# Patient Record
Sex: Male | Born: 1955 | Race: Black or African American | Hispanic: No | Marital: Single | Smoking: Former smoker
Health system: Southern US, Community
[De-identification: ages and names within clinical notes are randomized; demographics above are authoritative.]

## PROBLEM LIST (undated history)

## (undated) DIAGNOSIS — Z9989 Dependence on other enabling machines and devices: Secondary | ICD-10-CM

## (undated) DIAGNOSIS — G4730 Sleep apnea, unspecified: Secondary | ICD-10-CM

## (undated) DIAGNOSIS — E785 Hyperlipidemia, unspecified: Secondary | ICD-10-CM

## (undated) DIAGNOSIS — E1142 Type 2 diabetes mellitus with diabetic polyneuropathy: Secondary | ICD-10-CM

## (undated) DIAGNOSIS — M109 Gout, unspecified: Secondary | ICD-10-CM

## (undated) DIAGNOSIS — K219 Gastro-esophageal reflux disease without esophagitis: Secondary | ICD-10-CM

## (undated) DIAGNOSIS — I2510 Atherosclerotic heart disease of native coronary artery without angina pectoris: Secondary | ICD-10-CM

## (undated) DIAGNOSIS — I4891 Unspecified atrial fibrillation: Secondary | ICD-10-CM

## (undated) DIAGNOSIS — G4733 Obstructive sleep apnea (adult) (pediatric): Secondary | ICD-10-CM

## (undated) DIAGNOSIS — E119 Type 2 diabetes mellitus without complications: Secondary | ICD-10-CM

## (undated) DIAGNOSIS — I10 Essential (primary) hypertension: Secondary | ICD-10-CM

## (undated) HISTORY — DX: Atherosclerotic heart disease of native coronary artery without angina pectoris: I25.10

## (undated) HISTORY — DX: Hyperlipidemia, unspecified: E78.5

## (undated) HISTORY — DX: Type 2 diabetes mellitus with diabetic polyneuropathy: E11.42

## (undated) HISTORY — PX: CERVICAL DISC SURGERY: SHX588

## (undated) HISTORY — PX: NECK SURGERY: SHX720

## (undated) HISTORY — DX: Sleep apnea, unspecified: G47.30

## (undated) HISTORY — DX: Dependence on other enabling machines and devices: Z99.89

## (undated) HISTORY — PX: TONSILLECTOMY: SUR1361

## (undated) HISTORY — PX: ABLATION: SHX5711

## (undated) HISTORY — DX: Gout, unspecified: M10.9

## (undated) HISTORY — PX: COLONOSCOPY: SHX174

## (undated) HISTORY — PX: ESOPHAGOGASTRODUODENOSCOPY: SHX1529

## (undated) HISTORY — DX: Obstructive sleep apnea (adult) (pediatric): G47.33

## (undated) HISTORY — DX: Gastro-esophageal reflux disease without esophagitis: K21.9

---

## 2009-10-24 HISTORY — PX: AORTIC VALVE REPLACEMENT: SHX41

## 2016-05-02 DIAGNOSIS — Z952 Presence of prosthetic heart valve: Secondary | ICD-10-CM | POA: Insufficient documentation

## 2016-05-02 DIAGNOSIS — R0609 Other forms of dyspnea: Secondary | ICD-10-CM | POA: Insufficient documentation

## 2016-05-02 DIAGNOSIS — R600 Localized edema: Secondary | ICD-10-CM | POA: Insufficient documentation

## 2016-05-02 DIAGNOSIS — R0600 Dyspnea, unspecified: Secondary | ICD-10-CM | POA: Insufficient documentation

## 2016-06-02 DIAGNOSIS — Z8679 Personal history of other diseases of the circulatory system: Secondary | ICD-10-CM | POA: Insufficient documentation

## 2016-06-02 DIAGNOSIS — Z9989 Dependence on other enabling machines and devices: Secondary | ICD-10-CM | POA: Insufficient documentation

## 2016-06-02 DIAGNOSIS — E119 Type 2 diabetes mellitus without complications: Secondary | ICD-10-CM | POA: Insufficient documentation

## 2016-06-02 DIAGNOSIS — G4733 Obstructive sleep apnea (adult) (pediatric): Secondary | ICD-10-CM | POA: Insufficient documentation

## 2016-07-19 DIAGNOSIS — I483 Typical atrial flutter: Secondary | ICD-10-CM | POA: Insufficient documentation

## 2016-09-05 DIAGNOSIS — I10 Essential (primary) hypertension: Secondary | ICD-10-CM | POA: Insufficient documentation

## 2016-09-05 DIAGNOSIS — Z23 Encounter for immunization: Secondary | ICD-10-CM | POA: Insufficient documentation

## 2016-09-05 DIAGNOSIS — E7800 Pure hypercholesterolemia, unspecified: Secondary | ICD-10-CM | POA: Insufficient documentation

## 2016-09-05 DIAGNOSIS — Z0000 Encounter for general adult medical examination without abnormal findings: Secondary | ICD-10-CM | POA: Insufficient documentation

## 2016-10-09 DIAGNOSIS — R778 Other specified abnormalities of plasma proteins: Secondary | ICD-10-CM | POA: Insufficient documentation

## 2016-10-09 DIAGNOSIS — R7989 Other specified abnormal findings of blood chemistry: Secondary | ICD-10-CM

## 2016-10-10 DIAGNOSIS — I5021 Acute systolic (congestive) heart failure: Secondary | ICD-10-CM | POA: Insufficient documentation

## 2016-11-03 ENCOUNTER — Emergency Department (HOSPITAL_COMMUNITY): Payer: BLUE CROSS/BLUE SHIELD

## 2016-11-03 ENCOUNTER — Encounter (HOSPITAL_COMMUNITY): Payer: Self-pay

## 2016-11-03 DIAGNOSIS — Z87891 Personal history of nicotine dependence: Secondary | ICD-10-CM | POA: Diagnosis not present

## 2016-11-03 DIAGNOSIS — E119 Type 2 diabetes mellitus without complications: Secondary | ICD-10-CM | POA: Diagnosis not present

## 2016-11-03 DIAGNOSIS — Z7984 Long term (current) use of oral hypoglycemic drugs: Secondary | ICD-10-CM | POA: Diagnosis not present

## 2016-11-03 DIAGNOSIS — I10 Essential (primary) hypertension: Secondary | ICD-10-CM | POA: Insufficient documentation

## 2016-11-03 DIAGNOSIS — Z7901 Long term (current) use of anticoagulants: Secondary | ICD-10-CM | POA: Diagnosis not present

## 2016-11-03 DIAGNOSIS — Z79899 Other long term (current) drug therapy: Secondary | ICD-10-CM | POA: Insufficient documentation

## 2016-11-03 DIAGNOSIS — R0602 Shortness of breath: Secondary | ICD-10-CM | POA: Diagnosis not present

## 2016-11-03 LAB — CBC
HCT: 43.3 % (ref 39.0–52.0)
Hemoglobin: 14.9 g/dL (ref 13.0–17.0)
MCH: 30.2 pg (ref 26.0–34.0)
MCHC: 34.4 g/dL (ref 30.0–36.0)
MCV: 87.7 fL (ref 78.0–100.0)
Platelets: 270 10*3/uL (ref 150–400)
RBC: 4.94 MIL/uL (ref 4.22–5.81)
RDW: 13.2 % (ref 11.5–15.5)
WBC: 7 10*3/uL (ref 4.0–10.5)

## 2016-11-03 LAB — BASIC METABOLIC PANEL
Anion gap: 9 (ref 5–15)
BUN: 13 mg/dL (ref 6–20)
CO2: 24 mmol/L (ref 22–32)
Calcium: 9.3 mg/dL (ref 8.9–10.3)
Chloride: 106 mmol/L (ref 101–111)
Creatinine, Ser: 1.3 mg/dL — ABNORMAL HIGH (ref 0.61–1.24)
GFR calc Af Amer: >60 mL/min (ref >60)
GFR calc non Af Amer: 57 mL/min — ABNORMAL LOW (ref >60)
Glucose, Bld: 125 mg/dL — ABNORMAL HIGH (ref 65–99)
Potassium: 4.3 mmol/L (ref 3.5–5.1)
Sodium: 139 mmol/L (ref 135–145)

## 2016-11-03 LAB — I-STAT TROPONIN, ED: Troponin i, poc: 0.06 ng/mL (ref 0.00–0.08)

## 2016-11-03 NOTE — ED Triage Notes (Signed)
Pt reports SOB and lightheadedness that started yesterday, no LOC but reports he felt like he was going to pass out today around 2100 while walking. He states he has had "a lingering cough." Denies any pain.He reports recent ablation on Dec 15.

## 2016-11-04 ENCOUNTER — Emergency Department (HOSPITAL_COMMUNITY)
Admission: EM | Admit: 2016-11-04 | Discharge: 2016-11-04 | Disposition: A | Discharge status: Home / Self Care | Payer: BLUE CROSS/BLUE SHIELD | Attending: Emergency Medicine | Admitting: Emergency Medicine

## 2016-11-04 DIAGNOSIS — R0602 Shortness of breath: Secondary | ICD-10-CM

## 2016-11-04 HISTORY — DX: Unspecified atrial fibrillation: I48.91

## 2016-11-04 HISTORY — DX: Type 2 diabetes mellitus without complications: E11.9

## 2016-11-04 HISTORY — DX: Essential (primary) hypertension: I10

## 2016-11-04 LAB — URINALYSIS, ROUTINE W REFLEX MICROSCOPIC
Bilirubin Urine: NEGATIVE
Glucose, UA: NEGATIVE mg/dL
Hgb urine dipstick: NEGATIVE
Ketones, ur: NEGATIVE mg/dL
Leukocytes, UA: NEGATIVE
Nitrite: NEGATIVE
Protein, ur: NEGATIVE mg/dL
Specific Gravity, Urine: 1.019 (ref 1.005–1.030)
pH: 6 (ref 5.0–8.0)

## 2016-11-04 LAB — D-DIMER, QUANTITATIVE: D-Dimer, Quant: 0.31 ug/mL-FEU (ref 0.00–0.50)

## 2016-11-04 LAB — I-STAT TROPONIN, ED: Troponin i, poc: 0.06 ng/mL (ref 0.00–0.08)

## 2016-11-04 NOTE — ED Provider Notes (Signed)
[facility] DEPT Provider Note   CSN: [id number] Arrival date & time: 11/03/16  2159  By signing my name below, I, Delton Prairie, attest that this documentation has been prepared under the direction and in the presence of Jola Schmidt, MD  Electronically Signed: Delton Prairie, ED Scribe. 11/04/16. 4:14 AM.   History   Chief Complaint Chief Complaint  Patient presents with  . Shortness of Breath  . Near Syncope   The history is provided by the patient. No language interpreter was used.   HPI Comments:  Frank Berg is a 61 y.o. male, with a hx of a-fib, DM and HTN, who presents to the Emergency Department complaining of sudden onset, persistent SOB with exertion x 2 days. Pt also reports a persistent cough x 3 weeks, lightheadedness x 2 days and chest discomfort secondary to coughing. He states he has a cardiac ablation 3 weeks ago. Pt denies a hx of blood clot in his heart or lungs and leg swelling.   Past Medical History:  Diagnosis Date  . Atrial fibrillation ([place])   . Diabetes mellitus without complication ([place])   . Hypertension     There are no active problems to display for this patient.   Past Surgical History:  Procedure Laterality Date  . ABLATION    . AORTIC VALVE REPLACEMENT  2011  . NECK SURGERY     spinal stenosis       Home Medications    Prior to Admission medications   Medication Sig Start Date End Date Taking? Authorizing Provider  amiodarone (PACERONE) 200 MG tablet Take 200 mg by mouth 2 (two) times daily. 10/04/16  Yes Historical Provider, MD  doxycycline (VIBRA-TABS) 100 MG tablet Take 100 mg by mouth 2 (two) times daily. 10/24/16  Yes Historical Provider, MD  furosemide (LASIX) 20 MG tablet Take 20 mg by mouth daily. 10/10/16  Yes Historical Provider, MD  lansoprazole (PREVACID) 30 MG capsule Take 30 mg by mouth daily at 12 noon.   Yes Historical Provider, MD  magnesium oxide (MAG-OX) 400 (241.3 Mg) MG tablet Take 400 mg by mouth daily. 10/27/16   Yes Historical Provider, MD  metFORMIN (GLUCOPHAGE) 500 MG tablet Take 500 mg by mouth 2 (two) times daily with a meal.   Yes Historical Provider, MD  metoprolol succinate (TOPROL-XL) 50 MG 24 hr tablet Take 50 mg by mouth daily. 08/31/16  Yes Historical Provider, MD  potassium chloride (MICRO-K) 10 MEQ CR capsule Take 10 mEq by mouth 2 (two) times daily. 10/13/16  Yes Historical Provider, MD  PROAIR HFA 108 (90 Base) MCG/ACT inhaler Inhale 1-2 puffs into the lungs every 6 (six) hours as needed for wheezing.  10/24/16  Yes Historical Provider, MD  rosuvastatin (CRESTOR) 5 MG tablet Take 5 mg by mouth every evening. 08/26/16  Yes Historical Provider, MD  valsartan (DIOVAN) 80 MG tablet Take 80 mg by mouth daily. 08/24/16  Yes Historical Provider, MD  XARELTO 20 MG TABS tablet Take 20 mg by mouth every evening. 08/27/16  Yes Historical Provider, MD    Family History No family history on file.  Social History Social History  Substance Use Topics  . Smoking status: Former Smoker    Quit date: 11/03/1981  . Smokeless tobacco: Never Used  . Alcohol use Yes     Allergies   Penicillins   Review of Systems Review of Systems 10 systems reviewed and all are negative for acute change except as noted in the HPI.  Physical Exam Updated Vital  Signs BP (!) 164/107   Pulse (!) 51   Temp 98.1 F (36.7 C)   Resp 17   Ht 6\' 2"  (1.88 m)   Wt 250 lb (113.4 kg)   SpO2 96%   BMI 32.10 kg/m   Physical Exam  Constitutional: He is oriented to person, place, and time. He appears well-developed and well-nourished.  HENT:  Head: Normocephalic and atraumatic.  Eyes: EOM are normal.  Neck: Normal range of motion.  Cardiovascular: Normal rate, regular rhythm, normal heart sounds and intact distal pulses.   Pulmonary/Chest: Effort normal and breath sounds normal. No respiratory distress.  Abdominal: Soft. He exhibits no distension. There is no tenderness.  Musculoskeletal: Normal range of motion.    Neurological: He is alert and oriented to person, place, and time.  Skin: Skin is warm and dry.  Psychiatric: He has a normal mood and affect. Judgment normal.  Nursing note and vitals reviewed.    ED Treatments / Results  DIAGNOSTIC STUDIES:  Oxygen Saturation is 100% on RA, normal by my interpretation.    COORDINATION OF CARE:  4:13 AM Discussed treatment plan with pt at bedside and pt agreed to plan.  Labs (all labs ordered are listed, but only abnormal results are displayed) Labs Reviewed  BASIC METABOLIC PANEL - Abnormal; Notable for the following:       Result Value   Glucose, Bld 125 (*)    Creatinine, Ser 1.30 (*)    GFR calc non Af Amer 57 (*)    All other components within normal limits  CBC  URINALYSIS, ROUTINE W REFLEX MICROSCOPIC  D-DIMER, QUANTITATIVE (NOT AT [facility])  I-STAT TROPOININ, ED  I-STAT TROPOININ, ED  CBG MONITORING, ED    EKG  EKG Interpretation  Date/Time:  Friday November 04 2016 04:09:10 EST Ventricular Rate:  48 PR Interval:    QRS Duration: 156 QT Interval:  514 QTC Calculation: 460 R Axis:   55 Text Interpretation:  Sinus bradycardia Right bundle branch block No significant change was found Confirmed by Nixon Sparr  MD, Lennette Bihari ([id number]) on 11/04/2016 8:35:06 AM       Radiology Dg Chest 2 View  Result Date: 11/03/2016 CLINICAL DATA:  Acute onset of shortness of breath. Subacute onset of cough and congestion. Initial encounter. EXAM: CHEST  2 VIEW COMPARISON:  None. FINDINGS: The lungs are well-aerated and clear. There is no evidence of focal opacification, pleural effusion or pneumothorax. The heart is borderline enlarged. The patient is status post median sternotomy. An aortic valve replacement is noted. No acute osseous abnormalities are seen. IMPRESSION: Borderline cardiomegaly.  Lungs remain grossly clear. Electronically Signed   By: Garald Balding M.D.   On: 11/03/2016 22:56    Procedures Procedures (including critical care  time)  Medications Ordered in ED Medications - No data to display   Initial Impression / Assessment and Plan / ED Course  I have reviewed the triage vital signs and the nursing notes.  Pertinent labs & imaging results that were available during my care of the patient were reviewed by me and considered in my medical decision making (see chart for details).  Clinical Course     At time of discharge the patient feels much better.  EKG is without ischemic changes.  Troponin negative 2.  Ambulatory in the hall without any hypoxia.  D-dimer negative.  Overall well-appearing.  Outpatient primary care follow-up.  He understands to return to the ER for new or worsening symptoms.  This likely bronchitis.  Final  Clinical Impressions(s) / ED Diagnoses   Final diagnoses:  None    New Prescriptions Discharge Medication List as of 11/04/2016  5:28 AM     I personally performed the services described in this documentation, which was scribed in my presence. The recorded information has been reviewed and is accurate.        Jola Schmidt, MD 11/04/16 [phone]

## 2017-05-22 DIAGNOSIS — M109 Gout, unspecified: Secondary | ICD-10-CM | POA: Insufficient documentation

## 2017-05-22 DIAGNOSIS — E1142 Type 2 diabetes mellitus with diabetic polyneuropathy: Secondary | ICD-10-CM | POA: Insufficient documentation

## 2017-06-20 ENCOUNTER — Encounter: Payer: Self-pay | Admitting: Interventional Cardiology

## 2017-06-20 ENCOUNTER — Telehealth: Payer: Self-pay | Admitting: Interventional Cardiology

## 2017-06-20 ENCOUNTER — Ambulatory Visit (INDEPENDENT_AMBULATORY_CARE_PROVIDER_SITE_OTHER): Payer: BLUE CROSS/BLUE SHIELD | Admitting: Interventional Cardiology

## 2017-06-20 VITALS — BP 104/72 | HR 68 | Ht 74.0 in | Wt 246.2 lb

## 2017-06-20 DIAGNOSIS — E782 Mixed hyperlipidemia: Secondary | ICD-10-CM

## 2017-06-20 DIAGNOSIS — I2510 Atherosclerotic heart disease of native coronary artery without angina pectoris: Secondary | ICD-10-CM | POA: Insufficient documentation

## 2017-06-20 DIAGNOSIS — Z7901 Long term (current) use of anticoagulants: Secondary | ICD-10-CM

## 2017-06-20 DIAGNOSIS — I25119 Atherosclerotic heart disease of native coronary artery with unspecified angina pectoris: Secondary | ICD-10-CM | POA: Diagnosis not present

## 2017-06-20 DIAGNOSIS — E785 Hyperlipidemia, unspecified: Secondary | ICD-10-CM | POA: Insufficient documentation

## 2017-06-20 DIAGNOSIS — I480 Paroxysmal atrial fibrillation: Secondary | ICD-10-CM

## 2017-06-20 DIAGNOSIS — Z952 Presence of prosthetic heart valve: Secondary | ICD-10-CM

## 2017-06-20 NOTE — Telephone Encounter (Signed)
Release Of Information faxed to. 1.Dr.Michael Malinic's Office  2.[facility]

## 2017-06-20 NOTE — Patient Instructions (Signed)
Medication Instructions:  Your physician has recommended you make the following change in your medication:   1. STOP Aspirin  2. Continue Xarelto   Labwork: None ordered  Testing/Procedures: Your physician has requested that you have an echocardiogram. Echocardiography is a painless test that uses sound waves to create images of your heart. It provides your doctor with information about the size and shape of your heart and how well your heart's chambers and valves are working. This procedure takes approximately one hour. There are no restrictions for this procedure.   Follow-Up: Your physician wants you to follow-up in: 6 months with Dr. Irish Berg. You will receive a reminder letter in the mail two months in advance. If you don't receive a letter, please call our office to schedule the follow-up appointment.   Any Other Special Instructions Will Be Listed Below (If Applicable).  Echocardiogram An echocardiogram, or echocardiography, uses sound waves (ultrasound) to produce an image of your heart. The echocardiogram is simple, painless, obtained within a short period of time, and offers valuable information to your health care provider. The images from an echocardiogram can provide information such as:  Evidence of coronary artery disease (CAD).  Heart size.  Heart muscle function.  Heart valve function.  Aneurysm detection.  Evidence of a past heart attack.  Fluid buildup around the heart.  Heart muscle thickening.  Assess heart valve function.  Tell a health care provider about:  Any allergies you have.  All medicines you are taking, including vitamins, herbs, eye drops, creams, and over-the-counter medicines.  Any problems you or family members have had with anesthetic medicines.  Any blood disorders you have.  Any surgeries you have had.  Any medical conditions you have.  Whether you are pregnant or may be pregnant. What happens before the procedure? No  special preparation is needed. Eat and drink normally. What happens during the procedure?  In order to produce an image of your heart, gel will be applied to your chest and a wand-like tool (transducer) will be moved over your chest. The gel will help transmit the sound waves from the transducer. The sound waves will harmlessly bounce off your heart to allow the heart images to be captured in real-time motion. These images will then be recorded.  You may need an IV to receive a medicine that improves the quality of the pictures. What happens after the procedure? You may return to your normal schedule including diet, activities, and medicines, unless your health care provider tells you otherwise. This information is not intended to replace advice given to you by your health care provider. Make sure you discuss any questions you have with your health care provider. Document Released: 10/07/2000 Document Revised: 05/28/2016 Document Reviewed: 06/17/2013 Elsevier Interactive Patient Education  2017 [employer].    If you need a refill on your cardiac medications before your next appointment, please call your pharmacy.

## 2017-06-20 NOTE — Progress Notes (Signed)
Cardiology Office Note   Date:  06/20/2017   ID:  Frank Berg, DOB [birth year], MRN [id number]  PCP:  [name], MD    No chief complaint on file. AVR, CAD   Wt Readings from Last 3 Encounters:  06/20/17 246 lb 3.2 oz (111.7 kg)  11/03/16 250 lb (113.4 kg)       History of Present Illness: Frank Berg is a 61 y.o. male who is being seen today for the evaluation of AVR at the request of [place], Annie Main, MD.  He had a heart murmur diagnosed at age 8.  Presumably, this was from a bicuspid valve.    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  He takes SBE prophylaxis with doxycycline.  He had a stent placed in 2008.  He had a one vessel CABG at the time of valve replacement.  Chest burning was anginal sx.  He had AFib and subsequently had an AFib ablation in 12/17.  No recurrences of the AFib.   He recently moved to [facility] from [place] to be in a warmer place with lower taxes.  He has been on Xarelto since that time.  He is wondering whether or not he needs to take the Xarelto.  His sx with AFib were DOE.  He could not sense palpitatons.  He took amiodarone and metoprolol for a short time before the ablation.  Denies : Chest pain. Dizziness. Leg edema. Nitroglycerin use. Orthopnea. Palpitations. Paroxysmal nocturnal dyspnea. Shortness of breath. Syncope.   No bleeding problems.  No cath since heart surgery.      Past Medical History:  Diagnosis Date  . Atrial fibrillation ([place])   . Coronary artery disease   . Diabetes mellitus without complication ([place])   . Diabetic polyneuropathy ([place])   . Gout, unspecified   . Hypertension     Past Surgical History:  Procedure Laterality Date  . ABLATION    . AORTIC VALVE REPLACEMENT  2011  . NECK SURGERY     spinal stenosis     Current Outpatient Prescriptions  Medication Sig Dispense Refill  . aspirin EC 81 MG tablet Take 81 mg by mouth daily.    Marland Kitchen CIALIS 20 MG tablet Take 20 mg by mouth as  needed.    . doxycycline (VIBRA-TABS) 100 MG tablet Take 100 mg by mouth 2 (two) times daily.    . furosemide (LASIX) 20 MG tablet Take 20 mg by mouth daily.    . lansoprazole (PREVACID) 30 MG capsule Take 30 mg by mouth daily at 12 noon.    . magnesium oxide (MAG-OX) 400 (241.3 Mg) MG tablet Take 400 mg by mouth daily.  3  . metFORMIN (GLUCOPHAGE) 500 MG tablet Take 500 mg by mouth 2 (two) times daily with a meal.    . olmesartan (BENICAR) 20 MG tablet Take 20 mg by mouth daily.    . Omega-3 Fatty Acids (FISH OIL CONCENTRATE) 300 MG CAPS Take 1 tablet by mouth as needed.    . potassium chloride (MICRO-K) 10 MEQ CR capsule Take 10 mEq by mouth 2 (two) times daily.    . rosuvastatin (CRESTOR) 5 MG tablet Take 5 mg by mouth every evening.    . valsartan (DIOVAN) 80 MG tablet Take 80 mg by mouth daily.    Alveda Reasons 20 MG TABS tablet Take 20 mg by mouth every evening.     No current facility-administered medications for this visit.  Allergies:   Atorvastatin and Penicillins    Social History:  The patient  reports that he quit smoking about 35 years ago. He has never used smokeless tobacco. He reports that he drinks alcohol. He reports that he does not use drugs.   Family History:  The patient's family history includes Congenital heart disease in his mother; Heart attack in his father; Hypertension in his brother.    ROS:  Please see the history of present illness.   Otherwise, review of systems are positive for no bleeding issues, prior palpitations have resolved.   All other systems are reviewed and negative.    PHYSICAL EXAM: VS:  BP 104/72   Pulse 68   Ht 6\' 2"  (1.88 m)   Wt 246 lb 3.2 oz (111.7 kg)   SpO2 96%   BMI 31.61 kg/m  , BMI Body mass index is 31.61 kg/m. GEN: Well nourished, well developed, in no acute distress  HEENT: wearing sunglasses Neck: no JVD, carotid bruits, or masses Cardiac: RRR; 2/6 systolic murmur, no rubs, or gallops,no edema  Respiratory:  clear to  auscultation bilaterally, normal work of breathing GI: soft, nontender, nondistended, + BS MS: no deformity or atrophy  Skin: warm and dry, no rash Neuro:  Strength and sensation are intact Psych: euthymic mood, full affect   EKG:   The ekg ordered Jan 2018 demonstrates : SB, RBBB   Recent Labs: 11/03/2016: BUN 13; Creatinine, Ser 1.30; Hemoglobin 14.9; Platelets 270; Potassium 4.3; Sodium 139   Lipid Panel No results found for: CHOL, TRIG, HDL, CHOLHDL, VLDL, LDLCALC, LDLDIRECT   Other studies Reviewed: Additional studies/ records that were reviewed today with results demonstrating: July 2018 Cr 1.4, TG 395, LDL 77, HDL30, LFTs normal.   ASSESSMENT AND PLAN:  1. AVR: Valve appears to be functioning well. We'll check echocardiogram for baseline. 2. CAD: No angina at this time on medical therapy. Continue aggressive secondary prevention. No recent coronary events. Given that he is taking Xarelto, will stop aspirin. 3. Hyperlipidemia: TG elevated.  We spoke about improving diet.  COntiue Crestor. 4. Anticoagulated: No bleeding problems on Xarelto. 5. AFib: s/p ablation.  SVT listed on a monitor from 2016.  WIll try to pobtain ablation procedure report to know what was ablated.  Stop aspirin since he is on Xarelto.  He is comfortable staying on Xarelto at this time. We'll see what rhythm was treated with ablation. If it was not atrial fibrillation, could reconsider stopping anticoagulation. If it was atrial fibrillation, would consider a monitor for 30 days to ensure there is no occult atrial fibrillation, and then consider stopping anticoagulation.   Current medicines are reviewed at length with the patient today.  The patient concerns regarding his medicines were addressed.  The following changes have been made:  No change  Labs/ tests ordered today include:  No orders of the defined types were placed in this encounter.   Recommend 150 minutes/week of aerobic exercise Low fat,  low carb, high fiber diet recommended  Disposition:   FU in 6 months   Signed, Larae Grooms, MD  06/20/2017 [facility] [address], [place], [place]  [address] Phone: [phone]; Fax: [phone]

## 2017-06-21 ENCOUNTER — Ambulatory Visit (HOSPITAL_COMMUNITY): Payer: BLUE CROSS/BLUE SHIELD | Attending: Cardiology

## 2017-06-21 ENCOUNTER — Other Ambulatory Visit: Payer: Self-pay

## 2017-06-21 DIAGNOSIS — I371 Nonrheumatic pulmonary valve insufficiency: Secondary | ICD-10-CM | POA: Insufficient documentation

## 2017-06-21 DIAGNOSIS — I4891 Unspecified atrial fibrillation: Secondary | ICD-10-CM | POA: Diagnosis not present

## 2017-06-21 DIAGNOSIS — E119 Type 2 diabetes mellitus without complications: Secondary | ICD-10-CM | POA: Diagnosis not present

## 2017-06-21 DIAGNOSIS — I081 Rheumatic disorders of both mitral and tricuspid valves: Secondary | ICD-10-CM | POA: Diagnosis not present

## 2017-06-21 DIAGNOSIS — I2510 Atherosclerotic heart disease of native coronary artery without angina pectoris: Secondary | ICD-10-CM | POA: Diagnosis not present

## 2017-06-21 DIAGNOSIS — Z952 Presence of prosthetic heart valve: Secondary | ICD-10-CM | POA: Diagnosis not present

## 2017-06-21 DIAGNOSIS — I10 Essential (primary) hypertension: Secondary | ICD-10-CM | POA: Insufficient documentation

## 2017-06-28 ENCOUNTER — Telehealth: Payer: Self-pay | Admitting: Interventional Cardiology

## 2017-06-28 NOTE — Telephone Encounter (Signed)
Frank Berg is calling about his procedure in which he had on last week . He is wanting to discuss resuming his workout activities as well as his daily living activities . Please call

## 2017-06-28 NOTE — Telephone Encounter (Signed)
Left message for patient to call back  

## 2017-06-28 NOTE — Telephone Encounter (Signed)
OK to resume exercise.  I would stick to more cardio type exercises like walking swimming.  Weight lifting is ok as long as he is not having to bear down heavily while lifting.  Lighter weights where he does more reps, and breaths through the exercises are preferable.

## 2017-06-28 NOTE — Telephone Encounter (Signed)
Pt had echo last week and wanted to know when he can return to exercising or if he has any restrictions on activities/exercising based on the results of the echo?  Advised I would send message to Dr. Irish Lack for review and advisement.  Pt appreciative for call.

## 2017-06-29 NOTE — Telephone Encounter (Signed)
Patient made aware of Dr. Hassell Done recommendations. Patient verbalized understanding. Patient requesting a copy of his echocardiogram. Medical records printed report. Made patient aware that it would be left up front for him to pick up along with a release form that will need to be signed. Patient verbalized understanding and thanked me for the call.

## 2017-09-13 DIAGNOSIS — N529 Male erectile dysfunction, unspecified: Secondary | ICD-10-CM | POA: Insufficient documentation

## 2017-11-06 DIAGNOSIS — I7300 Raynaud's syndrome without gangrene: Secondary | ICD-10-CM | POA: Insufficient documentation

## 2017-12-11 ENCOUNTER — Encounter: Payer: Self-pay | Admitting: Podiatry

## 2017-12-11 ENCOUNTER — Other Ambulatory Visit: Payer: Self-pay | Admitting: Podiatry

## 2017-12-11 ENCOUNTER — Ambulatory Visit: Payer: BLUE CROSS/BLUE SHIELD | Admitting: Podiatry

## 2017-12-11 ENCOUNTER — Ambulatory Visit (INDEPENDENT_AMBULATORY_CARE_PROVIDER_SITE_OTHER): Payer: BLUE CROSS/BLUE SHIELD

## 2017-12-11 DIAGNOSIS — M79671 Pain in right foot: Secondary | ICD-10-CM | POA: Diagnosis not present

## 2017-12-11 DIAGNOSIS — M2041 Other hammer toe(s) (acquired), right foot: Secondary | ICD-10-CM

## 2017-12-11 DIAGNOSIS — M7751 Other enthesopathy of right foot: Secondary | ICD-10-CM

## 2017-12-11 DIAGNOSIS — M779 Enthesopathy, unspecified: Secondary | ICD-10-CM

## 2017-12-11 MED ORDER — TRIAMCINOLONE ACETONIDE 10 MG/ML IJ SUSP
10.0000 mg | Freq: Once | INTRAMUSCULAR | Status: AC
  Administered 2017-12-11: 10 mg

## 2017-12-11 NOTE — Progress Notes (Signed)
   Subjective:    Patient ID: Frank Berg, male    DOB: [birth year], 62 y.o.   MRN: [id number]  HPI    Review of Systems  All other systems reviewed and are negative.      Objective:   Physical Exam        Assessment & Plan:

## 2017-12-11 NOTE — Progress Notes (Signed)
Subjective:   Patient ID: Frank Berg, male   DOB: 62 y.o.   MRN: [id number]   HPI Patient presents with pain between the fourth and fifth toe of his right foot and states that it comes and goes and is not sure if it is a fungus.  States it gets very irritated he is tries different treatment options without relief of symptoms and patient currently does not smoke and likes to be active   Review of Systems  All other systems reviewed and are negative.       Objective:  Physical Exam  Constitutional: He appears well-developed and well-nourished.  Cardiovascular: Intact distal pulses.  Pulmonary/Chest: Effort normal.  Musculoskeletal: Normal range of motion.  Neurological: He is alert.  Skin: Skin is warm.  Nursing note and vitals reviewed.   Neurovascular status intact muscle strength adequate range of motion within normal limits with patient found to have keratotic lesion fourth interspace right that is painful when pressed and make shoe gear difficult.  Patient states this is been ongoing and that at times it can get very inflamed is never had infection.  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

## 2017-12-26 ENCOUNTER — Ambulatory Visit: Payer: BLUE CROSS/BLUE SHIELD | Admitting: Interventional Cardiology

## 2017-12-26 ENCOUNTER — Encounter: Payer: Self-pay | Admitting: Interventional Cardiology

## 2017-12-26 VITALS — BP 108/76 | HR 66 | Ht 74.0 in | Wt 224.2 lb

## 2017-12-26 DIAGNOSIS — E782 Mixed hyperlipidemia: Secondary | ICD-10-CM | POA: Diagnosis not present

## 2017-12-26 DIAGNOSIS — Z7901 Long term (current) use of anticoagulants: Secondary | ICD-10-CM

## 2017-12-26 DIAGNOSIS — I480 Paroxysmal atrial fibrillation: Secondary | ICD-10-CM | POA: Diagnosis not present

## 2017-12-26 DIAGNOSIS — I25119 Atherosclerotic heart disease of native coronary artery with unspecified angina pectoris: Secondary | ICD-10-CM | POA: Diagnosis not present

## 2017-12-26 DIAGNOSIS — Z952 Presence of prosthetic heart valve: Secondary | ICD-10-CM | POA: Diagnosis not present

## 2017-12-26 NOTE — Patient Instructions (Signed)
Medication Instructions:  Your physician recommends that you continue on your current medications as directed. Please refer to the Current Medication list given to you today.   Labwork: TODAY: CBC, BMET, MG  Testing/Procedures: None ordered  Follow-Up: Your physician wants you to follow-up in: 1 year with Dr. Irish Lack. You will receive a reminder letter in the mail two months in advance. If you don't receive a letter, please call our office to schedule the follow-up appointment.   Any Other Special Instructions Will Be Listed Below (If Applicable).     If you need a refill on your cardiac medications before your next appointment, please call your pharmacy.

## 2017-12-26 NOTE — Progress Notes (Signed)
Cardiology Office Note   Date:  12/26/2017   ID:  Frank Berg, DOB [birth year], MRN [id number]  PCP:  [name], MD    No chief complaint on file.  CAD/AVR  Wt Readings from Last 3 Encounters:  12/26/17 224 lb 3.2 oz (101.7 kg)  06/20/17 246 lb 3.2 oz (111.7 kg)  11/03/16 250 lb (113.4 kg)       History of Present Illness: Frank Berg is a 62 y.o. male with a history of aortic valve replacement in 2011.  He had a heart murmur diagnosed at age 2.  Presumably, this was from a bicuspid valve.    In 2011, he had an AVR at age 63.  It was a bovine valve.  He takes SBE prophylaxis with doxycycline.  He had a stent placed in 2008.  He had a one vessel CABG at the time of valve replacement.  Chest burning was anginal sx.  He had AFib and subsequently had an AFib ablation in 12/17.  No recurrences of the AFib.   He recently moved to [facility] from [place] to be in a warmer place with lower taxes.  He has been on Xarelto since that time.  He is wondering whether or not he needs to take the Xarelto.  His sx with AFib were DOE.  He could not sense palpitatons.  He took amiodarone and metoprolol for a short time before the ablation.  Note from [facility] 2018, Dr. Lubertha Basque states: "S/P cryoablation of arrhythmia  10/07/16: Cryoablation/PVI and RFA of baseline AFLutter Ablated to sinus Then transseptal to LA Isolated PVs   [name] is doing very well after ablation for atrial flutter and fibrillation, maintaining sinus rhythm. He had his amiodarone stopped, and continues to maintain sinus.   We also discussed anticoagulation it is unclear as to whether is ever safe to come off anticoagulation when the risk score suggests a patient with atrial fibrillation should be on, and since he has only been off amiodarone for a month or 2 we will keep him on anticoagulation for the short term. Maybe at the year mark if he has no more atrial fibrillation we can contemplate coming off  if he is willing to accept the risk of recurrent, asymptomatic atrial fibrillation."  He was wondering about stopping anticoagulation at the last visit.    He continues to exercise 5-6x/week.  He takes dance classes with his wife.   Denies : Chest pain. Dizziness. Leg edema. Nitroglycerin use. Orthopnea. Palpitations. Paroxysmal nocturnal dyspnea. Shortness of breath. Syncope.   He has lost weight intentionally.  No bleeding problems.       Past Medical History:  Diagnosis Date  . Atrial fibrillation ([place])   . Coronary artery disease   . Diabetes mellitus without complication ([place])   . Diabetic polyneuropathy ([place])   . Gout, unspecified   . Hypertension     Past Surgical History:  Procedure Laterality Date  . ABLATION    . AORTIC VALVE REPLACEMENT  2011  . NECK SURGERY     spinal stenosis     Current Outpatient Medications  Medication Sig Dispense Refill  . CIALIS 20 MG tablet Take 20 mg by mouth as needed.    . doxycycline (VIBRA-TABS) 100 MG tablet Take 100 mg by mouth as needed.     . furosemide (LASIX) 20 MG tablet Take 20 mg by mouth daily.    . lansoprazole (PREVACID) 30 MG capsule Take 30 mg by mouth as needed.     Marland Kitchen  magnesium oxide (MAG-OX) 400 (241.3 Mg) MG tablet Take 400 mg by mouth daily.  3  . metFORMIN (GLUCOPHAGE) 500 MG tablet Take 500 mg by mouth 2 (two) times daily with a meal.    . potassium chloride (MICRO-K) 10 MEQ CR capsule Take 10 mEq by mouth 2 (two) times daily.    . rosuvastatin (CRESTOR) 5 MG tablet Take 5 mg by mouth every evening.    Marland Kitchen telmisartan (MICARDIS) 40 MG tablet Take 40 mg by mouth daily.    Alveda Reasons 20 MG TABS tablet Take 20 mg by mouth every evening.     No current facility-administered medications for this visit.     Allergies:   Atorvastatin and Penicillins    Social History:  The patient  reports that he quit smoking about 36 years ago. he has never used smokeless tobacco. He reports that he drinks alcohol. He reports  that he does not use drugs.   Family History:  The patient's family history includes Congenital heart disease in his mother; Heart attack in his father; Hypertension in his brother.    ROS:  Please see the history of present illness.   Otherwise, review of systems are positive for intentional weight loss.   All other systems are reviewed and negative.    PHYSICAL EXAM: VS:  BP 108/76   Pulse 66   Ht 6\' 2"  (1.88 m)   Wt 224 lb 3.2 oz (101.7 kg)   SpO2 98%   BMI 28.79 kg/m  , BMI Body mass index is 28.79 kg/m. GEN: Well nourished, well developed, in no acute distress  HEENT: swollen right eye Neck: no JVD, carotid bruits, or masses Cardiac: RRR; no murmurs; crisp S2 , no rubs, or gallops,no edema  Respiratory:  clear to auscultation bilaterally, normal work of breathing GI: soft, nontender, nondistended, + BS MS: no deformity or atrophy  Skin: warm and dry, no rash Neuro:  Strength and sensation are intact Psych: euthymic mood, full affect   EKG:   The ekg ordered today demonstrates NSR, RBBB; no change from 2018   Recent Labs: No results found for requested labs within last 8760 hours.   Lipid Panel No results found for: CHOL, TRIG, HDL, CHOLHDL, VLDL, LDLCALC, LDLDIRECT   Other studies Reviewed: Additional studies/ records that were reviewed today with results demonstrating: prior records reviewed.   ASSESSMENT AND PLAN:  1. AVR: Continue SBE prophylaxis with dental procedures. . 2. CAD: No angina.  COntinue aggressive secondary prevention.  3. Hyperlipidemia: LDL 77.  Continue Crestor 4. Anticoagulated: Tolerating this well.  Continue anticoagulation.  5. AFib: s/p ablation.  Stopping anticoagulation was discussed with him by his MD in [place].  Will continue Xarelto.  He is in agreement.  6. DM: A1C 7.0 in November 2018.     Current medicines are reviewed at length with the patient today.  The patient concerns regarding his medicines were addressed.  The  following changes have been made:  No change  Labs/ tests ordered today include:  No orders of the defined types were placed in this encounter.   Recommend 150 minutes/week of aerobic exercise Low fat, low carb, high fiber diet recommended  Disposition:   FU in 1 year   Signed, Larae Grooms, MD  12/26/2017 10:46 AM    [facility] [address] [place], [place], [place]  [address] Phone: [phone]; Fax: [phone]

## 2017-12-27 LAB — CBC
Hematocrit: 45.6 % (ref 37.5–51.0)
Hemoglobin: 15.6 g/dL (ref 13.0–17.7)
MCH: 30.2 pg (ref 26.6–33.0)
MCHC: 34.2 g/dL (ref 31.5–35.7)
MCV: 88 fL (ref 79–97)
Platelets: 345 10*3/uL (ref 150–379)
RBC: 5.17 x10E6/uL (ref 4.14–5.80)
RDW: 14.8 % (ref 12.3–15.4)
WBC: 6.1 10*3/uL (ref 3.4–10.8)

## 2017-12-27 LAB — BASIC METABOLIC PANEL
BUN/Creatinine Ratio: 18 (ref 10–24)
BUN: 22 mg/dL (ref 8–27)
CO2: 26 mmol/L (ref 20–29)
Calcium: 10.5 mg/dL — ABNORMAL HIGH (ref 8.6–10.2)
Chloride: 97 mmol/L (ref 96–106)
Creatinine, Ser: 1.2 mg/dL (ref 0.76–1.27)
GFR calc Af Amer: 74 mL/min/{1.73_m2} (ref >59)
GFR calc non Af Amer: 64 mL/min/{1.73_m2} (ref >59)
Glucose: 97 mg/dL (ref 65–99)
Potassium: 4.7 mmol/L (ref 3.5–5.2)
Sodium: 136 mmol/L (ref 134–144)

## 2017-12-27 LAB — MAGNESIUM: Magnesium: 1.9 mg/dL (ref 1.6–2.3)

## 2018-02-14 ENCOUNTER — Encounter: Payer: Self-pay | Admitting: Podiatry

## 2018-02-14 ENCOUNTER — Ambulatory Visit: Payer: BLUE CROSS/BLUE SHIELD | Admitting: Podiatry

## 2018-02-14 DIAGNOSIS — M779 Enthesopathy, unspecified: Secondary | ICD-10-CM

## 2018-02-14 DIAGNOSIS — M7751 Other enthesopathy of right foot: Secondary | ICD-10-CM | POA: Diagnosis not present

## 2018-02-14 MED ORDER — TRIAMCINOLONE ACETONIDE 10 MG/ML IJ SUSP
10.0000 mg | Freq: Once | INTRAMUSCULAR | Status: AC
  Administered 2018-02-14: 10 mg

## 2018-02-15 NOTE — Progress Notes (Signed)
Subjective:   Patient ID: Frank Berg, male   DOB: 62 y.o.   MRN: [id number]   HPI Patient presents stating has developed pain in the back of the right heel and that it is hard for him to wear shoe gear comfortably.  Does not remember specific injury   ROS      Objective:  Physical Exam  Neurovascular status intact with moderate posterior aspect right heel lateral side with inflammation fluid buildup that is painful when palpated     Assessment:  Posterior Achilles tendinitis right lateral side     Plan:  Reviewed condition and recommend careful injection explaining risk of procedure.  Patient wants this in today I did careful injection of the posterior lateral aspect right heel 3 mg dextrose and Kenalog 5 mg Xylocaine after first discussing with him the chances of a rupture associated with injection treatment.  Patient will reduce activity level

## 2018-02-19 ENCOUNTER — Telehealth: Payer: Self-pay | Admitting: *Deleted

## 2018-02-19 NOTE — Telephone Encounter (Signed)
I spoke with pt and told him the injection contained a steroid kenalog and that would help with inflammation, but had to have a multi-layer approach to the inflammation, by rest, ice 3-4 times daily for 15-20 minutes protecting the skin from the ice with cloth, and decrease activity. I told pt I saw he was on xarelto,so he would have to speak with the physician that prescribed to see if he could use an antiinflammatory. I told pt he should make an appt for 10-14 days to be rechecked and transferred to schedulers.

## 2018-02-19 NOTE — Telephone Encounter (Signed)
Pt states he received an injection and he still has the inflammation, what was in the injection.

## 2018-03-01 ENCOUNTER — Ambulatory Visit: Payer: BLUE CROSS/BLUE SHIELD | Admitting: Podiatry

## 2018-04-04 ENCOUNTER — Telehealth: Payer: Self-pay | Admitting: Interventional Cardiology

## 2018-04-04 NOTE — Telephone Encounter (Signed)
New Message   Pt states that he does not want to be seen once a year, that he prefers being seen at least every 3-6 months. Please call

## 2018-04-05 NOTE — Telephone Encounter (Signed)
Returned call to patient who states that he thinks that he should be seen by Dr. Irish Lack sooner than a year. He states that his old MD use to see him every 3-6 months max. Asked patient if he was having any symptoms or concerns that he was needing to be seen sooner for. Patient denied having any issues at this time. Made patient aware that as long as he was feeling well that Dr. Irish Lack felt like he could come back in a year. Patient then states that he had some discomfort on his left side the other day that he had never had before. Patient denies having any SOB, palpitations, lightheadedness, dizziness, n/v, arm or jaw pain, or any other symptoms. He states that the discomfort was while he was at rest and went away when he moved a certain way. Made patient aware that this discomfort that he had did not sound cardiac in nature. Instructed the patient to let us know if he develops any symptoms and if his symptoms were related to his heart we could get him in to see PA or NP on Dr. Hassell Done team. Patient verbalized understanding and thanked me for the call.

## 2018-07-30 ENCOUNTER — Ambulatory Visit: Payer: BLUE CROSS/BLUE SHIELD | Admitting: Podiatry

## 2018-07-30 ENCOUNTER — Encounter: Payer: Self-pay | Admitting: Podiatry

## 2018-07-30 DIAGNOSIS — B351 Tinea unguium: Secondary | ICD-10-CM

## 2018-07-30 DIAGNOSIS — D689 Coagulation defect, unspecified: Secondary | ICD-10-CM

## 2018-07-30 DIAGNOSIS — M79674 Pain in right toe(s): Secondary | ICD-10-CM

## 2018-07-30 DIAGNOSIS — M79675 Pain in left toe(s): Secondary | ICD-10-CM

## 2018-07-30 DIAGNOSIS — E119 Type 2 diabetes mellitus without complications: Secondary | ICD-10-CM

## 2018-07-30 DIAGNOSIS — G510 Bell's palsy: Secondary | ICD-10-CM | POA: Insufficient documentation

## 2018-07-30 NOTE — Progress Notes (Signed)
Subjective:   Patient ID: Frank Berg, male   DOB: 62 y.o.   MRN: [id number]   HPI Patient presents with thick yellow nailbeds 1-5 both feet are incurvated in the corners and he cannot cut   ROS      Objective:  Physical Exam  Thick yellow brittle mycotic nail infection with pain 1-5 both feet     Assessment:  Chronic mycotic nail infection with pain 1-5 both feet     Plan:  Debride painful nailbeds 1-5 both feet with no iatrogenic bleeding noted

## 2018-08-07 ENCOUNTER — Telehealth: Payer: Self-pay

## 2018-08-07 DIAGNOSIS — I480 Paroxysmal atrial fibrillation: Secondary | ICD-10-CM

## 2018-08-07 NOTE — Telephone Encounter (Signed)
Pt had questions concerning his Rx Xarelto. Please address. Thank you.

## 2018-08-07 NOTE — Telephone Encounter (Signed)
Returned call to patient. Patient states that he has been wanting to come off of his Xarelto for some time now. He states that he had an Afib ablation in 2017 that was done in [place]. Patient states that he spoke with Dr. Lubertha Basque who did his ablation and he felt that he could come off of it but would consider ordering a monitor to assess for Afib. Patient denies having any palpitations, irregular heartbeats, any bleeding issues, or any other Sx. Made patient aware that I would forward to Dr. Irish Lack for review and recommendation.

## 2018-08-13 NOTE — Telephone Encounter (Signed)
WIll check with Dr. Rayann Heman regarding monitoring. Jeneen Rinks, This patient had ablation in 2017 in [place].  He wants to come off of Xarelto.  DId not feel severe palpitations when in AFib.  Would you consider monitoring with ILR?

## 2018-08-15 NOTE — Telephone Encounter (Signed)
I think that long term monitoring post ablation would be very reasonable for this patient.  His chads2vasc score is 3 and will be 4 in August when he turns 63.   I would advise that we should know his AF burden and continue to monitor this in consideration of stopping anticoagulation.   I would be very happy to see.  Thanks! JA

## 2018-08-17 NOTE — Telephone Encounter (Signed)
Left message for patient to call back  

## 2018-08-17 NOTE — Telephone Encounter (Signed)
Please schedule with Dr. Rayann Heman to discuss ILR before stopping Xarelto.

## 2018-08-21 NOTE — Telephone Encounter (Signed)
Left message for patient to call back  

## 2018-08-30 NOTE — Telephone Encounter (Signed)
Follow up  ° ° °Patient is returning your call. °

## 2018-08-30 NOTE — Telephone Encounter (Signed)
Returned call to patient. Made him aware that Dr. Irish Lack would like for him to see Dr. Rayann Heman to discuss ILR to assess Afib burden prior to stopping his Xarelto. Patient is in agreement. Referral placed. Patient scheduled to see Dr. Rayann Heman tomorrow at [place] by MT, EP scheduler.

## 2018-08-31 ENCOUNTER — Encounter: Payer: Self-pay | Admitting: Internal Medicine

## 2018-08-31 ENCOUNTER — Ambulatory Visit (INDEPENDENT_AMBULATORY_CARE_PROVIDER_SITE_OTHER): Payer: BLUE CROSS/BLUE SHIELD | Admitting: Internal Medicine

## 2018-08-31 VITALS — BP 106/70 | HR 66 | Ht 74.0 in | Wt 227.0 lb

## 2018-08-31 DIAGNOSIS — I25119 Atherosclerotic heart disease of native coronary artery with unspecified angina pectoris: Secondary | ICD-10-CM | POA: Diagnosis not present

## 2018-08-31 DIAGNOSIS — Z7901 Long term (current) use of anticoagulants: Secondary | ICD-10-CM | POA: Diagnosis not present

## 2018-08-31 DIAGNOSIS — Z952 Presence of prosthetic heart valve: Secondary | ICD-10-CM | POA: Diagnosis not present

## 2018-08-31 DIAGNOSIS — I480 Paroxysmal atrial fibrillation: Secondary | ICD-10-CM

## 2018-08-31 NOTE — Progress Notes (Signed)
Electrophysiology Office Note   Date:  08/31/2018   ID:  Frank Berg, DOB [birth year], MRN [id number]  PCP:  [name], MD  Cardiologist:  Dr Irish Lack Primary Electrophysiologist: [name], MD    CC: afib   History of Present Illness: Frank Berg is a 62 y.o. male who presents today for electrophysiology evaluation.   He is referred by Dr Irish Lack for further evaluation of atrial arrhythmias.  He has a h/o aortic valve  surgery with single vessel CABG in 2011.  He had afib subsequently and underwent afib ablation (Cryoballoon procedure at [facility] by Dr Lubertha Basque 10/07/2016.  He has since moved to [place] from [place].  He has been followed by Dr Okey Regal.   He has chads2vasc score of 3 and is on xarleto. He is unaware of afib.  Today, he denies symptoms of palpitations, chest pain, shortness of breath, orthopnea, PND, lower extremity edema, claudication, dizziness, presyncope, syncope, bleeding, or neurologic sequela. The patient is tolerating medications without difficulties and is otherwise without complaint today.    Past Medical History:  Diagnosis Date  . Atrial fibrillation ([place])   . Coronary artery disease   . Diabetes mellitus without complication ([place])   . Diabetic polyneuropathy ([place])   . Gout, unspecified   . Hypertension    Past Surgical History:  Procedure Laterality Date  . ABLATION    . AORTIC VALVE REPLACEMENT  2011  . NECK SURGERY     spinal stenosis     Current Outpatient Medications  Medication Sig Dispense Refill  . CIALIS 20 MG tablet Take 20 mg by mouth as needed.    . doxycycline (VIBRA-TABS) 100 MG tablet Take 100 mg by mouth as needed.     . furosemide (LASIX) 20 MG tablet Take 20 mg by mouth daily.    . lansoprazole (PREVACID) 30 MG capsule Take 30 mg by mouth as needed.     . magnesium oxide (MAG-OX) 400 (241.3 Mg) MG tablet Take 400 mg by mouth daily.  3  . metFORMIN (GLUCOPHAGE) 500 MG tablet Take 500 mg by mouth 2 (two)  times daily with a meal.    . potassium chloride (MICRO-K) 10 MEQ CR capsule Take 10 mEq by mouth 2 (two) times daily.    . rosuvastatin (CRESTOR) 5 MG tablet Take 5 mg by mouth every evening.    Marland Kitchen telmisartan (MICARDIS) 40 MG tablet Take 40 mg by mouth daily.    Alveda Reasons 20 MG TABS tablet Take 20 mg by mouth every evening.     No current facility-administered medications for this visit.     Allergies:   Atorvastatin and Penicillins   Social History:  The patient  reports that he quit smoking about 36 years ago. He has never used smokeless tobacco. He reports that he drinks alcohol. He reports that he does not use drugs.   Family History:  The patient's  family history includes Congenital heart disease in his mother; Heart attack in his father; Hypertension in his brother.    ROS:  Please see the history of present illness.   All other systems are personally reviewed and negative.    PHYSICAL EXAM: VS:  BP 106/70   Pulse 66   Ht 6\' 2"  (1.88 m)   Wt 227 lb (103 kg)   SpO2 98%   BMI 29.15 kg/m  , BMI Body mass index is 29.15 kg/m. GEN: Well nourished, well developed, in no acute distress  HEENT: normal  Neck: no JVD,  carotid bruits, or masses Cardiac: RRR; no murmurs, rubs, or gallops,no edema  Respiratory:  clear to auscultation bilaterally, normal work of breathing GI: soft, nontender, nondistended, + BS MS: no deformity or atrophy  Skin: warm and dry  Neuro:  Strength and sensation are intact Psych: euthymic mood, full affect  EKG:  EKG is ordered today. The ekg ordered today is personally reviewed and shows sinus rhythm 66 bmp, PR 176 msec, QRS 138 msec, Qtc 469 msec, RBBB   Recent Labs: 12/26/2017: BUN 22; Creatinine, Ser 1.20; Hemoglobin 15.6; Magnesium 1.9; Platelets 345; Potassium 4.7; Sodium 136  personally reviewed   Lipid Panel  No results found for: CHOL, TRIG, HDL, CHOLHDL, VLDL, LDLCALC, LDLDIRECT personally reviewed   Wt Readings from Last 3 Encounters:    08/31/18 227 lb (103 kg)  12/26/17 224 lb 3.2 oz (101.7 kg)  06/20/17 246 lb 3.2 oz (111.7 kg)      Other studies personally reviewed: Additional studies/ records that were reviewed today include: Dr Morton Amy notes  Review of the above records today demonstrates: as above   ASSESSMENT AND PLAN:  1.  Paroxysmal atrial fibrillation S/p cryoablation in [place] 2017.  No symptomatic afib since. His chads2vasc score is 3.  He inquires today about stopping anticoagulation. We discussed that AHA/ACC/HRS guidelines would say that he should continue long term anticoagulation.  We also discussed that there is in the guidelines allowance for monitoring post ablation with an implantable loop recorder.  Risks and benefits of ILR were discussed at length today.  I think that this would be a good option for him.  If he has no afib on ILR then we could stop anticoagualtion and then resume if ever AF was detected.  2. CAD No ischemic symptoms  3. HTN Stable No change required today  He will discuss ILR implantation with his wife and contact my office if he decides to proceed.  I told him that we could potentially implant his ILR in the office.  This would require insurance prior authorization prior to the procedure.  If he decides to not proceed with ILR, he will continue anticoagulation and follow-up with Dr Irish Lack and I will see as needed   Current medicines are reviewed at length with the patient today.   The patient does not have concerns regarding his medicines.  The following changes were made today:  none    Signed, [name], MD  08/31/2018 2:55 PM     [address] [place] [address] [phone] (office) [phone] (fax)

## 2018-08-31 NOTE — Patient Instructions (Addendum)
Medication Instructions:  Your physician recommends that you continue on your current medications as directed. Please refer to the Current Medication list given to you today.  If you need a refill on your cardiac medications before your next appointment, please call your pharmacy.   Lab work: None ordered  Testing/Procedures: None ordered  Follow-Up: No follow up is needed at this time with Dr. Rayann Heman.  Please call the office if you would like to schedule implantable loop recorder   Thank you for choosing [facility]!!    Any Other Special Instructions Will Be Listed Below (If Applicable).  Implantable Loop Recorder Placement An implantable loop recorder is a small electronic device that is placed under the skin of your chest. It is about the size of an AA ("double A") battery. The device records the electrical activity of your heart over a long period of time. Your health care provider can download these recordings to monitor your heart. You may need an implantable loop recorder if you have periods of abnormal heart activity (arrhythmias) or unexplained fainting (syncope) caused by a heart problem. Tell a health care provider about:  Any allergies you have.  All medicines you are taking, including vitamins, herbs, eye drops, creams, and over-the-counter medicines.  Any problems you or family members have had with anesthetic medicines.  Any blood disorders you have.  Any surgeries you have had.  Any medical conditions you have.  Whether you are pregnant or may be pregnant. What are the risks? Generally, this is a safe procedure. However, as with any procedure, problems may occur, including:  Infection.  Bleeding.  Allergic reactions to anesthetic medicines.  Damage to nerves or blood vessels.  Failure of the device to work. This could require another surgery to replace it.  What happens before the procedure?   You may have a physical exam, blood tests, and  imaging tests of your heart, such as a chest X-ray.  Follow instructions from your health care provider about eating or drinking restrictions.  Ask your health care provider about: ? Changing or stopping your regular medicines. This is especially important if you are taking diabetes medicines or blood thinners. ? Taking medicines such as aspirin and ibuprofen. These medicines can thin your blood. Do not take these medicines before your procedure if your surgeon instructs you not to.  Ask your health care provider how your surgical site will be marked or identified.  You may be given antibiotic medicine to help prevent infection.  Plan to have someone take you home after the procedure.  If you will be going home right after the procedure, plan to have someone with you for 24 hours.  Do not use any tobacco products, such as cigarettes, chewing tobacco, and e-cigarettes as told by your surgeon. If you need help quitting, ask your health care provider. What happens during the procedure?  To reduce your risk of infection: ? Your health care team will wash or sanitize their hands. ? Your skin will be washed with soap.  An IV tube will be inserted into one of your veins.  You may be given an antibiotic medicine through the IV tube.  You may be given one or more of the following: ? A medicine to help you relax (sedative). ? A medicine to numb the area (local anesthetic).  A small cut (incision) will be made on the left side of your upper chest.  A pocket will be created under your skin.  The device will be  placed in the pocket.  The incision will be closed with stitches (sutures) or adhesive strips.  A bandage (dressing) will be placed over the incision. The procedure may vary among health care providers and hospitals. What happens after the procedure?  Your blood pressure, heart rate, breathing rate, and blood oxygen level will be monitored often until the medicines you were given  have worn off.  You may be able to go home on the day of your surgery. Before going home: ? Your health care provider will program your recorder. ? You will learn how to trigger your device with a handheld activator. ? You will learn how to send recordings to your health care provider. ? You will get an ID card for your device, and you will be told when to use it.  Do not drive for 24 hours if you received a sedative. This information is not intended to replace advice given to you by your health care provider. Make sure you discuss any questions you have with your health care provider. Document Released: 09/21/2015 Document Revised: 03/17/2016 Document Reviewed: 07/15/2015 Elsevier Interactive Patient Education  [employer].

## 2018-09-10 DIAGNOSIS — E669 Obesity, unspecified: Secondary | ICD-10-CM | POA: Insufficient documentation

## 2018-10-08 ENCOUNTER — Ambulatory Visit: Payer: BLUE CROSS/BLUE SHIELD | Admitting: Podiatry

## 2018-12-12 ENCOUNTER — Other Ambulatory Visit: Payer: Self-pay | Admitting: Endocrinology

## 2018-12-12 DIAGNOSIS — R42 Dizziness and giddiness: Secondary | ICD-10-CM

## 2018-12-13 ENCOUNTER — Encounter: Payer: Self-pay | Admitting: Podiatry

## 2018-12-13 ENCOUNTER — Ambulatory Visit: Payer: BLUE CROSS/BLUE SHIELD | Admitting: Podiatry

## 2018-12-13 ENCOUNTER — Other Ambulatory Visit: Payer: Self-pay

## 2018-12-13 DIAGNOSIS — M79675 Pain in left toe(s): Secondary | ICD-10-CM

## 2018-12-13 DIAGNOSIS — M79674 Pain in right toe(s): Secondary | ICD-10-CM | POA: Diagnosis not present

## 2018-12-13 DIAGNOSIS — L84 Corns and callosities: Secondary | ICD-10-CM | POA: Diagnosis not present

## 2018-12-13 DIAGNOSIS — B351 Tinea unguium: Secondary | ICD-10-CM | POA: Diagnosis not present

## 2018-12-13 DIAGNOSIS — E1142 Type 2 diabetes mellitus with diabetic polyneuropathy: Secondary | ICD-10-CM | POA: Diagnosis not present

## 2018-12-13 NOTE — Progress Notes (Signed)
Subjective: Frank Berg presents with diabetic neuropathy and follow-up for painful, discolored, thick toenails which interfere with activities of daily living. Pain is aggravated when wearing enclosed shoe gear. Pain is relieved with periodic professional debridement.  [name], MD is his PCP.  Ms. Chhim is also on blood thinner, Xarelto.   Current Outpatient Medications:  .  CIALIS 20 MG tablet, Take 20 mg by mouth as needed., Disp: , Rfl:  .  colchicine 0.6 MG tablet, Take by mouth., Disp: , Rfl:  .  doxycycline (VIBRA-TABS) 100 MG tablet, Take 100 mg by mouth as needed. , Disp: , Rfl:  .  furosemide (LASIX) 20 MG tablet, Take 20 mg by mouth daily., Disp: , Rfl:  .  lansoprazole (PREVACID) 30 MG capsule, Take 30 mg by mouth as needed. , Disp: , Rfl:  .  magnesium oxide (MAG-OX) 400 (241.3 Mg) MG tablet, Take 400 mg by mouth daily., Disp: , Rfl: 3 .  magnesium oxide (MAG-OX) 400 MG tablet, Take by mouth., Disp: , Rfl:  .  metFORMIN (GLUCOPHAGE) 500 MG tablet, Take 500 mg by mouth 2 (two) times daily with a meal., Disp: , Rfl:  .  potassium chloride (MICRO-K) 10 MEQ CR capsule, Take 10 mEq by mouth 2 (two) times daily., Disp: , Rfl:  .  rosuvastatin (CRESTOR) 5 MG tablet, Take 5 mg by mouth every evening., Disp: , Rfl:  .  sildenafil (REVATIO) 20 MG tablet, , Disp: , Rfl:  .  tadalafil (CIALIS) 10 MG tablet, , Disp: , Rfl:  .  telmisartan (MICARDIS) 40 MG tablet, Take 40 mg by mouth daily., Disp: , Rfl:  .  XARELTO 20 MG TABS tablet, Take 20 mg by mouth every evening., Disp: , Rfl:   Allergies  Allergen Reactions  . Atorvastatin     Body aches  . Penicillins Swelling    Swollen face    Vascular Examination: Capillary refill time <3 seconds x 10 digits Dorsalis pedis and Posterior tibial pulses present b/l No digital hair x 10 digits Skin temperature within normal limits bilaterally.  Dermatological Examination: Skin with normal turgor, texture and tone b/l.     Toenails 1-5 left, digits 1, 2, 4, 5 right discolored, thick, dystrophic with subungual debris and pain with palpation to nailbeds due to thickness of nails.  Hyperkeratotic lesion submetatarsal head 3 bilaterally, submetatarsal head 4 right foot submetatarsal head 5 right foot.  Musculoskeletal: Muscle strength 5/5 to all LE muscle groups  Neurological: Sensation diminished with 10 gram monofilament. Vibratory sensation diminished  Assessment: 1. Painful onychomycosis toenails toenails 1-5 left, digits 1, 2, 4, 5 right  2. Calluses submetatarsal head 3 bilaterally, submetatarsal head 4 right foot submetatarsal head 5 right foot 3. NIDDM with Diabetic neuropathy  Plan: 1. Continue diabetic foot care principles.  Literature dispensed on today. 2. Toenails 1-5 b/l were debrided in length and girth without iatrogenic bleeding. 3. Hyperkeratotic lesion pared submetatarsal head 3 bilaterally, submetatarsal head 4 right foot submetatarsal head 5 right foot with sterile scalpel blade without incident. 4. Patient to continue soft, supportive shoe gear 5. Patient to report any pedal injuries to medical professional  6. Follow up 3 months.  7. Patient/POA to call should there be a concern in the interim.

## 2018-12-13 NOTE — Patient Instructions (Signed)

## 2018-12-24 ENCOUNTER — Ambulatory Visit
Admission: RE | Admit: 2018-12-24 | Discharge: 2018-12-24 | Disposition: A | Discharge status: Home / Self Care | Payer: BLUE CROSS/BLUE SHIELD | Source: Ambulatory Visit | Attending: Endocrinology | Admitting: Endocrinology

## 2018-12-24 DIAGNOSIS — R42 Dizziness and giddiness: Secondary | ICD-10-CM

## 2018-12-24 MED ORDER — GADOBENATE DIMEGLUMINE 529 MG/ML IV SOLN
20.0000 mL | Freq: Once | INTRAVENOUS | Status: AC | PRN
  Administered 2018-12-24: 20 mL via INTRAVENOUS

## 2018-12-27 ENCOUNTER — Encounter: Payer: Self-pay | Admitting: Neurology

## 2018-12-27 ENCOUNTER — Ambulatory Visit: Payer: BLUE CROSS/BLUE SHIELD | Admitting: Neurology

## 2018-12-27 VITALS — BP 126/84 | HR 76 | Ht 74.0 in | Wt 228.0 lb

## 2018-12-27 DIAGNOSIS — G4733 Obstructive sleep apnea (adult) (pediatric): Secondary | ICD-10-CM

## 2018-12-27 DIAGNOSIS — Z9989 Dependence on other enabling machines and devices: Secondary | ICD-10-CM | POA: Diagnosis not present

## 2018-12-27 DIAGNOSIS — I2510 Atherosclerotic heart disease of native coronary artery without angina pectoris: Secondary | ICD-10-CM | POA: Diagnosis not present

## 2018-12-27 DIAGNOSIS — I4891 Unspecified atrial fibrillation: Secondary | ICD-10-CM

## 2018-12-27 DIAGNOSIS — I2583 Coronary atherosclerosis due to lipid rich plaque: Secondary | ICD-10-CM

## 2018-12-27 NOTE — Progress Notes (Addendum)
SLEEP MEDICINE CLINIC   Provider:  Larey Seat, MD   Primary Care Physician:  [name], MD   Referring Provider: [name], MD    Chief Complaint  Patient presents with  . New Patient (Initial Visit)    pt alone, rm 11. pt presents today. had a sleep study 7-8 yrs ago. patient realized he forgot his machine when he was checking in. He uses a CPAP and is established with [facility], pt needing to establish care with a sleep MD. current machine is around 1-2 yrs old. pt is unsure of the settings. waiting to see if [facility] can tag [place] to the patient's machine. if not pt will bring back to complete a download.     HPI:  Frank Berg is a 63 y.o. male patient of Dr. Baldwin Crown with a history of sleep apnea diagnosed 7 years ago, he uses CPAP, he had been given a new machine one or two years ago.  Transfer of sleep care as patient moved here from [place]..  Chief complaint according to patient : Mr. Boomhower is just in need of transferring his sleep medicine care to a local provider, he has no trouble for CPAP use, he considers himself compliant, and he has benefited from CPAP therapy.  He endorsed here with an Epworth sleepiness score of 7 points.   Sleep habits are as follows: Dinnertime is between 5.30 and 6.30 fairly regularly, he mostly watches TV in the evening for entertainment, winding down.  He will go to bed around 11 PM- The bedroom is cool, quiet and dark, there is no TV in the bedroom.  He usually falls asleep with less than 20 minutes latency.  He sleeps supine or on his right. Has shoulder pain on the left.  Sleeps on multiple pillows for head and body positioning.  He will sleep between 4-6 hours in one stretch, before his first bathroom break. He sleeps again- for 2-3 hours.   He rises in the morning somewhere spontaneously between 8 and 9 Am , he feels refreshed and restored, has breakfast, coffee and goes to gym.    Sleep medical history: OSA diagnosed in  [place]. DM diagnosed 12 years ago, HTN medication controlled, weight controlled. CAD with MI and stent placement in 2011, aortic valve replacement. Bypass, 2016-17 cardiac ablation for atrial fib.  Used to be obese- 245- now 225 pounds.     Family sleep history: sister with OSA, many siblings snore.    Social history: practicing christian faith, is married for 30 plus years,retired Engineer, structural, came to [place] after his son lived her for years and was a Ship broker at [employer].  Seldomly drinking alcohol , quit smoking 35 years ago. Caffeine - coffee in AM.   KETO Diet -    Review of Systems: Out of a complete 14 system review, the patient complains of only the following symptoms, and all other reviewed systems are negative.   Epworth Sleepiness Score : 7/ 24  ,  Fatigue severity score:  18/ 63 ,  depression score: N?A    Social History   Socioeconomic History  . Marital status: Married    Spouse name: Not on file  . Number of children: Not on file  . Years of education: Not on file  . Highest education level: Not on file  Occupational History  . Not on file  Social Needs  . Financial resource strain: Not on file  . Food insecurity:    Worry:  Not on file    Inability: Not on file  . Transportation needs:    Medical: Not on file    Non-medical: Not on file  Tobacco Use  . Smoking status: Former Smoker    Last attempt to quit: 11/03/1981    Years since quitting: 37.1  . Smokeless tobacco: Never Used  Substance and Sexual Activity  . Alcohol use: Yes    Comment: occasional  . Drug use: No  . Sexual activity: Not on file  Lifestyle  . Physical activity:    Days per week: Not on file    Minutes per session: Not on file  . Stress: Not on file  Relationships  . Social connections:    Talks on phone: Not on file    Gets together: Not on file    Attends religious service: Not on file    Active member of club or organization: Not on file    Attends meetings of  clubs or organizations: Not on file    Relationship status: Not on file  . Intimate partner violence:    Fear of current or ex partner: Not on file    Emotionally abused: Not on file    Physically abused: Not on file    Forced sexual activity: Not on file  Other Topics Concern  . Not on file  Social History Narrative   Retired Engineer, structural from [place] History  Problem Relation Age of Onset  . Congenital heart disease Mother   . Heart attack Father   . Cancer Sister   . Hypertension Brother   . Pancreatic cancer Brother     Past Medical History:  Diagnosis Date  . Atrial fibrillation ([place])   . Coronary artery disease   . Diabetes mellitus without complication ([place])   . Diabetic polyneuropathy ([place])   . Gout, unspecified   . Hypertension   . OSA on CPAP     Past Surgical History:  Procedure Laterality Date  . ABLATION    . AORTIC VALVE REPLACEMENT  2011  . NECK SURGERY     spinal stenosis    Current Outpatient Medications  Medication Sig Dispense Refill  . CIALIS 20 MG tablet Take 20 mg by mouth as needed.    . colchicine 0.6 MG tablet Take by mouth.    . furosemide (LASIX) 20 MG tablet Take 20 mg by mouth daily.    . lansoprazole (PREVACID) 30 MG capsule Take 30 mg by mouth as needed.     . magnesium oxide (MAG-OX) 400 (241.3 Mg) MG tablet Take 400 mg by mouth daily.  3  . magnesium oxide (MAG-OX) 400 MG tablet Take by mouth.    . metFORMIN (GLUCOPHAGE) 500 MG tablet Take 500 mg by mouth 2 (two) times daily with a meal.    . potassium chloride (MICRO-K) 10 MEQ CR capsule Take 10 mEq by mouth 2 (two) times daily.    . rosuvastatin (CRESTOR) 5 MG tablet Take 5 mg by mouth every evening.    . sildenafil (REVATIO) 20 MG tablet     . tadalafil (CIALIS) 10 MG tablet     . telmisartan (MICARDIS) 40 MG tablet Take 40 mg by mouth daily.    Alveda Reasons 20 MG TABS tablet Take 20 mg by mouth every evening.     No current facility-administered medications for  this visit.     Allergies as of 12/27/2018 - Review Complete 12/27/2018  Allergen Reaction Noted  .  Atorvastatin    . Penicillins Swelling 11/03/2016    Vitals: BP 126/84   Pulse 76   Ht 6\' 2"  (1.88 m)   Wt 228 lb (103.4 kg)   BMI 29.27 kg/m  Last Weight:  Wt Readings from Last 1 Encounters:  12/27/18 228 lb (103.4 kg)   JIR:CVEL mass index is 29.27 kg/m.     Last Height:   Ht Readings from Last 1 Encounters:  12/27/18 6\' 2"  (1.88 m)    Physical exam:  General: The patient is awake, alert and appears not in acute distress. The patient is well groomed. Head: Normocephalic, atraumatic. Neck is supple. Mallampati: 2 - edentulous.  neck circumference:18" . Nasal airflow patent ,  Retrognathia is seen.  Cardiovascular:  Regular rate and rhythm, without  murmurs or carotid bruit, and without distended neck veins. Respiratory: Lungs are clear to auscultation. Skin:  Without evidence of edema, Trunk: BMI :  The patient's posture is erect.  Neurologic exam : The patient is awake and alert, oriented to place and time.   Memory subjective  described as intact.     Attention span & concentration ability appears normal.  Speech is fluent,  without  dysarthria, dysphonia or aphasia.  Mood and affect are appropriate.  Cranial nerves: Pupils are equal and briskly reactive to light. No  pallor or edema. Extraocular movements  in vertical and horizontal planes intact and without nystagmus. Visual fields by finger perimetry are intact. Hearing to finger rub intact.   Facial sensation intact to fine touch.  Facial motor strength is symmetric and tongue and uvula move midline. Shoulder shrug was symmetrical.   Motor exam:   Normal tone, muscle bulk and symmetric strength in all extremities.  Sensory:  Fine touch, pinprick and vibrationnormal.  Coordination: no changes in penmanship.  Finger-to-nose maneuver  normal without evidence of ataxia, dysmetria or tremor.  Gait and  station: Patient walks without assistive device  Stance is stable and normal.   Deep tendon reflexes: in the  upper and lower extremities are symmetric and intact. Babinski maneuver response is  downgoing.    Assessment:  After physical and neurologic examination, review of laboratory studies,  Personal review of imaging studies, reports of other /same  Imaging studies, results of polysomnography and / or neurophysiology testing and pre-existing records as far as provided in visit., my assessment is   1)  Patent with history of CAD , a fib and Bypass/ stent placement has lost weight, gained muscle mass, regular exercise, Keto diet. And HTN and DM well controlled.  Reportedly a compliant CPAP user, but did not bring machine to the visit.  DME is [facility].     The patient was advised of the nature of the diagnosed disorder , the treatment options and the  risks for general health and wellness arising from not treating the condition.   I spent more than 30 minutes of face to face time with the patient.  Greater than 50% of time was spent in counseling and coordination of care. We have discussed the diagnosis and differential and I answered the patient's questions.    Plan:  Treatment plan and additional workup :   We will take CPAP care over , but need to know the settings and compliance data.  He uses a nasal mask, Respironics model   I will addend my note when I have access to the information. Marland Kitchen   Larey Seat, MD 12/30/1015, 51:02 AM  Certified in Neurology by ABPN  Certified in [place] by [facility] [address] [place], [place] [address]

## 2018-12-27 NOTE — Patient Instructions (Signed)
CPAP and BPAP Information  CPAP and BPAP are methods of helping a person breathe with the use of air pressure. CPAP stands for "continuous positive airway pressure." BPAP stands for "bi-level positive airway pressure." In both methods, air is blown through your nose or mouth and into your air passages to help you breathe well.  CPAP and BPAP use different amounts of pressure to blow air. With CPAP, the amount of pressure stays the same while you breathe in and out. With BPAP, the amount of pressure is increased when you breathe in (inhale) so that you can take larger breaths. Your health care provider will recommend whether CPAP or BPAP would be more helpful for you.  Why are CPAP and BPAP treatments used?  CPAP or BPAP can be helpful if you have:  · Sleep apnea.  · Chronic obstructive pulmonary disease (COPD).  · Heart failure.  · Medical conditions that weaken the muscles of the chest including muscular dystrophy, or neurological diseases such as amyotrophic lateral sclerosis (ALS).  · Other problems that cause breathing to be weak, abnormal, or difficult.  CPAP is most commonly used for obstructive sleep apnea (OSA) to keep the airways from collapsing when the muscles relax during sleep.  How is CPAP or BPAP administered?  Both CPAP and BPAP are provided by a small machine with a flexible plastic tube that attaches to a plastic mask. You wear the mask. Air is blown through the mask into your nose or mouth. The amount of pressure that is used to blow the air can be adjusted on the machine. Your health care provider will determine the pressure setting that should be used based on your individual needs.  When should CPAP or BPAP be used?  In most cases, the mask only needs to be worn during sleep. Generally, the mask needs to be worn throughout the night and during any daytime naps. People with certain medical conditions may also need to wear the mask at other times when they are awake. Follow instructions from your  health care provider about when to use the machine.  What are some tips for using the mask?    · Because the mask needs to be snug, some people feel trapped or closed-in (claustrophobic) when first using the mask. If you feel this way, you may need to get used to the mask. One way to do this is by holding the mask loosely over your nose or mouth and then gradually applying the mask more snugly. You can also gradually increase the amount of time that you use the mask.  · Masks are available in various types and sizes. Some fit over your mouth and nose while others fit over just your nose. If your mask does not fit well, talk with your health care provider about getting a different one.  · If you are using a mask that fits over your nose and you tend to breathe through your mouth, a chin strap may be applied to help keep your mouth closed.  · The CPAP and BPAP machines have alarms that may sound if the mask comes off or develops a leak.  · If you have trouble with the mask, it is very important that you talk with your health care provider about finding a way to make the mask easier to tolerate. Do not stop using the mask. Stopping the use of the mask could have a negative impact on your health.  What are some tips for using the machine?  ·   Place your CPAP or BPAP machine on a secure table or stand near an electrical outlet.  · Know where the on/off switch is located on the machine.  · Follow instructions from your health care provider about how to set the pressure on your machine and when you should use it.  · Do not eat or drink while the CPAP or BPAP machine is on. Food or fluids could get pushed into your lungs by the pressure of the CPAP or BPAP.  · Do not smoke. Tobacco smoke residue can damage the machine.  · For home use, CPAP and BPAP machines can be rented or purchased through home health care companies. Many different brands of machines are available. Renting a machine before purchasing may help you find out  which particular machine works well for you.  · Keep the CPAP or BPAP machine and attachments clean. Ask your health care provider for specific instructions.  Get help right away if:  · You have redness or open areas around your nose or mouth where the mask fits.  · You have trouble using the CPAP or BPAP machine.  · You cannot tolerate wearing the CPAP or BPAP mask.  · You have pain, discomfort, and bloating in your abdomen.  Summary  · CPAP and BPAP are methods of helping a person breathe with the use of air pressure.  · Both CPAP and BPAP are provided by a small machine with a flexible plastic tube that attaches to a plastic mask.  · If you have trouble with the mask, it is very important that you talk with your health care provider about finding a way to make the mask easier to tolerate.  This information is not intended to replace advice given to you by your health care provider. Make sure you discuss any questions you have with your health care provider.  Document Released: 07/08/2004 Document Revised: 06/12/2018 Document Reviewed: 08/29/2016  Elsevier Interactive Patient Education © 2019 Elsevier Inc.

## 2019-03-14 ENCOUNTER — Ambulatory Visit: Payer: BLUE CROSS/BLUE SHIELD | Admitting: Podiatry

## 2019-03-27 ENCOUNTER — Encounter: Payer: Self-pay | Admitting: Podiatry

## 2019-03-27 ENCOUNTER — Other Ambulatory Visit: Payer: Self-pay

## 2019-03-27 ENCOUNTER — Ambulatory Visit: Payer: BLUE CROSS/BLUE SHIELD | Admitting: Podiatry

## 2019-03-27 VITALS — Temp 97.2°F

## 2019-03-27 DIAGNOSIS — L84 Corns and callosities: Secondary | ICD-10-CM

## 2019-03-27 DIAGNOSIS — B351 Tinea unguium: Secondary | ICD-10-CM

## 2019-03-27 DIAGNOSIS — M79675 Pain in left toe(s): Secondary | ICD-10-CM | POA: Diagnosis not present

## 2019-03-27 DIAGNOSIS — E1142 Type 2 diabetes mellitus with diabetic polyneuropathy: Secondary | ICD-10-CM

## 2019-03-27 DIAGNOSIS — M79674 Pain in right toe(s): Secondary | ICD-10-CM | POA: Diagnosis not present

## 2019-03-27 NOTE — Patient Instructions (Signed)
Diabetes Mellitus and Foot Care  Foot care is an important part of your health, especially when you have diabetes. Diabetes may cause you to have problems because of poor blood flow (circulation) to your feet and legs, which can cause your skin to:   Become thinner and drier.   Break more easily.   Heal more slowly.   Peel and crack.  You may also have nerve damage (neuropathy) in your legs and feet, causing decreased feeling in them. This means that you may not notice minor injuries to your feet that could lead to more serious problems. Noticing and addressing any potential problems early is the best way to prevent future foot problems.  How to care for your feet  Foot hygiene   Wash your feet daily with warm water and mild soap. Do not use hot water. Then, pat your feet and the areas between your toes until they are completely dry. Do not soak your feet as this can dry your skin.   Trim your toenails straight across. Do not dig under them or around the cuticle. File the edges of your nails with an emery board or nail file.   Apply a moisturizing lotion or petroleum jelly to the skin on your feet and to dry, brittle toenails. Use lotion that does not contain alcohol and is unscented. Do not apply lotion between your toes.  Shoes and socks   Wear clean socks or stockings every day. Make sure they are not too tight. Do not wear knee-high stockings since they may decrease blood flow to your legs.   Wear shoes that fit properly and have enough cushioning. Always look in your shoes before you put them on to be sure there are no objects inside.   To break in new shoes, wear them for just a few hours a day. This prevents injuries on your feet.  Wounds, scrapes, corns, and calluses   Check your feet daily for blisters, cuts, bruises, sores, and redness. If you cannot see the bottom of your feet, use a mirror or ask someone for help.   Do not cut corns or calluses or try to remove them with medicine.   If you  find a minor scrape, cut, or break in the skin on your feet, keep it and the skin around it clean and dry. You may clean these areas with mild soap and water. Do not clean the area with peroxide, alcohol, or iodine.   If you have a wound, scrape, corn, or callus on your foot, look at it several times a day to make sure it is healing and not infected. Check for:  ? Redness, swelling, or pain.  ? Fluid or blood.  ? Warmth.  ? Pus or a bad smell.  General instructions   Do not cross your legs. This may decrease blood flow to your feet.   Do not use heating pads or hot water bottles on your feet. They may burn your skin. If you have lost feeling in your feet or legs, you may not know this is happening until it is too late.   Protect your feet from hot and cold by wearing shoes, such as at the beach or on hot pavement.   Schedule a complete foot exam at least once a year (annually) or more often if you have foot problems. If you have foot problems, report any cuts, sores, or bruises to your health care provider immediately.  Contact a health care provider if:     You have a medical condition that increases your risk of infection and you have any cuts, sores, or bruises on your feet.   You have an injury that is not healing.   You have redness on your legs or feet.   You feel burning or tingling in your legs or feet.   You have pain or cramps in your legs and feet.   Your legs or feet are numb.   Your feet always feel cold.   You have pain around a toenail.  Get help right away if:   You have a wound, scrape, corn, or callus on your foot and:  ? You have pain, swelling, or redness that gets worse.  ? You have fluid or blood coming from the wound, scrape, corn, or callus.  ? Your wound, scrape, corn, or callus feels warm to the touch.  ? You have pus or a bad smell coming from the wound, scrape, corn, or callus.  ? You have a fever.  ? You have a red line going up your leg.  Summary   Check your feet every day  for cuts, sores, red spots, swelling, and blisters.   Moisturize feet and legs daily.   Wear shoes that fit properly and have enough cushioning.   If you have foot problems, report any cuts, sores, or bruises to your health care provider immediately.   Schedule a complete foot exam at least once a year (annually) or more often if you have foot problems.  This information is not intended to replace advice given to you by your health care provider. Make sure you discuss any questions you have with your health care provider.  Document Released: 10/07/2000 Document Revised: 11/22/2017 Document Reviewed: 11/11/2016  Elsevier Interactive Patient Education  2019 Elsevier Inc.

## 2019-03-31 NOTE — Progress Notes (Signed)
Subjective: Frank Berg is a 63 y.o. y.o. male who is on long term blood thinner Xarelto and presents today with painful, discolored, thick toenails which interfere with daily activities. Pain is aggravated when wearing enclosed shoe gear. Pain is relieved with periodic professional debridement.  Pt also presents with painful callus formation noted b/l.  Frank Bowen, MD is his PCP.    Current Outpatient Medications:  .  CIALIS 20 MG tablet, Take 20 mg by mouth as needed., Disp: , Rfl:  .  colchicine 0.6 MG tablet, Take by mouth., Disp: , Rfl:  .  furosemide (LASIX) 20 MG tablet, Take 20 mg by mouth daily., Disp: , Rfl:  .  lansoprazole (PREVACID) 30 MG capsule, Take 30 mg by mouth as needed. , Disp: , Rfl:  .  magnesium oxide (MAG-OX) 400 (241.3 Mg) MG tablet, Take 400 mg by mouth daily., Disp: , Rfl: 3 .  magnesium oxide (MAG-OX) 400 MG tablet, Take by mouth., Disp: , Rfl:  .  metFORMIN (GLUCOPHAGE) 500 MG tablet, Take 500 mg by mouth 2 (two) times daily with a meal., Disp: , Rfl:  .  potassium chloride (MICRO-K) 10 MEQ CR capsule, Take 10 mEq by mouth 2 (two) times daily., Disp: , Rfl:  .  rosuvastatin (CRESTOR) 5 MG tablet, Take 5 mg by mouth every evening., Disp: , Rfl:  .  sildenafil (REVATIO) 20 MG tablet, , Disp: , Rfl:  .  tadalafil (CIALIS) 10 MG tablet, , Disp: , Rfl:  .  telmisartan (MICARDIS) 40 MG tablet, Take 40 mg by mouth daily., Disp: , Rfl:  .  XARELTO 20 MG TABS tablet, Take 20 mg by mouth every evening., Disp: , Rfl:    Allergies  Allergen Reactions  . Atorvastatin     Body aches  . Penicillins Swelling    Swollen face     Objective: Vitals:   03/27/19 1216  Temp: (!) 97.2 F (36.2 C)    Vascular Examination: Capillary refill time <3 seconds x 10 digits.  Dorsalis pedis pulses present b/l.  Posterior tibial pulses present b/l.  No digital hair x 10 digits.  Skin temperature gradient WNL b/l.  Dermatological Examination: Skin with normal  turgor, texture and tone b/l.  Toenails 1-5 left foot and 1, 2, 4, 5 right foot discolored, thick, dystrophic with subungual debris and pain with palpation to nailbeds due to thickness of nails.  Hyperkeratotic lesions submet head 3 b/l, submet head 4 right foot and submet head 5 right foot. No erythema, no edema, no drainage, no flocculence noted.   Musculoskeletal: Muscle strength 5/5 to all LE muscle groups.  Neurological: Sensation diminished with 10 gram monofilament.  Vibratory sensation diminished.  Assessment: 1.  Painful onychomycosis toenails 1-5 left foot and 1, 2, 4, 5 right foot in patient on blood thinner.  2.  Calluses submet head 3 b/l, submet head 4 right foot and submet head 5 right foot  3.  NIDDM with neuropathy  Plan: 1. Toenails 1-5 left foot and 1, 2, 4, 5 right foot were debrided in length and girth without iatrogenic bleeding. 2. Hyperkeratotic lesion(s) submet head 3 b/l, submet head 4 right foot and submet head 5 right foot debrided utilizing sterile scalpel blade without incident.  3. Patient to continue soft, supportive shoe gear daily. 4. Patient to report any pedal injuries to medical professional immediately. 5. Avoid self trimming due to use of blood thinner. 6. Follow up 3 months. 7. Patient/POA to call should there be  a concern in the interim.

## 2019-04-08 ENCOUNTER — Telehealth: Payer: Self-pay | Admitting: *Deleted

## 2019-04-08 NOTE — Telephone Encounter (Signed)
Due to current COVID 19 pandemic, our office is severely reducing in office visits until further notice, in order to minimize the risk to our patients and healthcare providers. Pt stated he did not bring machine the last time, and was supposed to bring in this time.  I could not download his compliance report at this time.  DME [facility].  I will call him back once I get download.

## 2019-04-08 NOTE — Telephone Encounter (Signed)
Due to current COVID 19 pandemic, our office is severely reducing in office visits until further notice, in order to minimize the risk to our patients and healthcare providers. I attempted to get compliance download and unable due to [facility] (per Estill Bamberg was a transfer from another [place] center).  They need serial # of machine to be able to place in [place].  Pt made aware will come in to office at [place], relayed about screening process, needs to bring cpap machine).  He verbalized understanding. '

## 2019-04-09 ENCOUNTER — Encounter: Payer: Self-pay | Admitting: Adult Health

## 2019-04-10 ENCOUNTER — Other Ambulatory Visit: Payer: Self-pay

## 2019-04-10 ENCOUNTER — Encounter: Payer: Self-pay | Admitting: Adult Health

## 2019-04-10 ENCOUNTER — Ambulatory Visit (INDEPENDENT_AMBULATORY_CARE_PROVIDER_SITE_OTHER): Payer: BLUE CROSS/BLUE SHIELD | Admitting: Adult Health

## 2019-04-10 VITALS — BP 111/74 | HR 72 | Temp 98.0°F | Ht 74.0 in | Wt 230.4 lb

## 2019-04-10 DIAGNOSIS — Z9989 Dependence on other enabling machines and devices: Secondary | ICD-10-CM

## 2019-04-10 DIAGNOSIS — G4733 Obstructive sleep apnea (adult) (pediatric): Secondary | ICD-10-CM

## 2019-04-10 NOTE — Progress Notes (Signed)
PATIENT: Frank Berg DOB: Mar 06, 1956  REASON FOR VISIT: follow up HISTORY FROM: patient  HISTORY OF PRESENT ILLNESS: Today 04/10/19:  Frank Berg is a 63 year old male with a history of obstructive sleep apnea on CPAP.  His CPAP download indicates that he uses his machine every night for compliance of 100%.  He uses machine greater than 4 hours each night.  On average he uses his machine 8 hours and 51 minutes.  His residual AHI is 4.2 on 8 to 16 cm of water with EPR of 3.  His leak in the 95th percentile is 23.  He is currently wearing the nasal mask.  States that occasionally he feels the mask leaking but typically is able to readjust.  Overall he feels that he is doing well.  He returns today for follow-up.  HISTORY (Copied from Dr.Dohmeier's note) 12/27/18  Frank Berg is a 63 y.o. male patient of Dr. Baldwin Crown with a history of sleep apnea diagnosed 7 years ago, he uses CPAP, he had been given a new machine one or two years ago.  Transfer of sleep care as patient moved here from [place]..  Chief complaint according to patient : Frank Berg is just in need of transferring his sleep medicine care to a local provider, he has no trouble for CPAP use, he considers himself compliant, and he has benefited from CPAP therapy.  He endorsed here with an Epworth sleepiness score of 7 points.   Sleep habits are as follows: Dinnertime is between 5.30 and 6.30 fairly regularly, he mostly watches TV in the evening for entertainment, winding down.  He will go to bed around 11 PM- The bedroom is cool, quiet and dark, there is no TV in the bedroom.  He usually falls asleep with less than 20 minutes latency.  He sleeps supine or on his right. Has shoulder pain on the left.  Sleeps on multiple pillows for head and body positioning.  He will sleep between 4-6 hours in one stretch, before his first bathroom break. He sleeps again- for 2-3 hours.   He rises in the morning somewhere spontaneously  between 8 and 9 Am , he feels refreshed and restored, has breakfast, coffee and goes to gym.    Sleep medical history: OSA diagnosed in [place]. DM diagnosed 12 years ago, HTN medication controlled, weight controlled. CAD with MI and stent placement in 2011, aortic valve replacement. Bypass, 2016-17 cardiac ablation for atrial fib.  Used to be obese- 245- now 225 pounds.     Family sleep history: sister with OSA, many siblings snore.    Social history: practicing christian faith, is married for 30 plus years,retired Engineer, structural, came to [place] after his son lived her for years and was a Ship broker at [employer].  Seldomly drinking alcohol , quit smoking 35 years ago. Caffeine - coffee in AM.   REVIEW OF SYSTEMS: Out of a complete 14 system review of symptoms, the patient complains only of the following symptoms, and all other reviewed systems are negative.  See HPI  ALLERGIES: Allergies  Allergen Reactions  . Atorvastatin     Body aches  . Penicillins Swelling    Swollen face    HOME MEDICATIONS: Outpatient Medications Prior to Visit  Medication Sig Dispense Refill  . CIALIS 20 MG tablet Take 20 mg by mouth as needed.    . colchicine 0.6 MG tablet Take by mouth.    . furosemide (LASIX) 20 MG tablet Take 20 mg by  mouth daily.    . lansoprazole (PREVACID) 30 MG capsule Take 30 mg by mouth as needed.     . magnesium oxide (MAG-OX) 400 (241.3 Mg) MG tablet Take 400 mg by mouth daily.  3  . magnesium oxide (MAG-OX) 400 MG tablet Take by mouth.    . metFORMIN (GLUCOPHAGE) 500 MG tablet Take 500 mg by mouth 2 (two) times daily with a meal.    . potassium chloride (MICRO-K) 10 MEQ CR capsule Take 10 mEq by mouth 2 (two) times daily.    . rosuvastatin (CRESTOR) 5 MG tablet Take 5 mg by mouth every evening.    . sildenafil (REVATIO) 20 MG tablet     . tadalafil (CIALIS) 10 MG tablet     . telmisartan (MICARDIS) 40 MG tablet Take 40 mg by mouth daily.    Alveda Reasons 20 MG TABS  tablet Take 20 mg by mouth every evening.     No facility-administered medications prior to visit.     PAST MEDICAL HISTORY: Past Medical History:  Diagnosis Date  . Atrial fibrillation ([place])   . Coronary artery disease   . Diabetes mellitus without complication ([place])   . Diabetic polyneuropathy ([place])   . Gout, unspecified   . Hypertension   . OSA on CPAP     PAST SURGICAL HISTORY: Past Surgical History:  Procedure Laterality Date  . ABLATION    . AORTIC VALVE REPLACEMENT  2011  . NECK SURGERY     spinal stenosis    FAMILY HISTORY: Family History  Problem Relation Age of Onset  . Congenital heart disease Mother   . Heart attack Father   . Cancer Sister   . Hypertension Brother   . Pancreatic cancer Brother     SOCIAL HISTORY: Social History   Socioeconomic History  . Marital status: Married    Spouse name: Not on file  . Number of children: Not on file  . Years of education: Not on file  . Highest education level: Not on file  Occupational History  . Not on file  Social Needs  . Financial resource strain: Not on file  . Food insecurity    Worry: Not on file    Inability: Not on file  . Transportation needs    Medical: Not on file    Non-medical: Not on file  Tobacco Use  . Smoking status: Former Smoker    Quit date: 11/03/1981    Years since quitting: 37.4  . Smokeless tobacco: Never Used  Substance and Sexual Activity  . Alcohol use: Yes    Comment: occasional  . Drug use: No  . Sexual activity: Not on file  Lifestyle  . Physical activity    Days per week: Not on file    Minutes per session: Not on file  . Stress: Not on file  Relationships  . Social Herbalist on phone: Not on file    Gets together: Not on file    Attends religious service: Not on file    Active member of club or organization: Not on file    Attends meetings of clubs or organizations: Not on file    Relationship status: Not on file  . Intimate partner violence     Fear of current or ex partner: Not on file    Emotionally abused: Not on file    Physically abused: Not on file    Forced sexual activity: Not on file  Other Topics Concern  .  Not on file  Social History Narrative   Retired Engineer, structural from [place]:   04/10/19 0859  BP: 111/74  Pulse: 72  Temp: 98 F (36.7 C)  TempSrc: Oral  Weight: 230 lb 6.4 oz (104.5 kg)  Height: 6\' 2"  (1.88 m)   Body mass index is 29.58 kg/m.  Generalized: Well developed, in no acute distress  CHEST: Lungs Clear to auscultation  Neurological examination  Mentation: Alert oriented to time, place, history taking. Follows all commands speech and language fluent Cranial nerve II-XII: Pupils were equal round reactive to light. Extraocular movements were full, visual field were full on confrontational test. Facial sensation and strength were normal. Uvula tongue midline. Head turning and shoulder shrug  were normal and symmetric. Motor: The motor testing reveals 5 over 5 strength of all 4 extremities. Good symmetric motor tone is noted throughout.  Sensory: Sensory testing is intact to soft touch on all 4 extremities. No evidence of extinction is noted.  Coordination: Cerebellar testing reveals good finger-nose-finger and heel-to-shin bilaterally.  Gait and station: Gait is normal.  Reflexes: Deep tendon reflexes are symmetric and normal bilaterally.   DIAGNOSTIC DATA (LABS, IMAGING, TESTING) - I reviewed patient records, labs, notes, testing and imaging myself where available.  Lab Results  Component Value Date   WBC 6.1 12/26/2017   HGB 15.6 12/26/2017   HCT 45.6 12/26/2017   MCV 88 12/26/2017   PLT 345 12/26/2017      Component Value Date/Time   NA 136 12/26/2017 1106   K 4.7 12/26/2017 1106   CL 97 12/26/2017 1106   CO2 26 12/26/2017 1106   GLUCOSE 97 12/26/2017 1106   GLUCOSE 125 (H) 11/03/2016 2225   BUN 22 12/26/2017 1106   CREATININE 1.20 12/26/2017  1106   CALCIUM 10.5 (H) 12/26/2017 1106   GFRNONAA 64 12/26/2017 1106   GFRAA 74 12/26/2017 1106      ASSESSMENT AND PLAN 63 y.o. year old male  has a past medical history of Atrial fibrillation ([place]), Coronary artery disease, Diabetes mellitus without complication ([place]), Diabetic polyneuropathy ([place]), Gout, unspecified, Hypertension, and OSA on CPAP. here with:  1.  Obstructive sleep apnea on CPAP  The patient CPAP download shows excellent compliance and good treatment of his apnea.  He is encouraged to continue using the CPAP nightly and greater than 4 hours each night.  He is advised that if his mask continues to leak he should let us know.  We could send him for mask refitting.  He will follow-up in 1 year or sooner if needed.   I spent 15 minutes with the patient. 50% of this time was spent reviewing his CPAP download.   Ward Givens, MSN, NP-C 04/10/2019, 9:24 AM [facility] [address] [place], [place] [address] [phone]

## 2019-04-10 NOTE — Patient Instructions (Signed)
Continue using CPAP nightly and greater than 4 hours each night °If your symptoms worsen or you develop new symptoms please let us know.  ° °

## 2019-06-25 ENCOUNTER — Ambulatory Visit: Payer: Self-pay | Admitting: Podiatry

## 2019-06-25 ENCOUNTER — Other Ambulatory Visit: Payer: Self-pay

## 2019-06-25 ENCOUNTER — Encounter: Payer: Self-pay | Admitting: Podiatry

## 2019-06-25 DIAGNOSIS — M79674 Pain in right toe(s): Secondary | ICD-10-CM | POA: Diagnosis not present

## 2019-06-25 DIAGNOSIS — M79675 Pain in left toe(s): Secondary | ICD-10-CM

## 2019-06-25 DIAGNOSIS — L84 Corns and callosities: Secondary | ICD-10-CM | POA: Diagnosis not present

## 2019-06-25 DIAGNOSIS — E1142 Type 2 diabetes mellitus with diabetic polyneuropathy: Secondary | ICD-10-CM | POA: Diagnosis not present

## 2019-06-25 DIAGNOSIS — B351 Tinea unguium: Secondary | ICD-10-CM | POA: Diagnosis not present

## 2019-06-25 NOTE — Patient Instructions (Addendum)

## 2019-06-27 NOTE — Progress Notes (Signed)
Cardiology Office Note   Date:  06/28/2019   ID:  Frank Berg, DOB 29-Sep-1956, MRN [id number]  PCP:  [name], MD    No chief complaint on file.  AVR  Wt Readings from Last 3 Encounters:  06/28/19 227 lb (103 kg)  04/10/19 230 lb 6.4 oz (104.5 kg)  12/27/18 228 lb (103.4 kg)       History of Present Illness: Frank Berg is a 63 y.o. male  with a history of aortic valve replacement in 2011.  He had a heart murmur diagnosed at age 75. Presumably, this was from a bicuspid valve.   In 2011, he had an AVR at age 88. It was a bovine valve. He takes SBE prophylaxis with doxycycline. He had a stent placed in 2008. He had a one vessel CABG at the time of valve replacement. Chest burning was anginal sx.  He had AFib and subsequently had an AFib ablation in 12/17. No recurrences of the AFib.   He recently moved to [facility] from [place] to be in a warmer place with lower taxes.  He has been on Xarelto since that time. He is wondering whether or not he needs to take the Xarelto.  His sx with AFib were DOE. He could not sense palpitatons. He took amiodarone and metoprolol for a short time before the ablation.  Note from [facility] 2018, Dr. Lubertha Basque states: "S/P cryoablation of arrhythmia  10/07/16: Cryoablation/PVI and RFA of baseline AFLutter Ablated to sinus Then transseptal to LA Isolated PVs   [name] is doing very well after ablation for atrial flutter and fibrillation, maintaining sinus rhythm. He had his amiodarone stopped, and continues to maintain sinus.  We also discussed anticoagulation it is unclear as to whether is ever safe to come off anticoagulation when the risk score suggests a patient with atrial fibrillation should be on, and since he has only been off amiodarone for a month or 2 we will keep him on anticoagulation for the short term. Maybe at the year mark if he has no more atrial fibrillation we can contemplate coming off if he is  willing to accept the risk of recurrent, asymptomatic atrial fibrillation."  He was wondering about stopping anticoagulation at the last visit.   We decided to continue anticoagulation at the last visit.   Since the last visit, he has felt well.  He is adjusting to COVID 19 life.  He is disappointed that he can't travel.  His son is in ICU with [facility].  He will start Remdesivir  Denies : Chest pain. Dizziness. Leg edema. Nitroglycerin use. Orthopnea. Palpitations. Paroxysmal nocturnal dyspnea. Shortness of breath. Syncope.   2 mile walk regularly without issues.       Past Medical History:  Diagnosis Date  . Atrial fibrillation ([place])   . Coronary artery disease   . Diabetes mellitus without complication ([place])   . Diabetic polyneuropathy ([place])   . Gout, unspecified   . Hypertension   . OSA on CPAP     Past Surgical History:  Procedure Laterality Date  . ABLATION    . AORTIC VALVE REPLACEMENT  2011  . NECK SURGERY     spinal stenosis     Current Outpatient Medications  Medication Sig Dispense Refill  . furosemide (LASIX) 20 MG tablet Take 20 mg by mouth daily.    . lansoprazole (PREVACID) 30 MG capsule Take 30 mg by mouth as needed.     . magnesium oxide (MAG-OX) 400 (241.3 Mg)  MG tablet Take 400 mg by mouth daily.  3  . metFORMIN (GLUCOPHAGE) 500 MG tablet Take 500 mg by mouth 2 (two) times daily with a meal.    . Omega-3 Fatty Acids (FISH OIL) 1200 MG CPDR Take by mouth daily.    . potassium chloride (MICRO-K) 10 MEQ CR capsule Take 10 mEq by mouth 2 (two) times daily.    . rosuvastatin (CRESTOR) 5 MG tablet Take 5 mg by mouth every evening.    Marland Kitchen telmisartan (MICARDIS) 40 MG tablet Take 40 mg by mouth daily.    Alveda Reasons 20 MG TABS tablet Take 20 mg by mouth every evening.     No current facility-administered medications for this visit.     Allergies:   Atorvastatin and Penicillins    Social History:  The patient  reports that he quit smoking about 37 years  ago. He has never used smokeless tobacco. He reports current alcohol use. He reports that he does not use drugs.   Family History:  The patient's family history includes Cancer in his sister; Congenital heart disease in his mother; Heart attack in his father; Hypertension in his brother; Pancreatic cancer in his brother.    ROS:  Please see the history of present illness.   Otherwise, review of systems are positive for stress from son's illness.   All other systems are reviewed and negative.    PHYSICAL EXAM: VS:  BP 102/64   Pulse 62   Ht 6\' 2"  (1.88 m)   Wt 227 lb (103 kg)   SpO2 99%   BMI 29.15 kg/m  , BMI Body mass index is 29.15 kg/m. GEN: Well nourished, well developed, in no acute distress  HEENT: normal  Neck: no JVD, carotid bruits, or masses Cardiac: RRR; 2/6 mid systolic murmur,; rubs, or gallops,no edema  Respiratory:  clear to auscultation bilaterally, normal work of breathing GI: soft, nontender, nondistended, + BS MS: no deformity or atrophy  Skin: warm and dry, no rash Neuro:  Strength and sensation are intact Psych: euthymic mood, full affect   EKG:   The ekg ordered today demonstrates NSR, RBBB , no change since prior   Recent Labs: No results found for requested labs within last 8760 hours.   Lipid Panel No results found for: CHOL, TRIG, HDL, CHOLHDL, VLDL, LDLCALC, LDLDIRECT   Other studies Reviewed: Additional studies/ records that were reviewed today with results demonstrating: labs reviewed . 2018 echo: Normal LV function. Aortic valve prosthetic valve appears to be functioning normally. Moderate TR with mild pulm HTN, all functioning adequately. No intervention required..  ASSESSMENT AND PLAN:  1. AVR: No syncope.  Valve appears to be functioning well.  2. CAD: No angina.  COntinue aggressive secondary prevention.   3. Hyperlipidemia:LDL 82.  Continue atorvastatin. 4. Anticoagulated: TOlerating Xarelto. No bleeding issues. 5. AFib: in NSR.  6. DM: 6.7 A1C.  COntinue healthy diet and exercise.   Current medicines are reviewed at length with the patient today.  The patient concerns regarding his medicines were addressed.  The following changes have been made:  No change  Labs/ tests ordered today include:   Orders Placed This Encounter  Procedures  . EKG 12-Lead    Recommend 150 minutes/week of aerobic exercise Low fat, low carb, high fiber diet recommended  Disposition:   FU in 1 year   Signed, Larae Grooms, MD  06/28/2019 4:27 PM    [facility] [place], [place]  [address] Phone: [phone]; Fax: [phone]

## 2019-06-28 ENCOUNTER — Encounter: Payer: Self-pay | Admitting: Interventional Cardiology

## 2019-06-28 ENCOUNTER — Other Ambulatory Visit: Payer: Self-pay

## 2019-06-28 ENCOUNTER — Ambulatory Visit (INDEPENDENT_AMBULATORY_CARE_PROVIDER_SITE_OTHER): Payer: Medicare Other | Admitting: Interventional Cardiology

## 2019-06-28 VITALS — BP 102/64 | HR 62 | Ht 74.0 in | Wt 227.0 lb

## 2019-06-28 DIAGNOSIS — Z952 Presence of prosthetic heart valve: Secondary | ICD-10-CM | POA: Diagnosis not present

## 2019-06-28 DIAGNOSIS — I480 Paroxysmal atrial fibrillation: Secondary | ICD-10-CM | POA: Diagnosis not present

## 2019-06-28 DIAGNOSIS — E1159 Type 2 diabetes mellitus with other circulatory complications: Secondary | ICD-10-CM

## 2019-06-28 DIAGNOSIS — E782 Mixed hyperlipidemia: Secondary | ICD-10-CM

## 2019-06-28 DIAGNOSIS — I25119 Atherosclerotic heart disease of native coronary artery with unspecified angina pectoris: Secondary | ICD-10-CM

## 2019-06-28 DIAGNOSIS — Z7901 Long term (current) use of anticoagulants: Secondary | ICD-10-CM | POA: Diagnosis not present

## 2019-06-28 NOTE — Patient Instructions (Signed)
Medication Instructions:  Your physician recommends that you continue on your current medications as directed. Please refer to the Current Medication list given to you today.  If you need a refill on your cardiac medications before your next appointment, please call your pharmacy.   Lab work: None Ordered If you have labs (blood work) drawn today and your tests are completely normal, you will receive your results only by: Marland Kitchen MyChart Message (if you have MyChart) OR . A paper copy in the mail If you have any lab test that is abnormal or we need to change your treatment, we will call you to review the results.  Testing/Procedures: None Ordered  Follow-Up: At [facility], you and your health needs are our priority.  As part of our continuing mission to provide you with exceptional heart care, we have created designated Provider Care Teams.  These Care Teams include your primary Cardiologist (physician) and Advanced Practice Providers (APPs -  Physician Assistants and Nurse Practitioners) who all work together to provide you with the care you need, when you need it. You will need a follow up appointment in 1 years.  Please call our office 2 months in advance to schedule this appointment.  You may see Larae Grooms, MD or one of the following Advanced Practice Providers on your designated Care Team:   [place], PA-C Melina Copa, PA-C . Ermalinda Barrios, PA-C

## 2019-07-02 NOTE — Progress Notes (Signed)
Subjective: Frank Berg presents to clinic with cc of painful mycotic toenails and calluses b/l feet  which are aggravated when weightbearing with and without shoe gear.  This pain limits his daily activities. Pain symptoms resolve with periodic professional debridement.  [name], MD is his PCP.    Current Outpatient Medications:  .  furosemide (LASIX) 20 MG tablet, Take 20 mg by mouth daily., Disp: , Rfl:  .  lansoprazole (PREVACID) 30 MG capsule, Take 30 mg by mouth as needed. , Disp: , Rfl:  .  magnesium oxide (MAG-OX) 400 (241.3 Mg) MG tablet, Take 400 mg by mouth daily., Disp: , Rfl: 3 .  metFORMIN (GLUCOPHAGE) 500 MG tablet, Take 500 mg by mouth 2 (two) times daily with a meal., Disp: , Rfl:  .  Omega-3 Fatty Acids (FISH OIL) 1200 MG CPDR, Take by mouth daily., Disp: , Rfl:  .  potassium chloride (MICRO-K) 10 MEQ CR capsule, Take 10 mEq by mouth 2 (two) times daily., Disp: , Rfl:  .  rosuvastatin (CRESTOR) 5 MG tablet, Take 5 mg by mouth every evening., Disp: , Rfl:  .  telmisartan (MICARDIS) 40 MG tablet, Take 40 mg by mouth daily., Disp: , Rfl:  .  XARELTO 20 MG TABS tablet, Take 20 mg by mouth every evening., Disp: , Rfl:    Allergies  Allergen Reactions  . Atorvastatin     Body aches  . Penicillins Swelling    Swollen face     Objective: Physical Examination:  Vascular  Examination: Capillary refill time <3 seconds  x 10 digits.  Palpable DP/PT pulses b/l.  Digital hair absent  b/l.  No edema noted b/l.  Skin temperature gradient WNL b/l.  Dermatological Examination: Skin with normal turgor, texture and tone b/l.  No open wounds b/l.  No interdigital macerations noted b/l.  Elongated, thick, discolored brittle toenails with subungual debris and pain on dorsal palpation of nailbeds toenails 1-5 left foot, 1, 2, 4, 5 right foot.   Hyperkeratotic lesion submet heads 3 b/l, submet head 4 right foot and submet head 5 right foot with tenderness to  palpation. No edema, no erythema, no drainage, no flocculence.  Musculoskeletal Examination: Muscle strength 5/5 to all muscle groups b/l.  No pain, crepitus or joint discomfort with active/passive ROM.  Neurological Examination: Sensation diminished b/l with 10 gram monofilament.  Vibratory sensation diminished b/l.  Assessment: 1. Mycotic nail infection with 1-5 left foot, 1, 2, 4, 5 right foot.   2. Calluses submet heads 3 b/l, submet head 4 right foot and submet head 5 right foot  3. NIDDM with neuropathy  Plan: 1. Toenails 1-5 left foot, 1, 2, 4, 5 right foot.were debrided in length and girth without iatrogenic laceration. 2. Calluses pared submetatarsal head(s) 3 b/l, submet head 4 right foot and submet head 5 right foot utilizing sterile scalpel blade without incident. 3. Continue soft, supportive shoe gear daily. 4. Report any pedal injuries to medical professional. 5. Follow up 3 months. 6. Patient/POA to call should there be a question/concern in there interim.

## 2019-10-01 ENCOUNTER — Ambulatory Visit: Payer: Medicare Other | Admitting: Podiatry

## 2019-10-01 ENCOUNTER — Encounter: Payer: Self-pay | Admitting: Podiatry

## 2019-10-01 ENCOUNTER — Other Ambulatory Visit: Payer: Self-pay

## 2019-10-01 DIAGNOSIS — L84 Corns and callosities: Secondary | ICD-10-CM

## 2019-10-01 DIAGNOSIS — M79674 Pain in right toe(s): Secondary | ICD-10-CM | POA: Diagnosis not present

## 2019-10-01 DIAGNOSIS — M79675 Pain in left toe(s): Secondary | ICD-10-CM | POA: Diagnosis not present

## 2019-10-01 DIAGNOSIS — B351 Tinea unguium: Secondary | ICD-10-CM

## 2019-10-01 DIAGNOSIS — E1142 Type 2 diabetes mellitus with diabetic polyneuropathy: Secondary | ICD-10-CM

## 2019-10-06 NOTE — Progress Notes (Signed)
Subjective: Frank Berg is a 63 y.o. y.o. male who presents today for preventative diabetic foot care. He is seen for chronic callosities and painful, mycotic toenails b/l. Pain is aggravated when wearing enclosed shoe gear and relieved with periodic professional debridement.  He wears [employer] shoe gear daily.  [name], MD is his PCP.   Medications reviewed in chart.  Allergies  Allergen Reactions  . Atorvastatin     Body aches  . Penicillins Swelling    Swollen face    Objective: There were no vitals filed for this visit.  Vascular Examination: Capillary refill time to digits <3 seconds.   Dorsalis pedis pulses palpable b/l.  Posterior tibial pulses palpable b/l.  Digital hair absent b/l.  Skin temperature gradient WNL b/l.  Dermatological Examination: Skin with normal turgor, texture and tone b/l.  Toenails 1-5 b/l discolored, thick, dystrophic with subungual debris and pain with palpation to nailbeds due to thickness of nails.  Hyperkeratotic lesion submet head 3 b/l with tenderness to palpation. No edema, no erythema, no drainage, no flocculence.  Musculoskeletal: Muscle strength 5/5 to all LE muscle groups b/l.  Neurological: Sensation diminished with 10 gram monofilament b/l.  Vibratory sensation diminished b/l.  Assessment: 1. Painful onychomycosis toenails 1-5 b/l 2.  Callus submet head 3 b/l 3.  NIDDM with neuropathy  Plan: 1. Continue diabetic foot care principles. Literature dispensed on today. 2. Toenails 1-5 b/l were debrided in length and girth without iatrogenic bleeding. 3. Calluses pared submetatarsal head 3 b/l utilizing sterile scalpel blade without incident.  4. Patient to continue soft, supportive shoe gear daily. 5. Patient to report any pedal injuries to medical professional immediately. 6. Follow up 3 months.  7. Patient/POA to call should there be a concern in the interim.

## 2019-12-02 ENCOUNTER — Ambulatory Visit: Payer: Self-pay

## 2019-12-02 ENCOUNTER — Encounter: Payer: Self-pay | Admitting: Orthopaedic Surgery

## 2019-12-02 ENCOUNTER — Ambulatory Visit: Payer: Medicare Other | Admitting: Orthopaedic Surgery

## 2019-12-02 ENCOUNTER — Other Ambulatory Visit: Payer: Self-pay

## 2019-12-02 VITALS — Ht 74.0 in | Wt 224.0 lb

## 2019-12-02 DIAGNOSIS — M25551 Pain in right hip: Secondary | ICD-10-CM | POA: Diagnosis not present

## 2019-12-02 DIAGNOSIS — M25561 Pain in right knee: Secondary | ICD-10-CM | POA: Diagnosis not present

## 2019-12-02 NOTE — Progress Notes (Addendum)
Office Visit Note   Patient: Frank Berg           Date of Birth: Oct 16, 1956           MRN: [id number] Visit Date: 12/02/2019              Requested by: [name], MD [place],  [place] [address] PCP: [name], MD   Assessment & Plan: Visit Diagnoses:  1. Acute pain of right knee   2. Pain in right hip     Plan: Current symptoms are severe enough to consider possible injection in his right knee.  We discussed some therapy referral if he has persistent symptoms. Right knee injection performed. He tolerated it well.  Follow-Up Instructions: No follow-ups on file.   Orders:  Orders Placed This Encounter  Procedures  . XR KNEE 3 VIEW RIGHT  . XR Pelvis 1-2 Views   No orders of the defined types were placed in this encounter.     Procedures: No procedures performed   Clinical Data: No additional findings.   Subjective: Chief Complaint  Patient presents with  . Right Knee - Pain    HPI 64 year old male with right knee pain off and on for a few months.  He denies a specific injury does have history of gout.  He is on allopurinol 450 mg daily and also uses colchicine as needed for acute attacks.  He has had gout in his feet before, ankles, elbow, olecranon bursa, knees.  Patient states that pain in his knee is aching pain with swelling and does not feel like his normal gout attack.  Review of Systems patient history of coronary disease no current chest pain angina.  Negative MI.  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  No current dyspnea.  Prosthetic valve, sleep apnea type 2 diabetes.  Atrial fib on Xarelto.   Objective: Vital Signs: Ht 6\' 2"  (1.88 m)   Wt 224 lb (101.6 kg)   BMI 28.76 kg/m   Physical Exam Constitutional:      Appearance: He is well-developed.  HENT:     Head: Normocephalic and atraumatic.  Eyes:     Pupils: Pupils are equal, round, and reactive to light.  Neck:     Thyroid: No thyromegaly.     Trachea: No tracheal deviation.  Cardiovascular:     Rate and Rhythm: Normal rate.  Pulmonary:     Effort: Pulmonary effort is normal.     Breath sounds: No wheezing.  Abdominal:     General: Bowel sounds are normal.     Palpations: Abdomen is soft.  Skin:    General: Skin is warm and dry.     Capillary Refill: Capillary refill takes less than 2 seconds.  Neurological:     Mental Status: He is alert and oriented to person, place, and time.  Psychiatric:        Behavior: Behavior normal.        Thought Content: Thought content normal.        Judgment: Judgment normal.     Ortho Exam patient is amatory without a limp negative logroll the hips knees reach full extension mild lateral joint line tenderness right and left knee.  No hip flexion contracture distal pulses are intact.  No synovitis of the knees.  Slight thickening right olecranon bursa without increased warmth and no tenderness.  Knee and ankle jerk are intact.  Patient is amatory without a limp.  He is able to  heel and toe walk.  Specialty Comments:  No specialty comments available.  Imaging: No results found.   PMFS History: Patient Active Problem List   Diagnosis Date Noted  . Coronary artery disease due to lipid rich plaque 12/27/2018  . Atrial fibrillation ([place]) 12/27/2018  . Right-sided Bell's palsy 07/30/2018  . S/P AVR 06/20/2017  . CAD (coronary artery disease) 06/20/2017  . Hyperlipidemia 06/20/2017  . PAF (paroxysmal atrial fibrillation) ([place]) 06/20/2017  . Acute systolic CHF (congestive heart failure) ([place]) 10/10/2016  . Elevated troponin 10/09/2016  . Benign essential hypertension 09/05/2016  . Healthcare maintenance 09/05/2016  . Need for immunization against influenza 09/05/2016  . Pure hypercholesterolemia 09/05/2016  . Typical atrial flutter ([place]) 07/19/2016  . OSA on CPAP 06/02/2016  . Type 2 diabetes mellitus ([place]) 06/02/2016  . Bilateral edema of lower extremity 05/02/2016  . Presence of  prosthetic heart valve 05/02/2016  . DOE (dyspnea on exertion) 05/02/2016   Past Medical History:  Diagnosis Date  . Atrial fibrillation ([place])   . Coronary artery disease   . Diabetes mellitus without complication ([place])   . Diabetic polyneuropathy ([place])   . Gout, unspecified   . Hypertension   . OSA on CPAP     Family History  Problem Relation Age of Onset  . Congenital heart disease Mother   . Heart attack Father   . Cancer Sister   . Hypertension Brother   . Pancreatic cancer Brother     Past Surgical History:  Procedure Laterality Date  . ABLATION    . AORTIC VALVE REPLACEMENT  2011  . NECK SURGERY     spinal stenosis   Social History   Occupational History  . Not on file  Tobacco Use  . Smoking status: Former Smoker    Quit date: 11/03/1981    Years since quitting: 38.1  . Smokeless tobacco: Never Used  Substance and Sexual Activity  . Alcohol use: Yes    Comment: occasional  . Drug use: No  . Sexual activity: Not on file

## 2019-12-19 ENCOUNTER — Telehealth: Payer: Self-pay

## 2019-12-19 NOTE — Telephone Encounter (Signed)
Send in prednisone 10 mg  one po bid times 3 days then one daily  times 3 days. . # 20 he can save rest of Rx for later . thanks

## 2019-12-19 NOTE — Telephone Encounter (Signed)
Patient called triage line. He has been experiencing pain in his left foot and right wrist for 3 days now. Both are red, tender, and swollen. He said that he has a history of gout and feels certain that this is what's causing his pain. He takes Allopurinol and Colchicine for gout. He is a patient of Dr.Yates and would like to know what he recommends for this issue? Pt call back # [phone]

## 2019-12-19 NOTE — Telephone Encounter (Signed)
Please advise 

## 2019-12-20 MED ORDER — PREDNISONE 10 MG PO TABS: ORAL_TABLET | ORAL | 0 refills | Status: DC

## 2019-12-20 NOTE — Addendum Note (Signed)
Addended by: Meyer Cory on: 12/20/2019 08:33 AM   Modules accepted: Orders

## 2019-12-20 NOTE — Telephone Encounter (Signed)
Sent to pharmacy. I left voicemail for patient advising. 

## 2019-12-23 ENCOUNTER — Telehealth: Payer: Self-pay | Admitting: Orthopaedic Surgery

## 2019-12-23 NOTE — Telephone Encounter (Signed)
I called and discussed.

## 2019-12-23 NOTE — Telephone Encounter (Signed)
Patient called advised he is now having pain in his left knee and swelling. Patient said he has not started the prednisone because he is currently taking Febuxostat prescribed by Dr. Forde Dandy. Patient did not want to take the two Rx together. The number to contact patient is [phone]

## 2019-12-23 NOTE — Telephone Encounter (Signed)
Please advise. Is there anything else that you recommend for patient to do?

## 2019-12-31 ENCOUNTER — Other Ambulatory Visit: Payer: Self-pay

## 2019-12-31 ENCOUNTER — Ambulatory Visit: Payer: Medicare Other | Admitting: Podiatry

## 2019-12-31 DIAGNOSIS — M79674 Pain in right toe(s): Secondary | ICD-10-CM

## 2019-12-31 DIAGNOSIS — E1142 Type 2 diabetes mellitus with diabetic polyneuropathy: Secondary | ICD-10-CM | POA: Diagnosis not present

## 2019-12-31 DIAGNOSIS — M2042 Other hammer toe(s) (acquired), left foot: Secondary | ICD-10-CM

## 2019-12-31 DIAGNOSIS — L84 Corns and callosities: Secondary | ICD-10-CM | POA: Diagnosis not present

## 2019-12-31 DIAGNOSIS — M79675 Pain in left toe(s): Secondary | ICD-10-CM

## 2019-12-31 DIAGNOSIS — M2041 Other hammer toe(s) (acquired), right foot: Secondary | ICD-10-CM

## 2019-12-31 DIAGNOSIS — B351 Tinea unguium: Secondary | ICD-10-CM

## 2019-12-31 DIAGNOSIS — E119 Type 2 diabetes mellitus without complications: Secondary | ICD-10-CM | POA: Diagnosis not present

## 2019-12-31 NOTE — Patient Instructions (Signed)
Diabetes Mellitus and Foot Care Foot care is an important part of your health, especially when you have diabetes. Diabetes may cause you to have problems because of poor blood flow (circulation) to your feet and legs, which can cause your skin to:  Become thinner and drier.  Break more easily.  Heal more slowly.  Peel and crack. You may also have nerve damage (neuropathy) in your legs and feet, causing decreased feeling in them. This means that you may not notice minor injuries to your feet that could lead to more serious problems. Noticing and addressing any potential problems early is the best way to prevent future foot problems. How to care for your feet Foot hygiene  Wash your feet daily with warm water and mild soap. Do not use hot water. Then, pat your feet and the areas between your toes until they are completely dry. Do not soak your feet as this can dry your skin.  Trim your toenails straight across. Do not dig under them or around the cuticle. File the edges of your nails with an emery board or nail file.  Apply a moisturizing lotion or petroleum jelly to the skin on your feet and to dry, brittle toenails. Use lotion that does not contain alcohol and is unscented. Do not apply lotion between your toes. Shoes and socks  Wear clean socks or stockings every day. Make sure they are not too tight. Do not wear knee-high stockings since they may decrease blood flow to your legs.  Wear shoes that fit properly and have enough cushioning. Always look in your shoes before you put them on to be sure there are no objects inside.  To break in new shoes, wear them for just a few hours a day. This prevents injuries on your feet. Wounds, scrapes, corns, and calluses  Check your feet daily for blisters, cuts, bruises, sores, and redness. If you cannot see the bottom of your feet, use a mirror or ask someone for help.  Do not cut corns or calluses or try to remove them with medicine.  If you  find a minor scrape, cut, or break in the skin on your feet, keep it and the skin around it clean and dry. You may clean these areas with mild soap and water. Do not clean the area with peroxide, alcohol, or iodine.  If you have a wound, scrape, corn, or callus on your foot, look at it several times a day to make sure it is healing and not infected. Check for: ? Redness, swelling, or pain. ? Fluid or blood. ? Warmth. ? Pus or a bad smell. General instructions  Do not cross your legs. This may decrease blood flow to your feet.  Do not use heating pads or hot water bottles on your feet. They may burn your skin. If you have lost feeling in your feet or legs, you may not know this is happening until it is too late.  Protect your feet from hot and cold by wearing shoes, such as at the beach or on hot pavement.  Schedule a complete foot exam at least once a year (annually) or more often if you have foot problems. If you have foot problems, report any cuts, sores, or bruises to your health care provider immediately. Contact a health care provider if:  You have a medical condition that increases your risk of infection and you have any cuts, sores, or bruises on your feet.  You have an injury that is not   healing.  You have redness on your legs or feet.  You feel burning or tingling in your legs or feet.  You have pain or cramps in your legs and feet.  Your legs or feet are numb.  Your feet always feel cold.  You have pain around a toenail. Get help right away if:  You have a wound, scrape, corn, or callus on your foot and: ? You have pain, swelling, or redness that gets worse. ? You have fluid or blood coming from the wound, scrape, corn, or callus. ? Your wound, scrape, corn, or callus feels warm to the touch. ? You have pus or a bad smell coming from the wound, scrape, corn, or callus. ? You have a fever. ? You have a red line going up your leg. Summary  Check your feet every day  for cuts, sores, red spots, swelling, and blisters.  Moisturize feet and legs daily.  Wear shoes that fit properly and have enough cushioning.  If you have foot problems, report any cuts, sores, or bruises to your health care provider immediately.  Schedule a complete foot exam at least once a year (annually) or more often if you have foot problems. This information is not intended to replace advice given to you by your health care provider. Make sure you discuss any questions you have with your health care provider. Document Revised: 07/03/2019 Document Reviewed: 11/11/2016 Elsevier Patient Education  2020 Elsevier Inc.  

## 2020-01-06 ENCOUNTER — Encounter: Payer: Self-pay | Admitting: Podiatry

## 2020-01-06 NOTE — Progress Notes (Signed)
Subjective: Frank Berg presents today for follow up of preventative diabetic foot care, for diabetic foot evaluation and callus(es) b/l feet and painful mycotic toenails b/l that are difficult to trim. Pain interferes with ambulation. Aggravating factors include wearing enclosed shoe gear. Pain is relieved with periodic professional debridement.  He voices no new pedal problems on today's visit. He is also on blood thinner, Xarelto.  Allergies  Allergen Reactions  . Atorvastatin     Body aches  . Penicillins Swelling    Swollen face     Objective: There were no vitals filed for this visit.  Pt 64 y.o. year old AA  male WD, WN in NAD. AAO x 3.   Vascular Examination:  Capillary fill time to digits <3 seconds b/l. Palpable DP pulses b/l. Palpable PT pulses b/l. Pedal hair absent b/l Skin temperature gradient within normal limits b/l.  Dermatological Examination: Pedal skin with normal turgor, texture and tone bilaterally. No open wounds bilaterally. No interdigital macerations bilaterally. Toenails 1-5 b/l elongated, dystrophic, thickened, crumbly with subungual debris and tenderness to dorsal palpation. Hyperkeratotic lesion(s) submet head 3 b/l and submet head 4 right foot.  No erythema, no edema, no drainage, no flocculence.  Musculoskeletal: Normal muscle strength 5/5 to all lower extremity muscle groups bilaterally and hammertoes noted to the  L 3rd toe, R 3rd toe and R 4th toe  Neurological: Protective sensation decreased with 10 gram monofilament b/l Vibratory sensation intact b/l  Assessment: 1. Pain due to onychomycosis of toenails of both feet   2. Callus   3. Acquired hammertoes of both feet   4. Diabetic peripheral neuropathy associated with type 2 diabetes mellitus ([place])   5. Encounter for diabetic foot exam ([place])    Plan: -Diabetic foot examination performed on today's visit. -Continue diabetic foot care principles. Literature dispensed on today.  -Toenails 1-5  b/l were debrided in length and girth with sterile nail nippers and dremel without iatrogenic bleeding.  -Calluses submet head 3 b/l and submet head 4 right foot were debrided without complication or incident. Total number debrided =3. -Patient to continue soft, supportive shoe gear daily. -Patient to report any pedal injuries to medical professional immediately. -Patient/POA to call should there be question/concern in the interim.  Return in about 3 months (around 04/01/2020) for diabetic nail and callus trim.

## 2020-01-08 ENCOUNTER — Telehealth: Payer: Self-pay | Admitting: Orthopaedic Surgery

## 2020-01-08 NOTE — Telephone Encounter (Signed)
Patient called and stated that he wants someone to call back in regards to injections given to him 12/02/19. Wants to discuss with Dr. Lorin Mercy. Advised Gwinda Passe would probably call him back.  Please call patient to advise.[phone]

## 2020-01-08 NOTE — Telephone Encounter (Signed)
Please advise.  Returned call to patient, stated he went to one of his doctors today and mentioned having a cortisone injection back on 12/02/19. No injection in documented, notes state at that time symptoms were not severe enough for injection and suggested therapy. Patient very concerned about this states he remembers having the injection.   Call back # listed below.

## 2020-01-09 NOTE — Telephone Encounter (Signed)
noted 

## 2020-01-09 NOTE — Telephone Encounter (Signed)
I called discussed. Corrected in note. No charge for injection. He will make ROV for recheck

## 2020-02-07 ENCOUNTER — Other Ambulatory Visit: Payer: Self-pay

## 2020-02-07 ENCOUNTER — Ambulatory Visit (INDEPENDENT_AMBULATORY_CARE_PROVIDER_SITE_OTHER): Payer: Medicare Other

## 2020-02-07 ENCOUNTER — Encounter: Payer: Self-pay | Admitting: Podiatry

## 2020-02-07 ENCOUNTER — Ambulatory Visit: Payer: Medicare Other | Admitting: Podiatry

## 2020-02-07 DIAGNOSIS — M779 Enthesopathy, unspecified: Secondary | ICD-10-CM

## 2020-02-07 DIAGNOSIS — M1000 Idiopathic gout, unspecified site: Secondary | ICD-10-CM

## 2020-02-07 DIAGNOSIS — M778 Other enthesopathies, not elsewhere classified: Secondary | ICD-10-CM

## 2020-02-07 DIAGNOSIS — M7661 Achilles tendinitis, right leg: Secondary | ICD-10-CM | POA: Diagnosis not present

## 2020-02-07 NOTE — Patient Instructions (Signed)

## 2020-02-12 NOTE — Progress Notes (Signed)
Cardiology Office Note   Date:  02/13/2020   ID:  Frank Berg, DOB [birth year], MRN [id number]  PCP:  [name], MD    No chief complaint on file.  AVR  Wt Readings from Last 3 Encounters:  02/13/20 224 lb 12.8 oz (102 kg)  12/02/19 224 lb (101.6 kg)  06/28/19 227 lb (103 kg)       History of Present Illness: Frank Berg is a 64 y.o. male   with a history of aortic valve replacement in 2011.  He had a heart murmur diagnosed at age 1. Presumably, this was from a bicuspid valve.   In 2011, he had an AVR at age 89. It was a bovine valve. He takes SBE prophylaxis with doxycycline. He had a stent placed in 2008. He had a one vessel CABG at the time of valve replacement. Chest burning was anginal sx.  He had AFib and subsequently had an AFib ablation in 12/17. No recurrences of the AFib.   He moved to [facility] from [place] to be in a warmer place with lower taxes.  He has been on Xarelto since that time. He wanted to come off Xarelto after ablation.  His sx with AFib were DOE. He could not sense palpitatons. He took amiodarone and metoprolol for a short time before the ablation.  Note from [facility]2018, Dr. Lubertha Basque states: "S/P cryoablation of arrhythmia  10/07/16: Cryoablation/PVI and RFA of baseline AFLutter Ablated to sinus Then transseptal to LA Isolated PVs  Frank Berg is doing very well after ablation for atrial flutter and fibrillation, maintaining sinus rhythm. He had his amiodarone stopped, and continues to maintain sinus.  We also discussed anticoagulation it is unclear as to whether is ever safe to come off anticoagulation when the risk score suggests a patient with atrial fibrillation should be on, and since he has only been off amiodarone for a month or 2 we will keep him on anticoagulation for the short term. Maybe at the year mark if he has no more atrial fibrillation we can contemplate coming off if he is willing to accept the  risk of recurrent, asymptomatic atrial fibrillation."  He saw Dr. Rayann Heman in 2019 regarding stopping anticoagulation: "We discussed that AHA/ACC/HRS guidelines would say that he should continue long term anticoagulation.  We also discussed that there is in the guidelines allowance for monitoring post ablation with an implantable loop recorder.  Risks and benefits of ILR were discussed at length today.  I think that this would be a good option for him.  If he has no afib on ILR then we could stop anticoagualtion and then resume if ever AF was detected."  He decided not to have ILR placed.   His son got sick with COVID in 2020 and was in the ICU.  Son recovered after being intubated.  Since the last visit, he has had ortho issues that have limited walking.  He has some more dizziness with change in positions.    Past Medical History:  Diagnosis Date  . Atrial fibrillation ([place])   . Coronary artery disease   . Diabetes mellitus without complication ([place])   . Diabetic polyneuropathy ([place])   . Gout, unspecified   . Hypertension   . OSA on CPAP     Past Surgical History:  Procedure Laterality Date  . ABLATION    . AORTIC VALVE REPLACEMENT  2011  . NECK SURGERY     spinal stenosis     Current Outpatient Medications  Medication Sig Dispense Refill  . allopurinol (ZYLOPRIM) 300 MG tablet Take 450 mg by mouth daily.    . clindamycin (CLEOCIN T) 1 % external solution     . colchicine 0.6 MG tablet Take 0.6 mg by mouth 2 (two) times daily.    . febuxostat (ULORIC) 40 MG tablet Take 40 mg by mouth daily.    . furosemide (LASIX) 20 MG tablet Take 20 mg by mouth daily.    . lansoprazole (PREVACID) 30 MG capsule Take 30 mg by mouth as needed.     . magnesium oxide (MAG-OX) 400 (241.3 Mg) MG tablet Take 400 mg by mouth daily.  3  . magnesium oxide (MAG-OX) 400 MG tablet Take 1 tablet by mouth daily.    . metFORMIN (GLUCOPHAGE) 500 MG tablet Take 500 mg by mouth 2 (two) times daily with a meal.     . Omega-3 Fatty Acids (FISH OIL) 1200 MG CPDR Take by mouth daily.    . potassium chloride (MICRO-K) 10 MEQ CR capsule Take 10 mEq by mouth 2 (two) times daily.    . predniSONE (DELTASONE) 10 MG tablet Take one tablet bid x 3 days and then one tablet daily x 3 days. 20 tablet 0  . rosuvastatin (CRESTOR) 5 MG tablet Take 5 mg by mouth every evening.    . tadalafil (CIALIS) 20 MG tablet Take 20 mg by mouth daily as needed.    Marland Kitchen telmisartan (MICARDIS) 40 MG tablet Take 40 mg by mouth daily.    Alveda Reasons 20 MG TABS tablet Take 20 mg by mouth every evening.     No current facility-administered medications for this visit.    Allergies:   Atorvastatin and Penicillins    Social History:  The patient  reports that he quit smoking about 38 years ago. He has never used smokeless tobacco. He reports current alcohol use. He reports that he does not use drugs.   Family History:  The patient's  family history includes Cancer in his sister; Congenital heart disease in his mother; Heart attack in his father; Hypertension in his brother; Pancreatic cancer in his brother.    ROS:  Please see the history of present illness.   Otherwise, review of systems are positive for dizziness.   All other systems are reviewed and negative.    PHYSICAL EXAM: VS:  BP 124/76   Pulse 87   Ht 6\' 2"  (1.88 m)   Wt 224 lb 12.8 oz (102 kg)   SpO2 98%   BMI 28.86 kg/m  , BMI Body mass index is 28.86 kg/m. GEN: Well nourished, well developed, in no acute distress  HEENT: normal  Neck: no JVD, carotid bruits, or masses Cardiac: RRR; no murmurs, rubs, or gallops,no edema  Respiratory:  clear to auscultation bilaterally, normal work of breathing GI: soft, nontender, nondistended, + BS MS: no deformity or atrophy  Skin: warm and dry, no rash Neuro:  Strength and sensation are intact Psych: euthymic mood, full affect   EKG:   The ekg ordered in 07/2019 demonstrates NSR, RBBB   Recent Labs: No results found for  requested labs within last 8760 hours.   Lipid Panel No results found for: CHOL, TRIG, HDL, CHOLHDL, VLDL, LDLCALC, LDLDIRECT   Other studies Reviewed: Additional studies/ records that were reviewed today with results demonstrating: PMD labs reviewed.   ASSESSMENT AND PLAN:  1. AVR: SBE prophylaxis. Functioning well by exam.   2. CAD: s/p PCI in 2008, followed by 1 vessel  CABG in 2011. 3. Hyperlipidemia: Continue rosuvastatin. LDL 92 in 08/2019 4. Anticoagulaed: COntinue Xarelto. No bleeding problems.  5. AFib: s/p ablation.  Maintaining NSR.  6. DM: A1C 7.0 in 3/21.  Whole food, plant based diet. High fiber diet recommended.  Avoid red meat given gout as well.  7. SHOB: Worse with bending down.  Increase fluid intake.  COntinue regular exercise as feet tolerated.     Current medicines are reviewed at length with the patient today.  The patient concerns regarding his medicines were addressed.  The following changes have been made:  No change  Labs/ tests ordered today include:  No orders of the defined types were placed in this encounter.   Recommend 150 minutes/week of aerobic exercise Low fat, low carb, high fiber diet recommended  Disposition:   FU in 7-8 month   Signed, Larae Grooms, MD  02/13/2020 3:07 PM    [facility] [address], [place], [place]  [address] Phone: [phone]; Fax: [phone]

## 2020-02-13 ENCOUNTER — Other Ambulatory Visit: Payer: Self-pay

## 2020-02-13 ENCOUNTER — Encounter: Payer: Self-pay | Admitting: Interventional Cardiology

## 2020-02-13 ENCOUNTER — Ambulatory Visit: Payer: Medicare Other | Admitting: Interventional Cardiology

## 2020-02-13 VITALS — BP 124/76 | HR 87 | Ht 74.0 in | Wt 224.8 lb

## 2020-02-13 DIAGNOSIS — I480 Paroxysmal atrial fibrillation: Secondary | ICD-10-CM

## 2020-02-13 DIAGNOSIS — E782 Mixed hyperlipidemia: Secondary | ICD-10-CM

## 2020-02-13 DIAGNOSIS — Z7901 Long term (current) use of anticoagulants: Secondary | ICD-10-CM

## 2020-02-13 DIAGNOSIS — I25119 Atherosclerotic heart disease of native coronary artery with unspecified angina pectoris: Secondary | ICD-10-CM | POA: Diagnosis not present

## 2020-02-13 DIAGNOSIS — Z952 Presence of prosthetic heart valve: Secondary | ICD-10-CM | POA: Diagnosis not present

## 2020-02-13 DIAGNOSIS — E1159 Type 2 diabetes mellitus with other circulatory complications: Secondary | ICD-10-CM

## 2020-02-13 NOTE — Progress Notes (Signed)
Subjective:   Patient ID: Frank Berg, male   DOB: 64 y.o.   MRN: [id number]   HPI Patient presents stating he has had a history of gout and is concerned about this and also has developed some pain in the back of his right heel that is making it hard for him to walk comfortably.  Does not remember injury but states it is been more recent   ROS      Objective:  Physical Exam  Neurovascular status found to be unchanged with patient having mild to moderate posterior discomfort localized to the lateral side of the Achilles tendon with no central medial involvement with mild swelling to the dorsum of the foot and into the sinus tarsi with history of first MPJ inflammation     Assessment:  Probability for low-grade gout attack which has occurred and has created not significant attacks but moderate attacks over period of time with Achilles tendinitis right     Plan:  H&P reviewed condition.  I discussed careful injection with possibility for rupture and patient is willing to accept this risk I did a sterile prep the right lateral side carefully injected 3 mg dexamethasone 5 mg Xylocaine advised on reduced activity and I then gave him information on gout diet educated him on foods to avoid and the possibility for medication and reviewed different medicines that could be considered if symptoms were to progress  X-rays indicate moderate spur formation no indication stress fracture or advanced arthritis

## 2020-02-13 NOTE — Patient Instructions (Signed)
Medication Instructions:  Your physician recommends that you continue on your current medications as directed. Please refer to the Current Medication list given to you today.  *If you need a refill on your cardiac medications before your next appointment, please call your pharmacy*   Lab Work: None ordered  If you have labs (blood work) drawn today and your tests are completely normal, you will receive your results only by: Marland Kitchen MyChart Message (if you have MyChart) OR . A paper copy in the mail If you have any lab test that is abnormal or we need to change your treatment, we will call you to review the results.   Testing/Procedures: None ordered   Follow-Up: At [facility], you and your health needs are our priority.  As part of our continuing mission to provide you with exceptional heart care, we have created designated Provider Care Teams.  These Care Teams include your primary Cardiologist (physician) and Advanced Practice Providers (APPs -  Physician Assistants and Nurse Practitioners) who all work together to provide you with the care you need, when you need it.  We recommend signing up for the patient portal called "MyChart".  Sign up information is provided on this After Visit Summary.  MyChart is used to connect with patients for Virtual Visits (Telemedicine).  Patients are able to view lab/test results, encounter notes, upcoming appointments, etc.  Non-urgent messages can be sent to your provider as well.   To learn more about what you can do with MyChart, go to [email or web address].    Your next appointment:   November 2021  The format for your next appointment:   In Person  Provider:   You may see Larae Grooms, MD or one of the following Advanced Practice Providers on your designated Care Team:    Melina Copa, PA-C  Ermalinda Barrios, PA-C    Other Instructions Stay well hydrated   High-Fiber Diet Fiber, also called dietary fiber, is a type of carbohydrate  that is found in fruits, vegetables, whole grains, and beans. A high-fiber diet can have many health benefits. Your health care provider may recommend a high-fiber diet to help:  Prevent constipation. Fiber can make your bowel movements more regular.  Lower your cholesterol.  Relieve the following conditions: ? Swelling of veins in the anus (hemorrhoids). ? Swelling and irritation (inflammation) of specific areas of the digestive tract (uncomplicated diverticulosis). ? A problem of the large intestine (colon) that sometimes causes pain and diarrhea (irritable bowel syndrome, IBS).  Prevent overeating as part of a weight-loss plan.  Prevent heart disease, type 2 diabetes, and certain cancers. What is my plan? The recommended daily fiber intake in grams (g) includes:  38 g for men age 46 or younger.  30 g for men over age 62.  23 g for women age 61 or younger.  21 g for women over age 79. You can get the recommended daily intake of dietary fiber by:  Eating a variety of fruits, vegetables, grains, and beans.  Taking a fiber supplement, if it is not possible to get enough fiber through your diet. What do I need to know about a high-fiber diet?  It is better to get fiber through food sources rather than from fiber supplements. There is not a lot of research about how effective supplements are.  Always check the fiber content on the nutrition facts label of any prepackaged food. Look for foods that contain 5 g of fiber or more per serving.  Talk with a  diet and nutrition specialist (dietitian) if you have questions about specific foods that are recommended or not recommended for your medical condition, especially if those foods are not listed below.  Gradually increase how much fiber you consume. If you increase your intake of dietary fiber too quickly, you may have bloating, cramping, or gas.  Drink plenty of water. Water helps you to digest fiber. What are tips for following this  plan?  Eat a wide variety of high-fiber foods.  Make sure that half of the grains that you eat each day are whole grains.  Eat breads and cereals that are made with whole-grain flour instead of refined flour or white flour.  Eat brown rice, bulgur wheat, or millet instead of white rice.  Start the day with a breakfast that is high in fiber, such as a cereal that contains 5 g of fiber or more per serving.  Use beans in place of meat in soups, salads, and pasta dishes.  Eat high-fiber snacks, such as berries, raw vegetables, nuts, and popcorn.  Choose whole fruits and vegetables instead of processed forms like juice or sauce. What foods can I eat?  Fruits Berries. Pears. Apples. Oranges. Avocado. Prunes and raisins. Dried figs. Vegetables Sweet potatoes. Spinach. Kale. Artichokes. Cabbage. Broccoli. Cauliflower. Green peas. Carrots. Squash. Grains Whole-grain breads. Multigrain cereal. Oats and oatmeal. Brown rice. Barley. Bulgur wheat. [place]. Quinoa. Bran muffins. Popcorn. Rye wafer crackers. Meats and other proteins Navy, kidney, and pinto beans. Soybeans. Split peas. Lentils. Nuts and seeds. Dairy Fiber-fortified yogurt. Beverages Fiber-fortified soy milk. Fiber-fortified orange juice. Other foods Fiber bars. The items listed above may not be a complete list of recommended foods and beverages. Contact a dietitian for more options. What foods are not recommended? Fruits Fruit juice. Cooked, strained fruit. Vegetables Fried potatoes. Canned vegetables. Well-cooked vegetables. Grains White bread. Pasta made with refined flour. White rice. Meats and other proteins Fatty cuts of meat. Fried chicken or fried fish. Dairy Milk. Yogurt. Cream cheese. Sour cream. Fats and oils Butters. Beverages Soft drinks. Other foods Cakes and pastries. The items listed above may not be a complete list of foods and beverages to avoid. Contact a dietitian for more  information. Summary  Fiber is a type of carbohydrate. It is found in fruits, vegetables, whole grains, and beans.  There are many health benefits of eating a high-fiber diet, such as preventing constipation, lowering blood cholesterol, helping with weight loss, and reducing your risk of heart disease, diabetes, and certain cancers.  Gradually increase your intake of fiber. Increasing too fast can result in cramping, bloating, and gas. Drink plenty of water while you increase your fiber.  The best sources of fiber include whole fruits and vegetables, whole grains, nuts, seeds, and beans. This information is not intended to replace advice given to you by your health care provider. Make sure you discuss any questions you have with your health care provider. Document Revised: 08/14/2017 Document Reviewed: 08/14/2017 Elsevier Patient Education  2020 [employer].

## 2020-02-17 ENCOUNTER — Other Ambulatory Visit: Payer: Self-pay | Admitting: Orthopaedic Surgery

## 2020-02-17 NOTE — Telephone Encounter (Signed)
Please advise 

## 2020-03-31 ENCOUNTER — Ambulatory Visit: Payer: Medicare Other | Admitting: Podiatry

## 2020-03-31 ENCOUNTER — Other Ambulatory Visit: Payer: Self-pay

## 2020-03-31 ENCOUNTER — Encounter: Payer: Self-pay | Admitting: Podiatry

## 2020-03-31 DIAGNOSIS — E1142 Type 2 diabetes mellitus with diabetic polyneuropathy: Secondary | ICD-10-CM | POA: Diagnosis not present

## 2020-03-31 DIAGNOSIS — M79675 Pain in left toe(s): Secondary | ICD-10-CM

## 2020-03-31 DIAGNOSIS — B351 Tinea unguium: Secondary | ICD-10-CM

## 2020-03-31 DIAGNOSIS — M79674 Pain in right toe(s): Secondary | ICD-10-CM | POA: Diagnosis not present

## 2020-03-31 NOTE — Patient Instructions (Signed)
Diabetes Mellitus and Foot Care Foot care is an important part of your health, especially when you have diabetes. Diabetes may cause you to have problems because of poor blood flow (circulation) to your feet and legs, which can cause your skin to:  Become thinner and drier.  Break more easily.  Heal more slowly.  Peel and crack. You may also have nerve damage (neuropathy) in your legs and feet, causing decreased feeling in them. This means that you may not notice minor injuries to your feet that could lead to more serious problems. Noticing and addressing any potential problems early is the best way to prevent future foot problems. How to care for your feet Foot hygiene  Wash your feet daily with warm water and mild soap. Do not use hot water. Then, pat your feet and the areas between your toes until they are completely dry. Do not soak your feet as this can dry your skin.  Trim your toenails straight across. Do not dig under them or around the cuticle. File the edges of your nails with an emery board or nail file.  Apply a moisturizing lotion or petroleum jelly to the skin on your feet and to dry, brittle toenails. Use lotion that does not contain alcohol and is unscented. Do not apply lotion between your toes. Shoes and socks  Wear clean socks or stockings every day. Make sure they are not too tight. Do not wear knee-high stockings since they may decrease blood flow to your legs.  Wear shoes that fit properly and have enough cushioning. Always look in your shoes before you put them on to be sure there are no objects inside.  To break in new shoes, wear them for just a few hours a day. This prevents injuries on your feet. Wounds, scrapes, corns, and calluses  Check your feet daily for blisters, cuts, bruises, sores, and redness. If you cannot see the bottom of your feet, use a mirror or ask someone for help.  Do not cut corns or calluses or try to remove them with medicine.  If you  find a minor scrape, cut, or break in the skin on your feet, keep it and the skin around it clean and dry. You may clean these areas with mild soap and water. Do not clean the area with peroxide, alcohol, or iodine.  If you have a wound, scrape, corn, or callus on your foot, look at it several times a day to make sure it is healing and not infected. Check for: ? Redness, swelling, or pain. ? Fluid or blood. ? Warmth. ? Pus or a bad smell. General instructions  Do not cross your legs. This may decrease blood flow to your feet.  Do not use heating pads or hot water bottles on your feet. They may burn your skin. If you have lost feeling in your feet or legs, you may not know this is happening until it is too late.  Protect your feet from hot and cold by wearing shoes, such as at the beach or on hot pavement.  Schedule a complete foot exam at least once a year (annually) or more often if you have foot problems. If you have foot problems, report any cuts, sores, or bruises to your health care provider immediately. Contact a health care provider if:  You have a medical condition that increases your risk of infection and you have any cuts, sores, or bruises on your feet.  You have an injury that is not   healing.  You have redness on your legs or feet.  You feel burning or tingling in your legs or feet.  You have pain or cramps in your legs and feet.  Your legs or feet are numb.  Your feet always feel cold.  You have pain around a toenail. Get help right away if:  You have a wound, scrape, corn, or callus on your foot and: ? You have pain, swelling, or redness that gets worse. ? You have fluid or blood coming from the wound, scrape, corn, or callus. ? Your wound, scrape, corn, or callus feels warm to the touch. ? You have pus or a bad smell coming from the wound, scrape, corn, or callus. ? You have a fever. ? You have a red line going up your leg. Summary  Check your feet every day  for cuts, sores, red spots, swelling, and blisters.  Moisturize feet and legs daily.  Wear shoes that fit properly and have enough cushioning.  If you have foot problems, report any cuts, sores, or bruises to your health care provider immediately.  Schedule a complete foot exam at least once a year (annually) or more often if you have foot problems. This information is not intended to replace advice given to you by your health care provider. Make sure you discuss any questions you have with your health care provider. Document Revised: 07/03/2019 Document Reviewed: 11/11/2016 Elsevier Patient Education  2020 Elsevier Inc.  

## 2020-03-31 NOTE — Progress Notes (Signed)
Subjective: Frank Berg presents today preventative diabetic foot care and painful mycotic nails b/l that are difficult to trim. Pain interferes with ambulation. Aggravating factors include wearing enclosed shoe gear. Pain is relieved with periodic professional debridement.   He did see Dr. Paulla Dolly for gout and Achilles tendinitis of the right LE. He states it feels better, but now he is dealing with his right knee pain.  [name], MD is patient's PCP.   Past Medical History:  Diagnosis Date  . Atrial fibrillation ([place])   . Coronary artery disease   . Diabetes mellitus without complication ([place])   . Diabetic polyneuropathy ([place])   . Gout, unspecified   . Hypertension   . OSA on CPAP      Current Outpatient Medications on File Prior to Visit  Medication Sig Dispense Refill  . allopurinol (ZYLOPRIM) 300 MG tablet Take 450 mg by mouth daily.    . clindamycin (CLEOCIN T) 1 % external solution     . colchicine 0.6 MG tablet Take 0.6 mg by mouth 2 (two) times daily.    . febuxostat (ULORIC) 40 MG tablet Take 40 mg by mouth daily.    . furosemide (LASIX) 20 MG tablet Take 20 mg by mouth daily.    . lansoprazole (PREVACID) 30 MG capsule Take 30 mg by mouth as needed.     . magnesium oxide (MAG-OX) 400 (241.3 Mg) MG tablet Take 400 mg by mouth daily.  3  . magnesium oxide (MAG-OX) 400 MG tablet Take 1 tablet by mouth daily.    . metFORMIN (GLUCOPHAGE) 500 MG tablet Take 500 mg by mouth 2 (two) times daily with a meal.    . Omega-3 Fatty Acids (FISH OIL) 1200 MG CPDR Take by mouth daily.    . potassium chloride (MICRO-K) 10 MEQ CR capsule Take 10 mEq by mouth 2 (two) times daily.    . predniSONE (DELTASONE) 10 MG tablet TAKE 1 TABLET BY MOUTH TWICE A DAY FOR 3 DAYS THEN 1 EVERY DAY FOR 3 DAYS 20 tablet 0  . rosuvastatin (CRESTOR) 5 MG tablet Take 5 mg by mouth every evening.    . tadalafil (CIALIS) 20 MG tablet Take 20 mg by mouth daily as needed.    Marland Kitchen telmisartan (MICARDIS) 40 MG  tablet Take 40 mg by mouth daily.    Alveda Reasons 20 MG TABS tablet Take 20 mg by mouth every evening.     No current facility-administered medications on file prior to visit.     Allergies  Allergen Reactions  . Atorvastatin     Body aches  . Penicillins Swelling    Swollen face    Objective: Frank Berg is a pleasant 64 y.o. y.o. Patient Race: Black or African American [2]  male in NAD. AAO x 3.  There were no vitals filed for this visit.  Vascular Examination: Neurovascular status unchanged b/l lower extremities. Capillary refill time to digits immediate b/l. Palpable DP pulses b/l. Palpable PT pulses b/l. Pedal hair absent b/l. Skin temperature gradient within normal limits b/l. No pain with calf compression b/l. No edema noted b/l.  Dermatological Examination: Pedal skin with normal turgor, texture and tone bilaterally. No open wounds bilaterally. No interdigital macerations bilaterally. Toenails 1-5 left, R hallux, R 2nd toe, R 4th toe and R 5th toe elongated, discolored, dystrophic, thickened, and crumbly with subungual debris and tenderness to dorsal palpation. Anonychia noted R 3rd toe. Nailbed(s) epithelialized.   Musculoskeletal: Normal muscle strength 5/5 to all lower extremity  muscle groups bilaterally. No pain crepitus or joint limitation noted with ROM b/l. Hammertoes noted to the 2-5 bilaterally. Patient ambulates independent of any assistive aids.  Neurological Examination: Protective sensation intact 5/5 intact bilaterally with 10g monofilament b/l. Vibratory sensation intact b/l. Proprioception intact bilaterally.  Assessment: 1. Pain due to onychomycosis of toenails of both feet   2. Diabetic peripheral neuropathy associated with type 2 diabetes mellitus ([place])   Plan: -Examined patient. -No new findings. No new orders. -Toenails 1-5 left, R hallux, R 2nd toe, R 4th toe and R 5th toe debrided in length and girth without iatrogenic bleeding with sterile nail  nipper and dremel.  -Patient to continue soft, supportive shoe gear daily. -Patient to report any pedal injuries to medical professional immediately. -Patient/POA to call should there be question/concern in the interim.  Return in about 3 months (around 07/01/2020) for diabetic nail and callus trim.  Marzetta Board, DPM

## 2020-04-13 ENCOUNTER — Ambulatory Visit: Payer: BLUE CROSS/BLUE SHIELD | Admitting: Adult Health

## 2020-04-24 ENCOUNTER — Telehealth: Payer: Self-pay | Admitting: Interventional Cardiology

## 2020-04-24 DIAGNOSIS — R0602 Shortness of breath: Secondary | ICD-10-CM

## 2020-04-24 NOTE — Telephone Encounter (Signed)
Called and spoke to patient. He states that he has had SOB on exertion x 2 weeks. Denies swelling, weight gain, chest pain, palpitations, irregular beats or any other Sx. NO vitals available. Patient did have DOE in the past with his Afib, s/p ablation 2017, declined ILR in the past. Taking Xarelto. S/P AVR with last echo in 2018 showing normal functioning valve. Patient also wears a CPAP and states that he finds himself pulling this off more at night due to not being able to breathe. I have asked the patient to reach out to the MD that manages his OSA. Denies increased salt in his diet. Taking lasix 20 mg QD and k-dur 10 mEq BID. Instructed patient to let us know if his Sx worsen. Will forward to Dr. Irish Lack for review.

## 2020-04-24 NOTE — Telephone Encounter (Signed)
Pt c/o Shortness Of Breath: STAT if SOB developed within the last 24 hours or pt is noticeably SOB on the phone  1. Are you currently SOB (can you hear that pt is SOB on the phone)? no  2. How long have you been experiencing SOB? 2 weeks  3. Are you SOB when sitting or when up moving around? Moving around, going up stairs  4. Are you currently experiencing any other symptoms? No

## 2020-04-25 NOTE — Telephone Encounter (Signed)
OK to check BMet and BNP and chest xray for shortness of breath.  JV.

## 2020-04-28 ENCOUNTER — Ambulatory Visit
Admission: RE | Admit: 2020-04-28 | Discharge: 2020-04-28 | Disposition: A | Discharge status: Home / Self Care | Payer: Medicare Other | Source: Ambulatory Visit | Attending: Interventional Cardiology | Admitting: Interventional Cardiology

## 2020-04-28 ENCOUNTER — Other Ambulatory Visit: Payer: Self-pay

## 2020-04-28 ENCOUNTER — Other Ambulatory Visit: Payer: Medicare Other

## 2020-04-28 DIAGNOSIS — R0602 Shortness of breath: Secondary | ICD-10-CM

## 2020-04-28 NOTE — Telephone Encounter (Signed)
Follow up  ° ° °Pt returning call  °

## 2020-04-28 NOTE — Telephone Encounter (Signed)
Left message to call back  

## 2020-04-28 NOTE — Telephone Encounter (Signed)
Called and made patient aware that Dr. Irish Lack would like for him to have a BMET , BNP, and CXR. Patient will come today to complete.

## 2020-04-29 ENCOUNTER — Encounter: Payer: Self-pay | Admitting: Adult Health

## 2020-04-29 LAB — BASIC METABOLIC PANEL
BUN/Creatinine Ratio: 11 (ref 10–24)
BUN: 14 mg/dL (ref 8–27)
CO2: 22 mmol/L (ref 20–29)
Calcium: 10.3 mg/dL — ABNORMAL HIGH (ref 8.6–10.2)
Chloride: 99 mmol/L (ref 96–106)
Creatinine, Ser: 1.33 mg/dL — ABNORMAL HIGH (ref 0.76–1.27)
GFR calc Af Amer: 64 mL/min/{1.73_m2} (ref >59)
GFR calc non Af Amer: 56 mL/min/{1.73_m2} — ABNORMAL LOW (ref >59)
Glucose: 126 mg/dL — ABNORMAL HIGH (ref 65–99)
Potassium: 4.8 mmol/L (ref 3.5–5.2)
Sodium: 138 mmol/L (ref 134–144)

## 2020-04-29 LAB — PRO B NATRIURETIC PEPTIDE: NT-Pro BNP: 522 pg/mL — ABNORMAL HIGH (ref 0–376)

## 2020-05-04 ENCOUNTER — Ambulatory Visit: Payer: Medicare Other | Admitting: Adult Health

## 2020-05-04 ENCOUNTER — Encounter: Payer: Self-pay | Admitting: Adult Health

## 2020-05-04 ENCOUNTER — Other Ambulatory Visit: Payer: Self-pay

## 2020-05-04 VITALS — BP 105/72 | HR 76 | Ht 74.0 in | Wt 220.0 lb

## 2020-05-04 DIAGNOSIS — G4733 Obstructive sleep apnea (adult) (pediatric): Secondary | ICD-10-CM | POA: Diagnosis not present

## 2020-05-04 DIAGNOSIS — Z9989 Dependence on other enabling machines and devices: Secondary | ICD-10-CM

## 2020-05-04 NOTE — Progress Notes (Signed)
PATIENT: Frank Berg DOB: 09-08-1956  REASON FOR VISIT: follow up HISTORY FROM: patient  HISTORY OF PRESENT ILLNESS: Today 05/04/20:  [name] is a 64 year old male with a history of obstructive sleep apnea on CPAP.  His download indicates that he uses machine nightly for compliance of 100%.  He uses machine greater than 4 hours each night.  On average he uses his machine 9 hours and 18 minutes.  His residual AHI is 8 on 8 to 16 cm of water with EPR of 2.  Leak in the 95th percentile is 29.6 L/min.  Pressure in the 95th percentile is 12.1 L/min. she reports that he does change out his supplies regularly.  Also reports he has been having some shortness of breath.  Reports his cardiologist increase Lasix and potassium.  Reports that despite having shortness of breath he has been able to go to the gym to exercise.  He returns today for an evaluation.  HISTORY 04/10/19:  [name] is a 64 year old male with a history of obstructive sleep apnea on CPAP.  His CPAP download indicates that he uses his machine every night for compliance of 100%.  He uses machine greater than 4 hours each night.  On average he uses his machine 8 hours and 51 minutes.  His residual AHI is 4.2 on 8 to 16 cm of water with EPR of 3.  His leak in the 95th percentile is 23.  He is currently wearing the nasal mask.  States that occasionally he feels the mask leaking but typically is able to readjust.  Overall he feels that he is doing well.  He returns today for follow-up.   REVIEW OF SYSTEMS: Out of a complete 14 system review of symptoms, the patient complains only of the following symptoms, and all other reviewed systems are negative.  FSS 22 ESS 4  ALLERGIES: Allergies  Allergen Reactions  . Atorvastatin     Body aches  . Penicillins Swelling    Swollen face    HOME MEDICATIONS: Outpatient Medications Prior to Visit  Medication Sig Dispense Refill  . allopurinol (ZYLOPRIM) 300 MG tablet Take 450 mg  by mouth daily.    . clindamycin (CLEOCIN T) 1 % external solution     . colchicine 0.6 MG tablet Take 0.6 mg by mouth 2 (two) times daily.    . furosemide (LASIX) 20 MG tablet Take 20 mg by mouth daily.    . lansoprazole (PREVACID) 30 MG capsule Take 30 mg by mouth as needed.     . magnesium oxide (MAG-OX) 400 (241.3 Mg) MG tablet Take 400 mg by mouth daily.  3  . magnesium oxide (MAG-OX) 400 MG tablet Take 1 tablet by mouth daily.    . metFORMIN (GLUCOPHAGE) 500 MG tablet Take 500 mg by mouth 2 (two) times daily with a meal.    . Omega-3 Fatty Acids (FISH OIL) 1200 MG CPDR Take by mouth daily.    . potassium chloride (MICRO-K) 10 MEQ CR capsule Take 10 mEq by mouth 2 (two) times daily.    . predniSONE (DELTASONE) 10 MG tablet TAKE 1 TABLET BY MOUTH TWICE A DAY FOR 3 DAYS THEN 1 EVERY DAY FOR 3 DAYS 20 tablet 0  . rosuvastatin (CRESTOR) 5 MG tablet Take 5 mg by mouth every evening.    . tadalafil (CIALIS) 20 MG tablet Take 20 mg by mouth daily as needed.    Marland Kitchen telmisartan (MICARDIS) 40 MG tablet Take 40 mg by mouth daily.    Marland Kitchen  XARELTO 20 MG TABS tablet Take 20 mg by mouth every evening.    . febuxostat (ULORIC) 40 MG tablet Take 40 mg by mouth daily.     No facility-administered medications prior to visit.    PAST MEDICAL HISTORY: Past Medical History:  Diagnosis Date  . Atrial fibrillation ([place])   . Coronary artery disease   . Diabetes mellitus without complication ([place])   . Diabetic polyneuropathy ([place])   . Gout, unspecified   . Hypertension   . OSA on CPAP     PAST SURGICAL HISTORY: Past Surgical History:  Procedure Laterality Date  . ABLATION    . AORTIC VALVE REPLACEMENT  2011  . NECK SURGERY     spinal stenosis    FAMILY HISTORY: Family History  Problem Relation Age of Onset  . Congenital heart disease Mother   . Heart attack Father   . Cancer Sister   . Hypertension Brother   . Pancreatic cancer Brother     SOCIAL HISTORY: Social History   Socioeconomic  History  . Marital status: Married    Spouse name: Not on file  . Number of children: Not on file  . Years of education: Not on file  . Highest education level: Not on file  Occupational History  . Not on file  Tobacco Use  . Smoking status: Former Smoker    Quit date: 11/03/1981    Years since quitting: 38.5  . Smokeless tobacco: Never Used  Substance and Sexual Activity  . Alcohol use: Yes    Comment: occasional  . Drug use: No  . Sexual activity: Not on file  Other Topics Concern  . Not on file  Social History Narrative   Retired Engineer, structural from [place] Strain:   . Difficulty of Paying Living Expenses:   Food Insecurity:   . Worried About Charity fundraiser in the Last Year:   . Arboriculturist in the Last Year:   Transportation Needs:   . Film/video editor (Medical):   Marland Kitchen Lack of Transportation (Non-Medical):   Physical Activity:   . Days of Exercise per Week:   . Minutes of Exercise per Session:   Stress:   . Feeling of Stress :   Social Connections:   . Frequency of Communication with Friends and Family:   . Frequency of Social Gatherings with Friends and Family:   . Attends Religious Services:   . Active Member of Clubs or Organizations:   . Attends Archivist Meetings:   Marland Kitchen Marital Status:   Intimate Partner Violence:   . Fear of Current or Ex-Partner:   . Emotionally Abused:   Marland Kitchen Physically Abused:   . Sexually Abused:       PHYSICAL EXAM  Vitals:   05/04/20 1248  BP: 105/72  Pulse: 76  Weight: 220 lb (99.8 kg)  Height: 6\' 2"  (1.88 m)   Body mass index is 28.25 kg/m.  Generalized: Well developed, in no acute distress  Chest: Lungs clear to auscultation bilaterally  Neurological examination  Mentation: Alert oriented to time, place, history taking. Follows all commands speech and language fluent Cranial nerve II-XII: Extraocular movements were full, visual field were  full on confrontational test Head turning and shoulder shrug  were normal and symmetric. Motor: The motor testing reveals 5 over 5 strength of all 4 extremities. Good symmetric motor tone is noted throughout.  Sensory: Sensory testing is intact  to soft touch on all 4 extremities. No evidence of extinction is noted.  Gait and station: Gait is normal.    DIAGNOSTIC DATA (LABS, IMAGING, TESTING) - I reviewed patient records, labs, notes, testing and imaging myself where available.  Lab Results  Component Value Date   WBC 6.1 12/26/2017   HGB 15.6 12/26/2017   HCT 45.6 12/26/2017   MCV 88 12/26/2017   PLT 345 12/26/2017      Component Value Date/Time   NA 138 04/28/2020 1617   K 4.8 04/28/2020 1617   CL 99 04/28/2020 1617   CO2 22 04/28/2020 1617   GLUCOSE 126 (H) 04/28/2020 1617   GLUCOSE 125 (H) 11/03/2016 2225   BUN 14 04/28/2020 1617   CREATININE 1.33 (H) 04/28/2020 1617   CALCIUM 10.3 (H) 04/28/2020 1617   GFRNONAA 56 (L) 04/28/2020 1617   GFRAA 64 04/28/2020 1617      ASSESSMENT AND PLAN 64 y.o. year old male  has a past medical history of Atrial fibrillation ([place]), Coronary artery disease, Diabetes mellitus without complication ([place]), Diabetic polyneuropathy ([place]), Gout, unspecified, Hypertension, and OSA on CPAP. here with:  1. OSA on CPAP  - CPAP compliance excellent -Residual AHI is slightly elevated possibly due to leak? -Mask refitting ordered - Encourage patient to use CPAP nightly and > 4 hours each night - F/U in 1 year or sooner if needed   I spent 20 minutes of face-to-face and non-face-to-face time with patient.  This included previsit chart review, lab review, study review, order entry, electronic health record documentation, patient education.  Ward Givens, MSN, NP-C 05/04/2020, 12:50 PM [facility] [address] [place], [place] [address] [phone]

## 2020-05-04 NOTE — Progress Notes (Signed)
Order for cpap supplies sent to [facility] via community msg. Confirmation received that the order transmitted was successful.  

## 2020-05-04 NOTE — Patient Instructions (Signed)
Continue using CPAP nightly and greater than 4 hours each night.  °Mask refitting °If your symptoms worsen or you develop new symptoms please let us know.  ° °

## 2020-07-07 ENCOUNTER — Encounter: Payer: Self-pay | Admitting: Podiatry

## 2020-07-07 ENCOUNTER — Other Ambulatory Visit: Payer: Self-pay

## 2020-07-07 ENCOUNTER — Ambulatory Visit: Payer: Medicare Other | Admitting: Podiatry

## 2020-07-07 DIAGNOSIS — B351 Tinea unguium: Secondary | ICD-10-CM | POA: Diagnosis not present

## 2020-07-07 DIAGNOSIS — M79675 Pain in left toe(s): Secondary | ICD-10-CM

## 2020-07-07 DIAGNOSIS — M79674 Pain in right toe(s): Secondary | ICD-10-CM | POA: Diagnosis not present

## 2020-07-07 DIAGNOSIS — E1142 Type 2 diabetes mellitus with diabetic polyneuropathy: Secondary | ICD-10-CM | POA: Diagnosis not present

## 2020-07-09 NOTE — Progress Notes (Addendum)
Subjective: Frank Berg presents today preventative diabetic foot care and painful mycotic nails b/l that are difficult to trim. Pain interferes with ambulation. Aggravating factors include wearing enclosed shoe gear. Pain is relieved with periodic professional debridement.   He states he needs an appointment sooner than 3 months, because his toes become painful due to the length of the toenails.   [name], MD is patient's PCP.   Past Medical History:  Diagnosis Date  . Atrial fibrillation ([place])   . Coronary artery disease   . Diabetes mellitus without complication ([place])   . Diabetic polyneuropathy ([place])   . Gout, unspecified   . Hypertension   . OSA on CPAP      Current Outpatient Medications on File Prior to Visit  Medication Sig Dispense Refill  . allopurinol (ZYLOPRIM) 300 MG tablet Take 450 mg by mouth daily.    . clindamycin (CLEOCIN T) 1 % external solution     . colchicine 0.6 MG tablet Take 0.6 mg by mouth 2 (two) times daily.    . furosemide (LASIX) 20 MG tablet Take 20 mg by mouth daily.    . lansoprazole (PREVACID) 30 MG capsule Take 30 mg by mouth as needed.     . magnesium oxide (MAG-OX) 400 (241.3 Mg) MG tablet Take 400 mg by mouth daily.  3  . magnesium oxide (MAG-OX) 400 MG tablet Take 1 tablet by mouth daily.    . metFORMIN (GLUCOPHAGE) 500 MG tablet Take 500 mg by mouth 2 (two) times daily with a meal.    . Omega-3 Fatty Acids (FISH OIL) 1200 MG CPDR Take by mouth daily.    . potassium chloride (MICRO-K) 10 MEQ CR capsule Take 10 mEq by mouth 2 (two) times daily.    . predniSONE (DELTASONE) 10 MG tablet TAKE 1 TABLET BY MOUTH TWICE A DAY FOR 3 DAYS THEN 1 EVERY DAY FOR 3 DAYS 20 tablet 0  . rosuvastatin (CRESTOR) 5 MG tablet Take 5 mg by mouth every evening.    . tadalafil (CIALIS) 20 MG tablet Take 20 mg by mouth daily as needed.    Marland Kitchen telmisartan (MICARDIS) 40 MG tablet Take 40 mg by mouth daily.    Alveda Reasons 20 MG TABS tablet Take 20 mg by mouth every  evening.     No current facility-administered medications on file prior to visit.     Allergies  Allergen Reactions  . Atorvastatin     Body aches  . Penicillins Swelling    Swollen face    Objective: Frank Berg is a pleasant 64 y.o. y.o. Patient Race: Black or African American [2]  male in NAD. AAO x 3.  There were no vitals filed for this visit.  Vascular Examination: Neurovascular status unchanged b/l lower extremities. Capillary refill time to digits immediate b/l. Palpable DP pulses b/l. Palpable PT pulses b/l. Pedal hair absent b/l. Skin temperature gradient within normal limits b/l. No pain with calf compression b/l. No edema noted b/l.  Dermatological Examination: Pedal skin with normal turgor, texture and tone bilaterally. No open wounds bilaterally. No interdigital macerations bilaterally. Toenails 1-5 left, R hallux, R 2nd toe, R 4th toe and R 5th toe elongated, discolored, dystrophic, thickened, and crumbly with subungual debris and tenderness to dorsal palpation. Anonychia noted R 3rd toe. Nailbed(s) epithelialized.   Musculoskeletal: Normal muscle strength 5/5 to all lower extremity muscle groups bilaterally. No pain crepitus or joint limitation noted with ROM b/l. Hammertoes noted to the 2-5 bilaterally. Patient ambulates  independent of any assistive aids.  Neurological Examination: Pt has subjective symptoms of neuropathy. Protective sensation intact 5/5 intact bilaterally with 10g monofilament b/l. Vibratory sensation intact b/l. Proprioception intact bilaterally.  Assessment: 1. Pain due to onychomycosis of toenails of both feet   2. Diabetic peripheral neuropathy associated with type 2 diabetes mellitus ([place])     Plan: -Examined patient. -No new findings. No new orders. -Continue diabetic foot care principles. -Toenails 1-5 left, R hallux, R 2nd toe, R 4th toe and R 5th toe debrided in length and girth without iatrogenic bleeding with sterile nail  nipper and dremel.  -Patient to report any pedal injuries to medical professional immediately. -Patient to continue soft, supportive shoe gear daily. -Patient/POA to call should there be question/concern in the interim.  Return in about 10 weeks (around 09/15/2020) for diabetic foot care, painful mycotic toenails.  Marzetta Board, DPM

## 2020-07-13 ENCOUNTER — Ambulatory Visit: Payer: Medicare Other | Admitting: Podiatry

## 2020-08-10 ENCOUNTER — Encounter: Payer: Self-pay | Admitting: Internal Medicine

## 2020-09-05 NOTE — Progress Notes (Signed)
Cardiology Office Note   Date:  09/07/2020   ID:  Frank Berg, DOB [birth year], MRN [id number]  PCP:  Frank Bowen, MD    No chief complaint on file.  CAD  Wt Readings from Last 3 Encounters:  09/07/20 225 lb 6.4 oz (102.2 kg)  05/04/20 220 lb (99.8 kg)  02/13/20 224 lb 12.8 oz (102 kg)       History of Present Illness: Frank Berg is a 64 y.o. male  with a history of aortic valve replacement in 2011.  He had a heart murmur diagnosed at age 1. Presumably, this was from a bicuspid valve.   In 2011, he had an AVR at age 1. It was a bovine valve. He takes SBE prophylaxis with doxycycline. He had a stent placed in 2008. He had a one vessel CABG at the time of valve replacement. Chest burning was anginal sx.  He had AFib and subsequently had an AFib ablation in 12/17. No recurrences of the AFib.   He moved to [facility] from [place] to be in a warmer place with lower taxes.  He has been on Xarelto since that time. He wanted to come off Xarelto after ablation.  His sx with AFib were DOE. He could not sense palpitatons. He took amiodarone and metoprolol for a short time before the ablation.  Note from [facility]2018, Dr. Lubertha Berg states: "S/P cryoablation of arrhythmia  10/07/16: Cryoablation/PVI and RFA of baseline AFLutter Ablated to sinus Then transseptal to LA Isolated PVs  Frank Berg is doing very well after ablation for atrial flutter and fibrillation, maintaining sinus rhythm. He had his amiodarone stopped, and continues to maintain sinus.  We also discussed anticoagulation it is unclear as to whether is ever safe to come off anticoagulation when the risk score suggests a patient with atrial fibrillation should be on, and since he has only been off amiodarone for a month or 2 we will keep him on anticoagulation for the short term. Maybe at the year mark if he has no more atrial fibrillation we can contemplate coming off if he is willing to  accept the risk of recurrent, asymptomatic atrial fibrillation."  He saw Dr. Rayann Berg in 2019 regarding stopping anticoagulation: "We discussed that AHA/ACC/HRS guidelines would say that he should continue long term anticoagulation. We also discussed that there is in the guidelines allowance for monitoring post ablation with an implantable loop recorder. Risks and benefits of ILR were discussed at length today. I think that this would be a good option for him. If he has no afib on ILR then we could stop anticoagualtion and then resume if ever AF was detected."  He decided not to have ILR placed.   His son got sick with COVID in 2020 and was in the ICU.  Son recovered after being intubated.  In 01/2020, he had ortho issues that have limited walking.  He had dizziness with change in positions.    Since the last visit, he has had some increased shortness of breath.  He does play pickle ball regularly.    Denies : Chest pain. Dizziness. Leg edema. Nitroglycerin use. Orthopnea. Palpitations. Paroxysmal nocturnal dyspnea. Shortness of breath. Syncope.   Past Medical History:  Diagnosis Date  . Atrial fibrillation ([place])   . Coronary artery disease   . Diabetes mellitus without complication ([place])   . Diabetic polyneuropathy ([place])   . Gout, unspecified   . Hypertension   . OSA on CPAP     Past Surgical History:  Procedure Laterality Date  . ABLATION    . AORTIC VALVE REPLACEMENT  2011  . NECK SURGERY     spinal stenosis     Current Outpatient Medications  Medication Sig Dispense Refill  . allopurinol (ZYLOPRIM) 300 MG tablet Take 450 mg by mouth daily.    . clindamycin (CLEOCIN T) 1 % external solution     . colchicine 0.6 MG tablet Take 0.6 mg by mouth 2 (two) times daily.    . furosemide (LASIX) 20 MG tablet Take 20 mg by mouth daily.    . lansoprazole (PREVACID) 30 MG capsule Take 30 mg by mouth as needed.     . magnesium oxide (MAG-OX) 400 (241.3 Mg) MG tablet Take 400 mg by  mouth daily.  3  . magnesium oxide (MAG-OX) 400 MG tablet Take 1 tablet by mouth daily.    . metFORMIN (GLUCOPHAGE) 500 MG tablet Take 500 mg by mouth 2 (two) times daily with a meal.    . Omega-3 Fatty Acids (FISH OIL) 1200 MG CPDR Take by mouth daily.    . potassium chloride (MICRO-K) 10 MEQ CR capsule Take 10 mEq by mouth 2 (two) times daily.    . predniSONE (DELTASONE) 10 MG tablet TAKE 1 TABLET BY MOUTH TWICE A DAY FOR 3 DAYS THEN 1 EVERY DAY FOR 3 DAYS 20 tablet 0  . rosuvastatin (CRESTOR) 5 MG tablet Take 5 mg by mouth every evening.    . tadalafil (CIALIS) 20 MG tablet Take 20 mg by mouth daily as needed.    Marland Kitchen telmisartan (MICARDIS) 40 MG tablet Take 40 mg by mouth daily.    Alveda Reasons 20 MG TABS tablet Take 20 mg by mouth every evening.     No current facility-administered medications for this visit.    Allergies:   Atorvastatin and Penicillins    Social History:  The patient  reports that he quit smoking about 38 years ago. He has never used smokeless tobacco. He reports current alcohol use. He reports that he does not use drugs.   Family History:  The patient's family history includes Cancer in his sister; Congenital heart disease in his mother; Heart attack in his father; Hypertension in his brother; Pancreatic cancer in his brother.    ROS:  Please see the history of present illness.   Otherwise, review of systems are positive for DOE.   All other systems are reviewed and negative.    PHYSICAL EXAM: VS:  BP 106/78   Pulse 74   Ht 6\' 2"  (1.88 m)   Wt 225 lb 6.4 oz (102.2 kg)   SpO2 97%   BMI 28.94 kg/m  , BMI Body mass index is 28.94 kg/m. GEN: Well nourished, well developed, in no acute distress  HEENT: normal  Neck: no JVD, carotid bruits, or masses Cardiac: RRR; 2/6 systolic murmur, no rubs, or gallops,no edema  Respiratory:  clear to auscultation bilaterally, normal work of breathing GI: soft, nontender, nondistended, + BS MS: no deformity or atrophy  Skin:  warm and dry, no rash Neuro:  Strength and sensation are intact Psych: euthymic mood, full affect   EKG:   The ekg ordered today demonstrates NSR, RBBB, no change since 2020.    Recent Labs: 04/28/2020: BUN 14; Creatinine, Ser 1.33; NT-Pro BNP 522; Potassium 4.8; Sodium 138   Lipid Panel No results found for: CHOL, TRIG, HDL, CHOLHDL, VLDL, LDLCALC, LDLDIRECT   Other studies Reviewed: Additional studies/ records that were reviewed today with results demonstrating:  labs reviewed.   ASSESSMENT AND PLAN:  1. AVR: SBE prophylaxis.  2018 echo showed normal functioning valve.  Needs to go back to the dentist, has not been since [place].   2. CAD: PCI in 2008.  1 vessel CABG in 2011.  Given DOE, will plan for stress test.  Discussed the fact that cath will be needed if stress test is abnormal. Risk of cath discussed.  All questions answered.   3. Hyperlipidemia:LDL 92 in Jan 2020.  Needs lipids reviewed.  4. Anticoagulated: No bleeding with Xarelto.  Did have some blood in stool.  He is gong to ave a colonoscopy. Check CBC 5. AFib: in NSR today. S/p ablation for flutter.  6. DM: A1C 6.2 in 7/21. 7. SHOB: Increased since the last visit. Check BNP.   Current medicines are reviewed at length with the patient today.  The patient concerns regarding his medicines were addressed.  The following changes have been made:  No change  Labs/ tests ordered today include:  No orders of the defined types were placed in this encounter.   Recommend 150 minutes/week of aerobic exercise Low fat, low carb, high fiber diet recommended  Disposition:   FU for stress test   Signed, Larae Grooms, MD  09/07/2020 10:21 AM    [facility] [place], [place], [place]  [address] Phone: [phone]; Fax: [phone]

## 2020-09-07 ENCOUNTER — Encounter: Payer: Self-pay | Admitting: Interventional Cardiology

## 2020-09-07 ENCOUNTER — Other Ambulatory Visit: Payer: Self-pay

## 2020-09-07 ENCOUNTER — Ambulatory Visit: Payer: Medicare Other | Admitting: Interventional Cardiology

## 2020-09-07 VITALS — BP 106/78 | HR 74 | Ht 74.0 in | Wt 225.4 lb

## 2020-09-07 DIAGNOSIS — Z952 Presence of prosthetic heart valve: Secondary | ICD-10-CM

## 2020-09-07 DIAGNOSIS — E782 Mixed hyperlipidemia: Secondary | ICD-10-CM | POA: Diagnosis not present

## 2020-09-07 DIAGNOSIS — I25119 Atherosclerotic heart disease of native coronary artery with unspecified angina pectoris: Secondary | ICD-10-CM

## 2020-09-07 DIAGNOSIS — Z7901 Long term (current) use of anticoagulants: Secondary | ICD-10-CM | POA: Diagnosis not present

## 2020-09-07 DIAGNOSIS — E1159 Type 2 diabetes mellitus with other circulatory complications: Secondary | ICD-10-CM

## 2020-09-07 DIAGNOSIS — R0602 Shortness of breath: Secondary | ICD-10-CM

## 2020-09-07 DIAGNOSIS — I480 Paroxysmal atrial fibrillation: Secondary | ICD-10-CM | POA: Diagnosis not present

## 2020-09-07 MED ORDER — XARELTO 20 MG PO TABS: 20.0000 mg | ORAL_TABLET | Freq: Every evening | ORAL | 3 refills | Status: DC

## 2020-09-07 NOTE — Patient Instructions (Addendum)
Medication Instructions:   Your physician recommends that you continue on your current medications as directed. Please refer to the Current Medication list given to you today.  *If you need a refill on your cardiac medications before your next appointment, please call your pharmacy*   Lab Work: TODAY: BMET, BNP If you have labs (blood work) drawn today and your tests are completely normal, you will receive your results only by: Marland Kitchen MyChart Message (if you have MyChart) OR . A paper copy in the mail If you have any lab test that is abnormal or we need to change your treatment, we will call you to review the results.   Testing/Procedures: Your physician has requested that you have en exercise stress myoview. Please follow instruction sheet, as given.  Follow-Up: At [facility], you and your health needs are our priority.  As part of our continuing mission to provide you with exceptional heart care, we have created designated Provider Care Teams.  These Care Teams include your primary Cardiologist (physician) and Advanced Practice Providers (APPs -  Physician Assistants and Nurse Practitioners) who all work together to provide you with the care you need, when you need it.  We recommend signing up for the patient portal called "MyChart".  Sign up information is provided on this After Visit Summary.  MyChart is used to connect with patients for Virtual Visits (Telemedicine).  Patients are able to view lab/test results, encounter notes, upcoming appointments, etc.  Non-urgent messages can be sent to your provider as well.   To learn more about what you can do with MyChart, go to [email or web address].    Your next appointment:   1 year(s)  The format for your next appointment:   In Person  Provider:   You may see Larae Grooms, MD or one of the following Advanced Practice Providers on your designated Care Team:    Melina Copa, PA-C  Ermalinda Barrios, PA-C

## 2020-09-08 LAB — BASIC METABOLIC PANEL
BUN/Creatinine Ratio: 11 (ref 10–24)
BUN: 14 mg/dL (ref 8–27)
CO2: 23 mmol/L (ref 20–29)
Calcium: 10.1 mg/dL (ref 8.6–10.2)
Chloride: 100 mmol/L (ref 96–106)
Creatinine, Ser: 1.32 mg/dL — ABNORMAL HIGH (ref 0.76–1.27)
GFR calc Af Amer: 65 mL/min/{1.73_m2} (ref >59)
GFR calc non Af Amer: 56 mL/min/{1.73_m2} — ABNORMAL LOW (ref >59)
Glucose: 111 mg/dL — ABNORMAL HIGH (ref 65–99)
Potassium: 4.9 mmol/L (ref 3.5–5.2)
Sodium: 137 mmol/L (ref 134–144)

## 2020-09-08 LAB — PRO B NATRIURETIC PEPTIDE: NT-Pro BNP: 403 pg/mL — ABNORMAL HIGH (ref 0–376)

## 2020-09-10 ENCOUNTER — Other Ambulatory Visit: Payer: Self-pay

## 2020-09-10 DIAGNOSIS — I480 Paroxysmal atrial fibrillation: Secondary | ICD-10-CM

## 2020-09-23 ENCOUNTER — Telehealth (HOSPITAL_COMMUNITY): Payer: Self-pay | Admitting: *Deleted

## 2020-09-23 NOTE — Telephone Encounter (Signed)
Patient given detailed instructions per Myocardial Perfusion Study Information Sheet for the test on 09/28/20 at 10:30. Patient notified to arrive 15 minutes early and that it is imperative to arrive on time for appointment to keep from having the test rescheduled.  If you need to cancel or reschedule your appointment, please call the office within 24 hours of your appointment. . Patient verbalized understanding.Frank Berg

## 2020-09-24 ENCOUNTER — Other Ambulatory Visit (HOSPITAL_COMMUNITY)
Admission: RE | Admit: 2020-09-24 | Discharge: 2020-09-24 | Disposition: A | Discharge status: Home / Self Care | Payer: Medicare Other | Source: Ambulatory Visit | Attending: Interventional Cardiology | Admitting: Interventional Cardiology

## 2020-09-24 DIAGNOSIS — Z20822 Contact with and (suspected) exposure to covid-19: Secondary | ICD-10-CM | POA: Insufficient documentation

## 2020-09-24 DIAGNOSIS — Z01812 Encounter for preprocedural laboratory examination: Secondary | ICD-10-CM | POA: Insufficient documentation

## 2020-09-24 LAB — SARS CORONAVIRUS 2 (TAT 6-24 HRS): SARS Coronavirus 2: NEGATIVE

## 2020-09-28 ENCOUNTER — Other Ambulatory Visit: Payer: Self-pay

## 2020-09-28 ENCOUNTER — Telehealth: Payer: Self-pay | Admitting: Interventional Cardiology

## 2020-09-28 ENCOUNTER — Other Ambulatory Visit: Payer: Medicare Other | Admitting: *Deleted

## 2020-09-28 ENCOUNTER — Ambulatory Visit (HOSPITAL_COMMUNITY): Payer: Medicare Other | Attending: Interventional Cardiology

## 2020-09-28 DIAGNOSIS — I25119 Atherosclerotic heart disease of native coronary artery with unspecified angina pectoris: Secondary | ICD-10-CM | POA: Insufficient documentation

## 2020-09-28 DIAGNOSIS — R0602 Shortness of breath: Secondary | ICD-10-CM | POA: Diagnosis not present

## 2020-09-28 DIAGNOSIS — I480 Paroxysmal atrial fibrillation: Secondary | ICD-10-CM

## 2020-09-28 LAB — MYOCARDIAL PERFUSION IMAGING
Estimated workload: 10.1 METS
Exercise duration (min): 8 min
Exercise duration (sec): 0 s
LV dias vol: 145 mL (ref 62–150)
LV sys vol: 82 mL
MPHR: 154 {beats}/min
Peak HR: 134 {beats}/min
Percent HR: 87 %
Rest HR: 78 {beats}/min
SDS: 2
SRS: 2
SSS: 4
TID: 0.89

## 2020-09-28 LAB — CBC
Hematocrit: 43.1 % (ref 37.5–51.0)
Hemoglobin: 14.7 g/dL (ref 13.0–17.7)
MCH: 29.8 pg (ref 26.6–33.0)
MCHC: 34.1 g/dL (ref 31.5–35.7)
MCV: 87 fL (ref 79–97)
Platelets: 275 10*3/uL (ref 150–450)
RBC: 4.94 x10E6/uL (ref 4.14–5.80)
RDW: 14.9 % (ref 11.6–15.4)
WBC: 7.5 10*3/uL (ref 3.4–10.8)

## 2020-09-28 MED ORDER — TECHNETIUM TC 99M TETROFOSMIN IV KIT
31.5000 | PACK | Freq: Once | INTRAVENOUS | Status: AC | PRN
  Administered 2020-09-28: 31.5 via INTRAVENOUS
  Filled 2020-09-28: qty 32

## 2020-09-28 MED ORDER — TECHNETIUM TC 99M TETROFOSMIN IV KIT
10.9000 | PACK | Freq: Once | INTRAVENOUS | Status: AC | PRN
  Administered 2020-09-28: 10.9 via INTRAVENOUS
  Filled 2020-09-28: qty 11

## 2020-09-28 NOTE — Telephone Encounter (Signed)
Patient is requesting to review instructions for stress test scheduled for today, 09/620 at 10:30 AM. Please return call to discuss.

## 2020-09-28 NOTE — Telephone Encounter (Signed)
I will send call to [facility] Dept staff as it looks like Nuc dept left message to call back

## 2020-10-02 ENCOUNTER — Encounter: Payer: Self-pay | Admitting: Podiatry

## 2020-10-02 ENCOUNTER — Other Ambulatory Visit: Payer: Self-pay

## 2020-10-02 ENCOUNTER — Ambulatory Visit: Payer: Medicare Other | Admitting: Podiatry

## 2020-10-02 DIAGNOSIS — E1142 Type 2 diabetes mellitus with diabetic polyneuropathy: Secondary | ICD-10-CM

## 2020-10-02 DIAGNOSIS — M79674 Pain in right toe(s): Secondary | ICD-10-CM

## 2020-10-02 DIAGNOSIS — B351 Tinea unguium: Secondary | ICD-10-CM

## 2020-10-02 DIAGNOSIS — M79675 Pain in left toe(s): Secondary | ICD-10-CM

## 2020-10-09 NOTE — Progress Notes (Signed)
Subjective: Frank Berg presents today preventative diabetic foot care and painful mycotic nails b/l that are difficult to trim. Pain interferes with ambulation. Aggravating factors include wearing enclosed shoe gear. Pain is relieved with periodic professional debridement.   He states his blood glucose was 106 mg/dl this morning.    Frank Bowen, Frank Berg is patient's PCP. Last visit was last week.  Past Medical History:  Diagnosis Date  . Atrial fibrillation ([place])   . Coronary artery disease   . Diabetes mellitus without complication ([place])   . Diabetic polyneuropathy ([place])   . Gout, unspecified   . Hypertension   . OSA on CPAP      Current Outpatient Medications on File Prior to Visit  Medication Sig Dispense Refill  . allopurinol (ZYLOPRIM) 300 MG tablet Take 450 mg by mouth daily.    . clindamycin (CLEOCIN T) 1 % external solution     . colchicine 0.6 MG tablet Take 0.6 mg by mouth 2 (two) times daily.    . furosemide (LASIX) 20 MG tablet Take 20 mg by mouth daily.    . lansoprazole (PREVACID) 30 MG capsule Take 30 mg by mouth as needed.     . magnesium oxide (MAG-OX) 400 (241.3 Mg) MG tablet Take 400 mg by mouth daily.  3  . magnesium oxide (MAG-OX) 400 MG tablet Take 1 tablet by mouth daily.    . metFORMIN (GLUCOPHAGE) 500 MG tablet Take 500 mg by mouth 2 (two) times daily with a meal.    . Omega-3 Fatty Acids (FISH OIL) 1200 MG CPDR Take by mouth daily.    . potassium chloride (MICRO-K) 10 MEQ CR capsule Take 10 mEq by mouth 2 (two) times daily.    . predniSONE (DELTASONE) 10 MG tablet TAKE 1 TABLET BY MOUTH TWICE A DAY FOR 3 DAYS THEN 1 EVERY DAY FOR 3 DAYS 20 tablet 0  . rosuvastatin (CRESTOR) 5 MG tablet Take 5 mg by mouth every evening.    . tadalafil (CIALIS) 20 MG tablet Take 20 mg by mouth daily as needed.    Marland Kitchen telmisartan (MICARDIS) 40 MG tablet Take 40 mg by mouth daily.    Alveda Reasons 20 MG TABS tablet Take 1 tablet (20 mg total) by mouth every evening. 90 tablet 3    No current facility-administered medications on file prior to visit.     Allergies  Allergen Reactions  . Atorvastatin     Body aches  . Penicillins Swelling    Swollen face    Objective: Frank Berg is a pleasant 64 y.o. y.o. Patient Race: Black or African American [2]  male in NAD. AAO x 3.  There were no vitals filed for this visit.  Vascular Examination: Neurovascular status unchanged b/l lower extremities. Capillary refill time to digits immediate b/l. Palpable DP pulses b/l. Palpable PT pulses b/l. Pedal hair absent b/l. Skin temperature gradient within normal limits b/l. No pain with calf compression b/l. No edema noted b/l.  Dermatological Examination: Pedal skin with normal turgor, texture and tone bilaterally. No open wounds bilaterally. No interdigital macerations bilaterally. Toenails 1-5 left, R hallux, R 2nd toe, R 4th toe and R 5th toe elongated, discolored, dystrophic, thickened, and crumbly with subungual debris and tenderness to dorsal palpation. Anonychia noted R 3rd toe. Nailbed(s) epithelialized.   Musculoskeletal: Normal muscle strength 5/5 to all lower extremity muscle groups bilaterally. No pain crepitus or joint limitation noted with ROM b/l. Hammertoes noted to the 2-5 bilaterally. Patient ambulates independent of any  assistive aids.  Neurological Examination: Pt has subjective symptoms of neuropathy. Protective sensation intact 5/5 intact bilaterally with 10g monofilament b/l. Vibratory sensation intact b/l. Proprioception intact bilaterally.  Assessment: 1. Pain due to onychomycosis of toenails of both feet   2. Diabetic peripheral neuropathy associated with type 2 diabetes mellitus ([place])     Plan: -Examined patient. -No new findings. No new orders. -Continue diabetic foot care principles. -Toenails 1-5 left, R hallux, R 2nd toe, R 4th toe and R 5th toe debrided in length and girth without iatrogenic bleeding with sterile nail nipper and  dremel.  -Patient to report any pedal injuries to medical professional immediately. -Patient to continue soft, supportive shoe gear daily. -Patient/POA to call should there be question/concern in the interim.  Return in about 3 months (around 12/31/2020) for diabetic foot care.  Marzetta Board, DPM

## 2020-10-12 ENCOUNTER — Telehealth: Payer: Self-pay

## 2020-10-12 ENCOUNTER — Ambulatory Visit (INDEPENDENT_AMBULATORY_CARE_PROVIDER_SITE_OTHER): Payer: Medicare Other | Admitting: Internal Medicine

## 2020-10-12 ENCOUNTER — Encounter: Payer: Self-pay | Admitting: Internal Medicine

## 2020-10-12 VITALS — BP 98/60 | HR 80 | Ht 72.0 in | Wt 223.0 lb

## 2020-10-12 DIAGNOSIS — I4891 Unspecified atrial fibrillation: Secondary | ICD-10-CM | POA: Diagnosis not present

## 2020-10-12 DIAGNOSIS — R195 Other fecal abnormalities: Secondary | ICD-10-CM

## 2020-10-12 DIAGNOSIS — Z7901 Long term (current) use of anticoagulants: Secondary | ICD-10-CM | POA: Diagnosis not present

## 2020-10-12 MED ORDER — PEG-KCL-NACL-NASULF-NA ASC-C 100 G PO SOLR: 1.0000 | ORAL | 0 refills | Status: DC

## 2020-10-12 NOTE — Telephone Encounter (Signed)
Patient with diagnosis of afib on Xarelto for anticoagulation.    Procedure:  colonoscopy Date of procedure: 11/25/2020    CHA2DS2-VASc Score = 5  This indicates a 7.2% annual risk of stroke. The patient's score is based upon: CHF History: Yes HTN History: Yes Diabetes History: Yes Stroke History: No Vascular Disease History: Yes Age Score: 1 Gender Score: 0     CrCl 67.7 ml/min  Per office protocol, patient can hold Xarelto for 2 days prior to procedure.

## 2020-10-12 NOTE — Patient Instructions (Signed)
Normal BMI (Body Mass Index- based on height and weight) is between 23 and 30. Your BMI today is Body mass index is 30.24 kg/m. Marland Kitchen Please consider follow up  regarding your BMI with your Primary Care Provider.  You have been scheduled for a colonoscopy. Please follow written instructions given to you at your visit today.  Please pick up your prep supplies at the pharmacy within the next 1-3 days. If you use inhalers (even only as needed), please bring them with you on the day of your procedure.  You will be contacted by our office prior to your procedure for directions on holding your Xarelto.  If you do not hear from our office 1 week prior to your scheduled procedure, please call [phone] to discuss.     I appreciate the opportunity to care for you. Silvano Rusk, MD, [facility]

## 2020-10-12 NOTE — Telephone Encounter (Signed)
   Primary Cardiologist: Larae Grooms, MD  Chart reviewed as part of pre-operative protocol coverage. Given past medical history and time since last visit, based on ACC/AHA guidelines, Jamis Kryder would be at acceptable risk for the planned procedure without further cardiovascular testing.   Patient with diagnosis of afib on Xarelto for anticoagulation.    Procedure: colonoscopy Date of procedure: 11/25/2020    CHA2DS2-VASc Score = 5  This indicates a 7.2% annual risk of stroke. The patient's score is based upon: CHF History: Yes HTN History: Yes Diabetes History: Yes Stroke History: No Vascular Disease History: Yes Age Score: 1 Gender Score: 0     CrCl 67.7 ml/min  Per office protocol, patient can hold Xarelto for 2 days prior to procedure.  I will route this recommendation to the requesting party via Epic fax function and remove from pre-op pool.  Please call with questions.  Jossie Ng. Sarabelle Genson NP-C    10/12/2020, 1:08 PM [facility] [address] Office [phone] Fax [phone]

## 2020-10-12 NOTE — Telephone Encounter (Signed)
[facility] Pre-operative Risk Assessment     Request for surgical clearance:     Endoscopy Procedure  What type of surgery is being performed?     colonoscopy  When is this surgery scheduled?     11/25/2020  What type of clearance is required ?   Pharmacy  Are there any medications that need to be held prior to surgery and how long? Xarelto, 2 days  Practice name and name of physician performing surgery?      [facility]  What is your office phone and fax number?      Phone- [phone]  [phone]  Anesthesia type (None, local, MAC, general) ?       MAC

## 2020-10-12 NOTE — Progress Notes (Signed)
Frank Berg 64 y.o. 06-24-1956 [id number]  Assessment & Plan:   Encounter Diagnoses  Name Primary?  . Heme + stool Yes  . Long term current use of anticoagulant - Xarelto   . Atrial fibrillation, unspecified type (Agenda)     Evaluate with colonoscopy  Hold Xarelto 2 days prior to colonoscopy as long as acceptable with cardiology it should be.  The risks and benefits as well as alternatives of endoscopic procedure(s) have been discussed and reviewed. All questions answered. The patient agrees to proceed. Rare but real extra risk of stroke off Xarelto explained he understands and accepts.  I appreciate the opportunity to care for this patient. CC: [name], MD   Subjective:   Chief Complaint: Heme positive stool  HPI This 64 year old man had an I FOBT positive stool at Dr. Baldwin Crown office.  No rectal bleeding no change in bowel habits.  Colonoscopy 10 or 15 years ago in [place].  He moved here a few years ago for better weather lower taxes and nicer people he says.  Recently had some dyspnea on exertion though he is able to do his line dancing and pickleball without difficulty, he had a negative pharmacologic stress test. Allergies  Allergen Reactions  . Lipitor [Atorvastatin]     Body aches  . Penicillins Swelling    Swollen face   Current Meds  Medication Sig  . allopurinol (ZYLOPRIM) 300 MG tablet Take 450 mg by mouth daily.  . clindamycin (CLEOCIN T) 1 % external solution   . colchicine 0.6 MG tablet Take 0.6 mg by mouth 2 (two) times daily.  . furosemide (LASIX) 20 MG tablet Take 20 mg by mouth daily.  . magnesium oxide (MAG-OX) 400 MG tablet Take 1 tablet by mouth daily.  . metFORMIN (GLUCOPHAGE) 500 MG tablet Take 500 mg by mouth 2 (two) times daily with a meal.  . Omega-3 Fatty Acids (FISH OIL) 1200 MG CPDR Take by mouth daily.  . potassium chloride (MICRO-K) 10 MEQ CR capsule Take 10 mEq by mouth 2 (two) times daily.  . rosuvastatin (CRESTOR) 5 MG  tablet Take 5 mg by mouth every evening.  . tadalafil (CIALIS) 20 MG tablet Take 20 mg by mouth daily as needed.  Marland Kitchen telmisartan (MICARDIS) 40 MG tablet Take 40 mg by mouth daily.  Alveda Reasons 20 MG TABS tablet Take 1 tablet (20 mg total) by mouth every evening.   Past Medical History:  Diagnosis Date  . Atrial fibrillation ([place])   . Coronary artery disease   . Diabetes mellitus without complication ([place])   . Diabetic polyneuropathy ([place])   . GERD (gastroesophageal reflux disease)   . Gout, unspecified   . HLD (hyperlipidemia)   . Hypertension   . OSA on CPAP    Past Surgical History:  Procedure Laterality Date  . ABLATION    . AORTIC VALVE REPLACEMENT  2011  . CERVICAL DISC SURGERY     spinal stenosis  . COLONOSCOPY    . ESOPHAGOGASTRODUODENOSCOPY    . 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   Review of Systems As per HPI.  Some skin irritation.  Otherwise negative.  Objective:   Physical Exam BP 98/60 (BP Location: Left Arm, Patient Position: Sitting, Cuff Size: Normal)   Pulse 80   Ht 6' (1.829 m) Comment: height measured without shoes  Wt 223 lb (101.2 kg)   BMI 30.24 kg/m  NAD Elderly  bm NAD Lungs cta Cor NL s1s2 no rmg abd soft and NT Patient is alert and oriented x3 with a pleasant and appropriate mood and affect

## 2020-10-13 ENCOUNTER — Encounter: Payer: Self-pay | Admitting: Internal Medicine

## 2020-10-13 ENCOUNTER — Telehealth: Payer: Self-pay

## 2020-10-13 NOTE — Telephone Encounter (Signed)
We got clearance for patient to hold his xarelto 2 days, see previous phone note. I have left a message on patient's voice mail to call me.

## 2020-10-15 NOTE — Telephone Encounter (Signed)
Patient informed and verbalized understanding

## 2020-11-25 ENCOUNTER — Ambulatory Visit (AMBULATORY_SURGERY_CENTER): Payer: Medicare Other | Admitting: Internal Medicine

## 2020-11-25 ENCOUNTER — Encounter: Payer: Self-pay | Admitting: Internal Medicine

## 2020-11-25 ENCOUNTER — Other Ambulatory Visit: Payer: Self-pay

## 2020-11-25 VITALS — BP 95/47 | HR 69 | Temp 96.9°F | Resp 22 | Ht 72.0 in | Wt 223.0 lb

## 2020-11-25 DIAGNOSIS — D123 Benign neoplasm of transverse colon: Secondary | ICD-10-CM

## 2020-11-25 DIAGNOSIS — R195 Other fecal abnormalities: Secondary | ICD-10-CM | POA: Diagnosis not present

## 2020-11-25 DIAGNOSIS — K648 Other hemorrhoids: Secondary | ICD-10-CM | POA: Diagnosis not present

## 2020-11-25 MED ORDER — SODIUM CHLORIDE 0.9 % IV SOLN: 500.0000 mL | INTRAVENOUS | Status: DC

## 2020-11-25 NOTE — Progress Notes (Signed)
1516 Ephedrine 10 mg given IV due to low BP, MD updated.

## 2020-11-25 NOTE — Op Note (Signed)
[place] Patient Name: Frank Berg Procedure Date: 11/25/2020 2:50 PM MRN: [id number] Endoscopist: Gatha Mayer , MD Age: 65 Referring MD:  Date of Birth: 20-Mar-1956 Gender: Male Account #: [id number] Procedure:                Colonoscopy Indications:              Positive fecal immunochemical test Medicines:                Propofol per Anesthesia, Monitored Anesthesia Care Procedure:                Pre-Anesthesia Assessment:                           - Prior to the procedure, a History and Physical                            was performed, and patient medications and                            allergies were reviewed. The patient's tolerance of                            previous anesthesia was also reviewed. The risks                            and benefits of the procedure and the sedation                            options and risks were discussed with the patient.                            All questions were answered, and informed consent                            was obtained. Prior Anticoagulants: The patient                            last took Xarelto (rivaroxaban) 2 days prior to the                            procedure. ASA Grade Assessment: III - A patient                            with severe systemic disease. After reviewing the                            risks and benefits, the patient was deemed in                            satisfactory condition to undergo the procedure.                           After obtaining informed consent, the colonoscope  was passed under direct vision. Throughout the                            procedure, the patient's blood pressure, pulse, and                            oxygen saturations were monitored continuously. The                            Olympus CF-HQ190 [id number]) Colonoscope was                            introduced through the anus and advanced to the the                             cecum, identified by appendiceal orifice and                            ileocecal valve. The colonoscopy was performed with                            difficulty due to a redundant colon and significant                            looping. Successful completion of the procedure was                            aided by changing the patient to a supine position                            and applying abdominal pressure. The bowel                            preparation used was Miralax via split dose                            instruction. The quality of the bowel preparation                            was adequate. The ileocecal valve and the rectum                            were photographed. Scope In: 3:07:21 PM Scope Out: 3:49:59 PM Scope Withdrawal Time: 0 hours 27 minutes 50 seconds  Total Procedure Duration: 0 hours 42 minutes 38 seconds  Findings:                 The perianal and digital rectal examinations were                            normal. Pertinent negatives include normal prostate                            (size, shape, and consistency).  A diminutive polyp was found in the transverse                            colon. The polyp was sessile. The polyp was removed                            with a cold snare. Resection and retrieval were                            complete. Verification of patient identification                            for the specimen was done. Estimated blood loss was                            minimal.                           External and internal hemorrhoids were found.                           The sigmoid colon was grossly redundant.                           The exam was otherwise without abnormality on                            direct and retroflexion views. Complications:            No immediate complications. Estimated Blood Loss:     Estimated blood loss was minimal. Impression:               - One diminutive polyp in  the transverse colon,                            removed with a cold snare. Resected and retrieved.                           - External and internal hemorrhoids.                           - Redundant colon. Extremely so. I could see cecum                            but not enter deeply despite pressure, abdominal                            binder, forceps tractiuon and supine position.                           - The examination was otherwise normal on direct                            and retroflexion views. Recommendation:           - Patient has  a contact number available for                            emergencies. The signs and symptoms of potential                            delayed complications were discussed with the                            patient. Return to normal activities tomorrow.                            Written discharge instructions were provided to the                            patient.                           - Resume previous diet.                           - Continue present medications.                           - Resume Xarelto (rivaroxaban) at prior dose                            tomorrow.                           - Repeat colonoscopy is possibly recommended. Given                            the difficulty of this one not sure - would                            definitely apply an abdominal binder prior and he                            needs better fiber and seed restriction + possible                            extra prep. Would consider CT colonoscopy. The                            colonoscopy date will be determined after pathology                            results from today's exam become available for                            review.                           Do not repeat hemoccults Gatha Mayer, MD 11/25/2020 4:02:36 PM This report has been signed electronically.

## 2020-11-25 NOTE — Progress Notes (Signed)
Called to room to assist during endoscopic procedure.  Patient ID and intended procedure confirmed with present staff. Received instructions for my participation in the procedure from the performing physician.  

## 2020-11-25 NOTE — Patient Instructions (Addendum)
I think the blood in the stool came from hemorrhoids.  I found and removed one tiny polyp.  You have a redundant or floppy colon - it is just the way you are built. However, that made it difficult to pass the scope and we pressed on your abdomen so you may be sore. Tylenol or a heating pad may be needed.  I will let you know pathology results and when to have another routine colonoscopy by mail and/or My Chart.  Restart Xarelto tomorrow.  I recommend that you do not do stool tests for blood anbymore.  Will consider a repeat colonoscopy in 7-10 years after I look at pathology.  I appreciate the opportunity to care for you. Gatha Mayer, MD, [facility]   Discharge instructions given. Handouts on polyps and hemorrhoids. Resume previous medications. YOU HAD AN ENDOSCOPIC PROCEDURE TODAY AT [facility]:   Refer to the procedure report that was given to you for any specific questions about what was found during the examination.  If the procedure report does not answer your questions, please call your gastroenterologist to clarify.  If you requested that your care partner not be given the details of your procedure findings, then the procedure report has been included in a sealed envelope for you to review at your convenience later.  YOU SHOULD EXPECT: Some feelings of bloating in the abdomen. Passage of more gas than usual.  Walking can help get rid of the air that was put into your GI tract during the procedure and reduce the bloating. If you had a lower endoscopy (such as a colonoscopy or flexible sigmoidoscopy) you may notice spotting of blood in your stool or on the toilet paper. If you underwent a bowel prep for your procedure, you may not have a normal bowel movement for a few days.  Please Note:  You might notice some irritation and congestion in your nose or some drainage.  This is from the oxygen used during your procedure.  There is no need for concern and it should clear up  in a day or so.  SYMPTOMS TO REPORT IMMEDIATELY:   Following lower endoscopy (colonoscopy or flexible sigmoidoscopy):  Excessive amounts of blood in the stool  Significant tenderness or worsening of abdominal pains  Swelling of the abdomen that is new, acute  Fever of 100F or higher   For urgent or emergent issues, a gastroenterologist can be reached at any hour by calling [phone]. Do not use MyChart messaging for urgent concerns.    DIET:  We do recommend a small meal at first, but then you may proceed to your regular diet.  Drink plenty of fluids but you should avoid alcoholic beverages for 24 hours.  ACTIVITY:  You should plan to take it easy for the rest of today and you should NOT DRIVE or use heavy machinery until tomorrow (because of the sedation medicines used during the test).    FOLLOW UP: Our staff will call the number listed on your records 48-72 hours following your procedure to check on you and address any questions or concerns that you may have regarding the information given to you following your procedure. If we do not reach you, we will leave a message.  We will attempt to reach you two times.  During this call, we will ask if you have developed any symptoms of COVID 19. If you develop any symptoms (ie: fever, flu-like symptoms, shortness of breath, cough etc.) before then, please call (  [phone].  If you test positive for Covid 19 in the 2 weeks post procedure, please call and report this information to [place].    [name] biopsies were taken you will be contacted by phone or by letter within the next 1-3 weeks.  Please call us at [phone] if you have not heard about the biopsies in 3 weeks.    SIGNATURES/CONFIDENTIALITY: You and/or your care partner have signed paperwork which will be entered into your electronic medical record.  These signatures attest to the fact that that the information above on your After Visit Summary has been reviewed and is  understood.  Full responsibility of the confidentiality of this discharge information lies with you and/or your care-partner.

## 2020-11-25 NOTE — Progress Notes (Signed)
Report given to PACU, vss 

## 2020-11-27 ENCOUNTER — Telehealth: Payer: Self-pay

## 2020-11-27 NOTE — Telephone Encounter (Signed)
LVM

## 2020-12-03 ENCOUNTER — Encounter: Payer: Self-pay | Admitting: Internal Medicine

## 2020-12-03 DIAGNOSIS — Z86010 Personal history of colonic polyps: Secondary | ICD-10-CM | POA: Insufficient documentation

## 2020-12-03 DIAGNOSIS — Z860101 Personal history of adenomatous and serrated colon polyps: Secondary | ICD-10-CM

## 2020-12-03 HISTORY — DX: Personal history of colonic polyps: Z86.010

## 2020-12-03 HISTORY — DX: Personal history of adenomatous and serrated colon polyps: Z86.0101

## 2020-12-14 ENCOUNTER — Other Ambulatory Visit (HOSPITAL_COMMUNITY): Payer: Self-pay | Admitting: Endocrinology

## 2020-12-14 ENCOUNTER — Other Ambulatory Visit: Payer: Self-pay

## 2020-12-14 ENCOUNTER — Ambulatory Visit (HOSPITAL_BASED_OUTPATIENT_CLINIC_OR_DEPARTMENT_OTHER)
Admission: RE | Admit: 2020-12-14 | Discharge: 2020-12-14 | Disposition: A | Discharge status: Home / Self Care | Payer: Medicare Other | Source: Ambulatory Visit | Attending: Endocrinology | Admitting: Endocrinology

## 2020-12-14 ENCOUNTER — Encounter (HOSPITAL_BASED_OUTPATIENT_CLINIC_OR_DEPARTMENT_OTHER): Payer: Self-pay

## 2020-12-14 DIAGNOSIS — R0602 Shortness of breath: Secondary | ICD-10-CM | POA: Diagnosis not present

## 2020-12-14 DIAGNOSIS — R042 Hemoptysis: Secondary | ICD-10-CM | POA: Diagnosis present

## 2020-12-14 MED ORDER — IOHEXOL 300 MG/ML  SOLN
80.0000 mL | Freq: Once | INTRAMUSCULAR | Status: AC | PRN
  Administered 2020-12-14: 80 mL via INTRAVENOUS

## 2020-12-17 ENCOUNTER — Telehealth: Payer: Self-pay | Admitting: Adult Health

## 2020-12-17 DIAGNOSIS — G4733 Obstructive sleep apnea (adult) (pediatric): Secondary | ICD-10-CM

## 2020-12-17 DIAGNOSIS — Z9989 Dependence on other enabling machines and devices: Secondary | ICD-10-CM

## 2020-12-17 NOTE — Telephone Encounter (Signed)
Pt states he changed his mask and hose due to bad readings on his CPAP, pt states after doing so he still got bad readings.  Pt asking for a call to be advised on what to do

## 2020-12-17 NOTE — Telephone Encounter (Signed)
cpap dl placed inbox.  Compliance good, AHI 14.3

## 2020-12-22 NOTE — Telephone Encounter (Signed)
Please advise patient that I have increased his pressure 8 to 18 cm of water this is already been done and ResMed order placed.

## 2020-12-22 NOTE — Telephone Encounter (Signed)
I called and relayed to pt in VM that MM/NP did change the pressure and hopefully will help.  If questions to let us know.

## 2020-12-22 NOTE — Addendum Note (Signed)
Addended by: Trudie Buckler on: 12/22/2020 03:56 PM   Modules accepted: Orders

## 2020-12-29 NOTE — Telephone Encounter (Signed)
I left a voicemail that I had no record of previous question about blood in the saliva and that if I overlooked that and it was not recorded by her staff I was sorry.  I told him that I still needed to clarify with him as blood in the saliva is not necessarily coming from the gastrointestinal tract.  I will try to speak to him again.

## 2021-01-04 ENCOUNTER — Ambulatory Visit: Payer: Medicare Other | Admitting: Podiatry

## 2021-01-05 DIAGNOSIS — H6123 Impacted cerumen, bilateral: Secondary | ICD-10-CM | POA: Insufficient documentation

## 2021-01-05 DIAGNOSIS — Z7901 Long term (current) use of anticoagulants: Secondary | ICD-10-CM | POA: Insufficient documentation

## 2021-02-08 ENCOUNTER — Telehealth: Payer: Self-pay | Admitting: Interventional Cardiology

## 2021-02-08 DIAGNOSIS — R0602 Shortness of breath: Secondary | ICD-10-CM

## 2021-02-08 NOTE — Telephone Encounter (Signed)
Pt c/o Shortness Of Breath: STAT if SOB developed within the last 24 hours or pt is noticeably SOB on the phone  1. Are you currently SOB (can you hear that pt is SOB on the phone) Not at this Moment.  He is sitting right now.    2. How long have you been experiencing SOB? For several months but has gotten worse within the last couple weeks   3. Are you SOB when sitting or when up moving around? He stated it is only when he is up moving around   4. Are you currently experiencing any other symptoms? He stated that his right leg and foot is a little swollen.  Pt stated he has had some nausea and lightlessness.    Pt has not taken his bp , He is not able to take his bp at home.    Best number [phone]

## 2021-02-08 NOTE — Telephone Encounter (Signed)
Called and spoke to patient. He states that he has had intermittent episodes of DOE and lightheadedness for 3 weeks. Denies chest pain, syncope, or weight gain. He states that he has some swelling in his right foot. Denies redness or pain. He is still able to play pickleball, but cannot go as hard. He states that his pulse does feel irregular. He has a Hx of Afib/flutter s/p ablation in 2017. He has not missed any doses of Xarelto. 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 He has been taking lasix 20 mg QD and occasionally taking an extra 20 mg QD that has not helped. Denies increased salt intake. He was able to check his BP and pulse while we were on the phone- BP: 143/83 HR: 63. Made patient aware that I will forward to Dr. Irish Lack to see if he would like to repeat his echo and see if he has any other recommendations. Instructed the patient to let us know if his Sx changed or worsened. He verbalized understanding and thanked me for the call.

## 2021-02-09 ENCOUNTER — Other Ambulatory Visit: Payer: Self-pay

## 2021-02-09 ENCOUNTER — Other Ambulatory Visit: Payer: Medicare Other | Admitting: *Deleted

## 2021-02-09 DIAGNOSIS — R0602 Shortness of breath: Secondary | ICD-10-CM

## 2021-02-09 NOTE — Telephone Encounter (Signed)
Repeat echo.  BMet BNP for [facility].  Thanks.  JV

## 2021-02-09 NOTE — Telephone Encounter (Signed)
Informed patient of results and verbal understanding expressed. Pt will stop by the office today for blood work. Aware office will call to arrange echo

## 2021-02-10 LAB — BASIC METABOLIC PANEL
BUN/Creatinine Ratio: 12 (ref 10–24)
BUN: 17 mg/dL (ref 8–27)
CO2: 22 mmol/L (ref 20–29)
Calcium: 10 mg/dL (ref 8.6–10.2)
Chloride: 99 mmol/L (ref 96–106)
Creatinine, Ser: 1.45 mg/dL — ABNORMAL HIGH (ref 0.76–1.27)
Glucose: 80 mg/dL (ref 65–99)
Potassium: 5.1 mmol/L (ref 3.5–5.2)
Sodium: 137 mmol/L (ref 134–144)
eGFR: 53 mL/min/{1.73_m2} — ABNORMAL LOW (ref >59)

## 2021-02-10 LAB — PRO B NATRIURETIC PEPTIDE: NT-Pro BNP: 792 pg/mL — ABNORMAL HIGH (ref 0–376)

## 2021-02-11 ENCOUNTER — Other Ambulatory Visit: Payer: Self-pay

## 2021-02-11 ENCOUNTER — Observation Stay (HOSPITAL_COMMUNITY)
Admission: EM | Admit: 2021-02-11 | Discharge: 2021-02-13 | Disposition: A | Discharge status: Home / Self Care | Payer: Medicare Other | Attending: Internal Medicine | Admitting: Internal Medicine

## 2021-02-11 ENCOUNTER — Encounter (HOSPITAL_COMMUNITY): Payer: Self-pay

## 2021-02-11 ENCOUNTER — Emergency Department (HOSPITAL_COMMUNITY): Payer: Medicare Other

## 2021-02-11 DIAGNOSIS — Z7901 Long term (current) use of anticoagulants: Secondary | ICD-10-CM | POA: Diagnosis not present

## 2021-02-11 DIAGNOSIS — R042 Hemoptysis: Secondary | ICD-10-CM

## 2021-02-11 DIAGNOSIS — I130 Hypertensive heart and chronic kidney disease with heart failure and stage 1 through stage 4 chronic kidney disease, or unspecified chronic kidney disease: Secondary | ICD-10-CM | POA: Diagnosis not present

## 2021-02-11 DIAGNOSIS — N289 Disorder of kidney and ureter, unspecified: Secondary | ICD-10-CM

## 2021-02-11 DIAGNOSIS — Z79899 Other long term (current) drug therapy: Secondary | ICD-10-CM | POA: Diagnosis not present

## 2021-02-11 DIAGNOSIS — R55 Syncope and collapse: Secondary | ICD-10-CM | POA: Diagnosis present

## 2021-02-11 DIAGNOSIS — Z87891 Personal history of nicotine dependence: Secondary | ICD-10-CM | POA: Diagnosis not present

## 2021-02-11 DIAGNOSIS — E1122 Type 2 diabetes mellitus with diabetic chronic kidney disease: Secondary | ICD-10-CM | POA: Diagnosis not present

## 2021-02-11 DIAGNOSIS — G4733 Obstructive sleep apnea (adult) (pediatric): Secondary | ICD-10-CM

## 2021-02-11 DIAGNOSIS — Z20822 Contact with and (suspected) exposure to covid-19: Secondary | ICD-10-CM | POA: Diagnosis not present

## 2021-02-11 DIAGNOSIS — I480 Paroxysmal atrial fibrillation: Secondary | ICD-10-CM | POA: Insufficient documentation

## 2021-02-11 DIAGNOSIS — I2510 Atherosclerotic heart disease of native coronary artery without angina pectoris: Secondary | ICD-10-CM | POA: Insufficient documentation

## 2021-02-11 DIAGNOSIS — Z7984 Long term (current) use of oral hypoglycemic drugs: Secondary | ICD-10-CM | POA: Insufficient documentation

## 2021-02-11 DIAGNOSIS — I5032 Chronic diastolic (congestive) heart failure: Secondary | ICD-10-CM | POA: Diagnosis not present

## 2021-02-11 DIAGNOSIS — R778 Other specified abnormalities of plasma proteins: Secondary | ICD-10-CM

## 2021-02-11 DIAGNOSIS — N182 Chronic kidney disease, stage 2 (mild): Secondary | ICD-10-CM | POA: Diagnosis not present

## 2021-02-11 LAB — CBC WITH DIFFERENTIAL/PLATELET
Abs Immature Granulocytes: 0.01 10*3/uL (ref 0.00–0.07)
Basophils Absolute: 0.1 10*3/uL (ref 0.0–0.1)
Basophils Relative: 1 %
Eosinophils Absolute: 0.1 10*3/uL (ref 0.0–0.5)
Eosinophils Relative: 1 %
HCT: 45.4 % (ref 39.0–52.0)
Hemoglobin: 14.9 g/dL (ref 13.0–17.0)
Immature Granulocytes: 0 %
Lymphocytes Relative: 46 %
Lymphs Abs: 2.7 10*3/uL (ref 0.7–4.0)
MCH: 30.3 pg (ref 26.0–34.0)
MCHC: 32.8 g/dL (ref 30.0–36.0)
MCV: 92.3 fL (ref 80.0–100.0)
Monocytes Absolute: 0.4 10*3/uL (ref 0.1–1.0)
Monocytes Relative: 7 %
Neutro Abs: 2.7 10*3/uL (ref 1.7–7.7)
Neutrophils Relative %: 45 %
Platelets: 246 10*3/uL (ref 150–400)
RBC: 4.92 MIL/uL (ref 4.22–5.81)
RDW: 14.7 % (ref 11.5–15.5)
WBC: 5.9 10*3/uL (ref 4.0–10.5)
nRBC: 0 % (ref 0.0–0.2)

## 2021-02-11 LAB — BASIC METABOLIC PANEL
Anion gap: 9 (ref 5–15)
BUN: 26 mg/dL — ABNORMAL HIGH (ref 8–23)
CO2: 23 mmol/L (ref 22–32)
Calcium: 9.3 mg/dL (ref 8.9–10.3)
Chloride: 101 mmol/L (ref 98–111)
Creatinine, Ser: 1.3 mg/dL — ABNORMAL HIGH (ref 0.61–1.24)
GFR, Estimated: >60 mL/min (ref >60)
Glucose, Bld: 103 mg/dL — ABNORMAL HIGH (ref 70–99)
Potassium: 4.5 mmol/L (ref 3.5–5.1)
Sodium: 133 mmol/L — ABNORMAL LOW (ref 135–145)

## 2021-02-11 MED ORDER — IOHEXOL 350 MG/ML SOLN
75.0000 mL | Freq: Once | INTRAVENOUS | Status: AC | PRN
  Administered 2021-02-11: 75 mL via INTRAVENOUS

## 2021-02-11 NOTE — ED Triage Notes (Signed)
Emergency Medicine Provider Triage Evaluation Note  Frank Berg , a 64 y.o. male  was evaluated in triage.  Pt complains of complaint of feeling like he was going to pass out today while taking a walk, also with left posterior neck pain. Patient states he had walked about 3/4 of a mild away from his house, felt light headed and afraid he was going to pass out, turned around and made his way back home, had 4 additional events on the way home. Has had pain in the neck for a few days, no injuries. No arm or leg weakness. No changes in speech. Reports difficulty walking. 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  Medical Decision Making  Medically screening exam initiated at 8:48 PM.  Appropriate orders placed.  Frank Berg was informed that the remainder of the evaluation will be completed by another provider, this initial triage assessment does not replace that evaluation, and the importance of remaining in the ED until their evaluation is complete.  Clinical Impression     Frank Learn, PA-C 02/11/21 2050

## 2021-02-11 NOTE — ED Triage Notes (Signed)
Pt states he has had increasing shortness of breath and went for a walk today and felt like he was going to pass out x 4 times. Pt c/o pain in neck. Pt denies chest pain.

## 2021-02-12 ENCOUNTER — Observation Stay (HOSPITAL_BASED_OUTPATIENT_CLINIC_OR_DEPARTMENT_OTHER): Payer: Medicare Other

## 2021-02-12 ENCOUNTER — Other Ambulatory Visit: Payer: Self-pay | Admitting: Physician Assistant

## 2021-02-12 ENCOUNTER — Other Ambulatory Visit: Payer: Self-pay

## 2021-02-12 ENCOUNTER — Encounter (HOSPITAL_COMMUNITY): Payer: Self-pay | Admitting: Family Medicine

## 2021-02-12 ENCOUNTER — Observation Stay (HOSPITAL_COMMUNITY): Payer: Medicare Other

## 2021-02-12 DIAGNOSIS — R778 Other specified abnormalities of plasma proteins: Secondary | ICD-10-CM | POA: Diagnosis not present

## 2021-02-12 DIAGNOSIS — I25119 Atherosclerotic heart disease of native coronary artery with unspecified angina pectoris: Secondary | ICD-10-CM

## 2021-02-12 DIAGNOSIS — I5032 Chronic diastolic (congestive) heart failure: Secondary | ICD-10-CM

## 2021-02-12 DIAGNOSIS — I480 Paroxysmal atrial fibrillation: Secondary | ICD-10-CM

## 2021-02-12 DIAGNOSIS — R55 Syncope and collapse: Secondary | ICD-10-CM

## 2021-02-12 DIAGNOSIS — R0609 Other forms of dyspnea: Secondary | ICD-10-CM | POA: Diagnosis not present

## 2021-02-12 DIAGNOSIS — R0600 Dyspnea, unspecified: Secondary | ICD-10-CM

## 2021-02-12 DIAGNOSIS — Z9989 Dependence on other enabling machines and devices: Secondary | ICD-10-CM

## 2021-02-12 DIAGNOSIS — R609 Edema, unspecified: Secondary | ICD-10-CM | POA: Diagnosis not present

## 2021-02-12 DIAGNOSIS — N182 Chronic kidney disease, stage 2 (mild): Secondary | ICD-10-CM

## 2021-02-12 DIAGNOSIS — G4733 Obstructive sleep apnea (adult) (pediatric): Secondary | ICD-10-CM

## 2021-02-12 LAB — BASIC METABOLIC PANEL
Anion gap: 8 (ref 5–15)
BUN: 23 mg/dL (ref 8–23)
CO2: 22 mmol/L (ref 22–32)
Calcium: 9 mg/dL (ref 8.9–10.3)
Chloride: 105 mmol/L (ref 98–111)
Creatinine, Ser: 1.17 mg/dL (ref 0.61–1.24)
GFR, Estimated: >60 mL/min (ref >60)
Glucose, Bld: 114 mg/dL — ABNORMAL HIGH (ref 70–99)
Potassium: 3.9 mmol/L (ref 3.5–5.1)
Sodium: 135 mmol/L (ref 135–145)

## 2021-02-12 LAB — BRAIN NATRIURETIC PEPTIDE: B Natriuretic Peptide: 199.6 pg/mL — ABNORMAL HIGH (ref 0.0–100.0)

## 2021-02-12 LAB — CBC
HCT: 44.3 % (ref 39.0–52.0)
Hemoglobin: 14.5 g/dL (ref 13.0–17.0)
MCH: 30.3 pg (ref 26.0–34.0)
MCHC: 32.7 g/dL (ref 30.0–36.0)
MCV: 92.5 fL (ref 80.0–100.0)
Platelets: 224 10*3/uL (ref 150–400)
RBC: 4.79 MIL/uL (ref 4.22–5.81)
RDW: 14.9 % (ref 11.5–15.5)
WBC: 5.6 10*3/uL (ref 4.0–10.5)
nRBC: 0 % (ref 0.0–0.2)

## 2021-02-12 LAB — ECHOCARDIOGRAM COMPLETE
AR max vel: 1.19 cm2
AV Area VTI: 1.15 cm2
AV Area mean vel: 1.11 cm2
AV Mean grad: 10 mmHg
AV Peak grad: 15.7 mmHg
Ao pk vel: 1.98 m/s
Area-P 1/2: 3.08 cm2
Height: 74 in
MV M vel: 3.38 m/s
MV Peak grad: 45.7 mmHg
S' Lateral: 3.7 cm
Weight: 3340.41 oz

## 2021-02-12 LAB — GLUCOSE, CAPILLARY
Glucose-Capillary: 127 mg/dL — ABNORMAL HIGH (ref 70–99)
Glucose-Capillary: 79 mg/dL (ref 70–99)
Glucose-Capillary: 90 mg/dL (ref 70–99)
Glucose-Capillary: 159 mg/dL — ABNORMAL HIGH (ref 70–99)

## 2021-02-12 LAB — MAGNESIUM: Magnesium: 1.7 mg/dL (ref 1.7–2.4)

## 2021-02-12 LAB — HEMOGLOBIN A1C
Hgb A1c MFr Bld: 6.9 % — ABNORMAL HIGH (ref 4.8–5.6)
Mean Plasma Glucose: 151 mg/dL

## 2021-02-12 LAB — TROPONIN I (HIGH SENSITIVITY)
Troponin I (High Sensitivity): 110 ng/L (ref <18)
Troponin I (High Sensitivity): 127 ng/L (ref <18)
Troponin I (High Sensitivity): 121 ng/L (ref <18)

## 2021-02-12 LAB — HIV ANTIBODY (ROUTINE TESTING W REFLEX): HIV Screen 4th Generation wRfx: NONREACTIVE

## 2021-02-12 LAB — MRSA PCR SCREENING: MRSA by PCR: NEGATIVE

## 2021-02-12 LAB — SARS CORONAVIRUS 2 (TAT 6-24 HRS): SARS Coronavirus 2: NEGATIVE

## 2021-02-12 MED ORDER — ONDANSETRON HCL 4 MG/2ML IJ SOLN: 4.0000 mg | Freq: Four times a day (QID) | INTRAMUSCULAR | Status: DC | PRN

## 2021-02-12 MED ORDER — FUROSEMIDE 20 MG PO TABS
20.0000 mg | ORAL_TABLET | Freq: Every day | ORAL | Status: DC
  Administered 2021-02-12 – 2021-02-13 (×2): 20 mg via ORAL
  Filled 2021-02-12 (×2): qty 1

## 2021-02-12 MED ORDER — RIVAROXABAN 20 MG PO TABS
20.0000 mg | ORAL_TABLET | Freq: Every evening | ORAL | Status: DC
  Administered 2021-02-12: 20 mg via ORAL
  Filled 2021-02-12: qty 1

## 2021-02-12 MED ORDER — SENNOSIDES-DOCUSATE SODIUM 8.6-50 MG PO TABS: 1.0000 | ORAL_TABLET | Freq: Every evening | ORAL | Status: DC | PRN

## 2021-02-12 MED ORDER — MECLIZINE HCL 25 MG PO TABS
25.0000 mg | ORAL_TABLET | Freq: Once | ORAL | Status: AC
  Administered 2021-02-12: 25 mg via ORAL
  Filled 2021-02-12: qty 1

## 2021-02-12 MED ORDER — ACETAMINOPHEN 650 MG RE SUPP: 650.0000 mg | Freq: Four times a day (QID) | RECTAL | Status: DC | PRN

## 2021-02-12 MED ORDER — OXYCODONE HCL 5 MG PO TABS: 5.0000 mg | ORAL_TABLET | Freq: Four times a day (QID) | ORAL | Status: DC | PRN

## 2021-02-12 MED ORDER — ROSUVASTATIN CALCIUM 5 MG PO TABS
5.0000 mg | ORAL_TABLET | Freq: Every evening | ORAL | Status: DC
  Administered 2021-02-12: 5 mg via ORAL
  Filled 2021-02-12: qty 1

## 2021-02-12 MED ORDER — MAGNESIUM SULFATE 2 GM/50ML IV SOLN
2.0000 g | Freq: Once | INTRAVENOUS | Status: AC
  Administered 2021-02-12: 2 g via INTRAVENOUS
  Filled 2021-02-12: qty 50

## 2021-02-12 MED ORDER — SODIUM CHLORIDE 0.9% FLUSH
3.0000 mL | Freq: Two times a day (BID) | INTRAVENOUS | Status: DC
  Administered 2021-02-12 – 2021-02-13 (×3): 3 mL via INTRAVENOUS

## 2021-02-12 MED ORDER — ONDANSETRON HCL 4 MG PO TABS
4.0000 mg | ORAL_TABLET | Freq: Four times a day (QID) | ORAL | Status: DC | PRN
Start: 2021-02-12 — End: 2021-02-13

## 2021-02-12 MED ORDER — IRBESARTAN 150 MG PO TABS
150.0000 mg | ORAL_TABLET | Freq: Every day | ORAL | Status: DC
  Administered 2021-02-12: 150 mg via ORAL
  Filled 2021-02-12 (×3): qty 1

## 2021-02-12 MED ORDER — INSULIN ASPART 100 UNIT/ML ~~LOC~~ SOLN
0.0000 [IU] | Freq: Three times a day (TID) | SUBCUTANEOUS | Status: DC
Start: 2021-02-12 — End: 2021-02-13

## 2021-02-12 MED ORDER — ACETAMINOPHEN 325 MG PO TABS: 650.0000 mg | ORAL_TABLET | Freq: Four times a day (QID) | ORAL | Status: DC | PRN

## 2021-02-12 MED ORDER — ASPIRIN 81 MG PO CHEW
324.0000 mg | CHEWABLE_TABLET | Freq: Once | ORAL | Status: AC
  Administered 2021-02-12: 324 mg via ORAL
  Filled 2021-02-12: qty 4

## 2021-02-12 NOTE — Evaluation (Signed)
Physical Therapy Evaluation/ Discharge Patient Details Name: Frank Berg MRN: [id number] DOB: [birth year] Today's Date: 02/12/2021   History of Present Illness  65 yo admitted 4/21 after going for a walk and feeling like he would pass out. PMhx:Bell's palsy, Afib, CAD, DM, CHF, OSA on CPAP  Clinical Impression  Pt very pleasant and reports being active at baseline and living alone. Pt reported he walked .75 miles when symptoms started and that he had several episodes of near syncope PTA but unable to duplicate symptoms during session. HR 78-102 with gait. Pt with ability to sit and look over left and right shoulder with and without shoulder flexion and abduction with reproduction of symptoms. Pt noted mild spinning to left with rolling x 1 during admission with negative Dix Hallpike bil. Pt able to perform all functional mobility and long hall ambulation without assist or presyncope. Pt at baseline without further need for acute therapy with pt aware and agreeable, will sign off.   Post gait 95/69 (79)    Follow Up Recommendations No PT follow up    Equipment Recommendations  None recommended by PT    Recommendations for Other Services       Precautions / Restrictions Precautions Precautions: None      Mobility  Bed Mobility Overal bed mobility: Independent                  Transfers Overall transfer level: Independent                  Ambulation/Gait Ambulation/Gait assistance: Independent Gait Distance (Feet): 1200 Feet Assistive device: None Gait Pattern/deviations: WFL(Within Functional Limits)   Gait velocity interpretation: >4.37 ft/sec, indicative of normal walking speed    Stairs Stairs: Yes Stairs assistance: Modified independent (Device/Increase time) Stair Management: Alternating pattern;Forwards;One rail Right Number of Stairs: 11    Wheelchair Mobility    Modified Rankin (Stroke Patients Only)       Balance Overall balance  assessment: No apparent balance deficits (not formally assessed)                                           Pertinent Vitals/Pain Pain Assessment: No/denies pain    Home Living Family/patient expects to be discharged to:: Private residence Living Arrangements: Alone   Type of Home: House Home Access: Level entry     Home Layout: Two level;Bed/bath upstairs Home Equipment: Cane - single point      Prior Function Level of Independence: Independent         Comments: playing pickle ball, tai chi, line dancing     Hand Dominance        Extremity/Trunk Assessment   Upper Extremity Assessment Upper Extremity Assessment: Overall WFL for tasks assessed    Lower Extremity Assessment Lower Extremity Assessment: RLE deficits/detail RLE Deficits / Details: decreased ROM with pt preference for knee extension to don shoe    Cervical / Trunk Assessment Cervical / Trunk Assessment: Other exceptions Cervical / Trunk Exceptions: limited cervical ROM due to fusion missing grossly 15 degrees of rotation bil  Communication   Communication: No difficulties  Cognition Arousal/Alertness: Awake/alert Behavior During Therapy: WFL for tasks assessed/performed Overall Cognitive Status: Within Functional Limits for tasks assessed  General Comments      Exercises     Assessment/Plan    PT Assessment Patent does not need any further PT services  PT Problem List         PT Treatment Interventions      PT Goals (Current goals can be found in the Care Plan section)  Acute Rehab PT Goals PT Goal Formulation: All assessment and education complete, DC therapy    Frequency     Barriers to discharge        Co-evaluation               AM-PAC PT "6 Clicks" Mobility  Outcome Measure Help needed turning from your back to your side while in a flat bed without using bedrails?: None Help needed moving  from lying on your back to sitting on the side of a flat bed without using bedrails?: None Help needed moving to and from a bed to a chair (including a wheelchair)?: None Help needed standing up from a chair using your arms (e.g., wheelchair or bedside chair)?: None Help needed to walk in hospital room?: None Help needed climbing 3-5 steps with a railing? : None 6 Click Score: 24    End of Session   Activity Tolerance: Patient tolerated treatment well Patient left: in chair;with call bell/phone within reach Nurse Communication: Mobility status PT Visit Diagnosis: Other abnormalities of gait and mobility (R26.89)    Time: 7182-0990 PT Time Calculation (min) (ACUTE ONLY): 28 min   Charges:   PT Evaluation $PT Eval Moderate Complexity: 1 Mod          Chett Taniguchi P, PT Acute Rehabilitation Services Pager: [phone] Office: [phone]   Sandy Salaam Lucien Budney 02/12/2021, 2:38 PM

## 2021-02-12 NOTE — Discharge Instructions (Signed)

## 2021-02-12 NOTE — ED Provider Notes (Signed)
[facility] EMERGENCY DEPARTMENT Provider Note   CSN: [id number] Arrival date & time: 02/11/21  2020     History Chief Complaint  Patient presents with  . Near Syncope    Frank Berg is a 65 y.o. male.  The history is provided by the patient.  Near Syncope  He has history of hypertension, diabetes, hyperlipidemia, atrial fibrillation anticoagulated on rivaroxaban, coronary artery disease, systolic heart failure and comes in after having had several episodes of nearly passing out.  He had walked about three quarters of a mile and when he turned to come home, he noted that he felt like he was going to pass out.  He also noted some spinning sensation and some mild nausea.  He denies chest discomfort or palpitations.  He had several other episodes while he was walking back home.  Episodes only lasted a few seconds before resolving.  He has been having a pain in the left side of his neck for the last several days.  He has also been having difficulty with dyspnea over the last several months.  He has never had near syncopal episodes like this before.  Past Medical History:  Diagnosis Date  . Atrial fibrillation ([place])   . Coronary artery disease   . Diabetes mellitus without complication ([place])   . Diabetic polyneuropathy ([place])   . GERD (gastroesophageal reflux disease)   . Gout, unspecified   . HLD (hyperlipidemia)   . Hx of adenomatous polyp of colon 12/03/2020  . Hypertension   . OSA on CPAP   . Sleep apnea     Patient Active Problem List   Diagnosis Date Noted  . Hx of adenomatous polyp of colon 12/03/2020  . Coronary artery disease due to lipid rich plaque 12/27/2018  . Atrial fibrillation ([place]) 12/27/2018  . Right-sided Bell's palsy 07/30/2018  . CAD (coronary artery disease) 06/20/2017  . Hyperlipidemia 06/20/2017  . PAF (paroxysmal atrial fibrillation) ([place]) 06/20/2017  . Acute systolic CHF (congestive heart failure) ([employer]) 10/10/2016  . Elevated  troponin 10/09/2016  . Benign essential hypertension 09/05/2016  . Healthcare maintenance 09/05/2016  . Need for immunization against influenza 09/05/2016  . Pure hypercholesterolemia 09/05/2016  . Typical atrial flutter ([place]) 07/19/2016  . S/P cryoablation of arrhythmia 06/02/2016  . OSA on CPAP 06/02/2016  . Type 2 diabetes mellitus ([place]) 06/02/2016  . Bilateral edema of lower extremity 05/02/2016  . Presence of prosthetic heart valve 05/02/2016  . DOE (dyspnea on exertion) 05/02/2016    Past Surgical History:  Procedure Laterality Date  . ABLATION    . AORTIC VALVE REPLACEMENT  2011  . CERVICAL DISC SURGERY     spinal stenosis  . COLONOSCOPY    . ESOPHAGOGASTRODUODENOSCOPY    . TONSILLECTOMY         Family History  Problem Relation Age of Onset  . Congenital heart disease Mother   . Diabetes Mother   . Hypertension Mother   . Heart disease Mother   . Heart attack Father   . Cervical cancer Sister   . Diabetes Sister   . Hypertension Brother   . Diabetes Brother   . Pancreatic cancer Brother   . Diabetes Brother   . Breast cancer Sister   . Diabetes Sister   . Diabetes Sister   . Diabetes Sister   . Diabetes Sister   . Diabetes Son   . Colon cancer Neg Hx   . Colon polyps Neg Hx   . Esophageal cancer Neg Hx   .  Stomach cancer Neg Hx     Social History   Tobacco Use  . Smoking status: Former Smoker    Quit date: 11/03/1981    Years since quitting: 39.3  . Smokeless tobacco: Never Used  Vaping Use  . Vaping Use: Never used  Substance Use Topics  . Alcohol use: Yes    Comment: occasional  . Drug use: No    Home Medications Prior to Admission medications   Medication Sig Start Date End Date Taking? Authorizing Provider  allopurinol (ZYLOPRIM) 300 MG tablet Take 450 mg by mouth daily. 11/23/19   [provider]  clindamycin (CLEOCIN T) 1 % external solution  11/29/19   [provider]  colchicine 0.6 MG tablet Take 0.6 mg by mouth 2  (two) times daily. 11/17/19   [provider]  furosemide (LASIX) 20 MG tablet Take 20 mg by mouth daily. 10/10/16   [provider]  magnesium oxide (MAG-OX) 400 MG tablet Take 1 tablet by mouth daily. 12/11/19   [provider]  metFORMIN (GLUCOPHAGE) 500 MG tablet Take 500 mg by mouth 2 (two) times daily with a meal.    [provider]  Omega-3 Fatty Acids (FISH OIL) 1200 MG CPDR Take by mouth daily.    [provider]  potassium chloride (MICRO-K) 10 MEQ CR capsule Take 10 mEq by mouth 2 (two) times daily. 10/13/16   [provider]  rosuvastatin (CRESTOR) 5 MG tablet Take 5 mg by mouth every evening. 08/26/16   [provider]  tadalafil (CIALIS) 20 MG tablet Take 20 mg by mouth daily as needed. 11/19/19   [provider]  telmisartan (MICARDIS) 40 MG tablet Take 40 mg by mouth daily. 12/16/17   [provider]  VITAMIN D PO Take by mouth.    [provider]  XARELTO 20 MG TABS tablet Take 1 tablet (20 mg total) by mouth every evening. 09/07/20   Jettie Booze, MD    Allergies    Lipitor [atorvastatin] and Penicillins  Review of Systems   Review of Systems  Cardiovascular: Positive for near-syncope.  All other systems reviewed and are negative.   Physical Exam Updated Vital Signs BP 110/80   Pulse 72   Temp (!) 97.5 F (36.4 C) (Oral)   Resp 19   SpO2 100%   Physical Exam Vitals and nursing note reviewed.   65 year old male, resting comfortably and in no acute distress. Vital signs are normal. Oxygen saturation is 100%, which is normal. Head is normocephalic and atraumatic. PERRLA, EOMI. Oropharynx is clear. Neck is nontender and supple without adenopathy or JVD.  There are no carotid bruits. Back is nontender and there is no CVA tenderness. Lungs are clear without rales, wheezes, or rhonchi. Chest is nontender. Heart has regular rate and rhythm without murmur. Abdomen is soft,  flat, nontender without masses or hepatosplenomegaly and peristalsis is normoactive. Extremities have no cyanosis or edema, full range of motion is present. Skin is warm and dry without rash. Neurologic: Mental status is normal, cranial nerves are intact, there are no motor or sensory deficits.  Finger-nose testing is normal.  Romberg is normal.  There is no nystagmus.  Dizziness is not reproduced by passive head movement.  ED Results / Procedures / Treatments   Labs (all labs ordered are listed, but only abnormal results are displayed) Labs Reviewed  BASIC METABOLIC PANEL - Abnormal; Notable for the following components:      Result Value  Sodium 133 (*)    Glucose, Bld 103 (*)    BUN 26 (*)    Creatinine, Ser 1.30 (*)    All other components within normal limits  BRAIN NATRIURETIC PEPTIDE - Abnormal; Notable for the following components:   B Natriuretic Peptide 199.6 (*)    All other components within normal limits  TROPONIN I (HIGH SENSITIVITY) - Abnormal; Notable for the following components:   Troponin I (High Sensitivity) 110 (*)    All other components within normal limits  SARS CORONAVIRUS 2 (TAT 6-24 HRS)  CBC WITH DIFFERENTIAL/PLATELET  TROPONIN I (HIGH SENSITIVITY)    EKG EKG Interpretation  Date/Time:  Friday February 12 2021 01:10:02 EDT Ventricular Rate:  60 PR Interval:  189 QRS Duration: 162 QT Interval:  444 QTC Calculation: 444 R Axis:   64 Text Interpretation: Sinus rhythm Multiple ventricular premature complexes Right bundle branch block When compared with ECG of 11/04/2016, Premature ventricular complexes are now present Confirmed by Delora Fuel ([id number]) on 02/12/2021 1:17:26 AM   Radiology CT Angio Head W/Cm &/Or Wo Cm  Result Date: 02/11/2021 CLINICAL DATA:  Acute neurologic deficit EXAM: CT ANGIOGRAPHY HEAD AND NECK TECHNIQUE: Multidetector CT imaging of the head and neck was performed using the standard protocol during bolus administration of  intravenous contrast. Multiplanar CT image reconstructions and MIPs were obtained to evaluate the vascular anatomy. Carotid stenosis measurements (when applicable) are obtained utilizing NASCET criteria, using the distal internal carotid diameter as the denominator. CONTRAST:  43mL OMNIPAQUE IOHEXOL 350 MG/ML SOLN COMPARISON:  None. FINDINGS: CT HEAD FINDINGS Brain: There is no mass, hemorrhage or extra-axial collection. The size and configuration of the ventricles and extra-axial CSF spaces are normal. There is no acute or chronic infarction. The brain parenchyma is normal. Skull: The visualized skull base, calvarium and extracranial soft tissues are normal. Sinuses/Orbits: No fluid levels or advanced mucosal thickening of the visualized paranasal sinuses. No mastoid or middle ear effusion. The orbits are normal. CTA NECK FINDINGS SKELETON: C3-7 posterior decompression OTHER NECK: Normal pharynx, larynx and major salivary glands. No cervical lymphadenopathy. Unremarkable thyroid gland. UPPER CHEST: No pneumothorax or pleural effusion. No nodules or masses. AORTIC ARCH: There is no calcific atherosclerosis of the aortic arch. There is no aneurysm, dissection or hemodynamically significant stenosis of the visualized portion of the aorta. Conventional 3 vessel aortic branching pattern. The visualized proximal subclavian arteries are widely patent. RIGHT CAROTID SYSTEM: Normal without aneurysm, dissection or stenosis. LEFT CAROTID SYSTEM: Normal without aneurysm, dissection or stenosis. 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 CTA HEAD FINDINGS POSTERIOR CIRCULATION: --Vertebral arteries: Normal right V4 segment --Inferior cerebellar arteries: Normal. --Basilar artery: Normal. --Superior cerebellar arteries: Normal. --Posterior cerebral arteries (PCA): Normal. Both are predominantly supplied by the  posterior communicating arteries (p-comm). ANTERIOR CIRCULATION: --Intracranial internal carotid arteries: Normal. --Anterior cerebral arteries (ACA): Normal. Both A1 segments are present. Patent anterior communicating artery (a-comm). --Middle cerebral arteries (MCA): Normal. VENOUS SINUSES: As permitted by contrast timing, patent. ANATOMIC VARIANTS: Fetal origins of both posterior cerebral arteries. Review of the MIP images confirms the above findings. IMPRESSION: 1. No intracranial arterial occlusion or high-grade stenosis. 2. 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 Aortic Atherosclerosis (ICD10-I70.0). Electronically Signed   By: Ulyses Jarred M.D.   On: 02/11/2021 21:58   CT Angio Neck W and/or Wo  Contrast  Result Date: 02/11/2021 CLINICAL DATA:  Acute neurologic deficit EXAM: CT ANGIOGRAPHY HEAD AND NECK TECHNIQUE: Multidetector CT imaging of the head and neck was performed using the standard protocol during bolus administration of intravenous contrast. Multiplanar CT image reconstructions and MIPs were obtained to evaluate the vascular anatomy. Carotid stenosis measurements (when applicable) are obtained utilizing NASCET criteria, using the distal internal carotid diameter as the denominator. CONTRAST:  44mL OMNIPAQUE IOHEXOL 350 MG/ML SOLN COMPARISON:  None. FINDINGS: CT HEAD FINDINGS Brain: There is no mass, hemorrhage or extra-axial collection. The size and configuration of the ventricles and extra-axial CSF spaces are normal. There is no acute or chronic infarction. The brain parenchyma is normal. Skull: The visualized skull base, calvarium and extracranial soft tissues are normal. Sinuses/Orbits: No fluid levels or advanced mucosal thickening of the visualized paranasal sinuses. No mastoid or middle ear effusion. The orbits are normal. CTA NECK FINDINGS SKELETON: C3-7 posterior decompression OTHER NECK:  Normal pharynx, larynx and major salivary glands. No cervical lymphadenopathy. Unremarkable thyroid gland. UPPER CHEST: No pneumothorax or pleural effusion. No nodules or masses. AORTIC ARCH: There is no calcific atherosclerosis of the aortic arch. There is no aneurysm, dissection or hemodynamically significant stenosis of the visualized portion of the aorta. Conventional 3 vessel aortic branching pattern. The visualized proximal subclavian arteries are widely patent. RIGHT CAROTID SYSTEM: Normal without aneurysm, dissection or stenosis. LEFT CAROTID SYSTEM: Normal without aneurysm, dissection or stenosis. 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 CTA HEAD FINDINGS POSTERIOR CIRCULATION: --Vertebral arteries: Normal right V4 segment --Inferior cerebellar arteries: Normal. --Basilar artery: Normal. --Superior cerebellar arteries: Normal. --Posterior cerebral arteries (PCA): Normal. Both are predominantly supplied by the posterior communicating arteries (p-comm). ANTERIOR CIRCULATION: --Intracranial internal carotid arteries: Normal. --Anterior cerebral arteries (ACA): Normal. Both A1 segments are present. Patent anterior communicating artery (a-comm). --Middle cerebral arteries (MCA): Normal. VENOUS SINUSES: As permitted by contrast timing, patent. ANATOMIC VARIANTS: Fetal origins of both posterior cerebral arteries. Review of the MIP images confirms the above findings. IMPRESSION: 1. No intracranial arterial occlusion or high-grade stenosis. 2. 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 Aortic Atherosclerosis (ICD10-I70.0). Electronically Signed   By: Ulyses Jarred M.D.   On: 02/11/2021 21:58    Procedures Procedures   Medications Ordered in ED Medications  iohexol (OMNIPAQUE) 350 MG/ML injection 75 mL  (75 mLs Intravenous Contrast Given 02/11/21 2133)  meclizine (ANTIVERT) tablet 25 mg (25 mg Oral Given 02/12/21 0045)  aspirin chewable tablet 324 mg (324 mg Oral Given 02/12/21 0322)    ED Course  I have reviewed the triage vital signs and the nursing notes.  Pertinent labs & imaging results that were available during my care of the patient were reviewed by me and considered in my medical decision making (see chart for details).  MDM Rules/Calculators/A&P Transient episodes of near syncope of uncertain cause.  He had been sent for CT angiogram of head and neck which showed congenital absence of left vertebral artery, aortic atherosclerosis but no significant arterial occlusions.  Labs show stable renal insufficiency, mild hyponatremia which is not felt to be clinically significant.  proBNP had been somewhat elevated yesterday, will check troponin and BNP and ECG today.  Orthostatic vital signs showed no significant change in pulse or blood pressure.  ECG has PVCs, right bundle branch block which was pre-existing, no  acute ST or T changes.  BNP is mildly elevated at just under 200.  Troponin has come back elevated at 110.  He will need a second troponin, but will clearly need to be admitted with near syncope in the setting of elevated troponin.  He is given a dose of oral aspirin.  Case is discussed with Dr. Myna Hidalgo of [facility], who agrees to admit the patient.  Final Clinical Impression(s) / ED Diagnoses Final diagnoses:  Near syncope  Elevated troponin  Renal insufficiency    Rx / DC Orders ED Discharge Orders    None       Delora Fuel, MD 65/78/46 [phone]

## 2021-02-12 NOTE — Progress Notes (Signed)
Lower extremity venous has been completed.   Preliminary results in CV Proc.   [name] 02/12/2021 8:54 AM

## 2021-02-12 NOTE — Plan of Care (Signed)

## 2021-02-12 NOTE — Consult Note (Addendum)
Cardiology Consultation:   Patient ID: Frank Berg MRN: [id number]; DOB: [birth year]  Admit date: 02/11/2021 Date of Consult: 02/12/2021  PCP:  [name], [place]  Cardiologist:  Larae Grooms, MD    Patient Profile:   Frank Berg is a 65 y.o. male with a hx of AVR and CABG x 1 (2011), Afib s/p ablation on xarelto, DM with polyneuropathy, HTN, HLD, and OSA on CPAP who is being seen today for the evaluation of syncope and positive troponin at the request of Dr. Tawanna Solo.  History of Present Illness:   Frank Berg had PCI in 2008 and had AVR with bovine valve in 2011 at age 23 along with CABG x 1, presumably bicuspid valve as he had a heart murmur starting at age 18. SBE PPX with doxycyline. He underwent Afib ablation in Dec 2017. He is anticoagulated with xarelto. He established care with Dr. Irish Lack after moving to [place] from [place].  Amiodarone was weaned and maintained sinus rhythm. He questions staying on [place]. He saw Dr. Rayann Heman in 2019 who recommended staying on anticoagulation. They discussed ILR placement and if no Afib detected, then [place] could be stopped. He decided against ILR placement. He was last seen by Dr. Irish Lack 09/07/20 and reported SOB. He also plays pickle ball regularly. Last echo in 2018 with normal valve function. Stress test was planned for symptoms and was negative for ischemia on 09/28/20. Echo had not been obtained. Pt also reported blood in stool and was referred to GI for colonoscopy.  Colonoscopy was difficult but negative for bleeding on 11/2020.  He complained of hemoptysis and saw ENT. CT chest negative for PE.   He is maintained on 20 mg lasix, crestor 5 mg, potassium supplement, and 40 mg telmisartan.   He presented to [facility] with near syncope. He has been working out three times weekly and playing pickleball without issues. 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 He went for a walk yesterday and became acutely dizzy and felt like he would pass out. He did not fall or lose consciousness. He also reports shortness of breath at this time. He came to the ER for further evaluation. On arrival, troponins were low and flat, inconsistent with an ACS process. 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 He is not currently on a BB and no longer on amiodarone. He also reports left neck pain over the past several weeks. He has mild lower extremity edema, but has also been eating more restaurant food over the last week.    Past Medical History:  Diagnosis Date  . Atrial fibrillation ([place])   . Coronary artery disease   . Diabetes mellitus without complication ([place])   . Diabetic polyneuropathy ([place])   . GERD (gastroesophageal reflux disease)   . Gout, unspecified   . HLD (hyperlipidemia)   . Hx of adenomatous polyp of colon 12/03/2020  . Hypertension   . OSA on CPAP   . Sleep apnea     Past Surgical History:  Procedure Laterality Date  . ABLATION    . AORTIC VALVE REPLACEMENT  2011  . CERVICAL DISC SURGERY     spinal stenosis  . COLONOSCOPY    . ESOPHAGOGASTRODUODENOSCOPY    . TONSILLECTOMY       Home Medications:  Prior to Admission medications   Medication Sig Start Date End Date Taking?  Authorizing Provider  allopurinol (ZYLOPRIM) 300 MG tablet Take 300 mg by mouth daily. 11/23/19  Yes [provider]  clindamycin (CLEOCIN T) 1 % external solution  11/29/19  Yes [provider]  colchicine 0.6 MG tablet Take 0.6 mg by mouth 2 (two) times daily as needed (flare-ups). 11/17/19  Yes [provider]  furosemide (LASIX) 20 MG tablet Take 20 mg by mouth daily. 10/10/16  Yes [provider]  magnesium oxide (MAG-OX) 400 MG tablet Take 1 tablet by mouth daily. 12/11/19  Yes [provider]  metFORMIN (GLUCOPHAGE) 500 MG tablet Take 500 mg by mouth 2 (two) times daily with a  meal.   Yes [provider]  Omega-3 Fatty Acids (FISH OIL) 1200 MG CPDR Take by mouth daily.   Yes [provider]  potassium chloride (MICRO-K) 10 MEQ CR capsule Take 10 mEq by mouth 2 (two) times daily. 10/13/16  Yes [provider]  rosuvastatin (CRESTOR) 5 MG tablet Take 5 mg by mouth every evening. 08/26/16  Yes [provider]  tadalafil (CIALIS) 20 MG tablet Take 20 mg by mouth daily as needed. 11/19/19  Yes [provider]  telmisartan (MICARDIS) 40 MG tablet Take 40 mg by mouth daily. 12/16/17  Yes [provider]  VITAMIN D PO Take 1 capsule by mouth daily.   Yes [provider]  XARELTO 20 MG TABS tablet Take 1 tablet (20 mg total) by mouth every evening. 09/07/20  Yes Jettie Booze, MD    Inpatient Medications: Scheduled Meds: . furosemide  20 mg Oral Daily  . insulin aspart  0-9 Units Subcutaneous TID WC  . irbesartan  150 mg Oral Daily  . rivaroxaban  20 mg Oral QPM  . rosuvastatin  5 mg Oral QPM  . sodium chloride flush  3 mL Intravenous Q12H   Continuous Infusions:  PRN Meds: acetaminophen **OR** acetaminophen, ondansetron **OR** ondansetron (ZOFRAN) IV, oxyCODONE, senna-docusate  Allergies:    Allergies  Allergen Reactions  . Lipitor [Atorvastatin] Other (See Comments)    Body aches  . Penicillins Swelling    Swollen face    Social History:   Social History   Socioeconomic History  . Marital status: Married    Spouse name: Not on file  . Number of children: 2  . Years of education: Not on file  . Highest education level: Not on file  Occupational History  . Occupation: retired  Tobacco Use  . Smoking status: Former Smoker    Quit date: 11/03/1981    Years since quitting: 39.3  . Smokeless tobacco: Never Used  Vaping Use  . Vaping Use: Never used  Substance and Sexual Activity  . Alcohol use: Yes    Comment: occasional  . Drug use: No  . Sexual activity: Not on file  Other Topics  Concern  . 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 Congenital heart disease Mother   . Diabetes Mother   . Hypertension Mother   . Heart disease Mother   . Heart attack Father   . Cervical cancer Sister   . Diabetes Sister   . Hypertension Brother   . Diabetes Brother   . Pancreatic cancer Brother   . Diabetes Brother   . Breast cancer Sister   . Diabetes Sister   . Diabetes Sister   . Diabetes Sister   . Diabetes Sister   . Diabetes Son   . Colon cancer Neg Hx   . Colon polyps Neg Hx   . Esophageal cancer Neg Hx   . Stomach cancer Neg Hx      ROS:  Please see the history of present illness.   All other ROS reviewed and negative.     Physical Exam/Data:   Vitals:   02/12/21 0400 02/12/21 0401 02/12/21 0456 02/12/21 0805  BP: 108/76  109/77 103/77  Pulse: 69  61 60  Resp: 15  (!) 21 12  Temp:  97.9 F (36.6 C) 97.9 F (36.6 C) 97.8 F (36.6 C)  TempSrc:  Oral Oral Oral  SpO2: 100%  98% 100%  Weight:   94.7 kg   Height:   '6\' 2"'$  (1.88 m)     Intake/Output Summary (Last 24 hours) at 02/12/2021 1031 Last data filed at 02/12/2021 1014 Gross per 24 hour  Intake 240 ml  Output 725 ml  Net -485 ml   Last 3  Weights 02/12/2021 11/25/2020 10/12/2020  Weight (lbs) 208 lb 12.4 oz 223 lb 223 lb  Weight (kg) 94.7 kg 101.152 kg 101.152 kg     Body mass index is 26.81 kg/m.  General:  Older obese male in NAD Neck: no JVD Vascular: No carotid bruits  Cardiac:  normal S1, S2; RRR; no murmur  Lungs:  clear to auscultation bilaterally, no wheezing, rhonchi or rales  Abd: soft, nontender, no hepatomegaly  Ext: mild B LE edema R > L Musculoskeletal:  No deformities, BUE and BLE strength normal and equal Skin: warm and dry  Neuro:  CNs 2-12 intact, no focal abnormalities noted Psych:  Normal affect   EKG:  The EKG was personally reviewed and demonstrates:  Sinus rhythm HR 60, RBBB, PVCs Telemetry:  Telemetry was personally reviewed and demonstrates:  Sinus rhythm with frequent PVCs, paired PVCs and runs of NSVT but also artifact - HR in the 60-70s  Relevant CV Studies:  Echo pending   Laboratory Data:  High Sensitivity Troponin:   Recent Labs  Lab 02/12/21 0104 02/12/21 0328 02/12/21 0757  TROPONINIHS 110* 127* 121*     Chemistry Recent Labs  Lab 02/09/21 1203 02/11/21 2053 02/12/21 0757  NA 137 133* 135  K 5.1 4.5 3.9  CL 99 101 105  CO2 $Re'22 23 22  'Ieb$ GLUCOSE 80 103* 114*  BUN 17 26* 23  CREATININE 1.45* 1.30* 1.17  CALCIUM 10.0 9.3 9.0  GFRNONAA  --  >60 >60  ANIONGAP  --  9 8    No results for input(s): PROT, ALBUMIN, AST, ALT, ALKPHOS, BILITOT in the last 168 hours. Hematology Recent Labs  Lab 02/11/21 2053 02/12/21 0757  WBC 5.9 5.6  RBC 4.92 4.79  HGB 14.9 14.5  HCT 45.4 44.3  MCV 92.3 92.5  MCH 30.3 30.3  MCHC 32.8 32.7  RDW 14.7 14.9  PLT 246 224   BNP Recent Labs  Lab 02/09/21 1203 02/12/21 0104  BNP  --  199.6*  PROBNP 792*  --     DDimer No results for input(s): DDIMER in the last 168 hours.   Radiology/Studies:  CT Angio Head W/Cm &/Or Wo Cm  Result Date: 02/11/2021 CLINICAL DATA:  Acute neurologic deficit EXAM: CT ANGIOGRAPHY HEAD AND NECK  TECHNIQUE: Multidetector CT imaging of the head and neck was performed using the standard protocol during bolus administration of intravenous contrast. Multiplanar CT image reconstructions and MIPs were obtained to evaluate the vascular anatomy. Carotid stenosis measurements (when applicable) are obtained utilizing NASCET criteria, using the distal internal carotid diameter as the denominator. CONTRAST:  25mL OMNIPAQUE IOHEXOL 350 MG/ML SOLN COMPARISON:  None. FINDINGS: CT HEAD FINDINGS Brain: There is no mass, hemorrhage or extra-axial collection. The size and configuration of the ventricles and extra-axial CSF spaces are normal. There is no acute or chronic infarction. The brain parenchyma is normal. Skull: The visualized skull base, calvarium and extracranial soft tissues are normal. Sinuses/Orbits: No fluid levels or advanced mucosal thickening of the visualized paranasal sinuses. No mastoid or middle ear effusion. The orbits are normal. CTA NECK FINDINGS SKELETON: C3-7 posterior decompression OTHER NECK: Normal pharynx, larynx and major salivary glands. No cervical lymphadenopathy. Unremarkable thyroid gland. UPPER CHEST: No pneumothorax or pleural effusion. No nodules or masses. AORTIC ARCH: There is no calcific atherosclerosis of the aortic arch. There is no aneurysm, dissection or hemodynamically significant stenosis of the visualized portion of the aorta. Conventional 3 vessel aortic branching pattern. The visualized proximal subclavian arteries are widely patent. RIGHT CAROTID SYSTEM: Normal without aneurysm, dissection or stenosis. LEFT CAROTID SYSTEM: Normal without aneurysm, dissection or stenosis. 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 CTA HEAD FINDINGS POSTERIOR CIRCULATION: --Vertebral arteries: Normal right V4 segment --Inferior cerebellar arteries: Normal. --Basilar artery: Normal.  --Superior cerebellar arteries: Normal. --Posterior cerebral arteries (PCA): Normal. Both are predominantly supplied by the posterior communicating arteries (p-comm). ANTERIOR CIRCULATION: --Intracranial internal carotid arteries: Normal. --Anterior cerebral arteries (ACA): Normal. Both A1 segments are present. Patent anterior communicating artery (a-comm). --Middle cerebral arteries (MCA): Normal. VENOUS SINUSES: As permitted by contrast timing, patent. ANATOMIC VARIANTS: Fetal origins of both posterior cerebral arteries. Review of the MIP images confirms the above findings. IMPRESSION: 1. No intracranial arterial occlusion or high-grade stenosis. 2. 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 Aortic Atherosclerosis (ICD10-I70.0). Electronically Signed   By: Ulyses Jarred M.D.   On: 02/11/2021 21:58   CT Angio Neck W and/or Wo Contrast  Result Date: 02/11/2021 CLINICAL DATA:  Acute neurologic deficit EXAM: CT ANGIOGRAPHY HEAD AND NECK TECHNIQUE: Multidetector CT imaging of the head and neck was performed using the standard protocol during bolus administration of intravenous contrast. Multiplanar CT image reconstructions and MIPs were obtained to evaluate the vascular anatomy. Carotid stenosis measurements (when applicable) are obtained utilizing NASCET criteria, using the distal internal carotid diameter as the denominator. CONTRAST:  14mL OMNIPAQUE IOHEXOL 350 MG/ML SOLN COMPARISON:  None. FINDINGS: CT HEAD FINDINGS Brain: There is no mass, hemorrhage or extra-axial collection. The size and configuration of the ventricles and extra-axial CSF spaces are normal. There is no  acute or chronic infarction. The brain parenchyma is normal. Skull: The visualized skull base, calvarium and extracranial soft tissues are normal. Sinuses/Orbits: No fluid levels or advanced mucosal thickening of the visualized paranasal sinuses. No  mastoid or middle ear effusion. The orbits are normal. CTA NECK FINDINGS SKELETON: C3-7 posterior decompression OTHER NECK: Normal pharynx, larynx and major salivary glands. No cervical lymphadenopathy. Unremarkable thyroid gland. UPPER CHEST: No pneumothorax or pleural effusion. No nodules or masses. AORTIC ARCH: There is no calcific atherosclerosis of the aortic arch. There is no aneurysm, dissection or hemodynamically significant stenosis of the visualized portion of the aorta. Conventional 3 vessel aortic branching pattern. The visualized proximal subclavian arteries are widely patent. RIGHT CAROTID SYSTEM: Normal without aneurysm, dissection or stenosis. LEFT CAROTID SYSTEM: Normal without aneurysm, dissection or stenosis. 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 CTA HEAD FINDINGS POSTERIOR CIRCULATION: --Vertebral arteries: Normal right V4 segment --Inferior cerebellar arteries: Normal. --Basilar artery: Normal. --Superior cerebellar arteries: Normal. --Posterior cerebral arteries (PCA): Normal. Both are predominantly supplied by the posterior communicating arteries (p-comm). ANTERIOR CIRCULATION: --Intracranial internal carotid arteries: Normal. --Anterior cerebral arteries (ACA): Normal. Both A1 segments are present. Patent anterior communicating artery (a-comm). --Middle cerebral arteries (MCA): Normal. VENOUS SINUSES: As permitted by contrast timing, patent. ANATOMIC VARIANTS: Fetal origins of both posterior cerebral arteries. Review of the MIP images confirms the above findings. IMPRESSION: 1. No intracranial arterial occlusion or high-grade stenosis. 2. 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 Aortic Atherosclerosis (ICD10-I70.0). Electronically Signed   By: Ulyses Jarred M.D.   On: 02/11/2021  21:58   VAS [place] LOWER EXTREMITY VENOUS (DVT)  Result Date: 02/12/2021  Lower Venous DVT Study Patient Name:  KISEAN ROLLO  Date of Exam:   02/12/2021 Medical Rec #: [id number]        Accession #:    [id number] Date of Birth: [birth year]         Patient Gender: M Patient Age:   64Y Exam Location:  [facility] Procedure:      VAS [place] LOWER EXTREMITY VENOUS (DVT) Referring Phys: [id number] TIMOTHY S OPYD --------------------------------------------------------------------------------  Indications: Edema.  Comparison Study: no prior Performing Technologist: [name] RVS  Examination Guidelines: A complete evaluation includes B-mode imaging, spectral Doppler, color Doppler, and power Doppler as needed of all accessible portions of each vessel. Bilateral testing is considered an integral part of a complete examination. Limited examinations for reoccurring indications may be performed as noted. The reflux portion of the exam is performed with the patient in reverse Trendelenburg.  +---------+---------------+---------+-----------+----------+--------------+ RIGHT    CompressibilityPhasicitySpontaneityPropertiesThrombus Aging +---------+---------------+---------+-----------+----------+--------------+ CFV      Full           Yes      Yes                                 +---------+---------------+---------+-----------+----------+--------------+ SFJ      Full                                                        +---------+---------------+---------+-----------+----------+--------------+ FV Prox  Full                                                        +---------+---------------+---------+-----------+----------+--------------+  FV Mid   Full                                                        +---------+---------------+---------+-----------+----------+--------------+ FV DistalFull                                                         +---------+---------------+---------+-----------+----------+--------------+ PFV      Full                                                        +---------+---------------+---------+-----------+----------+--------------+ POP      Full           Yes      Yes                                 +---------+---------------+---------+-----------+----------+--------------+ PTV      Full                                                        +---------+---------------+---------+-----------+----------+--------------+ PERO     Full                                                        +---------+---------------+---------+-----------+----------+--------------+   +----+---------------+---------+-----------+----------+--------------+ LEFTCompressibilityPhasicitySpontaneityPropertiesThrombus Aging +----+---------------+---------+-----------+----------+--------------+ CFV Full           Yes      Yes                                 +----+---------------+---------+-----------+----------+--------------+  53   Summary: RIGHT: - There is no evidence of deep vein thrombosis in the lower extremity.  - No cystic structure found in the popliteal fossa.  LEFT: - No evidence of common femoral vein obstruction.  *See table(s) above for measurements and observations.    Preliminary      Assessment and Plan:   Near syncope DOE - EKG with PVCs and known RBBB - BNP 200 - will obtain echocardiogram - he denied chest pain and palpitations during this event yesterday - low suspicion for PE - he is anticoagulated and not tachycardic - low suspicion for an ACS - CE flat, nonischemic EKG - will place a live zio via EKG team prior to discharge   PVCs - telemetry and EKG with frequent PVCs - Mg 1.7 - aggressively replace to > 2.0 - do not suspect his is the etiology of his near syncope - he denies palpitations since admission  - HR in the 60s, may not tolerate a BB - heart monitor will give  an indication of PVC burden after magnesium replacement   Elevated troponin - hs troponin 110 --> 127 --> 121 - EKG nonischemic - he denies chest pain - recent nuclear stress test is reassuring - low suspicion for ACS   Lower extremity edema - continue home dose 20 mg PO lasix - await echocardiogram   PAF/flutter s/p Ablation 09/2016 Chronic anticoagulation - continue xarelto - no recurrence on telemetry   Hypertension - continue home medications   Hyperlipidemia with LDL < 70 - continue 5 mg crestor   AKI - sCr  1.17 (1.30) - K 4.5 (3.9)   If echo normal, can discharge today from a cardiac perspective with a livecor zio placed by EKG prior to 2pm. Will await formal read. If abnormal, may need right and left heart catheterization.    Risk Assessment/Risk Scores:      For questions or updates, please contact [place] Please consult [email or web address] for contact info under    Signed, Ledora Bottcher, PA  02/12/2021 10:31 AM

## 2021-02-12 NOTE — H&P (Signed)
History and Physical    Frank Berg [id number] DOB: 1956/09/18 DOA: 02/11/2021  PCP: [name], MD   Patient coming from: Home    Chief Complaint: Lightheaded   HPI: Frank Berg is a 65 y.o. male with medical history significant for Bell Palsy, atrial fibrillation on Xarelto, coronary artery disease, type 2 diabetes mellitus, chronic diastolic CHF, OSA on CPAP, now presenting to the emergency department for evaluation of near syncope.  Patient reports that he has had roughly 2 months of increased shortness of breath and scant hemoptysis.  He saw ENT for the hemoptysis and reports that etiology is not identified with fiberoptic exam.  He discussed the symptoms with his cardiology clinic, had blood work performed, and was planning to have echo updated.  He has since gone on to develop increased swelling in his lower right leg.  He then went for a walk today and began to feel as though he was about to pass out.  He did not actually lose consciousness and symptoms resolved in a few seconds.  He then experienced the same sensation 3 more times.  He has not noticed lightheadedness upon standing.  He does not have any chest pain.  He had an episode of palpitations recently.  ED Course: Upon arrival to the ED, patient is found to be afebrile, saturating well on room air, and with stable blood pressure.  EKG features sinus rhythm with PVCs and RBBB.  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  Chemistry panel features a sodium 133 and creatinine 1.30.  CBC unremarkable.  High-sensitivity troponin 110 and then 127.  BNP elevated 299.6.  Patient was treated with 324 mg of aspirin and meclizine in the ED.  Review of Systems:  All other systems reviewed and apart from HPI, are negative.  Past Medical History:  Diagnosis Date  . Atrial fibrillation ([place])   . Coronary artery disease   . Diabetes mellitus without complication ([place])   .  Diabetic polyneuropathy ([place])   . GERD (gastroesophageal reflux disease)   . Gout, unspecified   . HLD (hyperlipidemia)   . Hx of adenomatous polyp of colon 12/03/2020  . Hypertension   . OSA on CPAP   . Sleep apnea     Past Surgical History:  Procedure Laterality Date  . ABLATION    . AORTIC VALVE REPLACEMENT  2011  . CERVICAL DISC SURGERY     spinal stenosis  . COLONOSCOPY    . ESOPHAGOGASTRODUODENOSCOPY    . TONSILLECTOMY      Social History:   reports that he quit smoking about 39 years ago. He has never used smokeless tobacco. He reports current alcohol use. He reports that he does not use drugs.  Allergies  Allergen Reactions  . Lipitor [Atorvastatin] Other (See Comments)    Body aches  . Penicillins Swelling    Swollen face    Family History  Problem Relation Age of Onset  . Congenital heart disease Mother   . Diabetes Mother   . Hypertension Mother   . Heart disease Mother   . Heart attack Father   . Cervical cancer Sister   . Diabetes Sister   . Hypertension Brother   . Diabetes Brother   . Pancreatic cancer Brother   . Diabetes Brother   . Breast cancer Sister   . Diabetes Sister   . Diabetes Sister   . Diabetes Sister   . Diabetes Sister   .  Diabetes Son   . Colon cancer Neg Hx   . Colon polyps Neg Hx   . Esophageal cancer Neg Hx   . Stomach cancer Neg Hx      Prior to Admission medications   Medication Sig Start Date End Date Taking? Authorizing Provider  allopurinol (ZYLOPRIM) 300 MG tablet Take 300 mg by mouth daily. 11/23/19  Yes [provider]  clindamycin (CLEOCIN T) 1 % external solution  11/29/19  Yes [provider]  colchicine 0.6 MG tablet Take 0.6 mg by mouth 2 (two) times daily as needed (flare-ups). 11/17/19  Yes [provider]  furosemide (LASIX) 20 MG tablet Take 20 mg by mouth daily. 10/10/16  Yes [provider]  magnesium oxide (MAG-OX) 400 MG tablet Take 1 tablet by mouth daily. 12/11/19   Yes [provider]  metFORMIN (GLUCOPHAGE) 500 MG tablet Take 500 mg by mouth 2 (two) times daily with a meal.   Yes [provider]  Omega-3 Fatty Acids (FISH OIL) 1200 MG CPDR Take by mouth daily.   Yes [provider]  potassium chloride (MICRO-K) 10 MEQ CR capsule Take 10 mEq by mouth 2 (two) times daily. 10/13/16  Yes [provider]  rosuvastatin (CRESTOR) 5 MG tablet Take 5 mg by mouth every evening. 08/26/16  Yes [provider]  tadalafil (CIALIS) 20 MG tablet Take 20 mg by mouth daily as needed. 11/19/19  Yes [provider]  telmisartan (MICARDIS) 40 MG tablet Take 40 mg by mouth daily. 12/16/17  Yes [provider]  VITAMIN D PO Take 1 capsule by mouth daily.   Yes [provider]  XARELTO 20 MG TABS tablet Take 1 tablet (20 mg total) by mouth every evening. 09/07/20  Yes Frank Booze, MD    Physical Exam: Vitals:   02/12/21 0322 02/12/21 0400 02/12/21 0401 02/12/21 0456  BP: 108/80 108/76  109/77  Pulse: 67 69  61  Resp: 19 15  (!) 21  Temp:   97.9 F (36.6 C) 97.9 F (36.6 C)  TempSrc:   Oral Oral  SpO2: 99% 100%  98%  Weight:    94.7 kg  Height:    6\' 2"  (1.88 m)    Constitutional: NAD, calm  Eyes: PERTLA, lids and conjunctivae normal ENMT: Mucous membranes are moist. Posterior pharynx clear of any exudate or lesions.   Neck: normal, supple, no masses, no thyromegaly Respiratory: no wheezing, no crackles. No accessory muscle use.  Cardiovascular: S1 & S2 heard, regular rate and rhythm. Pitting edema to lower legs, Rt > Lt.   Abdomen: No distension, no tenderness, soft. Bowel sounds active.  Musculoskeletal: no clubbing / cyanosis. No joint deformity upper and lower extremities.   Skin: no significant rashes, lesions, ulcers. Warm, dry, well-perfused. Neurologic: Lower facial weakness. No aphasia. Sensation intact. Moving all extremities.  Psychiatric: Alert and oriented to person, place,  and situation. Very pleasant and cooperative.    Labs and Imaging on Admission: I have personally reviewed following labs and imaging studies  CBC: Recent Labs  Lab 02/11/21 2053  WBC 5.9  NEUTROABS 2.7  HGB 14.9  HCT 45.4  MCV 92.3  PLT 322   Basic Metabolic Panel: Recent Labs  Lab 02/09/21 1203 02/11/21 2053  NA 137 133*  K 5.1 4.5  CL 99 101  CO2 22 23  GLUCOSE 80 103*  BUN 17 26*  CREATININE 1.45* 1.30*  CALCIUM 10.0 9.3   GFR: Estimated Creatinine Clearance: 65 mL/min (  A) (by C-G formula based on SCr of 1.3 mg/dL (H)). Liver Function Tests: No results for input(s): AST, ALT, ALKPHOS, BILITOT, PROT, ALBUMIN in the last 168 hours. No results for input(s): LIPASE, AMYLASE in the last 168 hours. No results for input(s): AMMONIA in the last 168 hours. Coagulation Profile: No results for input(s): INR, PROTIME in the last 168 hours. Cardiac Enzymes: No results for input(s): CKTOTAL, CKMB, CKMBINDEX, TROPONINI in the last 168 hours. BNP (last 3 results) Recent Labs    04/28/20 1617 09/07/20 1105 02/09/21 1203  PROBNP 522* 403* 792*   HbA1C: No results for input(s): HGBA1C in the last 72 hours. CBG: No results for input(s): GLUCAP in the last 168 hours. Lipid Profile: No results for input(s): CHOL, HDL, LDLCALC, TRIG, CHOLHDL, LDLDIRECT in the last 72 hours. Thyroid Function Tests: No results for input(s): TSH, T4TOTAL, FREET4, T3FREE, THYROIDAB in the last 72 hours. Anemia Panel: No results for input(s): VITAMINB12, FOLATE, FERRITIN, TIBC, IRON, RETICCTPCT in the last 72 hours. Urine analysis:    Component Value Date/Time   COLORURINE YELLOW 11/04/2016 0508   APPEARANCEUR CLEAR 11/04/2016 0508   LABSPEC 1.019 11/04/2016 0508   PHURINE 6.0 11/04/2016 0508   GLUCOSEU NEGATIVE 11/04/2016 0508   HGBUR NEGATIVE 11/04/2016 0508   BILIRUBINUR NEGATIVE 11/04/2016 0508   KETONESUR NEGATIVE 11/04/2016 0508   PROTEINUR NEGATIVE 11/04/2016 0508   NITRITE  NEGATIVE 11/04/2016 0508   LEUKOCYTESUR NEGATIVE 11/04/2016 0508   Sepsis Labs: @LABRCNTIP (procalcitonin:4,lacticidven:4) )No results found for this or any previous visit (from the past 240 hour(s)).   Radiological Exams on Admission: CT Angio Head W/Cm &/Or Wo Cm  Result Date: 02/11/2021 CLINICAL DATA:  Acute neurologic deficit EXAM: CT ANGIOGRAPHY HEAD AND NECK TECHNIQUE: Multidetector CT imaging of the head and neck was performed using the standard protocol during bolus administration of intravenous contrast. Multiplanar CT image reconstructions and MIPs were obtained to evaluate the vascular anatomy. Carotid stenosis measurements (when applicable) are obtained utilizing NASCET criteria, using the distal internal carotid diameter as the denominator. CONTRAST:  70mL OMNIPAQUE IOHEXOL 350 MG/ML SOLN COMPARISON:  None. FINDINGS: CT HEAD FINDINGS Brain: There is no mass, hemorrhage or extra-axial collection. The size and configuration of the ventricles and extra-axial CSF spaces are normal. There is no acute or chronic infarction. The brain parenchyma is normal. Skull: The visualized skull base, calvarium and extracranial soft tissues are normal. Sinuses/Orbits: No fluid levels or advanced mucosal thickening of the visualized paranasal sinuses. No mastoid or middle ear effusion. The orbits are normal. CTA NECK FINDINGS SKELETON: C3-7 posterior decompression OTHER NECK: Normal pharynx, larynx and major salivary glands. No cervical lymphadenopathy. Unremarkable thyroid gland. UPPER CHEST: No pneumothorax or pleural effusion. No nodules or masses. AORTIC ARCH: There is no calcific atherosclerosis of the aortic arch. There is no aneurysm, dissection or hemodynamically significant stenosis of the visualized portion of the aorta. Conventional 3 vessel aortic branching pattern. The visualized proximal subclavian arteries are widely patent. RIGHT CAROTID SYSTEM: Normal without aneurysm, dissection or stenosis. LEFT  CAROTID SYSTEM: Normal without aneurysm, dissection or stenosis. 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 CTA HEAD FINDINGS POSTERIOR CIRCULATION: --Vertebral arteries: Normal right V4 segment --Inferior cerebellar arteries: Normal. --Basilar artery: Normal. --Superior cerebellar arteries: Normal. --Posterior cerebral arteries (PCA): Normal. Both are predominantly supplied by the posterior communicating arteries (p-comm). ANTERIOR CIRCULATION: --Intracranial internal carotid arteries: Normal. --Anterior cerebral arteries (ACA): Normal. Both A1  segments are present. Patent anterior communicating artery (a-comm). --Middle cerebral arteries (MCA): Normal. VENOUS SINUSES: As permitted by contrast timing, patent. ANATOMIC VARIANTS: Fetal origins of both posterior cerebral arteries. Review of the MIP images confirms the above findings. IMPRESSION: 1. No intracranial arterial occlusion or high-grade stenosis. 2. 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 Aortic Atherosclerosis (ICD10-I70.0). Electronically Signed   By: Ulyses Jarred M.D.   On: 02/11/2021 21:58   CT Angio Neck W and/or Wo Contrast  Result Date: 02/11/2021 CLINICAL DATA:  Acute neurologic deficit EXAM: CT ANGIOGRAPHY HEAD AND NECK TECHNIQUE: Multidetector CT imaging of the head and neck was performed using the standard protocol during bolus administration of intravenous contrast. Multiplanar CT image reconstructions and MIPs were obtained to evaluate the vascular anatomy. Carotid stenosis measurements (when applicable) are obtained utilizing NASCET criteria, using the distal internal carotid diameter as the denominator. CONTRAST:  48mL OMNIPAQUE IOHEXOL 350 MG/ML SOLN COMPARISON:  None. FINDINGS: CT HEAD FINDINGS Brain: There is no mass, hemorrhage  or extra-axial collection. The size and configuration of the ventricles and extra-axial CSF spaces are normal. There is no acute or chronic infarction. The brain parenchyma is normal. Skull: The visualized skull base, calvarium and extracranial soft tissues are normal. Sinuses/Orbits: No fluid levels or advanced mucosal thickening of the visualized paranasal sinuses. No mastoid or middle ear effusion. The orbits are normal. CTA NECK FINDINGS SKELETON: C3-7 posterior decompression OTHER NECK: Normal pharynx, larynx and major salivary glands. No cervical lymphadenopathy. Unremarkable thyroid gland. UPPER CHEST: No pneumothorax or pleural effusion. No nodules or masses. AORTIC ARCH: There is no calcific atherosclerosis of the aortic arch. There is no aneurysm, dissection or hemodynamically significant stenosis of the visualized portion of the aorta. Conventional 3 vessel aortic branching pattern. The visualized proximal subclavian arteries are widely patent. RIGHT CAROTID SYSTEM: Normal without aneurysm, dissection or stenosis. LEFT CAROTID SYSTEM: Normal without aneurysm, dissection or stenosis. 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 CTA HEAD FINDINGS POSTERIOR CIRCULATION: --Vertebral arteries: Normal right V4 segment --Inferior cerebellar arteries: Normal. --Basilar artery: Normal. --Superior cerebellar arteries: Normal. --Posterior cerebral arteries (PCA): Normal. Both are predominantly supplied by the posterior communicating arteries (p-comm). ANTERIOR CIRCULATION: --Intracranial internal carotid arteries: Normal. --Anterior cerebral arteries (ACA): Normal. Both A1 segments are present. Patent anterior communicating artery (a-comm). --Middle cerebral arteries (MCA): Normal. VENOUS SINUSES: As permitted by contrast timing, patent. ANATOMIC VARIANTS: Fetal origins of both posterior cerebral  arteries. Review of the MIP images confirms the above findings. IMPRESSION: 1. No intracranial arterial occlusion or high-grade stenosis. 2. 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 Aortic Atherosclerosis (ICD10-I70.0). Electronically Signed   By: Ulyses Jarred M.D.   On: 02/11/2021 21:58    EKG: Independently reviewed. Sinus rhythm, chronic RBBB, PVCs.   Assessment/Plan   1. Near-syncope  - Presents after multiple episodes of near-syncope while walking, is in SR with chronic RBBB and frequent PVCs, orthostatic vitals negative  - He has Rt >Lt leg swelling and 2 months of scant hemoptysis and SOB raising suspicion for possible PE but has been on Xarelto and does not have CP or hypoxia; he just got contrast for CT in ED, will check LE venous US doppler for now, continue cardiac monitoring, and check echocardiogram  2. Chronic diastolic CHF  - EF was 82-70% in 2018  - Continue Lasix and ARB, monitor weight and I/Os, follow-up echo results    3. Paroxysmal atrial fibrillation  - In sinus rhythm on admission  - CHADS-VASc at least 4 (age, CHF, DM, CAD)  - Continue Xarelto   4. CKD II  - SCr is 1.30 on admission, similar to priors  - Renally-dose medications, monitor   5. Elevated troponin  - HS troponin 110 then 127 without chest pain  - Continue cardiac monitoring, trend troponin, check echocardiogram    6. OSA  - Continue CPAP qHS    7. Type II DM  - Hold metformin after CT contrast in ED, check CBGs and use low-intensity SSI for now     DVT prophylaxis: Xarelto  Code Status: Full  Level of Care: Level of care: Telemetry Cardiac Family Communication: None present  Disposition Plan:  Patient is from: Home  Anticipated d/c is to: home  Anticipated d/c date is: 4/23 or 02/14/21 Patient currently: Pending echocardiogram, LE venous US, cardiac monitoring   Consults called: None   Admission status: Observation     Vianne Bulls, MD [facility]  02/12/2021, 5:12 AM

## 2021-02-12 NOTE — Progress Notes (Signed)
Brief same day note:  Patient is a 65 year old male with history of Bell's palsy, A. fib on Xarelto, coronary artery disease, type 2 diabetes mellitus, chronic diastolic congestive heart failure, OSA on CPAP who presented to the emergency department with complaint of near syncope, 34-month history of increased shortness of breath on exertion, intermittent hemoptysis.  Also complains of swelling on the right ankle.  He was feeling about to pass out but did not report loss of consciousness.  On presentation he was hemodynamically stable.  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  Troponins were mildly elevated.  BNP elevated at 299.  Patient was admitted for the work-up of near syncopal. Patient seen and examined at the bedside this morning.  During my evaluation he was hemodynamically stable, comfortable.  He was on room air.  Denies any chest pain or shortness of breath at rest.  Lungs are clear to auscultation. currently he was in normal sinus rhythm. We have requested for cardiology evaluation because of his cardiac history, high risk.  We will continue current management.  Will check chest x-ray, he was complaining of intermittent hemoptysis.  He has an appointment with pulmonology next week.  Echocardiogram pending.  Venous Doppler was negative for DVT.  He does not look volume overloaded. Patient seen by Dr. Myna Hidalgo this morning.  I agree with assessment and plan.

## 2021-02-12 NOTE — Progress Notes (Signed)
Pt not discharging today. I have placed an order for a 30 day event monitor for near syncope. I have made him an appt with Dr. Irish Lack on May 10 at 2:40 pm.   Ledora Bottcher, PA-C 02/12/2021, 1:43 PM [phone]

## 2021-02-12 NOTE — Progress Notes (Signed)
  Echocardiogram 2D Echocardiogram has been performed.  Jennette Dubin 02/12/2021, 9:39 AM

## 2021-02-13 DIAGNOSIS — R55 Syncope and collapse: Secondary | ICD-10-CM | POA: Diagnosis not present

## 2021-02-13 LAB — BASIC METABOLIC PANEL
Anion gap: 7 (ref 5–15)
BUN: 22 mg/dL (ref 8–23)
CO2: 23 mmol/L (ref 22–32)
Calcium: 9.1 mg/dL (ref 8.9–10.3)
Chloride: 104 mmol/L (ref 98–111)
Creatinine, Ser: 1.26 mg/dL — ABNORMAL HIGH (ref 0.61–1.24)
GFR, Estimated: >60 mL/min (ref >60)
Glucose, Bld: 83 mg/dL (ref 70–99)
Potassium: 4.1 mmol/L (ref 3.5–5.1)
Sodium: 134 mmol/L — ABNORMAL LOW (ref 135–145)

## 2021-02-13 LAB — CBC
HCT: 42.3 % (ref 39.0–52.0)
Hemoglobin: 14.3 g/dL (ref 13.0–17.0)
MCH: 29.9 pg (ref 26.0–34.0)
MCHC: 33.8 g/dL (ref 30.0–36.0)
MCV: 88.5 fL (ref 80.0–100.0)
Platelets: 234 10*3/uL (ref 150–400)
RBC: 4.78 MIL/uL (ref 4.22–5.81)
RDW: 14.4 % (ref 11.5–15.5)
WBC: 4.4 10*3/uL (ref 4.0–10.5)
nRBC: 0 % (ref 0.0–0.2)

## 2021-02-13 LAB — GLUCOSE, CAPILLARY
Glucose-Capillary: 103 mg/dL — ABNORMAL HIGH (ref 70–99)
Glucose-Capillary: 114 mg/dL — ABNORMAL HIGH (ref 70–99)

## 2021-02-13 NOTE — Discharge Summary (Signed)
Physician Discharge Summary  Frank Berg [id number] DOB: 09/02/1956 DOA: 02/11/2021  PCP: [name], MD  Admit date: 02/11/2021 Discharge date: 02/13/2021  Admitted From: Home Disposition:  Home  Discharge Condition:Stable CODE STATUS:FULL Diet recommendation: Heart Healthy    Brief/Interim Summary:  Patient is a 65 year old male with history of Bell's palsy, A. fib on Xarelto, coronary artery disease, type 2 diabetes mellitus, chronic diastolic congestive heart failure, OSA on CPAP who presented to the emergency department with complaint of near syncope, 40-month history of increased shortness of breath on exertion, intermittent hemoptysis.  Also complains of swelling on the right ankle.  He was feeling about to pass out but did not report loss of consciousness.  On presentation he was hemodynamically stable.  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  Troponins were mildly elevated.  BNP elevated at 299.  Patient was admitted for the work-up of near syncope. Cardiology was also consulted after admission because of elevated troponin.  He did not complain of any chest pain during this hospitalization.  Echocardiogram showed ejection fraction of 45%, global hypokinesis, moderate left ventricular hypertrophy, grade 2 diastolic's dysfunction, right ventricular enlargement.  Cardiology recommended outpatient follow-up and consideration of right / left heart catheterization for further evaluation which will be done as an outpatient . Venous Doppler was negative for DVT.  He does not look volume overloaded. He  was complaining of intermittent hemoptysis.  He has an appointment with pulmonology next week.  Chest x-ray done during this hospitalization did not show any acute problems. Patient seen and examined the bedside this morning.  Currently he is hemodynamically stable.  Denies any complaints.  Patient is medically stable for discharge to home today.  We  have recommended him to follow-up with his PCP in a week and do a blood work for checking his kidney function. We have not changed any home medications and he should continue the medication that he was taking before.  Discharge Diagnoses:  Principal Problem:   Near syncope Active Problems:   CAD (coronary artery disease)   PAF (paroxysmal atrial fibrillation) (HCC)   Elevated troponin   OSA on CPAP   CKD (chronic kidney disease), stage II   Chronic diastolic CHF (congestive heart failure) [facility])    Discharge Instructions  Discharge Instructions    Diet - low sodium heart healthy   Complete by: As directed    Discharge instructions   Complete by: As directed    1)Please continue taking your home medications 2)Follow up with your PCP in a week.  Do a BMP test during the follow-up 3)You have been scheduled an appointment with cardiology on 03/02/2021 at 2: 40 p.m.   Increase activity slowly   Complete by: As directed      Allergies as of 02/13/2021      Reactions   Lipitor [atorvastatin] Other (See Comments)   Body aches   Penicillins Swelling   Swollen face      Medication List    TAKE these medications   allopurinol 300 MG tablet Commonly known as: ZYLOPRIM Take 300 mg by mouth daily.   clindamycin 1 % external solution Commonly known as: CLEOCIN T   colchicine 0.6 MG tablet Take 0.6 mg by mouth 2 (two) times daily as needed (flare-ups).   Fish Oil 1200 MG Cpdr Take by mouth daily.   furosemide 20 MG tablet Commonly known as: LASIX Take 20 mg by mouth daily.   magnesium oxide 400 MG tablet  Commonly known as: MAG-OX Take 1 tablet by mouth daily.   metFORMIN 500 MG tablet Commonly known as: GLUCOPHAGE Take 500 mg by mouth 2 (two) times daily with a meal.   potassium chloride 10 MEQ CR capsule Commonly known as: MICRO-K Take 10 mEq by mouth 2 (two) times daily.   rosuvastatin 5 MG tablet Commonly known as: CRESTOR Take 5 mg by mouth every evening.    tadalafil 20 MG tablet Commonly known as: CIALIS Take 20 mg by mouth daily as needed.   telmisartan 40 MG tablet Commonly known as: MICARDIS Take 40 mg by mouth daily.   VITAMIN D PO Take 1 capsule by mouth daily.   Xarelto 20 MG Tabs tablet Generic drug: rivaroxaban Take 1 tablet (20 mg total) by mouth every evening.       Follow-up Information    [name], MD. Schedule an appointment as soon as possible for a visit in 1 week(s).   Specialty: Endocrinology Contact information: [place] [address] [phone]        Jettie Booze, MD .   Specialties: Cardiology, Radiology, Interventional Cardiology Contact information: [id number] [place] [address] [phone]              Allergies  Allergen Reactions  . Lipitor [Atorvastatin] Other (See Comments)    Body aches  . Penicillins Swelling    Swollen face    Consultations:  Cardiology   Procedures/Studies: CT Angio Head W/Cm &/Or Wo Cm  Result Date: 02/11/2021 CLINICAL DATA:  Acute neurologic deficit EXAM: CT ANGIOGRAPHY HEAD AND NECK TECHNIQUE: Multidetector CT imaging of the head and neck was performed using the standard protocol during bolus administration of intravenous contrast. Multiplanar CT image reconstructions and MIPs were obtained to evaluate the vascular anatomy. Carotid stenosis measurements (when applicable) are obtained utilizing NASCET criteria, using the distal internal carotid diameter as the denominator. CONTRAST:  70mL OMNIPAQUE IOHEXOL 350 MG/ML SOLN COMPARISON:  None. FINDINGS: CT HEAD FINDINGS Brain: There is no mass, hemorrhage or extra-axial collection. The size and configuration of the ventricles and extra-axial CSF spaces are normal. There is no acute or chronic infarction. The brain parenchyma is normal. Skull: The visualized skull base, calvarium and extracranial soft tissues are normal. Sinuses/Orbits: No fluid levels or  advanced mucosal thickening of the visualized paranasal sinuses. No mastoid or middle ear effusion. The orbits are normal. CTA NECK FINDINGS SKELETON: C3-7 posterior decompression OTHER NECK: Normal pharynx, larynx and major salivary glands. No cervical lymphadenopathy. Unremarkable thyroid gland. UPPER CHEST: No pneumothorax or pleural effusion. No nodules or masses. AORTIC ARCH: There is no calcific atherosclerosis of the aortic arch. There is no aneurysm, dissection or hemodynamically significant stenosis of the visualized portion of the aorta. Conventional 3 vessel aortic branching pattern. The visualized proximal subclavian arteries are widely patent. RIGHT CAROTID SYSTEM: Normal without aneurysm, dissection or stenosis. LEFT CAROTID SYSTEM: Normal without aneurysm, dissection or stenosis. 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 CTA HEAD FINDINGS POSTERIOR CIRCULATION: --Vertebral arteries: Normal right V4 segment --Inferior cerebellar arteries: Normal. --Basilar artery: Normal. --Superior cerebellar arteries: Normal. --Posterior cerebral arteries (PCA): Normal. Both are predominantly supplied by the posterior communicating arteries (p-comm). ANTERIOR CIRCULATION: --Intracranial internal carotid arteries: Normal. --Anterior cerebral arteries (ACA): Normal. Both A1 segments are present. Patent anterior communicating artery (a-comm). --Middle cerebral arteries (MCA): Normal. VENOUS SINUSES: As permitted by contrast  timing, patent. ANATOMIC VARIANTS: Fetal origins of both posterior cerebral arteries. Review of the MIP images confirms the above findings. IMPRESSION: 1. No intracranial arterial occlusion or high-grade stenosis. 2. 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 Aortic Atherosclerosis (ICD10-I70.0).  Electronically Signed   By: Ulyses Jarred M.D.   On: 02/11/2021 21:58   CT Angio Neck W and/or Wo Contrast  Result Date: 02/11/2021 CLINICAL DATA:  Acute neurologic deficit EXAM: CT ANGIOGRAPHY HEAD AND NECK TECHNIQUE: Multidetector CT imaging of the head and neck was performed using the standard protocol during bolus administration of intravenous contrast. Multiplanar CT image reconstructions and MIPs were obtained to evaluate the vascular anatomy. Carotid stenosis measurements (when applicable) are obtained utilizing NASCET criteria, using the distal internal carotid diameter as the denominator. CONTRAST:  54mL OMNIPAQUE IOHEXOL 350 MG/ML SOLN COMPARISON:  None. FINDINGS: CT HEAD FINDINGS Brain: There is no mass, hemorrhage or extra-axial collection. The size and configuration of the ventricles and extra-axial CSF spaces are normal. There is no acute or chronic infarction. The brain parenchyma is normal. Skull: The visualized skull base, calvarium and extracranial soft tissues are normal. Sinuses/Orbits: No fluid levels or advanced mucosal thickening of the visualized paranasal sinuses. No mastoid or middle ear effusion. The orbits are normal. CTA NECK FINDINGS SKELETON: C3-7 posterior decompression OTHER NECK: Normal pharynx, larynx and major salivary glands. No cervical lymphadenopathy. Unremarkable thyroid gland. UPPER CHEST: No pneumothorax or pleural effusion. No nodules or masses. AORTIC ARCH: There is no calcific atherosclerosis of the aortic arch. There is no aneurysm, dissection or hemodynamically significant stenosis of the visualized portion of the aorta. Conventional 3 vessel aortic branching pattern. The visualized proximal subclavian arteries are widely patent. RIGHT CAROTID SYSTEM: Normal without aneurysm, dissection or stenosis. LEFT CAROTID SYSTEM: Normal without aneurysm, dissection or stenosis. 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 CTA HEAD FINDINGS POSTERIOR CIRCULATION: --Vertebral arteries: Normal right V4 segment --Inferior cerebellar arteries: Normal. --Basilar artery: Normal. --Superior cerebellar arteries: Normal. --Posterior cerebral arteries (PCA): Normal. Both are predominantly supplied by the posterior communicating arteries (p-comm). ANTERIOR CIRCULATION: --Intracranial internal carotid arteries: Normal. --Anterior cerebral arteries (ACA): Normal. Both A1 segments are present. Patent anterior communicating artery (a-comm). --Middle cerebral arteries (MCA): Normal. VENOUS SINUSES: As permitted by contrast timing, patent. ANATOMIC VARIANTS: Fetal origins of both posterior cerebral arteries. Review of the MIP images confirms the above findings. IMPRESSION: 1. No intracranial arterial occlusion or high-grade stenosis. 2. 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 Aortic Atherosclerosis (ICD10-I70.0). Electronically Signed   By: Ulyses Jarred M.D.   On: 02/11/2021 21:58   DG CHEST PORT 1 VIEW  Result Date: 02/12/2021 CLINICAL DATA:  Hemoptysis EXAM: PORTABLE CHEST 1 VIEW COMPARISON:  CT chest dated 12/14/2020 FINDINGS: Pleural thickening at the lateral right lung base, chronic. Left lung is clear. No pleural effusion or pneumothorax. Heart is normal in size.  Prosthetic aortic valve. Median sternotomy. Cervical spine fixation hardware, incompletely visualized. IMPRESSION: No evidence of acute cardiopulmonary disease. Chronic right pleural thickening. Electronically Signed   By: Julian Hy M.D.   On: 02/12/2021 11:52   ECHOCARDIOGRAM COMPLETE  Result Date: 02/12/2021    ECHOCARDIOGRAM REPORT   Patient Name:   Frank Berg Date of Exam: 02/12/2021 Medical Rec #:  [id number]  Height:       74.0 in Accession #:    [id number]      Weight:       208.8 lb Date of Birth:   12/20/1955        BSA:          2.213 m Patient Age:    65 years        BP:           103/77 mmHg Patient Gender: M               HR:           57 bpm. Exam Location:  Inpatient Procedure: 2D Echo Indications:    Syncope R55; Elevated Troponin; Dyspnea R06  History:        Patient has prior history of Echocardiogram examinations, most                 recent 06/21/2017. CAD, Prior CABG; Risk Factors:Hypertension and                 Diabetes.                 Aortic Valve: unknown valve is present in the aortic position.                 Procedure Date: 2011.  Sonographer:    Mikki Santee RDCS (AE) Referring Phys: [id number] [place]  1. Left ventricular ejection fraction, by estimation, is 45%. The left ventricle has mildly decreased function. The left ventricle demonstrates global hypokinesis. There is moderate left ventricular hypertrophy. Left ventricular diastolic parameters are  consistent with Grade II diastolic dysfunction (pseudonormalization). Would consider possibility of cardiac amyloidosis.  2. Right ventricular systolic function is mildly reduced. The right ventricular size is moderately enlarged. There is normal pulmonary artery systolic pressure. The estimated right ventricular systolic pressure is [id number] mmHg. Mildly D-shaped interventricular septum suggests RV pressure/volume overload.  3. Left atrial size was mild to moderately dilated.  4. Right atrial size was moderately dilated.  5. The mitral valve is normal in structure. Mild mitral valve regurgitation. No evidence of mitral stenosis.  6. Bioprosthetic aortic valve. Mean gradient 10 mmHg, valve appears to open well. No significant regurgitation.  7. The inferior vena cava is dilated in size with <50% respiratory variability, suggesting right atrial pressure of 15 mmHg. FINDINGS  Left Ventricle: Left ventricular ejection fraction, by estimation, is 45%. The left ventricle has mildly decreased function. The left ventricle  demonstrates global hypokinesis. The left ventricular internal cavity size was normal in size. There is moderate left ventricular hypertrophy. Left ventricular diastolic parameters are consistent with Grade II diastolic dysfunction (pseudonormalization). Right Ventricle: The right ventricular size is moderately enlarged. No increase in right ventricular wall thickness. Right ventricular systolic function is mildly reduced. There is normal pulmonary artery systolic pressure. The tricuspid regurgitant velocity is 2.10 m/s, and with an assumed right atrial pressure of 15 mmHg, the estimated right ventricular systolic pressure is [id number] mmHg. Left Atrium: Left atrial size was mild to moderately dilated. Right Atrium: Right atrial size was moderately dilated. Pericardium: There is no evidence of pericardial effusion. Mitral Valve: The mitral valve is normal in structure. Mild mitral annular calcification. Mild mitral valve regurgitation. No evidence of mitral valve stenosis. Tricuspid Valve: The tricuspid valve is normal in structure. Tricuspid valve regurgitation is mild. Aortic Valve: Bioprosthetic aortic valve. Mean gradient 10 mmHg, valve appears to open well. No significant regurgitation. The  aortic valve has been repaired/replaced. Aortic valve regurgitation is not visualized. Aortic valve mean gradient measures 10.0  mmHg. Aortic valve peak gradient measures 15.7 mmHg. Aortic valve area, by VTI measures 1.15 cm. There is a unknown valve present in the aortic position. Procedure Date: 2011. Pulmonic Valve: The pulmonic valve was normal in structure. Pulmonic valve regurgitation is trivial. Aorta: The aortic root is normal in size and structure. Venous: The inferior vena cava is dilated in size with less than 50% respiratory variability, suggesting right atrial pressure of 15 mmHg. IAS/Shunts: No atrial level shunt detected by color flow Doppler.  LEFT VENTRICLE PLAX 2D LVIDd:         4.80 cm  Diastology LVIDs:          3.70 cm  LV e' medial:    3.60 cm/s LV PW:         1.70 cm  LV E/e' medial:  18.0 LV IVS:        1.40 cm  LV e' lateral:   6.08 cm/s LVOT diam:     2.30 cm  LV E/e' lateral: 10.6 LV SV:         44 LV SV Index:   20 LVOT Area:     4.15 cm  RIGHT VENTRICLE RV S prime:     5.78 cm/s TAPSE (M-mode): 1.0 cm LEFT ATRIUM              Index       RIGHT ATRIUM           Index LA diam:        4.70 cm  2.12 cm/m  RA Area:     25.80 cm LA Vol (A2C):   105.0 ml 47.44 ml/m RA Volume:   85.70 ml  38.72 ml/m LA Vol (A4C):   45.0 ml  20.33 ml/m LA Biplane Vol: 72.0 ml  32.53 ml/m  AORTIC VALVE AV Area (Vmax):    1.19 cm AV Area (Vmean):   1.11 cm AV Area (VTI):     1.15 cm AV Vmax:           198.00 cm/s AV Vmean:          144.000 cm/s AV VTI:            0.382 m AV Peak Grad:      15.7 mmHg AV Mean Grad:      10.0 mmHg LVOT Vmax:         56.85 cm/s LVOT Vmean:        38.500 cm/s LVOT VTI:          0.106 m LVOT/AV VTI ratio: 0.28  AORTA Ao Root diam: 3.40 cm MITRAL VALVE               TRICUSPID VALVE MV Area (PHT): 3.08 cm    TR Peak grad:   17.6 mmHg MV Decel Time: 246 msec    TR Vmax:        210.00 cm/s MR Peak grad: 45.7 mmHg MR Vmax:      338.00 cm/s  SHUNTS MV E velocity: 64.70 cm/s  Systemic VTI:  0.11 m MV A velocity: 30.80 cm/s  Systemic Diam: 2.30 cm MV E/A ratio:  2.10 Loralie Champagne MD Electronically signed by Loralie Champagne MD Signature Date/Time: 02/12/2021/1:03:17 PM    Final    VAS [place] LOWER EXTREMITY VENOUS (DVT)  Result Date: 02/12/2021  Lower Venous DVT Study Patient Name:  Frank Berg  Date of  Exam:   02/12/2021 Medical Rec #: [id number]        Accession #:    [id number] Date of Birth: 06/07/1956         Patient Gender: M Patient Age:   64Y Exam Location:  [facility] Procedure:      VAS [place] LOWER EXTREMITY VENOUS (DVT) Referring Phys: [id number] TIMOTHY S OPYD --------------------------------------------------------------------------------  Indications: Edema.  Comparison Study: no prior  Performing Technologist: [name] RVS  Examination Guidelines: A complete evaluation includes B-mode imaging, spectral Doppler, color Doppler, and power Doppler as needed of all accessible portions of each vessel. Bilateral testing is considered an integral part of a complete examination. Limited examinations for reoccurring indications may be performed as noted. The reflux portion of the exam is performed with the patient in reverse Trendelenburg.  +---------+---------------+---------+-----------+----------+--------------+ RIGHT    CompressibilityPhasicitySpontaneityPropertiesThrombus Aging +---------+---------------+---------+-----------+----------+--------------+ CFV      Full           Yes      Yes                                 +---------+---------------+---------+-----------+----------+--------------+ SFJ      Full                                                        +---------+---------------+---------+-----------+----------+--------------+ FV Prox  Full                                                        +---------+---------------+---------+-----------+----------+--------------+ FV Mid   Full                                                        +---------+---------------+---------+-----------+----------+--------------+ FV DistalFull                                                        +---------+---------------+---------+-----------+----------+--------------+ PFV      Full                                                        +---------+---------------+---------+-----------+----------+--------------+ POP      Full           Yes      Yes                                 +---------+---------------+---------+-----------+----------+--------------+ PTV      Full                                                        +---------+---------------+---------+-----------+----------+--------------+  PERO     Full                                                         +---------+---------------+---------+-----------+----------+--------------+   +----+---------------+---------+-----------+----------+--------------+ LEFTCompressibilityPhasicitySpontaneityPropertiesThrombus Aging +----+---------------+---------+-----------+----------+--------------+ CFV Full           Yes      Yes                                 +----+---------------+---------+-----------+----------+--------------+  53   Summary: RIGHT: - There is no evidence of deep vein thrombosis in the lower extremity.  - No cystic structure found in the popliteal fossa.  LEFT: - No evidence of common femoral vein obstruction.  *See table(s) above for measurements and observations. Electronically signed by Jamelle Haring on 02/12/2021 at 5:20:18 PM.    Final        Subjective: Patient seen and examined the bedside this morning.  Hemodynamically stable for discharge.  Discharge Exam: Vitals:   02/13/21 0640 02/13/21 0750  BP: 114/68 101/72  Pulse: 66 63  Resp: 16 18  Temp: 98.3 F (36.8 C) 98.3 F (36.8 C)  SpO2: 98% 100%   Vitals:   02/12/21 1900 02/12/21 2214 02/13/21 0640 02/13/21 0750  BP: 96/74  114/68 101/72  Pulse: 65  66 63  Resp: 18 20 16 18   Temp: 98 F (36.7 C)  98.3 F (36.8 C) 98.3 F (36.8 C)  TempSrc: Oral  Oral Oral  SpO2: 100%  98% 100%  Weight:   93 kg   Height:        General: Pt is alert, awake, not in acute distress Cardiovascular: RRR, S1/S2 +, no rubs, no gallops Respiratory: CTA bilaterally, no wheezing, no rhonchi Abdominal: Soft, NT, ND, bowel sounds + Extremities: no edema, no cyanosis    The results of significant diagnostics from this hospitalization (including imaging, microbiology, ancillary and laboratory) are listed below for reference.     Microbiology: Recent Results (from the past 240 hour(s))  SARS CORONAVIRUS 2 (TAT 6-24 HRS) Nasopharyngeal Nasopharyngeal Swab     Status: None   Collection Time: 02/12/21   3:28 AM   Specimen: Nasopharyngeal Swab  Result Value Ref Range Status   SARS Coronavirus 2 NEGATIVE NEGATIVE Final    Comment: (NOTE) SARS-CoV-2 target nucleic acids are NOT DETECTED.  The SARS-CoV-2 RNA is generally detectable in upper and lower respiratory specimens during the acute phase of infection. Negative results do not preclude SARS-CoV-2 infection, do not rule out co-infections with other pathogens, and should not be used as the sole basis for treatment or other patient management decisions. Negative results must be combined with clinical observations, patient history, and epidemiological information. The expected result is Negative.  Fact Sheet for Patients: SugarRoll.be  Fact Sheet for Healthcare Providers: https://www.woods-mathews.com/  This test is not yet approved or cleared by the [place] FDA and  has been authorized for detection and/or diagnosis of SARS-CoV-2 by FDA under an Emergency Use Authorization (EUA). This EUA will remain  in effect (meaning this test can be used) for the duration of the COVID-19 declaration under Se ction 564(b)(1) of the Act, 21 U.S.C. section 360bbb-3(b)(1), unless the authorization is terminated or revoked sooner.  Performed at [facility], [address] [place], [place] [address]   MRSA PCR Screening     Status: None   Collection Time: 02/12/21  4:55 AM   Specimen: Nasopharyngeal  Result Value Ref Range Status   MRSA by PCR NEGATIVE NEGATIVE Final    Comment:        The GeneXpert MRSA Assay (FDA approved for NASAL specimens only), is one component of a comprehensive MRSA colonization surveillance program. It is not intended to diagnose MRSA infection nor to guide or monitor treatment for MRSA infections. Performed at [facility], [place] [address] [place], [place] [address]      Labs: BNP (last 3 results) Recent Labs    02/12/21 0104  BNP 199.6*    Basic Metabolic Panel: Recent Labs  Lab 02/09/21 1203 02/11/21 2053 02/12/21 0757 02/13/21 0109  NA 137 133* 135 134*  K 5.1 4.5 3.9 4.1  CL 99 101 105 104  CO2 22 23 22 23   GLUCOSE 80 103* 114* 83  BUN 17 26* 23 22  CREATININE 1.45* 1.30* 1.17 1.26*  CALCIUM 10.0 9.3 9.0 9.1  MG  --   --  1.7  --    Liver Function Tests: No results for input(s): AST, ALT, ALKPHOS, BILITOT, PROT, ALBUMIN in the last 168 hours. No results for input(s): LIPASE, AMYLASE in the last 168 hours. No results for input(s): AMMONIA in the last 168 hours. CBC: Recent Labs  Lab 02/11/21 2053 02/12/21 0757 02/13/21 0109  WBC 5.9 5.6 4.4  NEUTROABS 2.7  --   --   HGB 14.9 14.5 14.3  HCT 45.4 44.3 42.3  MCV 92.3 92.5 88.5  PLT 246 224 234   Cardiac Enzymes: No results for input(s): CKTOTAL, CKMB, CKMBINDEX, TROPONINI in the last 168 hours. BNP: Invalid input(s): POCBNP CBG: Recent Labs  Lab 02/12/21 0640 02/12/21 1133 02/12/21 1621 02/12/21 2127 02/13/21 0635  GLUCAP 127* 79 90 159* 103*   D-Dimer No results for input(s): DDIMER in the last 72 hours. Hgb A1c Recent Labs    02/12/21 0757  HGBA1C 6.9*   Lipid Profile No results for input(s): CHOL, HDL, LDLCALC, TRIG, CHOLHDL, LDLDIRECT in the last 72 hours. Thyroid function studies No results for input(s): TSH, T4TOTAL, T3FREE, THYROIDAB in the last 72 hours.  Invalid input(s): FREET3 Anemia work up No results for input(s): VITAMINB12, FOLATE, FERRITIN, TIBC, IRON, RETICCTPCT in the last 72 hours. Urinalysis    Component Value Date/Time   COLORURINE YELLOW 11/04/2016 0508   APPEARANCEUR CLEAR 11/04/2016 0508   LABSPEC 1.019 11/04/2016 0508   PHURINE 6.0 11/04/2016 0508   GLUCOSEU NEGATIVE 11/04/2016 0508   HGBUR NEGATIVE 11/04/2016 0508   BILIRUBINUR NEGATIVE 11/04/2016 0508   KETONESUR NEGATIVE 11/04/2016 0508   PROTEINUR NEGATIVE 11/04/2016 0508   NITRITE NEGATIVE 11/04/2016 0508   LEUKOCYTESUR NEGATIVE 11/04/2016  0508   Sepsis Labs Invalid input(s): PROCALCITONIN,  WBC,  LACTICIDVEN Microbiology Recent Results (from the past 240 hour(s))  SARS CORONAVIRUS 2 (TAT 6-24 HRS) Nasopharyngeal Nasopharyngeal Swab     Status: None   Collection Time: 02/12/21  3:28 AM   Specimen: Nasopharyngeal Swab  Result Value Ref Range Status   SARS Coronavirus 2 NEGATIVE NEGATIVE Final    Comment: (NOTE) SARS-CoV-2 target nucleic acids are NOT DETECTED.  The SARS-CoV-2 RNA is generally detectable in upper and lower respiratory specimens during the acute phase of infection. Negative results do not preclude SARS-CoV-2 infection, do not rule out co-infections with other pathogens, and should not be used  as the sole basis for treatment or other patient management decisions. Negative results must be combined with clinical observations, patient history, and epidemiological information. The expected result is Negative.  Fact Sheet for Patients: SugarRoll.be  Fact Sheet for Healthcare Providers: https://www.woods-mathews.com/  This test is not yet approved or cleared by the [place] FDA and  has been authorized for detection and/or diagnosis of SARS-CoV-2 by FDA under an Emergency Use Authorization (EUA). This EUA will remain  in effect (meaning this test can be used) for the duration of the COVID-19 declaration under Se ction 564(b)(1) of the Act, 21 U.S.C. section 360bbb-3(b)(1), unless the authorization is terminated or revoked sooner.  Performed at [facility], [place] [address] [place], [place] [address]   MRSA PCR Screening     Status: None   Collection Time: 02/12/21  4:55 AM   Specimen: Nasopharyngeal  Result Value Ref Range Status   MRSA by PCR NEGATIVE NEGATIVE Final    Comment:        The GeneXpert MRSA Assay (FDA approved for NASAL specimens only), is one component of a comprehensive MRSA colonization surveillance program. It is not intended  to diagnose MRSA infection nor to guide or monitor treatment for MRSA infections. Performed at [facility], [place] [address], [place], [place] [address]     Please note: You were cared for by a hospitalist during your hospital stay. Once you are discharged, your primary care physician will handle any further medical issues. Please note that NO REFILLS for any discharge medications will be authorized once you are discharged, as it is imperative that you return to your primary care physician (or establish a relationship with a primary care physician if you do not have one) for your post hospital discharge needs so that they can reassess your need for medications and monitor your lab values.    Time coordinating discharge: 40 minutes  SIGNED:   Shelly Coss, MD  [facility] 02/13/2021, 10:36 AM Pager [phone]  If 7PM-7AM, please contact night-coverage [email or web address] Password [email or web address]

## 2021-02-16 ENCOUNTER — Institutional Professional Consult (permissible substitution): Payer: Medicare Other | Admitting: Pulmonary Disease

## 2021-02-18 ENCOUNTER — Ambulatory Visit (INDEPENDENT_AMBULATORY_CARE_PROVIDER_SITE_OTHER): Payer: Medicare Other

## 2021-02-18 ENCOUNTER — Encounter: Payer: Self-pay | Admitting: Interventional Cardiology

## 2021-02-18 DIAGNOSIS — R55 Syncope and collapse: Secondary | ICD-10-CM

## 2021-02-23 ENCOUNTER — Ambulatory Visit: Payer: Medicare Other | Admitting: Internal Medicine

## 2021-02-23 ENCOUNTER — Encounter: Payer: Self-pay | Admitting: Internal Medicine

## 2021-02-23 VITALS — BP 80/50 | HR 72 | Ht 72.0 in | Wt 213.4 lb

## 2021-02-23 DIAGNOSIS — E8881 Metabolic syndrome: Secondary | ICD-10-CM | POA: Diagnosis not present

## 2021-02-23 DIAGNOSIS — R932 Abnormal findings on diagnostic imaging of liver and biliary tract: Secondary | ICD-10-CM

## 2021-02-23 DIAGNOSIS — R042 Hemoptysis: Secondary | ICD-10-CM

## 2021-02-23 DIAGNOSIS — E669 Obesity, unspecified: Secondary | ICD-10-CM

## 2021-02-23 NOTE — Patient Instructions (Signed)
If you are age 67 or older, your body mass index should be between 23-30. Your Body mass index is 28.94 kg/m. If this is out of the aforementioned range listed, please consider follow up with your Primary Care Provider.  You have been scheduled for an abdominal ultrasound at [facility] (1st floor of hospital) on 02/26/2021 at 8:30 am. Please arrive 15 minutes prior to your appointment for registration. Make certain not to have anything to eat or drink starting at midnight. Should you need to reschedule your appointment, please contact radiology at [phone]. This test typically takes about 30 minutes to perform.  We will call you with the results of your Ultrasound and discuss a follow up at that time.

## 2021-02-23 NOTE — Progress Notes (Signed)
Frank Berg 65 y.o. Apr 20, 1956 [id number]  Assessment and plan   Encounter Diagnoses  Name Primary?  . Abnormal CT of liver Yes  . Abdominal obesity and metabolic syndrome   . Hemoptysis-resolved     I explained that he has not had hematemesis or signs referable to the GI tract this is where this bleeding is concerned.  Fortunately it has resolved.  Follow-up with pulmonary as appropriate or planned.  Complete abdominal ultrasound with hepatic elastography will be ordered to evaluate the liver further.  If he does have cirrhosis it is probably well compensated given that there is no thrombocytopenia.  We had a good discussion about controlling belly fat he is made inroads and that it sounds like considering the weight loss and looking at his exam today but I do think increasing omega-3 fatty acids in diet and doing some intermittent fasting (he is working on that some he says) and losing more weight could potentially allow him to come off metformin and improve his liver health as he has at risk of fatty liver and cirrhosis from that.    Subjective:   Chief Complaint: Spitting up blood  HPI Frank Berg is a 65 year old man known to me from prior procedure for heme positive stool, in February of this year he had a diminutive adenoma and hemorrhoids.  He had messaged me about spitting up blood a while back and I recommended he get help though it sounded like he was either coughing or just spitting up bloody saliva so he saw ENT and had a clean exam with an laryngoscopy and he has had some pulmonary appointments but I do not think he has been able to make those yet.  He had a syncopal episode or near syncopal episode and was hospitalized in April.  In February he had a chest CT that did not show a cause of hemoptysis.  Hematemesis has not been an issue as best I can tell.  He had some heartburn issues a few months ago but not now.  He does have sleep apnea and uses CPAP.  On his chest CT  in February it was noted that the contour of the liver was consistent with possible cirrhosis.  His LFTs have been normal as best I can tell looking back through Dr. Duard Berg records available through care everywhere his platelets are normal as well.  His hemoptysis has stopped not in several months.  Note he does take Xarelto.  He reports that he has used low-carb/keto in the past and he is trying to control his belly fat and keep weight off and to control his diabetes.  Over time he has lost weight saying he was 260 pounds at 1 point.  Wt Readings from Last 3 Encounters:  02/23/21 213 lb 6 oz (96.8 kg)  02/13/21 205 lb 0.4 oz (93 kg)  11/25/20 223 lb (101.2 kg)    Allergies  Allergen Reactions  . Lipitor [Atorvastatin] Other (See Comments)    Body aches  . Penicillins Swelling    Swollen face   Current Meds  Medication Sig  . allopurinol (ZYLOPRIM) 300 MG tablet Take 300 mg by mouth daily.  . clindamycin (CLEOCIN T) 1 % external solution   . colchicine 0.6 MG tablet Take 0.6 mg by mouth 2 (two) times daily as needed (flare-ups).  . furosemide (LASIX) 20 MG tablet Take 20 mg by mouth daily.  . magnesium oxide (MAG-OX) 400 MG tablet Take 1 tablet by mouth daily.  Marland Kitchen  metFORMIN (GLUCOPHAGE) 500 MG tablet Take 500 mg by mouth 2 (two) times daily with a meal.  . Omega-3 Fatty Acids (FISH OIL) 1200 MG CPDR Take by mouth daily.  . potassium chloride (MICRO-K) 10 MEQ CR capsule Take 10 mEq by mouth 2 (two) times daily.  . rosuvastatin (CRESTOR) 5 MG tablet Take 5 mg by mouth every evening.  . tadalafil (CIALIS) 20 MG tablet Take 20 mg by mouth daily as needed.  Marland Kitchen VITAMIN D PO Take 1 capsule by mouth daily.  Alveda Reasons 20 MG TABS tablet Take 1 tablet (20 mg total) by mouth every evening.   Past Medical History:  Diagnosis Date  . Atrial fibrillation ([place])   . Coronary artery disease   . Diabetes mellitus without complication ([place])   . Diabetic polyneuropathy ([place])   . GERD (gastroesophageal  reflux disease)   . Gout, unspecified   . HLD (hyperlipidemia)   . Hx of adenomatous polyp of colon 12/03/2020  . Hypertension   . OSA on CPAP   . Sleep apnea    Past Surgical History:  Procedure Laterality Date  . ABLATION    . AORTIC VALVE REPLACEMENT  2011  . CERVICAL DISC SURGERY     spinal stenosis  . COLONOSCOPY    . ESOPHAGOGASTRODUODENOSCOPY    . 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   Review of Systems As per HPI  Objective:   Physical Exam BP (!) 80/50 (BP Location: Left Arm, Patient Position: Sitting, Cuff Size: Normal)   Pulse 72 Comment: irregular  Ht 6' (1.829 m)   Wt 213 lb 6 oz (96.8 kg)   BMI 28.94 kg/m  Well-developed well-nourished black man in no acute distress Mild abdominal obesity soft nontender no organomegaly or mass

## 2021-02-26 ENCOUNTER — Telehealth: Payer: Self-pay | Admitting: *Deleted

## 2021-02-26 ENCOUNTER — Other Ambulatory Visit: Payer: Self-pay

## 2021-02-26 ENCOUNTER — Ambulatory Visit (HOSPITAL_COMMUNITY)
Admission: RE | Admit: 2021-02-26 | Discharge: 2021-02-26 | Disposition: A | Discharge status: Home / Self Care | Payer: Medicare Other | Source: Ambulatory Visit | Attending: Internal Medicine | Admitting: Internal Medicine

## 2021-02-26 DIAGNOSIS — R932 Abnormal findings on diagnostic imaging of liver and biliary tract: Secondary | ICD-10-CM

## 2021-02-26 DIAGNOSIS — E669 Obesity, unspecified: Secondary | ICD-10-CM

## 2021-02-26 DIAGNOSIS — E8881 Metabolic syndrome: Secondary | ICD-10-CM | POA: Diagnosis present

## 2021-02-26 NOTE — Telephone Encounter (Signed)
Received call from [employer]. Auto detected 14 beats of VT -rate 194 followed by SR with bigeminy --38 additional PVC's during recording. Event occurred on 5/6 at 7:29 AM central time.  [employer] was unable to reach patient but did leave message. Strip to be faxed to office.  I spoke with patient. He reports he was walking in to have ultrasound done about that time.  May have felt a little winded.  Not aware of fast heart rate or any palpitations.

## 2021-02-26 NOTE — Telephone Encounter (Signed)
Strip reviewed by Dr Angelena Form (DOD).  Patient should follow up with Dr. Irish Lack as planned on 03/02/21.   Patient aware of this appointment.

## 2021-03-01 ENCOUNTER — Telehealth: Payer: Self-pay

## 2021-03-01 NOTE — H&P (View-Only) (Signed)
Cardiology Office Note   Date:  03/02/2021   ID:  Frank Berg, DOB 12-30-1955, MRN [id number]  PCP:  Frank Prince, MD    No chief complaint on file.  CAD, s/p AVR  Wt Readings from Last 3 Encounters:  03/02/21 209 lb 12.8 oz (95.2 kg)  02/23/21 213 lb 6 oz (96.8 kg)  02/13/21 205 lb 0.4 oz (93 kg)       History of Present Illness: Frank Berg is a 65 y.o. male  with a history of aortic valve replacement in 2011.  He had a heart murmur diagnosed at age 50. Presumably, this was from a bicuspid valve.   In 2011, he had an AVR at age 71. It was a bovine valve. He takes SBE prophylaxis with doxycycline. He had a stent placed in 2008. He had a one vessel CABG at the time of valve replacement. Chest burning was anginal sx.  He had AFib and subsequently had an AFib ablation in 12/17. No recurrences of the AFib.   He moved to [facility] from [place] to be in a warmer place with lower taxes.  He has been on Xarelto since that time. Hewanted to come offXareltoafter ablation.  His sx with AFib were DOE. He could not sense palpitatons. He took amiodarone and metoprolol for a short time before the ablation.  Note from [facility]2018, Frank Berg states: "S/P cryoablation of arrhythmia  10/07/16: Cryoablation/PVI and RFA of baseline AFLutter Ablated to sinus Then transseptal to LA Isolated PVs  Frank Berg is doing very well after ablation for atrial flutter and fibrillation, maintaining sinus rhythm. He had his amiodarone stopped, and continues to maintain sinus.  We also discussed anticoagulation it is unclear as to whether is ever safe to come off anticoagulation when the risk score suggests a patient with atrial fibrillation should be on, and since he has only been off amiodarone for a month or 2 we will keep him on anticoagulation for the short term. Maybe at the year mark if he has no more atrial fibrillation we can contemplate coming off if he is  willing to accept the risk of recurrent, asymptomatic atrial fibrillation."  He saw Frank Berg in 2019 regarding stopping anticoagulation: "We discussed that AHA/ACC/HRS guidelines would say that he should continue long term anticoagulation. We also discussed that there is in the guidelines allowance for monitoring post ablation with an implantable loop recorder. Risks and benefits of ILR were discussed at length today. I think that this would be a good option for him. If he has no afib on ILR then we could stop anticoagualtion and then resume if ever AF was detected."  He decided not to have ILR placed.   His son got sick with COVID in 2020 and was in the ICU.Son recovered after being intubated.  In 01/2020, he had ortho issues that have limited walking. He had dizziness with change in positions.   Hospitalized in 01/2021 with near syncope: "On presentation he was hemodynamically stable. 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 Troponins were mildly elevated. BNP elevated at 299. Patient was admitted for the work-up of near syncope. Cardiology was also consulted after admission because of elevated troponin.  He did not complain of any chest pain during this hospitalization. Echocardiogram showed ejection fraction of 45%, global hypokinesis, moderate left ventricular hypertrophy, grade 2 diastolic's dysfunction, right ventricular enlargement.  Cardiology recommended outpatient follow-up and consideration of right / left heart catheterization for further evaluation  which will be done as an outpatient. Venous Doppler was negative for DVT. He does not look volume overloaded. He  was complaining of intermittent hemoptysis. He has an appointment with pulmonology next week.  Chest x-ray done during this hospitalization did not show any acute problems."  Still has some DOE.  His exercise has dropped off in the past few weeks. 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 Just started and no SHOB.    Has had some elevated LFTs. Seeing Frank Berg.   Has had some left sided chest pain and left upper quadrant pain at random times.  Occasionally with exertion.     Past Medical History:  Diagnosis Date  . Atrial fibrillation ([place])   . Coronary artery disease   . Diabetes mellitus without complication ([place])   . Diabetic polyneuropathy ([place])   . GERD (gastroesophageal reflux disease)   . Gout, unspecified   . HLD (hyperlipidemia)   . Hx of adenomatous polyp of colon 12/03/2020  . Hypertension   . OSA on CPAP   . Sleep apnea     Past Surgical History:  Procedure Laterality Date  . ABLATION    . AORTIC VALVE REPLACEMENT  2011  . CERVICAL DISC SURGERY     spinal stenosis  . COLONOSCOPY    . ESOPHAGOGASTRODUODENOSCOPY    . TONSILLECTOMY       Current Outpatient Medications  Medication Sig Dispense Refill  . allopurinol (ZYLOPRIM) 300 MG tablet Take 300 mg by mouth daily.    . clindamycin (CLEOCIN T) 1 % external solution     . colchicine 0.6 MG tablet Take 0.6 mg by mouth 2 (two) times daily as needed (flare-ups).    . furosemide (LASIX) 20 MG tablet Take 20 mg by mouth daily.    . magnesium oxide (MAG-OX) 400 MG tablet Take 1 tablet by mouth daily.    . metFORMIN (GLUCOPHAGE) 500 MG tablet Take 500 mg by mouth 2 (two) times daily with a meal.    . Omega-3 Fatty Acids (FISH OIL) 1200 MG CPDR Take by mouth daily.    . potassium chloride (MICRO-K) 10 MEQ CR capsule Take 10 mEq by mouth 2 (two) times daily.    . rosuvastatin (CRESTOR) 5 MG tablet Take 5 mg by mouth every evening.    . tadalafil (CIALIS) 20 MG tablet Take 20 mg by mouth daily as needed.    Marland Kitchen telmisartan (MICARDIS) 40 MG tablet Take 40 mg by mouth daily.    Marland Kitchen VITAMIN D PO Take 1 capsule by mouth daily.    Alveda Reasons 20 MG TABS tablet Take 1 tablet (20 mg total) by mouth every evening. 90 tablet 3   No current facility-administered medications for this visit.     Allergies:   Lipitor [atorvastatin] and Penicillins    Social History:  The patient  reports that he quit smoking about 39 years ago. He has never used smokeless tobacco. He reports current alcohol use. He reports that he does not use drugs.   Family History:  The patient's family history includes Breast cancer in his sister; Cervical cancer in his sister; Congenital heart disease in his mother; Diabetes in his brother, brother, mother, sister, sister, sister, sister, sister, and son; Heart attack in his father; Heart disease in his mother; Hypertension in his brother and mother; Pancreatic cancer in his brother.    ROS:  Please see the history of present illness.   Otherwise, review of systems are  positive for DOE.   All other systems are reviewed and negative.    PHYSICAL EXAM: VS:  BP 112/72   Pulse 82   Ht 6' (1.829 m)   Wt 209 lb 12.8 oz (95.2 kg)   SpO2 98%   BMI 28.45 kg/m  , BMI Body mass index is 28.45 kg/m. GEN: Well nourished, well developed, in no acute distress  HEENT: normal  Neck: no JVD, carotid bruits, or masses Cardiac: RRR; no murmurs, rubs, or gallops,no edema  Respiratory:  clear to auscultation bilaterally, normal work of breathing GI: soft, nontender, nondistended, + BS MS: no deformity or atrophy  Skin: warm and dry, no rash Neuro:  Strength and sensation are intact Psych: euthymic mood, full affect   EKG:   The ekg ordered today demonstrates    Recent Labs: 02/09/2021: NT-Pro BNP 792 02/12/2021: B Natriuretic Peptide 199.6; Magnesium 1.7 02/13/2021: BUN 22; Creatinine, Ser 1.26; Hemoglobin 14.3; Platelets 234; Potassium 4.1; Sodium 134   Lipid Panel No results found for: CHOL, TRIG, HDL, CHOLHDL, VLDL, LDLCALC, LDLDIRECT   Other studies Reviewed: Additional studies/ records that were reviewed today with results demonstrating: Monitor revealed 28 seconds of SVT with underlying bundle branch block..   ASSESSMENT AND PLAN:  1. Abnormal echo:  EF 45%.  Plan for R/L heart cath.  Left wrist preferable since he has a vein graft. 2. Anticoagulated: He will need to hold his Xarelto 2 days prior to the cardiac cath. 3. S/p AVR: SBE prophylaxis.  Echocardiogram showed normally functioning aortic valve. 4. PreSyncope- worse with walking.  Has not fully passed out. Concern for ischemia in the RCA territory.  Stay well-hydrated.  Cardiac catheterization was discussed with the patient fully. The patient understands that risks include but are not limited to stroke (1 in 1000), death (1 in 80), kidney failure [usually temporary] (1 in 500), bleeding (1 in 200), allergic reaction [possibly serious] (1 in 200).  The patient understands and is willing to proceed.      Current medicines are reviewed at length with the patient today.  The patient concerns regarding his medicines were addressed.  The following changes have been made:  No change  Labs/ tests ordered today include:  No orders of the defined types were placed in this encounter.   Recommend 150 minutes/week of aerobic exercise Low fat, low carb, high fiber diet recommended  Disposition:   FU for cath   Signed, Larae Grooms, MD  03/02/2021 2:36 PM    [facility] [place], [place], [place]  [address] Phone: [phone]; Fax: [phone]

## 2021-03-01 NOTE — Telephone Encounter (Signed)
Call went to VM.  Advised strip received with further tachycardia.  Requested call back if symptomatic.  Pt scheduled to see Dr. Irish Lack 03/02/2021.  Will place strips in box to review.

## 2021-03-01 NOTE — Progress Notes (Signed)
Cardiology Office Note   Date:  03/02/2021   ID:  Frank Berg, DOB 14-Oct-1956, MRN [id number]  PCP:  [name], MD    No chief complaint on file.  CAD, s/p AVR  Wt Readings from Last 3 Encounters:  03/02/21 209 lb 12.8 oz (95.2 kg)  02/23/21 213 lb 6 oz (96.8 kg)  02/13/21 205 lb 0.4 oz (93 kg)       History of Present Illness: Frank Berg is a 65 y.o. male  with a history of aortic valve replacement in 2011.  He had a heart murmur diagnosed at age 38. Presumably, this was from a bicuspid valve.   In 2011, he had an AVR at age 57. It was a bovine valve. He takes SBE prophylaxis with doxycycline. He had a stent placed in 2008. He had a one vessel CABG at the time of valve replacement. Chest burning was anginal sx.  He had AFib and subsequently had an AFib ablation in 12/17. No recurrences of the AFib.   He moved to [facility] from [place] to be in a warmer place with lower taxes.  He has been on Xarelto since that time. Hewanted to come offXareltoafter ablation.  His sx with AFib were DOE. He could not sense palpitatons. He took amiodarone and metoprolol for a short time before the ablation.  Note from [facility]2018, Dr. Lubertha Basque states: "S/P cryoablation of arrhythmia  10/07/16: Cryoablation/PVI and RFA of baseline AFLutter Ablated to sinus Then transseptal to LA Isolated PVs  Mr. Jafri is doing very well after ablation for atrial flutter and fibrillation, maintaining sinus rhythm. He had his amiodarone stopped, and continues to maintain sinus.  We also discussed anticoagulation it is unclear as to whether is ever safe to come off anticoagulation when the risk score suggests a patient with atrial fibrillation should be on, and since he has only been off amiodarone for a month or 2 we will keep him on anticoagulation for the short term. Maybe at the year mark if he has no more atrial fibrillation we can contemplate coming off if he is  willing to accept the risk of recurrent, asymptomatic atrial fibrillation."  He saw Dr. Rayann Heman in 2019 regarding stopping anticoagulation: "We discussed that AHA/ACC/HRS guidelines would say that he should continue long term anticoagulation. We also discussed that there is in the guidelines allowance for monitoring post ablation with an implantable loop recorder. Risks and benefits of ILR were discussed at length today. I think that this would be a good option for him. If he has no afib on ILR then we could stop anticoagualtion and then resume if ever AF was detected."  He decided not to have ILR placed.   His son got sick with COVID in 2020 and was in the ICU.Son recovered after being intubated.  In 01/2020, he had ortho issues that have limited walking. He had dizziness with change in positions.   Hospitalized in 01/2021 with near syncope: "On presentation he was hemodynamically stable. 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 Troponins were mildly elevated. BNP elevated at 299. Patient was admitted for the work-up of near syncope. Cardiology was also consulted after admission because of elevated troponin.  He did not complain of any chest pain during this hospitalization. Echocardiogram showed ejection fraction of 45%, global hypokinesis, moderate left ventricular hypertrophy, grade 2 diastolic's dysfunction, right ventricular enlargement.  Cardiology recommended outpatient follow-up and consideration of right / left heart catheterization for further evaluation  which will be done as an outpatient. Venous Doppler was negative for DVT. He does not look volume overloaded. He  was complaining of intermittent hemoptysis. He has an appointment with pulmonology next week.  Chest x-ray done during this hospitalization did not show any acute problems."  Still has some DOE.  His exercise has dropped off in the past few weeks. 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 Just started and no SHOB.    Has had some elevated LFTs. Seeing Dr. Carlean Purl.   Has had some left sided chest pain and left upper quadrant pain at random times.  Occasionally with exertion.     Past Medical History:  Diagnosis Date  . Atrial fibrillation ([place])   . Coronary artery disease   . Diabetes mellitus without complication ([place])   . Diabetic polyneuropathy ([place])   . GERD (gastroesophageal reflux disease)   . Gout, unspecified   . HLD (hyperlipidemia)   . Hx of adenomatous polyp of colon 12/03/2020  . Hypertension   . OSA on CPAP   . Sleep apnea     Past Surgical History:  Procedure Laterality Date  . ABLATION    . AORTIC VALVE REPLACEMENT  2011  . CERVICAL DISC SURGERY     spinal stenosis  . COLONOSCOPY    . ESOPHAGOGASTRODUODENOSCOPY    . TONSILLECTOMY       Current Outpatient Medications  Medication Sig Dispense Refill  . allopurinol (ZYLOPRIM) 300 MG tablet Take 300 mg by mouth daily.    . clindamycin (CLEOCIN T) 1 % external solution     . colchicine 0.6 MG tablet Take 0.6 mg by mouth 2 (two) times daily as needed (flare-ups).    . furosemide (LASIX) 20 MG tablet Take 20 mg by mouth daily.    . magnesium oxide (MAG-OX) 400 MG tablet Take 1 tablet by mouth daily.    . metFORMIN (GLUCOPHAGE) 500 MG tablet Take 500 mg by mouth 2 (two) times daily with a meal.    . Omega-3 Fatty Acids (FISH OIL) 1200 MG CPDR Take by mouth daily.    . potassium chloride (MICRO-K) 10 MEQ CR capsule Take 10 mEq by mouth 2 (two) times daily.    . rosuvastatin (CRESTOR) 5 MG tablet Take 5 mg by mouth every evening.    . tadalafil (CIALIS) 20 MG tablet Take 20 mg by mouth daily as needed.    Marland Kitchen telmisartan (MICARDIS) 40 MG tablet Take 40 mg by mouth daily.    Marland Kitchen VITAMIN D PO Take 1 capsule by mouth daily.    Alveda Reasons 20 MG TABS tablet Take 1 tablet (20 mg total) by mouth every evening. 90 tablet 3   No current facility-administered medications for this visit.     Allergies:   Lipitor [atorvastatin] and Penicillins    Social History:  The patient  reports that he quit smoking about 39 years ago. He has never used smokeless tobacco. He reports current alcohol use. He reports that he does not use drugs.   Family History:  The patient's family history includes Breast cancer in his sister; Cervical cancer in his sister; Congenital heart disease in his mother; Diabetes in his brother, brother, mother, sister, sister, sister, sister, sister, and son; Heart attack in his father; Heart disease in his mother; Hypertension in his brother and mother; Pancreatic cancer in his brother.    ROS:  Please see the history of present illness.   Otherwise, review of systems are  positive for DOE.   All other systems are reviewed and negative.    PHYSICAL EXAM: VS:  BP 112/72   Pulse 82   Ht 6' (1.829 m)   Wt 209 lb 12.8 oz (95.2 kg)   SpO2 98%   BMI 28.45 kg/m  , BMI Body mass index is 28.45 kg/m. GEN: Well nourished, well developed, in no acute distress  HEENT: normal  Neck: no JVD, carotid bruits, or masses Cardiac: RRR; no murmurs, rubs, or gallops,no edema  Respiratory:  clear to auscultation bilaterally, normal work of breathing GI: soft, nontender, nondistended, + BS MS: no deformity or atrophy  Skin: warm and dry, no rash Neuro:  Strength and sensation are intact Psych: euthymic mood, full affect   EKG:   The ekg ordered today demonstrates    Recent Labs: 02/09/2021: NT-Pro BNP 792 02/12/2021: B Natriuretic Peptide 199.6; Magnesium 1.7 02/13/2021: BUN 22; Creatinine, Ser 1.26; Hemoglobin 14.3; Platelets 234; Potassium 4.1; Sodium 134   Lipid Panel No results found for: CHOL, TRIG, HDL, CHOLHDL, VLDL, LDLCALC, LDLDIRECT   Other studies Reviewed: Additional studies/ records that were reviewed today with results demonstrating: Monitor revealed 28 seconds of SVT with underlying bundle branch block..   ASSESSMENT AND PLAN:  1. Abnormal echo:  EF 45%.  Plan for R/L heart cath.  Left wrist preferable since he has a vein graft. 2. Anticoagulated: He will need to hold his Xarelto 2 days prior to the cardiac cath. 3. S/p AVR: SBE prophylaxis.  Echocardiogram showed normally functioning aortic valve. 4. PreSyncope- worse with walking.  Has not fully passed out. Concern for ischemia in the RCA territory.  Stay well-hydrated.  Cardiac catheterization was discussed with the patient fully. The patient understands that risks include but are not limited to stroke (1 in 1000), death (1 in 45), kidney failure [usually temporary] (1 in 500), bleeding (1 in 200), allergic reaction [possibly serious] (1 in 200).  The patient understands and is willing to proceed.      Current medicines are reviewed at length with the patient today.  The patient concerns regarding his medicines were addressed.  The following changes have been made:  No change  Labs/ tests ordered today include:  No orders of the defined types were placed in this encounter.   Recommend 150 minutes/week of aerobic exercise Low fat, low carb, high fiber diet recommended  Disposition:   FU for cath   Signed, Larae Grooms, MD  03/02/2021 2:36 PM    [facility] [place], [place], [place]  [address] Phone: [phone]; Fax: [phone]

## 2021-03-02 ENCOUNTER — Encounter: Payer: Self-pay | Admitting: Interventional Cardiology

## 2021-03-02 ENCOUNTER — Ambulatory Visit: Payer: Medicare Other | Admitting: Interventional Cardiology

## 2021-03-02 ENCOUNTER — Other Ambulatory Visit: Payer: Self-pay

## 2021-03-02 VITALS — BP 112/72 | HR 82 | Ht 72.0 in | Wt 209.8 lb

## 2021-03-02 DIAGNOSIS — I25119 Atherosclerotic heart disease of native coronary artery with unspecified angina pectoris: Secondary | ICD-10-CM

## 2021-03-02 DIAGNOSIS — Z952 Presence of prosthetic heart valve: Secondary | ICD-10-CM

## 2021-03-02 DIAGNOSIS — E1159 Type 2 diabetes mellitus with other circulatory complications: Secondary | ICD-10-CM

## 2021-03-02 DIAGNOSIS — E782 Mixed hyperlipidemia: Secondary | ICD-10-CM

## 2021-03-02 DIAGNOSIS — Z7901 Long term (current) use of anticoagulants: Secondary | ICD-10-CM

## 2021-03-02 NOTE — Patient Instructions (Signed)
Medication Instructions:  Your physician recommends that you continue on your current medications as directed. Please refer to the Current Medication list given to you today.  *If you need a refill on your cardiac medications before your next appointment, please call your pharmacy*   Lab Work: Lab work to be done today--BMP and CBC If you have labs (blood work) drawn today and your tests are completely normal, you will receive your results only by: Marland Kitchen MyChart Message (if you have MyChart) OR . A paper copy in the mail If you have any lab test that is abnormal or we need to change your treatment, we will call you to review the results.   Testing/Procedures: Your physician has requested that you have a cardiac catheterization. Cardiac catheterization is used to diagnose and/or treat various heart conditions. Doctors may recommend this procedure for a number of different reasons. The most common reason is to evaluate chest pain. Chest pain can be a symptom of coronary artery disease (CAD), and cardiac catheterization can show whether plaque is narrowing or blocking your heart's arteries. This procedure is also used to evaluate the valves, as well as measure the blood flow and oxygen levels in different parts of your heart. For further information please visit [email or web address]. Please follow instruction sheet, as given. Scheduled for May 26,2022     Follow-Up: At [facility], you and your health needs are our priority.  As part of our continuing mission to provide you with exceptional heart care, we have created designated Provider Care Teams.  These Care Teams include your primary Cardiologist (physician) and Advanced Practice Providers (APPs -  Physician Assistants and Nurse Practitioners) who all work together to provide you with the care you need, when you need it.  We recommend signing up for the patient portal called "MyChart".  Sign up information is provided on this After Visit  Summary.  MyChart is used to connect with patients for Virtual Visits (Telemedicine).  Patients are able to view lab/test results, encounter notes, upcoming appointments, etc.  Non-urgent messages can be sent to your provider as well.   To learn more about what you can do with MyChart, go to [email or web address].    Your next appointment:   To be arranged after procedure  The format for your next appointment:   In Person  Provider:   You may see Larae Grooms, MD or one of the following Advanced Practice Providers on your designated Care Team:    Melina Copa, PA-C  Ermalinda Barrios, PA-C    Other Instructions    [facility] OFFICE [place], [place] [address] Dept: [phone] Loc: [place]  03/02/2021  You are scheduled for a Cardiac Catheterization on Thursday, May 26 with Dr. Larae Grooms.  1. Please arrive at the [facility] (Main Entrance A) at [facility]: [address] [place], [place] [address] at 5:30 AM (This time is two hours before your procedure to ensure your preparation). Free valet parking service is available.   Special note: Every effort is made to have your procedure done on time. Please understand that emergencies sometimes delay scheduled procedures.  2. Diet: Do not eat solid foods after midnight.  The patient may have clear liquids until 5am upon the day of the procedure.  3. Labs: done in office on May 10,2022  4. Medication instructions in preparation for your procedure:   Contrast Allergy: No  Stop Xarelto after dose on May 23,2022. Do not take furosemide and potassium the morning of the procedure. Do not take any diabetes medication the morning of the procedure.  Do not take metformin for 48 hours after procedure. Do not take Cialis for 72 hours prior to procedure.     On the morning of your procedure, take  your Aspirin 81 mg and any morning medicines NOT listed above.  You may use sips of water.  5. Plan for one night stay--bring personal belongings. 6. Bring a current list of your medications and current insurance cards. 7. You MUST have a responsible person to drive you home. 8. Someone MUST be with you the first 24 hours after you arrive home or your discharge will be delayed. 9. Please wear clothes that are easy to get on and off and wear slip-on shoes.  Thank you for allowing [place] to care for you!   -- [facility]  Due to recent COVID-19 restrictions implemented by our local and state authorities and in an effort to keep both patients and staff as safe as possible, our hospital system requires COVID-19 testing prior to certain scheduled hospital procedures.  Please go to [place] [place], [place] [address] on May 24,2022 at 2:55 PM  .  This is a drive up testing site.  You will not need to exit your vehicle.  You will not be billed at the time of testing but may receive a bill later depending on your insurance. You must agree to self-quarantine from the time of your testing until the procedure date on Mar 18, 2021.  This should included staying home with ONLY the people you live with.  Avoid take-out, grocery store shopping or leaving the house for any non-emergent reason.  Failure to have your COVID-19 test done on the date and time you have been scheduled will result in cancellation of your procedure.  Please call our office at [phone] if you have any questions.

## 2021-03-03 LAB — CBC
Hematocrit: 43.7 % (ref 37.5–51.0)
Hemoglobin: 14.6 g/dL (ref 13.0–17.7)
MCH: 30.5 pg (ref 26.6–33.0)
MCHC: 33.4 g/dL (ref 31.5–35.7)
MCV: 91 fL (ref 79–97)
Platelets: 216 10*3/uL (ref 150–450)
RBC: 4.79 x10E6/uL (ref 4.14–5.80)
RDW: 13.9 % (ref 11.6–15.4)
WBC: 5.4 10*3/uL (ref 3.4–10.8)

## 2021-03-03 LAB — BASIC METABOLIC PANEL
BUN/Creatinine Ratio: 19 (ref 10–24)
BUN: 27 mg/dL (ref 8–27)
CO2: 21 mmol/L (ref 20–29)
Calcium: 9.6 mg/dL (ref 8.6–10.2)
Chloride: 101 mmol/L (ref 96–106)
Creatinine, Ser: 1.43 mg/dL — ABNORMAL HIGH (ref 0.76–1.27)
Glucose: 128 mg/dL — ABNORMAL HIGH (ref 65–99)
Potassium: 4.5 mmol/L (ref 3.5–5.2)
Sodium: 140 mmol/L (ref 134–144)
eGFR: 54 mL/min/{1.73_m2} — ABNORMAL LOW (ref >59)

## 2021-03-04 ENCOUNTER — Other Ambulatory Visit (HOSPITAL_COMMUNITY): Payer: Medicare Other

## 2021-03-08 ENCOUNTER — Telehealth: Payer: Self-pay

## 2021-03-08 NOTE — Telephone Encounter (Signed)
Left message for Pt.  Advised to call office IF He was not awake this AM 03/08/2021 at 12:30 am.  Left strips for JV to review.

## 2021-03-10 NOTE — Telephone Encounter (Signed)
Patient was returning call is asking that he get call back this morning if possible

## 2021-03-10 NOTE — Telephone Encounter (Signed)
The patient reports he was awake on 5/16 at [facility]. He does not remember what he was doing and he does not remember experiencing any symptoms at that time. He continues to have SOB that has not changed since seeing Dr. Irish Lack. Confirmed L/RHC 5/26. He understands he will be called next week to review cath instructions. He will call prior to that time if new symptoms occur.

## 2021-03-11 ENCOUNTER — Encounter: Payer: Self-pay | Admitting: Emergency Medicine

## 2021-03-11 ENCOUNTER — Telehealth: Payer: Self-pay

## 2021-03-11 ENCOUNTER — Other Ambulatory Visit: Payer: Self-pay

## 2021-03-11 ENCOUNTER — Ambulatory Visit: Payer: Medicare Other | Admitting: Emergency Medicine

## 2021-03-11 DIAGNOSIS — R0600 Dyspnea, unspecified: Secondary | ICD-10-CM

## 2021-03-11 DIAGNOSIS — R042 Hemoptysis: Secondary | ICD-10-CM | POA: Diagnosis not present

## 2021-03-11 DIAGNOSIS — R0609 Other forms of dyspnea: Secondary | ICD-10-CM

## 2021-03-11 NOTE — Patient Instructions (Signed)
Please call our office if you experience any more blood in the mucus.  If so then we will plan to proceed with a bronchoscopy to inspect your airways. Follow-up with cardiology and get your cardiac catheterization as planned. Depending on how your breathing is doing going forward and depending on your cardiac evaluation we may decide to perform pulmonary function testing at some point. Follow with Dr Lamonte Sakai in 3 months or sooner if you have any problems.

## 2021-03-11 NOTE — Telephone Encounter (Signed)
Preventice monitor alert for NSVT. Similar episodes in the last week. LVM with pt instructing him to call if he was symptomatic.   Per DOD, Dr. Lovena Le, continue to monitor.

## 2021-03-11 NOTE — Progress Notes (Signed)
Subjective:    Patient ID: Frank Berg, male    DOB: 11-26-1955, 65 y.o.   MRN: [id number]  HPI 65 year old gentleman, former smoker (2-3 pack years) with CAD/CABG, AVR (2011) and atrial fibrillation on anticoagulation and prophylactic doxycycline, diabetes, hyperlipidemia, GERD, OSA on CPAP.   He is referred today for evaluation of hemoptysis.  He reports he began to see some intermittent blood mixed with mucous about 3 months ago.  Seemed to be at times when his overall mucous burden was higher - sometimes yellow. Also seemed to be associated with exertion - after he played pickleball. It was happening every few days initially. He hasn't seen any blood in 2-3 weeks now. He was seen by ENT and no UA abnormality was seen. He does not recall any blood from his nose or sinuses. Remains on Xarelto. He is reliable w CPAP, doesn't always use humidity.  He has noticed some exertional SOB since about 2 months ago, used to play pickleball frequently, but his activity has dropped off since, seems that his breathing has been limiting him. There are plans for L and R heart cath next week.   He was hospitalized in April 2022 with near syncope.  Has also been found to have transaminitis, followed by gastroenterology  CT chest performed 12/14/2020 reviewed by me, showed no evidence of mass, no PE.  There was some bibasilar scar.  Scattered subcentimeter mediastinal lymph nodes that do not reach pathological criteria.  No evidence of endobronchial lesion.   Review of Systems As per HPI  Past Medical History:  Diagnosis Date  . Atrial fibrillation ([place])   . Coronary artery disease   . Diabetes mellitus without complication ([place])   . Diabetic polyneuropathy ([place])   . GERD (gastroesophageal reflux disease)   . Gout, unspecified   . HLD (hyperlipidemia)   . Hx of adenomatous polyp of colon 12/03/2020  . Hypertension   . OSA on CPAP   . Sleep apnea      Family History  Problem Relation Age of Onset   . Congenital heart disease Mother   . Diabetes Mother   . Hypertension Mother   . Heart disease Mother   . Heart attack Father   . Cervical cancer Sister   . Diabetes Sister   . Hypertension Brother   . Diabetes Brother   . Pancreatic cancer Brother   . Diabetes Brother   . Breast cancer Sister   . Diabetes Sister   . Diabetes Sister   . Diabetes Sister   . Diabetes Sister   . Diabetes Son   . Colon cancer Neg Hx   . Colon polyps Neg Hx   . Esophageal cancer Neg Hx   . Stomach cancer Neg Hx      Social History   Socioeconomic History  . Marital status: Married    Spouse name: Not on file  . Number of children: 2  . Years of education: Not on file  . Highest education level: Not on file  Occupational History  . Occupation: retired  Tobacco Use  . Smoking status: Former Smoker    Packs/day: 0.25    Years: 10.00    Pack years: 2.50    Quit date: 11/03/1981    Years since quitting: 39.3  . Smokeless tobacco: Never Used  Vaping Use  . Vaping Use: Never used  Substance and Sexual Activity  . Alcohol use: Yes    Comment: occasional  . Drug use: No  . Sexual  activity: Not on file  Other Topics Concern  . 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  No hx TB or exposure. Never a positive PPD.   Allergies  Allergen Reactions  . Lipitor [Atorvastatin] Other (See Comments)    Body aches  . Penicillins Swelling    Swollen face     Outpatient Medications Prior to Visit  Medication Sig Dispense Refill  . allopurinol (ZYLOPRIM) 300 MG tablet Take 300 mg by mouth daily.    . clindamycin (CLEOCIN T) 1 % external solution     . furosemide (LASIX) 20 MG tablet Take 20 mg by mouth daily.    . magnesium oxide (MAG-OX) 400 MG tablet Take 1 tablet by mouth daily.    . metFORMIN (GLUCOPHAGE) 500 MG tablet Take 500 mg by mouth 2 (two) times daily with a meal.    . Omega-3 Fatty Acids (FISH OIL) 1200 MG CPDR Take by mouth daily.    . potassium chloride (MICRO-K) 10 MEQ CR capsule Take 10 mEq by mouth 2 (two) times daily.    . rosuvastatin (CRESTOR) 5 MG tablet Take 5 mg by mouth every evening.    . tadalafil (CIALIS) 20 MG tablet Take 20 mg by mouth daily as needed.    Marland Kitchen telmisartan (MICARDIS) 40 MG tablet Take 40 mg by mouth daily.    Marland Kitchen VITAMIN D PO Take 1 capsule by mouth daily.    Alveda Reasons 20 MG TABS tablet Take 1 tablet (20 mg total) by mouth every evening. 90 tablet 3  . colchicine 0.6 MG tablet Take 0.6 mg by mouth 2 (two) times daily as needed (flare-ups). (Patient not taking: Reported on 03/11/2021)     No facility-administered medications prior to visit.        Objective:   Physical Exam Vitals:   03/11/21 1112  BP: 114/74  Pulse: 71  Temp: (!) 97 F (36.1 C)  TempSrc: Temporal  SpO2: 98%  Weight: 206 lb (93.4 kg)  Height: 6' 2" (1.88 m)   Gen: Pleasant, well-nourished, in no distress,  normal affect  ENT: No lesions,  mouth clear,  oropharynx clear, no postnasal drip  Neck: No JVD, no stridor  Lungs: No use of accessory muscles, no crackles or wheezing on normal respiration, no wheeze on forced  expiration  Cardiovascular: RRR, heart sounds normal, no murmur or gallops, no peripheral edema  Musculoskeletal: No deformities, no cyanosis or clubbing  Neuro: alert, awake, non focal  Skin: Warm, no lesions or rash      Assessment & Plan:  Hemoptysis Scant hemoptysis mixed  with mucus inpatient on anticoagulation.  The amount was small but it did persist over a few months.  He had a reassuring ENT evaluation and a reassuring CT scan of the chest.  He has not seen any blood for over 2 weeks, question whether this was self-limited and has resolved.  He will let me know if he sees a recurrence.  If so that I think he needs an airway inspection with bronchoscopy.  Discussed the rationale for this and he agrees.  DOE (dyspnea on exertion) Question etiology.  He is about to undergo repeat right and left heart catheterization to evaluate for possible cardiac contributors.  If the heart evaluation is reassuring then we should perform pulmonary function testing to look for an underlying cause for his dyspnea.   Baltazar Apo, MD, PhD 03/11/2021, 12:36 PM [facility] [phone] or if no answer before 7:00PM call [phone] For any issues after 7:00PM please call eLink [phone]

## 2021-03-11 NOTE — Assessment & Plan Note (Signed)
Question etiology.  He is about to undergo repeat right and left heart catheterization to evaluate for possible cardiac contributors.  If the heart evaluation is reassuring then we should perform pulmonary function testing to look for an underlying cause for his dyspnea.

## 2021-03-11 NOTE — Assessment & Plan Note (Signed)
Scant hemoptysis mixed with mucus inpatient on anticoagulation.  The amount was small but it did persist over a few months.  He had a reassuring ENT evaluation and a reassuring CT scan of the chest.  He has not seen any blood for over 2 weeks, question whether this was self-limited and has resolved.  He will let me know if he sees a recurrence.  If so that I think he needs an airway inspection with bronchoscopy.  Discussed the rationale for this and he agrees.

## 2021-03-15 ENCOUNTER — Telehealth: Payer: Self-pay

## 2021-03-15 NOTE — Telephone Encounter (Signed)
Spoke with patient in regards to Critical monitor reports.  Pt expresses that he can not recall feeling any funny heart beats, light headedness, or dizziness.  He does report SOB with ambulation.  DOD Dr. Johney Frame reviewed reports recommended that patient start metoprolol 12.5 mg PO BID.  Left a message requesting that pt call office. ( to review DOD recommendations)

## 2021-03-15 NOTE — Telephone Encounter (Signed)
Left a message for patient to call the office.  I was attempting to call patient in regards to Critical Heart Monitor Report.   Event #1  03/12/21 07:56:09 PM CST Day 23 of 30: SVT Event #2 03/14/21 02:43:08 PM CST Day 25 of 30: 17 beat run of  VT Event #3  03/14/21 11:51:31 PM CST Day 25 of 30: SR with PSVT Will take to DOD to review

## 2021-03-16 ENCOUNTER — Other Ambulatory Visit (HOSPITAL_COMMUNITY): Payer: Medicare Other

## 2021-03-16 ENCOUNTER — Telehealth: Payer: Self-pay | Admitting: *Deleted

## 2021-03-16 MED ORDER — METOPROLOL TARTRATE 25 MG PO TABS: ORAL_TABLET | ORAL | 0 refills | Status: DC

## 2021-03-16 NOTE — Telephone Encounter (Signed)
Partial text copied from 03/15/21 phone note (see 03/15/21 phone note for full text):  DOD Dr. Johney Frame reviewed reports recommended that patient start metoprolol 12.5 mg PO BID.  Left a message requesting that pt call office. ( to review DOD recommendations) _____ I spoke with patient and discussed Dr Jacolyn Reedy recommendation to start metoprolol 12.5 mg PO BID. Patient agreed, prescription sent to preferred pharmacy, patient knows to call office with any questions or concerns.

## 2021-03-16 NOTE — Telephone Encounter (Addendum)
Pt contacted pre-catheterization scheduled at [facility] for: Thursday Mar 18, 2021 7:30 AM Verified arrival time and place: [facility]) at: 5:30 AM   No solid food after midnight prior to cath, clear liquids until 5 AM day of procedure.  Hold: Xarelto-none 03/16/21 until post procedure Lasix/KCl-day before and day of procedure-GFR 54 Micardis-day before and day of procedure-GFR 54 Metformin-day of procedure and 48 hours post procedure Cialis-until post procedure  Except hold medications AM meds can be  taken pre-cath with sips of water including: ASA 81 mg   Confirmed patient has responsible adult to drive home post procedure and be with patient first 24 hours after arriving home: yes  You are allowed ONE visitor in the waiting room during the time you are at the hospital for your procedure. Both you and your visitor must wear a mask once you enter the hospital.   Patient reports does not currently have any symptoms concerning for COVID-19 and no household members with COVID-19 like illness.     Reviewed procedure/mask/visitor instructions with patient.

## 2021-03-18 ENCOUNTER — Ambulatory Visit (HOSPITAL_COMMUNITY)
Admission: RE | Admit: 2021-03-18 | Discharge: 2021-03-18 | Disposition: A | Discharge status: Home / Self Care | Payer: Medicare Other | Attending: Interventional Cardiology | Admitting: Interventional Cardiology

## 2021-03-18 ENCOUNTER — Encounter (HOSPITAL_COMMUNITY): Payer: Self-pay | Admitting: Interventional Cardiology

## 2021-03-18 ENCOUNTER — Ambulatory Visit (HOSPITAL_COMMUNITY)
Admission: RE | Disposition: A | Discharge status: Home / Self Care | Payer: Medicare Other | Source: Home / Self Care | Attending: Interventional Cardiology

## 2021-03-18 DIAGNOSIS — Z87891 Personal history of nicotine dependence: Secondary | ICD-10-CM | POA: Insufficient documentation

## 2021-03-18 DIAGNOSIS — Z880 Allergy status to penicillin: Secondary | ICD-10-CM | POA: Insufficient documentation

## 2021-03-18 DIAGNOSIS — Z951 Presence of aortocoronary bypass graft: Secondary | ICD-10-CM | POA: Insufficient documentation

## 2021-03-18 DIAGNOSIS — I5022 Chronic systolic (congestive) heart failure: Secondary | ICD-10-CM | POA: Insufficient documentation

## 2021-03-18 DIAGNOSIS — Z952 Presence of prosthetic heart valve: Secondary | ICD-10-CM | POA: Insufficient documentation

## 2021-03-18 DIAGNOSIS — Z79899 Other long term (current) drug therapy: Secondary | ICD-10-CM | POA: Diagnosis not present

## 2021-03-18 DIAGNOSIS — I2510 Atherosclerotic heart disease of native coronary artery without angina pectoris: Secondary | ICD-10-CM | POA: Diagnosis not present

## 2021-03-18 DIAGNOSIS — R55 Syncope and collapse: Secondary | ICD-10-CM | POA: Diagnosis not present

## 2021-03-18 DIAGNOSIS — Z7984 Long term (current) use of oral hypoglycemic drugs: Secondary | ICD-10-CM | POA: Insufficient documentation

## 2021-03-18 DIAGNOSIS — Z7901 Long term (current) use of anticoagulants: Secondary | ICD-10-CM | POA: Diagnosis not present

## 2021-03-18 DIAGNOSIS — I25119 Atherosclerotic heart disease of native coronary artery with unspecified angina pectoris: Secondary | ICD-10-CM | POA: Diagnosis present

## 2021-03-18 HISTORY — PX: RIGHT/LEFT HEART CATH AND CORONARY/GRAFT ANGIOGRAPHY: CATH118267

## 2021-03-18 LAB — POCT I-STAT EG7
Acid-base deficit: 1 mmol/L (ref 0.0–2.0)
Acid-base deficit: 1 mmol/L (ref 0.0–2.0)
Bicarbonate: 24.7 mmol/L (ref 20.0–28.0)
Bicarbonate: 24.9 mmol/L (ref 20.0–28.0)
Calcium, Ion: 1.27 mmol/L (ref 1.15–1.40)
Calcium, Ion: 1.27 mmol/L (ref 1.15–1.40)
HCT: 42 % (ref 39.0–52.0)
HCT: 43 % (ref 39.0–52.0)
Hemoglobin: 14.3 g/dL (ref 13.0–17.0)
Hemoglobin: 14.6 g/dL (ref 13.0–17.0)
O2 Saturation: 56 %
O2 Saturation: 60 %
Potassium: 4.1 mmol/L (ref 3.5–5.1)
Potassium: 4.1 mmol/L (ref 3.5–5.1)
Sodium: 141 mmol/L (ref 135–145)
Sodium: 141 mmol/L (ref 135–145)
TCO2: 26 mmol/L (ref 22–32)
TCO2: 26 mmol/L (ref 22–32)
pCO2, Ven: 45.7 mmHg (ref 44.0–60.0)
pCO2, Ven: 45.8 mmHg (ref 44.0–60.0)
pH, Ven: 7.34 (ref 7.250–7.430)
pH, Ven: 7.343 (ref 7.250–7.430)
pO2, Ven: 31 mmHg — CL (ref 32.0–45.0)
pO2, Ven: 33 mmHg (ref 32.0–45.0)

## 2021-03-18 LAB — POCT I-STAT 7, (LYTES, BLD GAS, ICA,H+H)
Acid-base deficit: 1 mmol/L (ref 0.0–2.0)
Bicarbonate: 23.2 mmol/L (ref 20.0–28.0)
Calcium, Ion: 1.27 mmol/L (ref 1.15–1.40)
HCT: 43 % (ref 39.0–52.0)
Hemoglobin: 14.6 g/dL (ref 13.0–17.0)
O2 Saturation: 99 %
Potassium: 4 mmol/L (ref 3.5–5.1)
Sodium: 140 mmol/L (ref 135–145)
TCO2: 24 mmol/L (ref 22–32)
pCO2 arterial: 38.2 mmHg (ref 32.0–48.0)
pH, Arterial: 7.393 (ref 7.350–7.450)
pO2, Arterial: 153 mmHg — ABNORMAL HIGH (ref 83.0–108.0)

## 2021-03-18 LAB — GLUCOSE, CAPILLARY
Glucose-Capillary: 106 mg/dL — ABNORMAL HIGH (ref 70–99)
Glucose-Capillary: 97 mg/dL (ref 70–99)

## 2021-03-18 SURGERY — RIGHT/LEFT HEART CATH AND CORONARY/GRAFT ANGIOGRAPHY
Anesthesia: LOCAL

## 2021-03-18 MED ORDER — HYDRALAZINE HCL 20 MG/ML IJ SOLN: 10.0000 mg | INTRAMUSCULAR | Status: DC | PRN

## 2021-03-18 MED ORDER — IOHEXOL 350 MG/ML SOLN
INTRAVENOUS | Status: DC | PRN
  Administered 2021-03-18: 45 mL via INTRA_ARTERIAL

## 2021-03-18 MED ORDER — SODIUM CHLORIDE 0.9% FLUSH: 3.0000 mL | Freq: Two times a day (BID) | INTRAVENOUS | Status: DC

## 2021-03-18 MED ORDER — FENTANYL CITRATE (PF) 100 MCG/2ML IJ SOLN
INTRAMUSCULAR | Status: DC | PRN
  Administered 2021-03-18: 25 ug via INTRAVENOUS

## 2021-03-18 MED ORDER — HEPARIN (PORCINE) IN NACL 1000-0.9 UT/500ML-% IV SOLN
INTRAVENOUS | Status: DC | PRN
  Administered 2021-03-18 (×2): 500 mL

## 2021-03-18 MED ORDER — METFORMIN HCL 500 MG PO TABS: 500.0000 mg | ORAL_TABLET | Freq: Two times a day (BID) | ORAL | Status: DC

## 2021-03-18 MED ORDER — ONDANSETRON HCL 4 MG/2ML IJ SOLN: 4.0000 mg | Freq: Four times a day (QID) | INTRAMUSCULAR | Status: DC | PRN

## 2021-03-18 MED ORDER — SODIUM CHLORIDE 0.9 % IV SOLN: INTRAVENOUS | Status: AC

## 2021-03-18 MED ORDER — LIDOCAINE HCL (PF) 1 % IJ SOLN
INTRAMUSCULAR | Status: DC | PRN
  Administered 2021-03-18: 18 mL via INTRADERMAL

## 2021-03-18 MED ORDER — SODIUM CHLORIDE 0.9% FLUSH: 3.0000 mL | INTRAVENOUS | Status: DC | PRN

## 2021-03-18 MED ORDER — FENTANYL CITRATE (PF) 100 MCG/2ML IJ SOLN
INTRAMUSCULAR | Status: AC
  Filled 2021-03-18: qty 2

## 2021-03-18 MED ORDER — SODIUM CHLORIDE 0.9 % WEIGHT BASED INFUSION: 1.0000 mL/kg/h | INTRAVENOUS | Status: DC

## 2021-03-18 MED ORDER — ACETAMINOPHEN 325 MG PO TABS: 650.0000 mg | ORAL_TABLET | ORAL | Status: DC | PRN

## 2021-03-18 MED ORDER — SODIUM CHLORIDE 0.9 % IV SOLN: 250.0000 mL | INTRAVENOUS | Status: DC | PRN

## 2021-03-18 MED ORDER — SODIUM CHLORIDE 0.9 % WEIGHT BASED INFUSION
3.0000 mL/kg/h | INTRAVENOUS | Status: AC
  Administered 2021-03-18: 3 mL/kg/h via INTRAVENOUS

## 2021-03-18 MED ORDER — ASPIRIN 81 MG PO CHEW
81.0000 mg | CHEWABLE_TABLET | ORAL | Status: AC
  Administered 2021-03-18: 81 mg via ORAL
  Filled 2021-03-18: qty 1

## 2021-03-18 MED ORDER — MIDAZOLAM HCL 2 MG/2ML IJ SOLN
INTRAMUSCULAR | Status: DC | PRN
  Administered 2021-03-18: 1 mg via INTRAVENOUS

## 2021-03-18 MED ORDER — MIDAZOLAM HCL 2 MG/2ML IJ SOLN
INTRAMUSCULAR | Status: AC
  Filled 2021-03-18: qty 2

## 2021-03-18 MED ORDER — LABETALOL HCL 5 MG/ML IV SOLN: 10.0000 mg | INTRAVENOUS | Status: DC | PRN

## 2021-03-18 MED ORDER — VERAPAMIL HCL 2.5 MG/ML IV SOLN
INTRAVENOUS | Status: AC
  Filled 2021-03-18: qty 2

## 2021-03-18 MED ORDER — HEPARIN (PORCINE) IN NACL 1000-0.9 UT/500ML-% IV SOLN
INTRAVENOUS | Status: AC
  Filled 2021-03-18: qty 1000

## 2021-03-18 MED ORDER — XARELTO 20 MG PO TABS: 20.0000 mg | ORAL_TABLET | Freq: Every evening | ORAL | 3 refills | Status: DC

## 2021-03-18 MED ORDER — LIDOCAINE HCL (PF) 1 % IJ SOLN
INTRAMUSCULAR | Status: AC
  Filled 2021-03-18: qty 30

## 2021-03-18 SURGICAL SUPPLY — 15 items
CATH INFINITI 5 FR IM (CATHETERS) ×2 IMPLANT
CATH INFINITI 5FR MULTPACK ANG (CATHETERS) ×2 IMPLANT
CATH SWAN GANZ 7F STRAIGHT (CATHETERS) ×2 IMPLANT
GLIDESHEATH SLEND SS 6F .021 (SHEATH) ×2 IMPLANT
GUIDEWIRE INQWIRE 1.5J.035X260 (WIRE) ×1 IMPLANT
INQWIRE 1.5J .035X260CM (WIRE) ×2
KIT HEART LEFT (KITS) ×2 IMPLANT
PACK CARDIAC CATHETERIZATION (CUSTOM PROCEDURE TRAY) ×2 IMPLANT
SHEATH GLIDE SLENDER 4/5FR (SHEATH) ×2 IMPLANT
SHEATH PINNACLE 5F 10CM (SHEATH) ×2 IMPLANT
SHEATH PINNACLE 7F 10CM (SHEATH) ×2 IMPLANT
SHEATH PROBE COVER 6X72 (BAG) ×2 IMPLANT
TRANSDUCER W/STOPCOCK (MISCELLANEOUS) ×2 IMPLANT
TUBING CIL FLEX 10 FLL-RA (TUBING) ×2 IMPLANT
WIRE EMERALD 3MM-J .035X150CM (WIRE) ×2 IMPLANT

## 2021-03-18 NOTE — Progress Notes (Signed)
SITE AREA: right femoral/groin  SITE PRIOR TO REMOVAL:  LEVEL 0  PRESSURE APPLIED FOR: approximately 20 minutes for arterial sheath and approximately 10 minutes for veinous sheath  MANUAL: yes  PATIENT STATUS DURING PULL: stable, resting with eyes closed  POST PULL SITE:  LEVEL 0  POST PULL INSTRUCTIONS GIVEN: yes  POST PULL PULSES PRESENT: pedal pulses at +2, palpable  DRESSING APPLIED: gauze with tegaderm  BEDREST BEGINS @ 0925   COMMENTS:

## 2021-03-18 NOTE — Discharge Instructions (Signed)
Femoral Site Care  This sheet gives you information about how to care for yourself after your procedure. Your health care provider may also give you more specific instructions. If you have problems or questions, contact your health care provider. What can I expect after the procedure? After the procedure, it is common to have:  Bruising that usually fades within 1-2 weeks.  Tenderness at the site. Follow these instructions at home: Wound care  Follow instructions from your health care provider about how to take care of your insertion site. Make sure you: ? Wash your hands with soap and water before you change your bandage (dressing). If soap and water are not available, use hand sanitizer. ? Change your dressing as told by your health care provider. ? Leave stitches (sutures), skin glue, or adhesive strips in place. These skin closures may need to stay in place for 2 weeks or longer. If adhesive strip edges start to loosen and curl up, you may trim the loose edges. Do not remove adhesive strips completely unless your health care provider tells you to do that.  Do not take baths, swim, or use a hot tub until your health care provider approves.  You may shower 24-48 hours after the procedure or as told by your health care provider. ? Gently wash the site with plain soap and water. ? Pat the area dry with a clean towel. ? Do not rub the site. This may cause bleeding.  Do not apply powder or lotion to the site. Keep the site clean and dry.  Check your femoral site every day for signs of infection. Check for: ? Redness, swelling, or pain. ? Fluid or blood. ? Warmth. ? Pus or a bad smell. Activity  For the first 2-3 days after your procedure, or as long as directed: ? Avoid climbing stairs as much as possible. ? Do not squat.  Do not lift anything that is heavier than 10 lb (4.5 kg), or the limit that you are told, until your health care provider says that it is safe.  Rest as  directed. ? Avoid sitting for a long time without moving. Get up to take short walks every 1-2 hours.  Do not drive for 24 hours if you were given a medicine to help you relax (sedative). General instructions  Take over-the-counter and prescription medicines only as told by your health care provider.  Keep all follow-up visits as told by your health care provider. This is important. Contact a health care provider if you have:  A fever or chills.  You have redness, swelling, or pain around your insertion site. Get help right away if:  The catheter insertion area swells very fast.  You pass out.  You suddenly start to sweat or your skin gets clammy.  The catheter insertion area is bleeding, and the bleeding does not stop when you hold steady pressure on the area.  The area near or just beyond the catheter insertion site becomes pale, cool, tingly, or numb. These symptoms may represent a serious problem that is an emergency. Do not wait to see if the symptoms will go away. Get medical help right away. Call your local emergency services (911 in the [place] Do not drive yourself to the hospital. Summary  After the procedure, it is common to have bruising that usually fades within 1-2 weeks.  Check your femoral site every day for signs of infection.  Do not lift anything that is heavier than 10 lb (4.5 kg), or   the limit that you are told, until your health care provider says that it is safe. This information is not intended to replace advice given to you by your health care provider. Make sure you discuss any questions you have with your health care provider. Document Revised: 06/12/2020 Document Reviewed: 06/12/2020 Elsevier Patient Education  2021 Elsevier Inc.  

## 2021-03-18 NOTE — Interval H&P Note (Signed)
Cath Lab Visit (complete for each Cath Lab visit)  Clinical Evaluation Leading to the Procedure:   ACS: No.  Non-ACS:    Anginal Classification: CCS III  Anti-ischemic medical therapy: Minimal Therapy (1 class of medications)  Non-Invasive Test Results: Intermediate-risk stress test findings: cardiac mortality 1-3%/year  Prior CABG: single vessel CABG      History and Physical Interval Note:  03/18/2021 7:38 AM  Frank Berg  has presented today for surgery, with the diagnosis of abnormal echo.  The various methods of treatment have been discussed with the patient and family. After consideration of risks, benefits and other options for treatment, the patient has consented to  Procedure(s): RIGHT/LEFT HEART CATH AND CORONARY/GRAFT ANGIOGRAPHY (N/A) as a surgical intervention.  The patient's history has been reviewed, patient examined, no change in status, stable for surgery.  I have reviewed the patient's chart and labs.  Questions were answered to the patient's satisfaction.     Larae Grooms

## 2021-03-19 ENCOUNTER — Telehealth: Payer: Self-pay

## 2021-03-19 MED FILL — Verapamil HCl IV Soln 2.5 MG/ML: INTRAVENOUS | Qty: 2 | Status: AC

## 2021-03-19 NOTE — Telephone Encounter (Signed)
Additional cardiac strips demonstrating tachycardia.  Similar to previously received monitor reports.  Pt s/p cardiac catheterization 03/18/2021.  Will place strips for Dr. Hassell Done review.  Per review of cath results-referral to advanced heart failure was recommended per Dr. Irish Lack.  No referral entered.

## 2021-03-23 ENCOUNTER — Telehealth: Payer: Self-pay | Admitting: *Deleted

## 2021-03-23 MED ORDER — BISOPROLOL FUMARATE 5 MG PO TABS: 2.5000 mg | ORAL_TABLET | Freq: Every day | ORAL | 11 refills | Status: DC

## 2021-03-23 NOTE — Telephone Encounter (Signed)
[facility] day 30   03/19/21 at 11:30 PM; auto trigger  SVT 17.3 seconds, sinus rhythm with 1 degree AV block Heart rate 165 - 74.  Spoke to patient states she was probably awake because he normally stays up until 2 am, however he does not remember having any symptoms. When asked about metoprolol tartrate 12.5 that was started for this on 5/24 the patient states he only took one dose and it made him feel fatigued and unwell. Example: had to stop and rest while climbing the stairs in his home. He also had to take breaks many times through out the day, which is not normal for him. Patient stated he planned on trying the medication again later today to see if he felt bad again. Will forward to Dr. Irish Lack and his nurse for advisement and follow up on S&S related to medication.

## 2021-03-23 NOTE — Telephone Encounter (Signed)
Spoke with Dr. Irish Lack We will try switching patient to bisoprolol 2.5mg  daily. I do suspect that part of the reason patient felt poorly is his elevated heart rate but we will try switching to bisoprolol as well. Patient appreciative of the call. Scheduled patient in the PharmD clinic for follow up and possible CHF med titration on 6/20.  He will buy a pill spilter today to help with splitting the tablets.

## 2021-03-23 NOTE — Addendum Note (Signed)
Addended by: Marcelle Overlie D on: 03/23/2021 01:49 PM   Modules accepted: Orders

## 2021-03-25 ENCOUNTER — Telehealth: Payer: Self-pay | Admitting: Adult Health

## 2021-03-25 NOTE — Telephone Encounter (Signed)
Contact pt DME [employer] prior to giving him a call. [employer] stated he just received his machine 07/11/2019, it is only 67 yrs old and he is not eligible to receive a new machine. I contacted pt to inform him of this, advised him that if he is having issues or trouble with the machine then he will need to contact them for further assistance. He understood and will reach out to them.

## 2021-03-25 NOTE — Telephone Encounter (Signed)
At 1:10 this afternoon pt left a vm asking for a call back to discuss how to go about getting a new CPAP

## 2021-03-26 ENCOUNTER — Telehealth: Payer: Self-pay | Admitting: Interventional Cardiology

## 2021-03-26 NOTE — Telephone Encounter (Signed)
The patient has taken the medication for the past 3 days and has not taken it yet today. After further discussion found that patient was taking the medication two times a day and not daily. Patient thought it was the same frequency as the metoprolol that it replaced. After education patient will take the bisoprolol 2.5 mg daily over the weekend with the hope that symptoms will subside. Will call office on Monday if he continues to feel bad. Gave ED precautions. Patient verbalized understanding.

## 2021-03-26 NOTE — Telephone Encounter (Signed)
Patient is returning call.  °

## 2021-03-26 NOTE — Telephone Encounter (Signed)
Pt c/o medication issue:  1. Name of Medication:   bisoprolol (ZEBETA) 5 MG tablet     2. How are you currently taking this medication (dosage and times per day)? 0.5 tablets (2.5 mg total) by mouth daily  3. Are you having a reaction (difficulty breathing--STAT)?  No  4. What is your medication issue?  Patient states since beginning this medication on 03/23/21 he has been experiencing episodes of SOB several times a day. He states the episodes feel as if he is having anxiety attacks and he feels like he is about to lose consciousness. He states this only occurs when he is standing or walking around and he has to pause for a moment to avoid going unconscious. He states he has minimal dizziness during these episodes and his main concern is the SOB. He states the medication is also causing frequent urination.   Pt c/o Shortness Of Breath: STAT if SOB developed within the last 24 hours or pt is noticeably SOB on the phone  1. Are you currently SOB (can you hear that pt is SOB on the phone)?  No   2. How long have you been experiencing SOB? Since starting this medication on 03/23/21  3. Are you SOB when sitting or when up moving around?  When up and moving around 4. Are you currently experiencing any other symptoms?  No

## 2021-03-26 NOTE — Telephone Encounter (Signed)
Attempted phone call to pt and left voicemail message to contact triage at [phone]. 

## 2021-03-31 NOTE — Progress Notes (Signed)
PCP:  [name], Berg Primary Cardiologist: Frank Berg Electrophysiologist: Previously seen by Frank. Rayann Berg for evaluation of atrial arrhythmias in 08/2018  He has a h/o aortic valve  surgery with single vessel CABG in 2011.  He had afib subsequently and underwent afib ablation (Cryoballoon procedure at Frank Berg by Frank Berg 10/07/2016.  He has since moved to [place] from [place].  He has been followed by Frank Berg.   He is on Xarelto for CHA2DS2VASC of at least 5.    He has largely been unaware of afib. Recent monitor did not show AF  Frank Berg is a 65 y.o. male seen today for Frank Berg for acute visit due to abnormal cardiac monitor .  Since last being seen in our clinic the patient reports doing about the same since last visit with Frank. Irish Berg.  He does not notice rapid heart rhythms. Mostly limited by fatigue and decreased exercise tolerance. This has been ongoing for 3-4 months. Interestingly, this was preceded by approximately 1 month of phlegm production and hemoptysis. Prior to this he was playing "pickle ball" and line-dancing without difficulty. He has been seen by pulmonology and work up thus far as been unrevealing. Have mentioned bronchoscopy if recurs.He has been started on beta blocker with mild fatigue, but overall tolerating now. .No syncope or near syncope.   Past Medical History:  Diagnosis Date   Atrial fibrillation ([place])    Coronary artery disease    Diabetes mellitus without complication (HCC)    Diabetic polyneuropathy (HCC)    GERD (gastroesophageal reflux disease)    Gout, unspecified    HLD (hyperlipidemia)    Hx of adenomatous polyp of colon 12/03/2020   Hypertension    OSA on CPAP    Sleep apnea    Past Surgical History:  Procedure Laterality Date   ABLATION     AORTIC VALVE REPLACEMENT  2011   CERVICAL DISC SURGERY     spinal stenosis   COLONOSCOPY     ESOPHAGOGASTRODUODENOSCOPY     RIGHT/LEFT HEART CATH AND  CORONARY/GRAFT ANGIOGRAPHY N/A 03/18/2021   Procedure: RIGHT/LEFT HEART CATH AND CORONARY/GRAFT ANGIOGRAPHY;  Surgeon: Jettie Booze, Berg;  Location: [facility];  Service: Cardiovascular;  Laterality: N/A;   TONSILLECTOMY      Current Outpatient Medications  Medication Sig Dispense Refill   allopurinol (ZYLOPRIM) 300 MG tablet Take 300 mg by mouth daily.     bisoprolol (ZEBETA) 5 MG tablet Take 0.5 tablets (2.5 mg total) by mouth daily. 15 tablet 11   furosemide (LASIX) 20 MG tablet Take 20 mg by mouth daily.     magnesium oxide (MAG-OX) 400 MG tablet Take 400 mg by mouth daily.     metFORMIN (GLUCOPHAGE) 500 MG tablet Take 1 tablet (500 mg total) by mouth 2 (two) times daily with a meal.     Omega-3 Fatty Acids (FISH OIL) 1200 MG CPDR Take 1,200 mg by mouth daily.     potassium chloride (MICRO-K) 10 MEQ CR capsule Take 20 mEq by mouth daily.     rosuvastatin (CRESTOR) 5 MG tablet Take 5 mg by mouth every evening.     telmisartan (MICARDIS) 40 MG tablet Take 40 mg by mouth daily.     VITAMIN D PO Take 1 capsule by mouth daily.     XARELTO 20 MG TABS tablet Take 1 tablet (20 mg total) by mouth every evening. 90 tablet 3   No current facility-administered medications for this visit.  Allergies  Allergen Reactions   Lipitor [Atorvastatin] Other (See Comments)    Body aches   Penicillins Swelling    Swollen face    Social History   Socioeconomic History   Marital status: Married    Spouse name: Not on file   Number of children: 2   Years of education: Not on file   Highest education level: Not on file  Occupational History   Occupation: retired  Tobacco Use   Smoking status: Former    Packs/day: 0.25    Years: 10.00    Pack years: 2.50    Types: Cigarettes    Quit date: 11/03/1981    Years since quitting: 39.4   Smokeless tobacco: Never  Vaping Use   Vaping Use: Never used  Substance and Sexual Activity   Alcohol use: Yes    Comment: occasional   Drug  use: No   Sexual activity: Not on file  Other Topics Concern   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 to 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 2017   Married 1 son 1 daughter   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   Other jobs including working at the [employer], providing health care for disabled, 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, 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   Social Determinants of Radio broadcast assistant Strain: Not on file  Food Insecurity: Not on file  Transportation Needs: Not on file  Physical Activity: Not on file  Stress: Not on file  Social Connections: Not on file  Intimate Partner Violence: Not on file     Review of Systems: General: No chills, fever, night sweats or weight changes  Cardiovascular:  No chest pain, dyspnea on exertion, edema, orthopnea, palpitations, paroxysmal nocturnal dyspnea Dermatological: No rash, lesions or masses Respiratory: No cough, dyspnea Urologic: No hematuria, dysuria Abdominal: No nausea, vomiting, diarrhea, bright red blood per rectum, melena, or hematemesis Neurologic: No visual changes, weakness, changes in mental status All other systems reviewed and are otherwise negative except as noted above.  Physical Exam: Vitals:   04/01/21 0857  BP: 98/64  Pulse: 100  SpO2: 100%  Weight: 210 lb (95.3 kg)  Height: 6' 2" (1.88 m)    GEN- The patient is well appearing, alert and oriented x 3 today.   HEENT: normocephalic, atraumatic; sclera clear, conjunctiva pink; hearing intact; oropharynx clear; neck supple, no JVP Lymph- no cervical lymphadenopathy Lungs- Clear to ausculation bilaterally, normal work of breathing.  No wheezes, rales, rhonchi Heart- Regular rate and rhythm, no murmurs, rubs or gallops, PMI not laterally displaced GI- soft, non-tender,  non-distended, bowel sounds present, no hepatosplenomegaly Extremities- no clubbing, cyanosis, or edema; DP/PT/radial pulses 2+ bilaterally MS- no significant deformity or atrophy Skin- warm and dry, no rash or lesion Psych- euthymic mood, full affect Neuro- strength and sensation are intact  EKG is not ordered. Personal review of EKG from  03/18/2021  shows NSR at 69 bpm with QRS 136 ms in RBBB pattern  Additional studies reviewed include: Previous EP office notes Previous [facility] notes  [facility] 03/18/2021 Prox LAD lesion is 50% stenosed. Mid LAD lesion is 90% stenosed. Patent LIMA to LAD. Circumflex and RCA appear patent with mild disease. Ao sat 99%, PA sat 56% (repeat value was 60%); PA pressure 15 mm  Hg; mean PCWP 5 mm Hg; CO 3.4 L/min; CI 1.6   Medical therapy for LV dysfunction.  Will consider referral to CHF clinic. To optimize his meds.  BP was somewhat low today, 97/60, which limits titration of his meds.  Event Monitor 02/2021 NSR with occasional SVT and NSVT Longest NSVT 18 beats Longest SVT > 30 seconds As many as > 50 PVCs in a minute Maximum HR 157 Minimum HR 59 (0512 am) No significant pauses. Occasional short post PVC pause  Frequent PACs and PVCs  Assessment and Plan:  1. Frequent ectopy EF 45% SVT, NSVT, and PVCs noted Now on BB. He has not noticed significant change in his symptoms, which is primarily decreased exercise tolerance.  He has not had near syncope since episode about a month ago. No frank syncope. Discussed with Frank. Rayann Berg and at this time agrees that best treatment is to see how he tolerates BB and titrate as tolerated.  ? If eventually may need to consider amiodarone. Worry about baseline bradycardia now on BB. May also not be ideal if underlying process ends up being more pulmonary driven.   2. Chronic systolic CHF Echo 04/4127 LVEF 45%, Mildly reduced RV, Grade 2 DD, Mild/mod LAE, Mod RAE, mild MR Cardiac output borderline low on RHC 5/26 with  soft pressures.  ? If would benefit from cMRI given though ? If uptake would be confounded by h/o CABG and AVR.   3. S/p AVR Stable by echo 01/2021  4. Decreased exercise tolerance Multifactorial but concerning for underlying pulmonary process as this was preceded by an unclear upper respiratory illness with phlegm and hemoptysis.  Chest CT with "scarring in each lower lobe" but no other airspace disease. ? If he would benefit from PFTs to further evaluate underlying lung function.   Shirley Friar, PA-C  04/01/21 9:12 AM

## 2021-04-01 ENCOUNTER — Other Ambulatory Visit: Payer: Self-pay

## 2021-04-01 ENCOUNTER — Encounter: Payer: Self-pay | Admitting: Student

## 2021-04-01 ENCOUNTER — Ambulatory Visit (INDEPENDENT_AMBULATORY_CARE_PROVIDER_SITE_OTHER): Payer: Medicare Other | Admitting: Student

## 2021-04-01 VITALS — BP 98/64 | HR 100 | Ht 74.0 in | Wt 210.0 lb

## 2021-04-01 DIAGNOSIS — R002 Palpitations: Secondary | ICD-10-CM

## 2021-04-01 DIAGNOSIS — I480 Paroxysmal atrial fibrillation: Secondary | ICD-10-CM

## 2021-04-01 DIAGNOSIS — Z952 Presence of prosthetic heart valve: Secondary | ICD-10-CM | POA: Diagnosis not present

## 2021-04-01 DIAGNOSIS — I25119 Atherosclerotic heart disease of native coronary artery with unspecified angina pectoris: Secondary | ICD-10-CM | POA: Diagnosis not present

## 2021-04-01 DIAGNOSIS — R0602 Shortness of breath: Secondary | ICD-10-CM

## 2021-04-01 NOTE — Patient Instructions (Signed)
Medication Instructions:  Your physician recommends that you continue on your current medications as directed. Please refer to the Current Medication list given to you today.  *If you need a refill on your cardiac medications before your next appointment, please call your pharmacy*   Lab Work: TODAY: CMET, CBC, TSH  If you have labs (blood work) drawn today and your tests are completely normal, you will receive your results only by: [place] (if you have MyChart) OR A paper copy in the mail If you have any lab test that is abnormal or we need to change your treatment, we will call you to review the results.   Follow-Up: At [facility], you and your health needs are our priority.  As part of our continuing mission to provide you with exceptional heart care, we have created designated Provider Care Teams.  These Care Teams include your primary Cardiologist (physician) and Advanced Practice Providers (APPs -  Physician Assistants and Nurse Practitioners) who all work together to provide you with the care you need, when you need it.   Your next appointment:   As scheduled

## 2021-04-02 ENCOUNTER — Telehealth: Payer: Self-pay

## 2021-04-02 ENCOUNTER — Other Ambulatory Visit: Payer: Medicare Other | Admitting: *Deleted

## 2021-04-02 DIAGNOSIS — R7989 Other specified abnormal findings of blood chemistry: Secondary | ICD-10-CM

## 2021-04-02 DIAGNOSIS — R002 Palpitations: Secondary | ICD-10-CM

## 2021-04-02 DIAGNOSIS — R55 Syncope and collapse: Secondary | ICD-10-CM

## 2021-04-02 LAB — COMPREHENSIVE METABOLIC PANEL
ALT: 16 IU/L (ref 0–44)
AST: 45 IU/L — ABNORMAL HIGH (ref 0–40)
Albumin/Globulin Ratio: 1.7 (ref 1.2–2.2)
Albumin: 4.1 g/dL (ref 3.8–4.8)
Alkaline Phosphatase: 102 IU/L (ref 44–121)
BUN/Creatinine Ratio: 18 (ref 10–24)
BUN: 24 mg/dL (ref 8–27)
Bilirubin Total: 0.5 mg/dL (ref 0.0–1.2)
CO2: 21 mmol/L (ref 20–29)
Calcium: 9.3 mg/dL (ref 8.6–10.2)
Chloride: 104 mmol/L (ref 96–106)
Creatinine, Ser: 1.33 mg/dL — ABNORMAL HIGH (ref 0.76–1.27)
Globulin, Total: 2.4 g/dL (ref 1.5–4.5)
Glucose: 106 mg/dL — ABNORMAL HIGH (ref 65–99)
Potassium: 4.6 mmol/L (ref 3.5–5.2)
Sodium: 139 mmol/L (ref 134–144)
Total Protein: 6.5 g/dL (ref 6.0–8.5)
eGFR: 59 mL/min/{1.73_m2} — ABNORMAL LOW (ref >59)

## 2021-04-02 LAB — TSH: TSH: 6.83 u[IU]/mL — ABNORMAL HIGH (ref 0.450–4.500)

## 2021-04-02 LAB — CBC
Hematocrit: 47.5 % (ref 37.5–51.0)
Hemoglobin: 15.3 g/dL (ref 13.0–17.7)
MCH: 29.7 pg (ref 26.6–33.0)
MCHC: 32.2 g/dL (ref 31.5–35.7)
MCV: 92 fL (ref 79–97)
Platelets: 246 10*3/uL (ref 150–450)
RBC: 5.15 x10E6/uL (ref 4.14–5.80)
RDW: 13.9 % (ref 11.6–15.4)
WBC: 5.4 10*3/uL (ref 3.4–10.8)

## 2021-04-02 NOTE — Telephone Encounter (Signed)
The patient has been notified of the result and verbalized understanding.  All questions (if any) were answered. Antonieta Iba, RN 04/02/2021 2:02 PM  Patient scheduled for repeat TSH with T3 and T4

## 2021-04-02 NOTE — Telephone Encounter (Signed)
Called and spoke with patient. I was able to get him scheduled for a PFT on 07/21 at 3pm and OV with RB at 415 on the same day. He is aware that he will need a COVID test before the PFT. He will go to the [facility] for a PCR test. He is aware to bring the test results with him the day of the PFT.   He is aware to call our office with any concerns.   Nothing further needed at time of call.

## 2021-04-02 NOTE — Telephone Encounter (Signed)
-----   Message from Shirley Friar, PA-C sent at 04/02/2021  1:10 PM EDT ----- BMET baseline.   TSH elevated.  Please recheck TSH and add Free T3, Free T4.   Legrand Como [address] [place], PA-C  04/02/2021 1:10 PM

## 2021-04-02 NOTE — Telephone Encounter (Signed)
-----   Message from Collene Gobble, MD sent at 04/02/2021  1:53 PM EDT ----- Regarding: needs OV w PFT [name] - can we get him into be seen w PFT on the same day? Thanks.  Dr B   Thanks we will work on setting up.  [name]    ----- Message ----- From: Shirley Friar, Hershal Coria Sent: 04/02/2021   1:15 PM EDT To: Collene Gobble, MD   Cardiac work up has not been especially revealing in the source of his dyspnea.   You had mentioned considering PFTs and I think they would be helpful. I will gladly order unless you want to arrange through your office.   Thank you!  Frank Berg [address]" [place], PA-C  04/02/2021 1:15 PM

## 2021-04-03 LAB — T4, FREE: Free T4: 0.99 ng/dL (ref 0.82–1.77)

## 2021-04-03 LAB — T3, FREE: T3, Free: 2.1 pg/mL (ref 2.0–4.4)

## 2021-04-03 LAB — TSH: TSH: 3.75 u[IU]/mL (ref 0.450–4.500)

## 2021-04-06 ENCOUNTER — Other Ambulatory Visit: Payer: Self-pay

## 2021-04-06 ENCOUNTER — Other Ambulatory Visit: Payer: Medicare Other

## 2021-04-06 DIAGNOSIS — R7989 Other specified abnormal findings of blood chemistry: Secondary | ICD-10-CM

## 2021-04-07 ENCOUNTER — Ambulatory Visit: Payer: Medicare Other | Admitting: Podiatry

## 2021-04-07 ENCOUNTER — Other Ambulatory Visit: Payer: Medicare Other | Admitting: *Deleted

## 2021-04-07 ENCOUNTER — Other Ambulatory Visit: Payer: Self-pay

## 2021-04-07 ENCOUNTER — Telehealth: Payer: Self-pay | Admitting: Interventional Cardiology

## 2021-04-07 DIAGNOSIS — R0602 Shortness of breath: Secondary | ICD-10-CM

## 2021-04-07 DIAGNOSIS — R600 Localized edema: Secondary | ICD-10-CM

## 2021-04-07 DIAGNOSIS — R7989 Other specified abnormal findings of blood chemistry: Secondary | ICD-10-CM

## 2021-04-07 NOTE — Telephone Encounter (Signed)
Pt c/o swelling: STAT is pt has developed SOB within 24 hours  If swelling, where is the swelling located? Both feet  How much weight have you gained and in what time span? States he is 215 lbs, was under 210 or 210 last time he came to the office  Have you gained 3 pounds in a day or 5 pounds in a week? Yes, believes both  Do you have a log of your daily weights (if so, list)? States he is 215 lbs, was under 210 or 210 last time he came to the office  Are you currently taking a fluid pill? yes  Are you currently SOB? no  Have you traveled recently? no    Patient states he noticed he has swelling in both feet. He states he has been having issues with SOB as well, but has not been out walking far. Patient was not SOB on the phone. He states he is coming to the office today for lab work. He states he would like a call back from April.

## 2021-04-07 NOTE — Telephone Encounter (Signed)
I spoke with patient. He reports swelling in both lower legs. First noticed last night.  Swelling is from ankles up to knees. Recent weights-  6/15- 215 lbs 6/14- 217 lbs in the evening 6/14- 212 lbs in the morning Does not have any other readings with him but thinks weight was around 211 lbs on 6/13. Reports weight has been fluctuating for the last few weeks. States the lowest was 202 lbs a few weeks ago.  Reports decreased appetite. Has had shortness of breath for the last couple of months.  Not worsened recently. He doubled his lasix and potassium today due to swelling and has urinated more today.  Will send to Dr Irish Lack for review/recommendations regarding swelling.

## 2021-04-08 LAB — T3, FREE: T3, Free: 2.3 pg/mL (ref 2.0–4.4)

## 2021-04-08 LAB — TSH: TSH: 3.6 u[IU]/mL (ref 0.450–4.500)

## 2021-04-08 LAB — T4, FREE: Free T4: 1.19 ng/dL (ref 0.82–1.77)

## 2021-04-08 MED ORDER — FUROSEMIDE 40 MG PO TABS: 40.0000 mg | ORAL_TABLET | Freq: Every day | ORAL | 2 refills | Status: DC

## 2021-04-08 NOTE — Telephone Encounter (Signed)
Frank Booze, MD  You 8 minutes ago (3:23 PM)     Labs were stable on 6/09.  WOuld give Lasix 40 mg BID, (8AM, 2PM) x 3 days and then 40 mg daily indefinitely. COntinue current dose of potassium. He will need repeat labs in a week.   Patient notified. He will come in for BMP on 6/23.  Prescription for furosemide sent to [facility] on [facility]

## 2021-04-12 NOTE — Progress Notes (Signed)
Patient ID: Frank Berg                 DOB: 02/24/1956                      MRN: [id number]     HPI: Frank Berg is a 65 y.o. male referred by Dr. Irish Lack to pharmacy clinic for HF medication management. PMH is significant for CHF, CAD s/p CABG and AVR in 2011 (age 43), atrial fibrillation, HTN, T2DM, HLD, OSA on CPAP, GERD, and gout. Recent [facility] 02/2021 showed Prox LAD lesion is 50% stenosed. Mid LAD lesion is 90% stenosed. Patent LIMA to LAD. Most recent LVEF 45% on 01/2021. Pt last seen by Oda Kilts, PA on 04/01/21. At that visit BP was 98/64 and 97/60 with HR 100. Additionally, event monitor revealed frequent PACs and PVCs (as many as >50 PVCs in a minute), however low clinic BP limited titration of BB.  Patient presents today in good spirits. Reports medication adherence with CHF medications. Reports lightheadedness - does not occur often. Reports SOB with exertion once or twice per day. Reports bilateral LE swelling and instructed to take furosemide 40 mg BID x3 days (last dose was yesterday) - now taking 40 mg daily. Denies headaches, blurred vision, and chest pain. Usually checks weight daily - yesterday was 211 lbs (normal 210 lbs). In the past week, BP 110/70 and HR 60s. Able to complete all ADLs. Additionally, patient reports copay cost for brand name medications including Xarelto are affordable.  Current CHF meds: telmisartan 40 mg daily (AM), bisoprolol 2.5 mg daily (PM), furosemide 40 mg daily (AM) Previously tried: metoprolol succinate, metoprolol tartrate (fatigue), olmesartan, valsartan BP goal: <130/80 mmHg  Family History: Breast cancer in his sister; Cervical cancer in his sister; Congenital heart disease in his mother; Diabetes in his brother, brother, mother, sister, sister, sister, sister, sister, and son; Heart attack in his father; Heart disease in his mother; Hypertension in his brother and mother; Pancreatic cancer in his brother.   Social History: Former tobacco  smoker (Quit: 11/03/1981 - 0.25 ppd x25 years); He has never used smokeless tobacco. He reports current alcohol use. He reports that he does not use drugs.   Diet: trying to adhere to a low salt diet   Exercise: trying to go back to the gym several times a week, line dancing MWF, pickelball   Home BP readings: 110/70  Wt Readings from Last 3 Encounters:  04/13/21 212 lb 12.8 oz (96.5 kg)  04/01/21 210 lb (95.3 kg)  03/18/21 206 lb (93.4 kg)   BP Readings from Last 3 Encounters:  04/13/21 108/60  04/01/21 98/64  03/18/21 100/70   Pulse Readings from Last 3 Encounters:  04/13/21 (!) 51  04/01/21 100  03/18/21 66    Renal function: Estimated Creatinine Clearance: 63.5 mL/min (A) (by C-G formula based on SCr of 1.33 mg/dL (H)).  Past Medical History:  Diagnosis Date   Atrial fibrillation ([place])    Coronary artery disease    Diabetes mellitus without complication (HCC)    Diabetic polyneuropathy (HCC)    GERD (gastroesophageal reflux disease)    Gout, unspecified    HLD (hyperlipidemia)    Hx of adenomatous polyp of colon 12/03/2020   Hypertension    OSA on CPAP    Sleep apnea     Current Outpatient Medications on File Prior to Visit  Medication Sig Dispense Refill   allopurinol (ZYLOPRIM) 300 MG tablet Take 300  mg by mouth daily.     bisoprolol (ZEBETA) 5 MG tablet Take 0.5 tablets (2.5 mg total) by mouth daily. 15 tablet 11   furosemide (LASIX) 40 MG tablet Take 1 tablet (40 mg total) by mouth daily. 90 tablet 2   magnesium oxide (MAG-OX) 400 MG tablet Take 400 mg by mouth daily.     metFORMIN (GLUCOPHAGE) 500 MG tablet Take 1 tablet (500 mg total) by mouth 2 (two) times daily with a meal.     Omega-3 Fatty Acids (FISH OIL) 1200 MG CPDR Take 1,200 mg by mouth daily.     potassium chloride (MICRO-K) 10 MEQ CR capsule Take 20 mEq by mouth daily.     rosuvastatin (CRESTOR) 5 MG tablet Take 5 mg by mouth every evening.     telmisartan (MICARDIS) 40 MG tablet Take 40 mg by  mouth daily.     VITAMIN D PO Take 1 capsule by mouth daily.     XARELTO 20 MG TABS tablet Take 1 tablet (20 mg total) by mouth every evening. 90 tablet 3   No current facility-administered medications on file prior to visit.    Allergies  Allergen Reactions   Lipitor [Atorvastatin] Other (See Comments)    Body aches   Penicillins Swelling    Swollen face     Assessment/Plan:  1. CHF with EF 45% - BP at goal <130/80 mmHg. Medication adherence appears optimal. Will start Jardiance (empagliflozin) 10 mg daily to better optimize GDMT for CHF management. Continued telmisartan 40 mg daily and bisoprolol 2.5 mg daily. Can consider switching telmisartan to a less potent ARB (losartan) at next visit if BP continues to average in the lower range of goal. Encouraged patient to check BP at least 1 hour after medications and bring BP log to next visit. Patient verbalized understanding. Encouraged patient to aim for a diet full of vegetables, fruit and lean meats (chicken, [place], fish) and to limit salt intake by eating fresh or frozen vegetables (instead of canned), rinse canned vegetables prior to cooking and do not add any additional salt to meals. Encouraged patient to exercise 20-30 minutes daily with the goal of 150 minutes per week. Patient verbalized understanding. Follow-up appointment and BMET scheduled in 2 weeks.  Lorel Monaco, PharmD, [place] [address], [place],  [address] Phone: [phone]; Fax: [phone]

## 2021-04-13 ENCOUNTER — Other Ambulatory Visit: Payer: Medicare Other

## 2021-04-13 ENCOUNTER — Encounter: Payer: Self-pay | Admitting: Pharmacist

## 2021-04-13 ENCOUNTER — Other Ambulatory Visit: Payer: Self-pay

## 2021-04-13 ENCOUNTER — Ambulatory Visit (INDEPENDENT_AMBULATORY_CARE_PROVIDER_SITE_OTHER): Payer: Medicare Other | Admitting: Pharmacist

## 2021-04-13 VITALS — BP 108/60 | HR 51 | Wt 212.8 lb

## 2021-04-13 DIAGNOSIS — I5032 Chronic diastolic (congestive) heart failure: Secondary | ICD-10-CM | POA: Diagnosis not present

## 2021-04-13 MED ORDER — EMPAGLIFLOZIN 10 MG PO TABS: 10.0000 mg | ORAL_TABLET | Freq: Every day | ORAL | 1 refills | Status: DC

## 2021-04-13 NOTE — Patient Instructions (Signed)
It was nice to see you today!  Your goal blood pressure is <130/80 mmHg.   Medication Changes: Begin taking Jardiance (empagliflozin) 10 mg daily  Continue telmisartan 40 mg daily  Continue bisoprolol 2.5 mg daily (0.5 tablet)  Monitor blood pressure at home daily and keep a log (on your phone or piece of paper) to bring with you to your next visit. Write down date, time, blood pressure and pulse.  Keep up the good work with diet and exercise. Aim for a diet full of vegetables, fruit and lean meats (chicken, [place], fish). Try to limit salt intake by eating fresh or frozen vegetables (instead of canned), rinse canned vegetables prior to cooking and do not add any additional salt to meals.

## 2021-04-14 LAB — BASIC METABOLIC PANEL
BUN/Creatinine Ratio: 18 (ref 10–24)
BUN: 30 mg/dL — ABNORMAL HIGH (ref 8–27)
CO2: 24 mmol/L (ref 20–29)
Calcium: 9.8 mg/dL (ref 8.6–10.2)
Chloride: 101 mmol/L (ref 96–106)
Creatinine, Ser: 1.64 mg/dL — ABNORMAL HIGH (ref 0.76–1.27)
Glucose: 89 mg/dL (ref 65–99)
Potassium: 4.8 mmol/L (ref 3.5–5.2)
Sodium: 139 mmol/L (ref 134–144)
eGFR: 46 mL/min/{1.73_m2} — ABNORMAL LOW (ref >59)

## 2021-04-15 ENCOUNTER — Other Ambulatory Visit: Payer: Medicare Other

## 2021-04-15 ENCOUNTER — Telehealth: Payer: Self-pay | Admitting: Pharmacist

## 2021-04-15 NOTE — Telephone Encounter (Signed)
SCr increased from 1.33 on 6/9 to 1/64 on 6/21. SCr increase likely reflective of doubling of Lasix dose for 3 days due to bilateral lower extremity swelling (last day of doubled Lasix dose was the day before BMET was checked). He's remained on telmisartan without recent dose change. SCr should improve since pt has decreased back to normal Lasix dose of 40mg  daily. Still ok with starting Jardiance as discussed on 6/21 visit for CHF, CKD, DM, and CAD benefit. Left message for pt to advise him to stay hydrated, and will keep a close eye on labs with repeat BMET within 2 weeks (CHF med titration appt same day).

## 2021-04-15 NOTE — Telephone Encounter (Signed)
Pt returned call to clinic, below message and lab results relayed to pt.

## 2021-04-24 ENCOUNTER — Emergency Department: Admit: 2021-04-24 | Payer: PRIVATE HEALTH INSURANCE | Primary: Internal Medicine

## 2021-04-24 ENCOUNTER — Inpatient Hospital Stay
Admit: 2021-04-24 | Discharge: 2021-04-25 | Payer: PRIVATE HEALTH INSURANCE | Attending: Student in an Organized Health Care Education/Training Program | Primary: Internal Medicine

## 2021-04-24 ENCOUNTER — Encounter: Admit: 2021-04-24 | Payer: PRIVATE HEALTH INSURANCE | Primary: Internal Medicine

## 2021-04-24 DIAGNOSIS — R600 Localized edema: Secondary | ICD-10-CM

## 2021-04-24 DIAGNOSIS — E119 Type 2 diabetes mellitus without complications: Secondary | ICD-10-CM

## 2021-04-24 DIAGNOSIS — E7800 Pure hypercholesterolemia, unspecified: Secondary | ICD-10-CM

## 2021-04-24 DIAGNOSIS — R0602 Shortness of breath: Secondary | ICD-10-CM

## 2021-04-24 DIAGNOSIS — K219 Gastro-esophageal reflux disease without esophagitis: Secondary | ICD-10-CM

## 2021-04-24 DIAGNOSIS — F488 Other specified nonpsychotic mental disorders: Secondary | ICD-10-CM

## 2021-04-24 DIAGNOSIS — G4733 Obstructive sleep apnea (adult) (pediatric): Secondary | ICD-10-CM

## 2021-04-24 DIAGNOSIS — I2510 Atherosclerotic heart disease of native coronary artery without angina pectoris: Secondary | ICD-10-CM

## 2021-04-24 DIAGNOSIS — Z5189 Encounter for other specified aftercare: Secondary | ICD-10-CM

## 2021-04-24 DIAGNOSIS — I499 Cardiac arrhythmia, unspecified: Secondary | ICD-10-CM

## 2021-04-24 DIAGNOSIS — I10 Essential (primary) hypertension: Secondary | ICD-10-CM

## 2021-04-24 MED ORDER — FUROSEMIDE 10 MG/ML INJECTION SOLUTION
10 mg/mL | Freq: Once | INTRAVENOUS | Status: CP
Start: 2021-04-24 — End: ?
  Administered 2021-04-25: 04:00:00 10 mL via INTRAVENOUS

## 2021-04-25 ENCOUNTER — Inpatient Hospital Stay: Admit: 2021-04-25 | Payer: PRIVATE HEALTH INSURANCE

## 2021-04-25 DIAGNOSIS — Z87891 Personal history of nicotine dependence: Secondary | ICD-10-CM

## 2021-04-25 DIAGNOSIS — Z951 Presence of aortocoronary bypass graft: Secondary | ICD-10-CM

## 2021-04-25 DIAGNOSIS — I2510 Atherosclerotic heart disease of native coronary artery without angina pectoris: Secondary | ICD-10-CM

## 2021-04-25 DIAGNOSIS — I4510 Unspecified right bundle-branch block: Secondary | ICD-10-CM

## 2021-04-25 DIAGNOSIS — Z888 Allergy status to other drugs, medicaments and biological substances status: Secondary | ICD-10-CM

## 2021-04-25 DIAGNOSIS — I4891 Unspecified atrial fibrillation: Secondary | ICD-10-CM

## 2021-04-25 DIAGNOSIS — Z880 Allergy status to penicillin: Secondary | ICD-10-CM

## 2021-04-25 DIAGNOSIS — Z952 Presence of prosthetic heart valve: Secondary | ICD-10-CM

## 2021-04-25 DIAGNOSIS — I272 Pulmonary hypertension, unspecified: Secondary | ICD-10-CM

## 2021-04-25 DIAGNOSIS — I5023 Acute on chronic systolic (congestive) heart failure: Secondary | ICD-10-CM

## 2021-04-25 DIAGNOSIS — Z7901 Long term (current) use of anticoagulants: Secondary | ICD-10-CM

## 2021-04-25 DIAGNOSIS — R0602 Shortness of breath: Secondary | ICD-10-CM

## 2021-04-25 DIAGNOSIS — E119 Type 2 diabetes mellitus without complications: Secondary | ICD-10-CM

## 2021-04-25 DIAGNOSIS — Z7982 Long term (current) use of aspirin: Secondary | ICD-10-CM

## 2021-04-25 DIAGNOSIS — Z9111 Patient's noncompliance with dietary regimen: Secondary | ICD-10-CM

## 2021-04-25 DIAGNOSIS — Z7984 Long term (current) use of oral hypoglycemic drugs: Secondary | ICD-10-CM

## 2021-04-25 DIAGNOSIS — Z79899 Other long term (current) drug therapy: Secondary | ICD-10-CM

## 2021-04-25 DIAGNOSIS — M109 Gout, unspecified: Secondary | ICD-10-CM

## 2021-04-25 DIAGNOSIS — E7800 Pure hypercholesterolemia, unspecified: Secondary | ICD-10-CM

## 2021-04-25 DIAGNOSIS — K219 Gastro-esophageal reflux disease without esophagitis: Secondary | ICD-10-CM

## 2021-04-25 DIAGNOSIS — G4733 Obstructive sleep apnea (adult) (pediatric): Secondary | ICD-10-CM

## 2021-04-25 DIAGNOSIS — I255 Ischemic cardiomyopathy: Secondary | ICD-10-CM

## 2021-04-25 DIAGNOSIS — R600 Localized edema: Secondary | ICD-10-CM

## 2021-04-25 DIAGNOSIS — I110 Hypertensive heart disease with heart failure: Secondary | ICD-10-CM

## 2021-04-25 DIAGNOSIS — Z20822 Contact with and (suspected) exposure to covid-19: Secondary | ICD-10-CM

## 2021-04-25 DIAGNOSIS — I509 Heart failure, unspecified: Secondary | ICD-10-CM | POA: Insufficient documentation

## 2021-04-25 LAB — BASIC METABOLIC PANEL
BKR ANION GAP: 12 (ref 7–17)
BKR BLOOD UREA NITROGEN: 31 mg/dL — ABNORMAL HIGH (ref 8–23)
BKR BUN / CREAT RATIO: 21.7 (ref 8.0–23.0)
BKR CALCIUM: 9.2 mg/dL (ref 8.8–10.2)
BKR CHLORIDE: 100 mmol/L (ref 98–107)
BKR CO2: 25 mmol/L (ref 20–30)
BKR CREATININE: 1.43 mg/dL — ABNORMAL HIGH (ref 0.40–1.30)
BKR EGFR (AFR AMER): 60 mL/min/{1.73_m2} (ref >60)
BKR EGFR (NON AFRICAN AMERICAN): 49 mL/min/{1.73_m2} (ref >60)
BKR GLUCOSE: 88 mg/dL (ref 70–100)
BKR POTASSIUM: 4.1 mmol/L (ref 3.3–5.3)
BKR SODIUM: 137 mmol/L (ref 136–144)

## 2021-04-25 LAB — CBC WITH AUTO DIFFERENTIAL
BKR WAM ABSOLUTE IMMATURE GRANULOCYTES.: 0.02 x 1000/ÂµL (ref 0.00–0.30)
BKR WAM ABSOLUTE IMMATURE GRANULOCYTES.: 0.02 x 1000/ÂµL (ref 0.00–0.30)
BKR WAM ABSOLUTE LYMPHOCYTE COUNT.: 2.1 x 1000/ÂµL (ref 0.60–3.70)
BKR WAM ABSOLUTE LYMPHOCYTE COUNT.: 2.19 x 1000/ÂµL (ref 0.60–3.70)
BKR WAM ABSOLUTE NRBC (2 DEC): 0 x 1000/ÂµL (ref 0.00–1.00)
BKR WAM ABSOLUTE NRBC (2 DEC): 0 x 1000/ÂµL (ref 0.00–1.00)
BKR WAM ANALYZER ANC: 3.47 x 1000/ÂµL (ref 2.00–7.60)
BKR WAM ANALYZER ANC: 3.71 x 1000/ÂµL (ref 2.00–7.60)
BKR WAM BASOPHIL ABSOLUTE COUNT.: 0.06 x 1000/ÂµL (ref 0.00–1.00)
BKR WAM BASOPHIL ABSOLUTE COUNT.: 0.05 x 1000/ÂµL (ref 0.00–1.00)
BKR WAM BASOPHILS: 1 % (ref 0.0–1.4)
BKR WAM BASOPHILS: 0.8 % (ref 0.0–1.4)
BKR WAM EOSINOPHIL ABSOLUTE COUNT.: 0.06 x 1000/ÂµL (ref 0.00–1.00)
BKR WAM EOSINOPHIL ABSOLUTE COUNT.: 0.07 x 1000/ÂµL (ref 0.00–1.00)
BKR WAM EOSINOPHILS: 1 % (ref 0.0–5.0)
BKR WAM EOSINOPHILS: 1.1 % (ref 0.0–5.0)
BKR WAM HEMATOCRIT (2 DEC): 46.4 % (ref 38.50–50.00)
BKR WAM HEMATOCRIT (2 DEC): 40 % (ref 38.50–50.00)
BKR WAM HEMOGLOBIN: 15.4 g/dL (ref 13.2–17.1)
BKR WAM HEMOGLOBIN: 13.6 g/dL (ref 13.2–17.1)
BKR WAM IMMATURE GRANULOCYTES: 0.3 % (ref 0.0–1.0)
BKR WAM IMMATURE GRANULOCYTES: 0.3 % (ref 0.0–1.0)
BKR WAM LYMPHOCYTES: 34.1 % (ref 17.0–50.0)
BKR WAM LYMPHOCYTES: 33 % (ref 17.0–50.0)
BKR WAM MCH (PG): 30.3 pg (ref 27.0–33.0)
BKR WAM MCH (PG): 30.4 pg (ref 27.0–33.0)
BKR WAM MCHC: 33.2 g/dL (ref 31.0–36.0)
BKR WAM MCHC: 34 g/dL (ref 31.0–36.0)
BKR WAM MCV: 91.3 fL (ref 80.0–100.0)
BKR WAM MCV: 89.5 fL (ref 80.0–100.0)
BKR WAM MONOCYTE ABSOLUTE COUNT.: 0.44 x 1000/ÂµL (ref 0.00–1.00)
BKR WAM MONOCYTE ABSOLUTE COUNT.: 0.6 x 1000/ÂµL (ref 0.00–1.00)
BKR WAM MONOCYTES: 7.2 % (ref 4.0–12.0)
BKR WAM MONOCYTES: 9 % (ref 4.0–12.0)
BKR WAM MPV: 9.2 fL (ref 8.0–12.0)
BKR WAM MPV: 9.8 fL (ref 8.0–12.0)
BKR WAM NEUTROPHILS: 56.4 % (ref 39.0–72.0)
BKR WAM NEUTROPHILS: 55.8 % (ref 39.0–72.0)
BKR WAM NUCLEATED RED BLOOD CELLS: 0 % (ref 0.0–1.0)
BKR WAM NUCLEATED RED BLOOD CELLS: 0 % (ref 0.0–1.0)
BKR WAM PLATELETS: 205 x1000/ÂµL (ref 150–420)
BKR WAM PLATELETS: 194 x1000/ÂµL (ref 150–420)
BKR WAM RDW-CV: 14.4 % (ref 11.0–15.0)
BKR WAM RDW-CV: 14.6 % (ref 11.0–15.0)
BKR WAM RED BLOOD CELL COUNT.: 5.08 M/ÂµL (ref 4.00–6.00)
BKR WAM RED BLOOD CELL COUNT.: 4.47 M/ÂµL (ref 4.00–6.00)
BKR WAM WHITE BLOOD CELL COUNT: 6.2 x1000/ÂµL (ref 4.0–11.0)
BKR WAM WHITE BLOOD CELL COUNT: 6.6 x1000/ÂµL (ref 4.0–11.0)

## 2021-04-25 LAB — COMPREHENSIVE METABOLIC PANEL
BKR A/G RATIO: 1.4 (ref 1.0–2.2)
BKR ALANINE AMINOTRANSFERASE (ALT): 16 U/L (ref 9–59)
BKR ALBUMIN: 4.3 g/dL (ref 3.6–4.9)
BKR ALKALINE PHOSPHATASE: 117 U/L (ref 9–122)
BKR ANION GAP: 9 (ref 7–17)
BKR ASPARTATE AMINOTRANSFERASE (AST): 53 U/L — ABNORMAL HIGH (ref 10–35)
BKR AST/ALT RATIO: 3.3
BKR BILIRUBIN TOTAL: 0.6 mg/dL (ref <=1.2)
BKR BLOOD UREA NITROGEN: 30 mg/dL — ABNORMAL HIGH (ref 8–23)
BKR BUN / CREAT RATIO: 18 (ref 8.0–23.0)
BKR CALCIUM: 9.7 mg/dL (ref 8.8–10.2)
BKR CHLORIDE: 100 mmol/L (ref 98–107)
BKR CO2: 28 mmol/L (ref 20–30)
BKR CREATININE: 1.67 mg/dL — ABNORMAL HIGH (ref 0.40–1.30)
BKR EGFR (AFR AMER): 50 mL/min/{1.73_m2} (ref >60)
BKR EGFR (NON AFRICAN AMERICAN): 41 mL/min/{1.73_m2} (ref >60)
BKR GLOBULIN: 3.1 g/dL (ref 2.3–3.5)
BKR GLUCOSE: 110 mg/dL — ABNORMAL HIGH (ref 70–100)
BKR POTASSIUM: 5 mmol/L (ref 3.3–5.3)
BKR PROTEIN TOTAL: 7.4 g/dL (ref 6.6–8.7)
BKR SODIUM: 137 mmol/L (ref 136–144)

## 2021-04-25 LAB — TROPONIN T HIGH SENSITIVITY, 1 HOUR WITH REFLEX (BH GH LMW YH)
BKR TROPONIN T HS 1 HOUR DELTA FROM 0 HOUR: 2 ng/L
BKR TROPONIN T HS 1 HOUR: 83 ng/L — ABNORMAL HIGH

## 2021-04-25 LAB — MAGNESIUM: BKR MAGNESIUM: 2.2 mg/dL (ref 1.7–2.4)

## 2021-04-25 LAB — TROPONIN T HIGH SENSITIVITY, 3 HOUR (BH GH LMW YH)
BKR TROPONIN T HS 1 HOUR DELTA FROM 0 HOUR ON 3HR: 2 ng/L
BKR TROPONIN T HS 3 HOUR DELTA FROM 0 HOUR: -2 ng/L
BKR TROPONIN T HS 3 HOUR: 79 ng/L — ABNORMAL HIGH

## 2021-04-25 LAB — D-DIMER, QUANTITATIVE: BKR D-DIMER: 1.69 mg{FEU}/L — ABNORMAL HIGH (ref <=0.66)

## 2021-04-25 LAB — TROPONIN T HIGH SENSITIVITY, 0 HOUR BASELINE WITH REFLEX (BH GH LMW YH): BKR TROPONIN T HS 0 HOUR BASELINE: 81 ng/L — ABNORMAL HIGH

## 2021-04-25 LAB — SARS COV-2 (COVID-19) RNA: BKR SARS-COV-2 RNA (COVID-19) (YH): NEGATIVE

## 2021-04-25 LAB — NT-PROBNPE: BKR B-TYPE NATRIURETIC PEPTIDE, PRO (PROBNP): 1689 pg/mL — ABNORMAL HIGH (ref <125.0)

## 2021-04-25 MED ORDER — ASPIRIN 81 MG TABLET,DELAYED RELEASE
81 mg | Freq: Every day | ORAL | Status: DC
Start: 2021-04-25 — End: 2021-04-25
  Administered 2021-04-25: 15:00:00 81 mg via ORAL

## 2021-04-25 MED ORDER — DEXTROSE 40 % ORAL GEL
40 % | ORAL | Status: DC | PRN
Start: 2021-04-25 — End: 2021-04-25

## 2021-04-25 MED ORDER — FUROSEMIDE 40 MG TABLET
40 mg | Freq: Every day | ORAL | Status: SS
Start: 2021-04-25 — End: 2021-04-25

## 2021-04-25 MED ORDER — FRUIT JUICE
ORAL | Status: DC | PRN
Start: 2021-04-25 — End: 2021-04-25

## 2021-04-25 MED ORDER — MAGNESIUM OXIDE 400 MG (241.3 MG MAGNESIUM) TABLET
400 mg (241.3 mg magnesium) | Freq: Every day | ORAL | Status: AC
Start: 2021-04-25 — End: ?

## 2021-04-25 MED ORDER — BISOPROLOL FUMARATE 5 MG TABLET
5 mg | Freq: Every day | ORAL | Status: SS
Start: 2021-04-25 — End: 2022-02-24

## 2021-04-25 MED ORDER — SODIUM CHLORIDE 0.9 % (FLUSH) INJECTION SYRINGE
0.9 % | Freq: Three times a day (TID) | INTRAVENOUS | Status: DC
Start: 2021-04-25 — End: 2021-04-25
  Administered 2021-04-25: 09:00:00 0.9 mL via INTRAVENOUS

## 2021-04-25 MED ORDER — FUROSEMIDE 40 MG TABLET
40 mg | ORAL_TABLET | Freq: Every day | ORAL | 1 refills | Status: SS
Start: 2021-04-25 — End: 2022-02-24

## 2021-04-25 MED ORDER — AMIODARONE 200 MG TABLET
200 mg | Freq: Every day | ORAL | Status: DC
Start: 2021-04-25 — End: 2021-04-25
  Administered 2021-04-25: 15:00:00 200 mg via ORAL

## 2021-04-25 MED ORDER — TELMISARTAN 40 MG TABLET
40 mg | Freq: Every day | ORAL | Status: SS
Start: 2021-04-25 — End: 2021-04-25

## 2021-04-25 MED ORDER — TADALAFIL 20 MG TABLET (PULMONARY HYPERTENSION)
20 mg | Status: AC
Start: 2021-04-25 — End: 2022-03-08

## 2021-04-25 MED ORDER — ROSUVASTATIN 5 MG TABLET
5 mg | Freq: Every evening | ORAL | Status: DC
Start: 2021-04-25 — End: 2021-04-25

## 2021-04-25 MED ORDER — ENOXAPARIN 40 MG/0.4 ML SUBCUTANEOUS SYRINGE
40 mg/0.4 mL | SUBCUTANEOUS | Status: DC
Start: 2021-04-25 — End: 2021-04-25

## 2021-04-25 MED ORDER — ACETAMINOPHEN 325 MG TABLET
325 mg | Freq: Four times a day (QID) | ORAL | Status: DC | PRN
Start: 2021-04-25 — End: 2021-04-25

## 2021-04-25 MED ORDER — GLUCAGON 1 MG/ML IN STERILE WATER
Freq: Once | INTRAMUSCULAR | Status: DC | PRN
Start: 2021-04-25 — End: 2021-04-25

## 2021-04-25 MED ORDER — LOSARTAN 50 MG TABLET
50 mg | Freq: Every day | ORAL | Status: DC
Start: 2021-04-25 — End: 2021-04-25
  Administered 2021-04-25: 15:00:00 50 mg via ORAL

## 2021-04-25 MED ORDER — DEXTROSE 10 % IV BOLUS FOR ORDERABLE
INTRAVENOUS | Status: DC | PRN
Start: 2021-04-25 — End: 2021-04-25

## 2021-04-25 MED ORDER — INSULIN LISPRO 100 UNIT/ML (SLIDING SCALE)
100 unit/mL | Freq: Three times a day (TID) | SUBCUTANEOUS | Status: DC
Start: 2021-04-25 — End: 2021-04-25

## 2021-04-25 MED ORDER — ZZ IMS TEMPLATE
Freq: Every day | ORAL | Status: DC
Start: 2021-04-25 — End: 2021-04-25

## 2021-04-25 MED ORDER — SKIM MILK
ORAL | Status: DC | PRN
Start: 2021-04-25 — End: 2021-04-25

## 2021-04-25 MED ORDER — FUROSEMIDE 10 MG/ML INJECTION SOLUTION
10 mg/mL | INTRAVENOUS | Status: DC
Start: 2021-04-25 — End: 2021-04-25

## 2021-04-25 MED ORDER — ALLOPURINOL 300 MG TABLET
300 mg | Freq: Every day | ORAL | Status: SS
Start: 2021-04-25 — End: 2022-06-30

## 2021-04-25 MED ORDER — METOPROLOL SUCCINATE ER 50 MG TABLET,EXTENDED RELEASE 24 HR
50 mg | Freq: Every day | ORAL | Status: DC
Start: 2021-04-25 — End: 2021-04-25
  Administered 2021-04-25: 15:00:00 50 mg via ORAL

## 2021-04-25 MED ORDER — INSULIN LISPRO 100 UNIT/ML (SLIDING SCALE)
100 unit/mL | Freq: Every evening | SUBCUTANEOUS | Status: DC
Start: 2021-04-25 — End: 2021-04-25

## 2021-04-25 MED ORDER — JARDIANCE 10 MG TABLET
10 mg | Freq: Every morning | ORAL | Status: SS
Start: 2021-04-25 — End: 2022-07-06

## 2021-04-25 MED ORDER — COLCHICINE 0.6 MG TABLET
0.6 mg | Freq: Two times a day (BID) | ORAL | Status: AC | PRN
Start: 2021-04-25 — End: 2022-05-17

## 2021-04-25 MED ORDER — SODIUM CHLORIDE 0.9 % (FLUSH) INJECTION SYRINGE
0.9 % | INTRAVENOUS | Status: DC | PRN
Start: 2021-04-25 — End: 2021-04-25

## 2021-04-25 NOTE — Utilization Review (ED)
UM Status: Managed Medicare IP Decompensated heart failure with SOB. Worsening SOB and edema depsite recent increase in lasix dose. BNP 1,600. IV lasix.

## 2021-04-25 NOTE — ED Notes
3:46 AM Floor notified of pending pt transport. Pt has returned from [place].

## 2021-04-25 NOTE — Other
PAYTEN BEAUMIER was discharged via [employer] accompanied by Alone.  Verbalized understanding of discharge instructionsand recommended follow up care as per the after visit summary.  Written discharge instructions provided. Denies any further questions. Vital signs    Vitals:  04/25/21 0251 04/25/21 0332 04/25/21 0400 04/25/21 0609 BP: 102/60 123/68  114/74 Pulse: (!) 55 (!) 54  (!) 58 Resp:  18  18 Temp:  97.9 ?F (36.6 ?C)  97.4 ?F (36.3 ?C) TempSrc:  Oral  Oral SpO2: 100% 96%  94% Weight:   93.2 kg  Height:   6' 2 (1.88 m)  Patient confirmed all belongings returned. Belongings charted in last 7 days: Valuable(s) : Vision; Holiday representative; Wallet (04/25/2021  6:00 AM)

## 2021-04-25 NOTE — ED Notes
11:27 PMAssumed care of pt. Report received from [place], [place].  Patient presented to the ED from home. C/o SOB. Pt reports having shortness of breath past couple days, but reports SOB worsened DOE today. Patient was recently saw his cardiologist for the sob, was put on medication, no relief in symptoms.  PMH open heart surgery in 2011. CXR completed.   Elevated troponin level, 81. 2nd trop 83.  Elevated BNP.  1:15 AM Patient given Lasix 20mg  IV push, per MAR. 1:59 AM  Urinary output, 810 cc. Patient resting on stretcher, Pillow and blanket provided for comfort. Covid negative.   Pringle of the chest completed.  Patient transport booked. Floor aware. 3:31 AMED to Floor HandoffAdmission Dx:      Decompensated heart failure.        Telemetry: 	[x]  Yes		[]  NoOxygen Tank on Stretcher >1000 PSI:  [x]  YesCode Status:   [x]  Full		[]  Partial		[]  No CodeIsolation: 	[x]  None	[]  Contact	[]  Droplet	[]  AirborneSafety Precautions: [x]  None	[]  Sitter   []  Restraints	[]  Suicidal	[]  Fall Risk	Other (specify):           Mentation/Orientation:    A&O (person, place, time and situation) x  4        	 		Disoriented to:          Deficits: []  Hearing impaired	[]  Blind  	[]  Nonverbal		 []  ID/DD (intelectual disability/developmental disability)Ambulation: [x]  Ambulatory	[]  Non-Ambulatory  Diet: [x]  OK to eat	[]  NPO	Other (specify):    Cardiac diet        IV Access: [x]  Yes   []  No     Vital signs Charted in the last hour: [x] Yes    []  No         Other (specify)            Skin Alteration:[]  Pressure Injury	[]  Wound	[x]  None	[]  Skin Not AssessedDiarrhea/Loose stool : []  1x within 24h  []  2x within 24h  []  3x within 24h  [x]  None      C.Diff Order:  []  Ordered- needs to be collected    []  Collected-sent to lab    []  Resulted - Negative C.Diff  []  Resulted - Positive C.Diff    []  Not Ordered   	[x]  N/APatient Belongings:Does the patient have belongings going to the floor?   [x] Yes    []  NoAre the belongings documented?          [x] Yes    []  NoIs someone taking belongings home?   [] Yes    [x]  No   Who (specify):           _________ED RN and MHB #:     [id number]              Please call the ED RN if you have any questions            You can always call the [facility] ED charge nurse at [phone] if you have any concerns [facility] ED [facility] Emergency Department

## 2021-04-25 NOTE — Other
A Member of [facility]	Cardiology Consult Note Consult Information Consultation requested by:  Marlin Canary for consultation:  Mild acute on chronic systolic heart failureHistory of Present Illness 65 y.o. 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  Recent cardiac catheterization showed patent LIMA to the LAD.He lives in [place] and was here visiting relatives.  He came in with 3 days of worsening shortness of breath, PND, and mild edema..  D-dimer of 1.7.  CTA negative.  Troponins in the 80 range and flat.  EKG shows sinus rhythm and old bundle-branch block.  BNP 1690.  He has been diuresed since admission and feels much better now.  Shortness of breath improved and he wants to go home.Allergies  Allergies Allergen Reactions ? Atorvastatin  ? Penicillin G  Medications Inpatient Medications:Current Facility-Administered Medications Medication Dose Route Frequency Provider Last Rate Last Admin ? amiodarone (PACERONE) tablet 200 mg  200 mg Oral Daily Vojnic, Morana, MD     ? aspirin EC delayed release tablet 81 mg  81 mg Oral Daily Vojnic, Morana, MD     ? furosemide (LASIX) injection 20 mg  20 mg Intravenous Q24H Vojnic, Morana, MD     ? insulin lispro (Admelog, HumaLOG) Sliding Scale (See admin instructions for dose)   Subcutaneous TID AC Vojnic, Morana, MD     ? insulin lispro (Admelog, HumaLOG) Sliding Scale (See admin instructions for dose)   Subcutaneous Nightly Vojnic, Morana, MD     ? losartan (COZAAR) tablet 50 mg  50 mg Oral Daily Vojnic, Morana, MD     ? metoprolol succinate XL (TOPROL-XL) 24 hr tablet 50 mg  50 mg Oral Daily Vojnic, Morana, MD     ? rivaroxaban (XARELTO) tablet 20 mg  20 mg Oral Daily (1700) Vojnic, Morana, MD     ? rosuvastatin (CRESTOR) tablet 5 mg  5 mg Oral Nightly Vojnic, Morana, MD     ? sodium chloride 0.9 % flush 3 mL  3 mL IV Push Q8H Vojnic, Morana, MD   3 mL at 04/25/21 0506 Outpatient Medications: ? amiodarone (PACERONE) 200 MG tablet Take 200 mg by mouth daily..   ? aspirin 81 MG EC tablet Take 81 mg by mouth daily..    ? DOCOSAHEXANOIC ACID/EPA (FISH OIL ORAL) Take by mouth daily..   ? furosemide (LASIX) 20 MG tablet Take 1 tablet (20 mg total) by mouth daily.. 30 tablet 0 ? lansoprazole (PREVACID) 30 MG capsule Take 30 mg by mouth daily as needed..    ? magnesium oxide 400 mg Cap Take by mouth daily..    ? metFORMIN (GLUCOPHAGE) 500 MG Immediate Release tablet Take 500 mg by mouth 2 (two) times daily with breakfast and dinner..    ? metoprolol succinate (TOPROL-XL) 50 MG 24 hr tablet Take 50 mg by mouth daily..    ? potassium chloride (MICRO-K) 10 MEQ CR capsule Take 20 mEq by mouth daily..    ? rivaroxaban (XARELTO) 20 mg Tab tablet 20 mg Daily @1700 ..    ? rosuvastatin (CRESTOR) 5 MG tablet Take 5 mg by mouth nightly..    ? valsartan (DIOVAN) 80 MG tablet Take 80 mg by mouth daily..    Past Medical History  Past Medical History: Diagnosis Date ? Arrhythmia  ? Chronic coronary artery disease  ? Diabetes mellitus (HC  Code) (HC CODE) (HC Code)  ? Encounter for blood transfusion  ? GERD (gastroesophageal reflux disease)  ? Hypercholesteremia  ? Hypertension  ? Obstructive sleep apnea  ? Syncope, psychogenic  Past Surgical History: Procedure Laterality Date ? CERVICAL DISC ARTHROPLASTY   ? TOOTH EXTRACTION   ? VALVE REPLACEMENT   Family History  Family History Problem Relation Age of Onset ? Heart disease Mother  ? Diabetes Mother  ? Stroke Mother  ? Heart failure Mother  ? Heart disease Father  ? Heart attack Father  ? Diabetes Sister  ? Diabetes Brother  Social History Social History Tobacco Use Smoking Status Former Smoker ? Types: Cigarettes Smokeless Tobacco Never Used Tobacco Comment  Quit early 1980's Social History Substance and Sexual Activity Alcohol Use Yes Occupational History ? Not on file Review of Systems  CONSTITUTIONAL Negative for anorexia, weight loss, fever, night sweats. EYES: Negative for blurred or double vision. ENT: Negative for presbyacusis, anosmia, or dysgeusia. CARDIOVASCULAR: Per history of present illness. RESPIRATORY Negative for cough, dyspnea, orthopnea, or hemoptysis. GI Negative for nausea, vomiting, constipation or diarrhea. GU: Negative for frequency, nocturia, or hematuria.  ENDOCRINE: Negative for pituitary problems. MUSCULOSKELETAL: Negative for arthritis or myalgia. SKIN: Negative for itching, bruising, or rashes. NEUROLOGIC: Negative for headaches, dizziness, numbness, tingling, or confusion. PSYCHIATRIC: Negative for anxiety or depression. HEMATOLOGIC: Negative for bleeding or bruising. PAIN: No pain or discomfort.  Physical Exam BP 114/74  - Pulse (!) 58  - Temp 97.4 ?F (36.3 ?C) (Oral)  - Resp 18  - Ht 6' 2 (1.88 m)  - Wt 93.2 kg  - SpO2 94%  - BMI 26.38 kg/m?    Temp:  [96.9 ?F (36.1 ?C)-98.6 ?F (37 ?C)] 97.4 ?F (36.3 ?C)Pulse:  [50-60] 58Resp:  [15-20] 18BP: (102-123)/(60-78) 114/74SpO2:  [82 %-100 %] 94 %General:  Alert and appropriate.  No acute distress.HEENT:  Normocephalic and atraumatic.  Oropharynx moist.Respiratory:  Clear to auscultation bilaterally.  No rhonchi, wheezes, or crackles.CV:  RRR with no murmurs, rubs or gallops.  No JVD.   2+ carotid pulses.  No bruits.GI: Soft, non-tender, non-distended.Ext:  No edema.  2+ peripheral pulses.Skin:  No rashes.Neuro/Psych:  Normal mood and affect.  Alert, oriented, and appropriateData No results for input(s): CKTOTAL, TROPONINI, TROPONINT, CKMBINDEX in the last 168 hours.Recent Labs Lab 07/02/222116 07/03/220657 07/03/220806 NA 137 137  --  K 5.0 4.1  --  CL 100 100  --  CO2 28 25  --  BUN 30* 31*  --  CREATININE 1.67* 1.43*  --  GLU 110* 88 84 CALCIUM 9.7 9.2  --  Recent Labs Lab 07/03/220657 MG 2.2 Recent Labs Lab 07/02/222116 07/03/220657 WBC 6.2 6.6 HGB 15.4 13.6 HCT 46.40 40.00 PLT 205 194 No results for input(s): APTT, INR, PTT in the last 168 hours.ECG (reviewed by me):   Sinus bradycardia with first-degree AV block CXR (reviewed by me):  Clear to auscultation.  Cardiomegaly.  Sternal wires.Echo:  4/22?1. Left ventricular ejection fraction, by estimation, is 45%. The left ventricle has mildly decreased function. The left ventricle demonstrates global hypokinesis. There is moderate left ventricular hypertrophy. Left ventricular diastolic parameters are ?consistent with Grade II diastolic dysfunction (pseudonormalization). Would consider possibility of cardiac amyloidosis. ?2. Right ventricular systolic function is mildly reduced. The right ventricular size is moderately enlarged. There is normal pulmonary artery systolic pressure. The estimated right ventricular systolic pressure is 32.6 mmHg. Mildly D-shaped interventricular septum suggests RV pressure/volume overload. ?3. Left atrial size was mild to moderately dilated. ?4.  Right atrial size was moderately dilated. ?5. The mitral valve is normal in structure. Mild mitral valve regurgitation. No evidence of mitral stenosis. ?6. Bioprosthetic aortic valve. Mean gradient 10 mmHg, valve appears to open well. No significant regurgitation. ?7. The inferior vena cava is dilated in size with <50% respiratory variability, suggesting right atrial pressure of 15 mmHg. Cath 5/22 in NCLeft Anterior Descending: Prox LAD lesion is 50% stenosed. Mid LAD lesion is 90% stenosed. Left Circumflex: There is mild diffuse disease throughout the vessel. Right Coronary Artery: Vessel is small. There is mild diffuse disease throughout the vessel. LIMA Graft To Dist LAD Impression 65 y.o. 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  Recent cardiac catheterization showed patent LIMA to the LAD.He lives in [place] and was here visiting relatives.  He came in with 3 days of worsening shortness of breath, PND, and mild edema..  D-dimer of 1.7.  CTA negative.  Troponins in the 80 range and flat.  EKG shows sinus rhythm and old bundle-branch block.  BNP 1690.  He has been diuresed since admission and feels much better now.  Shortness of breath improved and he wants to go home.Plan 1.  Presentation consistent with mild acute on chronic systolic heart failure.  He admits to some dietary indiscretion while visiting his relatives.2.  Increase his Lasix to 40 milligrams daily.3.  Continue baby aspirin, amiodarone, losartan, Toprol, and Xarelto.4.  Resume rosuvastatin.5.  Stable for discharge from my standpoint.  He has a flight back to [place] in the morning.  Signed: Verlene Mayer, M.D.Volanda Napoleon, MDCardiac [phone]/3/202210:05 AM

## 2021-04-25 NOTE — H&P
[facility]	 Medicine History & PhysicalHistory provided by: the patientHistory limited by: no limitationsPatient presents from: HomeSubjective: Chief Complaint:  Shortness of breathHPI 65 year old male with history of CAD status post CABG, HTN, HLD, CHF with last EF being around 45%, diabetes, OSA on CPAP presents to ED with complaint of 3 day history of shortness of breath associated with substernal chest pressure on exertion.  He has been having this symptoms for the last several months, saw his cardiologist and his Lasix was recently increased from 10 milligrams to 20 milligrams, he was recently started on bisoprolol as well (unclear what dose).  He is originally from [place], came here to visit relatives, however developed this worsening symptoms which is why he showed up in the ED. he denies any orthopnea or PND, but endorses increasing lower extremity swelling and increasing weight.  He denies any nausea, vomiting, diarrhea.  He denies any dietary indiscretions.  He denies any falls, head trauma or LOC.  He reports being compliant with his medications.  His main complaint is dyspnea on exertion, usually he was able to walk normally, now he can walk on a few blocks when he needs to rest because he becomes short of breath.Vitals notable for temp 96.9?, heart rate 54, respiratory rate 15, BP 116/78, oxygen saturation 100% on room air.  Labs notable for troponin 81-->83-->79, proBNP 1689, creatinine 1.6, BUN 30, CBC within normal range.Chest x-ray clear without any infiltrates or pulmonary edema.  EKG with sinus bradycardia, old right bundle branch block.Medical History: PMH PSH Past Medical History: Diagnosis Date ? Arrhythmia  ? Chronic coronary artery disease  ? Diabetes mellitus (HC Code) (HC CODE) (HC Code)  ? Encounter for blood transfusion  ? GERD (gastroesophageal reflux disease)  ? Hypercholesteremia  ? Hypertension  ? Obstructive sleep apnea  ? Syncope, psychogenic   Past Surgical History: Procedure Laterality Date ? CERVICAL DISC ARTHROPLASTY   ? TOOTH EXTRACTION   ? VALVE REPLACEMENT     Family History family history includes Diabetes in his brother, mother, and sister; Heart attack in his father; Heart disease in his father and mother; Heart failure in his mother; Stroke in his mother.Social History  reports that he has quit smoking. His smoking use included cigarettes. He has never used smokeless tobacco. He reports current alcohol use. He reports that he does not use drugs.Prior to Admission Medications (Not in a hospital admission) Allergies Allergies Allergen Reactions ? Atorvastatin  ? Penicillin G   Review of Systems: Review of Systems Constitutional: Negative.  HENT: Negative.  Eyes: Negative.  Respiratory: Positive for chest tightness and shortness of breath.  Cardiovascular: Negative.  Gastrointestinal: Negative.  Endocrine: Negative.  Genitourinary: Negative.  Musculoskeletal: Negative.  Skin: Negative.  Allergic/Immunologic: Negative.  Neurological: Negative.  Hematological: Negative.  Psychiatric/Behavioral: Negative.   Objective: Vitals:Last 24 hours: Temp:  [96.9 ?F (36.1 ?C)-98.6 ?F (37 ?C)] 98.6 ?F (37 ?C)Pulse:  [50-60] 55Resp:  [15-20] 15BP: (102-116)/(60-78) 102/60SpO2:  [82 %-100 %] 100 %Physical Exam: Physical ExamConstitutional:     Appearance: He is well-developed. HENT:    Head: Normocephalic and atraumatic.    Mouth/Throat:    Mouth: Mucous membranes are moist.    Pharynx: Oropharynx is clear. Eyes:    Conjunctiva/sclera: Conjunctivae normal.    Pupils: Pupils are equal, round, and reactive to light. Neck:    Vascular: No JVD. Cardiovascular:    Rate and Rhythm: Regular rhythm. Bradycardia present.    Pulses: Normal pulses.    Heart sounds: Normal heart sounds.  No murmur heard.Pulmonary:    Effort: Pulmonary effort is normal.    Breath sounds: Normal breath sounds. Abdominal:    General: Abdomen is flat. Bowel sounds are normal.    Palpations: Abdomen is soft. Musculoskeletal:       General: Normal range of motion.    Cervical back: Normal range of motion and neck supple.    Right lower leg: No edema.    Left lower leg: No edema. Skin:   General: Skin is warm and dry. Neurological:    General: No focal deficit present.    Mental Status: He is alert and oriented to person, place, and time. Psychiatric:       Behavior: Behavior normal.  Labs: Last 24 hours: Recent Results (from the past 24 hour(s)) EKG  Collection Time: 04/24/21  9:00 PM Result Value Ref Range  Heart Rate 56 bpm  QRS Duration 146 ms  Q-T Interval 472 ms  QTC Calculation(Bezet) 455 ms  P Axis 81 deg  R Axis 76 deg  T Axis -3 deg  P-R Interval 216 msec  ECG - SEVERITY Abnormal ECG severity Troponin T High Sensitivity, Emergency; 0 hour baseline AND 1 hour with reflex (3 hour)  Collection Time: 04/24/21  9:16 PM Result Value Ref Range  High Sensitivity Troponin T 81 (H) See Comment ng/L Comprehensive metabolic panel  Collection Time: 04/24/21  9:16 PM Result Value Ref Range  Sodium 137 136 - 144 mmol/L  Potassium 5.0 3.3 - 5.3 mmol/L  Chloride 100 98 - 107 mmol/L  CO2 28 20 - 30 mmol/L  Anion Gap 9 7 - 17  Glucose 110 (H) 70 - 100 mg/dL  BUN 30 (H) 8 - 23 mg/dL  Creatinine 1.61 (H) 0.96 - 1.30 mg/dL  Calcium 9.7 8.8 - 04.5 mg/dL  BUN/Creatinine Ratio 40.9 8.0 - 23.0  Total Protein 7.4 6.6 - 8.7 g/dL  Albumin 4.3 3.6 - 4.9 g/dL  Total Bilirubin 0.6 <=8.1 mg/dL  Alkaline Phosphatase 191 9 - 122 U/L  Alanine Aminotransferase (ALT) 16 9 - 59 U/L  Aspartate Aminotransferase (AST) 53 (H) 10 - 35 U/L  Globulin 3.1 2.3 - 3.5 g/dL  A/G Ratio 1.4 1.0 - 2.2  AST/ALT Ratio 3.3 See Comment  eGFR (Afr Amer) 50 >60 mL/min/1.5m2  eGFR (NON African-American) 41 >60 mL/min/1.69m2 CBC auto differential  Collection Time: 04/24/21  9:16 PM Result Value Ref Range  WBC 6.2 4.0 - 11.0 x1000/?L  RBC 5.08 4.00 - 6.00 M/?L  Hemoglobin 15.4 13.2 - 17.1 g/dL  Hematocrit 47.82 95.62 - 50.00 %  MCV 91.3 80.0 - 100.0 fL  MCH 30.3 27.0 - 33.0 pg  MCHC 33.2 31.0 - 36.0 g/dL  RDW-CV 13.0 86.5 - 78.4 %  Platelets 205 150 - 420 x1000/?L  MPV 9.2 8.0 - 12.0 fL  Neutrophils 56.4 39.0 - 72.0 %  Lymphocytes 34.1 17.0 - 50.0 %  Monocytes 7.2 4.0 - 12.0 %  Eosinophils 1.0 0.0 - 5.0 %  Basophil 1.0 0.0 - 1.4 %  Immature Granulocytes 0.3 0.0 - 1.0 %  nRBC 0.0 0.0 - 1.0 %  ANC(Abs Neutrophil Count) 3.47 2.00 - 7.60 x 1000/?L  Absolute Lymphocyte Count 2.10 0.60 - 3.70 x 1000/?L  Monocyte Absolute Count 0.44 0.00 - 1.00 x 1000/?L  Eosinophil Absolute Count 0.06 0.00 - 1.00 x 1000/?L  Basophil Absolute Count 0.06 0.00 - 1.00 x 1000/?L  Absolute Immature Granulocyte Count 0.02 0.00 - 0.30 x 1000/?L  Absolute nRBC 0.00 0.00 - 1.00 x  1000/?L NT-proBrain natriuretic peptide  Collection Time: 04/24/21  9:16 PM Result Value Ref Range  B-Type Natriuretic Peptide, Pro (proBNP) 1,689.0 (H) <125.0 pg/mL Troponin T High Sensitivity, 1 Hour With Reflex ([facility] LM[facility] Y[facility])  Collection Time: 04/24/21 10:23 PM Result Value Ref Range  High Sensitivity Troponin T 83 (H) See Comment ng/L  1 hour Delta from 0 Hour, HS-Troponin T 2 ng/L Troponin T High Sensitivity, 3 Hour ([facility])  Collection Time: 04/25/21 12:37 AM Result Value Ref Range  High Sensitivity Troponin T 79 (H) See Comment ng/L  1 hour Delta from 0 Hour, HS-Troponin T 2 ng/L  3 hour Delta from 0 Hour, HS-Troponin T -2 ng/L SARS CoV-2 (COVID-19) PCR - [place] [facility])  Collection Time: 04/25/21  1:05 AM  Specimen: Nasopharynx; Viral Result Value Ref Range  SARS-CoV-2 RNA (COVID-19)  Negative Negative Diagnostics:CXR portable Final Result   Small right pleural effusion, which may be chronic as it is similar in appearance to prior chest radiograph obtained in 2017. Otherwise no acute cardiopulmonary findings.  Reported and signed by:  Cori Razor, MD   ECG/Tele Events: I have reviewed the patient's ECG as resulted in the EMR.Assessment: 65 year old male with history of CAD status post CABG, HTN, HLD, CHF with last EF being around 45%, diabetes, OSA on CPAP presents to ED with complaint of 3 day history of shortness of breath associated with substernal chest pressure on exertion.Principal Problem:  Decompensated heart failure (HC Code) (HC CODE) (HC Code)  SNOMED [place](R): DECOMPENSATED CARDIAC FAILURE  Plan: CHF exacerbation-last echocardiogram in system from 2017 with EF 40 to 45%.  The patient was recently seen by his cardiologist, Lasix increased to 20 milligrams daily and he was started on bisoprolol, unknown dose.  Has significant cardiac history CAD status post CABG.  No dietary indiscretions.  Symptomatology he prescribed consistent with CHF exacerbation.  On chest x-ray no signs of edema, lungs clear to auscultation, no JVD or lower extremity edema found.  ProBNP is elevated.  Small pleural effusion notable on chest x-ray.  Other differential of his shortness of breath could include amiodarone toxicity, though no clear interstitial infiltrate on chest x-ray.Cardiology consult Strict intake and output Daily weights Monitor on telemetry Lasix 20 milligrams daily Check D-dimer, due to shortness of breath and unclear clinical signs of CHF exacerbation Continue with bisoprolol, conversion to Toprol XL Continue with aspirin Consider noncontrast [place] chestContinue losartanAFib Continue Xarelto Continue beta-blockerDiabetes-on metformin and Jardiance at home Insulin sliding scaleFingerstick q.a.c. and q.h.s.DVT prophylaxis-on ACNotifications: PCP: [name] [phone]    I have personally notified the patient's primary care provider of this admission. No  Family was notified of this admission. YesI have personally discussed the plan with the patient and/or family. YesI have reviewed the plan of care with the bedside nurse, including an emphasis on the most important aspects of care for the next 12 hours: yesAdvance Care Planning Advance Care Planning Patient has a living will: noPatient has healthcare representative/proxy: [email or web address] enter code [email or web address] if patient answers Yes or code [id number] if patient answers No to any of the above questions and you are not completing a 30 minute or more Advanced Care Planning encounter. Electronically Signed:Elita Dame, MD 04/25/2021, 1:39 AMAddendum: None

## 2021-04-25 NOTE — Discharge Summary
[facility] SummaryPatient Data:  Patient Name: Frank Bradley Admit date: 04/24/2021 Age: 65 y.o. Discharge date: 04/25/2021 DOB: 11/17/1955	 Discharge Attending Physician: Scharlene Corn*  MRN: [id number]	 Discharged Condition: fair PCP: [name], MD Disposition: Home  Principal Diagnosis: Mild acute exacerbation of CHF.Secondary Diagnoses: CAD status post CABG, HTN, HLD, CHF with last EF being around 45%, diabetes, OSA on CPAP Issues to be Addressed Post Discharge: Issues to be Addressed Post Discharge:1.	Diet and medication compliance2.	3.	Relevant Medications on Discharge:None.Pending Labs and Tests: Follow-up Information:[name], [name], [id number] [facility] [address] [address] [place] [phone] up in 1 week(s) No future appointments.Hospital Course: Hospital Course: 65 year old male with history of CAD status post CABG, HTN, HLD, CHF with last EF being around 45%, diabetes, OSA on CPAP presents to ED with complaint of 3 day history of shortness of breath associated with substernal chest pressure on exertion.  He has been having this symptoms for the last several months, saw his cardiologist and his Lasix was recently increased from 10 milligrams to 20 milligrams, he was recently started on bisoprolol as well (unclear what dose).  He is originally from [place], came here to visit relatives, however developed this worsening symptoms which is why he showed up in the ED. he denies any orthopnea or PND, but endorses increasing lower extremity swelling and increasing weight.  He denies any nausea, vomiting, diarrhea.  He denies any dietary indiscretions.  He denies any falls, head trauma or LOC.  He reports being compliant with his medications.  His main complaint is dyspnea on exertion, usually he was able to walk normally, now he can walk on a few blocks when he needs to rest because he becomes short of breath.?Vitals notable for temp 96.9?, heart rate 54, respiratory rate 15, BP 116/78, oxygen saturation 100% on room air.  Labs notable for troponin 81-->83-->79, proBNP 1689, creatinine 1.6, BUN 30, CBC within normal range.Chest x-ray clear without any infiltrates or pulmonary edema.  EKG with sinus bradycardia, old right bundle branch block.Hospital medicine course: Patient was put on lasix 40 mg IV. He diuresed well. He significantly improved and was ambulatory without shortness of breath. Cardiology was consulted. He was deemed medically fit to be discharged. He was asked to resume his home medication with increase in furosemide to 40 mg Daily. Patient was instructed to eat healthy and avoid sugar sweetened beverages.Inpatient Consultants and summary of recommendations:CardiologyPertinent Procedures or Surgeries:  NonePertinent lab findings and test results: Objective: Recent Labs Lab 07/02/222116 07/03/220657 WBC 6.2 6.6 HGB 15.4 13.6 HCT 46.40 40.00 PLT 205 194  Recent Labs Lab 07/02/222116 07/03/220657 NEUTROPHILS 56.4 55.8  Recent Labs Lab 07/02/222116 07/03/220657 07/03/220806 07/03/221213 NA 137 137  --   --  K 5.0 4.1  --   --  CL 100 100  --   --  CO2 28 25  --   --  BUN 30* 31*  --   --  CREATININE 1.67* 1.43*  --   --  GLU 110* 88   < > 89 ANIONGAP 9 12  --   --   < > = values in this interval not displayed.  Recent Labs Lab 07/02/222116 07/03/220657 CALCIUM 9.7 9.2 MG  --  2.2  Recent Labs Lab 07/02/222116 ALT 16 AST 53* ALKPHOS 117 BILITOT 0.6  No results for input(s): PTT, LABPROT, INR in the last 168 hours. Culture Information:No results for input(s): LABBLOO, LABURIN, LOWERRESPIRA in the last 168 hours.Imaging: Imaging results last 1 week:  CXR portableResult Date: 04/24/2021 Small right  pleural effusion, which may be chronic as it is similar in appearance to prior chest radiograph obtained in 2017. Otherwise no acute cardiopulmonary findings. Reported and signed by:  Cori Razor, MD [place] Chest wo IV ContrastResult Date: 04/25/2021 No evidence of pulmonary consolidation. Stable cardiomegaly compared to prior studies. 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 Report Initiated by:  Clelia Schaumann, MD Reported and signed by:  Alberteen Sam, MD Diet:  Heart healthy dietMobility:    Physical Exam Discharge vitals: Temp:  [96.9 ?F (36.1 ?C)-98.6 ?F (37 ?C)] 97.4 ?F (36.3 ?C)Pulse:  [50-60] 58Resp:  [15-20] 18BP: (102-123)/(60-78) 114/74SpO2:  [82 %-100 %] 94 %Device (Oxygen Therapy): room air Pertinent Findings of Physical Exam: UnremarkableCognitive Status at Discharge: Baseline Alert and Oriented x 3Discharge Physical Exam:Constitutional:     Appearance: He is well-developed. HENT:    Head: Normocephalic and atraumatic.    Mouth/Throat:    Mouth: Mucous membranes are moist.    Pharynx: Oropharynx is clear. Eyes:    Conjunctiva/sclera: Conjunctivae normal.    Pupils: Pupils are equal, round, and reactive to light. Neck:    Vascular: No JVD. Cardiovascular:    Rate and Rhythm: Regular rhythm. Bradycardia present.    Pulses: Normal pulses.    Heart sounds: Normal heart sounds. No murmur heard.Pulmonary:    Effort: Pulmonary effort is normal.    Breath sounds: Normal breath sounds. No crackles.Abdominal:    General: Abdomen is flat. Bowel sounds are normal.    Palpations: Abdomen is soft. ?Allergies Allergies Allergen Reactions ? Atorvastatin  ? Penicillin G   PMH PSH Past Medical History: Diagnosis Date ? Arrhythmia  ? Chronic coronary artery disease  ? Diabetes mellitus (HC Code) (HC CODE) (HC Code)  ? Encounter for blood transfusion  ? GERD (gastroesophageal reflux disease)  ? Hypercholesteremia  ? Hypertension  ? Obstructive sleep apnea  ? Syncope, psychogenic   Past Surgical History: Procedure Laterality Date ? CERVICAL DISC ARTHROPLASTY   ? TOOTH EXTRACTION   ? VALVE REPLACEMENT    Social History Family History Social History Tobacco Use ? Smoking status: Former Smoker   Types: Cigarettes ? Smokeless tobacco: Never Used ? Tobacco comment: Quit early 1980's Substance Use Topics ? Alcohol use: Yes  Family History Problem Relation Age of Onset ? Heart disease Mother  ? Diabetes Mother  ? Stroke Mother  ? Heart failure Mother  ? Heart disease Father  ? Heart attack Father  ? Diabetes Sister  ? Diabetes Brother   Discharge Medications: Discharge: Current Discharge Medication List  CONTINUE these medications which have CHANGED  Details furosemide (LASIX) 40 mg tablet Take 1 tablet (40 mg total) by mouth daily.Qty: 30 tablet, Refills: 0Start date: 04/25/2021, End date: 05/25/2021   CONTINUE these medications which have NOT CHANGED  Details allopurinoL (ZYLOPRIM) 300 mg tablet Take 450 mg by mouth daily.  bisoprolol (ZEBETA) 5 mg tablet Take 2.5 mg by mouth daily.  colchicine (COLCRYS) 0.6 mg tablet Take 0.6 mg by mouth 2 (two) times daily as needed.  DOCOSAHEXANOIC ACID/EPA (FISH OIL ORAL) Take 1 capsule by mouth daily.  JARDIANCE 10 mg tablet Take 10 mg by mouth daily before breakfast.  lansoprazole (PREVACID) 30 MG capsule Take 30 mg by mouth daily as needed..   magnesium oxide (MAG-OX) 400 mg (241.3 mg magnesium) tablet Take 400 mg by mouth daily.  metFORMIN (GLUCOPHAGE) 500 MG Immediate Release tablet Take 500 mg by mouth 2 (two) times daily with breakfast and dinner..   potassium chloride (  MICRO-K) 10 MEQ CR capsule Take 20 mEq by mouth daily..   rivaroxaban (XARELTO) 20 mg Tab tablet 20 mg Daily @1700 ..   rosuvastatin (CRESTOR) 5 MG tablet Take 5 mg by mouth nightly.Marland Kitchen amiodarone (PACERONE) 200 MG tablet Take 200 mg by mouth daily.Marland Kitchen  aspirin 81 MG EC tablet Take 81 mg by mouth daily..   magnesium oxide 400 mg Cap Take by mouth daily..   metoprolol succinate (TOPROL-XL) 50 MG 24 hr tablet Take 50 mg by mouth daily..   tadalafiL, pulm. hypertension, (ADCIRCA) 20 mg Tab tablet TAKE 1 TABLET BY MOUTH DAILY AS NEEDED  valsartan (DIOVAN) 80 MG tablet Take 80 mg by mouth daily.Marland Kitchen    STOP taking these medications   telmisartan (MICARDIS) 40 mg tablet    There was at total of approximately 40 minutes spent on the discharge of this patientElectronically Signed:Neidra Girvan Mearl Latin, MD MS7/12/2020 12:51 PM

## 2021-04-25 NOTE — Plan of Care
Plan of Care Overview/ Patient Status    Assumed care @0430  7/3, admitted with diagnosis of decompensated heart failure, sinus bradycardia on telemetry, room air saturating in mid 90s, will continue to monitor and follow plan of care

## 2021-04-29 ENCOUNTER — Ambulatory Visit (INDEPENDENT_AMBULATORY_CARE_PROVIDER_SITE_OTHER): Payer: Medicare Other | Admitting: Pharmacist

## 2021-04-29 ENCOUNTER — Other Ambulatory Visit: Payer: Medicare Other

## 2021-04-29 ENCOUNTER — Other Ambulatory Visit: Payer: Self-pay

## 2021-04-29 VITALS — BP 100/68 | HR 57 | Wt 205.0 lb

## 2021-04-29 DIAGNOSIS — I5032 Chronic diastolic (congestive) heart failure: Secondary | ICD-10-CM

## 2021-04-29 DIAGNOSIS — R0602 Shortness of breath: Secondary | ICD-10-CM

## 2021-04-29 DIAGNOSIS — I5021 Acute systolic (congestive) heart failure: Secondary | ICD-10-CM

## 2021-04-29 DIAGNOSIS — R600 Localized edema: Secondary | ICD-10-CM

## 2021-04-29 NOTE — Progress Notes (Signed)
Patient ID: [name]                 DOB: May 16, 1956                      MRN: [id number]     HPI: Frank Berg is a 65 y.o. male referred by Dr. Irish Lack to pharmacy clinic for HF medication management. PMH is significant for CHF, CAD s/p CABG and AVR in 2011 (age 31), atrial fibrillation, HTN, T2DM, HLD, OSA on CPAP, GERD, and gout. Recent [facility] 02/2021 showed Prox LAD lesion is 50% stenosed. Mid LAD lesion is 90% stenosed. Patent LIMA to LAD. Most recent LVEF 45% on 01/2021. Pt last seen by Oda Kilts, PA on 04/01/21. At that visit BP was 98/64 and 97/60 with HR 100. Additionally, event monitor revealed frequent PACs and PVCs (as many as >50 PVCs in a minute), however low clinic BP limited titration of BB. At 6/21 visit, SCr had bumped from 1.33 to 1.64 likely due to doubling of Lasix dose for 3 days because of edema. He was started on Jardiance for CHF, CKD, DM, and CAD benefit.  Since last visit, pt presented to  [facility] 7/2 - 7/3 with increased SOB especially with exertion, as well as edema in his ankles. He was diuresed with Lasix 40mg  IV which improved his ambulation without SOB. He was discharged on valsartan 80mg  daily instead of telmisartan 40mg  daily, and advised to increase his Lasix from 20mg  to 40mg  daily (per our records he should have already been taking 40mg  daily).  Patient presents today in good spirits. He did stop his telmisartan but never started on valsartan. Reports compliance with other meds, has been taking Lasix 40mg  daily (was not a dose increase at discharge from [facility]). Denies dizziness. LE edema has improved, none noted on exam today. SOB with exertion has improved as well, pt has been working out in [employer]. Hasn't been drinking as much water. Concerned about progression of  HF since he lives alone. Weight down 7-8 lbs today from last visit a few weeks ago. Going back to [place] next [place] for about 5 days for jazz fest.  Current CHF meds:  valsartan 80 mg daily  (AM) - never started taking  bisoprolol 2.5 mg daily (PM) furosemide 40 mg daily (AM) Jardiance 10mg  daily  Previously tried: metoprolol succinate, metoprolol tartrate (fatigue), olmesartan, valsartan BP goal: <130/80 mmHg  Family History: Breast cancer in his sister; Cervical cancer in his sister; Congenital heart disease in his mother; Diabetes in his brother, brother, mother, sister, sister, sister, sister, sister, and son; Heart attack in his father; Heart disease in his mother; Hypertension in his brother and mother; Pancreatic cancer in his brother.   Social History: Former tobacco smoker (Quit: 11/03/1981 - 0.25 ppd x25 years); He has never used smokeless tobacco. He reports current alcohol use. He reports that he does not use drugs.   Diet: trying to adhere to a low salt diet   Exercise: trying to go back to the gym several times a week, line dancing MWF, pickelball   Home BP readings: hasn't been checking  Wt Readings from Last 3 Encounters:  04/13/21 212 lb 12.8 oz (96.5 kg)  04/01/21 210 lb (95.3 kg)  03/18/21 206 lb (93.4 kg)   BP Readings from Last 3 Encounters:  04/13/21 108/60  04/01/21 98/64  03/18/21 100/70   Pulse Readings from Last 3 Encounters:  04/13/21 (!) 51  04/01/21 100  03/18/21 66    Renal function: CrCl cannot be calculated (Unknown ideal weight.).  Past Medical History:  Diagnosis Date   Atrial fibrillation ([place])    Coronary artery disease    Diabetes mellitus without complication (HCC)    Diabetic polyneuropathy (HCC)    GERD (gastroesophageal reflux disease)    Gout, unspecified    HLD (hyperlipidemia)    Hx of adenomatous polyp of colon 12/03/2020   Hypertension    OSA on CPAP    Sleep apnea     Current Outpatient Medications on File Prior to Visit  Medication Sig Dispense Refill   allopurinol (ZYLOPRIM) 300 MG tablet Take 300 mg by mouth daily.     bisoprolol (ZEBETA) 5 MG tablet Take 0.5 tablets (2.5 mg total) by mouth daily. 15  tablet 11   empagliflozin (JARDIANCE) 10 MG TABS tablet Take 1 tablet (10 mg total) by mouth daily before breakfast. 90 tablet 1   furosemide (LASIX) 40 MG tablet Take 1 tablet (40 mg total) by mouth daily. 90 tablet 2   magnesium oxide (MAG-OX) 400 MG tablet Take 400 mg by mouth daily.     metFORMIN (GLUCOPHAGE) 500 MG tablet Take 1 tablet (500 mg total) by mouth 2 (two) times daily with a meal.     Omega-3 Fatty Acids (FISH OIL) 1200 MG CPDR Take 1,200 mg by mouth daily.     potassium chloride (MICRO-K) 10 MEQ CR capsule Take 20 mEq by mouth daily.     rosuvastatin (CRESTOR) 5 MG tablet Take 5 mg by mouth every evening.     telmisartan (MICARDIS) 40 MG tablet Take 40 mg by mouth daily.     VITAMIN D PO Take 1 capsule by mouth daily.     XARELTO 20 MG TABS tablet Take 1 tablet (20 mg total) by mouth every evening. 90 tablet 3   No current facility-administered medications on file prior to visit.    Allergies  Allergen Reactions   Lipitor [Atorvastatin] Other (See Comments)    Body aches   Penicillins Swelling    Swollen face     Assessment/Plan:  1. CHF with EF 45% - BP at goal <130/80 mmHg, actually on the soft end today despite telmisartan d/c at [facility], pt didn't start valsartan at discharge. Hoping to start [facility] but worried about hypotension. Checking BMET today with recent Jardiance start. Will plan to continue Jardiance 10mg  daily, bisoprolol 2.5mg  daily (HR upper 50s), and furosemide 40mg  daily. Will call pt tomorrow with lab results. Ideally will have him monitor BP at home for the next week and if readings are stable, will try low dose Entresto.  Jonavon Trieu E. Leigh Kaeding, PharmD, BCACP, [place] [address], [place], [place] [address] Phone: [phone]; Fax: [phone] 04/29/2021 2:47 PM

## 2021-04-29 NOTE — Patient Instructions (Signed)
It was nice to see you today!  We'll check labs today  For now, continue on your current medications and I'll give you a call tomorrow  Monitor your blood pressure at home. Ideally we'd like to start you on a low dose of Entresto which helps your heart pump stronger but can lower your blood pressure

## 2021-04-30 ENCOUNTER — Telehealth: Payer: Self-pay | Admitting: Pharmacist

## 2021-04-30 LAB — BASIC METABOLIC PANEL
BUN/Creatinine Ratio: 20 (ref 10–24)
BUN: 32 mg/dL — ABNORMAL HIGH (ref 8–27)
CO2: 19 mmol/L — ABNORMAL LOW (ref 20–29)
Calcium: 9.8 mg/dL (ref 8.6–10.2)
Chloride: 101 mmol/L (ref 96–106)
Creatinine, Ser: 1.61 mg/dL — ABNORMAL HIGH (ref 0.76–1.27)
Glucose: 143 mg/dL — ABNORMAL HIGH (ref 65–99)
Potassium: 4.7 mmol/L (ref 3.5–5.2)
Sodium: 138 mmol/L (ref 134–144)
eGFR: 47 mL/min/{1.73_m2} — ABNORMAL LOW (ref >59)

## 2021-04-30 NOTE — Telephone Encounter (Signed)
BMET stable after adding Jardiance for CHF/DM/CKD. BP was soft yesterday - pt aware of lab results and to monitor BP at home for the next week. I'll call him and hopefully will have BP room to start low dose Entresto (would cut Lasix dose in half and stop K supplementation at that time). Pt in agreement with plan.

## 2021-05-04 ENCOUNTER — Encounter: Payer: Self-pay | Admitting: Adult Health

## 2021-05-04 ENCOUNTER — Ambulatory Visit: Payer: Self-pay | Admitting: Adult Health

## 2021-05-04 NOTE — ED Provider Notes
HistoryChief Complaint Patient presents with ? Shortness of Breath   Inc sob esp with exertion, recently put on med by cardiologist for sob - does not feel this is effective - cardiac hx with open heart 2011 - edema ankles R>L  The history is provided by the patient. No language interpreter was used. OtherThis is a new problem. The current episode started more than 2 days ago. The problem occurs rarely. The problem has not changed since onset.Associated symptoms include chest pain and shortness of breath. Pertinent negatives include no abdominal pain and no headaches. Nothing aggravates the symptoms. Nothing relieves the symptoms. He has tried nothing for the symptoms.  Past Medical History: Diagnosis Date ? Arrhythmia  ? Chronic coronary artery disease  ? Diabetes mellitus (HC Code)  ? Encounter for blood transfusion  ? GERD (gastroesophageal reflux disease)  ? Hypercholesteremia  ? Hypertension  ? Obstructive sleep apnea  ? Syncope, psychogenic  Past Surgical History: Procedure Laterality Date ? CERVICAL DISC ARTHROPLASTY   ? TOOTH EXTRACTION   ? VALVE REPLACEMENT   Family History Problem Relation Age of Onset ? Heart disease Mother  ? Diabetes Mother  ? Stroke Mother  ? Heart failure Mother  ? Heart disease Father  ? Heart attack Father  ? Diabetes Sister  ? Diabetes Brother  Social History Socioeconomic History ? Marital status: Married Tobacco Use ? Smoking status: Former Smoker   Types: Cigarettes ? Smokeless tobacco: Never Used ? Tobacco comment: Quit early 1980's Substance and Sexual Activity ? Alcohol use: Yes ? Drug use: No ED Other Social History E-cigarette/Vaping Substances E-cigarette/Vaping Devices Review of Systems Constitutional: Negative for chills and fever. Respiratory: Positive for shortness of breath. Negative for cough.  Cardiovascular: Positive for chest pain. Gastrointestinal: Negative for abdominal pain, diarrhea, nausea and vomiting. Genitourinary: Negative for dysuria. Neurological: Negative for weakness, numbness and headaches. All other systems reviewed and are negative. Physical ExamED Triage Vitals [04/24/21 2016]BP: 113/69Pulse: 60Pulse from  O2 sat: n/aResp: 20Temp: (!) 96.9 ?F (36.1 ?C)Temp src: TemporalSpO2: 100 % BP 116/78  - Pulse (!) 54  - Temp 98 ?F (36.7 ?C) (Oral)  - Resp 15  - Wt 94.6 kg (208 lb 8.9 oz)  - SpO2 100%  - BMI 26.78 kg/m? Physical ExamVitals and nursing note reviewed. Constitutional:     General: He is not in acute distress.   Appearance: He is well-developed. He is not diaphoretic. HENT:    Head: Normocephalic and atraumatic. Eyes:    Pupils: Pupils are equal, round, and reactive to light. Cardiovascular:    Rate and Rhythm: Normal rate and regular rhythm.    Heart sounds: Normal heart sounds. Pulmonary:    Effort: Pulmonary effort is normal. No respiratory distress.    Breath sounds: Normal breath sounds. Abdominal:    General: Bowel sounds are normal.    Palpations: Abdomen is soft.    Tenderness: There is no abdominal tenderness. Musculoskeletal:       General: Normal range of motion.    Cervical back: Normal range of motion and neck supple.    Comments: Trace edema bilaterally Skin:   General: Skin is warm and dry. Neurological:    Mental Status: He is alert and oriented to person, place, and time.  ProceduresProcedures  PGY- 4 Emergency Medicine Resident MDM: ----------------------------------------------------------------------------Presentation:  Pt is a 65 y.o. male pt, hx of atrial fibrillation (on xarelto), CAD, CABG (2011), diabetes here today with shortness of breath ongoing for the past 3 days. Gets care in [place],  visiting family here for the weekend. Recently had lasix dose increased 1 week ago. Has had worsening of symptoms and also endorsing some chest pressure with exertion. Denies worsening leg edema, fevers, cough. - remaining review of systems and physical exam as detailed belowDDx: #SOB, exertional chest pressure Overall, cardiac history raises suspicion for ACS or possible decompensated CHF. Pt is out of breath with exertion and speaking with long sentences, but well appearing at rest. Will likely need admission for high risk chest pain should work up return negative. - ACS work up - consider diuresis - reassess MDM/COURSE:- work up above - Pt with elevated BNP and small L pleural effusion. Symptoms possibly secondary to volume overload. Will diurese and admit to medicine for CHF exacerbation and high risk chest pain. This patient was presented to and discussed with Dr. Sanjuana Kava and a treatment plan and disposition were collaboratively agreed upon.Vinetta Bergamo, PGY 4[facility]ale Emergency MedicineMHB# [phone] Agree with Resident's History and Physical Exam as well as their assessment, treatment plan and medical decision making.  Patient seen and examined by myself. All Resident procedures and patient care supervised by myself. Avis Epley, D.O.---------------------------------------------------------------------------------------------------------------------ED CourseClinical Impressions as of 05/04/21 1839 Decompensated heart failure (HC Code) (HC CODE) (HC Code)  ED DispositionNo disposition selected since last refresh of note. Lysle Pearl, MDResident07/03/22 [id number] Avis Epley, DO07/12/22 1839

## 2021-05-07 NOTE — Telephone Encounter (Signed)
Called pt to follow up with home BP readings. He states he has been out of town and hasn't checked BP at all. Will be back home on Monday and is agreeable to check his BP once daily for a week. I'll call back at that time for an update.

## 2021-05-13 ENCOUNTER — Ambulatory Visit: Payer: Medicare Other | Admitting: Emergency Medicine

## 2021-05-13 ENCOUNTER — Ambulatory Visit: Payer: Medicare Other

## 2021-05-13 ENCOUNTER — Other Ambulatory Visit: Payer: Self-pay | Admitting: Emergency Medicine

## 2021-05-13 ENCOUNTER — Other Ambulatory Visit: Payer: Self-pay

## 2021-05-13 ENCOUNTER — Encounter: Payer: Self-pay | Admitting: Emergency Medicine

## 2021-05-13 DIAGNOSIS — R0600 Dyspnea, unspecified: Secondary | ICD-10-CM | POA: Diagnosis not present

## 2021-05-13 DIAGNOSIS — Z9989 Dependence on other enabling machines and devices: Secondary | ICD-10-CM

## 2021-05-13 DIAGNOSIS — R042 Hemoptysis: Secondary | ICD-10-CM

## 2021-05-13 DIAGNOSIS — R0609 Other forms of dyspnea: Secondary | ICD-10-CM

## 2021-05-13 DIAGNOSIS — G4733 Obstructive sleep apnea (adult) (pediatric): Secondary | ICD-10-CM | POA: Diagnosis not present

## 2021-05-13 LAB — PULMONARY FUNCTION TEST
DL/VA % pred: 136 %
DL/VA: 5.59 ml/min/mmHg/L
DLCO cor % pred: 78 %
DLCO cor: 22.13 ml/min/mmHg
DLCO unc % pred: 76 %
DLCO unc: 21.48 ml/min/mmHg
FEF 25-75 Post: 1.44 L/sec
FEF 25-75 Pre: 2.39 L/sec
FEF2575-%Change-Post: -39 %
FEF2575-%Pred-Post: 50 %
FEF2575-%Pred-Pre: 83 %
FEV1-%Change-Post: -6 %
FEV1-%Pred-Post: 73 %
FEV1-%Pred-Pre: 78 %
FEV1-Post: 2.38 L
FEV1-Pre: 2.55 L
FEV1FVC-%Change-Post: 3 %
FEV1FVC-%Pred-Pre: 101 %
FEV6-%Change-Post: -8 %
FEV6-%Pred-Post: 72 %
FEV6-%Pred-Pre: 79 %
FEV6-Post: 2.94 L
FEV6-Pre: 3.21 L
FEV6FVC-%Change-Post: 0 %
FEV6FVC-%Pred-Post: 104 %
FEV6FVC-%Pred-Pre: 104 %
FVC-%Change-Post: -9 %
FVC-%Pred-Post: 69 %
FVC-%Pred-Pre: 77 %
FVC-Post: 2.94 L
FVC-Pre: 3.27 L
Post FEV1/FVC ratio: 81 %
Post FEV6/FVC ratio: 100 %
Pre FEV1/FVC ratio: 78 %
Pre FEV6/FVC Ratio: 100 %
RV % pred: 117 %
RV: 2.91 L
TLC % pred: 79 %
TLC: 5.87 L

## 2021-05-13 NOTE — Assessment & Plan Note (Signed)
Tolerating CPAP.  Has good clinical benefit.  Good compliance.  He follows with neurology.

## 2021-05-13 NOTE — Assessment & Plan Note (Signed)
Has resolved.  He is tolerating his anticoagulation.  Reassuring ENT eval and CT chest.  No indication for bronchoscopy at this time.  If he has a recurrence then we will need to reconsider.

## 2021-05-13 NOTE — Progress Notes (Signed)
Subjective:    Patient ID: IZEN PETZ, male    DOB: [birth year], 65 y.o.   MRN: [id number]  HPI 65 year old gentleman, former smoker (2-3 pack years) with CAD/CABG, AVR (2011) and atrial fibrillation on anticoagulation and prophylactic doxycycline, diabetes, hyperlipidemia, GERD, OSA on CPAP.   He is referred today for evaluation of hemoptysis.  He reports he began to see some intermittent blood mixed with mucous about 3 months ago.  Seemed to be at times when his overall mucous burden was higher - sometimes yellow. Also seemed to be associated with exertion - after he played pickleball. It was happening every few days initially. He hasn't seen any blood in 2-3 weeks now. He was seen by ENT and no UA abnormality was seen. He does not recall any blood from his nose or sinuses. Remains on Xarelto. He is reliable w CPAP, doesn't always use humidity.  He has noticed some exertional SOB since about 2 months ago, used to play pickleball frequently, but his activity has dropped off since, seems that his breathing has been limiting him. There are plans for L and R heart cath next week.   He was hospitalized in April 2022 with near syncope.  Has also been found to have transaminitis, followed by gastroenterology  CT chest performed 12/14/2020 reviewed by me, showed no evidence of mass, no PE.  There was some bibasilar scar.  Scattered subcentimeter mediastinal lymph nodes that do not reach pathological criteria.  No evidence of endobronchial lesion.  ROV 05/13/21 --follow-up visit 65 year old gentleman with a minimal tobacco history, CAD/CABG, AVR and A. fib on anticoagulation and prophylactic doxycycline.  Also with a history of diabetes, hyperlipidemia, OSA on CPAP.  I saw him in for hemoptysis (on Xarelto).  Scant hemoptysis with a reassuring ENT evaluation, CT chest.  He was also having exertional shortness of breath which prompted pulmonary function testing as below.  Today he reports that his  breathing continues to limit him some, especially when walking quickly. No associated pain. No more hemoptysis since our last visit. Recently started on jardiance, considering starting entresto. He is compliant with his CPAP, gets good clinical benefit, follows with Neurology.   Since I last saw him he was admitted early July for decompensated diastolic CHF in [place]. His diuretics were increased.   Left and right heart cath was performed 03/18/2021, showed 50% stenosed proximal LAD lesion, 90% stenosis mid LAD lesion with a patent LIMA.  Mild disease in the circumflex and RCA.  Pulmonary pressure 50 mmHg, mean wedge 5.  LV dysfunction, EF 45%.  Pulmonary function testing performed today reviewed by me shows evidence for restriction, consider possible mild mixed disease without a bronchodilator response.  Lung volumes show mild restriction.  Diffusion capacity is decreased and corrects to the normal range when adjusted for alveolar volume.   Review of Systems As per HPI  Past Medical History:  Diagnosis Date   Atrial fibrillation ([place])    Coronary artery disease    Diabetes mellitus without complication (HCC)    Diabetic polyneuropathy (HCC)    GERD (gastroesophageal reflux disease)    Gout, unspecified    HLD (hyperlipidemia)    Hx of adenomatous polyp of colon 12/03/2020   Hypertension    OSA on CPAP    Sleep apnea      Family History  Problem Relation Age of Onset   Congenital heart disease Mother    Diabetes Mother    Hypertension Mother    Heart disease  Mother    Heart attack Father    Cervical cancer Sister    Diabetes Sister    Hypertension Brother    Diabetes Brother    Pancreatic cancer Brother    Diabetes Brother    Breast cancer Sister    Diabetes Sister    Diabetes Sister    Diabetes Sister    Diabetes Sister    Diabetes Son    Colon cancer Neg Hx    Colon polyps Neg Hx    Esophageal cancer Neg Hx    Stomach cancer Neg Hx      Social History    Socioeconomic History   Marital status: Married    Spouse name: Not on file   Number of children: 2   Years of education: Not on file   Highest education level: Not on file  Occupational History   Occupation: retired  Tobacco Use   Smoking status: Former    Packs/day: 0.25    Years: 10.00    Pack years: 2.50    Types: Cigarettes    Quit date: 11/03/1981    Years since quitting: 39.5   Smokeless tobacco: Never  Vaping Use   Vaping Use: Never used  Substance and Sexual Activity   Alcohol use: Yes    Comment: occasional   Drug use: No   Sexual activity: Not on file  Other Topics Concern   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   Married 1 son 1 daughter   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   Other jobs including working at the [employer], providing health care for disabled, 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  No hx TB or exposure. Never a positive PPD.   Allergies  Allergen Reactions   Lipitor [Atorvastatin] Other (See Comments)    Body aches   Penicillins Swelling    Swollen face     Outpatient Medications Prior to Visit  Medication Sig Dispense Refill   allopurinol (ZYLOPRIM) 300 MG tablet Take 300 mg by mouth daily.     bisoprolol (ZEBETA) 5 MG tablet Take 0.5 tablets (2.5 mg total) by mouth daily. 15 tablet 11   empagliflozin  (JARDIANCE) 10 MG TABS tablet Take 1 tablet (10 mg total) by mouth daily before breakfast. 90 tablet 1   furosemide (LASIX) 40 MG tablet Take 1 tablet (40 mg total) by mouth daily. 90 tablet 2   magnesium oxide (MAG-OX) 400 MG tablet Take 400 mg by mouth daily.     metFORMIN (GLUCOPHAGE) 500 MG tablet Take 1 tablet (500 mg total) by mouth 2 (two) times daily with a meal.     Omega-3 Fatty Acids (FISH OIL) 1200 MG CPDR Take 1,200 mg by mouth daily.     potassium chloride (MICRO-K) 10 MEQ CR capsule Take 20  mEq by mouth daily.     rosuvastatin (CRESTOR) 5 MG tablet Take 5 mg by mouth every evening.     VITAMIN D PO Take 1 capsule by mouth daily.     XARELTO 20 MG TABS tablet Take 1 tablet (20 mg total) by mouth every evening. 90 tablet 3   No facility-administered medications prior to visit.        Objective:   Physical Exam Vitals:   05/13/21 1616  BP: 112/76  Pulse: 64  Temp: 97.9 F (36.6 C)  TempSrc: Oral  SpO2: 98%  Weight: 200 lb (90.7 kg)  Height: _0  (1.854 m)   Gen: Pleasant, well-nourished, in no distress,  normal affect  ENT: No lesions,  mouth clear,  oropharynx clear, no postnasal drip  Neck: No JVD, no stridor  Lungs: No use of accessory muscles, no crackles or wheezing on normal respiration, no wheeze on forced expiration  Cardiovascular: RRR, systolic M  Musculoskeletal: No deformities, no cyanosis or clubbing  Neuro: alert, awake, non focal  Skin: Warm, no lesions or rash      Assessment & Plan:  DOE (dyspnea on exertion) Evidence for restriction on spirometry and his lung volumes.  No clear evidence for obstruction.  I think his dyspnea is largely due to his cardiac dysfunction and also some deconditioning.  No clear evidence for obstructive lung disease or to support bronchodilators.  Hemoptysis Has resolved.  He is tolerating his anticoagulation.  Reassuring ENT eval and CT chest.  No indication for bronchoscopy at this time.  If he has a  recurrence then we will need to reconsider.  OSA on CPAP Tolerating CPAP.  Has good clinical benefit.  Good compliance.  He follows with neurology.   Time spent 51 minutes  Baltazar Apo, MD, PhD 05/13/2021, 5:14 PM [facility] [phone] or if no answer before 7:00PM call [phone] For any issues after 7:00PM please call eLink [phone]

## 2021-05-13 NOTE — Patient Instructions (Signed)
Your pulmonary function testing today was reviewed and is reassuring.  There may be some evidence for some mild restriction on the sides of each breath that you take.  No clear evidence for lung disease. Continue to follow with cardiology as planned Follow-up with neurology for your CPAP and to manage your sleep apnea. Please follow Dr. Lamonte Sakai if you have any more cough with bleeding.

## 2021-05-13 NOTE — Assessment & Plan Note (Signed)
Evidence for restriction on spirometry and his lung volumes.  No clear evidence for obstruction.  I think his dyspnea is largely due to his cardiac dysfunction and also some deconditioning.  No clear evidence for obstructive lung disease or to support bronchodilators.

## 2021-05-17 ENCOUNTER — Telehealth: Payer: Self-pay | Admitting: Interventional Cardiology

## 2021-05-17 DIAGNOSIS — I5032 Chronic diastolic (congestive) heart failure: Secondary | ICD-10-CM

## 2021-05-17 DIAGNOSIS — R0602 Shortness of breath: Secondary | ICD-10-CM

## 2021-05-17 NOTE — Telephone Encounter (Signed)
Pt called to report that he has been experiencing increased dyspnea with minimal exertion worsening over the last few weeks.. he saw Pulmonary recently:   Your pulmonary function testing today was reviewed and is reassuring.  There may be some evidence for some mild restriction on the sides of each breath that you take.  No clear evidence for lung disease. Continue to follow with cardiology as planned  Pt denies peripheral edema... he cannot walk 1/2 a block without having to stop and catch his breath and gain the stamina to walk further.   He says that he can still lay flat at night but he sleeps with his CPAP.   He gets a little light headed when he rises to fast.   No cough but has had some phlegm production recently that is clear and clears away easily.   He has had to get a wheel chair to assist him when traveling in the airport.   He also reports that he is loosing weight without trying.. he has lost 15-20 lbs over the passed month and has still been eating as he normally does.   I have asked him to call his PCP, Dr. Forde Dandy for a full Physical.   He has an appt with A Tillery PA 07/06/21 but I will need to forward this message to Dr. Irish Lack for review.   He kept a record of this BP for the Pharmacist but he did not record the time he took it but usually checked it in the morning but it was usually sporadic:   Mon 7/18    129/81 HR 71 Tues 7/19   118/71 HR 69 Wed 7/20    117/80 HR 55 Thur 7/21    144/91 HR 75 Fri 7/22       142/96 HR 67 Sat  7/23     118/84 HR 64  and 132/80 Sun 7/24     123/82 and 110/79 Mon 7/25    143/90 HR 59   Will forward to Dr. Irish Lack and to the HTN clinic.

## 2021-05-17 NOTE — Telephone Encounter (Signed)
OK to increase Lasix to 40 mg BID x 4 days to see if there is improvement in shortness of breath. After 4 days, can go back to 40 mg daily.  BMet, BNP next week.  JV

## 2021-05-17 NOTE — Telephone Encounter (Signed)
Pt c/o Shortness Of Breath: STAT if SOB developed within the last 24 hours or pt is noticeably SOB on the phone  1. Are you currently SOB (can you hear that pt is SOB on the phone)? No / He stated with a little bit of walking he will have some SOB   2. How long have you been experiencing SOB? A couple of weeks   3. Are you SOB when sitting or when up moving around? Pt stated only when  up and moving and normally pretty fast   4. Are you currently experiencing any other symptoms? A little lightness when he get up   143/90 last bp reading  Hr - 59  Best number [phone]

## 2021-05-17 NOTE — Telephone Encounter (Signed)
I spoke with patient and gave him instructions from Dr Irish Lack.  Patient will come in for lab work on May 25, 2021  He will be out of town from August 4-August 8.   Patient will let us know if shortness of breath does not improve. Dr Irish Lack would like to see patient back in mid August.  Appointment scheduled for August 22 at 10:00

## 2021-05-25 ENCOUNTER — Ambulatory Visit (INDEPENDENT_AMBULATORY_CARE_PROVIDER_SITE_OTHER): Payer: Medicare Other | Admitting: Podiatry

## 2021-05-25 ENCOUNTER — Other Ambulatory Visit: Payer: Medicare Other | Admitting: *Deleted

## 2021-05-25 ENCOUNTER — Encounter: Payer: Self-pay | Admitting: Podiatry

## 2021-05-25 ENCOUNTER — Other Ambulatory Visit: Payer: Self-pay

## 2021-05-25 DIAGNOSIS — R932 Abnormal findings on diagnostic imaging of liver and biliary tract: Secondary | ICD-10-CM | POA: Insufficient documentation

## 2021-05-25 DIAGNOSIS — R634 Abnormal weight loss: Secondary | ICD-10-CM | POA: Insufficient documentation

## 2021-05-25 DIAGNOSIS — E1142 Type 2 diabetes mellitus with diabetic polyneuropathy: Secondary | ICD-10-CM

## 2021-05-25 DIAGNOSIS — M79674 Pain in right toe(s): Secondary | ICD-10-CM

## 2021-05-25 DIAGNOSIS — I255 Ischemic cardiomyopathy: Secondary | ICD-10-CM | POA: Insufficient documentation

## 2021-05-25 DIAGNOSIS — N401 Enlarged prostate with lower urinary tract symptoms: Secondary | ICD-10-CM | POA: Insufficient documentation

## 2021-05-25 DIAGNOSIS — I5189 Other ill-defined heart diseases: Secondary | ICD-10-CM | POA: Insufficient documentation

## 2021-05-25 DIAGNOSIS — R0602 Shortness of breath: Secondary | ICD-10-CM

## 2021-05-25 DIAGNOSIS — B351 Tinea unguium: Secondary | ICD-10-CM | POA: Diagnosis not present

## 2021-05-25 DIAGNOSIS — Z7901 Long term (current) use of anticoagulants: Secondary | ICD-10-CM | POA: Insufficient documentation

## 2021-05-25 DIAGNOSIS — M79675 Pain in left toe(s): Secondary | ICD-10-CM

## 2021-05-25 DIAGNOSIS — I5032 Chronic diastolic (congestive) heart failure: Secondary | ICD-10-CM

## 2021-05-26 ENCOUNTER — Telehealth: Payer: Self-pay | Admitting: *Deleted

## 2021-05-26 LAB — BASIC METABOLIC PANEL
BUN/Creatinine Ratio: 21 (ref 10–24)
BUN: 29 mg/dL — ABNORMAL HIGH (ref 8–27)
CO2: 20 mmol/L (ref 20–29)
Calcium: 10.6 mg/dL — ABNORMAL HIGH (ref 8.6–10.2)
Chloride: 97 mmol/L (ref 96–106)
Creatinine, Ser: 1.41 mg/dL — ABNORMAL HIGH (ref 0.76–1.27)
Glucose: 112 mg/dL — ABNORMAL HIGH (ref 65–99)
Potassium: 4.8 mmol/L (ref 3.5–5.2)
Sodium: 138 mmol/L (ref 134–144)
eGFR: 55 mL/min/{1.73_m2} — ABNORMAL LOW (ref >59)

## 2021-05-26 LAB — PRO B NATRIURETIC PEPTIDE: NT-Pro BNP: 1236 pg/mL — ABNORMAL HIGH (ref 0–376)

## 2021-05-26 NOTE — Telephone Encounter (Signed)
Reviewed with Dr Angelena Form (office DOD) and patient should increase furosemide to 40 mg twice daily for 4 days and then return to normal dose of 40 mg daily.  I placed call to patient and left message to call office

## 2021-05-26 NOTE — Telephone Encounter (Signed)
Patient notified

## 2021-05-26 NOTE — Telephone Encounter (Signed)
BNP 1236.  I spoke with patient. He took increased lasix dose 7/26-7/29.  He reports shortness of breath improved some but he is not 100%.  He still feels a little shortness of breath when moving quickly. Does feel a little dizzy when changing positions at times.

## 2021-05-29 NOTE — Progress Notes (Signed)
  Subjective:  Patient ID: Frank Berg, male    DOB: May 17, 1956,  MRN: [id number]  JAHSEAN CAIL presents to clinic today for painful thick toenails that are difficult to trim. Pain interferes with ambulation. Aggravating factors include wearing enclosed shoe gear. Pain is relieved with periodic professional debridement.  Patient states he attempted to pick at his right 4th toenail because he felt it was growing in.  Patient states blood glucose was 127 mg/dl today.  PCP is [name], MD , and last visit was 02/23/2021.  Allergies  Allergen Reactions   Lipitor [Atorvastatin] Other (See Comments)    Body aches   Penicillins Swelling    Swollen face    Review of Systems: Negative except as noted in the HPI. Objective:   Constitutional Frank Berg is a pleasant 65 y.o. African American male, in NAD. AAO x 3.   Vascular Capillary refill time to digits immediate b/l. Palpable pedal pulses b/l LE. Pedal hair absent. Lower extremity skin temperature gradient within normal limits. No pain with calf compression b/l. No edema noted b/l lower extremities. No cyanosis or clubbing noted.  Neurologic Normal speech. Oriented to person, place, and time. Pt has subjective symptoms of neuropathy. Protective sensation intact 5/5 intact bilaterally with 10g monofilament b/l.  Dermatologic Pedal skin with normal turgor, texture and tone b/l lower extremities. Toenails 1-5 left, R hallux, R 2nd toe, R 4th toe, and R 5th toe elongated, discolored, dystrophic, thickened, and crumbly with subungual debris and tenderness to dorsal palpation. Anonychia noted R 3rd toe. Nailbed(s) epithelialized.  Dried heme present onright 4th digit..  Orthopedic: Normal muscle strength 5/5 to all lower extremity muscle groups bilaterally. Hammertoe(s) noted to the 2-5 bilaterally.   Radiographs: None Assessment:   1. Pain due to onychomycosis of toenails of both feet   2. Diabetic peripheral neuropathy  associated with type 2 diabetes mellitus (HCC)    Plan:  -For injury to right 4th toe, patient instructed to apply Neosporin to right 4th digit once daily for 14 days. . -Continue diabetic foot care principles. -Patient to continue soft, supportive shoe gear daily. -Toenails 1-5 left, R hallux, R 2nd toe, R 4th toe, and R 5th toe debrided in length and girth without iatrogenic bleeding with sterile nail nipper and dremel.  -Patient to report any pedal injuries to medical professional immediately. -Patient/POA to call should there be question/concern in the interim.  Return in about 3 months (around 08/25/2021).  Marzetta Board, DPM

## 2021-06-07 ENCOUNTER — Telehealth: Payer: Self-pay | Admitting: *Deleted

## 2021-06-07 NOTE — Telephone Encounter (Signed)
Pt is returning call.  

## 2021-06-07 NOTE — Telephone Encounter (Signed)
See phone note from 8/3.  Patient was returning this call.  He only increased lasix for 2 days because on 8/5 he was having increased frequent urination and irritation on penis.  Was evaluated in 68 office.  Since then shortness of breath is OK.  Only occurs every now and then.  Patient is seeing Dr Irish Lack on 8/22.  I told him he could take one extra lasix daily if needed prior to this appointment.  He will let us know if he needs to take frequently. Patient will bring BP readings from home to next appointment.

## 2021-06-07 NOTE — Telephone Encounter (Signed)
Hammer, Angeline S routed conversation to [employer] Triage 10 minutes ago (9:12 AM)   Esmeralda Links, Angeline S 10 minutes ago (9:12 AM)   AH     Pt is returning call      Note   Per Angeline - pt is returning a call to [facility] from 2 weeks ago.

## 2021-06-13 NOTE — Progress Notes (Signed)
Cardiology Office Note   Date:  06/14/2021   ID:  Frank Berg, MRN [id number]  PCP:  [name], MD    No chief complaint on file.  AVR, CAD  Wt Readings from Last 3 Encounters:  06/14/21 197 lb 6.4 oz (89.5 kg)  05/13/21 200 lb (90.7 kg)  04/29/21 205 lb (93 kg)       History of Present Illness: Frank Berg is a 65 y.o. male   with a history of aortic valve replacement in 2011.   He had a heart murmur diagnosed at age 52.  Presumably, this was from a bicuspid valve.     In 2011, he had an AVR at age 68.  It was a bovine valve.  He takes SBE prophylaxis with doxycycline.  He had a stent placed in 2008.  He had a one vessel CABG at the time of valve replacement.  Chest burning was anginal sx.   He had AFib and subsequently had an AFib ablation in 12/17.  No recurrences of the AFib.    He moved to [facility] from [place] to be in a warmer place with lower taxes.   He has been on Xarelto since that time.  He wanted to come off Xarelto after ablation.   His sx with AFib were DOE.  He could not sense palpitatons.  He took amiodarone and metoprolol for a short time before the ablation.   Note from [facility] 2018, Dr. Lubertha Basque states: "S/P cryoablation of arrhythmia  10/07/16: Cryoablation/PVI and RFA of baseline AFLutter Ablated to sinus Then transseptal to LA Isolated PVs    Mr. Gaiser is doing very well after ablation for atrial flutter and fibrillation, maintaining sinus rhythm. He had his amiodarone stopped, and continues to maintain sinus.    We also discussed anticoagulation it is unclear as to whether is ever safe to come off anticoagulation when the risk score suggests a patient with atrial fibrillation should be on, and since he has only been off amiodarone for a month or 2 we will keep him on anticoagulation for the short term. Maybe at the year mark if he has no more atrial fibrillation we can contemplate coming off if he is willing to  accept the risk of recurrent, asymptomatic atrial fibrillation."   He saw Dr. Rayann Heman in 2019 regarding stopping anticoagulation: "We discussed that AHA/ACC/HRS guidelines would say that he should continue long term anticoagulation.  We also discussed that there is in the guidelines allowance for monitoring post ablation with an implantable loop recorder.  Risks and benefits of ILR were discussed at length today.  I think that this would be a good option for him.  If he has no afib on ILR then we could stop anticoagualtion and then resume if ever AF was detected."   He decided not to have ILR placed.    His son got sick with COVID in 2020 and was in the ICU.  Son recovered after being intubated.   In 01/2020, he had ortho issues that have limited walking.  He had dizziness with change in positions.     Hospitalized in 01/2021 with near syncope: "On presentation he was hemodynamically stable.  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  Troponins were mildly elevated.  BNP elevated at 299.  Patient was admitted for the work-up of near syncope. Cardiology was also consulted after admission because of elevated troponin.  He did not complain of  any chest pain during this hospitalization.  Echocardiogram showed ejection fraction of 45%, global hypokinesis, moderate left ventricular hypertrophy, grade 2 diastolic's dysfunction, right ventricular enlargement.  Cardiology recommended outpatient follow-up and consideration of right / left heart catheterization for further evaluation which will be done as an outpatient . Venous Doppler was negative for DVT.  He does not look volume overloaded. He  was complaining of intermittent hemoptysis.  He has an appointment with pulmonology next week.  Chest x-ray done during this hospitalization did not show any acute problems."   Exercise was Tai Chi and line dancing.  Cath in 02/2021 showed: "Prox LAD lesion is 50% stenosed. Mid LAD  lesion is 90% stenosed. Patent LIMA to LAD. Circumflex and RCA appear patent with mild disease. Ao sat 99%, PA sat 56% (repeat value was 60%); PA pressure 15 mm Hg; mean PCWP 5 mm Hg; CO 3.4 L/min; CI 1.6   Medical therapy for LV dysfunction.  Will consider referral to CHF clinic. To optimize his meds.  BP was somewhat low today, 97/60, which limits titration of his meds."  Saw PharmD in 04/2021 with plan of : "BP at goal <130/80 mmHg, actually on the soft end today despite telmisartan d/c at [facility], pt didn't start valsartan at discharge. Hoping to start [facility] but worried about hypotension. Checking BMET today with recent Jardiance start. Will plan to continue Jardiance 99m daily, bisoprolol 2.523mdaily (HR upper 50s), and furosemide 4078maily.    He has lost weight due to the stress of a divorce. He has not been eating regularly.   Denies : Chest pain. Dizziness. Leg edema. Nitroglycerin use. Orthopnea. Palpitations. Paroxysmal nocturnal dyspnea. Shortness of breath. Syncope.    He  goes to the gym and does Tai Chi and line dancing. Used to play pickle ball.     Past Medical History:  Diagnosis Date   [place]rial [place] ([place]  Coronary artery disease    Diabetes mellitus without complication (HCC)    Diabetic polyneuropathy (HCC)    GERD (gastroesophageal reflux disease)    Gout, unspecified    HLD (hyperlipidemia)    Hx of adenomatous polyp of colon 12/03/2020   Hypertension    OSA on CPAP    Sleep apnea     Past Surgical History:  Procedure Laterality Date   ABLATION     AORTIC VALVE REPLACEMENT  2011   CERVICAL DISC SURGERY     spinal stenosis   COLONOSCOPY     ESOPHAGOGASTRODUODENOSCOPY     RIGHT/LEFT HEART CATH AND CORONARY/GRAFT ANGIOGRAPHY N/A 03/18/2021   Procedure: RIGHT/LEFT HEART CATH AND CORONARY/GRAFT ANGIOGRAPHY;  Surgeon: VarJettie BoozeD;  Location: [facility];  Service: Cardiovascular;  Laterality: N/A;   TONSILLECTOMY       Current  Outpatient Medications  Medication Sig Dispense Refill   allopurinol (ZYLOPRIM) 300 MG tablet Take 300 mg by mouth daily.     allopurinol (ZYLOPRIM) 300 MG tablet Take by mouth.     bisoprolol (ZEBETA) 5 MG tablet Take 0.5 tablets (2.5 mg total) by mouth daily. 15 tablet 11   colchicine 0.6 MG tablet Take by mouth.     empagliflozin (JARDIANCE) 10 MG TABS tablet Take 1 tablet (10 mg total) by mouth daily before breakfast. 90 tablet 1   furosemide (LASIX) 40 MG tablet Take 1 tablet (40 mg total) by mouth daily. 90 tablet 2   glucose blood (ONETOUCH VERIO) test strip TEST ONCE A DAY DX E11.29  magnesium oxide (MAG-OX) 400 MG tablet Take 400 mg by mouth daily.     metFORMIN (GLUCOPHAGE) 500 MG tablet Take 1 tablet (500 mg total) by mouth 2 (two) times daily with a meal.     Omega-3 Fatty Acids (FISH OIL) 1200 MG CPDR Take 1,200 mg by mouth daily.     potassium chloride (KLOR-CON) 20 MEQ packet Take 10 mEq by mouth 2 (two) times daily.     rosuvastatin (CRESTOR) 5 MG tablet Take 5 mg by mouth every evening.     tadalafil, PAH, (ADCIRCA) 20 MG tablet Take 20 mg by mouth daily as needed.     triamcinolone cream (KENALOG) 0.1 % SMARTSIG:Sparingly Topical Twice Daily     VITAMIN D PO Take 1 capsule by mouth daily.     XARELTO 20 MG TABS tablet Take 1 tablet (20 mg total) by mouth every evening. 90 tablet 3   No current facility-administered medications for this visit.    Allergies:   Lipitor [atorvastatin] and Penicillins    Social History:  The patient  reports that he quit smoking about 39 years ago. His smoking use included cigarettes. He has a 2.50 pack-year smoking history. He has never used smokeless tobacco. He reports current alcohol use. He reports that he does not use drugs.   Family History:  The patient's family history includes Breast cancer in his sister; Cervical cancer in his sister; Congenital heart disease in his mother; Diabetes in his brother, brother, mother, sister,  sister, sister, sister, sister, and son; Heart attack in his father; Heart disease in his mother; Hypertension in his brother and mother; Pancreatic cancer in his brother.    ROS:  Please see the history of present illness.   Otherwise, review of systems are positive for mild dizziness with standing.   All other systems are reviewed and negative.    PHYSICAL EXAM: VS:  BP (!) 98/58   Pulse 69   Ht _0  (1.854 m)   Wt 197 lb 6.4 oz (89.5 kg)   SpO2 99%   BMI 26.04 kg/m  , BMI Body mass index is 26.04 kg/m. GEN: Well nourished, well developed, in no acute distress HEENT: normal Neck: no JVD, carotid bruits, or masses Cardiac: RRR; 2/6 systolic murmur, no rubs, or gallops,no edema  Respiratory:  clear to auscultation bilaterally, normal work of breathing GI: soft, nontender, nondistended, + BS MS: no deformity or atrophy Skin: warm and dry, no rash Neuro:  Strength and sensation are intact Psych: euthymic mood, full affect    Recent Labs: 02/12/2021: B Natriuretic Peptide 199.6; Magnesium 1.7 04/01/2021: ALT 16; Hemoglobin 15.3; Platelets 246 04/07/2021: TSH 3.600 05/25/2021: BUN 29; Creatinine, Ser 1.41; NT-Pro BNP 1,236; Potassium 4.8; Sodium 138   Lipid Panel No results found for: CHOL, TRIG, HDL, CHOLHDL, VLDL, LDLCALC, LDLDIRECT   Other studies Reviewed: Additional studies/ records that were reviewed today with results demonstrating: LVEF 45%.   ASSESSMENT AND PLAN:  AVR: Continue SBE prophylaxis.  Appears euvolemic.  Significant weight loss in the last few months.  He thinks this is due to stress. AFlutter/AFib: He will be seeing his EP MD next week at [facility].  no issues with arrhythmia of late. Anticoagulated: No bleeding problems.  Watch for any bleeding problems. CAD: No angina. Mildly decreased LVEF.  OK to decrease furosemide to 20 mg 1-2 days of the week and see if his thirst improves and if his orthostasis improves.  If he tolerates then lower dose of Lasix, he  could  continue to try to decrease the dose on his own.  If shortness of breath increases or if he has increased swelling, he will have to go back to the higher dose.   Current medicines are reviewed at length with the patient today.  The patient concerns regarding his medicines were addressed.  The following changes have been made: As above  Labs/ tests ordered today include:  No orders of the defined types were placed in this encounter.   Recommend 150 minutes/week of aerobic exercise Low fat, low carb, high fiber diet recommended  Disposition:   FU in 4 months   Signed, Larae Grooms, MD  06/14/2021 10:26 AM    [facility] [place], [place], [place]  [address] Phone: [phone]; Fax: [phone]

## 2021-06-14 ENCOUNTER — Encounter: Payer: Self-pay | Admitting: Interventional Cardiology

## 2021-06-14 ENCOUNTER — Ambulatory Visit (INDEPENDENT_AMBULATORY_CARE_PROVIDER_SITE_OTHER): Payer: Medicare Other | Admitting: Interventional Cardiology

## 2021-06-14 ENCOUNTER — Other Ambulatory Visit: Payer: Self-pay

## 2021-06-14 VITALS — BP 98/58 | HR 69 | Ht 73.0 in | Wt 197.4 lb

## 2021-06-14 DIAGNOSIS — I480 Paroxysmal atrial fibrillation: Secondary | ICD-10-CM

## 2021-06-14 DIAGNOSIS — Z7901 Long term (current) use of anticoagulants: Secondary | ICD-10-CM | POA: Diagnosis not present

## 2021-06-14 DIAGNOSIS — Z952 Presence of prosthetic heart valve: Secondary | ICD-10-CM

## 2021-06-14 DIAGNOSIS — R634 Abnormal weight loss: Secondary | ICD-10-CM

## 2021-06-14 DIAGNOSIS — I25119 Atherosclerotic heart disease of native coronary artery with unspecified angina pectoris: Secondary | ICD-10-CM

## 2021-06-14 NOTE — Patient Instructions (Signed)
Medication Instructions:  May take lasix 20 mg or 40 mg each day based on your BP and how you are feeling.   *If you need a refill on your cardiac medications before your next appointment, please call your pharmacy*   Lab Work: none If you have labs (blood work) drawn today and your tests are completely normal, you will receive your results only by: [place] (if you have MyChart) OR A paper copy in the mail If you have any lab test that is abnormal or we need to change your treatment, we will call you to review the results.   Testing/Procedures: none   Follow-Up: At [facility], you and your health needs are our priority.  As part of our continuing mission to provide you with exceptional heart care, we have created designated Provider Care Teams.  These Care Teams include your primary Cardiologist (physician) and Advanced Practice Providers (APPs -  Physician Assistants and Nurse Practitioners) who all work together to provide you with the care you need, when you need it.  We recommend signing up for the patient portal called "MyChart".  Sign up information is provided on this After Visit Summary.  MyChart is used to connect with patients for Virtual Visits (Telemedicine).  Patients are able to view lab/test results, encounter notes, upcoming appointments, etc.  Non-urgent messages can be sent to your provider as well.   To learn more about what you can do with MyChart, go to [email or web address].    Your next appointment:   4 month(s)  The format for your next appointment:   In Person  Provider:   Casandra Doffing, MD   Other Instructions

## 2021-07-05 NOTE — Progress Notes (Signed)
PCP:  [name], MD Primary Cardiologist: Larae Grooms, MD Electrophysiologist: [name], MD   Frank Berg is a 65 y.o. male seen today for [name], MD for routine electrophysiology followup.  Since last being seen in our clinic the patient reports doing about the same. He has noticed more SOB over the past week, and at times a chest heaviness along with it. This in the setting of cutting his lasix back to only 20 mg daily. He bumped it back up to 40 mg daily for the past 2 days and possible having mild improvement. Palpitations overall controlled.  No syncope  Past Medical History:  Diagnosis Date   Atrial fibrillation ([place])    Coronary artery disease    Diabetes mellitus without complication (HCC)    Diabetic polyneuropathy (HCC)    GERD (gastroesophageal reflux disease)    Gout, unspecified    HLD (hyperlipidemia)    Hx of adenomatous polyp of colon 12/03/2020   Hypertension    OSA on CPAP    Sleep apnea    Past Surgical History:  Procedure Laterality Date   ABLATION     AORTIC VALVE REPLACEMENT  2011   CERVICAL DISC SURGERY     spinal stenosis   COLONOSCOPY     ESOPHAGOGASTRODUODENOSCOPY     RIGHT/LEFT HEART CATH AND CORONARY/GRAFT ANGIOGRAPHY N/A 03/18/2021   Procedure: RIGHT/LEFT HEART CATH AND CORONARY/GRAFT ANGIOGRAPHY;  Surgeon: Jettie Booze, MD;  Location: [facility];  Service: Cardiovascular;  Laterality: N/A;   TONSILLECTOMY      Current Outpatient Medications  Medication Sig Dispense Refill   allopurinol (ZYLOPRIM) 300 MG tablet Take 300 mg by mouth daily.     bisoprolol (ZEBETA) 5 MG tablet Take 0.5 tablets (2.5 mg total) by mouth daily. 15 tablet 11   clotrimazole-betamethasone (LOTRISONE) cream SMARTSIG:Sparingly Topical Twice Daily     colchicine 0.6 MG tablet Take by mouth.     empagliflozin (JARDIANCE) 10 MG TABS tablet Take 1 tablet (10 mg total) by mouth daily before breakfast. 90 tablet 1   furosemide (LASIX) 40  MG tablet Take 1 tablet (40 mg total) by mouth daily. 90 tablet 2   glucose blood (ONETOUCH VERIO) test strip TEST ONCE A DAY DX E11.29     magnesium oxide (MAG-OX) 400 MG tablet Take 400 mg by mouth daily.     metFORMIN (GLUCOPHAGE) 500 MG tablet Take 1 tablet (500 mg total) by mouth 2 (two) times daily with a meal.     Omega-3 Fatty Acids (FISH OIL) 1200 MG CPDR Take 1,200 mg by mouth daily.     potassium chloride (KLOR-CON) 20 MEQ packet Take 10 mEq by mouth 2 (two) times daily.     rosuvastatin (CRESTOR) 5 MG tablet Take 5 mg by mouth every evening.     tadalafil, PAH, (ADCIRCA) 20 MG tablet Take 20 mg by mouth daily as needed.     triamcinolone cream (KENALOG) 0.1 % SMARTSIG:Sparingly Topical Twice Daily     VITAMIN D PO Take 1 capsule by mouth daily.     XARELTO 20 MG TABS tablet Take 1 tablet (20 mg total) by mouth every evening. 90 tablet 3   No current facility-administered medications for this visit.    Allergies  Allergen Reactions   Lipitor [Atorvastatin] Other (See Comments)    Body aches   Penicillins Swelling    Swollen face    Social History   Socioeconomic History   Marital status: Married  Spouse name: Not on file   Number of children: 2   Years of education: Not on file   Highest education level: Not on file  Occupational History   Occupation: retired  Tobacco Use   Smoking status: Former    Packs/day: 0.25    Years: 10.00    Pack years: 2.50    Types: Cigarettes    Quit date: 11/03/1981    Years since quitting: 39.6   Smokeless tobacco: Never  Vaping Use   Vaping Use: Never used  Substance and Sexual Activity   Alcohol use: Yes    Comment: occasional   Drug use: No   Sexual activity: Not on file  Other Topics Concern   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 to 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 2017   Married 1 son 1 daughter   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  Physical Exam: Vitals:   07/06/21 0909  BP: 98/72  Pulse: (!) 55  SpO2: 99%  Weight: 201 lb 6.4 oz (91.4 kg)  Height: _0  (1.854 m)    GEN- The patient is well appearing, alert and oriented x 3 today.   HEENT: normocephalic, atraumatic; sclera clear, conjunctiva pink; hearing intact; oropharynx clear; neck supple, no JVP Lymph- no cervical lymphadenopathy Lungs- Clear to ausculation bilaterally, normal work of breathing.  No wheezes, rales, rhonchi Heart- Regular rate and rhythm, no murmurs, rubs or gallops, PMI not laterally displaced GI- soft, non-tender, non-distended, bowel sounds present, no hepatosplenomegaly Extremities- no clubbing, cyanosis, or edema; DP/PT/radial pulses 2+ bilaterally MS- no significant deformity or atrophy Skin- warm and dry, no rash or lesion Psych- euthymic mood, full affect Neuro- strength and sensation are intact  EKG is ordered. 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   Assessment and Plan:  1. Frequent ectopy EF 45% SVT, NSVT, and PVCs noted Now on BB. He has not noticed significant change in his symptoms, which is primarily decreased exercise tolerance.  He has not had near syncope since episode about a month  ago. No frank syncope. Continue BB and titrate as tolerated. He has 1st degree AV block and bradycardia at baseline, will not uptitrate today.  ? If eventually may need to consider amiodarone. Worry about baseline bradycardia now on BB. May also not be ideal if underlying process ends up being more pulmonary driven.    2. Chronic systolic CHF Echo 10/4968 LVEF 45%, Mildly reduced RV, Grade 2 DD, Mild/mod LAE, Mod RAE, mild MR Cardiac output borderline low on RHC 5/26 with soft pressures.  ? If would benefit from cMRI given though ? If uptake would  be confounded by h/o CABG and AVR.  Volume overloaded on exam and he had recently cut back his lasix. He only increased back to 40 mg daily (from 20 mg daily) for the past 2 days. I encouraged him to continue to use sliding scale diuretics. I recommend he take 80 mg tomorrow to see if this helps with his symptoms.  Labs today.  Will refer to HF clinic as previously mentioned in cath.  He has low BP at baseline inhibiting GDMT, complicated disease, and some component of a gradual decline. May ultimately need to consider advanced therapies.    3. S/p AVR Stable by echo 01/2021   4. Decreased exercise tolerance He had PFTs that showed restriction on spirometry and lung volumes, No clear evidence for obstruction. Dr. Lamonte Sakai following and suspects most likely related to cardiac dysfunction and also some deconditioning.   5. CAD L/RHC 03/18/21 with: Prox LAD lesion is 50% stenosed. Mid LAD lesion is 90% stenosed. Patent LIMA to LAD. Circumflex and RCA appear patent with mild disease. Ao sat 99%, PA sat 56% (repeat value was 60%); PA pressure 15 mm Hg; mean PCWP 5 mm Hg; CO 3.4 L/min; CI 1.6  Follow HRs closely. RTC to see EP in 6 months. Sooner with EP issues.   Shirley Friar, PA-C  07/06/21 12:07 PM

## 2021-07-06 ENCOUNTER — Other Ambulatory Visit: Payer: Self-pay

## 2021-07-06 ENCOUNTER — Ambulatory Visit (INDEPENDENT_AMBULATORY_CARE_PROVIDER_SITE_OTHER): Payer: Medicare Other | Admitting: Student

## 2021-07-06 ENCOUNTER — Encounter: Payer: Self-pay | Admitting: Student

## 2021-07-06 VITALS — BP 98/72 | HR 55 | Ht 73.0 in | Wt 201.4 lb

## 2021-07-06 DIAGNOSIS — I480 Paroxysmal atrial fibrillation: Secondary | ICD-10-CM | POA: Diagnosis not present

## 2021-07-06 DIAGNOSIS — R0602 Shortness of breath: Secondary | ICD-10-CM | POA: Diagnosis not present

## 2021-07-06 DIAGNOSIS — I25119 Atherosclerotic heart disease of native coronary artery with unspecified angina pectoris: Secondary | ICD-10-CM | POA: Diagnosis not present

## 2021-07-06 DIAGNOSIS — Z952 Presence of prosthetic heart valve: Secondary | ICD-10-CM

## 2021-07-06 NOTE — Patient Instructions (Signed)
Medication Instructions:  Your physician recommends that you continue on your current medications as directed. Please refer to the Current Medication list given to you today.  *If you need a refill on your cardiac medications before your next appointment, please call your pharmacy*   Lab Work: TODAY: BMET, BNP  If you have labs (blood work) drawn today and your tests are completely normal, you will receive your results only by: [place] (if you have MyChart) OR A paper copy in the mail If you have any lab test that is abnormal or we need to change your treatment, we will call you to review the results.   Follow-Up: At [facility], you and your health needs are our priority.  As part of our continuing mission to provide you with exceptional heart care, we have created designated Provider Care Teams.  These Care Teams include your primary Cardiologist (physician) and Advanced Practice Providers (APPs -  Physician Assistants and Nurse Practitioners) who all work together to provide you with the care you need, when you need it.  Your next appointment:   6 month(s)  The format for your next appointment:   In Person  Provider:   Legrand Como "Oda Kilts, PA-C

## 2021-07-07 ENCOUNTER — Other Ambulatory Visit: Payer: Self-pay

## 2021-07-07 DIAGNOSIS — I5032 Chronic diastolic (congestive) heart failure: Secondary | ICD-10-CM

## 2021-07-07 LAB — PRO B NATRIURETIC PEPTIDE: NT-Pro BNP: 2052 pg/mL — ABNORMAL HIGH (ref 0–376)

## 2021-07-07 LAB — BASIC METABOLIC PANEL
BUN/Creatinine Ratio: 20 (ref 10–24)
BUN: 31 mg/dL — ABNORMAL HIGH (ref 8–27)
CO2: 23 mmol/L (ref 20–29)
Calcium: 9.9 mg/dL (ref 8.6–10.2)
Chloride: 102 mmol/L (ref 96–106)
Creatinine, Ser: 1.57 mg/dL — ABNORMAL HIGH (ref 0.76–1.27)
Glucose: 95 mg/dL (ref 65–99)
Potassium: 4.3 mmol/L (ref 3.5–5.2)
Sodium: 139 mmol/L (ref 134–144)
eGFR: 48 mL/min/{1.73_m2} — ABNORMAL LOW (ref >59)

## 2021-07-14 ENCOUNTER — Other Ambulatory Visit: Payer: Self-pay

## 2021-07-14 ENCOUNTER — Other Ambulatory Visit: Payer: Medicare Other | Admitting: *Deleted

## 2021-07-14 DIAGNOSIS — I5032 Chronic diastolic (congestive) heart failure: Secondary | ICD-10-CM

## 2021-07-14 LAB — BASIC METABOLIC PANEL
BUN/Creatinine Ratio: 19 (ref 10–24)
BUN: 31 mg/dL — ABNORMAL HIGH (ref 8–27)
CO2: 22 mmol/L (ref 20–29)
Calcium: 10.2 mg/dL (ref 8.6–10.2)
Chloride: 95 mmol/L — ABNORMAL LOW (ref 96–106)
Creatinine, Ser: 1.64 mg/dL — ABNORMAL HIGH (ref 0.76–1.27)
Glucose: 104 mg/dL — ABNORMAL HIGH (ref 65–99)
Potassium: 4.7 mmol/L (ref 3.5–5.2)
Sodium: 135 mmol/L (ref 134–144)
eGFR: 46 mL/min/{1.73_m2} — ABNORMAL LOW (ref >59)

## 2021-08-12 ENCOUNTER — Telehealth: Payer: Self-pay | Admitting: Interventional Cardiology

## 2021-08-12 DIAGNOSIS — I5032 Chronic diastolic (congestive) heart failure: Secondary | ICD-10-CM

## 2021-08-12 NOTE — Telephone Encounter (Signed)
Patient called and only wanted to speak to [facility]

## 2021-08-12 NOTE — Telephone Encounter (Signed)
I spoke with patient. He reports feeling flushed and short of breath recently. Was taking lasix 40 mg and potassium 20 meq daily so he increased lasix and potassium to twice daily for 2 days.  He will take twice daily today also. Shortness of breath is getting better.  He is asking about need for lab work after increasing doses.  He will come in on Monday October 24 for BMP.  Patient has referral for CHF clinic.  I told patient I would follow up on this.

## 2021-08-16 ENCOUNTER — Other Ambulatory Visit: Payer: Medicare Other | Admitting: *Deleted

## 2021-08-16 ENCOUNTER — Other Ambulatory Visit: Payer: Self-pay

## 2021-08-16 DIAGNOSIS — I5032 Chronic diastolic (congestive) heart failure: Secondary | ICD-10-CM

## 2021-08-17 ENCOUNTER — Telehealth: Payer: Self-pay | Admitting: Interventional Cardiology

## 2021-08-17 ENCOUNTER — Telehealth (HOSPITAL_COMMUNITY): Payer: Self-pay | Admitting: Internal Medicine

## 2021-08-17 LAB — BASIC METABOLIC PANEL
BUN/Creatinine Ratio: 18 (ref 10–24)
BUN: 31 mg/dL — ABNORMAL HIGH (ref 8–27)
CO2: 23 mmol/L (ref 20–29)
Calcium: 10.1 mg/dL (ref 8.6–10.2)
Chloride: 99 mmol/L (ref 96–106)
Creatinine, Ser: 1.68 mg/dL — ABNORMAL HIGH (ref 0.76–1.27)
Glucose: 117 mg/dL — ABNORMAL HIGH (ref 70–99)
Potassium: 4.7 mmol/L (ref 3.5–5.2)
Sodium: 139 mmol/L (ref 134–144)
eGFR: 44 mL/min/{1.73_m2} — ABNORMAL LOW (ref >59)

## 2021-08-17 NOTE — Telephone Encounter (Signed)
Appt sch for 10/31

## 2021-08-17 NOTE — Telephone Encounter (Signed)
Pt called to f/u w/referral, pt can be reached [phone]. Please advise

## 2021-08-17 NOTE — Telephone Encounter (Signed)
    Pt is returning call to get lab results 

## 2021-08-17 NOTE — Telephone Encounter (Signed)
Jettie Booze, MD  [name], RN Cc: P Cv Div Ch St Triage Mildly abnormal kidney function but in his usual range. OK to occasionally use extra Lasix and potassium like he did. Await heart failure eval.    Called patient with lab results as written above. Patient verbalized understanding.

## 2021-08-22 NOTE — Progress Notes (Signed)
ADVANCED HF CLINIC CONSULT NOTE  Referring Physician: Larae Grooms, MD Primary Care: [name], MD Primary Cardiologist:Jayadeep Irish Lack, MD   HPI:  Frank Berg is a 65 y/o male referred by Dr. Irish Lack for further evaluation of his HF.   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 Also has h/o AF s/p AF ablation at [facility] in 2017. CAD s/p previous stent (2008) followed by LIMA to LAD at time of AVR.   Admitted in 4/22 with near syncope. EF 45% by echo. RV mildly HK. RVSP 69mmHG with question of mild D-shaped septum  Subsequently underwent R/L cath in 5/22: mLAD 90% (patent LIMA to LAD), LCX and RCA minimal CAD.  RA 9 RV 24/0 PA 25/8 (15) PCWP 5 Fick 3.4/1.6  Says he was very active prior to this year playing pickle ball and doing line dancing and t'ai chi. However over last few months feels more SOB with less exercise tolerance. Now SOB with small inclines or even just walking. No edema, orthopnea or PND. Feels occasionally lightheaded and has episodes of presyncope. No real palpitations. No edema, orthopnea or PND. Has lost 30 pounds due to stress over ongoing divorce. Taking lasix 40 mg daily with occasional extra doses.    Review of Systems: [y] = yes, [ ]  = no   General: Weight gain [ ] ; Weight loss [ ] ; Anorexia [ ] ; Fatigue Blue.Reese ]; Fever [ ] ; Chills [ ] ; Weakness [ y]  Cardiac: Chest pain/pressure [ ] ; Resting SOB [ ] ; Exertional SOB Blue.Reese ]; Orthopnea [ ] ; Pedal Edema [ ] ; Palpitations [ ] ; Syncope [ ] ; Presyncope [ ] ; Paroxysmal nocturnal dyspnea[ ]   Pulmonary: Cough [ ] ; Wheezing[ ] ; Hemoptysis[ ] ; Sputum [ ] ; Snoring [ ]   GI: Vomiting[ ] ; Dysphagia[ ] ; Melena[ ] ; Hematochezia [ ] ; Heartburn[ ] ; Abdominal pain [ ] ; Constipation [ ] ; Diarrhea [ ] ; BRBPR [ ]   GU: Hematuria[ ] ; Dysuria [ ] ; Nocturia[ ]   Vascular: Pain in legs with walking [ ] ; Pain in feet with lying flat [ ] ; Non-healing sores [ ] ; Stroke [ ] ; TIA [ ] ; Slurred speech  [ ] ;  Neuro: Headaches[ ] ; Vertigo[ ] ; Seizures[ ] ; Paresthesias[ ] ;Blurred vision [ ] ; Diplopia [ ] ; Vision changes [ ]   Ortho/Skin: Arthritis [ y]; Joint pain [ y]; Muscle pain [ ] ; Joint swelling [ ] ; Back Pain [ ] ; Rash [ ]   Psych: Depression[ ] ; Anxiety[ ]   Heme: Bleeding problems [ ] ; Clotting disorders [ ] ; Anemia [ ]   Endocrine: Diabetes [ y]; Thyroid dysfunction[ ]    Past Medical History:  Diagnosis Date   Atrial fibrillation ([place])    Coronary artery disease    Diabetes mellitus without complication (HCC)    Diabetic polyneuropathy (HCC)    GERD (gastroesophageal reflux disease)    Gout, unspecified    HLD (hyperlipidemia)    Hx of adenomatous polyp of colon 12/03/2020   Hypertension    OSA on CPAP    Sleep apnea     Current Outpatient Medications  Medication Sig Dispense Refill   allopurinol (ZYLOPRIM) 300 MG tablet Take 300 mg by mouth daily.     bisoprolol (ZEBETA) 5 MG tablet Take 0.5 tablets (2.5 mg total) by mouth daily. 15 tablet 11   clotrimazole-betamethasone (LOTRISONE) lotion Apply topically as needed.     colchicine 0.6 MG tablet Take 0.6 mg by mouth as needed.     empagliflozin (JARDIANCE) 10 MG TABS  tablet Take 1 tablet (10 mg total) by mouth daily before breakfast. 90 tablet 1   furosemide (LASIX) 40 MG tablet Take 1 tablet (40 mg total) by mouth daily. 90 tablet 2   glucose blood (ONETOUCH VERIO) test strip TEST ONCE A DAY DX E11.29     magnesium oxide (MAG-OX) 400 MG tablet Take 400 mg by mouth daily.     metFORMIN (GLUCOPHAGE) 500 MG tablet Take 1 tablet (500 mg total) by mouth 2 (two) times daily with a meal.     Potassium Chloride ER 20 MEQ TBCR Take 20 mEq by mouth daily.     rosuvastatin (CRESTOR) 5 MG tablet Take 5 mg by mouth every evening.     tadalafil, PAH, (ADCIRCA) 20 MG tablet Take 20 mg by mouth daily as needed.     triamcinolone (KENALOG) 0.025 % ointment Apply 1 application topically as needed.     XARELTO 20 MG TABS tablet Take 1 tablet  (20 mg total) by mouth every evening. 90 tablet 3   No current facility-administered medications for this encounter.    Allergies  Allergen Reactions   Lipitor [Atorvastatin] Other (See Comments)    Body aches   Penicillins Swelling    Swollen face      Social History   Socioeconomic History   Marital status: Married    Spouse name: Not on file   Number of children: 2   Years of education: Not on file   Highest education level: Not on file  Occupational History   Occupation: retired  Tobacco Use   Smoking status: Former    Packs/day: 0.25    Years: 10.00    Pack years: 2.50    Types: Cigarettes    Quit date: 11/03/1981    Years since quitting: 39.8   Smokeless tobacco: Never  Vaping Use   Vaping Use: Never used  Substance and Sexual Activity   Alcohol use: Yes    Comment: occasional   Drug use: No   Sexual activity: Not on file  Other Topics Concern   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   Married 1 son 1 daughter   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   Other jobs including working at the [employer], providing health care for disabled, 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, 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   Social Determinants of Radio broadcast assistant Strain: Not on file  Food Insecurity: Not on file  Transportation Needs: Not on file  Physical Activity: Not on file  Stress: Not on file  Social Connections: Not on file  Intimate Partner Violence: Not on file      Family History  Problem Relation Age of Onset   Congenital heart disease Mother    Diabetes Mother    Hypertension Mother    Heart disease Mother    Heart attack Father    Cervical cancer Sister    Diabetes Sister    Hypertension Brother    Diabetes Brother    Pancreatic cancer  Brother    Diabetes Brother    Breast cancer Sister    Diabetes Sister    Diabetes Sister    Diabetes Sister    Diabetes Sister    Diabetes Son  Colon cancer Neg Hx    Colon polyps Neg Hx    Esophageal cancer Neg Hx    Stomach cancer Neg Hx     Vitals:   08/23/21 0937  BP: 110/78  Pulse: 60  SpO2: 98%  Weight: 89.6 kg (197 lb 9.6 oz)    PHYSICAL EXAM: General:  Well appearing. No respiratory difficulty HEENT: normal Neck: supple. no JVD. Carotids 2+ bilat; no bruits. No lymphadenopathy or thryomegaly appreciated. Cor: PMI nondisplaced. Regular rate & rhythm. 2/6 SEM RUSB s2 crisp Lungs: clear Abdomen: soft, nontender, nondistended. No hepatosplenomegaly. No bruits or masses. Good bowel sounds. Extremities: no cyanosis, clubbing, rash, edema Neuro: alert & oriented x 3, cranial nerves grossly intact. moves all 4 extremities w/o difficulty. Affect pleasant.  ECG: Sinus brady 58 with 1AVB. RBBB Personally reviewed    ASSESSMENT & PLAN:   1. Chronic systolic HF  - potentially mixed etiology. Ischemic and NICM. EF only mildly depressed - echo 4/22 EF 45% +LVH RV mildly HK. RVSP 50mmHG with question of mild D-shaped septum - R/L cath 5/22: mLAD 90% (patent LIMA to LAD), LCX and RCA minimal CAD.  RA 9 RV 24/0 PA 25/8 (15) PCWP 5 Fick 3.4/1.6 - NYHA III - which seems a bit out of proportion to echo findings. However output low on RHC - volume status ok/low on lasix 40 daily - continue bisoprolol 2.5 - continue Jardiance 10 - stop kdur. Add spiro 12.5 - hopefully can get ARNI/ARB on soon - Will get CPX testing and cardiac MRI to further evaluate - I also question whether the stress of his divorce may be impacting his functional capacity - Labs in 2 weeks  2. CAD - s/p previous stent 2008 (unknown vessel) & LIMA to LAD 2011 - no s/s angina - continue statin. No ASA with Xarelto  3. Bicuspid AoV s/p bioprosthetic AVR 2011 - stable on last echo - aware of SBE  prophylaxis  4. PAF - s/p AF ablation 2017 - no recurrence.  - followed by Dr. Rayann Heman - Continue Xarelto  5. OSA - compliant with CPAP  6. 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

## 2021-08-23 ENCOUNTER — Other Ambulatory Visit: Payer: Self-pay

## 2021-08-23 ENCOUNTER — Encounter (HOSPITAL_COMMUNITY): Payer: Self-pay | Admitting: Internal Medicine

## 2021-08-23 ENCOUNTER — Ambulatory Visit (HOSPITAL_COMMUNITY)
Admission: RE | Admit: 2021-08-23 | Discharge: 2021-08-23 | Disposition: A | Discharge status: Home / Self Care | Payer: Medicare Other | Source: Ambulatory Visit | Attending: Internal Medicine | Admitting: Internal Medicine

## 2021-08-23 VITALS — BP 110/78 | HR 60 | Wt 197.6 lb

## 2021-08-23 DIAGNOSIS — E1142 Type 2 diabetes mellitus with diabetic polyneuropathy: Secondary | ICD-10-CM | POA: Insufficient documentation

## 2021-08-23 DIAGNOSIS — Z953 Presence of xenogenic heart valve: Secondary | ICD-10-CM | POA: Diagnosis not present

## 2021-08-23 DIAGNOSIS — Z7984 Long term (current) use of oral hypoglycemic drugs: Secondary | ICD-10-CM | POA: Insufficient documentation

## 2021-08-23 DIAGNOSIS — I2510 Atherosclerotic heart disease of native coronary artery without angina pectoris: Secondary | ICD-10-CM | POA: Diagnosis not present

## 2021-08-23 DIAGNOSIS — N1831 Chronic kidney disease, stage 3a: Secondary | ICD-10-CM | POA: Diagnosis not present

## 2021-08-23 DIAGNOSIS — I130 Hypertensive heart and chronic kidney disease with heart failure and stage 1 through stage 4 chronic kidney disease, or unspecified chronic kidney disease: Secondary | ICD-10-CM | POA: Diagnosis present

## 2021-08-23 DIAGNOSIS — I480 Paroxysmal atrial fibrillation: Secondary | ICD-10-CM | POA: Insufficient documentation

## 2021-08-23 DIAGNOSIS — Z7901 Long term (current) use of anticoagulants: Secondary | ICD-10-CM | POA: Insufficient documentation

## 2021-08-23 DIAGNOSIS — G4733 Obstructive sleep apnea (adult) (pediatric): Secondary | ICD-10-CM | POA: Diagnosis not present

## 2021-08-23 DIAGNOSIS — I5022 Chronic systolic (congestive) heart failure: Secondary | ICD-10-CM

## 2021-08-23 DIAGNOSIS — Z951 Presence of aortocoronary bypass graft: Secondary | ICD-10-CM | POA: Diagnosis not present

## 2021-08-23 DIAGNOSIS — I25119 Atherosclerotic heart disease of native coronary artery with unspecified angina pectoris: Secondary | ICD-10-CM

## 2021-08-23 DIAGNOSIS — I428 Other cardiomyopathies: Secondary | ICD-10-CM | POA: Insufficient documentation

## 2021-08-23 DIAGNOSIS — Z880 Allergy status to penicillin: Secondary | ICD-10-CM | POA: Insufficient documentation

## 2021-08-23 DIAGNOSIS — Z833 Family history of diabetes mellitus: Secondary | ICD-10-CM | POA: Diagnosis not present

## 2021-08-23 DIAGNOSIS — Z952 Presence of prosthetic heart valve: Secondary | ICD-10-CM | POA: Diagnosis not present

## 2021-08-23 DIAGNOSIS — E1122 Type 2 diabetes mellitus with diabetic chronic kidney disease: Secondary | ICD-10-CM | POA: Insufficient documentation

## 2021-08-23 DIAGNOSIS — Z8249 Family history of ischemic heart disease and other diseases of the circulatory system: Secondary | ICD-10-CM | POA: Diagnosis not present

## 2021-08-23 MED ORDER — SPIRONOLACTONE 25 MG PO TABS: 12.5000 mg | ORAL_TABLET | Freq: Every day | ORAL | 3 refills | Status: DC

## 2021-08-23 NOTE — Addendum Note (Signed)
Encounter addended by: Shonna Chock, CMA on: 35/32/9924 10:30 AM  Actions taken: Visit diagnoses modified, Order list changed, Diagnosis association updated, Clinical Note Signed

## 2021-08-23 NOTE — Patient Instructions (Signed)
EKG done today.  RedsClip done today.   No Labs done today.   STOP taking Potassium'  START Spironolactone 12.5mg  (1/2 tablet) by mouth daily.   No other medication changes were made. Please continue all current medications as prescribed.  Your physician has recommended that you have a cardiopulmonary stress test (CPX). CPX testing is a non-invasive measurement of heart and lung function. It replaces a traditional treadmill stress test. This type of test provides a tremendous amount of information that relates not only to your present condition but also for future outcomes. This test combines measurements of you ventilation, respiratory gas exchange in the lungs, electrocardiogram (EKG), blood pressure and physical response before, during, and following an exercise protocol.  Your physician has requested that you have a cardiac MRI. Cardiac MRI uses a computer to create images of your heart as its beating, producing both still and moving pictures of your heart and major blood vessels. This has to be approved through your insurance prior to scheduling, once approved we will contact you to schedule an appointment.   Your physician recommends that you schedule a follow-up appointment in: 2 weeks for a lab only appointment and in 3-4 months with Dr. Haroldine Laws  If you have any questions or concerns before your next appointment please send [place] a message through [place] or call our office at [phone].    TO LEAVE A MESSAGE FOR THE NURSE SELECT OPTION 2, PLEASE LEAVE A MESSAGE INCLUDING: YOUR NAME DATE OF BIRTH CALL BACK NUMBER REASON FOR CALL**this is important as we prioritize the call backs  YOU WILL RECEIVE A CALL BACK THE SAME DAY AS LONG AS YOU CALL BEFORE 4:00 PM   Do the following things EVERYDAY: Weigh yourself in the morning before breakfast. Write it down and keep it in a log. Take your medicines as prescribed Eat low salt foods--Limit salt (sodium) to 2000 mg per day.  Stay as  active as you can everyday Limit all fluids for the day to less than 2 liters   At the [facility], you and your health needs are our priority. As part of our continuing mission to provide you with exceptional heart care, we have created designated Provider Care Teams. These Care Teams include your primary Cardiologist (physician) and Advanced Practice Providers (APPs- Physician Assistants and Nurse Practitioners) who all work together to provide you with the care you need, when you need it.   You may see any of the following providers on your designated Care Team at your next follow up: Dr Glori Bickers Dr Haynes Kerns, NP Lyda Jester, [place] Audry Riles, PharmD   Please be sure to bring in all your medications bottles to every appointment.

## 2021-08-23 NOTE — Progress Notes (Signed)
ReDS Vest / Clip - 08/23/21 1000       ReDS Vest / Clip   Station Marker D    Ruler Value 32.5    ReDS Value Range Low volume    ReDS Actual Value 35

## 2021-08-23 NOTE — Addendum Note (Signed)
Encounter addended by: Stanford Scotland, RN on: 08/23/2021 10:32 AM  Actions taken: Flowsheet accepted, Clinical Note Signed

## 2021-08-25 ENCOUNTER — Ambulatory Visit: Payer: Medicare Other | Admitting: Podiatry

## 2021-08-31 ENCOUNTER — Other Ambulatory Visit: Payer: Self-pay

## 2021-08-31 ENCOUNTER — Ambulatory Visit: Payer: Medicare Other | Admitting: Podiatry

## 2021-08-31 DIAGNOSIS — M79675 Pain in left toe(s): Secondary | ICD-10-CM | POA: Diagnosis not present

## 2021-08-31 DIAGNOSIS — L84 Corns and callosities: Secondary | ICD-10-CM | POA: Diagnosis not present

## 2021-08-31 DIAGNOSIS — B351 Tinea unguium: Secondary | ICD-10-CM | POA: Diagnosis not present

## 2021-08-31 DIAGNOSIS — M79674 Pain in right toe(s): Secondary | ICD-10-CM

## 2021-08-31 DIAGNOSIS — E1142 Type 2 diabetes mellitus with diabetic polyneuropathy: Secondary | ICD-10-CM | POA: Diagnosis not present

## 2021-09-04 ENCOUNTER — Encounter: Payer: Self-pay | Admitting: Podiatry

## 2021-09-04 NOTE — Progress Notes (Signed)
  Subjective:  Patient ID: Frank Berg, male    DOB: February 17, 1956,  MRN: [id number]  65 y.o. male presents with at risk foot care with history of diabetic neuropathy and painful elongated mycotic toenails 1-5 bilaterally which are tender when wearing enclosed shoe gear. Pain is relieved with periodic professional debridement..    Patient's last A1c was 6.9%. He voices no new pedal concerns on today's visit.  PCP: [name], MD .  Review of Systems: Negative except as noted in the HPI.   Allergies  Allergen Reactions   Lipitor [Atorvastatin] Other (See Comments)    Body aches   Penicillins Swelling    Swollen face    Objective:  There were no vitals filed for this visit. Constitutional Patient is a pleasant 65 y.o. African American male in NAD. AAO x 3.  Vascular Capillary fill time to digits immediate b/l.  DP/PT pulse(s) are palpable b/l lower extremities. Pedal hair absent. Lower extremity skin temperature gradient within normal limits. No pain with calf compression b/l. No edema noted b/l lower extremities. No cyanosis or clubbing noted.   Neurologic Protective sensation intact 5/5 intact bilaterally with 10g monofilament b/l. Vibratory sensation intact b/l. No clonus b/l. Pt has subjective symptoms of neuropathy.  Dermatologic Pedal skin is warm and supple b/l LE. No open wounds b/l LE. No interdigital macerations noted b/l LE. Toenails 1-5 b/l elongated, discolored, dystrophic, thickened, crumbly with subungual debris and tenderness to dorsal palpation. Toenails 1-5 left, R hallux, R 2nd toe, R 4th toe, and R 5th toe elongated, discolored, dystrophic, thickened, and crumbly with subungual debris and tenderness to dorsal palpation. Anonychia noted R 3rd toe. Nailbed(s) epithelialized.  Hyperkeratotic lesion(s) submet head 3 left foot.  No erythema, no edema, no drainage, no fluctuance.  Orthopedic: Normal muscle strength 5/5 to all lower extremity muscle groups bilaterally.  Patient ambulates independent of any assistive aids. Hammertoe deformity noted 2-5 b/l.   Hemoglobin A1C Latest Ref Rng & Units 02/12/2021  HGBA1C 4.8 - 5.6 % 6.9(H)  Some recent data might be hidden   Assessment:   1. Pain due to onychomycosis of toenails of both feet   2. Callus   3. Diabetic peripheral neuropathy associated with type 2 diabetes mellitus ([place])    Plan:  Patient was evaluated and treated and all questions answered. Consent given for treatment as described below: -Continue diabetic foot care principles: inspect feet daily, monitor glucose as recommended by PCP and/or Endocrinologist, and follow prescribed diet per PCP, Endocrinologist and/or dietician. -Toenails 1-5 left, R hallux, R 2nd toe, R 4th toe, and R 5th toe debrided in length and girth without iatrogenic bleeding with sterile nail nipper and dremel.  -Callus(es) submet head 3 left foot pared utilizing sterile scalpel blade without complication or incident. Total number debrided =1. -Patient/POA to call should there be question/concern in the interim.  Return in about 3 months (around 12/01/2021).  Marzetta Board, DPM

## 2021-09-07 ENCOUNTER — Other Ambulatory Visit: Payer: Self-pay

## 2021-09-07 ENCOUNTER — Other Ambulatory Visit (HOSPITAL_COMMUNITY): Payer: Self-pay | Admitting: *Deleted

## 2021-09-07 ENCOUNTER — Other Ambulatory Visit (HOSPITAL_COMMUNITY): Payer: Medicare Other

## 2021-09-07 ENCOUNTER — Ambulatory Visit (HOSPITAL_COMMUNITY): Payer: Medicare Other

## 2021-09-07 ENCOUNTER — Ambulatory Visit (HOSPITAL_COMMUNITY)
Admission: RE | Admit: 2021-09-07 | Discharge: 2021-09-07 | Disposition: A | Discharge status: Home / Self Care | Payer: Medicare Other | Source: Ambulatory Visit | Attending: Internal Medicine | Admitting: Internal Medicine

## 2021-09-07 DIAGNOSIS — I5022 Chronic systolic (congestive) heart failure: Secondary | ICD-10-CM

## 2021-09-07 LAB — CBC
HCT: 49.9 % (ref 39.0–52.0)
Hemoglobin: 16.3 g/dL (ref 13.0–17.0)
MCH: 29.2 pg (ref 26.0–34.0)
MCHC: 32.7 g/dL (ref 30.0–36.0)
MCV: 89.3 fL (ref 80.0–100.0)
Platelets: 259 10*3/uL (ref 150–400)
RBC: 5.59 MIL/uL (ref 4.22–5.81)
RDW: 15.4 % (ref 11.5–15.5)
WBC: 5.9 10*3/uL (ref 4.0–10.5)
nRBC: 0 % (ref 0.0–0.2)

## 2021-09-07 LAB — BRAIN NATRIURETIC PEPTIDE: B Natriuretic Peptide: 419.7 pg/mL — ABNORMAL HIGH (ref 0.0–100.0)

## 2021-09-07 LAB — BASIC METABOLIC PANEL
Anion gap: 10 (ref 5–15)
BUN: 36 mg/dL — ABNORMAL HIGH (ref 8–23)
CO2: 26 mmol/L (ref 22–32)
Calcium: 10 mg/dL (ref 8.9–10.3)
Chloride: 101 mmol/L (ref 98–111)
Creatinine, Ser: 1.58 mg/dL — ABNORMAL HIGH (ref 0.61–1.24)
GFR, Estimated: 48 mL/min — ABNORMAL LOW (ref >60)
Glucose, Bld: 105 mg/dL — ABNORMAL HIGH (ref 70–99)
Potassium: 4.3 mmol/L (ref 3.5–5.1)
Sodium: 137 mmol/L (ref 135–145)

## 2021-09-07 LAB — TSH: TSH: 5.417 u[IU]/mL — ABNORMAL HIGH (ref 0.350–4.500)

## 2021-09-09 ENCOUNTER — Encounter: Payer: Self-pay | Admitting: Adult Health

## 2021-09-09 ENCOUNTER — Ambulatory Visit: Payer: Medicare Other | Admitting: Adult Health

## 2021-09-09 VITALS — BP 118/70 | HR 70

## 2021-09-09 DIAGNOSIS — G4733 Obstructive sleep apnea (adult) (pediatric): Secondary | ICD-10-CM | POA: Diagnosis not present

## 2021-09-09 DIAGNOSIS — Z9989 Dependence on other enabling machines and devices: Secondary | ICD-10-CM

## 2021-09-09 NOTE — Progress Notes (Signed)
PATIENT: Frank Berg DOB: 06/09/1956  REASON FOR VISIT: follow up HISTORY FROM: patient  HISTORY OF PRESENT ILLNESS: Today 09/09/21: Frank Berg is a 65 year old male with a history of obstructive sleep apnea on CPAP.  He returns today for follow-up.  His download is attached to this note.  He reports that he has been having shortness of breath.  He is seeing cardiologist for heart failure.  He reports that they are adjusting his medications.  He states that night he feels that he is not getting enough pressure when he puts the mask on.  He returns today for an evaluation.   05/04/20: Frank Berg is a 65 year old male with a history of obstructive sleep apnea on CPAP.  His download indicates that he uses machine nightly for compliance of 100%.  He uses machine greater than 4 hours each night.  On average he uses his machine 9 hours and 18 minutes.  His residual AHI is 8 on 8 to 16 cm of water with EPR of 2.  Leak in the 95th percentile is 29.6 L/min.  Pressure in the 95th percentile is 12.1 L/min. she reports that he does change out his supplies regularly.  Also reports he has been having some shortness of breath.  Reports his cardiologist increase Lasix and potassium.  Reports that despite having shortness of breath he has been able to go to the gym to exercise.  He returns today for an evaluation.  HISTORY 04/10/19:   Frank Berg is a 65 year old male with a history of obstructive sleep apnea on CPAP.  His CPAP download indicates that he uses his machine every night for compliance of 100%.  He uses machine greater than 4 hours each night.  On average he uses his machine 8 hours and 51 minutes.  His residual AHI is 4.2 on 8 to 16 cm of water with EPR of 3.  His leak in the 95th percentile is 23.  He is currently wearing the nasal mask.  States that occasionally he feels the mask leaking but typically is able to readjust.  Overall he feels that he is doing well.  He returns today for  follow-up.   REVIEW OF SYSTEMS: Out of a complete 14 system review of symptoms, the patient complains only of the following symptoms, and all other reviewed systems are negative.  FSS 22 ESS 4  ALLERGIES: Allergies  Allergen Reactions   Lipitor [Atorvastatin] Other (See Comments)    Body aches   Penicillins Swelling    Swollen face    HOME MEDICATIONS: Outpatient Medications Prior to Visit  Medication Sig Dispense Refill   allopurinol (ZYLOPRIM) 300 MG tablet Take 300 mg by mouth daily.     bisoprolol (ZEBETA) 5 MG tablet Take 0.5 tablets (2.5 mg total) by mouth daily. 15 tablet 11   clotrimazole-betamethasone (LOTRISONE) lotion Apply topically as needed.     colchicine 0.6 MG tablet Take 0.6 mg by mouth as needed.     empagliflozin (JARDIANCE) 10 MG TABS tablet Take 1 tablet (10 mg total) by mouth daily before breakfast. 90 tablet 1   furosemide (LASIX) 40 MG tablet Take 1 tablet (40 mg total) by mouth daily. 90 tablet 2   glucose blood (ONETOUCH VERIO) test strip TEST ONCE A DAY DX E11.29     magnesium oxide (MAG-OX) 400 MG tablet Take 400 mg by mouth daily.     metFORMIN (GLUCOPHAGE) 500 MG tablet Take 1 tablet (500 mg total) by mouth 2 (two)  times daily with a meal.     rosuvastatin (CRESTOR) 5 MG tablet Take 5 mg by mouth every evening.     spironolactone (ALDACTONE) 25 MG tablet Take 0.5 tablets (12.5 mg total) by mouth daily. 45 tablet 3   tadalafil, PAH, (ADCIRCA) 20 MG tablet Take 20 mg by mouth daily as needed.     triamcinolone (KENALOG) 0.025 % ointment Apply 1 application topically as needed.     XARELTO 20 MG TABS tablet Take 1 tablet (20 mg total) by mouth every evening. 90 tablet 3   No facility-administered medications prior to visit.    PAST MEDICAL HISTORY: Past Medical History:  Diagnosis Date   Atrial fibrillation (HCC)    Coronary artery disease    Diabetes mellitus without complication (HCC)    Diabetic polyneuropathy (HCC)    GERD (gastroesophageal  reflux disease)    Gout, unspecified    HLD (hyperlipidemia)    Hx of adenomatous polyp of colon 12/03/2020   Hypertension    OSA on CPAP    Sleep apnea     PAST SURGICAL HISTORY: Past Surgical History:  Procedure Laterality Date   ABLATION     AORTIC VALVE REPLACEMENT  2011   CERVICAL DISC SURGERY     spinal stenosis   COLONOSCOPY     ESOPHAGOGASTRODUODENOSCOPY     RIGHT/LEFT HEART CATH AND CORONARY/GRAFT ANGIOGRAPHY N/A 03/18/2021   Procedure: RIGHT/LEFT HEART CATH AND CORONARY/GRAFT ANGIOGRAPHY;  Surgeon: Varanasi, Jayadeep S, MD;  Location: [facility];  Service: Cardiovascular;  Laterality: N/A;   TONSILLECTOMY      FAMILY HISTORY: Family History  Problem Relation Age of Onset   Congenital heart disease Mother    Diabetes Mother    Hypertension Mother    Heart disease Mother    Heart attack Father    Cervical cancer Sister    Diabetes Sister    Hypertension Brother    Diabetes Brother    Pancreatic cancer Brother    Diabetes Brother    Breast cancer Sister    Diabetes Sister    Diabetes Sister    Diabetes Sister    Diabetes Sister    Diabetes Son    Colon cancer Neg Hx    Colon polyps Neg Hx    Esophageal cancer Neg Hx    Stomach cancer Neg Hx     SOCIAL HISTORY: Social History   Socioeconomic History   Marital status: Single    Spouse name: Not on file   Number of children: 2   Years of education: Not on file   Highest education level: Not on file  Occupational History   Occupation: retired  Tobacco Use   Smoking status: Former    Packs/day: 0.25    Years: 10.00    Pack years: 2.50    Types: Cigarettes    Quit date: 11/03/1981    Years since quitting: 39.8   Smokeless tobacco: Never  Vaping Use   Vaping Use: Never used  Substance and Sexual Activity   Alcohol use: Yes    Comment: occasional   Drug use: No   Sexual activity: Not on file  Other Topics Concern   Not on file  Social History Narrative   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   Married 1 son 1 daughter   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   Other jobs including working at   the [employer], providing health care for disabled, 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  Generalized: Well developed, in no acute distress  Chest: Lungs clear to auscultation bilaterally  Neurological examination  Mentation: Alert oriented to time, place, history taking. Follows all commands speech and language fluent Cranial nerve II-XII: Extraocular movements were full, visual field were full on confrontational test Head turning and shoulder shrug  were normal and symmetric. Motor: The motor testing reveals 5 over 5 strength of all 4 extremities. Good symmetric motor tone is noted throughout.  Sensory: Sensory testing is intact to soft touch on all 4 extremities. No evidence of extinction is noted.  Gait and station: Gait is normal.    DIAGNOSTIC DATA (LABS, IMAGING, TESTING) - I reviewed patient records, labs, notes, testing and imaging myself where available.  Lab Results  Component Value Date   WBC 5.9 09/07/2021   HGB 16.3 09/07/2021   HCT 49.9 09/07/2021   MCV 89.3 09/07/2021   PLT 259 09/07/2021      Component Value Date/Time   NA 137 09/07/2021 1151   NA 139 08/16/2021 1337   K 4.3 09/07/2021 1151   CL 101 09/07/2021 1151   CO2 26 09/07/2021 1151   GLUCOSE 105 (H)  09/07/2021 1151   BUN 36 (H) 09/07/2021 1151   BUN 31 (H) 08/16/2021 1337   CREATININE 1.58 (H) 09/07/2021 1151   CALCIUM 10.0 09/07/2021 1151   PROT 6.5 04/01/2021 0942   ALBUMIN 4.1 04/01/2021 0942   AST 45 (H) 04/01/2021 0942   ALT 16 04/01/2021 0942   ALKPHOS 102 04/01/2021 0942   BILITOT 0.5 04/01/2021 0942   GFRNONAA 48 (L) 09/07/2021 1151   GFRAA 65 09/07/2020 1105      ASSESSMENT AND PLAN 65 y.o. year old male  has a past medical history of Atrial fibrillation ([place]), Coronary artery disease, Diabetes mellitus without complication ([place]), Diabetic polyneuropathy ([place]), GERD (gastroesophageal reflux disease), Gout, unspecified, HLD (hyperlipidemia), adenomatous polyp of colon (12/03/2020), Hypertension, OSA on CPAP, and Sleep apnea. here with:  OSA on CPAP  - CPAP compliance excellent -Residual AHI is elevated -Mask refitting ordered -Change pressure 10 to 20 cm of water -We will get a download in 1 month if no improvement will consider titration - Encourage patient to use CPAP nightly and > 4 hours each night - F/U in 1 year or sooner if needed   Ward Givens, MSN, NP-C 09/09/2021, 1:05 PM [facility] [address] [place], [place] [address] [phone]

## 2021-09-09 NOTE — Patient Instructions (Signed)
Continue using CPAP nightly and greater than 4 hours each night Change pressure Mask refitting If your symptoms worsen or you develop new symptoms please let us know.

## 2021-09-14 ENCOUNTER — Encounter (HOSPITAL_COMMUNITY): Payer: Self-pay | Admitting: Internal Medicine

## 2021-09-21 ENCOUNTER — Telehealth (HOSPITAL_COMMUNITY): Payer: Self-pay | Admitting: Licensed Clinical Social Worker

## 2021-09-21 ENCOUNTER — Other Ambulatory Visit: Payer: Self-pay

## 2021-09-21 ENCOUNTER — Telehealth (HOSPITAL_COMMUNITY): Payer: Medicare Other | Admitting: Emergency Medicine

## 2021-09-21 ENCOUNTER — Other Ambulatory Visit (HOSPITAL_COMMUNITY): Payer: Self-pay | Admitting: *Deleted

## 2021-09-21 ENCOUNTER — Other Ambulatory Visit: Payer: Medicare Other

## 2021-09-21 DIAGNOSIS — I5022 Chronic systolic (congestive) heart failure: Secondary | ICD-10-CM

## 2021-09-21 NOTE — Telephone Encounter (Signed)
Called and spoke with pt. Pt aware and agreeable with plan. Lab appt scheduled.

## 2021-09-21 NOTE — Telephone Encounter (Signed)
CSW received call from a Ms. Chanetta Marshall [phone]) stating she is the pts therapist and that he had wanted her to help coordinate his care with his medical providers.  CSW attempted to call pt to confirm he was agreeable to me speaking with the therapist about his care- left VM requesting return call  Will continue to follow and assist as needed  Jorge Ny, [facility] Desk#: [phone] Cell#: [phone]

## 2021-09-21 NOTE — Telephone Encounter (Signed)
Reaching out to patient to offer assistance regarding upcoming cardiac imaging study; pt verbalizes understanding of appt date/time, parking situation and where to check in, and verified current allergies; name and call back number provided for further questions should they arise Marchia Bond RN Navigator Cardiac Imaging [facility] [phone] office [phone] cell  R arm better than L Denies claustro Denies implants  Pt with question about VAD and moving forward-who would be the best person for him to reach out to regarding cardiac care (bensimhon vs varanasi). Pt states he felt like he was retaining fluid over holiday weekend and doubled up on diuretic and would like labs done to assess fluid retention.

## 2021-09-22 ENCOUNTER — Ambulatory Visit (HOSPITAL_COMMUNITY)
Admission: RE | Admit: 2021-09-22 | Discharge: 2021-09-22 | Disposition: A | Discharge status: Home / Self Care | Payer: Medicare Other | Source: Ambulatory Visit | Attending: Internal Medicine | Admitting: Internal Medicine

## 2021-09-22 ENCOUNTER — Ambulatory Visit (HOSPITAL_COMMUNITY)
Admission: RE | Admit: 2021-09-22 | Discharge: 2021-09-22 | Disposition: A | Discharge status: Home / Self Care | Payer: Medicare Other | Source: Ambulatory Visit

## 2021-09-22 DIAGNOSIS — I5022 Chronic systolic (congestive) heart failure: Secondary | ICD-10-CM | POA: Insufficient documentation

## 2021-09-22 LAB — BASIC METABOLIC PANEL
Anion gap: 9 (ref 5–15)
BUN: 38 mg/dL — ABNORMAL HIGH (ref 8–23)
CO2: 28 mmol/L (ref 22–32)
Calcium: 9.3 mg/dL (ref 8.9–10.3)
Chloride: 99 mmol/L (ref 98–111)
Creatinine, Ser: 2.16 mg/dL — ABNORMAL HIGH (ref 0.61–1.24)
GFR, Estimated: 33 mL/min — ABNORMAL LOW (ref >60)
Glucose, Bld: 100 mg/dL — ABNORMAL HIGH (ref 70–99)
Potassium: 4.6 mmol/L (ref 3.5–5.1)
Sodium: 136 mmol/L (ref 135–145)

## 2021-09-22 LAB — BRAIN NATRIURETIC PEPTIDE: B Natriuretic Peptide: 438.1 pg/mL — ABNORMAL HIGH (ref 0.0–100.0)

## 2021-09-22 MED ORDER — GADOBUTROL 1 MMOL/ML IV SOLN
12.0000 mL | Freq: Once | INTRAVENOUS | Status: AC | PRN
  Administered 2021-09-22: 12 mL via INTRAVENOUS

## 2021-09-27 ENCOUNTER — Telehealth: Payer: Self-pay | Admitting: Cardiology

## 2021-09-27 ENCOUNTER — Other Ambulatory Visit: Payer: Self-pay | Admitting: Physician Assistant

## 2021-09-27 ENCOUNTER — Encounter (HOSPITAL_COMMUNITY): Payer: Self-pay | Admitting: Emergency Medicine

## 2021-09-27 ENCOUNTER — Emergency Department (HOSPITAL_COMMUNITY)
Admission: EM | Admit: 2021-09-27 | Discharge: 2021-09-27 | Disposition: A | Discharge status: Home / Self Care | Payer: Medicare Other | Attending: Emergency Medicine | Admitting: Emergency Medicine

## 2021-09-27 ENCOUNTER — Emergency Department (HOSPITAL_COMMUNITY): Payer: Medicare Other

## 2021-09-27 ENCOUNTER — Other Ambulatory Visit: Payer: Self-pay

## 2021-09-27 DIAGNOSIS — E1122 Type 2 diabetes mellitus with diabetic chronic kidney disease: Secondary | ICD-10-CM | POA: Diagnosis not present

## 2021-09-27 DIAGNOSIS — I130 Hypertensive heart and chronic kidney disease with heart failure and stage 1 through stage 4 chronic kidney disease, or unspecified chronic kidney disease: Secondary | ICD-10-CM | POA: Diagnosis not present

## 2021-09-27 DIAGNOSIS — Z79899 Other long term (current) drug therapy: Secondary | ICD-10-CM | POA: Diagnosis not present

## 2021-09-27 DIAGNOSIS — I4891 Unspecified atrial fibrillation: Secondary | ICD-10-CM | POA: Insufficient documentation

## 2021-09-27 DIAGNOSIS — I2510 Atherosclerotic heart disease of native coronary artery without angina pectoris: Secondary | ICD-10-CM | POA: Insufficient documentation

## 2021-09-27 DIAGNOSIS — Z7901 Long term (current) use of anticoagulants: Secondary | ICD-10-CM | POA: Insufficient documentation

## 2021-09-27 DIAGNOSIS — N182 Chronic kidney disease, stage 2 (mild): Secondary | ICD-10-CM | POA: Insufficient documentation

## 2021-09-27 DIAGNOSIS — Z7984 Long term (current) use of oral hypoglycemic drugs: Secondary | ICD-10-CM | POA: Diagnosis not present

## 2021-09-27 DIAGNOSIS — R55 Syncope and collapse: Secondary | ICD-10-CM | POA: Diagnosis present

## 2021-09-27 DIAGNOSIS — R0602 Shortness of breath: Secondary | ICD-10-CM | POA: Insufficient documentation

## 2021-09-27 DIAGNOSIS — I5042 Chronic combined systolic (congestive) and diastolic (congestive) heart failure: Secondary | ICD-10-CM | POA: Insufficient documentation

## 2021-09-27 DIAGNOSIS — Z87891 Personal history of nicotine dependence: Secondary | ICD-10-CM | POA: Diagnosis not present

## 2021-09-27 DIAGNOSIS — Z20822 Contact with and (suspected) exposure to covid-19: Secondary | ICD-10-CM | POA: Insufficient documentation

## 2021-09-27 LAB — COMPREHENSIVE METABOLIC PANEL
ALT: 10 U/L (ref 0–44)
AST: 34 U/L (ref 15–41)
Albumin: 3.6 g/dL (ref 3.5–5.0)
Alkaline Phosphatase: 99 U/L (ref 38–126)
Anion gap: 8 (ref 5–15)
BUN: 39 mg/dL — ABNORMAL HIGH (ref 8–23)
CO2: 26 mmol/L (ref 22–32)
Calcium: 9.3 mg/dL (ref 8.9–10.3)
Chloride: 97 mmol/L — ABNORMAL LOW (ref 98–111)
Creatinine, Ser: 1.76 mg/dL — ABNORMAL HIGH (ref 0.61–1.24)
GFR, Estimated: 42 mL/min — ABNORMAL LOW (ref >60)
Glucose, Bld: 105 mg/dL — ABNORMAL HIGH (ref 70–99)
Potassium: 4.5 mmol/L (ref 3.5–5.1)
Sodium: 131 mmol/L — ABNORMAL LOW (ref 135–145)
Total Bilirubin: 1.2 mg/dL (ref 0.3–1.2)
Total Protein: 7.1 g/dL (ref 6.5–8.1)

## 2021-09-27 LAB — CBC WITH DIFFERENTIAL/PLATELET
Abs Immature Granulocytes: 0.01 10*3/uL (ref 0.00–0.07)
Basophils Absolute: 0.1 10*3/uL (ref 0.0–0.1)
Basophils Relative: 1 %
Eosinophils Absolute: 0 10*3/uL (ref 0.0–0.5)
Eosinophils Relative: 1 %
HCT: 43.2 % (ref 39.0–52.0)
Hemoglobin: 14.4 g/dL (ref 13.0–17.0)
Immature Granulocytes: 0 %
Lymphocytes Relative: 33 %
Lymphs Abs: 1.9 10*3/uL (ref 0.7–4.0)
MCH: 29.4 pg (ref 26.0–34.0)
MCHC: 33.3 g/dL (ref 30.0–36.0)
MCV: 88.2 fL (ref 80.0–100.0)
Monocytes Absolute: 0.5 10*3/uL (ref 0.1–1.0)
Monocytes Relative: 9 %
Neutro Abs: 3.1 10*3/uL (ref 1.7–7.7)
Neutrophils Relative %: 56 %
Platelets: 264 10*3/uL (ref 150–400)
RBC: 4.9 MIL/uL (ref 4.22–5.81)
RDW: 15.5 % (ref 11.5–15.5)
WBC: 5.6 10*3/uL (ref 4.0–10.5)
nRBC: 0 % (ref 0.0–0.2)

## 2021-09-27 LAB — TROPONIN I (HIGH SENSITIVITY)
Troponin I (High Sensitivity): 91 ng/L — ABNORMAL HIGH (ref <18)
Troponin I (High Sensitivity): 81 ng/L — ABNORMAL HIGH (ref <18)

## 2021-09-27 LAB — RESP PANEL BY RT-PCR (FLU A&B, COVID) ARPGX2
Influenza A by PCR: NEGATIVE
Influenza B by PCR: NEGATIVE
SARS Coronavirus 2 by RT PCR: NEGATIVE

## 2021-09-27 LAB — BRAIN NATRIURETIC PEPTIDE: B Natriuretic Peptide: 431.9 pg/mL — ABNORMAL HIGH (ref 0.0–100.0)

## 2021-09-27 NOTE — ED Provider Notes (Signed)
MSE was initiated and I personally evaluated the patient and placed orders (if any) at  1:25 AM on September 27, 2021.  Patient with history of CHF, DM, a-fib s/p ablation, HTN, CAD, HLD, OSA presents after syncopal episode today. He states he has been having episodes recently where he feels he is about to pass out and today, he stood from the couch and woke up on the floor. No chest pain, SOB, fever.   Today's Vitals   09/27/21 0034 09/27/21 0049  BP: 114/80   Pulse: 64   Resp: 18   Temp: (!) 97.5 F (36.4 C)   TempSrc: Oral   SpO2: 100%   PainSc:  0-No pain   There is no height or weight on file to calculate BMI.  RRR Awake, oriented Lungs clear  The patient appears stable so that the remainder of the MSE may be completed by another provider.   Charlann Lange, PA-C 09/27/21 0126    Ripley Fraise, MD 09/27/21 [phone]

## 2021-09-27 NOTE — Telephone Encounter (Signed)
Pt and his social worker called, they were both frustrated that he is having symptoms of SOB and lightheadedness and has a difficult time reaching someone in our offices.   He was seen in ER today with syncope.  He has been lightheaded recently as well.  His symptoms continue and he and his Education officer, museum are concerned.    He thought he had appt with Dr. Haroldine Laws this week - I do not see but will send this to Dr. Haroldine Laws and Nira Conn.  Please call pt and clarify,   he did hold lasix today and still is lightheaded.   I asked him if still feels bad to go back to ER otherwise Dr. Letha Cape would call tomorrow.

## 2021-09-27 NOTE — Discharge Instructions (Addendum)
You have been seen and discharged from the emergency department.  Your blood work was baseline and reassuring for you.  I spoke with on-call cardiologist Dr. Marlou Porch, he works in the group with Dr. Irish Lack.  They will follow-up with you as an outpatient this week and arrange for further cardiac testing.  Stop taking your Lasix.  If you gain more than 3 pounds in a day you may take your prescribed Lasix dose.  Follow-up with your primary provider for reevaluation and further care. Take home medications as prescribed. If you have any worsening symptoms or further concerns for your health please return to an emergency department for further evaluation.

## 2021-09-27 NOTE — ED Provider Notes (Signed)
[facility] EMERGENCY DEPARTMENT Provider Note   CSN: [id number] Arrival date & time: 09/27/21  0020     History Chief Complaint  Patient presents with   Syncope    Frank Berg is a 65 y.o. male.  HPI  65 year old male with past medical history of atrial fibrillation anticoagulated on Xarelto, CAD, DM, HLD, HTN, CHF presents to the emergency department after syncopal episode.  Patient states over the weekend he has been feeling generally weak.  He has been having exertional shortness of breath and sometimes feels fatigued enough like he is going to pass out.  Yesterday morning while he was sitting on the couch and doing laundry he stood up to move the basket and this is when he woke up on the floor.  Presumed syncope with LOC of a couple minutes.  Otherwise denies any chest pain, palpitations, lower extremity swelling.  Patient states has been compliant with his medications.  Was recently referred to the CHF clinic and follows with Dr. Sung Amabile  Past Medical History:  Diagnosis Date   Atrial fibrillation ([place])    Coronary artery disease    Diabetes mellitus without complication ([place])    Diabetic polyneuropathy ([place])    GERD (gastroesophageal reflux disease)    Gout, unspecified    HLD (hyperlipidemia)    Hx of adenomatous polyp of colon 12/03/2020   Hypertension    OSA on CPAP    Sleep apnea     Patient Active Problem List   Diagnosis Date Noted   Abnormal findings on diagnostic imaging of liver and biliary tract 05/25/2021   Benign prostatic hyperplasia with lower urinary tract symptoms 41/96/2229   Diastolic dysfunction 79/89/2119   Ischemic cardiomyopathy 05/25/2021   Long term (current) use of anticoagulants 05/25/2021   Unintentional weight loss 05/25/2021   Decompensated heart failure ([place]) 41/74/0814   Systolic CHF, chronic (HCC)    Hemoptysis 03/11/2021   Near syncope 02/12/2021   CKD (chronic kidney disease), 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    Chronic diastolic CHF (congestive heart failure) ([place]) 02/12/2021   Anticoagulated by anticoagulation treatment 01/05/2021   Bilateral impacted cerumen 01/05/2021   Hx of adenomatous polyp of colon 12/03/2020   Coronary artery disease due to lipid rich plaque 12/27/2018   Atrial fibrillation ([place]) 12/27/2018   Obesity 09/10/2018   Right-sided Bell's palsy 07/30/2018   Raynaud's disease 11/06/2017   Male erectile disorder 09/13/2017   CAD (coronary artery disease) 06/20/2017   Hyperlipidemia 06/20/2017   PAF (paroxysmal atrial fibrillation) ([place]) 06/20/2017   Gout 05/22/2017   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[place]) 48/18/5631   Acute systolic CHF (congestive heart failure) ([place]) 10/10/2016   Elevated troponin 10/09/2016   Benign essential hypertension 09/05/2016   Healthcare maintenance 09/05/2016   Need for immunization against influenza 09/05/2016   Pure hypercholesterolemia 09/05/2016   Typical atrial flutter ([place]) 07/19/2016   S/P cryoablation of arrhythmia 06/02/2016   OSA on CPAP 06/02/2016   Type 2 diabetes mellitus ([place]) 06/02/2016   Bilateral edema of lower extremity 05/02/2016   Presence of prosthetic heart valve 05/02/2016   DOE (dyspnea on exertion) 05/02/2016    Past Surgical History:  Procedure Laterality Date   ABLATION     AORTIC VALVE REPLACEMENT  2011   CERVICAL DISC SURGERY     spinal stenosis   COLONOSCOPY     ESOPHAGOGASTRODUODENOSCOPY     RIGHT/LEFT HEART CATH AND CORONARY/GRAFT ANGIOGRAPHY N/A 03/18/2021   Procedure: RIGHT/LEFT HEART CATH AND CORONARY/GRAFT ANGIOGRAPHY;  Surgeon: Jettie Booze, MD;  Location: [facility];  Service: Cardiovascular;  Laterality: N/A;   TONSILLECTOMY         Family History  Problem Relation Age of Onset   Congenital heart disease Mother    Diabetes Mother    Hypertension Mother    Heart disease Mother    Heart attack Father    Cervical cancer Sister    Diabetes Sister    Hypertension  Brother    Diabetes Brother    Pancreatic cancer Brother    Diabetes Brother    Breast cancer Sister    Diabetes Sister    Diabetes Sister    Diabetes Sister    Diabetes Sister    Diabetes Son    Colon cancer Neg Hx    Colon polyps Neg Hx    Esophageal cancer Neg Hx    Stomach cancer Neg Hx     Social History   Tobacco Use   Smoking status: Former    Packs/day: 0.25    Years: 10.00    Pack years: 2.50    Types: Cigarettes    Quit date: 11/03/1981    Years since quitting: 39.9   Smokeless tobacco: Never  Vaping Use   Vaping Use: Never used  Substance Use Topics   Alcohol use: Yes    Comment: occasional   Drug use: No    Home Medications Prior to Admission medications   Medication Sig Start Date End Date Taking? Authorizing Provider  allopurinol (ZYLOPRIM) 300 MG tablet Take 300 mg by mouth daily. 11/23/19   [provider]  bisoprolol (ZEBETA) 5 MG tablet Take 0.5 tablets (2.5 mg total) by mouth daily. 03/23/21   Jettie Booze, MD  clotrimazole-betamethasone (LOTRISONE) lotion Apply topically as needed.    [provider]  colchicine 0.6 MG tablet Take 0.6 mg by mouth as needed.    [provider]  empagliflozin (JARDIANCE) 10 MG TABS tablet Take 1 tablet (10 mg total) by mouth daily before breakfast. 04/13/21   Jettie Booze, MD  furosemide (LASIX) 40 MG tablet Take 1 tablet (40 mg total) by mouth daily. 04/08/21   Jettie Booze, MD  glucose blood (ONETOUCH VERIO) test strip TEST ONCE A DAY DX E11.29    [provider]  magnesium oxide (MAG-OX) 400 MG tablet Take 400 mg by mouth daily. 12/11/19   [provider]  metFORMIN (GLUCOPHAGE) 500 MG tablet Take 1 tablet (500 mg total) by mouth 2 (two) times daily with a meal. 03/20/21   Jettie Booze, MD  rosuvastatin (CRESTOR) 5 MG tablet Take 5 mg by mouth every evening. 08/26/16   [provider]  spironolactone (ALDACTONE) 25 MG tablet Take 0.5  tablets (12.5 mg total) by mouth daily. 08/23/21   Bensimhon, Shaune Pascal, MD  tadalafil, PAH, (ADCIRCA) 20 MG tablet Take 20 mg by mouth daily as needed. 05/06/21   [provider]  triamcinolone (KENALOG) 0.025 % ointment Apply 1 application topically as needed.    [provider]  XARELTO 20 MG TABS tablet Take 1 tablet (20 mg total) by mouth every evening. 03/19/21   Jettie Booze, MD    Allergies    Lipitor [atorvastatin] and Penicillins  Review of Systems   Review of Systems  Constitutional:  Positive for fatigue. Negative for chills and fever.  HENT:  Negative for congestion.   Eyes:  Negative for visual disturbance.  Respiratory:  Positive for shortness of breath.  Cardiovascular:  Negative for chest pain, palpitations and leg swelling.  Gastrointestinal:  Negative for abdominal pain, diarrhea and vomiting.  Genitourinary:  Negative for dysuria.  Musculoskeletal:  Negative for back pain and neck pain.  Skin:  Negative for rash.  Neurological:  Positive for syncope. Negative for headaches.   Physical Exam Updated Vital Signs BP 113/63   Pulse (!) 56   Temp (!) 97.5 F (36.4 C) (Oral)   Resp 16   SpO2 100%   Physical Exam Vitals and nursing note reviewed.  Constitutional:      General: He is not in acute distress.    Appearance: Normal appearance. He is not diaphoretic.  HENT:     Head: Normocephalic.     Mouth/Throat:     Mouth: Mucous membranes are moist.  Eyes:     Pupils: Pupils are equal, round, and reactive to light.  Cardiovascular:     Rate and Rhythm: Normal rate.  Pulmonary:     Effort: Pulmonary effort is normal. No respiratory distress.     Breath sounds: No rales.  Abdominal:     Palpations: Abdomen is soft.     Tenderness: There is no abdominal tenderness.  Musculoskeletal:        General: No swelling.     Cervical back: No rigidity or tenderness.  Skin:    General: Skin is warm.  Neurological:     Mental Status: He is  alert and oriented to person, place, and time. Mental status is at baseline.  Psychiatric:        Mood and Affect: Mood normal.    ED Results / Procedures / Treatments   Labs (all labs ordered are listed, but only abnormal results are displayed) Labs Reviewed  COMPREHENSIVE METABOLIC PANEL - Abnormal; Notable for the following components:      Result Value   Sodium 131 (*)    Chloride 97 (*)    Glucose, Bld 105 (*)    BUN 39 (*)    Creatinine, Ser 1.76 (*)    GFR, Estimated 42 (*)    All other components within normal limits  BRAIN NATRIURETIC PEPTIDE - Abnormal; Notable for the following components:   B Natriuretic Peptide 431.9 (*)    All other components within normal limits  TROPONIN I (HIGH SENSITIVITY) - Abnormal; Notable for the following components:   Troponin I (High Sensitivity) 91 (*)    All other components within normal limits  TROPONIN I (HIGH SENSITIVITY) - Abnormal; Notable for the following components:   Troponin I (High Sensitivity) 81 (*)    All other components within normal limits  RESP PANEL BY RT-PCR (FLU A&B, COVID) [id number]  CBC WITH DIFFERENTIAL/PLATELET    EKG EKG Interpretation  Date/Time:  Monday September 27 2021 00:37:12 EST Ventricular Rate:  56 PR Interval:  226 QRS Duration: 156 QT Interval:  484 QTC Calculation: 467 R Axis:   -54 Text Interpretation: Sinus bradycardia with 1st degree A-V block Left axis deviation Right bundle branch block Abnormal ECG No significant change since last tracing Confirmed by Ripley Fraise [phone]) on 09/27/2021 2:54:48 AM  Radiology DG Chest 2 View  Result Date: 09/27/2021 CLINICAL DATA:  Recent syncopal episode EXAM: CHEST - 2 VIEW COMPARISON:  02/12/2021 FINDINGS: Cardiac shadow is within normal limits. Postsurgical changes are seen. Lungs are well aerated bilaterally. Some scarring is noted along the lateral aspect of the right lung. No acute bony abnormality is seen. IMPRESSION: Chronic changes without  acute abnormality. Electronically Signed  By: Inez Catalina M.D.   On: 09/27/2021 01:48    Procedures Procedures   Medications Ordered in ED Medications - No data to display  ED Course  I have reviewed the triage vital signs and the nursing notes.  Pertinent labs & imaging results that were available during my care of the patient were reviewed by me and considered in my medical decision making (see chart for details).    MDM Rules/Calculators/A&P                           65 year old male presents emergency department after syncopal episode getting up from the couch.  He has otherwise been in his usual state of health.  He has been having intermittent shortness of breath over the weekend with no swelling of his lower extremities.  No chest pain.  Head CT is unremarkable.  Patient is bradycardic in the 50s which is baseline for him, stable blood pressure.  EKG is unchanged for the patient.  Blood work shows a baseline BNP.  Troponin is 91 trending to 81, appears chronically elevated for the patient.  He recently had a cath and echo done in April/May of this year.  He has had PVCs on the monitor but no other noted arrhythmias.  Does not appear to be in acute CHF.  He is compliant and anticoagulated, lower suspicion for PE at this time.  Spoke with on-call cardiologist Dr. Marlou Porch.  We reviewed the patient's work-up including his recent cath and echo.  He has an outpatient follow-up appointment with the cardiologist in 2 days.  Dr. Marlou Porch will make note of this and arrange for further cardiac testing as an outpatient.  However we both feel with his reassuring and what appears to be baseline work-up that he is suitable for outpatient follow-up.  Cardiology does recommend that he stops his Lasix dose daily as this could have been a positional/hypovolemia like event.  Patient has been instructed to discontinue his Lasix.  I have relayed the information that if he gains more than 3 pounds in a day he  may restart the Lasix.  Patient has been ambulatory in the department with no further symptoms, appears well and we will plan for outpatient follow-up.  Patient at this time appears safe and stable for discharge and will be treated as an outpatient.  Discharge plan and strict return to ED precautions discussed, patient verbalizes understanding and agreement.   Clinical Impression(s) / ED Diagnoses Final diagnoses:  None    Rx / DC Orders ED Discharge Orders     None        Lorelle Gibbs, DO 09/27/21 1207

## 2021-09-27 NOTE — ED Triage Notes (Signed)
Pt presents having had syncopal episode at home States he was folding laundry and went to move laundry basket and woke up on the floor.  Pt has significant heart failure hx and is followed by Dr. Haroldine Laws and had been feeling weak and was going to call him tomorrow morning but then this happened this evening so he felt he needed to be evaluated.

## 2021-09-28 ENCOUNTER — Other Ambulatory Visit: Payer: Self-pay | Admitting: Physician Assistant

## 2021-09-28 ENCOUNTER — Ambulatory Visit (INDEPENDENT_AMBULATORY_CARE_PROVIDER_SITE_OTHER): Payer: Medicare Other

## 2021-09-28 ENCOUNTER — Telehealth (HOSPITAL_COMMUNITY): Payer: Self-pay | Admitting: *Deleted

## 2021-09-28 DIAGNOSIS — R55 Syncope and collapse: Secondary | ICD-10-CM

## 2021-09-28 NOTE — Telephone Encounter (Signed)
Received two voice messages from pts niece Bridgette requesting a return call. Bridgette did not leave any details other than requesting a call back. I called her back no answer/left vm for her to return call and requested for her to leave a detailed message if I did not answer.

## 2021-09-28 NOTE — Progress Notes (Unsigned)
Enrolled patient for a 14 day Zio AT monitor to be mailed to patients home. Order was changed from a Zio XT to a Zio AT and was ok'd by [place]  Dr. Irish Lack to read

## 2021-09-28 NOTE — Telephone Encounter (Signed)
Spoke with the patient and advised him that he has an appointment on Thursday 12/8 at 11:00am with Dr. Haroldine Laws in the [facility]. Patient also made aware that he has an appointment with Dr. Irish Lack on 12/15. Patient verbalized understanding.

## 2021-09-29 ENCOUNTER — Telehealth (HOSPITAL_COMMUNITY): Payer: Self-pay | Admitting: Licensed Clinical Social Worker

## 2021-09-29 NOTE — Telephone Encounter (Signed)
CSW had VM from pt niece Shelton Silvas requesting call back.  CSW called pt to discuss what niece wanted to inquire and to make sure it was alright to speak with her.    Pt reports she just has concerns and questions about pt medical condition- CSW explained that pt has MD appt tomorrow and the plan was to get clarification of medical condition at that time- already planned to have his therapist on the phone with him during appt so they could help him keep track of what the MDs are saying- encouraged him to call his niece with his therapist following this visit so they can fill her in on pt status.  Pt also had questions about PCS services- says it is getting difficult for him to clean his house due to fatigue.  Explained that insurance would only pay for short term services usually after an acute event that caused a change in medical status- also explained this would not normally include house cleaning but more help with ADLs.  Pt was considering paying for some help from a young man that he knows- CSW explained at this time private pay is the best way to obtain assistance with house cleaning.  No further questions at this time- confirmed appt details with pt for tomorrow  Jorge Ny, [facility] Desk#: [phone] Cell#: [phone]

## 2021-09-30 ENCOUNTER — Other Ambulatory Visit: Payer: Self-pay

## 2021-09-30 ENCOUNTER — Ambulatory Visit (HOSPITAL_COMMUNITY)
Admission: RE | Admit: 2021-09-30 | Discharge: 2021-09-30 | Disposition: A | Discharge status: Home / Self Care | Payer: Medicare Other | Source: Ambulatory Visit | Attending: Cardiology | Admitting: Cardiology

## 2021-09-30 DIAGNOSIS — I5022 Chronic systolic (congestive) heart failure: Secondary | ICD-10-CM | POA: Diagnosis not present

## 2021-09-30 DIAGNOSIS — I951 Orthostatic hypotension: Secondary | ICD-10-CM

## 2021-09-30 DIAGNOSIS — E854 Organ-limited amyloidosis: Secondary | ICD-10-CM | POA: Diagnosis not present

## 2021-09-30 DIAGNOSIS — Z7984 Long term (current) use of oral hypoglycemic drugs: Secondary | ICD-10-CM | POA: Diagnosis not present

## 2021-09-30 DIAGNOSIS — I480 Paroxysmal atrial fibrillation: Secondary | ICD-10-CM | POA: Insufficient documentation

## 2021-09-30 DIAGNOSIS — Z7901 Long term (current) use of anticoagulants: Secondary | ICD-10-CM | POA: Diagnosis not present

## 2021-09-30 DIAGNOSIS — Z951 Presence of aortocoronary bypass graft: Secondary | ICD-10-CM | POA: Insufficient documentation

## 2021-09-30 DIAGNOSIS — G4733 Obstructive sleep apnea (adult) (pediatric): Secondary | ICD-10-CM | POA: Insufficient documentation

## 2021-09-30 DIAGNOSIS — E1122 Type 2 diabetes mellitus with diabetic chronic kidney disease: Secondary | ICD-10-CM | POA: Insufficient documentation

## 2021-09-30 DIAGNOSIS — Z953 Presence of xenogenic heart valve: Secondary | ICD-10-CM | POA: Diagnosis not present

## 2021-09-30 DIAGNOSIS — I130 Hypertensive heart and chronic kidney disease with heart failure and stage 1 through stage 4 chronic kidney disease, or unspecified chronic kidney disease: Secondary | ICD-10-CM | POA: Diagnosis not present

## 2021-09-30 DIAGNOSIS — I2510 Atherosclerotic heart disease of native coronary artery without angina pectoris: Secondary | ICD-10-CM | POA: Insufficient documentation

## 2021-09-30 DIAGNOSIS — Z79899 Other long term (current) drug therapy: Secondary | ICD-10-CM | POA: Insufficient documentation

## 2021-09-30 DIAGNOSIS — N1831 Chronic kidney disease, stage 3a: Secondary | ICD-10-CM | POA: Insufficient documentation

## 2021-09-30 DIAGNOSIS — I43 Cardiomyopathy in diseases classified elsewhere: Secondary | ICD-10-CM | POA: Insufficient documentation

## 2021-09-30 DIAGNOSIS — Z952 Presence of prosthetic heart valve: Secondary | ICD-10-CM

## 2021-09-30 DIAGNOSIS — E785 Hyperlipidemia, unspecified: Secondary | ICD-10-CM | POA: Diagnosis not present

## 2021-09-30 MED ORDER — MIDODRINE HCL 5 MG PO TABS: 5.0000 mg | ORAL_TABLET | Freq: Three times a day (TID) | ORAL | 6 refills | Status: DC

## 2021-09-30 MED ORDER — FUROSEMIDE 40 MG PO TABS: 40.0000 mg | ORAL_TABLET | ORAL | 3 refills | Status: DC | PRN

## 2021-09-30 NOTE — Progress Notes (Signed)
ADVANCED HF CLINIC CONSULT NOTE  Referring Physician: Lance Muss, MD Primary Care: Adrian Prince, MD Primary Cardiologist:Jayadeep Eldridge Dace, MD   HPI:  [name] is a 65 y/o male referred by Dr. Eldridge Dace for further evaluation of his HF.   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 Also has h/o AF s/p AF ablation at [facility] in 2017. CAD s/p previous stent (2008) followed by LIMA to LAD at time of AVR.   Admitted in 4/22 with near syncope. EF 45% by echo. RV mildly HK. RVSP with question of mild D-shaped septum  Subsequently underwent R/L cath in 5/22: mLAD 90% (patent LIMA to LAD), LCX and RCA minimal CAD.  RA 9 RV 24/0 PA 25/8 (15) PCWP 5 Fick 3.4/1.6  We saw him for the first time on 08/23/21 for AHF consult.   Says he was very active prior to this year playing pickle ball and doing line dancing and t'ai chi. However over last few months feels more SOB with less exercise tolerance. Lost 30 pounds .   We did CPX test and cMRI  CPX 09/07/21  FVC 3.06  (74%)      FEV1 2.30 (73%)        FEV1/FVC 75 (98%)        MVV 82 (58%)       BP rest: 94/64 Standing BP: 86/64 BP peak: 100/58  Peak VO2: 10.4 (36% predicted peak VO2)  VE/VCO2 slope:  48  OUES: 1.04  Peak RER: 1.09  Ventilatory Threshold: 9.3 (33% predicted or measured peak VO2)  O2pulse:  8   (46% predicted O2pulse  cMRI 09/22/21 1. Mild LVE with moderate LVH diffuse hypokinesis EF 40% 2. Markedly abnormal post gadolinium images with 41.8% myocardial uptake over all axial slices Although there is a subendocardial and mid myocardial predominance and worse uptake in the basal segments there is also transmural uptake in some areas not contained to a coronary distribution 3. Degenerative appearing Bioprosthetic AVR with mild appearing AR and thickened leaflets 4.  Mild RVE/RV hypokinesis RVEF 46%  Seen in ER over the weekend with syncope after standing. No  palpitations or CP. ER w/u unremarkable. Diuretics stopped. Returns today for f/u. Still very fatigued and dyspneic with minimal activity. Orthostasis improved. No significant LE edema off lasix. No orthopnea or PND.    Past Medical History:  Diagnosis Date   Atrial fibrillation (HCC)    Coronary artery disease    Diabetes mellitus without complication (HCC)    Diabetic polyneuropathy (HCC)    GERD (gastroesophageal reflux disease)    Gout, unspecified    HLD (hyperlipidemia)    Hx of adenomatous polyp of colon 12/03/2020   Hypertension    OSA on CPAP    Sleep apnea     Current Outpatient Medications  Medication Sig Dispense Refill   allopurinol (ZYLOPRIM) 300 MG tablet Take 300 mg by mouth daily.     bisoprolol (ZEBETA) 5 MG tablet Take 0.5 tablets (2.5 mg total) by mouth daily. 15 tablet 11   clotrimazole-betamethasone (LOTRISONE) lotion Apply topically as needed.     empagliflozin (JARDIANCE) 10 MG TABS tablet Take 1 tablet (10 mg total) by mouth daily before breakfast. 90 tablet 1   glucose blood (ONETOUCH VERIO) test strip TEST ONCE A DAY DX E11.29     magnesium oxide (MAG-OX) 400 MG tablet Take 400 mg by mouth daily.     metFORMIN (GLUCOPHAGE) 500  MG tablet Take 1 tablet (500 mg total) by mouth 2 (two) times daily with a meal.     midodrine (PROAMATINE) 5 MG tablet Take 1 tablet (5 mg total) by mouth 3 (three) times daily with meals. 90 tablet 6   rosuvastatin (CRESTOR) 5 MG tablet Take 5 mg by mouth every evening.     spironolactone (ALDACTONE) 25 MG tablet Take 0.5 tablets (12.5 mg total) by mouth daily. 45 tablet 3   tadalafil, PAH, (ADCIRCA) 20 MG tablet Take 20 mg by mouth daily as needed.     triamcinolone (KENALOG) 0.025 % ointment Apply 1 application topically as needed.     XARELTO 20 MG TABS tablet Take 1 tablet (20 mg total) by mouth every evening. 90 tablet 3   colchicine 0.6 MG tablet Take 0.6 mg by mouth as needed. (Patient not taking: Reported on 09/30/2021)      furosemide (LASIX) 40 MG tablet Take 1 tablet (40 mg total) by mouth as needed for edema. 90 tablet 3   No current facility-administered medications for this encounter.    Allergies  Allergen Reactions   Lipitor [Atorvastatin] Other (See Comments)    Body aches   Penicillins Swelling    Swollen face      Social History   Socioeconomic History   Marital status: Single    Spouse name: Not on file   Number of children: 2   Years of education: Not on file   Highest education level: Not on file  Occupational History   Occupation: retired  Tobacco Use   Smoking status: Former    Packs/day: 0.25    Years: 10.00    Pack years: 2.50    Types: Cigarettes    Quit date: 11/03/1981    Years since quitting: 39.9   Smokeless tobacco: Never  Vaping Use   Vaping Use: Never used  Substance and Sexual Activity   Alcohol use: Yes    Comment: occasional   Drug use: No   Sexual activity: Not on file  Other Topics Concern   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   Married 1 son 1 daughter   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   Other jobs including working at the [employer], providing health care for disabled, 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, 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   Social Determinants of Radio broadcast assistant Strain: Not on file  Food Insecurity: Not on file  Transportation Needs: Not on file  Physical Activity: Not on file  Stress: Not on file  Social Connections: Not on file  Intimate Partner Violence: Not on file      Family History  Problem Relation Age of Onset   Congenital heart disease Mother    Diabetes Mother    Hypertension Mother    Heart disease Mother    Heart attack Father    Cervical cancer Sister    Diabetes Sister    Hypertension  Brother    Diabetes Brother    Pancreatic cancer Brother    Diabetes Brother    Breast cancer Sister    Diabetes Sister    Diabetes Sister    Diabetes Sister    Diabetes Sister    Diabetes Son  Colon cancer Neg Hx    Colon polyps Neg Hx    Esophageal cancer Neg Hx    Stomach cancer Neg Hx     He is orthostatic on exam today Lying SBP 122 -> standing 102  PHYSICAL EXAM: General:  Thin appearing. No resp difficulty HEENT: normal Neck: supple. JVP 7. Carotids 2+ bilat; no bruits. No lymphadenopathy or thryomegaly appreciated. Cor: PMI nondisplaced. Regular rate & rhythm. No rubs, gallops or murmurs. Lungs: clear Abdomen: soft, nontender, nondistended. No hepatosplenomegaly. No bruits or masses. Good bowel sounds. Extremities: no cyanosis, clubbing, rash, tr edema Neuro: alert & orientedx3, cranial nerves grossly intact. moves all 4 extremities w/o difficulty. Affect pleasant   ASSESSMENT & PLAN:   1. Chronic systolic HF  - echo 8/75 EF 45% +LVH RV mildly HK. RVSP 37mmHG with question of mild D-shaped septum - R/L cath 5/22: mLAD 90% (patent LIMA to LAD), LCX and RCA minimal CAD.  RA 9 RV 24/0 PA 25/8 (15) PCWP 5 Fick 3.4/1.6CPX 09/07/21 - CPX 11/22: pVO2 10.4 VE/VCO2 slope:  48, pRER: 1.09  - cMRI 09/22/21: EF 40% RVEF 46%  diffuses LGE and markedly elevated ECW ~71%. degenerative appearing Bioprosthetic AVR with mild AI - He persists with NYHA IIIb-IV symptoms despite only mildly reduced EF. CPX confirms severe HF limitation - cMRI reviewed with imaging team and felt to be c/w advanced cardiac amyloidosis. - Lasix stopped due to orthostasis. 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 We discussed various types of amyloid as well as treatment strategies and trajectory. 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 Will check  SPEP/UPEP/Urine IFE and light chains, PYP and genetic testing for TTR. Will contact him by phone with results as they come back. See back in 1 month.   2. CAD - s/p previous stent 2008 (unknown vessel) & LIMA to LAD 2011 - no s/s  angina - continue statin. No ASA with Xarelto  3. Bicuspid AoV s/p bioprosthetic AVR 2011 - stable on last echo - aware of SBE prophylaxis  4. PAF - s/p AF ablation 2017 - no recurrence.  - followed by Dr. Rayann Heman - Continue Xarelto  5. OSA - compliant with CPAP  6. DM2 - continue Jardiance  7. CKD3a - baseline Scr1.4-1.6 - continue jardiance  8. Orthostatic hypotension - add midodrine  Total time spent 45 minutes. Over half that time spent discussing above.    Glori Bickers, MD  6:59 PM

## 2021-09-30 NOTE — Patient Instructions (Signed)
Start Midodrine 5 mg three times daily with meals (this helps with your dizziness). Remember to get up slowly, sit back down if dizzy.  Go to local [facility] and present your prescription for 24 hr urine. Go to medical supply shop and give your prescription for support hose.  [facility] will schedule your PYP scan and call you with information. [facility] will call you with your 1 month follow up appointment with Dr. Haroldine Laws.

## 2021-09-30 NOTE — Telephone Encounter (Signed)
Entered in error

## 2021-10-01 DIAGNOSIS — R55 Syncope and collapse: Secondary | ICD-10-CM | POA: Diagnosis not present

## 2021-10-04 LAB — MULTIPLE MYELOMA PANEL, SERUM
Albumin SerPl Elph-Mcnc: 3.8 g/dL (ref 2.9–4.4)
Albumin/Glob SerPl: 1.1 (ref 0.7–1.7)
Alpha 1: 0.3 g/dL (ref 0.0–0.4)
Alpha2 Glob SerPl Elph-Mcnc: 0.6 g/dL (ref 0.4–1.0)
B-Globulin SerPl Elph-Mcnc: 1.1 g/dL (ref 0.7–1.3)
Gamma Glob SerPl Elph-Mcnc: 1.7 g/dL (ref 0.4–1.8)
Globulin, Total: 3.8 g/dL (ref 2.2–3.9)
IgA: 264 mg/dL (ref 61–437)
IgG (Immunoglobin G), Serum: 1802 mg/dL — ABNORMAL HIGH (ref 603–1613)
IgM (Immunoglobulin M), Srm: 53 mg/dL (ref 20–172)
Total Protein ELP: 7.6 g/dL (ref 6.0–8.5)

## 2021-10-05 ENCOUNTER — Other Ambulatory Visit: Payer: Self-pay | Admitting: Internal Medicine

## 2021-10-05 ENCOUNTER — Other Ambulatory Visit (HOSPITAL_COMMUNITY): Payer: Self-pay | Admitting: Cardiology

## 2021-10-06 ENCOUNTER — Other Ambulatory Visit: Payer: Self-pay | Admitting: Interventional Cardiology

## 2021-10-06 DIAGNOSIS — I5032 Chronic diastolic (congestive) heart failure: Secondary | ICD-10-CM

## 2021-10-06 MED ORDER — EMPAGLIFLOZIN 10 MG PO TABS: 10.0000 mg | ORAL_TABLET | Freq: Every day | ORAL | 2 refills | Status: DC

## 2021-10-07 ENCOUNTER — Encounter: Payer: Self-pay | Admitting: Interventional Cardiology

## 2021-10-07 ENCOUNTER — Ambulatory Visit: Payer: Medicare Other | Admitting: Interventional Cardiology

## 2021-10-07 ENCOUNTER — Other Ambulatory Visit: Payer: Self-pay

## 2021-10-07 VITALS — BP 110/78 | HR 50 | Ht 73.0 in | Wt 196.0 lb

## 2021-10-07 DIAGNOSIS — I5022 Chronic systolic (congestive) heart failure: Secondary | ICD-10-CM | POA: Diagnosis not present

## 2021-10-07 DIAGNOSIS — N1831 Chronic kidney disease, stage 3a: Secondary | ICD-10-CM

## 2021-10-07 DIAGNOSIS — I25119 Atherosclerotic heart disease of native coronary artery with unspecified angina pectoris: Secondary | ICD-10-CM | POA: Diagnosis not present

## 2021-10-07 DIAGNOSIS — Z952 Presence of prosthetic heart valve: Secondary | ICD-10-CM

## 2021-10-07 DIAGNOSIS — E1159 Type 2 diabetes mellitus with other circulatory complications: Secondary | ICD-10-CM

## 2021-10-07 LAB — IFE+PROTEIN ELECTRO, 24-HR UR
% BETA, Urine: 16.5 %
ALBUMIN, U: 61.8 %
ALPHA 1 URINE: 1.9 %
ALPHA-2-GLOBULIN, U: 6.1 %
GAMMA GLOBULIN URINE: 13.7 %
Protein, 24H Urine: 506 mg/24 hr — ABNORMAL HIGH (ref 30–150)
Protein, Ur: 28.1 mg/dL

## 2021-10-07 LAB — FREE K+L LT CHAINS,QN,UR
Free Kappa Lt Chains,Ur: 27.86 mg/L (ref 1.17–86.46)
Free Lambda Lt Chains,Ur: 6.46 mg/L (ref 0.27–15.21)
Kappa/Lambda Ratio,U: 4.31 (ref 1.83–14.26)

## 2021-10-07 NOTE — Patient Instructions (Signed)
Medication Instructions:  Your physician recommends that you continue on your current medications as directed. Please refer to the Current Medication list given to you today.  *If you need a refill on your cardiac medications before your next appointment, please call your pharmacy*   Lab Work: none If you have labs (blood work) drawn today and your tests are completely normal, you will receive your results only by: [place] (if you have MyChart) OR A paper copy in the mail If you have any lab test that is abnormal or we need to change your treatment, we will call you to review the results.   Testing/Procedures: none   Follow-Up: At [facility], you and your health needs are our priority.  As part of our continuing mission to provide you with exceptional heart care, we have created designated Provider Care Teams.  These Care Teams include your primary Cardiologist (physician) and Advanced Practice Providers (APPs -  Physician Assistants and Nurse Practitioners) who all work together to provide you with the care you need, when you need it.  We recommend signing up for the patient portal called "MyChart".  Sign up information is provided on this After Visit Summary.  MyChart is used to connect with patients for Virtual Visits (Telemedicine).  Patients are able to view lab/test results, encounter notes, upcoming appointments, etc.  Non-urgent messages can be sent to your provider as well.   To learn more about what you can do with MyChart, go to [email or web address].    Your next appointment:   As needed with Dr Irish Lack.    The format for your next appointment:   In Person  Provider:   Larae Grooms, MD     Other Instructions

## 2021-10-07 NOTE — Progress Notes (Signed)
Cardiology Office Note   Date:  10/07/2021   ID:  Frank, Berg 1956-02-23, MRN [id number]  PCP:  [name], MD    No chief complaint on file.  CAD  Wt Readings from Last 3 Encounters:  10/07/21 196 lb (88.9 kg)  08/23/21 197 lb 9.6 oz (89.6 kg)  07/06/21 201 lb 6.4 oz (91.4 kg)       History of Present Illness: Frank Berg is a 65 y.o. male   with a history of aortic valve replacement in 2011.   He had a heart murmur diagnosed at age 25.  Presumably, this was from a bicuspid valve.     In 2011, he had an AVR at age 89.  It was a bovine valve.  He takes SBE prophylaxis with doxycycline.  He had a stent placed in 2008.  He had a one vessel CABG at the time of valve replacement.  Chest burning was anginal sx.   He had AFib and subsequently had an AFib ablation in 12/17.  No recurrences of the AFib.    He moved to [facility] from [place] to be in a warmer place with lower taxes.   He has been on Xarelto since that time.  He wanted to come off Xarelto after ablation.   His sx with AFib were DOE.  He could not sense palpitatons.  He took amiodarone and metoprolol for a short time before the ablation.   Note from [facility] 2018, Dr. Lubertha Basque states: "S/P cryoablation of arrhythmia  10/07/16: Cryoablation/PVI and RFA of baseline AFLutter Ablated to sinus Then transseptal to LA Isolated PVs    [name] is doing very well after ablation for atrial flutter and fibrillation, maintaining sinus rhythm. He had his amiodarone stopped, and continues to maintain sinus.    We also discussed anticoagulation it is unclear as to whether is ever safe to come off anticoagulation when the risk score suggests a patient with atrial fibrillation should be on, and since he has only been off amiodarone for a month or 2 we will keep him on anticoagulation for the short term. Maybe at the year mark if he has no more atrial fibrillation we can contemplate coming off if he is willing to  accept the risk of recurrent, asymptomatic atrial fibrillation."   He saw Dr. Rayann Heman in 2019 regarding stopping anticoagulation: "We discussed that AHA/ACC/HRS guidelines would say that he should continue long term anticoagulation.  We also discussed that there is in the guidelines allowance for monitoring post ablation with an implantable loop recorder.  Risks and benefits of ILR were discussed at length today.  I think that this would be a good option for him.  If he has no afib on ILR then we could stop anticoagualtion and then resume if ever AF was detected."   He decided not to have ILR placed.    His son got sick with COVID in 2020 and was in the ICU.  Son recovered after being intubated.   In 01/2020, he had ortho issues that have limited walking.  He had dizziness with change in positions.     Hospitalized in 01/2021 with near syncope: "On presentation he was hemodynamically stable.  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  Troponins were mildly elevated.  BNP elevated at 299.  Patient was admitted for the work-up of near syncope. Cardiology was also consulted after admission because of elevated troponin.  He did not complain  of any chest pain during this hospitalization.  Echocardiogram showed ejection fraction of 45%, global hypokinesis, moderate left ventricular hypertrophy, grade 2 diastolic's dysfunction, right ventricular enlargement.  Cardiology recommended outpatient follow-up and consideration of right / left heart catheterization for further evaluation which will be done as an outpatient . Venous Doppler was negative for DVT.  He does not look volume overloaded. He  was complaining of intermittent hemoptysis.  He has an appointment with pulmonology next week.  Chest x-ray done during this hospitalization did not show any acute problems."   Exercise was Tai Chi and line dancing.   Cath in 02/2021 showed: "Prox LAD lesion is 50% stenosed. Mid LAD  lesion is 90% stenosed. Patent LIMA to LAD. Circumflex and RCA appear patent with mild disease. Ao sat 99%, PA sat 56% (repeat value was 60%); PA pressure 15 mm Hg; mean PCWP 5 mm Hg; CO 3.4 L/min; CI 1.6   Medical therapy for LV dysfunction.  Will consider referral to CHF clinic. To optimize his meds.  BP was somewhat low today, 97/60, which limits titration of his meds."   Saw PharmD in 04/2021 with plan of : "BP at goal <130/80 mmHg, actually on the soft end today despite telmisartan d/c at [facility], pt didn't start valsartan at discharge. Hoping to start [facility] but worried about hypotension. Checking BMET today with recent Jardiance start. Will plan to continue Jardiance 57m daily, bisoprolol 2.567mdaily (HR upper 50s), and furosemide 4076maily.      He has lost weight due to the stress of a divorce. He has not been eating regularly.  Activity level decreased and he had another trip to the ER for near syncope.  He was seen by heart failure and there was concern for severe cardiac amyloidosis.  He has lost 25-30 lbs over the past few months.      Past Medical History:  Diagnosis Date   [place] ([place]  Coronary artery disease    Diabetes mellitus without complication (HCC)    Diabetic polyneuropathy (HCC)    GERD (gastroesophageal reflux disease)    Gout, unspecified    HLD (hyperlipidemia)    Hx of adenomatous polyp of colon 12/03/2020   Hypertension    OSA on CPAP    Sleep apnea     Past Surgical History:  Procedure Laterality Date   ABLATION     AORTIC VALVE REPLACEMENT  2011   CERVICAL DISC SURGERY     spinal stenosis   COLONOSCOPY     ESOPHAGOGASTRODUODENOSCOPY     RIGHT/LEFT HEART CATH AND CORONARY/GRAFT ANGIOGRAPHY N/A 03/18/2021   Procedure: RIGHT/LEFT HEART CATH AND CORONARY/GRAFT ANGIOGRAPHY;  Surgeon: VarJettie BoozeD;  Location: [facility];  Service: Cardiovascular;  Laterality: N/A;   TONSILLECTOMY       Current Outpatient  Medications  Medication Sig Dispense Refill   allopurinol (ZYLOPRIM) 300 MG tablet Take 300 mg by mouth daily.     clotrimazole-betamethasone (LOTRISONE) lotion Apply topically as needed.     colchicine 0.6 MG tablet Take 0.6 mg by mouth as needed.     empagliflozin (JARDIANCE) 10 MG TABS tablet Take 1 tablet (10 mg total) by mouth daily before breakfast. 90 tablet 2   glucose blood (ONETOUCH VERIO) test strip TEST ONCE A DAY DX E11.29     magnesium oxide (MAG-OX) 400 MG tablet Take 400 mg by mouth daily.     metFORMIN (GLUCOPHAGE) 500 MG tablet Take 1 tablet (500 mg total)  by mouth 2 (two) times daily with a meal.     midodrine (PROAMATINE) 5 MG tablet Take 1 tablet (5 mg total) by mouth 3 (three) times daily with meals. 90 tablet 6   rosuvastatin (CRESTOR) 5 MG tablet Take 5 mg by mouth every evening.     spironolactone (ALDACTONE) 25 MG tablet Take 0.5 tablets (12.5 mg total) by mouth daily. 45 tablet 3   tadalafil, PAH, (ADCIRCA) 20 MG tablet Take 20 mg by mouth daily as needed.     triamcinolone (KENALOG) 0.025 % ointment Apply 1 application topically as needed.     XARELTO 20 MG TABS tablet Take 1 tablet (20 mg total) by mouth every evening. 90 tablet 3   bisoprolol (ZEBETA) 5 MG tablet Take 0.5 tablets (2.5 mg total) by mouth daily. (Patient not taking: Reported on 10/07/2021) 15 tablet 11   furosemide (LASIX) 40 MG tablet Take 1 tablet (40 mg total) by mouth as needed for edema. (Patient not taking: Reported on 10/07/2021) 90 tablet 3   No current facility-administered medications for this visit.    Allergies:   Lipitor [atorvastatin] and Penicillins    Social History:  The patient  reports that he quit smoking about 39 years ago. His smoking use included cigarettes. He has a 2.50 pack-year smoking history. He has never used smokeless tobacco. He reports current alcohol use. He reports that he does not use drugs.   Family History:  The patient's family history includes Breast cancer  in his sister; Cervical cancer in his sister; Congenital heart disease in his mother; Diabetes in his brother, brother, mother, sister, sister, sister, sister, sister, and son; Heart attack in his father; Heart disease in his mother; Hypertension in his brother and mother; Pancreatic cancer in his brother.    ROS:  Please see the history of present illness.   Otherwise, review of systems are positive for rare hemoptysis- negative CT in 11/2020.   All other systems are reviewed and negative.    PHYSICAL EXAM: VS:  BP 110/78    Pulse (!) 50    Ht _0  (1.854 m)    Wt 196 lb (88.9 kg)    SpO2 99%    BMI 25.86 kg/m  , BMI Body mass index is 25.86 kg/m. GEN: Well nourished, well developed, in no acute distress HEENT: normal Neck: no JVD, carotid bruits, or masses Cardiac: RRR; no murmurs, rubs, or gallops,no edema  Respiratory:  clear to auscultation bilaterally, normal work of breathing GI: soft, nontender, nondistended, + BS MS: no deformity or atrophy Skin: warm and dry, no rash Neuro:  Strength and sensation are intact Psych: euthymic mood, full affect     Recent Labs: 02/12/2021: Magnesium 1.7 07/06/2021: NT-Pro BNP 2,052 09/07/2021: TSH 5.417 09/27/2021: ALT 10; B Natriuretic Peptide 431.9; BUN 39; Creatinine, Ser 1.76; Hemoglobin 14.4; Platelets 264; Potassium 4.5; Sodium 131   Lipid Panel No results found for: CHOL, TRIG, HDL, CHOLHDL, VLDL, LDLCALC, LDLDIRECT   Other studies Reviewed: Additional studies/ records that were reviewed today with results demonstrating: LDL 73 in 12/21-will need to be rechecked when fasting. Labs reviewed   ASSESSMENT AND PLAN:  CAD: No angina.  Continue aggressive prevention.  Concern for amyloidosis affecting his heart.  Genetic testing pending. S/p AVR: Antibiotics for dental visits.  He is overdue.   Anticoagulated: No bleeding problems with Xarelto. Syncope: occurred with lifting a bin most recently.  Has some lightheadedness with walking.    DM: A1C 6.9.  Healthy  diet. Considering moving back to [place].  He may be traveling to [place] as well.  He has not graded drive, he would fly.   Current medicines are reviewed at length with the patient today.  The patient concerns regarding his medicines were addressed.  The following changes have been made:  No change  Labs/ tests ordered today include:  No orders of the defined types were placed in this encounter.   Recommend 150 minutes/week of aerobic exercise Low fat, low carb, high fiber diet recommended  Disposition:   FU in 1 year   Signed, Larae Grooms, MD  10/07/2021 11:43 AM    [facility] [address], [place], [place]  [address] Phone: [phone]; Fax: [phone]

## 2021-10-11 ENCOUNTER — Telehealth (HOSPITAL_COMMUNITY): Payer: Self-pay | Admitting: *Deleted

## 2021-10-11 NOTE — Telephone Encounter (Signed)
° °  PYP auth approved

## 2021-10-13 ENCOUNTER — Other Ambulatory Visit (HOSPITAL_COMMUNITY): Payer: Self-pay

## 2021-10-13 DIAGNOSIS — I5022 Chronic systolic (congestive) heart failure: Secondary | ICD-10-CM

## 2021-11-03 ENCOUNTER — Encounter (HOSPITAL_COMMUNITY)
Admission: RE | Admit: 2021-11-03 | Discharge: 2021-11-03 | Disposition: A | Discharge status: Home / Self Care | Payer: Medicare HMO | Source: Ambulatory Visit | Attending: Internal Medicine | Admitting: Internal Medicine

## 2021-11-03 ENCOUNTER — Other Ambulatory Visit: Payer: Self-pay

## 2021-11-03 DIAGNOSIS — I5022 Chronic systolic (congestive) heart failure: Secondary | ICD-10-CM | POA: Insufficient documentation

## 2021-11-03 MED ORDER — TECHNETIUM TC 99M PYROPHOSPHATE
22.0000 | Freq: Once | INTRAVENOUS | Status: DC | PRN
  Filled 2021-11-03: qty 22

## 2021-11-04 ENCOUNTER — Encounter (HOSPITAL_COMMUNITY): Payer: Self-pay | Admitting: Internal Medicine

## 2021-11-04 ENCOUNTER — Other Ambulatory Visit (HOSPITAL_COMMUNITY): Payer: Self-pay

## 2021-11-04 ENCOUNTER — Ambulatory Visit (HOSPITAL_COMMUNITY)
Admission: RE | Admit: 2021-11-04 | Discharge: 2021-11-04 | Disposition: A | Discharge status: Home / Self Care | Payer: Medicare HMO | Source: Ambulatory Visit | Attending: Internal Medicine | Admitting: Internal Medicine

## 2021-11-04 ENCOUNTER — Telehealth (HOSPITAL_COMMUNITY): Payer: Self-pay | Admitting: Pharmacist

## 2021-11-04 VITALS — BP 108/70 | HR 80 | Wt 191.8 lb

## 2021-11-04 DIAGNOSIS — G4733 Obstructive sleep apnea (adult) (pediatric): Secondary | ICD-10-CM | POA: Diagnosis not present

## 2021-11-04 DIAGNOSIS — E8589 Other amyloidosis: Secondary | ICD-10-CM | POA: Insufficient documentation

## 2021-11-04 DIAGNOSIS — I951 Orthostatic hypotension: Secondary | ICD-10-CM | POA: Diagnosis not present

## 2021-11-04 DIAGNOSIS — E854 Organ-limited amyloidosis: Secondary | ICD-10-CM | POA: Diagnosis not present

## 2021-11-04 DIAGNOSIS — I2510 Atherosclerotic heart disease of native coronary artery without angina pectoris: Secondary | ICD-10-CM | POA: Insufficient documentation

## 2021-11-04 DIAGNOSIS — Z7984 Long term (current) use of oral hypoglycemic drugs: Secondary | ICD-10-CM | POA: Insufficient documentation

## 2021-11-04 DIAGNOSIS — I25119 Atherosclerotic heart disease of native coronary artery with unspecified angina pectoris: Secondary | ICD-10-CM

## 2021-11-04 DIAGNOSIS — Z9989 Dependence on other enabling machines and devices: Secondary | ICD-10-CM | POA: Insufficient documentation

## 2021-11-04 DIAGNOSIS — E1122 Type 2 diabetes mellitus with diabetic chronic kidney disease: Secondary | ICD-10-CM | POA: Insufficient documentation

## 2021-11-04 DIAGNOSIS — I5032 Chronic diastolic (congestive) heart failure: Secondary | ICD-10-CM

## 2021-11-04 DIAGNOSIS — Z79899 Other long term (current) drug therapy: Secondary | ICD-10-CM | POA: Diagnosis not present

## 2021-11-04 DIAGNOSIS — I130 Hypertensive heart and chronic kidney disease with heart failure and stage 1 through stage 4 chronic kidney disease, or unspecified chronic kidney disease: Secondary | ICD-10-CM | POA: Diagnosis not present

## 2021-11-04 DIAGNOSIS — I480 Paroxysmal atrial fibrillation: Secondary | ICD-10-CM | POA: Insufficient documentation

## 2021-11-04 DIAGNOSIS — E1142 Type 2 diabetes mellitus with diabetic polyneuropathy: Secondary | ICD-10-CM | POA: Diagnosis not present

## 2021-11-04 DIAGNOSIS — I43 Cardiomyopathy in diseases classified elsewhere: Secondary | ICD-10-CM | POA: Diagnosis not present

## 2021-11-04 DIAGNOSIS — Z951 Presence of aortocoronary bypass graft: Secondary | ICD-10-CM | POA: Insufficient documentation

## 2021-11-04 DIAGNOSIS — I5022 Chronic systolic (congestive) heart failure: Secondary | ICD-10-CM | POA: Diagnosis not present

## 2021-11-04 DIAGNOSIS — Z7901 Long term (current) use of anticoagulants: Secondary | ICD-10-CM | POA: Insufficient documentation

## 2021-11-04 DIAGNOSIS — R0609 Other forms of dyspnea: Secondary | ICD-10-CM | POA: Insufficient documentation

## 2021-11-04 DIAGNOSIS — Z953 Presence of xenogenic heart valve: Secondary | ICD-10-CM | POA: Diagnosis not present

## 2021-11-04 DIAGNOSIS — N1831 Chronic kidney disease, stage 3a: Secondary | ICD-10-CM | POA: Insufficient documentation

## 2021-11-04 DIAGNOSIS — Z952 Presence of prosthetic heart valve: Secondary | ICD-10-CM

## 2021-11-04 LAB — BASIC METABOLIC PANEL
Anion gap: 11 (ref 5–15)
BUN: 20 mg/dL (ref 8–23)
CO2: 21 mmol/L — ABNORMAL LOW (ref 22–32)
Calcium: 9.5 mg/dL (ref 8.9–10.3)
Chloride: 104 mmol/L (ref 98–111)
Creatinine, Ser: 1.36 mg/dL — ABNORMAL HIGH (ref 0.61–1.24)
GFR, Estimated: 57 mL/min — ABNORMAL LOW (ref >60)
Glucose, Bld: 89 mg/dL (ref 70–99)
Potassium: 4.3 mmol/L (ref 3.5–5.1)
Sodium: 136 mmol/L (ref 135–145)

## 2021-11-04 LAB — BRAIN NATRIURETIC PEPTIDE: B Natriuretic Peptide: 337.1 pg/mL — ABNORMAL HIGH (ref 0.0–100.0)

## 2021-11-04 MED ORDER — VYNDAMAX 61 MG PO CAPS
1.0000 | ORAL_CAPSULE | Freq: Every day | ORAL | 11 refills | Status: DC
  Filled 2021-11-04: qty 30, fill #0

## 2021-11-04 NOTE — Telephone Encounter (Signed)
Vyndamax prescription sent to [facility]. ° °Trayvond Viets, PharmD, BCPS, BCCP, CPP °Heart Failure Clinic Pharmacist °[phone] ° °

## 2021-11-04 NOTE — Telephone Encounter (Signed)
Patient Advocate Encounter   Received notification from [place] that prior authorization for Vyndamax is required.   PA submitted on CoverMyMeds Key [id number] Status is pending   Will continue to follow.   Audry Riles, PharmD, BCPS, BCCP, CPP Heart Failure Clinic Pharmacist [phone]

## 2021-11-04 NOTE — Patient Instructions (Signed)
Labs done today, your results will be available in MyChart, we will contact you for abnormal readings.  Blood collected for TTR genetic testing per Dr Haroldine Laws.  Order form completed and both shipped by FedEx to [facility].  We will be starting you on Tafamadis, Wynell Balloon willl be in contact with you about medication  Your physician recommends that you schedule a follow-up appointment in: 3 months  If you have any questions or concerns before your next appointment please send [place] a message through [place] or call our office at [phone].    TO LEAVE A MESSAGE FOR THE NURSE SELECT OPTION 2, PLEASE LEAVE A MESSAGE INCLUDING: YOUR NAME DATE OF BIRTH CALL BACK NUMBER REASON FOR CALL**this is important as we prioritize the call backs  YOU WILL RECEIVE A CALL BACK THE SAME DAY AS LONG AS YOU CALL BEFORE 4:00 PM At the [facility], you and your health needs are our priority. As part of our continuing mission to provide you with exceptional heart care, we have created designated Provider Care Teams. These Care Teams include your primary Cardiologist (physician) and Advanced Practice Providers (APPs- Physician Assistants and Nurse Practitioners) who all work together to provide you with the care you need, when you need it.   You may see any of the following providers on your designated Care Team at your next follow up: Dr Glori Bickers Dr Haynes Kerns, NP Lyda Jester, [facility] [place], [place] Audry Riles, PharmD   Please be sure to bring in all your medications bottles to every appointment.

## 2021-11-04 NOTE — Progress Notes (Signed)
ADVANCED HF CLINIC NOTE  Referring Physician: Larae Grooms, MD Primary Care: [name], MD Primary Cardiologist:Jayadeep Irish Lack, MD   HPI:  [name] is a 66 y/o male referred by Dr. Irish Lack for further evaluation of his HF.   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 Also has h/o AF s/p AF ablation at [facility] in 2017. CAD s/p previous stent (2008) followed by LIMA to LAD at time of AVR.   Admitted in 4/22 with near syncope. EF 45% by echo. RV mildly HK. RVSP 34mmHG with question of mild D-shaped septum  Subsequently underwent R/L cath in 5/22: mLAD 90% (patent LIMA to LAD), LCX and RCA minimal CAD.  RA 9 RV 24/0 PA 25/8 (15) PCWP 5 Fick 3.4/1.6  We saw him for the first time on 08/23/21 for AHF consult.   Says he was very active prior to this year playing pickle ball and doing line dancing and t'ai chi. However over last few months feels more SOB with less exercise tolerance. Lost 30 pounds .   We did CPX test and cMRI  CPX 09/07/21  FVC 3.06  (74%)      FEV1 2.30 (73%)        FEV1/FVC 75 (98%)        MVV 82 (58%)       BP rest: 94/64 Standing BP: 86/64 BP peak: 100/58  Peak VO2: 10.4 (36% predicted peak VO2)  VE/VCO2 slope:  48  OUES: 1.04  Peak RER: 1.09  Ventilatory Threshold: 9.3 (33% predicted or measured peak VO2)  O2pulse:  8   (46% predicted O2pulse  cMRI 09/22/21 1. Mild LVE with moderate LVH diffuse hypokinesis EF 40% 2. Markedly abnormal post gadolinium images with 41.8% myocardial uptake over all axial slices Although there is a subendocardial and mid myocardial predominance and worse uptake in the basal segments there is also transmural uptake in some areas not contained to a coronary distribution 3. Degenerative appearing Bioprosthetic AVR with mild appearing AR and thickened leaflets 4.  Mild RVE/RV hypokinesis RVEF 46%  Here for f/u. At last visit added midodrine for low BP. Feeling better.  Takes lasix about 1x/week. Starting to go back to gym for light work-up.  Has some numbness and tingling in his hands. Mild DOE. No orthopnea or PND   PVP Scan 11/03/20 Ratio 2.8 Markedly positive for TTR    Past Medical History:  Diagnosis Date   Atrial fibrillation (HCC)    Coronary artery disease    Diabetes mellitus without complication (HCC)    Diabetic polyneuropathy (HCC)    GERD (gastroesophageal reflux disease)    Gout, unspecified    HLD (hyperlipidemia)    Hx of adenomatous polyp of colon 12/03/2020   Hypertension    OSA on CPAP    Sleep apnea     Current Outpatient Medications  Medication Sig Dispense Refill   allopurinol (ZYLOPRIM) 300 MG tablet Take 300 mg by mouth daily.     clotrimazole-betamethasone (LOTRISONE) lotion Apply topically as needed.     colchicine 0.6 MG tablet Take 0.6 mg by mouth as needed.     empagliflozin (JARDIANCE) 10 MG TABS tablet Take 1 tablet (10 mg total) by mouth daily before breakfast. 90 tablet 2   furosemide (LASIX) 40 MG tablet Take 1 tablet (40 mg total) by mouth as needed for edema. 90 tablet 3   glucose blood (ONETOUCH VERIO) test strip TEST ONCE A  DAY DX E11.29     magnesium oxide (MAG-OX) 400 MG tablet Take 400 mg by mouth daily.     metFORMIN (GLUCOPHAGE) 500 MG tablet Take 1 tablet (500 mg total) by mouth 2 (two) times daily with a meal.     midodrine (PROAMATINE) 5 MG tablet Take 1 tablet (5 mg total) by mouth 3 (three) times daily with meals. 90 tablet 6   rosuvastatin (CRESTOR) 5 MG tablet Take 5 mg by mouth every evening.     spironolactone (ALDACTONE) 25 MG tablet Take 0.5 tablets (12.5 mg total) by mouth daily. 45 tablet 3   tadalafil, PAH, (ADCIRCA) 20 MG tablet Take 20 mg by mouth daily as needed.     triamcinolone (KENALOG) 0.025 % ointment Apply 1 application topically as needed.     XARELTO 20 MG TABS tablet Take 1 tablet (20 mg total) by mouth every evening. 90 tablet 3   No current facility-administered medications  for this encounter.   Facility-Administered Medications Ordered in Other Encounters  Medication Dose Route Frequency Provider Last Rate Last Admin   technetium pyrophosphate Tc 66m injection 22 millicurie  22 millicurie Intravenous Once PRN Macy Mis, MD        Allergies  Allergen Reactions   Lipitor [Atorvastatin] Other (See Comments)    Body aches   Penicillins Swelling    Swollen face      Social History   Socioeconomic History   Marital status: Single    Spouse name: Not on file   Number of children: 2   Years of education: Not on file   Highest education level: Not on file  Occupational History   Occupation: retired  Tobacco Use   Smoking status: Former    Packs/day: 0.25    Years: 10.00    Pack years: 2.50    Types: Cigarettes    Quit date: 11/03/1981    Years since quitting: 40.0   Smokeless tobacco: Never  Vaping Use   Vaping Use: Never used  Substance and Sexual Activity   Alcohol use: Yes    Comment: occasional   Drug use: No   Sexual activity: Not on file  Other Topics Concern   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   Married 1 son 1 daughter   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   Other jobs including working at the [employer], providing health care for disabled, 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, 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   Social Determinants of Radio broadcast assistant Strain: Not on file  Food Insecurity: Not on file  Transportation Needs: Not on file  Physical Activity: Not on file  Stress: Not on file  Social Connections: Not on file  Intimate Partner Violence: Not on file      Family History  Problem Relation Age of Onset   Congenital heart disease Mother    Diabetes Mother    Hypertension Mother    Heart disease  Mother    Heart attack Father    Cervical cancer Sister    Diabetes Sister    Hypertension Brother    Diabetes Brother    Pancreatic cancer Brother    Diabetes Brother    Breast cancer Sister    Diabetes  Sister    Diabetes Sister    Diabetes Sister    Diabetes Sister    Diabetes Son    Colon cancer Neg Hx    Colon polyps Neg Hx    Esophageal cancer Neg Hx    Stomach cancer Neg Hx     Vitals:   11/04/21 1514  BP: 108/70  Pulse: 80  SpO2: 99%  Weight: 87 kg (191 lb 12.8 oz)    Wt Readings from Last 3 Encounters:  11/04/21 87 kg (191 lb 12.8 oz)  10/07/21 88.9 kg (196 lb)  08/23/21 89.6 kg (197 lb 9.6 oz)    PHYSICAL EXAM: General:  Thin appearing. No resp difficulty HEENT: normal Neck: supple. JVP 7. Carotids 2+ bilat; no bruits. No lymphadenopathy or thryomegaly appreciated. Cor: PMI nondisplaced. Regular rate & rhythm. No rubs, gallops or murmurs. Lungs: clear Abdomen: soft, nontender, mildly distended. No hepatosplenomegaly. No bruits or masses. Good bowel sounds. Extremities: no cyanosis, clubbing, rash, tr edema Neuro: alert & orientedx3, cranial nerves grossly intact. moves all 4 extremities w/o difficulty. Affect pleasant   ASSESSMENT & PLAN:   1. Chronic systolic HF  - echo 6/01 EF 45% +LVH RV mildly HK. RVSP 48mmHG with question of mild D-shaped septum - R/L cath 5/22: mLAD 90% (patent LIMA to LAD), LCX and RCA minimal CAD.  RA 9 RV 24/0 PA 25/8 (15) PCWP 5 Fick 3.4/1.6CPX 09/07/21 - CPX 11/22: pVO2 10.4 VE/VCO2 slope:  48, pRER: 1.09  - cMRI 09/22/21: EF 40% RVEF 46%  diffuses LGE and markedly elevated ECW ~71%. degenerative appearing Bioprosthetic AVR with mild AI - Somewhat improved NYHA III.CPX confirms severe HF limitation - cMRI reviewed with imaging team and felt to be c/w advanced cardiac amyloidosis.\ - PYP 1/22 markedly positive. Myeloma negative - Lasix stopped due to orthostasis. Using on prn basis. Volume status mildly elevated today.  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 Will need to see Neurology  3. CAD - s/p previous stent 2008 (unknown vessel) & LIMA to LAD 2011 - no s/s  angina - continue statin. No ASA with Xarelto  4.  Bicuspid AoV s/p bioprosthetic AVR 2011 - stable on last echo - aware of SBE prophylaxis  4. PAF - s/p AF ablation 2017 - no recurrence.  - followed by Dr. Rayann Heman - Continue Xarelto. No bleeding  5. OSA - compliant with CPAP  6. DM2 - continue Jardiance  7. CKD3a - baseline Scr1.4-1.6 - continue jardiance  8. Orthostatic hypotension - Improved on midodrine  Glori Bickers, MD  3:43 PM

## 2021-11-05 ENCOUNTER — Other Ambulatory Visit (HOSPITAL_COMMUNITY): Payer: Self-pay

## 2021-11-05 NOTE — Telephone Encounter (Signed)
Advanced Heart Failure Patient Advocate Encounter  Prior Authorization for [employer] [place] has been approved.    Effective dates: 10/24/2021 - 10/23/2022  Audry Riles, PharmD, BCPS, BCCP, CPP Heart Failure Clinic Pharmacist [phone]

## 2021-11-09 ENCOUNTER — Other Ambulatory Visit (HOSPITAL_COMMUNITY): Payer: Self-pay

## 2021-11-09 MED ORDER — VYNDAMAX 61 MG PO CAPS
1.0000 | ORAL_CAPSULE | Freq: Every day | ORAL | 3 refills | Status: DC
  Filled 2021-11-09: qty 90, 90d supply, fill #0
  Filled 2022-01-25: qty 90, 90d supply, fill #1
  Filled 2022-04-18: qty 90, 90d supply, fill #2
  Filled 2022-07-05: qty 90, 90d supply, fill #3

## 2021-11-09 NOTE — Telephone Encounter (Signed)
Advanced Heart Failure Patient Advocate Encounter  90 day co-pay for Vyndamax, $25. Will set patient up with [facility] to coordinate refills and delivery each month. Called and spoke with patient, went over specialty pharmacy program. Obtained payment information and gave it to the pharmacy.   Charlann Boxer, CPhT

## 2021-11-10 ENCOUNTER — Ambulatory Visit: Payer: Medicare Other | Admitting: Podiatry

## 2021-11-11 ENCOUNTER — Other Ambulatory Visit (HOSPITAL_COMMUNITY): Payer: Self-pay | Admitting: Internal Medicine

## 2021-11-23 ENCOUNTER — Other Ambulatory Visit (HOSPITAL_COMMUNITY): Payer: Self-pay | Admitting: *Deleted

## 2021-11-23 DIAGNOSIS — E1159 Type 2 diabetes mellitus with other circulatory complications: Secondary | ICD-10-CM

## 2021-12-02 ENCOUNTER — Other Ambulatory Visit (HOSPITAL_COMMUNITY): Payer: Self-pay

## 2021-12-08 ENCOUNTER — Encounter (HOSPITAL_COMMUNITY): Payer: Self-pay | Admitting: Internal Medicine

## 2021-12-08 ENCOUNTER — Telehealth (HOSPITAL_COMMUNITY): Payer: Self-pay | Admitting: *Deleted

## 2021-12-08 NOTE — Telephone Encounter (Signed)
Pt  left vm aking for a referral to [facility] amyloidosis treatment center.   Routed to [facility]  Madaline Brilliant to refer?)   Fax # [phone]

## 2021-12-09 ENCOUNTER — Telehealth (HOSPITAL_COMMUNITY): Payer: Self-pay

## 2021-12-09 NOTE — Telephone Encounter (Signed)
Pt. Called to say that he has been feeling unwell the last few days with loss of appetite, unable to taste and general fatigue. Also stated has been slightly more short of breath as well, but hasn't taken any lasix in a week. Advised him to take a dose of lasix and go get tested for the flu and covid due to the symptoms he stated he has.

## 2021-12-10 NOTE — Telephone Encounter (Signed)
Referral faxed

## 2021-12-14 ENCOUNTER — Ambulatory Visit: Payer: Medicare Other | Admitting: Podiatry

## 2021-12-16 ENCOUNTER — Encounter (HOSPITAL_COMMUNITY): Payer: Self-pay | Admitting: Emergency Medicine

## 2021-12-16 ENCOUNTER — Emergency Department (HOSPITAL_COMMUNITY): Payer: Medicare HMO

## 2021-12-16 ENCOUNTER — Other Ambulatory Visit: Payer: Self-pay

## 2021-12-16 ENCOUNTER — Inpatient Hospital Stay (HOSPITAL_COMMUNITY)
Admission: EM | Admit: 2021-12-16 | Discharge: 2021-12-20 | DRG: 291 | Disposition: A | Discharge status: Home / Self Care | Payer: Medicare HMO | Attending: Internal Medicine | Admitting: Internal Medicine

## 2021-12-16 DIAGNOSIS — Z79899 Other long term (current) drug therapy: Secondary | ICD-10-CM

## 2021-12-16 DIAGNOSIS — I480 Paroxysmal atrial fibrillation: Secondary | ICD-10-CM | POA: Diagnosis present

## 2021-12-16 DIAGNOSIS — J9601 Acute respiratory failure with hypoxia: Secondary | ICD-10-CM

## 2021-12-16 DIAGNOSIS — R0602 Shortness of breath: Secondary | ICD-10-CM | POA: Diagnosis not present

## 2021-12-16 DIAGNOSIS — E1142 Type 2 diabetes mellitus with diabetic polyneuropathy: Secondary | ICD-10-CM | POA: Diagnosis present

## 2021-12-16 DIAGNOSIS — E854 Organ-limited amyloidosis: Secondary | ICD-10-CM | POA: Diagnosis present

## 2021-12-16 DIAGNOSIS — Z953 Presence of xenogenic heart valve: Secondary | ICD-10-CM

## 2021-12-16 DIAGNOSIS — I5043 Acute on chronic combined systolic (congestive) and diastolic (congestive) heart failure: Secondary | ICD-10-CM | POA: Diagnosis present

## 2021-12-16 DIAGNOSIS — G4737 Central sleep apnea in conditions classified elsewhere: Secondary | ICD-10-CM

## 2021-12-16 DIAGNOSIS — E872 Acidosis, unspecified: Secondary | ICD-10-CM | POA: Diagnosis present

## 2021-12-16 DIAGNOSIS — Z20822 Contact with and (suspected) exposure to covid-19: Secondary | ICD-10-CM | POA: Diagnosis present

## 2021-12-16 DIAGNOSIS — I440 Atrioventricular block, first degree: Secondary | ICD-10-CM | POA: Diagnosis present

## 2021-12-16 DIAGNOSIS — G4733 Obstructive sleep apnea (adult) (pediatric): Secondary | ICD-10-CM | POA: Diagnosis present

## 2021-12-16 DIAGNOSIS — M109 Gout, unspecified: Secondary | ICD-10-CM | POA: Diagnosis present

## 2021-12-16 DIAGNOSIS — Z800 Family history of malignant neoplasm of digestive organs: Secondary | ICD-10-CM

## 2021-12-16 DIAGNOSIS — Z951 Presence of aortocoronary bypass graft: Secondary | ICD-10-CM

## 2021-12-16 DIAGNOSIS — Z888 Allergy status to other drugs, medicaments and biological substances status: Secondary | ICD-10-CM

## 2021-12-16 DIAGNOSIS — J9811 Atelectasis: Secondary | ICD-10-CM | POA: Diagnosis present

## 2021-12-16 DIAGNOSIS — Z87891 Personal history of nicotine dependence: Secondary | ICD-10-CM

## 2021-12-16 DIAGNOSIS — Z833 Family history of diabetes mellitus: Secondary | ICD-10-CM

## 2021-12-16 DIAGNOSIS — Z8249 Family history of ischemic heart disease and other diseases of the circulatory system: Secondary | ICD-10-CM

## 2021-12-16 DIAGNOSIS — Z880 Allergy status to penicillin: Secondary | ICD-10-CM

## 2021-12-16 DIAGNOSIS — I509 Heart failure, unspecified: Secondary | ICD-10-CM

## 2021-12-16 DIAGNOSIS — E871 Hypo-osmolality and hyponatremia: Secondary | ICD-10-CM | POA: Diagnosis present

## 2021-12-16 DIAGNOSIS — I4510 Unspecified right bundle-branch block: Secondary | ICD-10-CM | POA: Diagnosis present

## 2021-12-16 DIAGNOSIS — Z7984 Long term (current) use of oral hypoglycemic drugs: Secondary | ICD-10-CM

## 2021-12-16 DIAGNOSIS — I130 Hypertensive heart and chronic kidney disease with heart failure and stage 1 through stage 4 chronic kidney disease, or unspecified chronic kidney disease: Secondary | ICD-10-CM | POA: Diagnosis not present

## 2021-12-16 DIAGNOSIS — E1122 Type 2 diabetes mellitus with diabetic chronic kidney disease: Secondary | ICD-10-CM | POA: Diagnosis present

## 2021-12-16 DIAGNOSIS — Z7901 Long term (current) use of anticoagulants: Secondary | ICD-10-CM

## 2021-12-16 DIAGNOSIS — E785 Hyperlipidemia, unspecified: Secondary | ICD-10-CM | POA: Diagnosis present

## 2021-12-16 DIAGNOSIS — I340 Nonrheumatic mitral (valve) insufficiency: Secondary | ICD-10-CM | POA: Diagnosis present

## 2021-12-16 DIAGNOSIS — R001 Bradycardia, unspecified: Secondary | ICD-10-CM | POA: Diagnosis present

## 2021-12-16 DIAGNOSIS — K219 Gastro-esophageal reflux disease without esophagitis: Secondary | ICD-10-CM | POA: Diagnosis present

## 2021-12-16 DIAGNOSIS — R072 Precordial pain: Secondary | ICD-10-CM

## 2021-12-16 DIAGNOSIS — N1832 Chronic kidney disease, stage 3b: Secondary | ICD-10-CM | POA: Diagnosis present

## 2021-12-16 DIAGNOSIS — Z66 Do not resuscitate: Secondary | ICD-10-CM | POA: Diagnosis present

## 2021-12-16 DIAGNOSIS — I2510 Atherosclerotic heart disease of native coronary artery without angina pectoris: Secondary | ICD-10-CM | POA: Diagnosis present

## 2021-12-16 LAB — CBC WITH DIFFERENTIAL/PLATELET
Abs Immature Granulocytes: 0.01 10*3/uL (ref 0.00–0.07)
Basophils Absolute: 0.1 10*3/uL (ref 0.0–0.1)
Basophils Relative: 1 %
Eosinophils Absolute: 0.1 10*3/uL (ref 0.0–0.5)
Eosinophils Relative: 2 %
HCT: 44.1 % (ref 39.0–52.0)
Hemoglobin: 14.3 g/dL (ref 13.0–17.0)
Immature Granulocytes: 0 %
Lymphocytes Relative: 35 %
Lymphs Abs: 1.7 10*3/uL (ref 0.7–4.0)
MCH: 28.5 pg (ref 26.0–34.0)
MCHC: 32.4 g/dL (ref 30.0–36.0)
MCV: 87.8 fL (ref 80.0–100.0)
Monocytes Absolute: 0.4 10*3/uL (ref 0.1–1.0)
Monocytes Relative: 8 %
Neutro Abs: 2.5 10*3/uL (ref 1.7–7.7)
Neutrophils Relative %: 54 %
Platelets: 288 10*3/uL (ref 150–400)
RBC: 5.02 MIL/uL (ref 4.22–5.81)
RDW: 16.1 % — ABNORMAL HIGH (ref 11.5–15.5)
WBC: 4.8 10*3/uL (ref 4.0–10.5)
nRBC: 0 % (ref 0.0–0.2)

## 2021-12-16 LAB — RESP PANEL BY RT-PCR (FLU A&B, COVID) ARPGX2
Influenza A by PCR: NEGATIVE
Influenza B by PCR: NEGATIVE
SARS Coronavirus 2 by RT PCR: NEGATIVE

## 2021-12-16 LAB — BASIC METABOLIC PANEL
Anion gap: 11 (ref 5–15)
BUN: 34 mg/dL — ABNORMAL HIGH (ref 8–23)
CO2: 22 mmol/L (ref 22–32)
Calcium: 9.5 mg/dL (ref 8.9–10.3)
Chloride: 103 mmol/L (ref 98–111)
Creatinine, Ser: 1.69 mg/dL — ABNORMAL HIGH (ref 0.61–1.24)
GFR, Estimated: 44 mL/min — ABNORMAL LOW (ref >60)
Glucose, Bld: 119 mg/dL — ABNORMAL HIGH (ref 70–99)
Potassium: 4.5 mmol/L (ref 3.5–5.1)
Sodium: 136 mmol/L (ref 135–145)

## 2021-12-16 LAB — BRAIN NATRIURETIC PEPTIDE: B Natriuretic Peptide: 633.8 pg/mL — ABNORMAL HIGH (ref 0.0–100.0)

## 2021-12-16 MED ORDER — FUROSEMIDE 10 MG/ML IJ SOLN
40.0000 mg | Freq: Once | INTRAMUSCULAR | Status: AC
Start: 2021-12-16 — End: 2021-12-17
  Administered 2021-12-17: 40 mg via INTRAVENOUS
  Filled 2021-12-16: qty 4

## 2021-12-16 NOTE — ED Notes (Signed)
2L O2 via nasal cannula applied to patient.

## 2021-12-16 NOTE — ED Provider Triage Note (Signed)
Emergency Medicine Provider Triage Evaluation Note  Frank Berg , a 66 y.o. male  was evaluated in triage.  Pt complains of shortness of breath for 3 days.  Patient states the shortness of breath is worse with exertion and when lying flat.  He has associated lightheadedness.  He denies any chest pain, nausea, vomiting, abdominal pain, leg swelling.  Denies any history of blood clot, cancer, recent surgery, recent travel.  He does have a history of CHF and is seen by the heart failure clinic here.  He does not wear oxygen at home.  Review of Systems  Positive: Shortness of breath, lightheaded Negative:   Physical Exam  BP 108/80    Pulse (!) 59    Temp 98.6 F (37 C)    Resp 16    Ht 6\' 2"  (1.88 m)    Wt 83.9 kg    SpO2 95%    BMI 23.75 kg/m  Gen:   Awake, no distress   Resp:  Normal effort  MSK:   Moves extremities without difficulty  Other:  No leg swelling or tenderness. Lungs CTA. Heart RRR. No murmurs. On 2L [place] O2  Medical Decision Making  Medically screening exam initiated at 8:14 PM.  Appropriate orders placed.  Frank Berg was informed that the remainder of the evaluation will be completed by another provider, this initial triage assessment does not replace that evaluation, and the importance of remaining in the ED until their evaluation is complete.  He was hypoxic here and in up applying 2 L nasal cannula Shortness of breath labs ordered.   Adolphus Birchwood, [place] 12/16/21 2016

## 2021-12-16 NOTE — ED Provider Notes (Signed)
Emergency Department Provider Note   I have reviewed the triage vital signs and the nursing notes.   HISTORY  Chief Complaint No chief complaint on file.   HPI Frank Berg is a 66 y.o. male with PMH of CAD, PAF on Xarelto, DM, HLD, CHF (EF 45%) and amyloidosis presents to the ED for evaluation of shortness of breath worsening over the past couple of days.  He reports a "discomfort" in the chest as well that is fairly constant. No radiation of symptoms. No pleuritic pain.  He notes that he has been compliant with his anticoagulation and other medications.  His shortness of breath is worse with exertion.  He has not noticed much swelling in his legs but did take his as needed Lasix today.  He has been urinating frequently throughout the day but denies feeling much better afterward.  No fevers or chills.  No URI symptoms.   Past Medical History:  Diagnosis Date   Atrial fibrillation ([place])    Coronary artery disease    Diabetes mellitus without complication (HCC)    Diabetic polyneuropathy (HCC)    GERD (gastroesophageal reflux disease)    Gout, unspecified    HLD (hyperlipidemia)    Hx of adenomatous polyp of colon 12/03/2020   Hypertension    OSA on CPAP    Sleep apnea     Review of Systems  Constitutional: No fever/chills Eyes: No visual changes. ENT: No sore throat. Cardiovascular: Positive chest pain. Respiratory: Positive shortness of breath. Gastrointestinal: No abdominal pain.  No nausea, no vomiting.  No diarrhea.  No constipation. Genitourinary: Negative for dysuria. Musculoskeletal: Negative for back pain. Skin: Negative for rash. Neurological: Negative for headaches, focal weakness or numbness.   ____________________________________________   PHYSICAL EXAM:  VITAL SIGNS: ED Triage Vitals  Enc Vitals Group     BP 12/16/21 2013 108/80     Pulse Rate 12/16/21 2013 (!) 59     Resp 12/16/21 2013 16     Temp 12/16/21 2013 98.6 F (37 C)     Temp  src --      SpO2 12/16/21 2005 95 %     Weight 12/16/21 2005 185 lb (83.9 kg)     Height 12/16/21 2005 6\' 2"  (1.88 m)   Constitutional: Alert and oriented. Well appearing and in no acute distress. Eyes: Conjunctivae are normal.  Head: Atraumatic. Nose: No congestion/rhinnorhea. Mouth/Throat: Mucous membranes are moist.  Neck: No stridor.  Cardiovascular: Normal rate, regular rhythm. Good peripheral circulation. Grossly normal heart sounds.   Respiratory: Normal respiratory effort.  No retractions. Lungs CTAB. Gastrointestinal: Soft and nontender. No distention.  Musculoskeletal:  No gross deformities of extremities. Neurologic:  Normal speech and language. Skin:  Skin is warm, dry and intact. No rash noted.  ____________________________________________   LABS (all labs ordered are listed, but only abnormal results are displayed)  Labs Reviewed  BASIC METABOLIC PANEL - Abnormal; Notable for the following components:      Result Value   Glucose, Bld 119 (*)    BUN 34 (*)    Creatinine, Ser 1.69 (*)    GFR, Estimated 44 (*)    All other components within normal limits  CBC WITH DIFFERENTIAL/PLATELET - Abnormal; Notable for the following components:   RDW 16.1 (*)    All other components within normal limits  BRAIN NATRIURETIC PEPTIDE - Abnormal; Notable for the following components:   B Natriuretic Peptide 633.8 (*)    All other components within normal limits  TROPONIN I (HIGH SENSITIVITY) - Abnormal; Notable for the following components:   Troponin I (High Sensitivity) 86 (*)    All other components within normal limits  RESP PANEL BY RT-PCR (FLU A&B, COVID) ARPGX2   ____________________________________________  EKG   EKG Interpretation  Date/Time:  Thursday December 16 2021 19:54:03 EST Ventricular Rate:  59 PR Interval:  236 QRS Duration: 142 QT Interval:  480 QTC Calculation: 475 R Axis:   16 Text Interpretation: Sinus bradycardia with 1st degree A-V block  with Premature atrial complexes Right bundle branch block Abnormal ECG When compared with ECG of 27-Sep-2021 00:37, PREVIOUS ECG IS PRESENT Confirmed by Nanda Quinton [phone]) on 12/16/2021 10:57:45 PM        ____________________________________________  RADIOLOGY  DG Chest 2 View  Result Date: 12/16/2021 CLINICAL DATA:  Shortness of breath EXAM: CHEST - 2 VIEW COMPARISON:  Previous studies including the examination of 09/27/2021 FINDINGS: Transverse diameter of heart is increased. There is previous prosthetic aortic valve placement. There is previous surgical fusion in the lower cervical spine. There are no signs of pulmonary edema or focal pulmonary consolidation. Blunting of right lateral CP angle and pleural thickening in the lateral aspect of right lower lung fields have not changed significantly. Left lateral CP angle is clear. There is no pneumothorax. IMPRESSION: Cardiomegaly. There are no signs of pulmonary edema or new focal infiltrates. Other findings as described in the body of the report. Electronically Signed   By: Elmer Picker M.D.   On: 12/16/2021 20:52    ____________________________________________   PROCEDURES  Procedure(s) performed:   Procedures  CRITICAL CARE Performed by: Margette Fast Total critical care time: 35 minutes Critical care time was exclusive of separately billable procedures and treating other patients. Critical care was necessary to treat or prevent imminent or life-threatening deterioration. Critical care was time spent personally by me on the following activities: development of treatment plan with patient and/or surrogate as well as nursing, discussions with consultants, evaluation of patient's response to treatment, examination of patient, obtaining history from patient or surrogate, ordering and performing treatments and interventions, ordering and review of laboratory studies, ordering and review of radiographic studies, pulse oximetry and  re-evaluation of patient's condition.  Nanda Quinton, MD Emergency Medicine  ____________________________________________   INITIAL IMPRESSION / ASSESSMENT AND PLAN / ED COURSE  Pertinent labs & imaging results that were available during my care of the patient were reviewed by me and considered in my medical decision making (see chart for details).   This patient is Presenting for Evaluation of CP, which does require a range of treatment options, and is a complaint that involves a high risk of morbidity and mortality.  The Differential Diagnoses includes all life-threatening causes for chest pain. This includes but is not exclusive to acute coronary syndrome, aortic dissection, pulmonary embolism, cardiac tamponade, community-acquired pneumonia, pericarditis, musculoskeletal chest wall pain, etc.   Critical Interventions- IV lasix   Medications  allopurinol (ZYLOPRIM) tablet 300 mg (has no administration in time range)  colchicine tablet 0.6 mg (has no administration in time range)  midodrine (PROAMATINE) tablet 5 mg (has no administration in time range)  rosuvastatin (CRESTOR) tablet 5 mg (has no administration in time range)  spironolactone (ALDACTONE) tablet 12.5 mg (has no administration in time range)  Tafamidis CAPS 1 capsule (has no administration in time range)  empagliflozin (JARDIANCE) tablet 10 mg (has no administration in time range)  magnesium oxide (MAG-OX) tablet 400 mg (has no administration in time range)  rivaroxaban (XARELTO) tablet 20 mg (has no administration in time range)  loratadine (CLARITIN) tablet 10 mg (has no administration in time range)  furosemide (LASIX) injection 20 mg (20 mg Intravenous Given 12/17/21 0644)  acetaminophen (TYLENOL) tablet 650 mg (has no administration in time range)    Or  acetaminophen (TYLENOL) suppository 650 mg (has no administration in time range)  traZODone (DESYREL) tablet 25 mg (has no administration in time range)   ondansetron (ZOFRAN) tablet 4 mg (has no administration in time range)    Or  ondansetron (ZOFRAN) injection 4 mg (has no administration in time range)  magnesium hydroxide (MILK OF MAGNESIA) suspension 30 mL (has no administration in time range)  furosemide (LASIX) injection 40 mg (40 mg Intravenous Given 12/17/21 0103)    Reassessment after intervention:  Good diuresis    I decided to review pertinent External Data, and in summary patient followed by heart failure team here. Last ECHO was April 2022.   Clinical Laboratory Tests Ordered, included mildly elevated troponin similar to prior values.  BNP slightly elevated into the 600 range.  Creatinine of 1.69 but no electrolyte disturbance.  No anemia.  Radiologic Tests Ordered, included CXR. I independently interpreted the images and agree with radiology interpretation.   Cardiac Monitor Tracing which shows NSR.   Social Determinants of Health Risk patient is a non-smoker.   Consult complete with Hospitalist.   Discussed patient's case with [facility] to request admission. Patient and family (if present) updated with plan. Care transferred to [facility] service.   Medical Decision Making: Summary:  Patient presents emergency room for evaluation of shortness of breath, mainly with exertion.  Recently diagnosed with amyloidosis, likely contributing to today's presentation.  He is not in distress but does have a new oxygen requirement and reports feeling much better on O2.  No severe swelling in the legs.  He did take his as needed Lasix today.  Plan to give additional Lasix his BNP is elevated.  Low suspicion for PE given his compliance with Xarelto and lack of other exam findings or vital sign changes to suspect this.  May benefit from diuresis and cardiac evaluation in the morning.  Reevaluation with update and discussion with patient. He is in agreement with plan for admit.   Disposition:  admit  ____________________________________________  FINAL CLINICAL IMPRESSION(S) / ED DIAGNOSES  Final diagnoses:  Acute respiratory failure with hypoxia (HCC)  Precordial chest pain     Note:  This document was prepared using Dragon voice recognition software and may include unintentional dictation errors.  Nanda Quinton, MD, [facility] Emergency Medicine    Ellyse Rotolo, [facility], MD 12/17/21 [phone]

## 2021-12-16 NOTE — ED Triage Notes (Signed)
Pt reported to ED with c/o ShOB x4 days that has gradually worsened. Pt states he "struggled after he woke up from nap to breathe". Endorses lightheadedness, denies any CP or n/v.

## 2021-12-17 ENCOUNTER — Inpatient Hospital Stay (HOSPITAL_COMMUNITY): Payer: Medicare HMO

## 2021-12-17 DIAGNOSIS — Z87891 Personal history of nicotine dependence: Secondary | ICD-10-CM | POA: Diagnosis not present

## 2021-12-17 DIAGNOSIS — G4737 Central sleep apnea in conditions classified elsewhere: Secondary | ICD-10-CM

## 2021-12-17 DIAGNOSIS — J9601 Acute respiratory failure with hypoxia: Secondary | ICD-10-CM

## 2021-12-17 DIAGNOSIS — I43 Cardiomyopathy in diseases classified elsewhere: Secondary | ICD-10-CM | POA: Diagnosis not present

## 2021-12-17 DIAGNOSIS — E859 Amyloidosis, unspecified: Secondary | ICD-10-CM

## 2021-12-17 DIAGNOSIS — I480 Paroxysmal atrial fibrillation: Secondary | ICD-10-CM

## 2021-12-17 DIAGNOSIS — I4510 Unspecified right bundle-branch block: Secondary | ICD-10-CM | POA: Diagnosis present

## 2021-12-17 DIAGNOSIS — I440 Atrioventricular block, first degree: Secondary | ICD-10-CM | POA: Diagnosis present

## 2021-12-17 DIAGNOSIS — K219 Gastro-esophageal reflux disease without esophagitis: Secondary | ICD-10-CM | POA: Diagnosis present

## 2021-12-17 DIAGNOSIS — Z8249 Family history of ischemic heart disease and other diseases of the circulatory system: Secondary | ICD-10-CM | POA: Diagnosis not present

## 2021-12-17 DIAGNOSIS — E872 Acidosis, unspecified: Secondary | ICD-10-CM | POA: Diagnosis present

## 2021-12-17 DIAGNOSIS — I5021 Acute systolic (congestive) heart failure: Secondary | ICD-10-CM

## 2021-12-17 DIAGNOSIS — E854 Organ-limited amyloidosis: Secondary | ICD-10-CM | POA: Diagnosis present

## 2021-12-17 DIAGNOSIS — Z953 Presence of xenogenic heart valve: Secondary | ICD-10-CM | POA: Diagnosis not present

## 2021-12-17 DIAGNOSIS — I5041 Acute combined systolic (congestive) and diastolic (congestive) heart failure: Secondary | ICD-10-CM | POA: Diagnosis not present

## 2021-12-17 DIAGNOSIS — N1832 Chronic kidney disease, stage 3b: Secondary | ICD-10-CM | POA: Diagnosis present

## 2021-12-17 DIAGNOSIS — I509 Heart failure, unspecified: Secondary | ICD-10-CM

## 2021-12-17 DIAGNOSIS — Z20822 Contact with and (suspected) exposure to covid-19: Secondary | ICD-10-CM | POA: Diagnosis present

## 2021-12-17 DIAGNOSIS — Z7901 Long term (current) use of anticoagulants: Secondary | ICD-10-CM | POA: Diagnosis not present

## 2021-12-17 DIAGNOSIS — E1142 Type 2 diabetes mellitus with diabetic polyneuropathy: Secondary | ICD-10-CM | POA: Diagnosis present

## 2021-12-17 DIAGNOSIS — G4733 Obstructive sleep apnea (adult) (pediatric): Secondary | ICD-10-CM | POA: Diagnosis present

## 2021-12-17 DIAGNOSIS — R072 Precordial pain: Secondary | ICD-10-CM | POA: Diagnosis not present

## 2021-12-17 DIAGNOSIS — Z66 Do not resuscitate: Secondary | ICD-10-CM | POA: Diagnosis present

## 2021-12-17 DIAGNOSIS — I5031 Acute diastolic (congestive) heart failure: Secondary | ICD-10-CM | POA: Diagnosis not present

## 2021-12-17 DIAGNOSIS — E871 Hypo-osmolality and hyponatremia: Secondary | ICD-10-CM | POA: Diagnosis present

## 2021-12-17 DIAGNOSIS — M109 Gout, unspecified: Secondary | ICD-10-CM | POA: Diagnosis present

## 2021-12-17 DIAGNOSIS — Z833 Family history of diabetes mellitus: Secondary | ICD-10-CM | POA: Diagnosis not present

## 2021-12-17 DIAGNOSIS — I5043 Acute on chronic combined systolic (congestive) and diastolic (congestive) heart failure: Secondary | ICD-10-CM | POA: Diagnosis present

## 2021-12-17 DIAGNOSIS — R0602 Shortness of breath: Secondary | ICD-10-CM | POA: Diagnosis present

## 2021-12-17 DIAGNOSIS — I130 Hypertensive heart and chronic kidney disease with heart failure and stage 1 through stage 4 chronic kidney disease, or unspecified chronic kidney disease: Secondary | ICD-10-CM | POA: Diagnosis present

## 2021-12-17 DIAGNOSIS — I2510 Atherosclerotic heart disease of native coronary artery without angina pectoris: Secondary | ICD-10-CM | POA: Diagnosis present

## 2021-12-17 DIAGNOSIS — J9811 Atelectasis: Secondary | ICD-10-CM | POA: Diagnosis present

## 2021-12-17 DIAGNOSIS — E785 Hyperlipidemia, unspecified: Secondary | ICD-10-CM | POA: Diagnosis present

## 2021-12-17 LAB — GLUCOSE, CAPILLARY: Glucose-Capillary: 121 mg/dL — ABNORMAL HIGH (ref 70–99)

## 2021-12-17 LAB — TROPONIN I (HIGH SENSITIVITY): Troponin I (High Sensitivity): 86 ng/L — ABNORMAL HIGH (ref <18)

## 2021-12-17 MED ORDER — SPIRONOLACTONE 12.5 MG HALF TABLET
12.5000 mg | ORAL_TABLET | Freq: Every day | ORAL | Status: DC
  Administered 2021-12-17 – 2021-12-20 (×4): 12.5 mg via ORAL
  Filled 2021-12-17 (×4): qty 1

## 2021-12-17 MED ORDER — ONDANSETRON HCL 4 MG PO TABS: 4.0000 mg | ORAL_TABLET | Freq: Four times a day (QID) | ORAL | Status: DC | PRN

## 2021-12-17 MED ORDER — ACETAMINOPHEN 650 MG RE SUPP: 650.0000 mg | Freq: Four times a day (QID) | RECTAL | Status: DC | PRN

## 2021-12-17 MED ORDER — TAFAMIDIS 61 MG PO CAPS: 1.0000 | ORAL_CAPSULE | Freq: Every day | ORAL | Status: DC

## 2021-12-17 MED ORDER — MIDODRINE HCL 5 MG PO TABS
5.0000 mg | ORAL_TABLET | Freq: Three times a day (TID) | ORAL | Status: DC
  Administered 2021-12-17 – 2021-12-20 (×11): 5 mg via ORAL
  Filled 2021-12-17 (×11): qty 1

## 2021-12-17 MED ORDER — LORATADINE 10 MG PO TABS: 10.0000 mg | ORAL_TABLET | Freq: Every day | ORAL | Status: DC | PRN

## 2021-12-17 MED ORDER — RIVAROXABAN 20 MG PO TABS
20.0000 mg | ORAL_TABLET | Freq: Every evening | ORAL | Status: DC
  Administered 2021-12-17 – 2021-12-19 (×3): 20 mg via ORAL
  Filled 2021-12-17 (×3): qty 1

## 2021-12-17 MED ORDER — TRAZODONE HCL 50 MG PO TABS: 25.0000 mg | ORAL_TABLET | Freq: Every evening | ORAL | Status: DC | PRN

## 2021-12-17 MED ORDER — ALLOPURINOL 300 MG PO TABS
300.0000 mg | ORAL_TABLET | Freq: Every day | ORAL | Status: DC
  Administered 2021-12-17 – 2021-12-20 (×4): 300 mg via ORAL
  Filled 2021-12-17 (×3): qty 1
  Filled 2021-12-17: qty 3

## 2021-12-17 MED ORDER — ENSURE ENLIVE PO LIQD
237.0000 mL | Freq: Two times a day (BID) | ORAL | Status: DC
  Administered 2021-12-18: 237 mL via ORAL

## 2021-12-17 MED ORDER — FUROSEMIDE 10 MG/ML IJ SOLN
20.0000 mg | Freq: Two times a day (BID) | INTRAMUSCULAR | Status: DC
  Administered 2021-12-17 (×2): 20 mg via INTRAVENOUS
  Filled 2021-12-17 (×2): qty 2

## 2021-12-17 MED ORDER — ROSUVASTATIN CALCIUM 5 MG PO TABS
5.0000 mg | ORAL_TABLET | Freq: Every evening | ORAL | Status: DC
  Administered 2021-12-17 – 2021-12-19 (×3): 5 mg via ORAL
  Filled 2021-12-17 (×3): qty 1

## 2021-12-17 MED ORDER — EMPAGLIFLOZIN 10 MG PO TABS
10.0000 mg | ORAL_TABLET | Freq: Every day | ORAL | Status: DC
  Administered 2021-12-17 – 2021-12-20 (×4): 10 mg via ORAL
  Filled 2021-12-17 (×4): qty 1

## 2021-12-17 MED ORDER — ACETAMINOPHEN 325 MG PO TABS: 650.0000 mg | ORAL_TABLET | Freq: Four times a day (QID) | ORAL | Status: DC | PRN

## 2021-12-17 MED ORDER — MAGNESIUM OXIDE -MG SUPPLEMENT 400 (240 MG) MG PO TABS
400.0000 mg | ORAL_TABLET | Freq: Every day | ORAL | Status: DC
  Administered 2021-12-17 – 2021-12-20 (×4): 400 mg via ORAL
  Filled 2021-12-17 (×4): qty 1

## 2021-12-17 MED ORDER — ONDANSETRON HCL 4 MG/2ML IJ SOLN: 4.0000 mg | Freq: Four times a day (QID) | INTRAMUSCULAR | Status: DC | PRN

## 2021-12-17 MED ORDER — MAGNESIUM HYDROXIDE 400 MG/5ML PO SUSP
30.0000 mL | Freq: Every day | ORAL | Status: DC | PRN
  Filled 2021-12-17: qty 30

## 2021-12-17 MED ORDER — COLCHICINE 0.6 MG PO TABS
0.6000 mg | ORAL_TABLET | Freq: Every day | ORAL | Status: DC | PRN
  Filled 2021-12-17: qty 1

## 2021-12-17 NOTE — Progress Notes (Signed)
°  Echocardiogram 2D Echocardiogram has been performed.  Darlina Sicilian M 12/17/2021, 10:03 AM

## 2021-12-17 NOTE — Consult Note (Addendum)
Advanced Heart Failure Team Consult Note   Primary Physician: [name], MD PCP-Cardiologist:  Larae Grooms, MD  Reason for Consultation: Heart Failure  HPI:    Frank Berg is seen today for evaluation of heart failure  at the request of Dr Sidney Ace.   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 CAD s/p previous stent (2008) followed by LIMA to LAD at time of AVR.  Also has chronic HFmEF and TTR amyloidosis.  R/L cath in 5/22: mLAD 90% (patent LIMA to LAD), LCX and RCA minimal CAD.  RA 9 RV 24/0 PA 25/8 (15) PCWP 5 Fick 3.4/1.6  cMRI 09/22/21 1. Mild LVE with moderate LVH diffuse hypokinesis EF 40% 2. Markedly abnormal post gadolinium images with 41.8% myocardial uptake over all axial slices Although there is a subendocardial and mid myocardial predominance and worse uptake in the basal segments there is also transmural uptake in some areas not contained to a coronary distribution 3. Degenerative appearing Bioprosthetic AVR with mild appearing AR and thickened leaflets 4.  Mild RVE/RV hypokinesis RVEF 46%   PYP Scan 11/03/20 Ratio 2.8 Markedly positive for TTR .  Followed in the HF clinic and was last seen 11/04/21. Tafamidis ordered. He was able to start Tafamidis the end of January.  He had been taking lasix as needed.  Increased shortness of breath/fatigue over the last few week. He took 20 mg lasix Tues and Thur. Says he really did not have much urine output. Felt worse and went to the ED.     Presented to the ED with chest pain. CXR- no pulmonary edema. Hgb 14, HS Trop 86> BNP 633 , creatinine 1.7. In the ED given IV lasix. Brisk diuresis noted.   Review of Systems: [y] = yes, _0  = no   General: Weight gain _1 ; Weight loss _2 ; Anorexia _3 ; Fatigue [Y ]; Fever _4 ; Chills _5 ; Weakness [ Y]  Cardiac: Chest pain/pressure _6 ; Resting SOB _7 ;  Exertional SOB [Y ]; Orthopnea _8 ; Pedal Edema _9 ; Palpitations _10 ; Syncope _11 ; Presyncope _12 ; Paroxysmal nocturnal dyspnea_13   Pulmonary: Cough _14 ; Wheezing_15 ; Hemoptysis_16 ; Sputum _17 ; Snoring _18   GI: Vomiting_19 ; Dysphagia_20 ; Melena_21 ; Hematochezia _22 ; Heartburn_23 ; Abdominal pain _24 ; Constipation _25 ; Diarrhea _26 ; BRBPR _27   GU: Hematuria_28 ; Dysuria _29 ; Nocturia_30   Vascular: Pain in legs with walking _31 ; Pain in feet with lying flat _32 ; Non-healing sores _33 ; Stroke _34 ; TIA _35 ; Slurred speech _36 ;  Neuro: Headaches_37 ; Vertigo_38 ; Seizures_39 ; Paresthesias_40 ;Blurred vision _41 ; Diplopia _42 ; Vision changes _43   Ortho/Skin: Arthritis _44 ; Joint pain [ Y]; Muscle pain _45 ; Joint swelling _46 ; Back Pain _47 ; Rash _48   Psych: Depression_49 ; Anxiety_50   Heme: Bleeding problems _51 ; Clotting disorders _52 ; Anemia _53   Endocrine: Diabetes [Y ]; Thyroid dysfunction_54   Home Medications Prior to Admission medications   Medication Sig Start Date End Date Taking? Authorizing Provider  allopurinol (ZYLOPRIM) 300 MG tablet Take 300 mg by mouth daily. 11/23/19  Yes [provider]  azithromycin (ZITHROMAX) 500 MG tablet Take 500 mg by mouth once. **took one for dentist appointment**  Yes [provider]  clotrimazole-betamethasone (LOTRISONE) lotion Apply 1 application topically as needed (dry skin).   Yes [provider]  colchicine 0.6 MG tablet Take 0.6 mg by mouth as needed (gout).   Yes [provider]  empagliflozin (JARDIANCE) 10 MG TABS tablet Take 1 tablet (10 mg total) by mouth daily before breakfast. 10/06/21  Yes Jettie Booze, MD  furosemide (LASIX) 40 MG tablet Take 1 tablet (40 mg total) by mouth as needed for edema. 09/30/21  Yes Dat Derksen, Shaune Pascal, MD  loratadine (CLARITIN) 10 MG tablet Take 10 mg by mouth daily as needed for allergies.   Yes [provider]  magnesium oxide (MAG-OX) 400 MG tablet Take 400 mg by  mouth daily. 12/11/19  Yes [provider]  metFORMIN (GLUCOPHAGE) 500 MG tablet Take 1 tablet (500 mg total) by mouth 2 (two) times daily with a meal. 03/20/21  Yes Jettie Booze, MD  midodrine (PROAMATINE) 5 MG tablet Take 1 tablet (5 mg total) by mouth 3 (three) times daily with meals. 09/30/21  Yes Elton Catalano, Shaune Pascal, MD  rosuvastatin (CRESTOR) 5 MG tablet Take 5 mg by mouth every evening. 08/26/16  Yes [provider]  spironolactone (ALDACTONE) 25 MG tablet Take 0.5 tablets (12.5 mg total) by mouth daily. 08/23/21  Yes Cordero Surette, Shaune Pascal, MD  tadalafil, PAH, (ADCIRCA) 20 MG tablet Take 20 mg by mouth daily as needed (ED). 05/06/21  Yes [provider]  Tafamidis (VYNDAMAX) 61 MG CAPS Take 1 capsule by mouth daily. 11/09/21  Yes Dannell Gortney, Shaune Pascal, MD  XARELTO 20 MG TABS tablet Take 1 tablet (20 mg total) by mouth every evening. 03/19/21  Yes Jettie Booze, MD  glucose blood (ONETOUCH VERIO) test strip TEST ONCE A DAY DX E11.29    [provider]  triamcinolone (KENALOG) 0.025 % ointment Apply 1 application topically as needed. Patient not taking: Reported on 12/17/2021    [provider]    Past Medical History: Past Medical History:  Diagnosis Date   Atrial fibrillation ([place])    Coronary artery disease    Diabetes mellitus without complication ([place])    Diabetic polyneuropathy ([place])    GERD (gastroesophageal reflux disease)    Gout, unspecified    HLD (hyperlipidemia)    Hx of adenomatous polyp of colon 12/03/2020   Hypertension    OSA on CPAP    Sleep apnea     Past Surgical History: Past Surgical History:  Procedure Laterality Date   ABLATION     AORTIC VALVE REPLACEMENT  2011   CERVICAL DISC SURGERY     spinal stenosis   COLONOSCOPY     ESOPHAGOGASTRODUODENOSCOPY     RIGHT/LEFT HEART CATH AND CORONARY/GRAFT ANGIOGRAPHY N/A 03/18/2021   Procedure: RIGHT/LEFT HEART CATH AND CORONARY/GRAFT ANGIOGRAPHY;  Surgeon:  Jettie Booze, MD;  Location: [place] [facility];  Service: Cardiovascular;  Laterality: N/A;   TONSILLECTOMY      Family History: Family History  Problem Relation Age of Onset   Congenital heart disease Mother    Diabetes Mother    Hypertension Mother    Heart disease Mother    Heart attack Father    Cervical cancer Sister    Diabetes Sister    Hypertension Brother    Diabetes Brother    Pancreatic cancer Brother    Diabetes Brother    Breast cancer Sister    Diabetes Sister    Diabetes Sister    Diabetes Sister  Diabetes Sister    Diabetes Son    Colon cancer Neg Hx    Colon polyps Neg Hx    Esophageal cancer Neg Hx    Stomach cancer Neg Hx     Social History: Social History   Socioeconomic History   Marital status: Single    Spouse name: Not on file   Number of children: 2   Years of education: Not on file   Highest education level: Not on file  Occupational History   Occupation: retired  Tobacco Use   Smoking status: Former    Packs/day: 0.25    Years: 10.00    Pack years: 2.50    Types: Cigarettes    Quit date: 11/03/1981    Years since quitting: 40.1   Smokeless tobacco: Never  Vaping Use   Vaping Use: Never used  Substance and Sexual Activity   Alcohol use: Yes    Comment: occasional   Drug use: No   Sexual activity: Not on file  Other Topics Concern   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 to the [place] area 2017   Married 1 son 1 daughter   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 [employer] in [place] for 16 years   Other jobs including working at the [employer], providing health care for disabled, 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, 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   Social Determinants of Radio broadcast assistant Strain: Not on file  Food Insecurity: Not on file  Transportation Needs:  Not on file  Physical Activity: Not on file  Stress: Not on file  Social Connections: Not on file    Allergies:  Allergies  Allergen Reactions   Lipitor [Atorvastatin] Other (See Comments)    Body aches   Penicillins Swelling    Swollen face   Penicillin G     Other reaction(s): Unknown    Objective:    Vital Signs:   Temp:  [98.6 F (37 C)] 98.6 F (37 C) (02/23 2013) Pulse Rate:  [50-114] 66 (02/24 0930) Resp:  [0-27] 16 (02/24 0930) BP: (99-114)/(71-89) 113/76 (02/24 0930) SpO2:  [95 %-100 %] 100 % (02/24 0930) Weight:  [83.9 kg] 83.9 kg (02/23 2005)    Weight change: Filed Weights   12/16/21 2005  Weight: 83.9 kg    Intake/Output:   Intake/Output Summary (Last 24 hours) at 12/17/2021 1014 Last data filed at 12/17/2021 0744 Gross per 24 hour  Intake --  Output 1600 ml  Net -1600 ml      Physical Exam   Reds 35%.  General:  No resp difficulty HEENT: normal Neck: supple. JVP 8-9  . Carotids 2+ bilat; no bruits. No lymphadenopathy or thyromegaly appreciated. Cor: PMI nondisplaced. Regular rate & rhythm. No rubs, gallops or murmurs. Lungs: clear Abdomen: soft, nontender, nondistended. No hepatosplenomegaly. No bruits or masses. Good bowel sounds. Extremities: no cyanosis, clubbing, rash, edema Neuro: alert & orientedx3, cranial nerves grossly intact. moves all 4 extremities w/o difficulty. Affect pleasant   Telemetry   SR 50-60s   EKG    SR 59 bpm 1st degree heart block   Labs   Basic Metabolic Panel: Recent Labs  Lab 12/16/21 2022  NA 136  K 4.5  CL 103  CO2 22  GLUCOSE 119*  BUN 34*  CREATININE 1.69*  CALCIUM 9.5    Liver Function Tests: No results for input(s): AST, ALT, ALKPHOS, BILITOT, PROT, ALBUMIN in the last 168 hours. No results for input(s): LIPASE, AMYLASE in the last 168 hours. No results for input(s): AMMONIA in the last 168 hours.  CBC: Recent Labs  Lab 12/16/21 2022  WBC 4.8  NEUTROABS 2.5  HGB 14.3  HCT 44.1   MCV 87.8  PLT 288    Cardiac Enzymes: No results for input(s): CKTOTAL, CKMB, CKMBINDEX, TROPONINI in the last 168 hours.  BNP: BNP (last 3 results) Recent Labs    09/27/21 0115 11/04/21 1506 12/16/21 2023  BNP 431.9* 337.1* 633.8*    ProBNP (last 3 results) Recent Labs    02/09/21 1203 05/25/21 1345 07/06/21 0942  PROBNP 792* 1,236* 2,052*     CBG: No results for input(s): GLUCAP in the last 168 hours.  Coagulation Studies: No results for input(s): LABPROT, INR in the last 72 hours.   Imaging   DG Chest 2 View  Result Date: 12/16/2021 CLINICAL DATA:  Shortness of breath EXAM: CHEST - 2 VIEW COMPARISON:  Previous studies including the examination of 09/27/2021 FINDINGS: Transverse diameter of heart is increased. There is previous prosthetic aortic valve placement. There is previous surgical fusion in the lower cervical spine. There are no signs of pulmonary edema or focal pulmonary consolidation. Blunting of right lateral CP angle and pleural thickening in the lateral aspect of right lower lung fields have not changed significantly. Left lateral CP angle is clear. There is no pneumothorax. IMPRESSION: Cardiomegaly. There are no signs of pulmonary edema or new focal infiltrates. Other findings as described in the body of the report. Electronically Signed   By: Elmer Picker M.D.   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 CAD s/p  previous stent (2008) followed by LIMA to LAD at time of AVR.  Also has chronic HFmEF and TTR amyloidosis.  Admitted with A/C HFmEF.   Assessment/Plan  1. A/C HFmEF  Echo 01/2021-  EF of 45% and grade 2 diastolic dysfunction with mild to moderate left atrial dilatation and moderate right atrial dilatation with mild mitral regurgitation.  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 - Echo repeated today  PYP --> TTR Amyloid on tafamidis.  - Volume overload on admit. BNP 633. He has been given total of 60 mg IV lasix.  Brisk diuresis noted.   - Reds Clip 35%. Agree with diuresis. Suspect he can switch to oral regimen tomorrow. He will need to be on scheduled diuretics at discharge.  - Continue tafamidis.  - Continue midodrine 5 mg tid.  - No room for BB with bradycardia.  - Continue spiro 12.5 mg daily  -  continue jardiance 10 mg daily   2. PAF -  in SR today  -On xarelto  3. OSA Continue CPAP nightly   4. CKD Stage IIIb Creatinine baseline 1.7-2 Creatinine 1.7 today.   5. DMII On SSI  Length of Stay: 0  Amy Clegg, NP  12/17/2021, 10:14 AM  Advanced Heart Failure Team Pager [phone] (M-F; 7a - 5p)  Please contact [facility] for night-coverage after hours (4p -7a ) and weekends on [email or web address]   Patient seen and examined with the above-signed Advanced Practice Provider and/or Housestaff. I personally reviewed laboratory data, imaging studies and relevant notes. I independently examined the patient and formulated the important aspects of the plan. I have edited the note to reflect any of my changes or salient points. I have personally discussed the plan with the patient and/or family.  66 y/o male with TTR cardiac amyloidosis, PAF. CKD 3b admitted with several weeks of progressive HF symptoms. BNP 633.   Has received several doses of IV lasix with good response. Breathing better but still not back to baseline.  ReDS lung water 35% (normal 20-35%)  Echo today EF 55%  severe LVH. RV not imaged well but at leas moderate RV dysfunction.  General:  Weak appearing. No resp difficulty HEENT: normal Neck: supple. JVP to jaw  Carotids 2+ bilat; no bruits. No lymphadenopathy or thryomegaly appreciated. Cor: PMI nondisplaced. Brady regular 2/6 TR Lungs: clear Abdomen: soft, nontender, + distended. No hepatosplenomegaly. No bruits or masses. Good bowel sounds. Extremities: no cyanosis, clubbing, rash, tr edema Neuro: alert & orientedx3, cranial nerves grossly intact. moves all 4 extremities w/o difficulty. Affect pleasant  He remains volume overloaded. Seems to have R> L HF. Continue IV diuresis. Continue tafamadis. We will follow.   Glori Bickers, MD  3:38 PM

## 2021-12-17 NOTE — TOC Progression Note (Signed)
Transition of Care [facility]) - Progression Note    Patient Details  Name: FARRON WATROUS MRN: [id number] Date of Birth: [birth year]  Transition of Care [facility]) CM/SW Contact  Zenon Mayo, RN Phone Number: 12/17/2021, 4:47 PM  Clinical Narrative:     Transition of Care [facility]) Screening Note   Patient Details  Name: GODWIN TEDESCO Date of Birth: [birth year]   Transition of Care [facility]) CM/SW Contact:    Zenon Mayo, RN Phone Number: 12/17/2021, 4:47 PM    Transition of Care Department [facility]) has reviewed patient and no TOC needs have been identified at this time. We will continue to monitor patient advancement through interdisciplinary progression rounds. If new patient transition needs arise, please place a TOC consult.          Expected Discharge Plan and Services                                                 Social Determinants of Health (SDOH) Interventions    Readmission Risk Interventions No flowsheet data found.

## 2021-12-17 NOTE — Progress Notes (Signed)
Care started prior to midnight in the emergency room patient was admitted early this morning after midnight by Dr. Eugenie Norrie I am in current agreement with his assessment and plan.  Additional changes to plan of care been made accordingly.  Patient is a 66 year old thin African-American male with a past medical history significant for but not limited to atrial fibrillation on anticoagulant with Xarelto, CAD, type 2 diabetes mellitus with peripheral neuropathy, GERD, gout, dyslipidemia, hypertension, obstructive sleep apnea on CPAP, history of chronic systolic and diastolic CHF, history of transthyretin amyloidosis as well as other comorbidities who presents with cute onset of intermittent dyspnea in the last few weeks which is progressively gotten worse with coughing and wheezing.  He was admitted and expressed that he had orthopnea sleeping and had to sleep on an incline but does not have any extremity edema.  He also has PND and denies any nausea or vomiting.  He came to the ED and admitted to having some constipation.  While in the ED his heart respiratory rate and had a pulse oximetry of 100% and had to be placed on 2 L supplemental oxygen.  BNP was elevated at 633.8 higher than his prior.  High-sensitivity troponin was 86 and CBC was within normal limits.  EKG showed showed sinus bradycardia with first-degree AV block.  Chest x-ray showed cardiomegaly with no signs of pulm edema or infiltrates.  In the ED he was given IV Lasix and cardiology was consulted for further evaluation recommendations and he had an echocardiogram repeated.  Cardiology is recommending diuresis and continuing spironolactone and Jardiance.  He was given IV Lasix 60 mg and his Reds clip was 35%.  Cardiology planning on switching to oral diuresis in the morning and patient is in sinus rhythm today but is bradycardic.  Continuing anticoagulation with Xarelto.  Patient will need an ambulatory home O2 screen prior to discharge.  Currently he  is being admitted and treated for the following but limited to:  Acute on chronic systolic and diastolic CHF with subsequent acute hypoxic respiratory failure.   -The patient had a 2D echo on 02/12/2021 revealing an EF of 45% and grade 2 diastolic dysfunction with mild to moderate left atrial dilatation and moderate right atrial dilatation with mild mitral regurgitation.  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 - The patient will be admitted to a cardiac telemetry bed. -BNP was elevated at 633.8 - Repeat ECHO this visit - We will continue diuresis with IV Lasix given borderline blood pressure. Card's Managing  - We will monitor I's and O's and daily weights. - We will continue Jardiance and Aldactone. - We will continue Xarelto.  TTR Amyloidosis. - We will resume tafamidis.  Paroxysmal atrial fibrillation. -Currently in SNR -We will continue Xarelto.  Dyslipidemia. -We will continue statin therapy.  Type 2 diabetes mellitus. - The patient will be placed on supplement coverage with NovoLog and will hold off metformin.  Obstructive sleep apnea. - We will resume his CPAP nightly.  CKD Stage 3b -Patient's BUN/Cr is now 34/1.69 -Continue with diuresis as above and will avoid further nephrotoxic medications, contrast dyes, hypotension and renally adjust medications -Repeat CMP in the AM   Continue monitor patient's clinical response to intervention and repeat blood work and imaging in the a.m and follow-up on cardiology recommendations.

## 2021-12-17 NOTE — Progress Notes (Signed)
REDS VEST / Clip  READING= 35%   Device FITTING TASKS: Clip Station = C Ruler Arnold.D. CPP, BCPS Clinical Pharmacist [phone] 12/17/2021 11:02 AM

## 2021-12-17 NOTE — H&P (Signed)
[place]   PATIENT NAME: Frank Berg    MR#:  [id number]  DATE OF BIRTH:  [birth year]  DATE OF ADMISSION:  12/16/2021  PRIMARY CARE PHYSICIAN: [name], MD   Patient is coming from: Home  REQUESTING/REFERRING PHYSICIAN: Nanda Quinton, MD  CHIEF COMPLAINT:  No chief complaint on file.   HISTORY OF PRESENT ILLNESS:  Frank Berg is a 66 y.o. African-American male with medical history significant for atrial fibrillation on Xarelto, coronary artery disease, type 2 diabetes mellitus with peripheral neuropathy, GERD, gout, dyslipidemia, hypertension and obstructive sleep apnea on CPAP, who presented to the emergency room with the onset of intermittent dyspnea over the last few weeks with associated occasional cough productive of clear sputum as well as wheezing.  He admitted to dyspnea on exertion as well as orthopnea sleeping on incline in bed without lower extremity edema.  He admitted to paroxysmal nocturnal dyspnea.  He denies any chest pain or palpitations.  No nausea or vomiting or abdominal pain.  He admitted to constipation.  No fever or chills.  No dysuria, oliguria or hematuria or flank pain.  ED Course: When he came to the ER heart rate was 59 and pulse oximetry of 100% on 2 L of O2 by nasal cannula with otherwise normal vital signs.  Labs revealed a BUN of 34 and creatinine 1.69 up from 1.36 on 11/04/2021.  BNP was 633.8 compared to 337.1 then.  High-sensitivity troponin I was 86 CBC was within normal. EKG as reviewed by me : EKG showed sinus bradycardia with a rate of 59 with first-degree AV block with PACs and right bundle branch block with T wave inversion in V1 through V3 and Q waves inferiorly. Imaging: Chest x-ray showed cardiomegaly with no signs of pulmonary edema or new focal infiltrates.  The patient was given 40 mg of IV Lasix.  He will be admitted to a cardiac telemetry bed for further evaluation and management. PAST MEDICAL HISTORY:   Past Medical  History:  Diagnosis Date   Atrial fibrillation ([place])    Coronary artery disease    Diabetes mellitus without complication (HCC)    Diabetic polyneuropathy (HCC)    GERD (gastroesophageal reflux disease)    Gout, unspecified    HLD (hyperlipidemia)    Hx of adenomatous polyp of colon 12/03/2020   Hypertension    OSA on CPAP    Sleep apnea     PAST SURGICAL HISTORY:   Past Surgical History:  Procedure Laterality Date   ABLATION     AORTIC VALVE REPLACEMENT  2011   CERVICAL DISC SURGERY     spinal stenosis   COLONOSCOPY     ESOPHAGOGASTRODUODENOSCOPY     RIGHT/LEFT HEART CATH AND CORONARY/GRAFT ANGIOGRAPHY N/A 03/18/2021   Procedure: RIGHT/LEFT HEART CATH AND CORONARY/GRAFT ANGIOGRAPHY;  Surgeon: Jettie Booze, MD;  Location: [facility];  Service: Cardiovascular;  Laterality: N/A;   TONSILLECTOMY      SOCIAL HISTORY:   Social History   Tobacco Use   Smoking status: Former    Packs/day: 0.25    Years: 10.00    Pack years: 2.50    Types: Cigarettes    Quit date: 11/03/1981    Years since quitting: 40.1   Smokeless tobacco: Never  Substance Use Topics   Alcohol use: Yes    Comment: occasional    FAMILY HISTORY:   Family History  Problem Relation Age of Onset   Congenital heart disease Mother  Diabetes Mother    Hypertension Mother    Heart disease Mother    Heart attack Father    Cervical cancer Sister    Diabetes Sister    Hypertension Brother    Diabetes Brother    Pancreatic cancer Brother    Diabetes Brother    Breast cancer Sister    Diabetes Sister    Diabetes Sister    Diabetes Sister    Diabetes Sister    Diabetes Son    Colon cancer Neg Hx    Colon polyps Neg Hx    Esophageal cancer Neg Hx    Stomach cancer Neg Hx     DRUG ALLERGIES:   Allergies  Allergen Reactions   Lipitor [Atorvastatin] Other (See Comments)    Body aches   Penicillins Swelling    Swollen face   Penicillin G     Other reaction(s): Unknown     REVIEW OF SYSTEMS:   ROS As per history of present illness. All pertinent systems were reviewed above. Constitutional, HEENT, cardiovascular, respiratory, GI, GU, musculoskeletal, neuro, psychiatric, endocrine, integumentary and hematologic systems were reviewed and are otherwise negative/unremarkable except for positive findings mentioned above in the HPI.   MEDICATIONS AT HOME:   Prior to Admission medications   Medication Sig Start Date End Date Taking? Authorizing Provider  allopurinol (ZYLOPRIM) 300 MG tablet Take 300 mg by mouth daily. 11/23/19  Yes [provider]  azithromycin (ZITHROMAX) 500 MG tablet Take 500 mg by mouth once. **took one for dentist appointment**   Yes [provider]  clotrimazole-betamethasone (LOTRISONE) lotion Apply 1 application topically as needed (dry skin).   Yes [provider]  colchicine 0.6 MG tablet Take 0.6 mg by mouth as needed (gout).   Yes [provider]  empagliflozin (JARDIANCE) 10 MG TABS tablet Take 1 tablet (10 mg total) by mouth daily before breakfast. 10/06/21  Yes Jettie Booze, MD  furosemide (LASIX) 40 MG tablet Take 1 tablet (40 mg total) by mouth as needed for edema. 09/30/21  Yes Bensimhon, Shaune Pascal, MD  loratadine (CLARITIN) 10 MG tablet Take 10 mg by mouth daily as needed for allergies.   Yes [provider]  magnesium oxide (MAG-OX) 400 MG tablet Take 400 mg by mouth daily. 12/11/19  Yes [provider]  metFORMIN (GLUCOPHAGE) 500 MG tablet Take 1 tablet (500 mg total) by mouth 2 (two) times daily with a meal. 03/20/21  Yes Jettie Booze, MD  midodrine (PROAMATINE) 5 MG tablet Take 1 tablet (5 mg total) by mouth 3 (three) times daily with meals. 09/30/21  Yes Bensimhon, Shaune Pascal, MD  rosuvastatin (CRESTOR) 5 MG tablet Take 5 mg by mouth every evening. 08/26/16  Yes [provider]  spironolactone (ALDACTONE) 25 MG tablet Take 0.5 tablets (12.5 mg total) by  mouth daily. 08/23/21  Yes Bensimhon, Shaune Pascal, MD  tadalafil, PAH, (ADCIRCA) 20 MG tablet Take 20 mg by mouth daily as needed (ED). 05/06/21  Yes [provider]  Tafamidis (VYNDAMAX) 61 MG CAPS Take 1 capsule by mouth daily. 11/09/21  Yes Bensimhon, Shaune Pascal, MD  XARELTO 20 MG TABS tablet Take 1 tablet (20 mg total) by mouth every evening. 03/19/21  Yes Jettie Booze, MD  glucose blood (ONETOUCH VERIO) test strip TEST ONCE A DAY DX E11.29    [provider]  triamcinolone (KENALOG) 0.025 % ointment Apply 1 application topically as needed. Patient not taking: Reported on 12/17/2021    [provider]  VITAL SIGNS:  Blood pressure 99/75, pulse (!) 50, temperature 98.6 F (37 C), resp. rate (!) 8, height 6\' 2"  (1.88 m), weight 83.9 kg, SpO2 100 %.  PHYSICAL EXAMINATION:  Physical Exam  GENERAL:  66 y.o.-year-old African-American male patient lying in the bed with minimal respiratory distress with conversational dyspnea.  EYES: Pupils equal, round, reactive to light and accommodation. No scleral icterus. Extraocular muscles intact.  HEENT: Head atraumatic, normocephalic. Oropharynx and nasopharynx clear.  NECK:  Supple, no jugular venous distention. No thyroid enlargement, no tenderness.  LUNGS: Normal breath sounds bilaterally, no wheezing, rales,rhonchi or crepitation. No use of accessory muscles of respiration.  CARDIOVASCULAR: Diminished bibasilar breath sounds with bibasilar rales.  No murmurs, rubs, or gallops.  ABDOMEN: Soft, nondistended, nontender. Bowel sounds present. No organomegaly or mass.  EXTREMITIES: No pedal edema, cyanosis, or clubbing.  NEUROLOGIC: Cranial nerves II through XII are intact. Muscle strength 5/5 in all extremities. Sensation intact. Gait not checked.  PSYCHIATRIC: The patient is alert and oriented x 3.  Normal affect and good eye contact. SKIN: No obvious rash, lesion, or ulcer.   LABORATORY PANEL:   CBC Recent Labs   Lab 12/16/21 2022  WBC 4.8  HGB 14.3  HCT 44.1  PLT 288   ------------------------------------------------------------------------------------------------------------------  Chemistries  Recent Labs  Lab 12/16/21 2022  NA 136  K 4.5  CL 103  CO2 22  GLUCOSE 119*  BUN 34*  CREATININE 1.69*  CALCIUM 9.5   ------------------------------------------------------------------------------------------------------------------  Cardiac Enzymes No results for input(s): TROPONINI in the last 168 hours. ------------------------------------------------------------------------------------------------------------------  RADIOLOGY:  DG Chest 2 View  Result Date: 12/16/2021 CLINICAL DATA:  Shortness of breath EXAM: CHEST - 2 VIEW COMPARISON:  Previous studies including the examination of 09/27/2021 FINDINGS: Transverse diameter of heart is increased. There is previous prosthetic aortic valve placement. There is previous surgical fusion in the lower cervical spine. There are no signs of pulmonary edema or focal pulmonary consolidation. Blunting of right lateral CP angle and pleural thickening in the lateral aspect of right lower lung fields have not changed significantly. Left lateral CP angle is clear. There is no pneumothorax. IMPRESSION: Cardiomegaly. There are no signs of pulmonary edema or new focal infiltrates. Other findings as described in the body of the report. Electronically Signed   By: Elmer Picker M.D.   On: 12/16/2021 20:52      IMPRESSION AND PLAN:  Principal Problem:   Acute CHF (congestive heart failure) ([place])  1.  Acute on chronic systolic and diastolic CHF with subsequent acute hypoxic respiratory failure.  The patient had a 2D echo on 02/12/2021 revealing an EF of 45% and grade 2 diastolic dysfunction with mild to moderate left atrial dilatation and moderate right atrial dilatation with mild mitral regurgitation.  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 -  The patient will be admitted to a cardiac telemetry bed. - We will continue gentle diuresis with IV Lasix given borderline blood pressure. - We will monitor I's and O's and daily weights. - We will continue Jardiance and Aldactone. - We will continue Xarelto.  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  3.  Paroxysmal atrial fibrillation. - We will continue Xarelto.  4.  Dyslipidemia. - We will continue statin therapy.  5.  Type 2 diabetes mellitus. - The patient will be placed on supplement coverage with NovoLog and will hold off metformin.  6.  Obstructive sleep apnea. - We will resume his CPAP nightly.  DVT prophylaxis: Xarelto.  Advanced Care Planning:  Code Status: DNR.  This was discussed with him. Family Communication:  The plan of care was discussed in details with the patient (and family). I answered all questions. The patient agreed to proceed with the above mentioned plan. Further management will depend upon hospital course. Disposition Plan: Back to previous home environment Consults called: none.  All the records are reviewed and case discussed with ED provider.  Status is: Inpatient  At the time of the admission, it appears that the appropriate admission status for this patient is inpatient.  This is judged to be reasonable and necessary in order to provide the required intensity of service to ensure the patient's safety given the presenting symptoms, physical exam findings and initial radiographic and laboratory data in the context of comorbid conditions.  The patient requires inpatient status due to high intensity of service, high risk of further deterioration and high frequency of surveillance required.  I certify that at the time of admission, it is my clinical judgment that the patient will require inpatient hospital care extending more than 2 midnights.                            Dispo: The patient is from: Home              Anticipated d/c is to: Home               Patient currently is not medically stable to d/c.              Difficult to place patient: No  Christel Mormon M.D on 12/17/2021 at 5:36 AM  [facility]   From 7 PM-7 AM, contact night-coverage [email or web address]  CC: Primary care physician; [name], MD

## 2021-12-17 NOTE — ED Notes (Signed)
Patient ambulated with minimal assist to the bathroom

## 2021-12-17 NOTE — Progress Notes (Addendum)
Heart Failure Navigator Progress Note  Assessed for Heart & Vascular TOC clinic readiness.  Patient is already established with advanced CHF clinic - sees Dr. Haroldine Laws. Notified team of admission.  Navigator available for educational resources.   Kerby Nora, PharmD, BCPS Heart Failure Stewardship Pharmacist Phone [phone]

## 2021-12-18 ENCOUNTER — Inpatient Hospital Stay (HOSPITAL_COMMUNITY): Payer: Medicare HMO

## 2021-12-18 DIAGNOSIS — I5021 Acute systolic (congestive) heart failure: Secondary | ICD-10-CM

## 2021-12-18 LAB — CBC WITH DIFFERENTIAL/PLATELET
Abs Immature Granulocytes: 0.01 10*3/uL (ref 0.00–0.07)
Basophils Absolute: 0.1 10*3/uL (ref 0.0–0.1)
Basophils Relative: 1 %
Eosinophils Absolute: 0.2 10*3/uL (ref 0.0–0.5)
Eosinophils Relative: 4 %
HCT: 40 % (ref 39.0–52.0)
Hemoglobin: 13.8 g/dL (ref 13.0–17.0)
Immature Granulocytes: 0 %
Lymphocytes Relative: 40 %
Lymphs Abs: 2.1 10*3/uL (ref 0.7–4.0)
MCH: 28.9 pg (ref 26.0–34.0)
MCHC: 34.5 g/dL (ref 30.0–36.0)
MCV: 83.7 fL (ref 80.0–100.0)
Monocytes Absolute: 0.4 10*3/uL (ref 0.1–1.0)
Monocytes Relative: 7 %
Neutro Abs: 2.5 10*3/uL (ref 1.7–7.7)
Neutrophils Relative %: 48 %
Platelets: 255 10*3/uL (ref 150–400)
RBC: 4.78 MIL/uL (ref 4.22–5.81)
RDW: 15.6 % — ABNORMAL HIGH (ref 11.5–15.5)
WBC: 5.2 10*3/uL (ref 4.0–10.5)
nRBC: 0 % (ref 0.0–0.2)

## 2021-12-18 LAB — ECHOCARDIOGRAM COMPLETE
AR max vel: 2.05 cm2
AV Area VTI: 2.13 cm2
AV Area mean vel: 1.92 cm2
AV Mean grad: 10.8 mmHg
AV Peak grad: 19 mmHg
Ao pk vel: 2.18 m/s
Area-P 1/2: 3.11 cm2
Height: 74 in
S' Lateral: 3.6 cm
Weight: 2960 oz

## 2021-12-18 LAB — COMPREHENSIVE METABOLIC PANEL
ALT: 8 U/L (ref 0–44)
AST: 31 U/L (ref 15–41)
Albumin: 3.8 g/dL (ref 3.5–5.0)
Alkaline Phosphatase: 87 U/L (ref 38–126)
Anion gap: 15 (ref 5–15)
BUN: 39 mg/dL — ABNORMAL HIGH (ref 8–23)
CO2: 20 mmol/L — ABNORMAL LOW (ref 22–32)
Calcium: 9.4 mg/dL (ref 8.9–10.3)
Chloride: 98 mmol/L (ref 98–111)
Creatinine, Ser: 1.73 mg/dL — ABNORMAL HIGH (ref 0.61–1.24)
GFR, Estimated: 43 mL/min — ABNORMAL LOW (ref >60)
Glucose, Bld: 92 mg/dL (ref 70–99)
Potassium: 3.7 mmol/L (ref 3.5–5.1)
Sodium: 133 mmol/L — ABNORMAL LOW (ref 135–145)
Total Bilirubin: 1.4 mg/dL — ABNORMAL HIGH (ref 0.3–1.2)
Total Protein: 7.3 g/dL (ref 6.5–8.1)

## 2021-12-18 LAB — GLUCOSE, CAPILLARY
Glucose-Capillary: 101 mg/dL — ABNORMAL HIGH (ref 70–99)
Glucose-Capillary: 131 mg/dL — ABNORMAL HIGH (ref 70–99)
Glucose-Capillary: 146 mg/dL — ABNORMAL HIGH (ref 70–99)
Glucose-Capillary: 119 mg/dL — ABNORMAL HIGH (ref 70–99)

## 2021-12-18 LAB — PHOSPHORUS: Phosphorus: 4.9 mg/dL — ABNORMAL HIGH (ref 2.5–4.6)

## 2021-12-18 LAB — MAGNESIUM: Magnesium: 2 mg/dL (ref 1.7–2.4)

## 2021-12-18 MED ORDER — TAFAMIDIS 61 MG PO CAPS
1.0000 | ORAL_CAPSULE | Freq: Every day | ORAL | Status: DC
  Filled 2021-12-18: qty 1

## 2021-12-18 MED ORDER — FUROSEMIDE 10 MG/ML IJ SOLN
20.0000 mg | Freq: Two times a day (BID) | INTRAMUSCULAR | Status: DC
  Administered 2021-12-18 – 2021-12-19 (×4): 20 mg via INTRAVENOUS
  Filled 2021-12-18 (×4): qty 2

## 2021-12-18 MED ORDER — TAFAMIDIS 61 MG PO CAPS
61.0000 mg | ORAL_CAPSULE | Freq: Every day | ORAL | Status: DC
  Administered 2021-12-18: 61 mg via ORAL
  Filled 2021-12-18 (×2): qty 1

## 2021-12-18 MED ORDER — ADULT MULTIVITAMIN W/MINERALS CH
1.0000 | ORAL_TABLET | Freq: Every day | ORAL | Status: DC
Start: 2021-12-18 — End: 2021-12-20
  Administered 2021-12-18 – 2021-12-20 (×3): 1 via ORAL
  Filled 2021-12-18 (×3): qty 1

## 2021-12-18 MED ORDER — ENSURE MAX PROTEIN PO LIQD
11.0000 [oz_av] | Freq: Every day | ORAL | Status: DC
  Administered 2021-12-18: 11 [oz_av] via ORAL
  Administered 2021-12-19: 237 mL via ORAL
  Filled 2021-12-18 (×3): qty 330

## 2021-12-18 MED ORDER — SALINE SPRAY 0.65 % NA SOLN
1.0000 | NASAL | Status: DC | PRN
  Administered 2021-12-18: 1 via NASAL
  Filled 2021-12-18: qty 44

## 2021-12-18 MED ORDER — GLUCERNA SHAKE PO LIQD
237.0000 mL | Freq: Two times a day (BID) | ORAL | Status: DC
  Administered 2021-12-18 – 2021-12-20 (×4): 237 mL via ORAL

## 2021-12-18 NOTE — Progress Notes (Signed)
Progress Note   Patient: Frank Berg GYJ:[id number] DOB: [birth year] DOA: 12/16/2021     1 DOS: the patient was seen and examined on 12/18/2021   Brief hospital course: The Patient is a 66 year old thin African-American male with a past medical history significant for but not limited to atrial fibrillation on anticoagulant with Xarelto, CAD, type 2 diabetes mellitus with peripheral neuropathy, GERD, gout, dyslipidemia, hypertension, obstructive sleep apnea on CPAP, history of chronic systolic and diastolic CHF, history of transthyretin amyloidosis as well as other comorbidities who presents with cute onset of intermittent dyspnea in the last few weeks which is progressively gotten worse with coughing and wheezing.  He was admitted and expressed that he had orthopnea sleeping and had to sleep on an incline but does not have any extremity edema.  He also has PND and denies any nausea or vomiting.  He came to the ED and admitted to having some constipation.  While in the ED his heart respiratory rate and had a pulse oximetry of 100% and had to be placed on 2 L supplemental oxygen.  BNP was elevated at 633.8 higher than his prior.  High-sensitivity troponin was 86 and CBC was within normal limits.  EKG showed showed sinus bradycardia with first-degree AV block.  Chest x-ray showed cardiomegaly with no signs of pulm edema or infiltrates.  In the ED he was given IV Lasix and cardiology was consulted for further evaluation recommendations and he had an echocardiogram repeated.  Cardiology is recommending diuresis and continuing spironolactone and Jardiance.  He was given IV Lasix 60 mg and his Reds clip was 35%.  Cardiology planning on switching to oral diuresis in the morning and patient is in sinus rhythm today but is bradycardic.  Continuing anticoagulation with Xarelto.  Patient will need an ambulatory home O2 screen prior to discharge  Cardiology continuing to follow and appreciate further Recc's. He states  he is feeling better with diuresis. Repeat ECHO done and showed EF of 45-5% and Grade 3 DD with Severe Left Atrial and Right Atrial Dilation and biventricular Hypertrophy.   Assessment and Plan: No notes have been filed under this hospital service. Service: Hospitalist  Acute on chronic systolic and diastolic CHF with subsequent acute hypoxic respiratory failure.   -The patient had a 2D echo on 02/12/2021 revealing an EF of 45% and grade 2 diastolic dysfunction with mild to moderate left atrial dilatation and moderate right atrial dilatation with mild mitral regurgitation.  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 - The patient will be admitted to a cardiac telemetry bed. -BNP was elevated at 633.8 - Repeated ECHO this visit and showed EF of 45-50% with Grade 3 DD - We will continue diuresis with IV Lasix given borderline blood pressure. Card's Managing and reduced the dose to 20 mg IV BID - We will monitor I's and O's and daily weights. Patient is -4.395 Liters since admission  -SpO2: 100 % O2 Flow Rate (L/min): 2 L/min -Repeat CXR done and showed "Cardiomegaly. Small linear densities in the medial left lower lung fields may suggest  subsegmental atelectasis. There are no signs of pulmonary edema or focal pulmonary consolidation. Blunting of right lateral CP angle may suggest minimal effusion or pleural thickening."  - We will continue Jardiance and Aldactone. - We will continue Xarelto.  TTR Amyloidosis. - Resume tafamidis.  Paroxysmal Atrial Fibrillation. -Currently in SNR -We will continue Xarelto.  Dyslipidemia. -We will continue statin therapy.  Type 2 Diabetes Mellitus. - The patient  will be placed on supplement coverage with NovoLog and will hold off metformin.  Obstructive sleep apnea. - We will resume his CPAP nightly.   CKD Stage 3b Metabolic Acidosis Hyperphosphatemia  -Patient's BUN/Cr is now 34/1.69 yesterday and today is 39/1.73 -Continue with diuresis  as above and will avoid further nephrotoxic medications, contrast dyes, hypotension and renally adjust medications -Repeat CMP in the AM   Hyperbilirubinemia -Patient's T Bili is now 1.4 -Continue to Monitor and Trend and repeat CMP in the AM   Hyponatremia -Mild in the setting of Diuresis -Patient's Na+ has gone from 136 -> 133 -Continue to Monitor and Trend -Repeat CMP in the AM    Nutrition Documentation    Flowsheet Row ED to Hosp-Admission (Current) from 12/16/2021 in [facility]  Nutrition Problem Increased nutrient needs  Etiology chronic illness  [CHF]  Nutrition Goal Patient will meet greater than or equal to 90% of their needs  Interventions MVI, Ensure Enlive (each supplement provides 350kcal and 20 grams of protein), Liberalize Diet      Subjective: Seen and examined at bedside and thinks he is doing better after the diuresis.  No nausea or vomiting.  Denies any lightheadedness or dizziness.  Feels okay and less short of breath.  No other concerns or complaints at this time.  Physical Exam: Vitals:   12/18/21 0548 12/18/21 0724 12/18/21 1131 12/18/21 1608  BP:  104/77 105/80 115/83  Pulse:  69 67 64  Resp: 17 18 17 18   Temp:   (!) 97.4 F (36.3 C) 97.6 F (36.4 C)  TempSrc:  Oral Oral Oral  SpO2:  100% 99% 100%  Weight: 79.4 kg     Height:       Examination: Physical Exam:  Constitutional: Thin chronically ill-appearing African-American male, in no acute distress appears calm Neck: Appears supple with no appreciable JVD Respiratory: Diminished to auscultation bilaterally with coarse breath sounds and has some slight crackles.  No appreciable wheezing or rales.  Wearing no supplemental oxygen nasal cannula now Cardiovascular: RRR, no murmurs / rubs / gallops. S1 and S2 auscultated.  Minimal extremity edema Abdomen: Soft, non-tender, non-distended. Bowel sounds positive.  GU: Deferred. Musculoskeletal: No clubbing / cyanosis of digits/nails.  No joint deformity upper and lower extremities.  Skin: No rashes, lesions, ulcers on limited skin evaluation   Data Reviewed:  I have independently reviewed and interpreted the patient's clinical laboratory data and he has a sodium of 941, slight metabolic acidosis with a CO2 of 20, anion gap of 15, chloride level 98, BUN/creatinine is slightly elevated from yesterday in the setting of diuresis is now 39/1.73.  Phos level is elevated at 4.9 and his T. bili is now 1.4  Family Communication: No family currently at bedside  Disposition: Status is: Inpatient Remains inpatient appropriate because: Needs diuresis back to dry weight and cardiac clearance  Planned Discharge Destination: Home  DVT prophylaxis: Patient is anticoagulated with rivaroxaban  Author: Kerney Elbe, DO [facility]  12/18/2021 5:36 PM  For on call review [email or web address].

## 2021-12-18 NOTE — Progress Notes (Signed)
CSW acknowledges consult for SNF/HH. The patient will require PT/OT evaluations. TOC will assist with disposition planning once the evaluations have been completed.  °  °TOC will continue to follow.    °

## 2021-12-18 NOTE — Progress Notes (Signed)
Initial Nutrition Assessment  DOCUMENTATION CODES:   Not applicable  INTERVENTION:   -D/c Ensure Enlive -Glucerna Shake po BID, each supplement provides 220 kcal and 10 grams of protein  -Ensure Max po daily, each supplement provides 150 kcal and 30 grams of protein -MVI with minerals daily -Liberalize diet to 2 gram sodium for wider variety of meal selections  NUTRITION DIAGNOSIS:   Increased nutrient needs related to chronic illness (CHF) as evidenced by estimated needs.  GOAL:   Patient will meet greater than or equal to 90% of their needs  MONITOR:   PO intake, Supplement acceptance, Weight trends, Skin, I & O's  REASON FOR ASSESSMENT:   Malnutrition Screening Tool    ASSESSMENT:   Frank Berg is a 66 y.o. African-American male with medical history significant for atrial fibrillation on Xarelto, coronary artery disease, type 2 diabetes mellitus with peripheral neuropathy, GERD, gout, dyslipidemia, hypertension and obstructive sleep apnea on CPAP, who presented to the emergency room with the onset of intermittent dyspnea over the last few weeks with associated occasional cough productive of clear sputum as well as wheezing.  He admitted to dyspnea on exertion as well as orthopnea sleeping on incline in bed without lower extremity edema.  He admitted to paroxysmal nocturnal dyspnea.  He denies any chest pain or palpitations.  No nausea or vomiting or abdominal pain.  He admitted to constipation.  No fever or chills.  No dysuria, oliguria or hematuria or flank pain.  Pt admitted with CHF.   Reviewed I/O's: -2.8 L x 24 hours and -4.2 L since admission  UOP: 3.1 L x 24 hours   Pt unavailable at time of visit. Attempted to speak with pt via call to hospital room phone, however, unable to reach. RD unable to obtain further nutrition-related history or complete nutrition-focused physical exam at this time.    Pt currently on a heart healthy diet. Meal completions  variable; PO: 25-90%.   Reviewed wt hx; pt has experienced a 11.3% wt loss over the past 6 months, which is significant for time frame.   Pt is at high risk for malnutrition, however, unable to identify at this time. Pt would greatly benefit from addition of oral nutrition supplements. Will also liberalize diet to help promote adequate oral intake.   Medications reviewed and include lasix, magnesium oxide, and aldactone.  Lab Results  Component Value Date   HGBA1C 6.9 (H) 02/12/2021   PTA DM medications are 500 mg metformin BID.   Labs reviewed: Na: 133, CBGS: 101-131 (inpatient orders for glycemic control are none).    Diet Order:   Diet Order             Diet Heart Room service appropriate? Yes; Fluid consistency: Thin  Diet effective now                   EDUCATION NEEDS:   No education needs have been identified at this time  Skin:  Skin Assessment: Reviewed RN Assessment  Last BM:  12/16/21  Height:   Ht Readings from Last 1 Encounters:  12/17/21 6\' 2"  (1.88 m)    Weight:   Wt Readings from Last 1 Encounters:  12/18/21 79.4 kg    Ideal Body Weight:  86.4 kg  BMI:  Body mass index is 22.47 kg/m.  Estimated Nutritional Needs:   Kcal:  2671-2458  Protein:  120-140 grams  Fluid:  > 2 L    Loistine Chance, RD, LDN, [place] Registered Dietitian  II Certified Diabetes Care and Education Specialist Please refer to [facility] for RD and/or RD on-call/weekend/after hours pager

## 2021-12-18 NOTE — Progress Notes (Signed)
Advanced Heart Failure Rounding Note   Subjective:    Diuresing well. Weight down another 3 pounds. (10 pounds total)   Denies CP, SOB, orthopnea or PND  Scr stable.    Objective:   Weight Range:  Vital Signs:   Temp:  [97.4 F (36.3 C)-98.5 F (36.9 C)] 97.4 F (36.3 C) (02/25 1131) Pulse Rate:  [46-69] 67 (02/25 1131) Resp:  [14-18] 17 (02/25 1131) BP: (96-110)/(63-92) 105/80 (02/25 1131) SpO2:  [95 %-100 %] 99 % (02/25 1131) Weight:  [79.4 kg-80.9 kg] 79.4 kg (02/25 0548) Last BM Date : 12/16/21  Weight change: Filed Weights   12/16/21 2005 12/17/21 1600 12/18/21 0548  Weight: 83.9 kg 80.9 kg 79.4 kg    Intake/Output:   Intake/Output Summary (Last 24 hours) at 12/18/2021 1519 Last data filed at 12/18/2021 1325 Gross per 24 hour  Intake 1080 ml  Output 2925 ml  Net -1845 ml     Physical Exam: General:  Sitting in chair No resp difficulty HEENT: normal Neck: supple. JVP to jaw . Carotids 2+ bilat; no bruits. No lymphadenopathy or thryomegaly appreciated. Cor: PMI nondisplaced. Regular rate & rhythm. 2/6 TR Lungs: clear Abdomen: soft, nontender, + distended. No hepatosplenomegaly. No bruits or masses. Good bowel sounds. Extremities: no cyanosis, clubbing, rash, 1-2 edema Neuro: alert & orientedx3, cranial nerves grossly intact. moves all 4 extremities w/o difficulty. Affect pleasant  Telemetry: Sinus 60-70 Personally reviewed  Labs: Basic Metabolic Panel: Recent Labs  Lab 12/16/21 2022 12/18/21 0156  NA 136 133*  K 4.5 3.7  CL 103 98  CO2 22 20*  GLUCOSE 119* 92  BUN 34* 39*  CREATININE 1.69* 1.73*  CALCIUM 9.5 9.4  MG  --  2.0  PHOS  --  4.9*    Liver Function Tests: Recent Labs  Lab 12/18/21 0156  AST 31  ALT 8  ALKPHOS 87  BILITOT 1.4*  PROT 7.3  ALBUMIN 3.8   No results for input(s): LIPASE, AMYLASE in the last 168 hours. No results for input(s): AMMONIA in the last 168 hours.  CBC: Recent Labs  Lab 12/16/21 2022  12/18/21 0156  WBC 4.8 5.2  NEUTROABS 2.5 2.5  HGB 14.3 13.8  HCT 44.1 40.0  MCV 87.8 83.7  PLT 288 255    Cardiac Enzymes: No results for input(s): CKTOTAL, CKMB, CKMBINDEX, TROPONINI in the last 168 hours.  BNP: BNP (last 3 results) Recent Labs    09/27/21 0115 11/04/21 1506 12/16/21 2023  BNP 431.9* 337.1* 633.8*    ProBNP (last 3 results) Recent Labs    02/09/21 1203 05/25/21 1345 07/06/21 0942  PROBNP 792* 1,236* 2,052*      Other results:  Imaging: DG Chest 2 View  Result Date: 12/16/2021 CLINICAL DATA:  Shortness of breath EXAM: CHEST - 2 VIEW COMPARISON:  Previous studies including the examination of 09/27/2021 FINDINGS: Transverse diameter of heart is increased. There is previous prosthetic aortic valve placement. There is previous surgical fusion in the lower cervical spine. There are no signs of pulmonary edema or focal pulmonary consolidation. Blunting of right lateral CP angle and pleural thickening in the lateral aspect of right lower lung fields have not changed significantly. Left lateral CP angle is clear. There is no pneumothorax. IMPRESSION: Cardiomegaly. There are no signs of pulmonary edema or new focal infiltrates. Other findings as described in the body of the report. Electronically Signed   By: Elmer Picker M.D.   On: 12/16/2021 20:52   DG CHEST PORT  1 VIEW  Result Date: 12/18/2021 CLINICAL DATA:  Shortness of breath EXAM: PORTABLE CHEST 1 VIEW COMPARISON:  Previous studies including the examination of 12/16/2021 FINDINGS: Transverse diameter of heart is increased. There are no signs of pulmonary edema. Small linear densities are seen in the medial left lower lung fields. There is blunting of right lateral CP angle. Left lateral CP angle is clear. There is no pneumothorax. There is previous placement of prosthetic aortic valve. There is surgical fusion in the lower cervical spine. IMPRESSION: Cardiomegaly. Small linear densities in the medial  left lower lung fields may suggest subsegmental atelectasis. There are no signs of pulmonary edema or focal pulmonary consolidation. Blunting of right lateral CP angle may suggest minimal effusion or pleural thickening. Electronically Signed   By: Elmer Picker M.D.   On: 12/18/2021 09:14   ECHOCARDIOGRAM COMPLETE  Result Date: 12/17/2021    ECHOCARDIOGRAM REPORT   Patient Name:   HASSON GASPARD Date of Exam: 12/17/2021 Medical Rec #:  [id number]         Height:       74.0 in Accession #:    [id number]        Weight:       185.0 lb Date of Birth:  08-Mar-1956          BSA:          2.103 m Patient Age:    66 years          BP:           107/86 mmHg Patient Gender: M                 HR:           68 bpm. Exam Location:  Inpatient Procedure: 2D Echo, 3D Echo, Cardiac Doppler and Color Doppler Indications:    CHF-Acute Systolic D14.97  History:        Patient has prior history of Echocardiogram examinations, most                 recent 02/12/2021. CAD, Prior CABG; Risk Factors:Hypertension,                 Dyslipidemia, Diabetes and Sleep Apnea. GERD. Cardiac                 amyloidosis. Chronic kidney disease.                 Aortic Valve: Bovine valve is present in the aortic position.                 Procedure Date: 2011.  Sonographer:    Darlina Sicilian RDCS Referring Phys: [id number] JAN A MANSY  Sonographer Comments: Global longitudinal strain was attempted. IMPRESSIONS  1. Left ventricular ejection fraction, by estimation, is 50 to 55%. The left ventricle has low normal function. The left ventricle has no regional wall motion abnormalities. There is severe left ventricular hypertrophy. Left ventricular diastolic parameters are consistent with Grade III diastolic dysfunction (restrictive).  2. Right ventricular systolic function is moderately reduced. The right ventricular size is severely enlarged. Severely increased right ventricular wall thickness. There is mildly elevated pulmonary artery systolic  pressure.  3. Left atrial size was severely dilated.  4. Right atrial size was severely dilated.  5. The mitral valve is normal in structure. Mild mitral valve regurgitation. No evidence of mitral stenosis.  6. Tricuspid valve leaflets do not coapt due to dilated RV. Tricuspid valve regurgitation is severe.  7. The aortic valve is tricuspid. There is moderate thickening of the aortic valve. Aortic valve regurgitation is trivial. Mild aortic valve stenosis. There is a Bovine valve present in the aortic position. Procedure Date: 2011.  8. Pulmonic valve regurgitation is moderate.  9. The inferior vena cava is dilated in size with <50% respiratory variability, suggesting right atrial pressure of 15 mmHg. 10. Echo with severe biventricular hypertrophy and thickened valves consitent with patient's known history of cardiac amyloidosis. FINDINGS  Left Ventricle: Left ventricular ejection fraction, by estimation, is 50 to 55%. The left ventricle has low normal function. The left ventricle has no regional wall motion abnormalities. The left ventricular internal cavity size was normal in size. There is severe left ventricular hypertrophy. Left ventricular diastolic parameters are consistent with Grade III diastolic dysfunction (restrictive). Right Ventricle: The right ventricular size is severely enlarged. Severely increased right ventricular wall thickness. Right ventricular systolic function is moderately reduced. There is mildly elevated pulmonary artery systolic pressure. The tricuspid regurgitant velocity is 2.58 m/s, and with an assumed right atrial pressure of 15 mmHg, the estimated right ventricular systolic pressure is 62.8 mmHg. Left Atrium: Left atrial size was severely dilated. Right Atrium: Right atrial size was severely dilated. Pericardium: There is no evidence of pericardial effusion. Mitral Valve: The mitral valve is normal in structure. There is moderate thickening of the mitral valve leaflet(s). Mild mitral  valve regurgitation. No evidence of mitral valve stenosis. Tricuspid Valve: Tricuspid valve leaflets do not coapt due to dilated RV. The tricuspid valve is normal in structure. Tricuspid valve regurgitation is severe. No evidence of tricuspid stenosis. Aortic Valve: The aortic valve is tricuspid. There is moderate thickening of the aortic valve. Aortic valve regurgitation is trivial. Mild aortic stenosis is present. Aortic valve mean gradient measures 10.8 mmHg. Aortic valve peak gradient measures 19.0  mmHg. Aortic valve area, by VTI measures 2.13 cm. There is a Bovine valve present in the aortic position. Procedure Date: 2011. Pulmonic Valve: The pulmonic valve was normal in structure. Pulmonic valve regurgitation is moderate. No evidence of pulmonic stenosis. Aorta: The aortic root is normal in size and structure. Venous: The inferior vena cava is dilated in size with less than 50% respiratory variability, suggesting right atrial pressure of 15 mmHg. IAS/Shunts: No atrial level shunt detected by color flow Doppler.  LEFT VENTRICLE PLAX 2D LVIDd:         4.90 cm   Diastology LVIDs:         3.60 cm   LV e' medial:    3.37 cm/s LV PW:         1.80 cm   LV E/e' medial:  17.8 LV IVS:        1.90 cm   LV e' lateral:   4.40 cm/s LVOT diam:     2.70 cm   LV E/e' lateral: 13.6 LV SV:         80 LV SV Index:   38 LVOT Area:     5.73 cm                           3D Volume EF:                          3D EF:        41 %  LV EDV:       137 ml                          LV ESV:       81 ml                          LV SV:        56 ml RIGHT VENTRICLE RV S prime:     6.80 cm/s TAPSE (M-mode): 1.1 cm LEFT ATRIUM             Index        RIGHT ATRIUM           Index LA diam:        4.00 cm 1.90 cm/m   RA Area:     23.80 cm LA Vol (A2C):   67.7 ml 32.20 ml/m  RA Volume:   74.20 ml  35.29 ml/m LA Vol (A4C):   51.5 ml 24.49 ml/m LA Biplane Vol: 63.1 ml 30.01 ml/m  AORTIC VALVE AV Area (Vmax):    2.05 cm  AV Area (Vmean):   1.92 cm AV Area (VTI):     2.13 cm AV Vmax:           218.00 cm/s AV Vmean:          151.000 cm/s AV VTI:            0.377 m AV Peak Grad:      19.0 mmHg AV Mean Grad:      10.8 mmHg LVOT Vmax:         77.90 cm/s LVOT Vmean:        50.600 cm/s LVOT VTI:          0.140 m LVOT/AV VTI ratio: 0.37  AORTA Ao Root diam: 3.70 cm Ao Asc diam:  3.80 cm MITRAL VALVE               TRICUSPID VALVE MV Area (PHT): 3.11 cm    TR Peak grad:   26.6 mmHg MV Decel Time: 244 msec    TR Vmax:        258.00 cm/s MV E velocity: 60.00 cm/s MV A velocity: 46.20 cm/s  SHUNTS MV E/A ratio:  1.30        Systemic VTI:  0.14 m                            Systemic Diam: 2.70 cm Glori Bickers MD Electronically signed by Glori Bickers MD Signature Date/Time: 12/17/2021/4:22:01 PM    Final      Medications:     Scheduled Medications:  allopurinol  300 mg Oral Daily   empagliflozin  10 mg Oral QAC breakfast   feeding supplement (GLUCERNA SHAKE)  237 mL Oral BID BM   furosemide  20 mg Intravenous BID   magnesium oxide  400 mg Oral Daily   midodrine  5 mg Oral TID WC   multivitamin with minerals  1 tablet Oral Daily   Ensure Max Protein  11 oz Oral QHS   rivaroxaban  20 mg Oral QPM   rosuvastatin  5 mg Oral QPM   spironolactone  12.5 mg Oral Daily   Tafamidis  1 capsule Oral Daily    Infusions:   PRN Medications: acetaminophen **OR** acetaminophen, colchicine, loratadine, magnesium hydroxide, ondansetron **OR** ondansetron (ZOFRAN) IV, sodium  chloride, traZODone   Assessment/Plan:   1. 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 - Echo 12/17/20: EF 45-50% severe LVH RV mod HK - PYP 1/23 markedly positive for TTR - Volume overload on admit. BNP 633.   -  Diuresing well. Weight down 10 pounds. Still volume overloaded with R>L HF.  - Scr stable at 1.7 - Continue tafamidis.  - Continue midodrine 5 mg tid.  - No room for BB with bradycardia.  - Continue spiro 12.5 mg daily  -  continue jardiance 10 mg daily    2. PAF - in SR today  -On xarelto   3. OSA -Continue CPAP nightly    4. CKD Stage IIIb -Creatinine baseline 1.7-2 -Creatinine 1.7 today.    5. DMII On SSI    Length of Stay: 1   Glori Bickers 12/18/2021, 3:19 PM  Advanced Heart Failure Team Pager [phone] (M-F; 7a - 4p)  Please contact [facility] for night-coverage after hours (4p -7a ) and weekends on [email or web address]

## 2021-12-18 NOTE — Evaluation (Signed)
Occupational Therapy Evaluation Patient Details Name: Frank Berg MRN: [id number] DOB: 05/20/1956 Today's Date: 12/18/2021   History of Present Illness Pt is a 66 yo male s/p CHF and Afib. PMHx: CAD, stent, DMT2.   Clinical Impression   Pt PTA: Pt living alone, independently with a neighbor to call on if needed. Pt CLOF: Independent with ADL and mobility. Pt ambulating in room, hallway >200' with no AD and ambulating up/down a flight of stairs with no adverse reactions.  O2 >90% with no SOB noted; HR <100 BPM with exertion. Pt at his functional baseline at this time for ADL and mobility. No further OT services required. OT signing off. Thank you for referral.   Recommendations for follow up therapy are one component of a multi-disciplinary discharge planning process, led by the attending physician.  Recommendations may be updated based on patient status, additional functional criteria and insurance authorization.   Follow Up Recommendations  No OT follow up    Assistance Recommended at Discharge None  Patient can return home with the following      Functional Status Assessment  Patient has had a recent decline in their functional status and demonstrates the ability to make significant improvements in function in a reasonable and predictable amount of time.  Equipment Recommendations  None recommended by OT    Recommendations for Other Services       Precautions / Restrictions Precautions Precautions: None Restrictions Weight Bearing Restrictions: No      Mobility Bed Mobility Overal bed mobility: Independent                  Transfers Overall transfer level: Independent                        Balance Overall balance assessment: Modified Independent                                         ADL either performed or assessed with clinical judgement   ADL Overall ADL's : Independent                                        General ADL Comments: no physical assist; bent over to don shoes and stood at sink for ADL tasks.     Vision Baseline Vision/History: 1 Wears glasses Ability to See in Adequate Light: 0 Adequate Vision Assessment?: No apparent visual deficits     Perception     Praxis      Pertinent Vitals/Pain Pain Assessment Pain Assessment: No/denies pain     Hand Dominance Right   Extremity/Trunk Assessment Upper Extremity Assessment Upper Extremity Assessment: Overall WFL for tasks assessed   Lower Extremity Assessment Lower Extremity Assessment: Generalized weakness;Overall [facility] for tasks assessed   Cervical / Trunk Assessment Cervical / Trunk Assessment: Normal   Communication Communication Communication: No difficulties   Cognition Arousal/Alertness: Awake/alert Behavior During Therapy: WFL for tasks assessed/performed Overall Cognitive Status: Within Functional Limits for tasks assessed                                       General Comments  O2 >90% with no SOB noted; HR <100 BPM with exertion. Pt  ambulating in room, hallway >200' with no AD and ambulating up/down a flight of stairs with no adverse reactions.    Exercises     Shoulder Instructions      Home Living Family/patient expects to be discharged to:: Private residence Living Arrangements: Alone Available Help at Discharge: Friend(s);Available PRN/intermittently Type of Home: House Home Access: Level entry     Home Layout: Two level;Bed/bath upstairs     Bathroom Shower/Tub: Walk-in shower   Bathroom Toilet: Handicapped height     Home Equipment: Cane - single point          Prior Functioning/Environment Prior Level of Function : Independent/Modified Independent             Mobility Comments: SPC occasionally ADLs Comments: Independent. "I can ask my neighbor across the street for help. I am self sufficient."        OT Problem List: Decreased activity tolerance       OT Treatment/Interventions:      OT Goals(Current goals can be found in the care plan section) Acute Rehab OT Goals Patient Stated Goal: to go home soon OT Goal Formulation: All assessment and education complete, DC therapy Potential to Achieve Goals: Good  OT Frequency:      Co-evaluation              AM-PAC OT "6 Clicks" Daily Activity     Outcome Measure Help from another person eating meals?: None Help from another person taking care of personal grooming?: None Help from another person toileting, which includes using toliet, bedpan, or urinal?: None Help from another person bathing (including washing, rinsing, drying)?: None Help from another person to put on and taking off regular upper body clothing?: None Help from another person to put on and taking off regular lower body clothing?: None 6 Click Score: 24   End of Session Nurse Communication: Mobility status  Activity Tolerance: Patient tolerated treatment well Patient left: in chair;with call bell/phone within reach  OT Visit Diagnosis: Unsteadiness on feet (R26.81)                Time: 7681-1572 OT Time Calculation (min): 20 min Charges:  OT General Charges $OT Visit: 1 Visit OT Evaluation $OT Eval Moderate Complexity: 1 Mod  Jefferey Pica, OTR/L Acute Rehabilitation Services Office: [phone]   Xzavien Harada C 12/18/2021, 2:04 PM

## 2021-12-18 NOTE — Progress Notes (Signed)
PT Cancellation Note  Patient Details Name: Frank Berg MRN: [id number] DOB: 1955/12/22   Cancelled Treatment:    Reason Eval/Treat Not Completed: PT screened, no needs identified, will sign off (pt seen by OT and walking independently in hall. No current acute P.T. needs)   Abigail Teall B Desirie Minteer 12/18/2021, 1:26 PM Bayard Males, PT Acute Rehabilitation Services Pager: [phone] Office: [phone]

## 2021-12-18 NOTE — Hospital Course (Addendum)
The Patient is a 66 year old thin African-American male with a past medical history significant for but not limited to atrial fibrillation on anticoagulant with Xarelto, CAD, type 2 diabetes mellitus with peripheral neuropathy, GERD, gout, dyslipidemia, hypertension, obstructive sleep apnea on CPAP, history of chronic systolic and diastolic CHF, history of transthyretin amyloidosis as well as other comorbidities who presents with cute onset of intermittent dyspnea in the last few weeks which is progressively gotten worse with coughing and wheezing.  He was admitted and expressed that he had orthopnea sleeping and had to sleep on an incline but does not have any extremity edema.  He also has PND and denies any nausea or vomiting.  He came to the ED and admitted to having some constipation.  While in the ED his heart respiratory rate and had a pulse oximetry of 100% and had to be placed on 2 L supplemental oxygen.  BNP was elevated at 633.8 higher than his prior.  High-sensitivity troponin was 86 and CBC was within normal limits.  EKG showed showed sinus bradycardia with first-degree AV block.  Chest x-ray showed cardiomegaly with no signs of pulm edema or infiltrates.  In the ED he was given IV Lasix and cardiology was consulted for further evaluation recommendations and he had an echocardiogram repeated.  Cardiology is recommending diuresis and continuing spironolactone and Jardiance.  He was given IV Lasix 60 mg and his Reds clip was 35%.  Cardiology planning on switching to oral diuresis in the morning and patient is in sinus rhythm today but is bradycardic.  Continuing anticoagulation with Xarelto.  Patient will need an ambulatory home O2 screen prior to discharge  Cardiology continuing to follow and appreciate further Recc's. He states he is feeling better with diuresis. Repeat ECHO done and showed EF of 45-5% and Grade 3 DD with Severe Left Atrial and Right Atrial Dilation and biventricular Hypertrophy.    Cardiology evaluated today and felt that his volume status was much better and acceptable for discharge.  They recommended transitioning to p.o. diuretics and discharged home on torsemide 40 mg p.o. daily.  He will need to follow-up with PCP and cardiology in outpatient setting given that he is medically stable for discharge

## 2021-12-19 ENCOUNTER — Inpatient Hospital Stay (HOSPITAL_COMMUNITY): Payer: Medicare HMO

## 2021-12-19 LAB — CBC WITH DIFFERENTIAL/PLATELET
Abs Immature Granulocytes: 0.01 10*3/uL (ref 0.00–0.07)
Basophils Absolute: 0.1 10*3/uL (ref 0.0–0.1)
Basophils Relative: 1 %
Eosinophils Absolute: 0.2 10*3/uL (ref 0.0–0.5)
Eosinophils Relative: 3 %
HCT: 40.6 % (ref 39.0–52.0)
Hemoglobin: 14.3 g/dL (ref 13.0–17.0)
Immature Granulocytes: 0 %
Lymphocytes Relative: 41 %
Lymphs Abs: 1.9 10*3/uL (ref 0.7–4.0)
MCH: 29.1 pg (ref 26.0–34.0)
MCHC: 35.2 g/dL (ref 30.0–36.0)
MCV: 82.5 fL (ref 80.0–100.0)
Monocytes Absolute: 0.4 10*3/uL (ref 0.1–1.0)
Monocytes Relative: 8 %
Neutro Abs: 2.1 10*3/uL (ref 1.7–7.7)
Neutrophils Relative %: 47 %
Platelets: 267 10*3/uL (ref 150–400)
RBC: 4.92 MIL/uL (ref 4.22–5.81)
RDW: 15.3 % (ref 11.5–15.5)
WBC: 4.6 10*3/uL (ref 4.0–10.5)
nRBC: 0 % (ref 0.0–0.2)

## 2021-12-19 LAB — COMPREHENSIVE METABOLIC PANEL
ALT: 9 U/L (ref 0–44)
AST: 30 U/L (ref 15–41)
Albumin: 3.8 g/dL (ref 3.5–5.0)
Alkaline Phosphatase: 89 U/L (ref 38–126)
Anion gap: 13 (ref 5–15)
BUN: 42 mg/dL — ABNORMAL HIGH (ref 8–23)
CO2: 22 mmol/L (ref 22–32)
Calcium: 9.6 mg/dL (ref 8.9–10.3)
Chloride: 98 mmol/L (ref 98–111)
Creatinine, Ser: 1.4 mg/dL — ABNORMAL HIGH (ref 0.61–1.24)
GFR, Estimated: 55 mL/min — ABNORMAL LOW (ref >60)
Glucose, Bld: 94 mg/dL (ref 70–99)
Potassium: 3.8 mmol/L (ref 3.5–5.1)
Sodium: 133 mmol/L — ABNORMAL LOW (ref 135–145)
Total Bilirubin: 1.2 mg/dL (ref 0.3–1.2)
Total Protein: 7.8 g/dL (ref 6.5–8.1)

## 2021-12-19 LAB — GLUCOSE, CAPILLARY
Glucose-Capillary: 92 mg/dL (ref 70–99)
Glucose-Capillary: 114 mg/dL — ABNORMAL HIGH (ref 70–99)
Glucose-Capillary: 122 mg/dL — ABNORMAL HIGH (ref 70–99)
Glucose-Capillary: 110 mg/dL — ABNORMAL HIGH (ref 70–99)

## 2021-12-19 LAB — PHOSPHORUS: Phosphorus: 4.8 mg/dL — ABNORMAL HIGH (ref 2.5–4.6)

## 2021-12-19 LAB — MAGNESIUM: Magnesium: 2.1 mg/dL (ref 1.7–2.4)

## 2021-12-19 MED ORDER — TAFAMIDIS 61 MG PO CAPS
61.0000 mg | ORAL_CAPSULE | Freq: Every day | ORAL | Status: DC
  Administered 2021-12-19 – 2021-12-20 (×2): 61 mg via ORAL
  Filled 2021-12-19: qty 1

## 2021-12-19 NOTE — Progress Notes (Signed)
Progress Note   Patient: Frank Berg SEG:[id number] DOB: [birth year] DOA: 12/16/2021     2 DOS: the patient was seen and examined on 12/19/2021   Brief hospital course: The Patient is a 66 year old thin African-American male with a past medical history significant for but not limited to atrial fibrillation on anticoagulant with Xarelto, CAD, type 2 diabetes mellitus with peripheral neuropathy, GERD, gout, dyslipidemia, hypertension, obstructive sleep apnea on CPAP, history of chronic systolic and diastolic CHF, history of transthyretin amyloidosis as well as other comorbidities who presents with cute onset of intermittent dyspnea in the last few weeks which is progressively gotten worse with coughing and wheezing.  He was admitted and expressed that he had orthopnea sleeping and had to sleep on an incline but does not have any extremity edema.  He also has PND and denies any nausea or vomiting.  He came to the ED and admitted to having some constipation.  While in the ED his heart respiratory rate and had a pulse oximetry of 100% and had to be placed on 2 L supplemental oxygen.  BNP was elevated at 633.8 higher than his prior.  High-sensitivity troponin was 86 and CBC was within normal limits.  EKG showed showed sinus bradycardia with first-degree AV block.  Chest x-ray showed cardiomegaly with no signs of pulm edema or infiltrates.  In the ED he was given IV Lasix and cardiology was consulted for further evaluation recommendations and he had an echocardiogram repeated.  Cardiology is recommending diuresis and continuing spironolactone and Jardiance.  He was given IV Lasix 60 mg and his Reds clip was 35%.  Cardiology planning on switching to oral diuresis in the morning and patient is in sinus rhythm today but is bradycardic.  Continuing anticoagulation with Xarelto.  Patient will need an ambulatory home O2 screen prior to discharge  Cardiology continuing to follow and appreciate further Recc's. He states  he is feeling better with diuresis. Repeat ECHO done and showed EF of 45-5% and Grade 3 DD with Severe Left Atrial and Right Atrial Dilation and biventricular Hypertrophy.   Assessment and Plan: No notes have been filed under this hospital service. Service: Hospitalist  Acute on chronic systolic and diastolic CHF with subsequent acute hypoxic respiratory failure in the setting of TTR Cardiac Amyloidosis .   -The patient had a 2D echo on 02/12/2021 revealing an EF of 45% and grade 2 diastolic dysfunction with mild to moderate left atrial dilatation and moderate right atrial dilatation with mild mitral regurgitation.  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 - The patient will be admitted to a cardiac telemetry bed. -BNP was elevated at 633.8 - Repeated ECHO this visit and showed EF of 45-50% with Grade 3 DD - We will continue diuresis with IV Lasix given borderline blood pressure. Card's Managing and reduced the dose to 20 mg IV BID yesterday and recommending one more dose of IV Lasix today with anticipated D/C home Tomorrow  - We will monitor I's and O's and daily weights. Patient is -5.785 Liters since admission and weight is down 13 lbs -SpO2: 97 % O2 Flow Rate (L/min): 2 L/min; Weaned off of Supplemental O2 via [place] -Continue Midodrine 5 mg po TID -Continuous Pulse Oximetry and Maintain O2 Sats >90% -Continue Supplemental O2 via  and wean O2 as tolerated -Repeat CXR done and showed "Cardiomegaly. There are no signs of pulmonary edema or focal pulmonary consolidation."  - We will continue Jardiance and Aldactone. - We will continue Xarelto. -  Will do an Ambulatory Home O2 Screen in the AM   TTR Amyloidosis. - Continue Tafamidis 61 mg po Daily   Paroxysmal Atrial Fibrillation. -Currently in SNR -We will continue Rivaroxaban 20 mg po Daily. -Not on a BB  Dyslipidemia. -We will continue statin therapy with Rosuvastatin 5 mg po Daily.  Type 2 Diabetes Mellitus. - The patient  will be placed on supplement coverage with NovoLog and will hold off metformin. -CBG's ranging from 92-146  Obstructive sleep apnea. - We will resume his CPAP nightly.   CKD Stage 3b Metabolic Acidosis Hyperphosphatemia  -Patient's BUN/Cr has gone from 34/1.69 -> 39/1.73 -> 42/1.40 -Continue with diuresis as above and will avoid further nephrotoxic medications, contrast dyes, hypotension and renally adjust medications -Repeat CMP in the AM    Hyperbilirubinemia -Patient's T Bili is now 1.4 -> 12.2  -Continue to Monitor and Trend and repeat CMP in the AM    Hyponatremia -Mild in the setting of Diuresis -Patient's Na+ has gone from 136 -> 133 x2 -Continue to Monitor and Trend -Repeat CMP in the AM   Nutrition Documentation    Flowsheet Row ED to Hosp-Admission (Current) from 12/16/2021 in [place] [facility] PCU  Nutrition Problem Increased nutrient needs  Etiology chronic illness  [CHF]  Nutrition Goal Patient will meet greater than or equal to 90% of their needs  Interventions MVI, Ensure Enlive (each supplement provides 350kcal and 20 grams of protein), Liberalize Diet      Subjective: Seen and examined at bedside and he is doing much better today and denies any shortness of breath.  States he has been urinating quite frequently.  No nausea or vomiting.  Denies lightheadedness or dizziness.  No other concerns or complaints at this time  Physical Exam: Vitals:   12/18/21 1956 12/19/21 0515 12/19/21 0517 12/19/21 1154  BP: 113/79 118/80  101/80  Pulse: 62 (!) 59  60  Resp: 17 16  17   Temp: 97.8 F (36.6 C) 97.6 F (36.4 C)  (!) 97.4 F (36.3 C)  TempSrc: Oral Oral  Oral  SpO2: 100% 100%  97%  Weight:   78.4 kg   Height:       Examination: Physical Exam:  Constitutional: Thin chronically ill-appearing African-American male currently no acute distress appears calm Respiratory: Diminished to auscultation bilaterally with some coarse breath sounds and some slight  crackles.  Not wearing any supplemental oxygen via nasal cannula Cardiovascular: RRR, no murmurs / rubs / gallops. S1 and S2 auscultated.  No appreciable lower extremity edema noted Abdomen: Soft, non-tender, non-distended. Bowel sounds positive.  GU: Deferred.  Neurologic: CN 2-12 grossly intact with no focal deficits.   Data Reviewed:  I have independently reviewed and assessed the patient's clinical laboratory data including CMP and CBC.  Patient's sodium is now 133 and his BUN/creatinine is now 42/1.40 with a Phos level of 4.8  Family Communication: No family currently at bedside  Disposition: Status is: Inpatient Remains inpatient appropriate because: Needs to be diuresed vital back to dry weight   Planned Discharge Destination: Home  DVT Prophylaxis: Rivaroxaban 20 mg po qHS  Author: Raiford Noble, DO [facility]  12/19/2021 2:05 PM  For on call review [email or web address].

## 2021-12-19 NOTE — Progress Notes (Signed)
Advanced Heart Failure Rounding Note   Subjective:    Diuresing well. Weight down another 3 pounds. (13 pounds total)   Denies CP, SOB, orthopnea or PND.   Scr improved 1.7-> 1.4   Objective:   Weight Range:  Vital Signs:   Temp:  [97.4 F (36.3 C)-97.8 F (36.6 C)] 97.6 F (36.4 C) (02/26 0515) Pulse Rate:  [59-67] 59 (02/26 0515) Resp:  [16-18] 16 (02/26 0515) BP: (105-118)/(79-83) 118/80 (02/26 0515) SpO2:  [99 %-100 %] 100 % (02/26 0515) Weight:  [78.4 kg] 78.4 kg (02/26 0517) Last BM Date : 12/16/21  Weight change: Filed Weights   12/17/21 1600 12/18/21 0548 12/19/21 0517  Weight: 80.9 kg 79.4 kg 78.4 kg    Intake/Output:   Intake/Output Summary (Last 24 hours) at 12/19/2021 1022 Last data filed at 12/19/2021 0933 Gross per 24 hour  Intake 1320 ml  Output 2775 ml  Net -1455 ml      Physical Exam: General:  Well appearing. No resp difficulty HEENT: normal Neck: supple. JVP 6-7 Carotids 2+ bilat; no bruits. No lymphadenopathy or thryomegaly appreciated. Cor: PMI nondisplaced. Regular rate & rhythm. No rubs, gallops or murmurs. Lungs: clear Abdomen: soft, nontender, nondistended. No hepatosplenomegaly. No bruits or masses. Good bowel sounds. Extremities: no cyanosis, clubbing, rash, edema Neuro: alert & orientedx3, cranial nerves grossly intact. moves all 4 extremities w/o difficulty. Affect pleasant   Telemetry: Sinus 60s Personally reviewed   Labs: Basic Metabolic Panel: Recent Labs  Lab 12/16/21 2022 12/18/21 0156 12/19/21 0257  NA 136 133* 133*  K 4.5 3.7 3.8  CL 103 98 98  CO2 22 20* 22  GLUCOSE 119* 92 94  BUN 34* 39* 42*  CREATININE 1.69* 1.73* 1.40*  CALCIUM 9.5 9.4 9.6  MG  --  2.0 2.1  PHOS  --  4.9* 4.8*     Liver Function Tests: Recent Labs  Lab 12/18/21 0156 12/19/21 0257  AST 31 30  ALT 8 9  ALKPHOS 87 89  BILITOT 1.4* 1.2  PROT 7.3 7.8  ALBUMIN 3.8 3.8    No results for input(s): LIPASE, AMYLASE in the  last 168 hours. No results for input(s): AMMONIA in the last 168 hours.  CBC: Recent Labs  Lab 12/16/21 2022 12/18/21 0156 12/19/21 0257  WBC 4.8 5.2 4.6  NEUTROABS 2.5 2.5 2.1  HGB 14.3 13.8 14.3  HCT 44.1 40.0 40.6  MCV 87.8 83.7 82.5  PLT 288 255 267     Cardiac Enzymes: No results for input(s): CKTOTAL, CKMB, CKMBINDEX, TROPONINI in the last 168 hours.  BNP: BNP (last 3 results) Recent Labs    09/27/21 0115 11/04/21 1506 12/16/21 2023  BNP 431.9* 337.1* 633.8*     ProBNP (last 3 results) Recent Labs    02/09/21 1203 05/25/21 1345 07/06/21 0942  PROBNP 792* 1,236* 2,052*       Other results:  Imaging: DG CHEST PORT 1 VIEW  Result Date: 12/19/2021 CLINICAL DATA:  Shortness of breath EXAM: PORTABLE CHEST 1 VIEW COMPARISON:  12/18/2021 FINDINGS: Transverse diameter of heart is increased. Low position of diaphragms suggests possible COPD. There is evidence of previous placement of prosthetic aortic valve. Blunting of right lateral CP angle has not changed, possibly suggesting pleural thickening. There is interval clearing of linear densities in the medial left lower lung fields suggesting resolution of subsegmental atelectasis. There are no new infiltrates or signs of pulmonary edema. There is no pneumothorax. IMPRESSION: Cardiomegaly. There are no signs of pulmonary edema  or focal pulmonary consolidation. Electronically Signed   By: Elmer Picker M.D.   On: 12/19/2021 09:29   DG CHEST PORT 1 VIEW  Result Date: 12/18/2021 CLINICAL DATA:  Shortness of breath EXAM: PORTABLE CHEST 1 VIEW COMPARISON:  Previous studies including the examination of 12/16/2021 FINDINGS: Transverse diameter of heart is increased. There are no signs of pulmonary edema. Small linear densities are seen in the medial left lower lung fields. There is blunting of right lateral CP angle. Left lateral CP angle is clear. There is no pneumothorax. There is previous placement of prosthetic  aortic valve. There is surgical fusion in the lower cervical spine. IMPRESSION: Cardiomegaly. Small linear densities in the medial left lower lung fields may suggest subsegmental atelectasis. There are no signs of pulmonary edema or focal pulmonary consolidation. Blunting of right lateral CP angle may suggest minimal effusion or pleural thickening. Electronically Signed   By: Elmer Picker M.D.   On: 12/18/2021 09:14     Medications:     Scheduled Medications:  allopurinol  300 mg Oral Daily   empagliflozin  10 mg Oral QAC breakfast   feeding supplement (GLUCERNA SHAKE)  237 mL Oral BID BM   furosemide  20 mg Intravenous BID   magnesium oxide  400 mg Oral Daily   midodrine  5 mg Oral TID WC   multivitamin with minerals  1 tablet Oral Daily   Ensure Max Protein  11 oz Oral QHS   rivaroxaban  20 mg Oral QPM   rosuvastatin  5 mg Oral QPM   spironolactone  12.5 mg Oral Daily   Tafamidis  61 mg Oral Daily    Infusions:   PRN Medications: acetaminophen **OR** acetaminophen, colchicine, loratadine, magnesium hydroxide, ondansetron **OR** ondansetron (ZOFRAN) IV, sodium chloride, traZODone   Assessment/Plan:   1. 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 - Echo 12/17/20: EF 45-50% severe LVH RV mod HK - PYP 1/23 markedly positive for TTR - Volume overload on admit. BNP 633.   - Diuresing well. Weight down 13 pounds. Has R>L HF. Volume status much better. Will give one more dose IV lasix and likely home tomorrow. - Scr improved 1.7 -> 1.4 - Continue IV lasix - Continue tafamidis.  - Continue midodrine 5 mg tid.  - No room for BB with bradycardia.  - Continue spiro 12.5 mg daily  -  continue jardiance 10 mg daily    2. PAF - Remains in SR  today  -On xarelto. No bleeding   3. OSA -Continue CPAP nightly    4. CKD Stage IIIb -Creatinine baseline 1.7-2.0 -Creatinine 1.4 today.  - Follow with diuresis   5. DMII On SSI    Length of Stay: 2   Glori Bickers MD 12/19/2021, 10:22 AM  Advanced Heart Failure Team Pager [phone] (M-F; [place])  Please contact [facility] for night-coverage after hours (4p -7a ) and weekends on [email or web address]

## 2021-12-19 NOTE — Progress Notes (Signed)
CPAP set up at bedside. Placed mask for fit. Patient states he is able to place himself on off as needed. Aware to call for assistance if needed. Machine set on auto (max 20, min 4)

## 2021-12-20 ENCOUNTER — Other Ambulatory Visit (HOSPITAL_COMMUNITY): Payer: Self-pay

## 2021-12-20 ENCOUNTER — Inpatient Hospital Stay (HOSPITAL_COMMUNITY): Payer: Medicare HMO

## 2021-12-20 DIAGNOSIS — I5031 Acute diastolic (congestive) heart failure: Secondary | ICD-10-CM

## 2021-12-20 DIAGNOSIS — I43 Cardiomyopathy in diseases classified elsewhere: Secondary | ICD-10-CM

## 2021-12-20 DIAGNOSIS — E854 Organ-limited amyloidosis: Secondary | ICD-10-CM

## 2021-12-20 DIAGNOSIS — R0602 Shortness of breath: Secondary | ICD-10-CM

## 2021-12-20 DIAGNOSIS — R072 Precordial pain: Secondary | ICD-10-CM

## 2021-12-20 LAB — CBC WITH DIFFERENTIAL/PLATELET
Abs Immature Granulocytes: 0.01 10*3/uL (ref 0.00–0.07)
Basophils Absolute: 0.1 10*3/uL (ref 0.0–0.1)
Basophils Relative: 2 %
Eosinophils Absolute: 0.2 10*3/uL (ref 0.0–0.5)
Eosinophils Relative: 4 %
HCT: 42.4 % (ref 39.0–52.0)
Hemoglobin: 14.2 g/dL (ref 13.0–17.0)
Immature Granulocytes: 0 %
Lymphocytes Relative: 42 %
Lymphs Abs: 1.9 10*3/uL (ref 0.7–4.0)
MCH: 28.2 pg (ref 26.0–34.0)
MCHC: 33.5 g/dL (ref 30.0–36.0)
MCV: 84.3 fL (ref 80.0–100.0)
Monocytes Absolute: 0.4 10*3/uL (ref 0.1–1.0)
Monocytes Relative: 9 %
Neutro Abs: 2 10*3/uL (ref 1.7–7.7)
Neutrophils Relative %: 43 %
Platelets: 278 10*3/uL (ref 150–400)
RBC: 5.03 MIL/uL (ref 4.22–5.81)
RDW: 15.4 % (ref 11.5–15.5)
WBC: 4.6 10*3/uL (ref 4.0–10.5)
nRBC: 0 % (ref 0.0–0.2)

## 2021-12-20 LAB — COMPREHENSIVE METABOLIC PANEL
ALT: 10 U/L (ref 0–44)
AST: 35 U/L (ref 15–41)
Albumin: 4.1 g/dL (ref 3.5–5.0)
Alkaline Phosphatase: 100 U/L (ref 38–126)
Anion gap: 10 (ref 5–15)
BUN: 41 mg/dL — ABNORMAL HIGH (ref 8–23)
CO2: 26 mmol/L (ref 22–32)
Calcium: 9.9 mg/dL (ref 8.9–10.3)
Chloride: 98 mmol/L (ref 98–111)
Creatinine, Ser: 1.4 mg/dL — ABNORMAL HIGH (ref 0.61–1.24)
GFR, Estimated: 55 mL/min — ABNORMAL LOW (ref >60)
Glucose, Bld: 86 mg/dL (ref 70–99)
Potassium: 4.3 mmol/L (ref 3.5–5.1)
Sodium: 134 mmol/L — ABNORMAL LOW (ref 135–145)
Total Bilirubin: 1.3 mg/dL — ABNORMAL HIGH (ref 0.3–1.2)
Total Protein: 8.2 g/dL — ABNORMAL HIGH (ref 6.5–8.1)

## 2021-12-20 LAB — MAGNESIUM: Magnesium: 2.4 mg/dL (ref 1.7–2.4)

## 2021-12-20 LAB — GLUCOSE, CAPILLARY
Glucose-Capillary: 102 mg/dL — ABNORMAL HIGH (ref 70–99)
Glucose-Capillary: 96 mg/dL (ref 70–99)

## 2021-12-20 LAB — PHOSPHORUS: Phosphorus: 4.7 mg/dL — ABNORMAL HIGH (ref 2.5–4.6)

## 2021-12-20 MED ORDER — ONDANSETRON HCL 4 MG PO TABS
4.0000 mg | ORAL_TABLET | Freq: Four times a day (QID) | ORAL | 0 refills | Status: DC | PRN
  Filled 2021-12-20: qty 20, 5d supply, fill #0

## 2021-12-20 MED ORDER — ENSURE MAX PROTEIN PO LIQD
11.0000 [oz_av] | Freq: Every day | ORAL | 0 refills | Status: DC
  Filled 2021-12-20: qty 3330, 10d supply, fill #0

## 2021-12-20 MED ORDER — ACETAMINOPHEN 325 MG PO TABS
650.0000 mg | ORAL_TABLET | Freq: Four times a day (QID) | ORAL | 0 refills | Status: DC | PRN
  Filled 2021-12-20: qty 20, 3d supply, fill #0

## 2021-12-20 MED ORDER — TORSEMIDE 20 MG PO TABS: 20.0000 mg | ORAL_TABLET | Freq: Every day | ORAL | Status: DC

## 2021-12-20 MED ORDER — TORSEMIDE 20 MG PO TABS
40.0000 mg | ORAL_TABLET | Freq: Every day | ORAL | 0 refills | Status: DC
  Filled 2021-12-20: qty 60, 30d supply, fill #0

## 2021-12-20 MED ORDER — GLUCERNA SHAKE PO LIQD
237.0000 mL | Freq: Two times a day (BID) | ORAL | 0 refills | Status: DC
  Filled 2021-12-20: qty 2370, 5d supply, fill #0

## 2021-12-20 MED ORDER — TORSEMIDE 20 MG PO TABS
40.0000 mg | ORAL_TABLET | Freq: Every day | ORAL | Status: DC
Start: 2021-12-20 — End: 2021-12-20
  Administered 2021-12-20: 40 mg via ORAL
  Filled 2021-12-20: qty 2

## 2021-12-20 MED ORDER — CERTAVITE/ANTIOXIDANTS PO TABS
1.0000 | ORAL_TABLET | Freq: Every day | ORAL | 0 refills | Status: DC
  Filled 2021-12-20: qty 30, 30d supply, fill #0

## 2021-12-20 NOTE — Progress Notes (Signed)
°  SATURATION QUALIFICATIONS: (This note is used to comply with regulatory documentation for home oxygen)  Patient Saturations on Room Air at Rest = 96%  Patient Saturations on Room Air while Ambulating = 98%  Patient Saturations on N/A Liters of oxygen while Ambulating = N/A%  Please briefly explain why patient needs home oxygen: N/A  [facility] Phone: [phone] Office Phone: [phone]

## 2021-12-20 NOTE — Progress Notes (Signed)
Mobility Specialist Progress Note:   12/20/21 1120  Mobility  Activity Ambulated with assistance in hallway  Level of Assistance Independent  Assistive Device None  Distance Ambulated (ft) 300 ft  Activity Response Tolerated well  $Mobility charge 1 Mobility   Pt requiring no physical assistance or supplemental oxygen. Ambulatory sat note to follow.  [facility] Phone: [phone] Office Phone: [phone]

## 2021-12-20 NOTE — Progress Notes (Addendum)
Advanced Heart Failure Rounding Note   Subjective:    Continues to diurese  well. Additional 2.7L in UOP yesterday. Overall, net negative 6.6L. Wt down 14 lb.   Scr improved and stable, 1.7>>1.4>>1.4 today   He feels well. Ambulated the unit last night before bed. No exertional dyspnea. Denies resting dyspnea. No complaints.    Objective:   Weight Range:  Vital Signs:   Temp:  [97.4 F (36.3 C)-97.9 F (36.6 C)] 97.8 F (36.6 C) (02/27 0441) Pulse Rate:  [60-62] 62 (02/26 2025) Resp:  [17-18] 18 (02/27 0441) BP: (101-104)/(79-80) 104/79 (02/26 2025) SpO2:  [97 %-100 %] 100 % (02/26 2025) Weight:  [77.7 kg] 77.7 kg (02/27 0441) Last BM Date : 12/16/21  Weight change: Filed Weights   12/18/21 0548 12/19/21 0517 12/20/21 0441  Weight: 79.4 kg 78.4 kg 77.7 kg    Intake/Output:   Intake/Output Summary (Last 24 hours) at 12/20/2021 0817 Last data filed at 12/20/2021 0441 Gross per 24 hour  Intake 957 ml  Output 2700 ml  Net -1743 ml     PHYSICAL EXAM: General:  Well appearing. No respiratory difficulty HEENT: normal Neck: supple. JVD not elevated. Carotids 2+ bilat; no bruits. No lymphadenopathy or thyromegaly appreciated. Cor: PMI nondisplaced. Regular rate & rhythm. No rubs, gallops or murmurs. Lungs: clear Abdomen: soft, nontender, nondistended. No hepatosplenomegaly. No bruits or masses. Good bowel sounds. Extremities: no cyanosis, clubbing, rash, edema Neuro: alert & oriented x 3, cranial nerves grossly intact. moves all 4 extremities w/o difficulty. Affect pleasant.    Telemetry: Sinus 60s, 9 beat run NSVT Personally reviewed   Labs: Basic Metabolic Panel: Recent Labs  Lab 12/16/21 2022 12/18/21 0156 12/19/21 0257  NA 136 133* 133*  K 4.5 3.7 3.8  CL 103 98 98  CO2 22 20* 22  GLUCOSE 119* 92 94  BUN 34* 39* 42*  CREATININE 1.69* 1.73* 1.40*  CALCIUM 9.5 9.4 9.6  MG  --  2.0 2.1  PHOS  --  4.9* 4.8*    Liver Function Tests: Recent Labs   Lab 12/18/21 0156 12/19/21 0257  AST 31 30  ALT 8 9  ALKPHOS 87 89  BILITOT 1.4* 1.2  PROT 7.3 7.8  ALBUMIN 3.8 3.8   No results for input(s): LIPASE, AMYLASE in the last 168 hours. No results for input(s): AMMONIA in the last 168 hours.  CBC: Recent Labs  Lab 12/16/21 2022 12/18/21 0156 12/19/21 0257 12/20/21 0658  WBC 4.8 5.2 4.6 4.6  NEUTROABS 2.5 2.5 2.1 2.0  HGB 14.3 13.8 14.3 14.2  HCT 44.1 40.0 40.6 42.4  MCV 87.8 83.7 82.5 84.3  PLT 288 255 267 278    Cardiac Enzymes: No results for input(s): CKTOTAL, CKMB, CKMBINDEX, TROPONINI in the last 168 hours.  BNP: BNP (last 3 results) Recent Labs    09/27/21 0115 11/04/21 1506 12/16/21 2023  BNP 431.9* 337.1* 633.8*    ProBNP (last 3 results) Recent Labs    02/09/21 1203 05/25/21 1345 07/06/21 0942  PROBNP 792* 1,236* 2,052*      Other results:  Imaging: DG CHEST PORT 1 VIEW  Result Date: 12/19/2021 CLINICAL DATA:  Shortness of breath EXAM: PORTABLE CHEST 1 VIEW COMPARISON:  12/18/2021 FINDINGS: Transverse diameter of heart is increased. Low position of diaphragms suggests possible COPD. There is evidence of previous placement of prosthetic aortic valve. Blunting of right lateral CP angle has not changed, possibly suggesting pleural thickening. There is interval clearing of linear densities in the  medial left lower lung fields suggesting resolution of subsegmental atelectasis. There are no new infiltrates or signs of pulmonary edema. There is no pneumothorax. IMPRESSION: Cardiomegaly. There are no signs of pulmonary edema or focal pulmonary consolidation. Electronically Signed   By: Elmer Picker M.D.   On: 12/19/2021 09:29     Medications:     Scheduled Medications:  allopurinol  300 mg Oral Daily   empagliflozin  10 mg Oral QAC breakfast   feeding supplement (GLUCERNA SHAKE)  237 mL Oral BID BM   furosemide  20 mg Intravenous BID   magnesium oxide  400 mg Oral Daily   midodrine  5 mg Oral  TID WC   multivitamin with minerals  1 tablet Oral Daily   Ensure Max Protein  11 oz Oral QHS   rivaroxaban  20 mg Oral QPM   rosuvastatin  5 mg Oral QPM   spironolactone  12.5 mg Oral Daily   Tafamidis  61 mg Oral Daily    Infusions:   PRN Medications: acetaminophen **OR** acetaminophen, colchicine, loratadine, magnesium hydroxide, ondansetron **OR** ondansetron (ZOFRAN) IV, sodium chloride, traZODone   Assessment/Plan:   1. 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 - Echo 12/17/20: EF 45-50% severe LVH RV mod HK - PYP 1/23 markedly positive for TTR - Volume overload on admit. BNP 633.   - Diuresing well. Weight down 14 pounds. Has R>L HF. Volume status much better.  - Scr 1.7 -> 1.4 -> 1.4 - Transition to PO diuretics, torsemide 20 mg daily  - Continue tafamidis.  - Continue midodrine 5 mg tid.  - No room for BB with bradycardia.  - Continue spiro 12.5 mg daily  - Continue jardiance 10 mg daily    2. PAF - Remains in SR today  - On xarelto. No bleeding   3. OSA -Continue CPAP nightly    4. CKD Stage IIIb -Creatinine baseline 1.7-2.0 -Creatinine 1.4 today  -Needs f/u BMP at post hospital f/u    5. DMII - On SSI - On Jardiance   Stable for d/c from HF standpoint. We will arrange post hospital f/u in the [facility] and will place appt info in AVS.   Cardiac Meds for Discharge Jardiance 10 mg daily  Midodrine 5 mg tid Xarelto 20 mg daily  Crestor 5 mg daily  Spironolactone 12.5 mg daily  Tafamidis 61 mg daily  Torsemide 40 mg daily   Needs BMP at [facility] f/u.    Length of Stay: 3   Brittainy Ladoris Gene  12/20/2021, 8:17 AM  Advanced Heart Failure Team Pager [phone] (M-F; 7a - 4p)  Please contact [facility] for  night-coverage after hours (4p -7a ) and weekends on [email or web address]   Patient seen and examined with the above-signed Advanced Practice Provider and/or Housestaff. I personally reviewed laboratory data, imaging studies and relevant notes. I independently examined the patient and formulated the important aspects of the plan. I have edited the note to reflect any of my changes or salient points. I have personally discussed the plan with the patient and/or family.  Continues to diurese. Down almost 15 pounds. Feels much better. Scr improved. BP stable.   General:  Sitting up in bed  No resp difficulty HEENT: normal Neck: supple.CVP 5-6. Carotids  2+ bilat; no bruits. No lymphadenopathy or thryomegaly appreciated. Cor: PMI nondisplaced. Regular rate & rhythm. 2/6 TR Lungs: clear Abdomen: soft, nontender, nondistended. No hepatosplenomegaly. No bruits or masses. Good bowel sounds. Extremities: no cyanosis, clubbing, rash, edema Neuro: alert & orientedx3, cranial nerves grossly intact. moves all 4 extremities w/o difficulty. Affect pleasant  He is much improved. OK for d/c home today. Agree with meds as above. Will follow closely in clinic.   Continue tafamadis. Would add vutrisiran soon.   Glori Bickers, MD  10:20 AM

## 2021-12-20 NOTE — Progress Notes (Signed)
Pt has orders to be discharged. Discharge instructions given and pt has no additional questions at this time. Medication regimen reviewed and pt educated. Pt verbalized understanding and has no additional questions. Telemetry box removed. IV removed and site in good condition. Pt stable and waiting for transportation. 

## 2021-12-20 NOTE — Discharge Summary (Signed)
Physician Discharge Summary   Patient: Frank Berg MRN: [id number] DOB: 04/03/1956  Admit date:     12/16/2021  Discharge date: 12/20/21  Discharge Physician: Raiford Noble, DO   PCP: [name], MD   Recommendations at discharge:   Follow-up with PCP within 1 to 2 weeks and repeat CBC, CMP, mag, Phos Follow-up with cardiology within 1 to 2 weeks and continue medications as delineated by cardiology including his torsemide 40 g p.o. daily and will need a BMP at ambulatory heart failure clinic follow-up Repeat chest x-ray in outpatient setting if necessary  Discharge Diagnoses: Principal Problem:   Acute CHF (congestive heart failure) ([place]) Active Problems:   Acute CHF ([place])  Resolved Problems:   * No resolved hospital problems. [name] Course: The Patient is a 65 year old thin African-American male with a past medical history significant for but not limited to atrial fibrillation on anticoagulant with Xarelto, CAD, type 2 diabetes mellitus with peripheral neuropathy, GERD, gout, dyslipidemia, hypertension, obstructive sleep apnea on CPAP, history of chronic systolic and diastolic CHF, history of transthyretin amyloidosis as well as other comorbidities who presents with cute onset of intermittent dyspnea in the last few weeks which is progressively gotten worse with coughing and wheezing.  He was admitted and expressed that he had orthopnea sleeping and had to sleep on an incline but does not have any extremity edema.  He also has PND and denies any nausea or vomiting.  He came to the ED and admitted to having some constipation.  While in the ED his heart respiratory rate and had a pulse oximetry of 100% and had to be placed on 2 L supplemental oxygen.  BNP was elevated at 633.8 higher than his prior.  High-sensitivity troponin was 86 and CBC was within normal limits.  EKG showed showed sinus bradycardia with first-degree AV block.  Chest x-ray showed cardiomegaly with no signs of  pulm edema or infiltrates.  In the ED he was given IV Lasix and cardiology was consulted for further evaluation recommendations and he had an echocardiogram repeated.  Cardiology is recommending diuresis and continuing spironolactone and Jardiance.  He was given IV Lasix 60 mg and his Reds clip was 35%.  Cardiology planning on switching to oral diuresis in the morning and patient is in sinus rhythm today but is bradycardic.  Continuing anticoagulation with Xarelto.  Patient will need an ambulatory home O2 screen prior to discharge  Cardiology continuing to follow and appreciate further Recc's. He states he is feeling better with diuresis. Repeat ECHO done and showed EF of 45-5% and Grade 3 DD with Severe Left Atrial and Right Atrial Dilation and biventricular Hypertrophy.   Cardiology evaluated today and felt that his volume status was much better and acceptable for discharge.  They recommended transitioning to p.o. diuretics and discharged home on torsemide 40 mg p.o. daily.  He will need to follow-up with PCP and cardiology in outpatient setting given that he is medically stable for discharge  Assessment and Plan: No notes have been filed under this hospital service. Service: Hospitalist  Acute on chronic systolic and diastolic CHF with subsequent acute hypoxic respiratory failure in the setting of TTR Cardiac Amyloidosis .   -The patient had a 2D echo on 02/12/2021 revealing an EF of 45% and grade 2 diastolic dysfunction with mild to moderate left atrial dilatation and moderate right atrial dilatation with mild mitral regurgitation.  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 - The patient will be  admitted to a cardiac telemetry bed. -BNP was elevated at 633.8 - Repeated ECHO this visit and showed EF of 45-50% with Grade 3 DD - We will continue diuresis with IV Lasix given borderline blood pressure. Card's Managing and reduced the dose to 20 mg IV BID the day before yesterday yesterday and  he was continued on IV Lasix yesterday and transition to p.o. torsemide 40 mg p.o. daily today - We will monitor I's and O's and daily weights. Patient is -6.938 Liters since admission and weight is down 14 lbs -SpO2: 98 % O2 Flow Rate (L/min): 2 L/min; Weaned off of Supplemental O2 via Combine -Continue Midodrine 5 mg po TID -Continuous Pulse Oximetry and Maintain O2 Sats >90% -Continue Supplemental O2 via [place] and wean O2 as tolerated -Repeat CXR done and showed "Cardiomegaly. There are no signs of pulmonary edema or focal pulmonary consolidation."  - We will continue Jardiance and Aldactone. - We will continue Xarelto. -Will do an Ambulatory Home O2 Screen in the AM and patient did not desaturate -Patient is stable for discharge home and follow-up with the heart failure clinic in outpatient setting  TTR Amyloidosis. - Continue Tafamidis 61 mg po Daily and cardiology would add Vutrisiran soon  Paroxysmal Atrial Fibrillation. -Currently in SNR -We will continue Rivaroxaban 20 mg po Daily. -Not on a BB due to his history of bradycardia  Dyslipidemia. -We will continue statin therapy with Rosuvastatin 5 mg po Daily.  Type 2 Diabetes Mellitus. - The patient will be placed on supplement coverage with NovoLog and will hold off metformin. -CBG's ranging from 92-146  Obstructive sleep apnea. - We will resume his CPAP nightly.   CKD Stage 3b Metabolic Acidosis Hyperphosphatemia  -Patient's BUN/Cr has gone from 34/1.69 -> 39/1.73 -> 42/1.40 -> 41/1.40 and stable  -Continue with diuresis as above and will avoid further nephrotoxic medications, contrast dyes, hypotension and renally adjust medications -Repeat CMP in the outpatient setting   Hyperbilirubinemia -Patient's T Bili is now 1.4 -> 1.1 -> 1.3 -Continue to Monitor and Trend and repeat CMP in the outpt setting    Hyponatremia -Mild in the setting of Diuresis -Patient's Na+ has gone from 136 -> 133 x2 and is now 134  -Continue to  Monitor and Trend -Repeat CMP in the outpatient setting   Nutrition Documentation    Flowsheet Row ED to Hosp-Admission (Discharged) from 12/16/2021 in [place] HF PCU  Nutrition Problem Increased nutrient needs  Etiology chronic illness  [CHF]  Nutrition Goal Patient will meet greater than or equal to 90% of their needs  Interventions MVI, Ensure Enlive (each supplement provides 350kcal and 20 grams of protein), Liberalize Diet      Pain control - [place] Controlled Substance Reporting System database was reviewed. and patient was instructed, not to drive, operate heavy machinery, perform activities at heights, swimming or participation in water activities or provide baby-sitting services while on Pain, Sleep and Anxiety Medications; until their outpatient Physician has advised to do so again. Also recommended to not to take more than prescribed Pain, Sleep and Anxiety Medications.   Consultants: Cardiology Heart Failure Team Procedures performed: Repeat ECHO  Disposition: Home Diet recommendation:  Discharge Diet Orders (From admission, onward)     Start     Ordered   12/20/21 0000  Diet - low sodium heart healthy        12/20/21 1221           Cardiac diet  DISCHARGE MEDICATION: Allergies  as of 12/20/2021       Reactions   Lipitor [atorvastatin] Other (See Comments)   Body aches   Penicillins Swelling   Swollen face   Penicillin G    Other reaction(s): Unknown        Medication List     STOP taking these medications    azithromycin 500 MG tablet Commonly known as: ZITHROMAX   furosemide 40 MG tablet Commonly known as: LASIX   triamcinolone 0.025 % ointment Commonly known as: KENALOG       TAKE these medications    acetaminophen 325 MG tablet Commonly known as: TYLENOL Take 2 tablets (650 mg total) by mouth every 6 (six) hours as needed for mild pain (or Fever >/= 101).   allopurinol 300 MG tablet Commonly known as:  ZYLOPRIM Take 300 mg by mouth daily.   CertaVite/Antioxidants Tabs Take 1 tablet by mouth daily. Start taking on: December 21, 2021   clotrimazole-betamethasone lotion Commonly known as: LOTRISONE Apply 1 application topically as needed (dry skin).   colchicine 0.6 MG tablet Take 0.6 mg by mouth as needed (gout).   empagliflozin 10 MG Tabs tablet Commonly known as: Jardiance Take 1 tablet (10 mg total) by mouth daily before breakfast.   feeding supplement (GLUCERNA SHAKE) Liqd Take 237 mLs by mouth 2 (two) times daily between meals.   Ensure Max Protein Liqd Take 330 mLs (11 oz total) by mouth at bedtime.   loratadine 10 MG tablet Commonly known as: CLARITIN Take 10 mg by mouth daily as needed for allergies.   magnesium oxide 400 MG tablet Commonly known as: MAG-OX Take 400 mg by mouth daily.   metFORMIN 500 MG tablet Commonly known as: GLUCOPHAGE Take 1 tablet (500 mg total) by mouth 2 (two) times daily with a meal.   midodrine 5 MG tablet Commonly known as: PROAMATINE Take 1 tablet (5 mg total) by mouth 3 (three) times daily with meals.   ondansetron 4 MG tablet Commonly known as: ZOFRAN Take 1 tablet (4 mg total) by mouth every 6 (six) hours as needed for nausea.   OneTouch Verio test strip Generic drug: glucose blood TEST ONCE A DAY DX E11.29   rosuvastatin 5 MG tablet Commonly known as: CRESTOR Take 5 mg by mouth every evening.   spironolactone 25 MG tablet Commonly known as: ALDACTONE Take 0.5 tablets (12.5 mg total) by mouth daily.   tadalafil (PAH) 20 MG tablet Commonly known as: ADCIRCA Take 20 mg by mouth daily as needed (ED).   torsemide 20 MG tablet Commonly known as: DEMADEX Take 2 tablets (40 mg total) by mouth daily. Start taking on: December 21, 2021   Vyndamax 61 MG Caps Generic drug: Tafamidis Take 1 capsule by mouth daily.   Xarelto 20 MG Tabs tablet Generic drug: rivaroxaban Take 1 tablet (20 mg total) by mouth every  evening.        Follow-up Information     Bensimhon, Shaune Pascal, MD Follow up.   Specialty: Cardiology Why: 12/22/21 at 11:00 AM [facility] information: [place] [address] [phone]         [name], MD Follow up.   Specialty: Endocrinology Why: The office will call patient. Contact information: [place] [address] [phone]         Jettie Booze, MD .   Specialties: Cardiology, Radiology, Interventional Cardiology Contact information: [address]  [place] [address] [phone]         [name], MD .   Specialty: Cardiology Contact information: [address] [place] [address] [phone]                Discharge Exam: Danley Danker Weights   12/18/21 0548 12/19/21 0517 12/20/21 0441  Weight: 79.4 kg 78.4 kg 77.7 kg   Vitals:   12/20/21 0441 12/20/21 0910  BP:  103/76  Pulse:    Resp: 18 18  Temp: 97.8 F (36.6 C) 97.6 F (36.4 C)  SpO2:  98%   Examination: Physical Exam:  Constitutional: Thin chronically ill-appearing African-American male currently no acute distress Respiratory: Diminished to auscultation bilaterally with coarse breath sounds, no wheezing, rales, rhonchi or crackles. Normal respiratory effort and patient is not tachypenic. No accessory muscle use.  Unlabored breathing and not wearing supplemental oxygen via nasal cannula Cardiovascular: RRR, no murmurs / rubs / gallops. S1 and S2 auscultated. No appreciable extremity edema noted Abdomen: Soft, non-tender, non-distended. Bowel sounds positive.  GU: Deferred.  Condition at discharge: stable  The results of significant diagnostics from this hospitalization (including imaging, microbiology, ancillary and laboratory) are listed below for reference.   Imaging Studies: DG Chest 2 View  Result Date: 12/16/2021 CLINICAL DATA:   Shortness of breath EXAM: CHEST - 2 VIEW COMPARISON:  Previous studies including the examination of 09/27/2021 FINDINGS: Transverse diameter of heart is increased. There is previous prosthetic aortic valve placement. There is previous surgical fusion in the lower cervical spine. There are no signs of pulmonary edema or focal pulmonary consolidation. Blunting of right lateral CP angle and pleural thickening in the lateral aspect of right lower lung fields have not changed significantly. Left lateral CP angle is clear. There is no pneumothorax. IMPRESSION: Cardiomegaly. There are no signs of pulmonary edema or new focal infiltrates. Other findings as described in the body of the report. Electronically Signed   By: Elmer Picker M.D.   On: 12/16/2021 20:52   DG CHEST PORT 1 VIEW  Result Date: 12/20/2021 CLINICAL DATA:  Shortness of breath, hypertension EXAM: PORTABLE CHEST 1 VIEW COMPARISON:  Chest radiograph 12/19/2021 FINDINGS: Median sternotomy wires and aortic valve prosthesis are stable. The cardiomediastinal silhouette is stable. There is no focal consolidation or pulmonary edema. Blunting of the right costophrenic angle is unchanged going back multiple prior studies. There is no significant pleural effusion. There is no pneumothorax. Overall, aeration is unchanged. The bones are stable. IMPRESSION: Stable chest with no radiographic evidence of acute cardiopulmonary process. Electronically Signed   By: Valetta Mole M.D.   On: 12/20/2021 08:24   DG CHEST PORT 1 VIEW  Result Date: 12/19/2021 CLINICAL DATA:  Shortness of breath EXAM: PORTABLE CHEST 1 VIEW COMPARISON:  12/18/2021 FINDINGS: Transverse diameter of heart is increased. Low position of diaphragms suggests possible COPD. There is evidence of previous placement of prosthetic aortic valve. Blunting of right lateral CP angle has not changed, possibly suggesting pleural thickening. There is interval clearing of linear densities in the medial left  lower lung fields suggesting resolution of subsegmental atelectasis. There are no new infiltrates or signs of pulmonary edema. There is no pneumothorax. IMPRESSION: Cardiomegaly. There are no signs of pulmonary edema or focal pulmonary consolidation. Electronically Signed   By: Elmer Picker M.D.   On: 12/19/2021 09:29   DG CHEST PORT 1 VIEW  Result Date: 12/18/2021 CLINICAL DATA:  Shortness of breath EXAM: PORTABLE CHEST 1 VIEW COMPARISON:  Previous  studies including the examination of 12/16/2021 FINDINGS: Transverse diameter of heart is increased. There are no signs of pulmonary edema. Small linear densities are seen in the medial left lower lung fields. There is blunting of right lateral CP angle. Left lateral CP angle is clear. There is no pneumothorax. There is previous placement of prosthetic aortic valve. There is surgical fusion in the lower cervical spine. IMPRESSION: Cardiomegaly. Small linear densities in the medial left lower lung fields may suggest subsegmental atelectasis. There are no signs of pulmonary edema or focal pulmonary consolidation. Blunting of right lateral CP angle may suggest minimal effusion or pleural thickening. Electronically Signed   By: Elmer Picker M.D.   On: 12/18/2021 09:14   ECHOCARDIOGRAM COMPLETE  Result Date: 12/18/2021    ECHOCARDIOGRAM REPORT   Patient Name:   Frank Berg Date of Exam: 12/17/2021 Medical Rec #:  [id number]         Height:       74.0 in Accession #:    [id number]        Weight:       185.0 lb Date of Birth:  1956-04-26          BSA:          2.103 m Patient Age:    23 years          BP:           107/86 mmHg Patient Gender: M                 HR:           68 bpm. Exam Location:  Inpatient Procedure: 2D Echo, 3D Echo, Cardiac Doppler and Color Doppler Indications:     CHF-Acute Systolic I33.82  History:         Patient has prior history of Echocardiogram examinations, most                  recent 02/12/2021. CAD, Prior CABG; Risk  Factors:Hypertension,                  Dyslipidemia, Diabetes and Sleep Apnea. GERD. Cardiac                  amyloidosis. Chronic kidney disease.                  Aortic Valve: Bovine valve is present in the aortic position.                  Procedure Date: 2011.  Sonographer:     Darlina Sicilian RDCS Referring Phys:  [id number] JAN A MANSY Diagnosing Phys: Glori Bickers MD  Sonographer Comments: Global longitudinal strain was attempted. IMPRESSIONS  1. Left ventricular ejection fraction, by estimation, is 45 to 50%. The left ventricle has mildly decreased function. The left ventricle has no regional wall motion abnormalities. There is severe left ventricular hypertrophy. Left ventricular diastolic parameters are consistent with Grade III diastolic dysfunction (restrictive).  2. Right ventricular systolic function is moderately reduced. The right ventricular size is severely enlarged. Severely increased right ventricular wall thickness. There is mildly elevated pulmonary artery systolic pressure.  3. Left atrial size was severely dilated.  4. Right atrial size was severely dilated.  5. The mitral valve is normal in structure. Mild mitral valve regurgitation. No evidence of mitral stenosis.  6. Tricuspid valve leaflets do not coapt due to dilated RV. Tricuspid valve regurgitation is severe.  7. The aortic valve is tricuspid. There  is moderate thickening of the aortic valve. Aortic valve regurgitation is trivial. Mild aortic valve stenosis. There is a Bovine valve present in the aortic position. Procedure Date: 2011.  8. Pulmonic valve regurgitation is moderate.  9. The inferior vena cava is dilated in size with <50% respiratory variability, suggesting right atrial pressure of 15 mmHg. 10. Echo with severe biventricular hypertrophy and thickened valves consitent with patient's known history of cardiac amyloidosis. FINDINGS  Left Ventricle: Left ventricular ejection fraction, by estimation, is 45 to 50%. The left  ventricle has mildly decreased function. The left ventricle has no regional wall motion abnormalities. The left ventricular internal cavity size was normal in size. There is severe left ventricular hypertrophy. Left ventricular diastolic parameters are consistent with Grade III diastolic dysfunction (restrictive). Right Ventricle: The right ventricular size is severely enlarged. Severely increased right ventricular wall thickness. Right ventricular systolic function is moderately reduced. There is mildly elevated pulmonary artery systolic pressure. The tricuspid regurgitant velocity is 2.58 m/s, and with an assumed right atrial pressure of 15 mmHg, the estimated right ventricular systolic pressure is 50.5 mmHg. Left Atrium: Left atrial size was severely dilated. Right Atrium: Right atrial size was severely dilated. Pericardium: There is no evidence of pericardial effusion. Mitral Valve: The mitral valve is normal in structure. There is moderate thickening of the mitral valve leaflet(s). Mild mitral valve regurgitation. No evidence of mitral valve stenosis. Tricuspid Valve: Tricuspid valve leaflets do not coapt due to dilated RV. The tricuspid valve is normal in structure. Tricuspid valve regurgitation is severe. No evidence of tricuspid stenosis. Aortic Valve: The aortic valve is tricuspid. There is moderate thickening of the aortic valve. Aortic valve regurgitation is trivial. Mild aortic stenosis is present. Aortic valve mean gradient measures 10.8 mmHg. Aortic valve peak gradient measures 19.0  mmHg. Aortic valve area, by VTI measures 2.13 cm. There is a Bovine valve present in the aortic position. Procedure Date: 2011. Pulmonic Valve: The pulmonic valve was normal in structure. Pulmonic valve regurgitation is moderate. No evidence of pulmonic stenosis. Aorta: The aortic root is normal in size and structure. Venous: The inferior vena cava is dilated in size with less than 50% respiratory variability, suggesting  right atrial pressure of 15 mmHg. IAS/Shunts: No atrial level shunt detected by color flow Doppler.  LEFT VENTRICLE PLAX 2D LVIDd:         4.90 cm   Diastology LVIDs:         3.60 cm   LV e' medial:    3.37 cm/s LV PW:         1.80 cm   LV E/e' medial:  17.8 LV IVS:        1.90 cm   LV e' lateral:   4.40 cm/s LVOT diam:     2.70 cm   LV E/e' lateral: 13.6 LV SV:         80 LV SV Index:   38 LVOT Area:     5.73 cm                           3D Volume EF:                          3D EF:        41 %  LV EDV:       137 ml                          LV ESV:       81 ml                          LV SV:        56 ml RIGHT VENTRICLE RV S prime:     6.80 cm/s TAPSE (M-mode): 1.1 cm LEFT ATRIUM             Index        RIGHT ATRIUM           Index LA diam:        4.00 cm 1.90 cm/m   RA Area:     23.80 cm LA Vol (A2C):   67.7 ml 32.20 ml/m  RA Volume:   74.20 ml  35.29 ml/m LA Vol (A4C):   51.5 ml 24.49 ml/m LA Biplane Vol: 63.1 ml 30.01 ml/m  AORTIC VALVE AV Area (Vmax):    2.05 cm AV Area (Vmean):   1.92 cm AV Area (VTI):     2.13 cm AV Vmax:           218.00 cm/s AV Vmean:          151.000 cm/s AV VTI:            0.377 m AV Peak Grad:      19.0 mmHg AV Mean Grad:      10.8 mmHg LVOT Vmax:         77.90 cm/s LVOT Vmean:        50.600 cm/s LVOT VTI:          0.140 m LVOT/AV VTI ratio: 0.37  AORTA Ao Root diam: 3.70 cm Ao Asc diam:  3.80 cm MITRAL VALVE               TRICUSPID VALVE MV Area (PHT): 3.11 cm    TR Peak grad:   26.6 mmHg MV Decel Time: 244 msec    TR Vmax:        258.00 cm/s MV E velocity: 60.00 cm/s MV A velocity: 46.20 cm/s  SHUNTS MV E/A ratio:  1.30        Systemic VTI:  0.14 m                            Systemic Diam: 2.70 cm Glori Bickers MD Electronically signed by Glori Bickers MD Signature Date/Time: 12/17/2021/4:22:01 PM    Final (Updated)     Microbiology: Results for orders placed or performed during the hospital encounter of 12/16/21  Resp Panel by RT-PCR (Flu  A&B, Covid) Nasopharyngeal Swab     Status: None   Collection Time: 12/16/21  8:17 PM   Specimen: Nasopharyngeal Swab; Nasopharyngeal(NP) swabs in vial transport medium  Result Value Ref Range Status   SARS Coronavirus 2 by RT PCR NEGATIVE NEGATIVE Final    Comment: (NOTE) SARS-CoV-2 target nucleic acids are NOT DETECTED.  The SARS-CoV-2 RNA is generally detectable in upper respiratory specimens during the acute phase of infection. The lowest concentration of SARS-CoV-2 viral copies this assay can detect is 138 copies/mL. A negative result does not preclude SARS-Cov-2 infection and should not be used as the sole basis for treatment or other patient management decisions. A negative  result may occur with  improper specimen collection/handling, submission of specimen other than nasopharyngeal swab, presence of viral mutation(s) within the areas targeted by this assay, and inadequate number of viral copies(<138 copies/mL). A negative result must be combined with clinical observations, patient history, and epidemiological information. The expected result is Negative.  Fact Sheet for Patients:  EntrepreneurPulse.com.au  Fact Sheet for Healthcare Providers:  IncredibleEmployment.be  This test is no t yet approved or cleared by the [place] FDA and  has been authorized for detection and/or diagnosis of SARS-CoV-2 by FDA under an Emergency Use Authorization (EUA). This EUA will remain  in effect (meaning this test can be used) for the duration of the COVID-19 declaration under Section 564(b)(1) of the Act, 21 U.S.C.section 360bbb-3(b)(1), unless the authorization is terminated  or revoked sooner.       Influenza A by PCR NEGATIVE NEGATIVE Final   Influenza B by PCR NEGATIVE NEGATIVE Final    Comment: (NOTE) The Xpert Xpress SARS-CoV-2/FLU/RSV plus assay is intended as an aid in the diagnosis of influenza from Nasopharyngeal swab specimens  and should not be used as a sole basis for treatment. Nasal washings and aspirates are unacceptable for Xpert Xpress SARS-CoV-2/FLU/RSV testing.  Fact Sheet for Patients: EntrepreneurPulse.com.au  Fact Sheet for Healthcare Providers: IncredibleEmployment.be  This test is not yet approved or cleared by the [place] FDA and has been authorized for detection and/or diagnosis of SARS-CoV-2 by FDA under an Emergency Use Authorization (EUA). This EUA will remain in effect (meaning this test can be used) for the duration of the COVID-19 declaration under Section 564(b)(1) of the Act, 21 U.S.C. section 360bbb-3(b)(1), unless the authorization is terminated or revoked.  Performed at [facility], [place] [address], [place], [place] [address]     Labs: CBC: Recent Labs  Lab 12/16/21 2022 12/18/21 0156 12/19/21 0257 12/20/21 0658  WBC 4.8 5.2 4.6 4.6  NEUTROABS 2.5 2.5 2.1 2.0  HGB 14.3 13.8 14.3 14.2  HCT 44.1 40.0 40.6 42.4  MCV 87.8 83.7 82.5 84.3  PLT 288 255 267 254   Basic Metabolic Panel: Recent Labs  Lab 12/16/21 2022 12/18/21 0156 12/19/21 0257 12/20/21 0658  NA 136 133* 133* 134*  K 4.5 3.7 3.8 4.3  CL 103 98 98 98  CO2 22 20* 22 26  GLUCOSE 119* 92 94 86  BUN 34* 39* 42* 41*  CREATININE 1.69* 1.73* 1.40* 1.40*  CALCIUM 9.5 9.4 9.6 9.9  MG  --  2.0 2.1 2.4  PHOS  --  4.9* 4.8* 4.7*   Liver Function Tests: Recent Labs  Lab 12/18/21 0156 12/19/21 0257 12/20/21 0658  AST 31 30 35  ALT 8 9 10   ALKPHOS 87 89 100  BILITOT 1.4* 1.2 1.3*  PROT 7.3 7.8 8.2*  ALBUMIN 3.8 3.8 4.1   CBG: Recent Labs  Lab 12/19/21 1153 12/19/21 1613 12/19/21 2100 12/20/21 0557 12/20/21 1142  GLUCAP 114* 122* 110* 102* 96    Discharge time spent: greater than 30 minutes.  Signed: Raiford Noble, DO [facility] 12/20/2021

## 2021-12-20 NOTE — Progress Notes (Signed)
Assisted patient with CPAP placement. Mask on and appropriate fit. Will continue to monitor.

## 2021-12-20 NOTE — TOC Transition Note (Addendum)
Transition of Care [facility]) - CM/SW Discharge Note   Patient Details  Name: REINHOLD RICKEY MRN: [id number] Date of Birth: [birth year]  Transition of Care [facility]) CM/SW Contact:  Zenon Mayo, RN Phone Number: 12/20/2021, 12:46 PM   Clinical Narrative:    Patient is for dc today, he has no needs. TOC to fill meds         Patient Goals and CMS Choice        Discharge Placement                       Discharge Plan and Services                                     Social Determinants of Health (SDOH) Interventions     Readmission Risk Interventions No flowsheet data found.

## 2021-12-22 ENCOUNTER — Encounter (HOSPITAL_COMMUNITY): Payer: Self-pay | Admitting: Internal Medicine

## 2021-12-22 ENCOUNTER — Ambulatory Visit (HOSPITAL_COMMUNITY)
Admission: RE | Admit: 2021-12-22 | Discharge: 2021-12-22 | Disposition: A | Discharge status: Home / Self Care | Payer: Medicare HMO | Source: Ambulatory Visit | Attending: Internal Medicine | Admitting: Internal Medicine

## 2021-12-22 ENCOUNTER — Other Ambulatory Visit: Payer: Self-pay

## 2021-12-22 VITALS — BP 106/74 | HR 69 | Wt 177.2 lb

## 2021-12-22 DIAGNOSIS — G4733 Obstructive sleep apnea (adult) (pediatric): Secondary | ICD-10-CM | POA: Diagnosis not present

## 2021-12-22 DIAGNOSIS — I5022 Chronic systolic (congestive) heart failure: Secondary | ICD-10-CM | POA: Diagnosis not present

## 2021-12-22 DIAGNOSIS — E1122 Type 2 diabetes mellitus with diabetic chronic kidney disease: Secondary | ICD-10-CM | POA: Insufficient documentation

## 2021-12-22 DIAGNOSIS — I2510 Atherosclerotic heart disease of native coronary artery without angina pectoris: Secondary | ICD-10-CM | POA: Insufficient documentation

## 2021-12-22 DIAGNOSIS — Z952 Presence of prosthetic heart valve: Secondary | ICD-10-CM

## 2021-12-22 DIAGNOSIS — I480 Paroxysmal atrial fibrillation: Secondary | ICD-10-CM | POA: Diagnosis not present

## 2021-12-22 DIAGNOSIS — Z7901 Long term (current) use of anticoagulants: Secondary | ICD-10-CM | POA: Diagnosis not present

## 2021-12-22 DIAGNOSIS — I43 Cardiomyopathy in diseases classified elsewhere: Secondary | ICD-10-CM | POA: Diagnosis not present

## 2021-12-22 DIAGNOSIS — Z9989 Dependence on other enabling machines and devices: Secondary | ICD-10-CM | POA: Insufficient documentation

## 2021-12-22 DIAGNOSIS — I130 Hypertensive heart and chronic kidney disease with heart failure and stage 1 through stage 4 chronic kidney disease, or unspecified chronic kidney disease: Secondary | ICD-10-CM | POA: Diagnosis present

## 2021-12-22 DIAGNOSIS — N1831 Chronic kidney disease, stage 3a: Secondary | ICD-10-CM | POA: Diagnosis not present

## 2021-12-22 DIAGNOSIS — E1142 Type 2 diabetes mellitus with diabetic polyneuropathy: Secondary | ICD-10-CM | POA: Insufficient documentation

## 2021-12-22 DIAGNOSIS — Z951 Presence of aortocoronary bypass graft: Secondary | ICD-10-CM | POA: Insufficient documentation

## 2021-12-22 DIAGNOSIS — Z953 Presence of xenogenic heart valve: Secondary | ICD-10-CM | POA: Diagnosis not present

## 2021-12-22 DIAGNOSIS — I951 Orthostatic hypotension: Secondary | ICD-10-CM | POA: Insufficient documentation

## 2021-12-22 DIAGNOSIS — E854 Organ-limited amyloidosis: Secondary | ICD-10-CM

## 2021-12-22 DIAGNOSIS — Z7984 Long term (current) use of oral hypoglycemic drugs: Secondary | ICD-10-CM | POA: Insufficient documentation

## 2021-12-22 DIAGNOSIS — Z79899 Other long term (current) drug therapy: Secondary | ICD-10-CM | POA: Diagnosis not present

## 2021-12-22 LAB — BASIC METABOLIC PANEL
Anion gap: 10 (ref 5–15)
BUN: 43 mg/dL — ABNORMAL HIGH (ref 8–23)
CO2: 28 mmol/L (ref 22–32)
Calcium: 10 mg/dL (ref 8.9–10.3)
Chloride: 95 mmol/L — ABNORMAL LOW (ref 98–111)
Creatinine, Ser: 1.51 mg/dL — ABNORMAL HIGH (ref 0.61–1.24)
GFR, Estimated: 50 mL/min — ABNORMAL LOW (ref >60)
Glucose, Bld: 135 mg/dL — ABNORMAL HIGH (ref 70–99)
Potassium: 4.6 mmol/L (ref 3.5–5.1)
Sodium: 133 mmol/L — ABNORMAL LOW (ref 135–145)

## 2021-12-22 LAB — BRAIN NATRIURETIC PEPTIDE: B Natriuretic Peptide: 442.8 pg/mL — ABNORMAL HIGH (ref 0.0–100.0)

## 2021-12-22 NOTE — Addendum Note (Signed)
Encounter addended by: Jerl Mina, RN on: 12/22/2021 11:52 AM ? Actions taken: Order list changed, Diagnosis association updated, Charge Capture section accepted, Clinical Note Signed

## 2021-12-22 NOTE — Progress Notes (Signed)
ADVANCED HF CLINIC NOTE  Referring Physician: Larae Grooms, MD Primary Care: [name], MD Primary Cardiologist:Jayadeep Irish Lack, MD   HPI:  Frank Berg is a 66 y/o male referred by Dr. Irish Lack for further evaluation of his HF.   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 Also has h/o AF s/p AF ablation at [facility] in 2017. CAD s/p previous stent (2008) followed by LIMA to LAD at time of AVR.   Admitted in 4/22 with near syncope. EF 45% by echo. RV mildly HK. RVSP 4mHG   R/L cath 5/22: mLAD 90% (patent LIMA to LAD), LCX and RCA minimal CAD.  RA 9 RV 24/0 PA 25/8 (15) PCWP 5 Fick 3.4/1.6  We saw him for the first time on 08/23/21 for AHF consult. W/u revealed severe TTR cardiac amyloidosis. Started on tafamadis. CPX with marked HF limitation.   Admitted last week (2/23) with R>L HF. Diuresed 14 pounds. D/c weight 171 on Monday. Started on torsemide 40 but hasn't taken it yet  Here for post hospital f/u. Weight at home 169 . Has not taken torsemide yet at home. Feels ok. Able to do ADLs without too much problem. Denies edema, dizziness, orthopnea or PND.   Echo 12/17/21: EF 45-50% severe LVH RV mod HK   Cardiac studies:   CPX 09/07/21  FVC 3.06  (74%)      FEV1 2.30 (73%)        FEV1/FVC 75 (98%)        MVV 82 (58%)       BP rest: 94/64 Standing BP: 86/64 BP peak: 100/58  Peak VO2: 10.4 (36% predicted peak VO2)  VE/VCO2 slope:  48  OUES: 1.04  Peak RER: 1.09  Ventilatory Threshold: 9.3 (33% predicted or measured peak VO2)  O2pulse:  8   (46% predicted O2pulse  cMRI 09/22/21 1. Mild LVE with moderate LVH diffuse hypokinesis EF 40% 2. Markedly abnormal post gadolinium images with 41.8% myocardial uptake over all axial slices Although there is a subendocardial and mid myocardial predominance and worse uptake in the basal segments there is also transmural uptake in some areas not contained to a coronary  distribution 3. Degenerative appearing Bioprosthetic AVR with mild appearing AR and thickened leaflets 4.  Mild RVE/RV hypokinesis RVEF 46%  PVP Scan 11/03/20 Ratio 2.8 Markedly positive for TTR    Past Medical History:  Diagnosis Date   Atrial fibrillation (HCC)    Coronary artery disease    Diabetes mellitus without complication (HCC)    Diabetic polyneuropathy (HCC)    GERD (gastroesophageal reflux disease)    Gout, unspecified    HLD (hyperlipidemia)    Hx of adenomatous polyp of colon 12/03/2020   Hypertension    OSA on CPAP    Sleep apnea     Current Outpatient Medications  Medication Sig Dispense Refill   acetaminophen (TYLENOL) 325 MG tablet Take 2 tablets (650 mg total) by mouth every 6 (six) hours as needed for mild pain (or Fever >/= 101). 20 tablet 0   allopurinol (ZYLOPRIM) 300 MG tablet Take 300 mg by mouth daily.     colchicine 0.6 MG tablet Take 0.6 mg by mouth as needed (gout).     empagliflozin (JARDIANCE) 10 MG TABS tablet Take 1 tablet (10 mg total) by mouth daily before breakfast. 90 tablet 2   feeding supplement, GLUCERNA SHAKE, (GLUCERNA SHAKE) LIQD Take 237 mLs by mouth 2 (two) times daily  between meals. 2370 mL 0   glucose blood (ONETOUCH VERIO) test strip TEST ONCE A DAY DX E11.29     loratadine (CLARITIN) 10 MG tablet Take 10 mg by mouth daily as needed for allergies.     magnesium oxide (MAG-OX) 400 MG tablet Take 400 mg by mouth daily.     metFORMIN (GLUCOPHAGE) 500 MG tablet Take 1 tablet (500 mg total) by mouth 2 (two) times daily with a meal.     midodrine (PROAMATINE) 5 MG tablet Take 1 tablet (5 mg total) by mouth 3 (three) times daily with meals. 90 tablet 6   Multiple Vitamins-Minerals (CERTAVITE/ANTIOXIDANTS) TABS Take 1 tablet by mouth daily. 30 tablet 0   ondansetron (ZOFRAN) 4 MG tablet Take 1 tablet (4 mg total) by mouth every 6 (six) hours as needed for nausea. 20 tablet 0   rosuvastatin (CRESTOR) 5 MG tablet Take 5 mg by mouth every  evening.     spironolactone (ALDACTONE) 25 MG tablet Take 0.5 tablets (12.5 mg total) by mouth daily. 45 tablet 3   tadalafil, PAH, (ADCIRCA) 20 MG tablet Take 20 mg by mouth daily as needed (ED).     Tafamidis (VYNDAMAX) 61 MG CAPS Take 1 capsule by mouth daily. 90 capsule 3   torsemide (DEMADEX) 20 MG tablet Take 2 tablets (40 mg total) by mouth daily. 60 tablet 0   XARELTO 20 MG TABS tablet Take 1 tablet (20 mg total) by mouth every evening. 90 tablet 3   Ensure Max Protein (ENSURE MAX PROTEIN) LIQD Take 330 mLs (11 oz total) by mouth at bedtime. 3330 mL 0   No current facility-administered medications for this encounter.    Allergies  Allergen Reactions   Lipitor [Atorvastatin] Other (See Comments)    Body aches   Penicillins Swelling    Swollen face   Penicillin G     Other reaction(s): Unknown      Social History   Socioeconomic History   Marital status: Single    Spouse name: Not on file   Number of children: 2   Years of education: Not on file   Highest education level: Not on file  Occupational History   Occupation: retired  Tobacco Use   Smoking status: Former    Packs/day: 0.25    Years: 10.00    Pack years: 2.50    Types: Cigarettes    Quit date: 11/03/1981    Years since quitting: 40.1   Smokeless tobacco: Never  Vaping Use   Vaping Use: Never used  Substance and Sexual Activity   Alcohol use: Yes    Comment: occasional   Drug use: No   Sexual activity: Not on file  Other Topics Concern   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   Married 1 son 1 daughter   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   Other jobs including working at the [employer], providing health care for disabled, 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, 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    Social Determinants of Radio broadcast assistant Strain: Not on file  Food Insecurity: Not on file  Transportation Needs: Not on file  Physical Activity: Not on file  Stress: Not on file  Social Connections: Not on file  Intimate Partner Violence: Not on file      Family History  Problem Relation Age of Onset   Congenital heart disease Mother    Diabetes Mother    Hypertension Mother    Heart disease Mother    Heart attack Father    Cervical cancer Sister    Diabetes Sister    Hypertension Brother    Diabetes Brother    Pancreatic cancer Brother    Diabetes Brother    Breast cancer Sister    Diabetes Sister    Diabetes Sister    Diabetes Sister    Diabetes Sister    Diabetes Son    Colon cancer Neg Hx    Colon polyps Neg Hx    Esophageal cancer Neg Hx    Stomach cancer Neg Hx     Vitals:   12/22/21 1118  BP: 106/74  Pulse: 69  SpO2: 100%  Weight: 80.4 kg (177 lb 3.2 oz)    Wt Readings from Last 3 Encounters:  12/22/21 80.4 kg (177 lb 3.2 oz)  12/20/21 77.7 kg (171 lb 6.4 oz)  11/04/21 87 kg (191 lb 12.8 oz)    PHYSICAL EXAM: General:  Thin appearing. No resp difficulty HEENT: normal Neck: supple. JVP 8-9 Carotids 2+ bilat; no bruits. No lymphadenopathy or thryomegaly appreciated. Cor: PMI nondisplaced. Regular rate & rhythm. No rubs, gallops or murmurs. Lungs: clear Abdomen: soft, nontender, + distended. No hepatosplenomegaly. No bruits or masses. Good bowel sounds. Extremities: no cyanosis, clubbing, rash, edema Neuro: alert & orientedx3, cranial nerves grossly intact. moves all 4 extremities w/o difficulty. Affect pleasant    ASSESSMENT & PLAN:   1. 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 RVSP 63mHG with question of mild D-shaped septum - R/L cath 5/22: mLAD 90% (patent LIMA to LAD), LCX and RCA minimal CAD.  RA 9 RV 24/0 PA 25/8 (15) PCWP 5 Fick 3.4/1.6CPX 09/07/21 - CPX 11/22: pVO2 10.4 VE/VCO2  slope:  48, pRER: 1.09  - cMRI 09/22/21: EF 40% RVEF 46%  diffuses LGE and markedly elevated ECW ~71%. degenerative appearing Bioprosthetic AVR with mild AI. Reviewed with imaging team and felt to be c/w advanced cardiac amyloidosis. - PYP 1/22 markedly positive. Myeloma negative - Echo 12/17/21: EF 45-50% severe LVH RV mod HK - Admitted 2/23 with R>L HF. Diuresed 14 pounds - NYHA III-IIIB.CPX confirms severe HF limitation - Volume status increasing. Encouraged him to start his torsemide 40 daily  -> will need to follow closely for recurrent orthostasis or volume depletion - Off bisoprolol - Continue Jardiance 10 and low-dose spiro  - Continue midodrine 5 tid and compression hose for orthostasis - Check labs today and 1 week  - F/u NP 2-3 weeks - [facility] - Doubt candidate for advanced therapies with RV >LV failure and amyloid diagnosis  2. 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 Will need to see Neurology  3. CAD - s/p previous stent 2008 (unknown vessel) & LIMA to LAD 2011 - no s/s angina - continue statin. No ASA with Xarelto  4.  Bicuspid AoV s/p bioprosthetic AVR 2011 - stable on  echo - aware of SBE prophylaxis  5. PAF - s/p AF ablation 2017 - no recurrence.  -  followed by Dr. Rayann Heman - Continue Xarelto. No bleeding  6. OSA - compliant with CPAP. No change  7. 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 Orthostatic hypotension - Improved on midodrine  Glori Bickers, MD  11:27 AM

## 2021-12-22 NOTE — Patient Instructions (Signed)
Thank you for your visit today. ? ?Start taking your Torsemide. ? ?Labs done today, your results will be available in MyChart, we will contact you for abnormal readings. ? ?Your physician recommends that you schedule a follow-up appointment in: 3 weeks ? ?If you have any questions or concerns before your next appointment please send [place] a message through [place] or call our office at [phone].   ? ?TO LEAVE A MESSAGE FOR THE NURSE SELECT OPTION 2, PLEASE LEAVE A MESSAGE INCLUDING: ?YOUR NAME ?DATE OF BIRTH ?CALL BACK NUMBER ?REASON FOR CALL**this is important as we prioritize the call backs ? ?YOU WILL RECEIVE A CALL BACK THE SAME DAY AS LONG AS YOU CALL BEFORE 4:00 PM ? ?At the [facility], you and your health needs are our priority. As part of our continuing mission to provide you with exceptional heart care, we have created designated Provider Care Teams. These Care Teams include your primary Cardiologist (physician) and Advanced Practice Providers (APPs- Physician Assistants and Nurse Practitioners) who all work together to provide you with the care you need, when you need it.  ? ?You may see any of the following providers on your designated Care Team at your next follow up: ?Dr Glori Bickers ?Dr Loralie Champagne ?Darrick Grinder, NP ?Lyda Jester, PA ?Jessica Milford,NP ?Marlyce Huge, PA ?Audry Riles, PharmD ? ? ?Please be sure to bring in all your medications bottles to every appointment.  ? ? ?

## 2021-12-29 ENCOUNTER — Ambulatory Visit (HOSPITAL_COMMUNITY)
Admission: RE | Admit: 2021-12-29 | Discharge: 2021-12-29 | Disposition: A | Discharge status: Home / Self Care | Payer: Medicare HMO | Source: Ambulatory Visit | Attending: Internal Medicine | Admitting: Internal Medicine

## 2021-12-29 ENCOUNTER — Other Ambulatory Visit: Payer: Self-pay

## 2021-12-29 DIAGNOSIS — I5022 Chronic systolic (congestive) heart failure: Secondary | ICD-10-CM

## 2022-01-04 ENCOUNTER — Telehealth (HOSPITAL_COMMUNITY): Payer: Self-pay | Admitting: *Deleted

## 2022-01-04 NOTE — Telephone Encounter (Signed)
Pt called c/o constipation and dry throat. Pt advised to contact pcp.  ?

## 2022-01-07 ENCOUNTER — Telehealth (HOSPITAL_COMMUNITY): Payer: Self-pay | Admitting: *Deleted

## 2022-01-07 NOTE — Telephone Encounter (Signed)
TTR genetic results faxed to genetics clinic at [facility] to [facility]. Fax # [phone] ?

## 2022-01-10 NOTE — Progress Notes (Signed)
? ?ADVANCED HF CLINIC NOTE ? ?Primary Care: [name], MD ?Primary Cardiologist: Larae Grooms, MD ?HF Cardiologist: Dr. Haroldine Laws ? ?HPI: ?Mr. Obar is a 66 y.o. male referred by Dr. Irish Lack for further evaluation of his HF.  ? 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 Also has h/o AF s/p AF ablation at [facility] in 2017. CAD s/p previous stent (2008) followed by LIMA to LAD at time of AVR.  ? ?Admitted in 4/22 with near syncope. EF 45% by echo. RV mildly HK. RVSP 12mmHG  ? ?R/L cath 5/22: mLAD 90% (patent LIMA to LAD), LCX and RCA minimal CAD.  RA 9 RV 24/0 PA 25/8 (15) PCWP 5 Fick 3.4/1.6 ? ?We saw him for the first time on 08/23/21 for AHF consult. W/u revealed severe TTR cardiac amyloidosis. Started on tafamadis. CPX with marked HF limitation.  ? ?Admitted 2/23 with R>L HF. Diuresed 14 pounds. D/c weight 171 on Monday. Started on torsemide 40 but hasn't taken it yet. ? ?Echo 12/17/21: EF 45-50% severe LVH RV mod HK. ? ?Follow up 3/23, volume increasing, torsemide 40 mg daily started.  ? ?Today he returns for HF follow up. Overall feeling fine. Main complaint is constipation, trying OTC remedies. No significant SOB, had a line dancing class today and did well. Denies abnormal bleeding, palpitations, CP, dizziness, edema, or PND/Orthopnea. Appetite ok. No fever or chills. Weight at home 165 pounds. Taking all medications. He is planning on moving to [place] in 2 weeks to be near family. Has been set up with cardiologist at [facility] who specializes in amyloid. ? ? ?Cardiac studies: ?- Echo (2/23): EF 45-50%, severe LVH, RV moderate HK ? ?- CPX 09/07/21 ? ?FVC 3.06  (74%)      ?FEV1 2.30 (73%)        ?FEV1/FVC 75 (98%)        ?MVV 82 (58%)  ?     ?BP rest: 94/64 Standing BP: 86/64 BP peak: 100/58  ?Peak VO2: 10.4 (36% predicted peak VO2)  ?VE/VCO2 slope:  48  ?OUES: 1.04  ?Peak RER: 1.09  ?Ventilatory Threshold: 9.3 (33% predicted or measured peak VO2)  ?O2pulse:  8   (46%  predicted O2pulse ? ?- cMRI 09/22/21 ?1. Mild LVE with moderate LVH diffuse hypokinesis EF 40% ?2. Markedly abnormal post gadolinium images with 41.8% myocardial ?uptake over all axial slices Although there is a subendocardial and ?mid myocardial predominance and worse uptake in the basal segments ?there is also transmural uptake in some areas not contained to a ?coronary distribution ?3. Degenerative appearing Bioprosthetic AVR with mild appearing AR ?and thickened leaflets ?4.  Mild RVE/RV hypokinesis RVEF 46% ? ?- PYP Scan 11/03/20 Ratio 2.8 Markedly positive for TTR  ? ? ?Past Medical History:  ?Diagnosis Date  ? Atrial fibrillation ([place])   ? Coronary artery disease   ? Diabetes mellitus without complication ([place])   ? Diabetic polyneuropathy ([place])   ? GERD (gastroesophageal reflux disease)   ? Gout, unspecified   ? HLD (hyperlipidemia)   ? Hx of adenomatous polyp of colon 12/03/2020  ? Hypertension   ? OSA on CPAP   ? Sleep apnea   ? ? ?Current Outpatient Medications  ?Medication Sig Dispense Refill  ? acetaminophen (TYLENOL) 325 MG tablet Take 2 tablets (650 mg total) by mouth every 6 (six) hours as needed for mild pain (or Fever >/= 101). 20 tablet 0  ? allopurinol (ZYLOPRIM) 300 MG tablet  Take 300 mg by mouth daily.    ? colchicine 0.6 MG tablet Take 0.6 mg by mouth as needed (gout).    ? doxycycline (VIBRA-TABS) 100 MG tablet Take 1 tablet prior to dental procedures    ? empagliflozin (JARDIANCE) 10 MG TABS tablet Take 1 tablet (10 mg total) by mouth daily before breakfast. 90 tablet 2  ? Ensure Max Protein (ENSURE MAX PROTEIN) LIQD Take 330 mLs (11 oz total) by mouth at bedtime. 3330 mL 0  ? feeding supplement, GLUCERNA SHAKE, (GLUCERNA SHAKE) LIQD Take 237 mLs by mouth 2 (two) times daily between meals. 2370 mL 0  ? glucose blood (ONETOUCH VERIO) test strip TEST ONCE A DAY DX E11.29    ? lansoprazole (PREVACID) 15 MG capsule Take 15 mg by mouth as needed.    ? loratadine (CLARITIN) 10 MG tablet Take 10 mg by  mouth daily as needed for allergies.    ? magnesium oxide (MAG-OX) 400 MG tablet Take 400 mg by mouth daily.    ? metFORMIN (GLUCOPHAGE) 500 MG tablet Take 1 tablet (500 mg total) by mouth 2 (two) times daily with a meal.    ? midodrine (PROAMATINE) 5 MG tablet Take 1 tablet (5 mg total) by mouth 3 (three) times daily with meals. 90 tablet 6  ? Multiple Vitamins-Minerals (CERTAVITE/ANTIOXIDANTS) TABS Take 1 tablet by mouth daily. 30 tablet 0  ? ondansetron (ZOFRAN) 4 MG tablet Take 1 tablet (4 mg total) by mouth every 6 (six) hours as needed for nausea. 20 tablet 0  ? rosuvastatin (CRESTOR) 5 MG tablet Take 5 mg by mouth every evening.    ? spironolactone (ALDACTONE) 25 MG tablet Take 0.5 tablets (12.5 mg total) by mouth daily. 45 tablet 3  ? tadalafil, PAH, (ADCIRCA) 20 MG tablet Take 20 mg by mouth daily as needed (ED).    ? Tafamidis (VYNDAMAX) 61 MG CAPS Take 1 capsule by mouth daily. 90 capsule 3  ? torsemide (DEMADEX) 20 MG tablet Take 2 tablets (40 mg total) by mouth daily. 60 tablet 0  ? XARELTO 20 MG TABS tablet Take 1 tablet (20 mg total) by mouth every evening. 90 tablet 3  ? ?No current facility-administered medications for this encounter.  ? ?Allergies  ?Allergen Reactions  ? Lipitor [Atorvastatin] Other (See Comments)  ?  Body aches  ? Penicillins Swelling  ?  Swollen face  ? Penicillin G   ?  Other reaction(s): Unknown  ? ?Social History  ? ?Socioeconomic History  ? Marital status: Single  ?  Spouse name: Not on file  ? Number of children: 2  ? Years of education: Not on file  ? Highest education level: Not on file  ?Occupational History  ? Occupation: retired  ?Tobacco Use  ? Smoking status: Former  ?  Packs/day: 0.25  ?  Years: 10.00  ?  Pack years: 2.50  ?  Types: Cigarettes  ?  Quit date: 11/03/1981  ?  Years since quitting: 40.2  ? Smokeless tobacco: Never  ?Vaping Use  ? Vaping Use: Never used  ?Substance and Sexual Activity  ? Alcohol use: Yes  ?  Comment: occasional  ? Drug use: No  ? Sexual  activity: Not on file  ?Other Topics Concern  ? Not on file  ?Social History Narrative  ? 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 Married 1 son 1 daughter  ? 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   ? Pickleball tai chi and line dancing for fun and activity  ? ?Social Determinants of Health  ? ?Financial Resource Strain: Not on file  ?Food Insecurity: Not on file  ?Transportation Needs: Not on file  ?Physical Activity: Not on file  ?Stress: Not on file  ?Social Connections: Not on file  ?Intimate Partner Violence: Not on file  ? ?Family History  ?Problem Relation Age of Onset  ? Congenital heart disease Mother   ? Diabetes Mother   ? Hypertension Mother   ? Heart disease Mother   ? Heart attack Father   ? Cervical cancer Sister   ? Diabetes Sister   ? Hypertension Brother   ? Diabetes Brother   ? Pancreatic cancer Brother   ? Diabetes Brother   ? Breast cancer Sister   ? Diabetes Sister   ? Diabetes Sister   ? Diabetes Sister   ? Diabetes Sister   ? Diabetes Son   ? Colon cancer Neg Hx   ? Colon polyps Neg Hx   ? Esophageal cancer Neg Hx   ? Stomach cancer Neg Hx   ? ?BP 98/64   Pulse 84   Ht $R'6\' 2"'gh$  (1.88 m)   Wt 77.7 kg (171 lb 3.2 oz)   SpO2 99%   BMI 21.98 kg/m?  ?  ?Wt Readings from Last 3 Encounters:  ?01/14/22 77.7 kg (171 lb 3.2 oz)  ?12/22/21 80.4 kg (177 lb 3.2 oz)  ?12/20/21 77.7 kg (171 lb 6.4 oz)  ? ?PHYSICAL EXAM: ?General:  NAD. No resp difficulty, thin ?HEENT: Normal ?Neck: Supple. No JVD. Carotids 2+ bilat; no bruits. No lymphadenopathy or thryomegaly appreciated. ?Cor: PMI nondisplaced. Regular rate & rhythm. No rubs, gallops or murmurs. ?Lungs: Clear ?Abdomen: Soft, nontender, nondistended. No hepatosplenomegaly. No bruits or masses. Good  bowel sounds. ?Extremities: No cyanosis, clubbing, rash, trace BLE edema ?Neuro: Alert & oriented x 3, cranial nerves grossly intact. Moves all 4 extremities w/o difficulty. Affect pleasant. ? ?ASSESSMENT &

## 2022-01-14 ENCOUNTER — Telehealth (HOSPITAL_COMMUNITY): Payer: Self-pay | Admitting: Cardiology

## 2022-01-14 ENCOUNTER — Other Ambulatory Visit: Payer: Self-pay

## 2022-01-14 ENCOUNTER — Encounter (HOSPITAL_COMMUNITY): Payer: Self-pay

## 2022-01-14 ENCOUNTER — Ambulatory Visit (HOSPITAL_COMMUNITY)
Admission: RE | Admit: 2022-01-14 | Discharge: 2022-01-14 | Disposition: A | Discharge status: Home / Self Care | Payer: Medicare HMO | Source: Ambulatory Visit | Attending: Family Medicine | Admitting: Family Medicine

## 2022-01-14 VITALS — BP 98/64 | HR 84 | Ht 74.0 in | Wt 171.2 lb

## 2022-01-14 DIAGNOSIS — Z9989 Dependence on other enabling machines and devices: Secondary | ICD-10-CM | POA: Insufficient documentation

## 2022-01-14 DIAGNOSIS — E1122 Type 2 diabetes mellitus with diabetic chronic kidney disease: Secondary | ICD-10-CM | POA: Insufficient documentation

## 2022-01-14 DIAGNOSIS — I5022 Chronic systolic (congestive) heart failure: Secondary | ICD-10-CM

## 2022-01-14 DIAGNOSIS — Z7984 Long term (current) use of oral hypoglycemic drugs: Secondary | ICD-10-CM | POA: Diagnosis not present

## 2022-01-14 DIAGNOSIS — E854 Organ-limited amyloidosis: Secondary | ICD-10-CM

## 2022-01-14 DIAGNOSIS — Z952 Presence of prosthetic heart valve: Secondary | ICD-10-CM

## 2022-01-14 DIAGNOSIS — G4733 Obstructive sleep apnea (adult) (pediatric): Secondary | ICD-10-CM | POA: Diagnosis not present

## 2022-01-14 DIAGNOSIS — Z953 Presence of xenogenic heart valve: Secondary | ICD-10-CM | POA: Diagnosis not present

## 2022-01-14 DIAGNOSIS — I43 Cardiomyopathy in diseases classified elsewhere: Secondary | ICD-10-CM | POA: Diagnosis not present

## 2022-01-14 DIAGNOSIS — Z951 Presence of aortocoronary bypass graft: Secondary | ICD-10-CM | POA: Insufficient documentation

## 2022-01-14 DIAGNOSIS — N1831 Chronic kidney disease, stage 3a: Secondary | ICD-10-CM | POA: Diagnosis not present

## 2022-01-14 DIAGNOSIS — Z79899 Other long term (current) drug therapy: Secondary | ICD-10-CM | POA: Diagnosis not present

## 2022-01-14 DIAGNOSIS — Z7901 Long term (current) use of anticoagulants: Secondary | ICD-10-CM | POA: Diagnosis not present

## 2022-01-14 DIAGNOSIS — K5900 Constipation, unspecified: Secondary | ICD-10-CM | POA: Diagnosis not present

## 2022-01-14 DIAGNOSIS — I5032 Chronic diastolic (congestive) heart failure: Secondary | ICD-10-CM

## 2022-01-14 DIAGNOSIS — I951 Orthostatic hypotension: Secondary | ICD-10-CM | POA: Insufficient documentation

## 2022-01-14 DIAGNOSIS — I2510 Atherosclerotic heart disease of native coronary artery without angina pectoris: Secondary | ICD-10-CM | POA: Diagnosis not present

## 2022-01-14 DIAGNOSIS — I480 Paroxysmal atrial fibrillation: Secondary | ICD-10-CM | POA: Diagnosis not present

## 2022-01-14 DIAGNOSIS — I130 Hypertensive heart and chronic kidney disease with heart failure and stage 1 through stage 4 chronic kidney disease, or unspecified chronic kidney disease: Secondary | ICD-10-CM | POA: Insufficient documentation

## 2022-01-14 DIAGNOSIS — E1159 Type 2 diabetes mellitus with other circulatory complications: Secondary | ICD-10-CM

## 2022-01-14 LAB — BASIC METABOLIC PANEL
Anion gap: 11 (ref 5–15)
BUN: 53 mg/dL — ABNORMAL HIGH (ref 8–23)
CO2: 25 mmol/L (ref 22–32)
Calcium: 9.7 mg/dL (ref 8.9–10.3)
Chloride: 94 mmol/L — ABNORMAL LOW (ref 98–111)
Creatinine, Ser: 1.88 mg/dL — ABNORMAL HIGH (ref 0.61–1.24)
GFR, Estimated: 39 mL/min — ABNORMAL LOW (ref >60)
Glucose, Bld: 117 mg/dL — ABNORMAL HIGH (ref 70–99)
Potassium: 4.8 mmol/L (ref 3.5–5.1)
Sodium: 130 mmol/L — ABNORMAL LOW (ref 135–145)

## 2022-01-14 MED ORDER — TORSEMIDE 20 MG PO TABS: ORAL_TABLET | ORAL | 3 refills | Status: DC

## 2022-01-14 MED ORDER — TORSEMIDE 20 MG PO TABS: 40.0000 mg | ORAL_TABLET | Freq: Every day | ORAL | 3 refills | Status: DC

## 2022-01-14 NOTE — Patient Instructions (Signed)
Labs done today, your results will be available in MyChart, we will contact you for abnormal readings. ? ?Good luck with your move!!! ? ?If you have any questions or concerns before your next appointment please send [place] a message through [place] or call our office at [phone].   ? ?TO LEAVE A MESSAGE FOR THE NURSE SELECT OPTION 2, PLEASE LEAVE A MESSAGE INCLUDING: ?YOUR NAME ?DATE OF BIRTH ?CALL BACK NUMBER ?REASON FOR CALL**this is important as we prioritize the call backs ? ?YOU WILL RECEIVE A CALL BACK THE SAME DAY AS LONG AS YOU CALL BEFORE 4:00 PM ? ?

## 2022-01-14 NOTE — Telephone Encounter (Signed)
Patient called.  Patient aware.Phone:  ?[phone] (M) ?

## 2022-01-14 NOTE — Telephone Encounter (Signed)
-----   Message from Rafael Bihari, [place] sent at 01/14/2022  3:52 PM EDT ----- ?Kidney function elevated, decrease torsemide to 40 mg daily followed by 20 mg every other day. Repeat BMET in 7-10 days if he is agreeable (he is moving to [place] in 2 weeks). ?

## 2022-01-18 ENCOUNTER — Encounter: Admit: 2022-01-18 | Payer: PRIVATE HEALTH INSURANCE | Primary: Internal Medicine

## 2022-01-18 DIAGNOSIS — G629 Polyneuropathy, unspecified: Secondary | ICD-10-CM

## 2022-01-21 ENCOUNTER — Ambulatory Visit: Payer: Medicare HMO | Admitting: Podiatry

## 2022-01-25 ENCOUNTER — Other Ambulatory Visit (HOSPITAL_COMMUNITY): Payer: Self-pay

## 2022-01-27 ENCOUNTER — Other Ambulatory Visit (HOSPITAL_COMMUNITY): Payer: Medicare HMO

## 2022-01-28 ENCOUNTER — Other Ambulatory Visit (HOSPITAL_COMMUNITY): Payer: Self-pay

## 2022-02-01 ENCOUNTER — Ambulatory Visit (HOSPITAL_COMMUNITY)
Admission: RE | Admit: 2022-02-01 | Discharge: 2022-02-01 | Disposition: A | Discharge status: Home / Self Care | Payer: Medicare HMO | Source: Ambulatory Visit | Attending: Internal Medicine | Admitting: Internal Medicine

## 2022-02-01 ENCOUNTER — Telehealth: Payer: Self-pay | Admitting: Adult Health

## 2022-02-01 DIAGNOSIS — I5032 Chronic diastolic (congestive) heart failure: Secondary | ICD-10-CM | POA: Diagnosis present

## 2022-02-01 DIAGNOSIS — R600 Localized edema: Secondary | ICD-10-CM

## 2022-02-01 DIAGNOSIS — Z8679 Personal history of other diseases of the circulatory system: Secondary | ICD-10-CM

## 2022-02-01 DIAGNOSIS — E8589 Other amyloidosis: Secondary | ICD-10-CM

## 2022-02-01 DIAGNOSIS — I5021 Acute systolic (congestive) heart failure: Secondary | ICD-10-CM

## 2022-02-01 DIAGNOSIS — G4733 Obstructive sleep apnea (adult) (pediatric): Secondary | ICD-10-CM

## 2022-02-01 DIAGNOSIS — I25119 Atherosclerotic heart disease of native coronary artery with unspecified angina pectoris: Secondary | ICD-10-CM

## 2022-02-01 LAB — BASIC METABOLIC PANEL
Anion gap: 9 (ref 5–15)
BUN: 61 mg/dL — ABNORMAL HIGH (ref 8–23)
CO2: 29 mmol/L (ref 22–32)
Calcium: 10.2 mg/dL (ref 8.9–10.3)
Chloride: 97 mmol/L — ABNORMAL LOW (ref 98–111)
Creatinine, Ser: 1.76 mg/dL — ABNORMAL HIGH (ref 0.61–1.24)
GFR, Estimated: 42 mL/min — ABNORMAL LOW (ref >60)
Glucose, Bld: 106 mg/dL — ABNORMAL HIGH (ref 70–99)
Potassium: 4.4 mmol/L (ref 3.5–5.1)
Sodium: 135 mmol/L (ref 135–145)

## 2022-02-01 NOTE — Telephone Encounter (Signed)
Patient came in with CPAP he sees [place]. His machine is falling a part and he needs a new one. He got the original CPAP in [place] so please let him know what he needs to do. Coralyn Pear said that a new referral needs to be written to the CPAP company but he may have to be seen. The patient is also going between [place] and [place]. Please call and advise. ?Thank you  ?

## 2022-02-01 NOTE — Telephone Encounter (Signed)
I called pt back.  He is asking about what he should do relating to his cpap machine (that is falling apart).  He has used [facility] for supplies.  He is moving/ relocating back to [place] to his family to help assist him as he has new diagnosis of Amyloidosis.  I relayed since he originally got machine in [place] ([place]) from provider there.  Would be better for him to go back to them and renew relationship with them since they have his records.  Since its been a long time since he had sleep study, provider  may want to repeat and see if things have changed for him.  I relayed that once he gets back there if they need records from [place] they can request.  He appreciated call back.   ?

## 2022-02-03 NOTE — Addendum Note (Signed)
Addended by: Larey Seat on: 02/03/2022 05:11 PM ? ? Modules accepted: Orders ? ?

## 2022-02-03 NOTE — Telephone Encounter (Addendum)
Spoke with patient needing a new CPAP Machine . Pt states CPAP machine is falling apart. Pt states that has his current CPAP machine at least 5 years. Looking back at notes  around 2018 . Pt states he plans to move back to [place] but doesn't know when. Informed patient have to forward to Dr.Dohmeier for review Patient expressed understanding and thanked me for calling  ? ? ? ?

## 2022-02-03 NOTE — Telephone Encounter (Signed)
Patient with cardiac amyloidosis on CPAP:  ? ?There is a lot of residual apnea seen- AHI 26.3/h. !!! Air leakage certainly can distort these values but its a lot of artefact.  ?Patient will take either a HST to have a new baseline for apnea treatment, or will undergo in lab titration ( he may need CPAP or BiPAP ). I don't just send an order for a new CPAP without knowing how his underlying conditions have changed his apnea.  ? ?Larey Seat, MD  ? ?

## 2022-02-03 NOTE — Telephone Encounter (Signed)
Spoke to patient Dr Brett Fairy ordered a HST and CPAP Titration. Informed patient will have to get insurance approval. After insurance approves test will call back to set up appointment . Made patient aware this is the only way to receive new CPAP machine. Pt expressed understanding and thanked me for calling . ?

## 2022-02-03 NOTE — Telephone Encounter (Signed)
Pt said, PCP informed pt your neurologist was the last one to get download from machine and should be able to proof that using machine regularly. Would like a call from the nurse. ?

## 2022-02-08 ENCOUNTER — Telehealth: Admit: 2022-02-08 | Payer: PRIVATE HEALTH INSURANCE | Primary: Internal Medicine

## 2022-02-08 NOTE — Telephone Encounter
Received call from patient. He has been diagnosed with amyloidosis at [facility]. He is moving to [place] and transferring care but in the meantime, his provider has told him his family members should have genetic testing done. Family members are here in [place]. He has paperwork for them but has been told its time sensitive. He does not know how to start this process. He can be reached at [phone].

## 2022-02-10 ENCOUNTER — Other Ambulatory Visit (HOSPITAL_COMMUNITY): Payer: Self-pay

## 2022-02-14 ENCOUNTER — Other Ambulatory Visit (HOSPITAL_COMMUNITY): Payer: Self-pay | Admitting: *Deleted

## 2022-02-14 DIAGNOSIS — E854 Organ-limited amyloidosis: Secondary | ICD-10-CM

## 2022-02-14 MED ORDER — MIDODRINE HCL 5 MG PO TABS: 5.0000 mg | ORAL_TABLET | Freq: Three times a day (TID) | ORAL | 0 refills | Status: DC

## 2022-02-14 MED ORDER — SPIRONOLACTONE 25 MG PO TABS: 12.5000 mg | ORAL_TABLET | Freq: Every day | ORAL | 0 refills | Status: DC

## 2022-02-16 ENCOUNTER — Telehealth (HOSPITAL_COMMUNITY): Payer: Self-pay | Admitting: Licensed Clinical Social Worker

## 2022-02-16 NOTE — Telephone Encounter (Signed)
CSW received call from pt regarding medications- concerned if he should still be on vyndamax while receiing vutrisian injections.  CSW discussed with triage- pt amyloid is now being managed through [facility] specialty clinic so pt should be referred back to them to discuss continuing vyndamax while on injections- pt informed and will follow up with them ? ?Jorge Ny, LCSW ?Clinical Social Worker ?[facility] ?Desk#: [phone] ?Cell#: [phone] ? ?

## 2022-02-18 ENCOUNTER — Telehealth: Admit: 2022-02-18 | Payer: PRIVATE HEALTH INSURANCE | Attending: Cardiovascular Disease | Primary: Internal Medicine

## 2022-02-18 DIAGNOSIS — I509 Heart failure, unspecified: Secondary | ICD-10-CM

## 2022-02-18 NOTE — Telephone Encounter
Patient has a NP appointment scheduled with Dr. Hyacinth Meeker for Tuesday, 5/16. Patient wants to know if labs can be ordered prior to his visit so the results would be available at the time of the consultation. Please advise.

## 2022-02-22 ENCOUNTER — Inpatient Hospital Stay
Admit: 2022-02-22 | Discharge: 2022-02-26 | Payer: PRIVATE HEALTH INSURANCE | Attending: Hospitalist | Admitting: Cardiovascular Disease

## 2022-02-22 ENCOUNTER — Emergency Department: Admit: 2022-02-22 | Payer: PRIVATE HEALTH INSURANCE | Primary: Internal Medicine

## 2022-02-22 ENCOUNTER — Inpatient Hospital Stay: Admit: 2022-02-22 | Payer: PRIVATE HEALTH INSURANCE | Attending: Diagnostic Radiology

## 2022-02-22 ENCOUNTER — Inpatient Hospital Stay: Admit: 2022-02-22 | Payer: PRIVATE HEALTH INSURANCE

## 2022-02-22 ENCOUNTER — Encounter: Admit: 2022-02-22 | Payer: PRIVATE HEALTH INSURANCE | Attending: Internal Medicine | Primary: Internal Medicine

## 2022-02-22 DIAGNOSIS — E7800 Pure hypercholesterolemia, unspecified: Secondary | ICD-10-CM

## 2022-02-22 DIAGNOSIS — E119 Type 2 diabetes mellitus without complications: Secondary | ICD-10-CM

## 2022-02-22 DIAGNOSIS — F488 Other specified nonpsychotic mental disorders: Secondary | ICD-10-CM

## 2022-02-22 DIAGNOSIS — R0602 Shortness of breath: Secondary | ICD-10-CM

## 2022-02-22 DIAGNOSIS — I2510 Atherosclerotic heart disease of native coronary artery without angina pectoris: Secondary | ICD-10-CM

## 2022-02-22 DIAGNOSIS — I10 Essential (primary) hypertension: Secondary | ICD-10-CM

## 2022-02-22 DIAGNOSIS — Z5189 Encounter for other specified aftercare: Secondary | ICD-10-CM

## 2022-02-22 DIAGNOSIS — K219 Gastro-esophageal reflux disease without esophagitis: Secondary | ICD-10-CM

## 2022-02-22 DIAGNOSIS — G4733 Obstructive sleep apnea (adult) (pediatric): Secondary | ICD-10-CM

## 2022-02-22 DIAGNOSIS — I499 Cardiac arrhythmia, unspecified: Secondary | ICD-10-CM

## 2022-02-22 LAB — HEPATIC FUNCTION PANEL
BKR A/G RATIO: 1 (ref 1.0–2.2)
BKR ALANINE AMINOTRANSFERASE (ALT): 15 U/L (ref 9–59)
BKR ALBUMIN: 4.3 g/dL (ref 3.6–4.9)
BKR ALKALINE PHOSPHATASE: 121 U/L (ref 9–122)
BKR ASPARTATE AMINOTRANSFERASE (AST): 49 U/L — ABNORMAL HIGH (ref 10–35)
BKR AST/ALT RATIO: 3.3
BKR BILIRUBIN DIRECT: 0.6 mg/dL — ABNORMAL HIGH (ref <=0.3)
BKR BILIRUBIN TOTAL: 1.3 mg/dL — ABNORMAL HIGH (ref <=1.2)
BKR GLOBULIN: 4.4 g/dL — ABNORMAL HIGH (ref 2.3–3.5)
BKR PROTEIN TOTAL: 8.7 g/dL (ref 6.6–8.7)

## 2022-02-22 LAB — BASIC METABOLIC PANEL
BKR ANION GAP: 13 (ref 7–17)
BKR BLOOD UREA NITROGEN: 53 mg/dL — ABNORMAL HIGH (ref 8–23)
BKR BUN / CREAT RATIO: 29.1 — ABNORMAL HIGH (ref 8.0–23.0)
BKR CALCIUM: 10.4 mg/dL — ABNORMAL HIGH (ref 8.8–10.2)
BKR CHLORIDE: 94 mmol/L — ABNORMAL LOW (ref 98–107)
BKR CO2: 27 mmol/L (ref 20–30)
BKR CREATININE: 1.82 mg/dL — ABNORMAL HIGH (ref 0.40–1.30)
BKR EGFR, CREATININE (CKD-EPI 2021): 40 mL/min/{1.73_m2} — ABNORMAL LOW (ref >=60)
BKR GLUCOSE: 89 mg/dL (ref 70–100)
BKR POTASSIUM: 4.4 mmol/L (ref 3.3–5.3)
BKR SODIUM: 134 mmol/L — ABNORMAL LOW (ref 136–144)

## 2022-02-22 LAB — CBC WITH AUTO DIFFERENTIAL
BKR WAM ABSOLUTE IMMATURE GRANULOCYTES.: 0.02 x 1000/ÂµL (ref 0.00–0.30)
BKR WAM ABSOLUTE LYMPHOCYTE COUNT.: 1.79 x 1000/ÂµL (ref 0.60–3.70)
BKR WAM ABSOLUTE NRBC (2 DEC): 0 x 1000/ÂµL (ref 0.00–1.00)
BKR WAM ANALYZER ANC: 3.52 x 1000/ÂµL (ref 2.00–7.60)
BKR WAM BASOPHIL ABSOLUTE COUNT.: 0.05 x 1000/ÂµL (ref 0.00–1.00)
BKR WAM BASOPHILS: 0.8 % (ref 0.0–1.4)
BKR WAM EOSINOPHIL ABSOLUTE COUNT.: 0.02 x 1000/ÂµL (ref 0.00–1.00)
BKR WAM EOSINOPHILS: 0.3 % (ref 0.0–5.0)
BKR WAM HEMATOCRIT (2 DEC): 43.3 % (ref 38.50–50.00)
BKR WAM HEMOGLOBIN: 14.4 g/dL (ref 13.2–17.1)
BKR WAM IMMATURE GRANULOCYTES: 0.3 % (ref 0.0–1.0)
BKR WAM LYMPHOCYTES: 30.3 % (ref 17.0–50.0)
BKR WAM MCH (PG): 29.8 pg (ref 27.0–33.0)
BKR WAM MCHC: 33.3 g/dL (ref 31.0–36.0)
BKR WAM MCV: 89.5 fL (ref 80.0–100.0)
BKR WAM MONOCYTE ABSOLUTE COUNT.: 0.5 x 1000/ÂµL (ref 0.00–1.00)
BKR WAM MONOCYTES: 8.5 % (ref 4.0–12.0)
BKR WAM MPV: 10.2 fL (ref 8.0–12.0)
BKR WAM NEUTROPHILS: 59.8 % (ref 39.0–72.0)
BKR WAM NUCLEATED RED BLOOD CELLS: 0 % (ref 0.0–1.0)
BKR WAM PLATELETS: 224 x1000/ÂµL (ref 150–420)
BKR WAM RDW-CV: 18 % — ABNORMAL HIGH (ref 11.0–15.0)
BKR WAM RED BLOOD CELL COUNT.: 4.84 M/ÂµL (ref 4.00–6.00)
BKR WAM WHITE BLOOD CELL COUNT: 5.9 x1000/ÂµL (ref 4.0–11.0)

## 2022-02-22 LAB — TROPONIN T HIGH SENSITIVITY, 1 HOUR WITH REFLEX (BH GH LMW YH)
BKR TROPONIN T HS 1 HOUR DELTA FROM 0 HOUR: -21 ng/L
BKR TROPONIN T HS 1 HOUR: 101 ng/L — CR

## 2022-02-22 LAB — TROPONIN T HIGH SENSITIVITY, 0 HOUR BASELINE WITH REFLEX (BH GH LMW YH): BKR TROPONIN T HS 0 HOUR BASELINE: 122 ng/L — CR

## 2022-02-22 LAB — TROPONIN T HIGH SENSITIVITY, 3 HOUR (BH GH LMW YH)
BKR TROPONIN T HS 1 HOUR DELTA FROM 0 HOUR ON 3HR: -21 ng/L
BKR TROPONIN T HS 3 HOUR DELTA FROM 0 HOUR: -6 ng/L
BKR TROPONIN T HS 3 HOUR: 116 ng/L — CR

## 2022-02-22 LAB — SARS COV-2 (COVID-19) RNA: BKR SARS-COV-2 RNA (COVID-19) (YH): NEGATIVE

## 2022-02-22 LAB — NT-PROBNPE: BKR B-TYPE NATRIURETIC PEPTIDE, PRO (PROBNP): 2711 pg/mL — ABNORMAL HIGH (ref <125.0)

## 2022-02-22 LAB — MAGNESIUM: BKR MAGNESIUM: 2.7 mg/dL — ABNORMAL HIGH (ref 1.7–2.4)

## 2022-02-22 MED ORDER — RIVAROXABAN 20 MG TABLET
20 mg | Freq: Every day | ORAL | Status: DC
Start: 2022-02-22 — End: 2022-02-23

## 2022-02-22 MED ORDER — LOSARTAN 50 MG TABLET
50 mg | Freq: Every day | ORAL | Status: DC
Start: 2022-02-22 — End: 2022-02-23
  Administered 2022-02-23: 12:00:00 50 mg via ORAL

## 2022-02-22 MED ORDER — BUMETANIDE 0.25 MG/ML INJECTION SOLUTION
0.25 mg/mL | Freq: Once | INTRAVENOUS | Status: CP
Start: 2022-02-22 — End: ?
  Administered 2022-02-23: 12:00:00 0.25 mL via INTRAVENOUS

## 2022-02-22 MED ORDER — SODIUM CHLORIDE 0.9 % (FLUSH) INJECTION SYRINGE
0.9 % | Freq: Three times a day (TID) | INTRAVENOUS | Status: DC
Start: 2022-02-22 — End: 2022-02-27
  Administered 2022-02-23 – 2022-02-26 (×5): 0.9 mL via INTRAVENOUS

## 2022-02-22 MED ORDER — TADALAFIL 20 MG TABLET (PULMONARY HYPERTENSION)
20 mg | Freq: Every day | ORAL | Status: DC
Start: 2022-02-22 — End: 2022-02-23

## 2022-02-22 MED ORDER — SODIUM CHLORIDE 0.9 % (FLUSH) INJECTION SYRINGE
0.9 % | INTRAVENOUS | Status: DC | PRN
Start: 2022-02-22 — End: 2022-02-27

## 2022-02-22 MED ORDER — SPIRONOLACTONE 25 MG TABLET
25 mg | Freq: Every day | ORAL | Status: DC
Start: 2022-02-22 — End: 2022-02-23
  Administered 2022-02-23: 12:00:00 25 mg via ORAL

## 2022-02-22 MED ORDER — FUROSEMIDE 10 MG/ML INJECTION SOLUTION
10 mg/mL | Freq: Once | INTRAVENOUS | Status: CP
Start: 2022-02-22 — End: ?
  Administered 2022-02-22: 20:00:00 10 mL via INTRAVENOUS

## 2022-02-22 NOTE — Plan of Care
Problem: Adult Inpatient Plan of CareGoal: Plan of Care ReviewOutcome: Interventions implemented as appropriateGoal: Patient-Specific Goal (Individualized)Outcome: Interventions implemented as appropriateGoal: Absence of Hospital-Acquired Illness or InjuryOutcome: Interventions implemented as appropriateGoal: Optimal Comfort and WellbeingOutcome: Interventions implemented as appropriateGoal: Readiness for Transition of CareOutcome: Interventions implemented as appropriate Admission Note Nursing Frank Bradley is a 66 y.o. male admitted with a chief complaint of SOB Patient arrived from [place] Patient is  Alert and oriented x4 independent, denies pain and discomfort, administered for difficulty breathing. Currently on room air , appears comfortable. Critical lab called in at 7 pm with troponin  level of 116. Oriented patient to room, instructed to call when assistance is needed/. Tele monitor is in place. Covering provider is aware patient is in room . Home medications placed in patient home med cabinet. Vital signs within acceptable parameters. Care to be endorsed to pm nurse.  Vitals:  02/22/22 1119 02/22/22 1405 02/22/22 1800 BP: 127/70 110/79  Pulse: 80 67  Resp: 17 15  Temp: 97.2 ?F (36.2 ?C) 97.6 ?F (36.4 ?C)  TempSrc: Oral Temporal  SpO2: (!) 93% 100%  Weight:   77.3 kg Height:   6' 2 (1.88 m) Oxygen therapy Oxygen TherapySpO2: 100 %Device (Oxygen Therapy): room airCheck SpO2 on Rest and Exertion: yesSpO2 on Room Air at Rest: (S) 97SpO2 on Room Air with Exertion: (S) 95Oxygen Device : (S) room airI have reviewed the patient's current medication orders..I have reviewed patient valuables Belongings charted in last 7 days: Valuable(s) : Cell Phone; Laptop; Wallet (02/22/2022  7:00 PM) CommentsSee flowsheets, patient education and plan of care for additional information. Plan of Care Overview/ Patient Status

## 2022-02-22 NOTE — Utilization Review (ED)
UM Status: Managed Medicare IP: SOB, PMH Card Amyloidosis trop 122, 122, ekg - sr, PVC, short pr interval, rbbb, lafb, lateral q waves, per Cards, pmh cad, bicuspid av, s/p bioSAVR, 1vCABG, IV lasix 40

## 2022-02-22 NOTE — ED Notes
12:45 PM Chief Complaint Patient presents with ? Shortness of Breath   C/o sob x few days ,no chest pain.+slight cough.no fever. Pt on diuretics, no leg edema Past Medical History: Diagnosis Date ? Arrhythmia  ? Chronic coronary artery disease  ? Diabetes mellitus (HC Code) (HC CODE) (HC Code)  ? Encounter for blood transfusion  ? GERD (gastroesophageal reflux disease)  ? Hypercholesteremia  ? Hypertension  ? Obstructive sleep apnea  ? Syncope, psychogenic  1:53 PM pt first trop 122, MD aware. Second trop sent. EKG being completed.2:31 PM Reesa Chew MD at bedside.2:55 PM plan to admitFloor Handoff Telemetry: 	[x]  Yes		[]  NoCode Status:   [x]  Full		[]  DNR		[]  DNI		Other (specify):Safety Precautions: []  Fall Risk  []  Sitter   []  Restraints	[]  Suicidal	[x]  None	Other (specify):Mentation/Orientation:	 A&O (Self, person, place, time) x  4   	 Disoriented to:                    	 Special Accommodations: []  Hearing impaired   []  Blind  []  Nonverbal  []  Cognitive impairmentOxygenation Upon Admission: [x]  RA	[]  NC	[]  Venti  []  Simple Mask []  Other	Baseline O2 Status? [x]  Yes	[]  NoAmbulation: [x]  Independent	[]  Cane   []  Walker	[]  Wheelchair	[]  Bedbound		[]  Hemiplegic	[]  Paraplegic	[]  QuadraplegicEliminiation: [x]  Independent	[]  Commode	[]  Bedpan/Urinal  []  Straight Cath []  Foley cath			[]  Urostomy	[]  Colostomy	Other (specify):Diarrhea/Loose stool : []  1x within 24h  []  2x within 24h  []  3x within 24h  [x]  None 	C.Diff Order: 	[]  Ordered- needs to be collected             []  Collected-sent to lab             []  Resulted - Negative C.Diff             []  Resulted - Positive C.Diff[x]  Not Ordered   [x]  N/ASkin Alteration: []  Pressure Injury []  Wound []  None [x]  Skin not assessedDiet: [x]  Regular/No order placed	[]  NPO		Other (specify):IV Access: [x]  PIV   []  PICC    []  Port    [] Central line    []  A-line    Other (specify)IVF/GTT Running Upon ED Departure? [x]  No	    []  Yes (specify):Outstanding Meds/Treatments/Tests:Patient Belongings:Are the belongings documented?          [x]  No	    []  YesIs someone taking belongings home?   [x]  No     []  Yes  Who? (specify)                                   ED RN and Contact number/MHB #:  Onnika Siebel   	4:11 PM Lasix administered per MAR4:40 PM MD at bedside, pending transport to [facility] to IP bed.5:11 PM Last trop sent. Pt remains pending transport to [facility] IP bed.

## 2022-02-22 NOTE — ED Notes
1:07 PM: Ambulatory O2 SAT 95% RA.

## 2022-02-22 NOTE — Telephone Encounter
Yes, I placed orders for him

## 2022-02-23 ENCOUNTER — Inpatient Hospital Stay: Admit: 2022-02-23 | Payer: PRIVATE HEALTH INSURANCE

## 2022-02-23 DIAGNOSIS — R0602 Shortness of breath: Secondary | ICD-10-CM

## 2022-02-23 DIAGNOSIS — J9601 Acute respiratory failure with hypoxia: Secondary | ICD-10-CM

## 2022-02-23 DIAGNOSIS — R072 Precordial pain: Secondary | ICD-10-CM

## 2022-02-23 LAB — COMPREHENSIVE METABOLIC PANEL
BKR A/G RATIO: 1 (ref 1.0–2.2)
BKR ALANINE AMINOTRANSFERASE (ALT): 11 U/L (ref 9–59)
BKR ALBUMIN: 3.9 g/dL (ref 3.6–4.9)
BKR ALKALINE PHOSPHATASE: 112 U/L (ref 9–122)
BKR ANION GAP: 12 (ref 7–17)
BKR ASPARTATE AMINOTRANSFERASE (AST): 40 U/L — ABNORMAL HIGH (ref 10–35)
BKR AST/ALT RATIO: 3.6
BKR BILIRUBIN TOTAL: 1.1 mg/dL (ref <=1.2)
BKR BLOOD UREA NITROGEN: 59 mg/dL — ABNORMAL HIGH (ref 8–23)
BKR BUN / CREAT RATIO: 32.8 — ABNORMAL HIGH (ref 8.0–23.0)
BKR CALCIUM: 9.6 mg/dL (ref 8.8–10.2)
BKR CHLORIDE: 92 mmol/L — ABNORMAL LOW (ref 98–107)
BKR CO2: 26 mmol/L (ref 20–30)
BKR CREATININE: 1.8 mg/dL — ABNORMAL HIGH (ref 0.40–1.30)
BKR EGFR, CREATININE (CKD-EPI 2021): 41 mL/min/{1.73_m2} — ABNORMAL LOW (ref >=60)
BKR GLOBULIN: 3.8 g/dL — ABNORMAL HIGH (ref 2.3–3.5)
BKR GLUCOSE: 87 mg/dL (ref 70–100)
BKR POTASSIUM: 4 mmol/L (ref 3.3–5.3)
BKR PROTEIN TOTAL: 7.7 g/dL (ref 6.6–8.7)
BKR SODIUM: 130 mmol/L — ABNORMAL LOW (ref 136–144)

## 2022-02-23 LAB — CBC WITHOUT DIFFERENTIAL
BKR ALBUMIN: 208 x1000/??L (ref 150–420)
BKR BILIRUBIN TOTAL: 9.8 fL — ABNORMAL LOW (ref 8.0–12.0)
BKR WAM ANALYZER ANC: 3.18 x 1000/ÂµL (ref 2.00–7.60)
BKR WAM HEMATOCRIT (2 DEC): 39.7 % (ref 38.50–50.00)
BKR WAM HEMOGLOBIN: 12.9 g/dL — ABNORMAL LOW (ref 13.2–17.1)
BKR WAM MCH (PG): 28.6 pg (ref 27.0–33.0)
BKR WAM MCHC: 32.5 g/dL (ref 31.0–36.0)
BKR WAM MCV: 88 fL (ref 80.0–100.0)
BKR WAM MPV: 9.8 fL (ref 8.0–12.0)
BKR WAM PLATELETS: 208 x1000/ÂµL (ref 150–420)
BKR WAM RDW-CV: 18 % — ABNORMAL HIGH (ref 11.0–15.0)
BKR WAM RED BLOOD CELL COUNT.: 4.51 M/ÂµL (ref 4.00–6.00)
BKR WAM WHITE BLOOD CELL COUNT: 5.4 x1000/ÂµL (ref 4.0–11.0)

## 2022-02-23 LAB — TROPONIN T HIGH SENSITIVITY, 0 HOUR BASELINE WITH REFLEX (BH GH LMW YH): BKR TROPONIN T HS 0 HOUR BASELINE: 154 ng/L — CR

## 2022-02-23 LAB — HEMOGLOBIN A1C
BKR ESTIMATED AVERAGE GLUCOSE: 146 mg/dL
BKR HEMOGLOBIN A1C: 6.7 % — ABNORMAL HIGH (ref 4.0–5.6)

## 2022-02-23 LAB — BASIC METABOLIC PANEL
BKR ANION GAP: 11 (ref 7–17)
BKR BLOOD UREA NITROGEN: 59 mg/dL — ABNORMAL HIGH (ref 8–23)
BKR BUN / CREAT RATIO: 26.8 — ABNORMAL HIGH (ref 8.0–23.0)
BKR CALCIUM: 9.2 mg/dL (ref 8.8–10.2)
BKR CHLORIDE: 95 mmol/L — ABNORMAL LOW (ref 98–107)
BKR CO2: 24 mmol/L (ref 20–30)
BKR CREATININE: 2.2 mg/dL — ABNORMAL HIGH (ref 0.40–1.30)
BKR EGFR, CREATININE (CKD-EPI 2021): 32 mL/min/{1.73_m2} — ABNORMAL LOW (ref >=60)
BKR GLUCOSE: 112 mg/dL — ABNORMAL HIGH (ref 70–100)
BKR POTASSIUM: 4.8 mmol/L (ref 3.3–5.3)
BKR SODIUM: 130 mmol/L — ABNORMAL LOW (ref 136–144)

## 2022-02-23 LAB — LACTIC ACID, PLASMA: BKR LACTATE: 1.4 mmol/L (ref 0.5–2.2)

## 2022-02-23 LAB — TROPONIN T HIGH SENSITIVITY, 1 HOUR WITH REFLEX (BH GH LMW YH)
BKR TROPONIN T HS 1 HOUR DELTA FROM 0 HOUR: -14 ng/L
BKR TROPONIN T HS 1 HOUR: 140 ng/L — CR

## 2022-02-23 LAB — MAGNESIUM: BKR MAGNESIUM: 2.5 mg/dL — ABNORMAL HIGH (ref 1.7–2.4)

## 2022-02-23 MED ORDER — TAFAMIDIS 61 MG CAPSULE
61 mg | Freq: Every day | ORAL | Status: DC
Start: 2022-02-23 — End: 2022-02-27
  Administered 2022-02-23 – 2022-02-26 (×4): 61 mg via ORAL

## 2022-02-23 MED ORDER — POLYETHYLENE GLYCOL 3350 17 GRAM ORAL POWDER PACKET
17 gram | Freq: Every day | ORAL | Status: DC
Start: 2022-02-23 — End: 2022-02-25
  Administered 2022-02-23 – 2022-02-25 (×3): 17 gram via ORAL

## 2022-02-23 MED ORDER — PERFLUTREN LIPID MICROSPHERES 1.3 ML/10 ML NS INJ (IN
Freq: Once | INTRAVENOUS | Status: CP | PRN
Start: 2022-02-23 — End: ?
  Administered 2022-02-23: 14:00:00 10.000 mL via INTRAVENOUS

## 2022-02-23 MED ORDER — FRUIT JUICE
ORAL | Status: DC | PRN
Start: 2022-02-23 — End: 2022-02-27

## 2022-02-23 MED ORDER — DEXTROSE 10 % IV BOLUS FOR ORDERABLE
INTRAVENOUS | Status: DC | PRN
Start: 2022-02-23 — End: 2022-02-27

## 2022-02-23 MED ORDER — METOPROLOL SUCCINATE ER 50 MG TABLET,EXTENDED RELEASE 24 HR
50 mg | Freq: Every day | ORAL | Status: DC
Start: 2022-02-23 — End: 2022-02-23

## 2022-02-23 MED ORDER — MIDODRINE 5 MG TABLET
5 mg | Freq: Two times a day (BID) | ORAL | Status: DC
Start: 2022-02-23 — End: 2022-02-24
  Administered 2022-02-23 – 2022-02-24 (×3): 5 mg via ORAL

## 2022-02-23 MED ORDER — INSULIN LISPRO 100 UNIT/ML (SLIDING SCALE)
100 unit/mL | Freq: Three times a day (TID) | SUBCUTANEOUS | Status: DC
Start: 2022-02-23 — End: 2022-02-27
  Administered 2022-02-24: 23:00:00 100 unit/mL via SUBCUTANEOUS

## 2022-02-23 MED ORDER — DAPAGLIFLOZIN PROPANEDIOL 5 MG TABLET
5 mg | Freq: Every day | ORAL | Status: DC
Start: 2022-02-23 — End: 2022-02-24
  Administered 2022-02-23: 17:00:00 5 mg via ORAL

## 2022-02-23 MED ORDER — ACETAMINOPHEN 325 MG TABLET
325 mg | Freq: Once | ORAL | Status: CP
Start: 2022-02-23 — End: ?
  Administered 2022-02-23: 05:00:00 325 mg via ORAL

## 2022-02-23 MED ORDER — NOREPINEPHRINE BITARTRATE 4 MG/250 ML (16 MCG/ML) IN DEXTROSE 5 % IV
425016 mg/250 mL (16 mcg/mL) | Status: DC
Start: 2022-02-23 — End: 2022-02-23

## 2022-02-23 MED ORDER — METFORMIN IMMEDIATE RELEASE 500 MG TABLET
500 mg | Freq: Two times a day (BID) | ORAL | Status: DC
Start: 2022-02-23 — End: 2022-02-24

## 2022-02-23 MED ORDER — NOREPINEPHRINE BITARTRATE 4 MG/250 ML (16 MCG/ML) IN DEXTROSE 5 % IV
4 mg/250 mL (16 mcg/mL) | INTRAVENOUS | Status: DC
Start: 2022-02-23 — End: 2022-02-24
  Administered 2022-02-23 – 2022-02-24 (×2): 4 mL/h via INTRAVENOUS

## 2022-02-23 MED ORDER — SPIRONOLACTONE 25 MG TABLET
25 mg | Freq: Every day | ORAL | Status: AC
Start: 2022-02-23 — End: ?

## 2022-02-23 MED ORDER — TAFAMIDIS 61 MG CAPSULE
61 mg | Freq: Every day | ORAL | Status: AC
Start: 2022-02-23 — End: ?

## 2022-02-23 MED ORDER — NON-FORMULARY/RESTRICTED MEDICATION REQUEST
Freq: Once | Status: DC
Start: 2022-02-23 — End: 2022-02-23

## 2022-02-23 MED ORDER — SODIUM CHLORIDE 0.9 % BOLUS (NEW BAG)
0.9 % | Freq: Once | INTRAVENOUS | Status: DC
Start: 2022-02-23 — End: 2022-02-25

## 2022-02-23 MED ORDER — DEXTROSE 15 GRAM/60 ML ORAL LIQUID
15 gram/60 mL | ORAL | Status: DC | PRN
Start: 2022-02-23 — End: 2022-02-27

## 2022-02-23 MED ORDER — ZZ IMS TEMPLATE
Freq: Every day | ORAL | Status: DC
Start: 2022-02-23 — End: 2022-02-27
  Administered 2022-02-24 – 2022-02-26 (×3): 300 mg via ORAL

## 2022-02-23 MED ORDER — INSULIN LISPRO 100 UNIT/ML (SLIDING SCALE)
100 unit/mL | Freq: Every evening | SUBCUTANEOUS | Status: DC
Start: 2022-02-23 — End: 2022-02-27

## 2022-02-23 MED ORDER — MULTIVITAMIN CAPSULE
Freq: Every day | ORAL | Status: AC
Start: 2022-02-23 — End: 2022-05-17

## 2022-02-23 MED ORDER — ROSUVASTATIN 5 MG TABLET
5 mg | Freq: Every evening | ORAL | Status: DC
Start: 2022-02-23 — End: 2022-02-27
  Administered 2022-02-24 – 2022-02-26 (×3): 5 mg via ORAL

## 2022-02-23 MED ORDER — SKIM MILK
ORAL | Status: DC | PRN
Start: 2022-02-23 — End: 2022-02-27

## 2022-02-23 MED ORDER — GLUCAGON 1 MG/ML IN STERILE WATER
Freq: Once | INTRAMUSCULAR | Status: DC | PRN
Start: 2022-02-23 — End: 2022-02-27

## 2022-02-23 MED ORDER — RIVAROXABAN 15 MG TABLET
15 mg | Freq: Every day | ORAL | Status: DC
Start: 2022-02-23 — End: 2022-02-25
  Administered 2022-02-23 – 2022-02-24 (×2): 15 mg via ORAL

## 2022-02-23 MED ORDER — SENNOSIDES 8.6 MG TABLET
8.6 mg | Freq: Every evening | ORAL | Status: DC
Start: 2022-02-23 — End: 2022-02-27
  Administered 2022-02-24 – 2022-02-26 (×3): 8.6 mg via ORAL

## 2022-02-23 MED ORDER — SPIRONOLACTONE (ALDACTONE) 12.5 MG HALFTAB
12.5 mg | Freq: Every day | ORAL | Status: DC
Start: 2022-02-23 — End: 2022-02-27

## 2022-02-23 NOTE — Plan of Care
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 Presented with progressive exertional dyspnea for one [facility] , trying to find oneLives [address] [place] servicesCan ask friends for a rideUses a CPAP at home from J+LCase Management Screening  Flowsheet Row Most Recent Value Case Management Screening: Chart review completed. If YES to any question below then proceed to CM Eval/Plan  Is there a change in their cognitive function No Do you anticipate a change in this patient's physicial function that will effect discharge needs? No Has there been a readmission within the last 30 days No Were there services prior to admission ( Examples: Assisted Living, HD, Homecare, Extended Care Facility, Methadone, SNF, Outpatient Infusion Center) No Negative/Positive Screen Negative Screening: Case Management department will follow patient's progress and discuss plan of care with treatment team. If Applies:  Patient was identified during screening to be without a PCP and team updated during rounds. Case Manager Attestation  I have reviewed the medical record and completed the above screen. CM staff will follow patient's progress and discuss the plan of care with the Treatment Team. Yes  Assessment screening completed. Continue to follow patient's progress and discuss plan of care with treatment team.  No discharge needs identified at this time. Care Management will continue to follow with team.Toni [facility], RN [facility] Care Management DepartmentOffice- [phone]

## 2022-02-23 NOTE — Other
-  CONSULT  REQUEST  DOC[facility] NOTE-Type of consult: [facility]    -New Consult: [id number] Frank Bradley /Location: J191/Y782-95 / Brief Clinical Question: cardiac amyloid, heart failure, now hypotensive after diuretics vs cardiogenic shock.no chest pain or new arrhythmias./Callback Cell Phone: [phone] / Please confirm receipt of this message by texting back ?OK?-1 - Mobile Heartbeat message sent to Hsueh, C. at 6:08 PM. Received response at 1810.-Cleona Doubleday Montuori5/3/20236:07 [facility] [phone]

## 2022-02-23 NOTE — H&P
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 Presented with progressive exertional dyspnea for one week. Was seen by cardiology. Denies CP, no hypoxemia. HD stable. No fever chills. No respiratory congestion. No LE edema. Received Lasix 40 mg IV reports voiding multiple times, Volume not documented. Medical History: PMH PSH Past Medical History: Diagnosis Date ? Arrhythmia  ? Chronic coronary artery disease  ? Diabetes mellitus (HC Code) (HC CODE) (HC Code)  ? Encounter for blood transfusion  ? GERD (gastroesophageal reflux disease)  ? Hypercholesteremia  ? Hypertension  ? Obstructive sleep apnea  ? Syncope, psychogenic   Past Surgical History: Procedure Laterality Date ? CERVICAL DISC ARTHROPLASTY   ? TOOTH EXTRACTION   ? VALVE REPLACEMENT     Family History family history includes Diabetes in his brother, mother, and sister; Heart attack in his father; Heart disease in his father and mother; Heart failure in his mother; Stroke in his mother.Social History  reports that he has quit smoking. His smoking use included cigarettes. He has never used smokeless tobacco. He reports current alcohol use. He reports that he does not use drugs.Prior to Admission Medications Medications Prior to Admission Medication Sig Dispense Refill Last Dose ? allopurinoL (ZYLOPRIM) 300 mg tablet Take 450 mg by mouth daily.    ? amiodarone (PACERONE) 200 MG tablet Take 200 mg by mouth daily.. (Patient not taking: Reported on 04/25/2021)    ? aspirin 81 MG EC tablet Take 81 mg by mouth daily..  (Patient not taking: Reported on 04/25/2021)    ? bisoprolol (ZEBETA) 5 mg tablet Take 2.5 mg by mouth daily.    ? colchicine (COLCRYS) 0.6 mg tablet Take 0.6 mg by mouth 2 (two) times daily as needed.    ? DOCOSAHEXANOIC ACID/EPA (FISH OIL ORAL) Take 1 capsule by mouth daily.    ? furosemide (LASIX) 40 mg tablet Take 1 tablet (40 mg total) by mouth daily. 30 tablet 0  ? JARDIANCE 10 mg tablet Take 10 mg by mouth daily before breakfast.    ? lansoprazole (PREVACID) 30 MG capsule Take 30 mg by mouth daily as needed..     ? magnesium oxide (MAG-OX) 400 mg (241.3 mg magnesium) tablet Take 400 mg by mouth daily.    ? magnesium oxide 400 mg Cap Take by mouth daily..  (Patient not taking: Reported on 04/25/2021)    ? metFORMIN (GLUCOPHAGE) 500 MG Immediate Release tablet Take 500 mg by mouth 2 (two) times daily with breakfast and dinner..     ? metoprolol succinate (TOPROL-XL) 50 MG 24 hr tablet Take 50 mg by mouth daily..  (Patient not taking: Reported on 04/25/2021)    ? potassium chloride (MICRO-K) 10 MEQ CR capsule Take 20 mEq by mouth daily..     ? rivaroxaban (XARELTO) 20 mg Tab tablet 20 mg Daily @1700 ..     ? rosuvastatin (CRESTOR) 5 MG tablet Take 5 mg by mouth nightly..     ? tadalafiL, pulm. hypertension, (ADCIRCA) 20 mg Tab tablet TAKE 1 TABLET BY MOUTH DAILY AS NEEDED (Patient not taking: Reported on 04/25/2021)    ? valsartan (DIOVAN) 80 MG tablet Take 80 mg by mouth daily..  (Patient not taking: Reported on 04/25/2021)     Allergies Allergies Allergen  Reactions ? Atorvastatin  ? Penicillin G   Review of Systems: Review of Systems All other organ systems reviewed and are negativeObjective: Vitals:Last 24 hours: Temp:  [97.2 ?F (36.2 ?C)-97.6 ?F (36.4 ?C)] 97.6 ?F (36.4 ?C)Pulse:  [67-80] 67Resp:  [15-17] 15BP: (110-127)/(70-79) 110/79SpO2:  [93 %-100 %] 100 %Physical Exam: Physical ExamConstitutional:     General: He is not in acute distress.   Appearance: He is not ill-appearing, toxic-appearing or diaphoretic. Eyes:    General: No scleral icterus.   Conjunctiva/sclera: Conjunctivae normal. Cardiovascular:    Rate and Rhythm: Normal rate.    Heart sounds:   No friction rub. No gallop. Pulmonary:    Breath sounds: Rales present. No wheezing or rhonchi. Abdominal:    Palpations: Abdomen is soft.    Tenderness: There is no abdominal tenderness. There is no guarding. Musculoskeletal:    Right lower leg: No edema.    Left lower leg: No edema. Neurological:    General: No focal deficit present.    Mental Status: He is alert and oriented to person, place, and time. Psychiatric:       Mood and Affect: Mood normal.       Behavior: Behavior normal.  Labs: I have reviewed the patient's labs within the last 24 hrs.Diagnostics:XR Chest AP Portable (BH GH YH LM WH)Order: [id number] NarrativeCLINICAL DATA: ?Shortness of breath, hypertension EXAM: PORTABLE CHEST 1 VIEW COMPARISON: ?Chest radiograph 12/19/2021 FINDINGS: Median sternotomy wires and aortic valve prosthesis are stable. The cardiomediastinal silhouette is stable. There is no focal consolidation or pulmonary edema. Blunting of the right costophrenic angle is unchanged going back multiple prior studies. There is no significant pleural effusion. There is no pneumothorax. Overall, aeration is unchanged. The bones?are stable. IMPRESSION: Stable chest with no radiographic evidence of acute cardiopulmonary process. 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 Presented with progressive exertional dyspnea for one week. Was seen by cardiology. Denies CP, no hypoxemia. HD stable. No fever chills. No respiratory congestion. No LE edema. Received Lasix 40 mg IV reports voiding multiple times, Volume not documented. Vs stable, Tn 122-101-116 consistent with prior troponinemia, elevated BNP 2.711 from prior 1.6k, Scr is 1.8 from baseline around 1.2-1.5, Na 134, EKG stable, CBC stable. # Decompensated HFPlan: - monitor I/O daily weight- continue spironolactone, Bumex 4mg  IV in AM- continue Xarelto- monitor on telemetry- transfer to [facility] (was initially admitted to [facility])- follow TEE- consult Cardiology in AM- CPAP for OSA- ISS and ADA diet Code Status: FullVTE ppx XareltoMR: Reviewed Notifications: PCP: Pcp, Does Not Have A None    I have personally notified the patient's primary care provider of this admission. Yes  Family was notified of this admission. YesI have personally discussed the plan with the patient and/or family. YesI have reviewed the plan of care with the bedside nurse, including an emphasis on the most important aspects of care for the next 12 hours: yesAdvance Care Planning Advance Care Planning Patient has a living will: yesPatient has healthcare representative/proxy: yesPlease enter code [id number] if patient answers Yes or code [id number] if patient answers No to any of the above questions and you are not completing a 30 minute or more Advanced Care Planning encounter. Electronically Signed:Maekayla Giorgio Wilmon Arms, MD 02/22/2022, 6:34 PMAddendum: None

## 2022-02-23 NOTE — Significant Event
Patient developed hypotension, BP dropped to 60/40, now improved to 77/39 after one litre fluid bolus. HR 80S. No chest pain, or SOB. CXR no significant pulm edema on my read. EKG-afib, RBBB+Stat labs- trop, CBC, BMP, lactate ordered Plan:Likely volume related -over diuresis and received losartan this AM, which is not his home med. TTE EF 38%/ cardiogenic shock in the differential D/w MICU attnTransfer to MICU Ordered to start Levophed / target MAP>65. No further IVF. Hold anti HTN if any / hold diuretics C/w midodrine home med Son updated.

## 2022-02-23 NOTE — Pharmacy Discharge Note
PATIENT?S OWN MEDICATION CONSULTMedication name and formulation: tafamidis  (VYNDAMAX 61MG  CAP)Current home dose: 61 mg once dailyClinical indication: cardiac amyloidosisPatient own medication consult decision: Approved Reason for use: Failed formulary alternativesDate POM identified and re-labeled:  5/3/2023Expiration date of original medication (if available): 03/31/2024Initial medication count: #10Strength of medication dispensed: 61 mgManufacturer:PfizerLot Number: [id number] AAnticipated depletion date: 5/13/23Storage location: Patient specific bin in PyxisFor questions, please contact the pharmacist: Gillian Scarce, PharmD, 	Phone/Mobile Heartbeat: MHB

## 2022-02-23 NOTE — Plan of Care
Problem: Adult Inpatient Plan of CareGoal: Plan of Care ReviewOutcome: Interventions implemented as appropriateGoal: Patient-Specific Goal (Individualized)Outcome: Interventions implemented as appropriateGoal: Absence of Hospital-Acquired Illness or InjuryOutcome: Interventions implemented as appropriateGoal: Optimal Comfort and WellbeingOutcome: Interventions implemented as appropriateGoal: Readiness for Transition of CareOutcome: Interventions implemented as appropriate Plan of Care Overview/ Patient Status     Patient alert and oriented x4, received patient in bed resting comfortably. Denies pain or discomfort on first encounter. 1530: Returned back from break, blood pressure was noted to be low, diastolic of 30. Checked automatically and manually.  Dr. Sharene Skeans called and quickly came to bedside to evaluate, SWAT Nurse present. 1 litter bolus ordered and administered with no change in BP. Stat EKG done, stat blood work done. ICU medical team came to bedside to evaluate patient. Patient is opening bed in ICU for further evaluation. 	Patient appears more awake, last blood pressure noted 84/44. All safety precautions remains in place. Patient troponin level 154, call from [place] . From the lab, MD aware. Patient transferred to MICU for closer monitoring.

## 2022-02-23 NOTE — Plan of Care
Plan of Care Overview/ Patient Status    A/Ox4, on room air wears bipap at night, NSR with PVCs and occasional prolonged QT interval on telemetry, blood pressures running soft, Cardiac consistent carb diet, continent reports LBM 5/2, urinal at bedside, up independently. IV removed by IV team for leaking, asked not to have new one placed until morning, occasional shooting pain to right foot, received Tylenol, concerned about home medications on hold.

## 2022-02-24 LAB — CBC WITH AUTO DIFFERENTIAL
BKR WAM ABSOLUTE IMMATURE GRANULOCYTES.: 0.02 x 1000/ÂµL (ref 0.00–0.30)
BKR WAM ABSOLUTE LYMPHOCYTE COUNT.: 1.6 x 1000/ÂµL (ref 0.60–3.70)
BKR WAM ABSOLUTE NRBC (2 DEC): 0 x 1000/ÂµL (ref 0.00–1.00)
BKR WAM ANALYZER ANC: 3.46 x 1000/ÂµL (ref 2.00–7.60)
BKR WAM BASOPHIL ABSOLUTE COUNT.: 0.05 x 1000/ÂµL (ref 0.00–1.00)
BKR WAM BASOPHILS: 0.9 % (ref 0.0–1.4)
BKR WAM EOSINOPHIL ABSOLUTE COUNT.: 0.04 x 1000/ÂµL (ref 0.00–1.00)
BKR WAM EOSINOPHILS: 0.7 % (ref 0.0–5.0)
BKR WAM HEMATOCRIT (2 DEC): 45.8 % (ref 38.50–50.00)
BKR WAM HEMOGLOBIN: 15.2 g/dL (ref 13.2–17.1)
BKR WAM IMMATURE GRANULOCYTES: 0.4 % (ref 0.0–1.0)
BKR WAM LYMPHOCYTES: 28.8 % (ref 17.0–50.0)
BKR WAM MCH (PG): 29.1 pg (ref 27.0–33.0)
BKR WAM MCHC: 33.2 g/dL (ref 31.0–36.0)
BKR WAM MCV: 87.7 fL (ref 80.0–100.0)
BKR WAM MONOCYTE ABSOLUTE COUNT.: 0.39 x 1000/ÂµL (ref 0.00–1.00)
BKR WAM MONOCYTES: 7 % (ref 4.0–12.0)
BKR WAM MPV: 9.6 fL (ref 8.0–12.0)
BKR WAM NEUTROPHILS: 62.2 % (ref 39.0–72.0)
BKR WAM NUCLEATED RED BLOOD CELLS: 0 % (ref 0.0–1.0)
BKR WAM PLATELETS: 221 x1000/ÂµL (ref 150–420)
BKR WAM RDW-CV: 18.4 % — ABNORMAL HIGH (ref 11.0–15.0)
BKR WAM RED BLOOD CELL COUNT.: 5.22 M/ÂµL (ref 4.00–6.00)
BKR WAM WHITE BLOOD CELL COUNT: 5.6 x1000/ÂµL (ref 4.0–11.0)

## 2022-02-24 LAB — COMPREHENSIVE METABOLIC PANEL
BKR A/G RATIO: 0.9 — ABNORMAL LOW (ref 1.0–2.2)
BKR ALANINE AMINOTRANSFERASE (ALT): 9 U/L (ref 9–59)
BKR ALBUMIN: 3.9 g/dL (ref 3.6–4.9)
BKR ALKALINE PHOSPHATASE: 124 U/L — ABNORMAL HIGH (ref 9–122)
BKR ANION GAP: 14 (ref 7–17)
BKR ASPARTATE AMINOTRANSFERASE (AST): 42 U/L — ABNORMAL HIGH (ref 10–35)
BKR AST/ALT RATIO: 4.7
BKR BILIRUBIN TOTAL: 1.4 mg/dL — ABNORMAL HIGH (ref <=1.2)
BKR BLOOD UREA NITROGEN: 61 mg/dL — ABNORMAL HIGH (ref 8–23)
BKR BUN / CREAT RATIO: 33.9 — ABNORMAL HIGH (ref 8.0–23.0)
BKR CALCIUM: 9.9 mg/dL (ref 8.8–10.2)
BKR CHLORIDE: 93 mmol/L — ABNORMAL LOW (ref 98–107)
BKR CO2: 22 mmol/L (ref 20–30)
BKR CREATININE: 1.8 mg/dL — ABNORMAL HIGH (ref 0.40–1.30)
BKR EGFR, CREATININE (CKD-EPI 2021): 41 mL/min/{1.73_m2} — ABNORMAL LOW (ref >=60)
BKR GLOBULIN: 4.5 g/dL — ABNORMAL HIGH (ref 2.3–3.5)
BKR GLUCOSE: 169 mg/dL — ABNORMAL HIGH (ref 70–100)
BKR POTASSIUM: 4.3 mmol/L (ref 3.3–5.3)
BKR PROTEIN TOTAL: 8.4 g/dL (ref 6.6–8.7)
BKR SODIUM: 129 mmol/L — ABNORMAL LOW (ref 136–144)

## 2022-02-24 LAB — BASIC METABOLIC PANEL
BKR ANION GAP: 12 (ref 7–17)
BKR BLOOD UREA NITROGEN: 59 mg/dL — ABNORMAL HIGH (ref 8–23)
BKR BUN / CREAT RATIO: 32.8 — ABNORMAL HIGH (ref 8.0–23.0)
BKR CALCIUM: 9.1 mg/dL (ref 8.8–10.2)
BKR CHLORIDE: 95 mmol/L — ABNORMAL LOW (ref 98–107)
BKR CO2: 24 mmol/L (ref 20–30)
BKR CREATININE: 1.8 mg/dL — ABNORMAL HIGH (ref 0.40–1.30)
BKR EGFR, CREATININE (CKD-EPI 2021): 41 mL/min/{1.73_m2} — ABNORMAL LOW (ref >=60)
BKR GLUCOSE: 142 mg/dL — ABNORMAL HIGH (ref 70–100)
BKR POTASSIUM: 4.1 mmol/L (ref 3.3–5.3)
BKR SODIUM: 131 mmol/L — ABNORMAL LOW (ref 136–144)

## 2022-02-24 LAB — URINALYSIS WITH CULTURE REFLEX      (BH LMW YH)
BKR BILIRUBIN, UA: NEGATIVE
BKR BLOOD, UA: NEGATIVE
BKR KETONES, UA: NEGATIVE
BKR NITRITE, UA: NEGATIVE
BKR PH, UA: 6.5 (ref 5.5–7.5)
BKR SPECIFIC GRAVITY, UA: 1.011 (ref 1.005–1.030)
BKR UROBILINOGEN, UA (MG/DL): <2 mg/dL (ref <=2.0)

## 2022-02-24 LAB — URINE MICROSCOPIC     (BH GH LMW YH)
BKR RBC/HPF INSTRUMENT: 2 /HPF (ref 0–2)
BKR WBC/HPF INSTRUMENT: 35 /HPF — ABNORMAL HIGH (ref 0–5)

## 2022-02-24 LAB — PROCALCITONIN     (BH GH LMW Q YH): BKR PROCALCITONIN: 0.17 ng/mL

## 2022-02-24 LAB — NT-PROBNPE: BKR B-TYPE NATRIURETIC PEPTIDE, PRO (PROBNP): 1486 pg/mL — ABNORMAL HIGH (ref <125.0)

## 2022-02-24 LAB — TROPONIN T HIGH SENSITIVITY, 3 HOUR (BH GH LMW YH)
BKR TROPONIN T HS 1 HOUR DELTA FROM 0 HOUR ON 3HR: -14 ng/L
BKR TROPONIN T HS 3 HOUR DELTA FROM 0 HOUR: -14 ng/L
BKR TROPONIN T HS 3 HOUR: 140 ng/L — CR

## 2022-02-24 LAB — MAGNESIUM
BKR MAGNESIUM: 2.7 mg/dL — ABNORMAL HIGH (ref 1.7–2.4)
BKR MAGNESIUM: 2.6 mg/dL — ABNORMAL HIGH (ref 1.7–2.4)

## 2022-02-24 LAB — UA REFLEX CULTURE

## 2022-02-24 LAB — OSMOLALITY: BKR OSMOLALITY: 304 mosm/kg — ABNORMAL HIGH (ref 275–300)

## 2022-02-24 LAB — PHOSPHORUS     (BH GH L LMW YH)
BKR PHOSPHORUS: 4 mg/dL (ref 2.2–4.5)
BKR PHOSPHORUS: 3.9 mg/dL (ref 2.2–4.5)

## 2022-02-24 MED ORDER — INSULIN U-100 REGULAR HUMAN 100 UNIT/ML (SLIDING SCALE)
100 unit/mL | Freq: Before meals | SUBCUTANEOUS | Status: DC
Start: 2022-02-24 — End: 2022-02-24

## 2022-02-24 MED ORDER — LANSOPRAZOLE 15 MG CAPSULE,DELAYED RELEASE
15 mg | Freq: Every day | ORAL | Status: SS | PRN
Start: 2022-02-24 — End: 2022-06-30

## 2022-02-24 MED ORDER — INSULIN U-100 REGULAR HUMAN 100 UNIT/ML (SLIDING SCALE)
100 unit/mL | Freq: Four times a day (QID) | SUBCUTANEOUS | Status: DC
Start: 2022-02-24 — End: 2022-02-24

## 2022-02-24 MED ORDER — VUTRISIRAN 25 MG/0.5 ML SUBCUTANEOUS SYRINGE
25 mg/0.5 mL | SUBCUTANEOUS | Status: AC
Start: 2022-02-24 — End: ?

## 2022-02-24 MED ORDER — MIDODRINE 5 MG TABLET
5 mg | Freq: Three times a day (TID) | ORAL | Status: DC
Start: 2022-02-24 — End: 2022-02-25
  Administered 2022-02-24: 23:00:00 5 mg via ORAL

## 2022-02-24 MED ORDER — SODIUM CHLORIDE 0.9 % BOLUS (NEW BAG)
0.9 % | Freq: Once | INTRAVENOUS | Status: CP
Start: 2022-02-24 — End: ?
  Administered 2022-02-25: 01:00:00 0.9 mL/h via INTRAVENOUS

## 2022-02-24 MED ORDER — NOREPINEPHRINE BITARTRATE 4 MG/250 ML (16 MCG/ML) IN DEXTROSE 5 % IV
4 mg/250 mL (16 mcg/mL) | INTRAVENOUS | Status: DC
Start: 2022-02-24 — End: 2022-02-26
  Administered 2022-02-24 – 2022-02-25 (×2): 4 mL/h via INTRAVENOUS

## 2022-02-24 MED ORDER — TORSEMIDE 20 MG TABLET
20 mg | Freq: Every day | ORAL | Status: SS
Start: 2022-02-24 — End: 2022-02-26

## 2022-02-24 MED ORDER — POTASSIUM BICARBONATE-CITRIC ACID 20 MEQ EFFERVESCENT TABLET
20 mEq | ORAL | Status: DC | PRN
Start: 2022-02-24 — End: 2022-02-27

## 2022-02-24 MED ORDER — MIDODRINE 5 MG TABLET
5 mg | Freq: Three times a day (TID) | ORAL | Status: AC
Start: 2022-02-24 — End: 2022-02-26

## 2022-02-24 MED ORDER — MIDODRINE 5 MG TABLET
5 mg | Freq: Three times a day (TID) | ORAL | Status: DC
Start: 2022-02-24 — End: 2022-02-27
  Administered 2022-02-25 – 2022-02-26 (×7): 5 mg via ORAL

## 2022-02-24 NOTE — Plan of Care
Plan of Care Overview/ Patient Status    [name] is a 66 y.o. male. Pt with no pain at this time. Neuro: Pt is A&O, able to follow commands and make needs known. MAE. Reports tingling in his hands, states that this is not new. CV: Tmax 98.5. Cardiac monitoring continues, tracing NSR. HR 60s - 70s. BP 80s - 100s / 60s - 80s. Levo continues, titrated to maintain parameters as set forth in the [facility]. 2+ radial pulses. 1+ pedal pulses. Resp: Lungs are CTA. O2 sat WNL on RA. No cough, no c/o SOB, no respiratory distress noted. GI: Stomach is soft, non tender, with active bowel sounds. No BM this shift. Diet tolerated well. Pills whole with water. GU/Endo: Pt is able to use urinal at bedside independently. Urine yellow in color. Skin: See skin flowsheet. T+P q2h, wedge in use. Skin check performed. PUP to coccyx in place. Pt remains on pressure reduction mattress and heels elevated off bed. Access: PIV x2. Peripheral pressor running, site assessed per protocol, asymptomatic. Other: Safety checks completed Q1H. Bed alarm on for safety.   Vital SignsTemp: 97.9 ?F (36.6 ?C)Temp src: OralPulse: 71Heart Rate Source: MonitorResp: 14BP: 93/74NIBP MAP (Monitor/Manual Entry): 81NIBP MAP (mmHg) (calculated/READ ONLY): 81BP Location: Right armBP Method: AutomaticPatient Position: Lying Oxygen TherapySpO2: 100 %Device (Oxygen Therapy): room airOxygen Concentration (%): 21$Oxygen On/Off : Therapy continuedCheck SpO2 on Rest and Exertion: yesSpO2 on Room Air at Rest: (S) 97SpO2 on Room Air with Exertion: (S) 95Oxygen Device : (S) room air? norepinephrine 0.06 mcg/kg/min (02/24/22 1100) Willia Craze, RN5/4/202311:51 AMSee flowsheets, patient education and plan of care for additional information.  Problem: Adult Inpatient Plan of CareGoal: Plan of Care ReviewOutcome: Interventions implemented as appropriate Problem: Adult Inpatient Plan of CareGoal: Patient-Specific Goal (Individualized)Outcome: Interventions implemented as appropriate Problem: Adult Inpatient Plan of CareGoal: Absence of Hospital-Acquired Illness or InjuryOutcome: Interventions implemented as appropriate Problem: Adult Inpatient Plan of CareGoal: Optimal Comfort and WellbeingOutcome: Interventions implemented as appropriate Problem: Fall Injury RiskGoal: Absence of Fall and Fall-Related InjuryOutcome: Interventions implemented as appropriate

## 2022-02-24 NOTE — H&P
MICU History & PhysicalAttending: Leeroy Bock, MD LOS: 1 Code: Full Code Subjective Frank Bradley is a 66 y.o. 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 In the ED, vitals were significant for t97.6, hr67, rr15, bp110/79. Labs showed Na 134, K4.4, Cl 94, co2 27, bun/cr 53/1.82, BNP 2711, hstn 122 then downtrending; cbc wnl; received 40 mg IV Lasix, and was evaluated by the cardiology who recommended 4 mg IV Bumex for goal diuresis negative 1-2 L daily. transferred to the floor.On the floor received Bumex 4 (-1L), losartan 100mg , aldactone 25, dapagliflozin 5 and subsequently became hypotensive to 60/40s, symptomatic. EKG unchanged w/ afib, RBBB and PVCs, HR 70s. TTE showed non collapsible IVC, EF 38%, enlarged LV and RV, RVSP 44, severe TR. CXR showing no pulmonary edema, pneumothorax or interval airspace opacity. There is stable blunting of the right lateral costophrenic sulcus. Cardiomediastinal silhouette is stable, midline sternal wires are intact, status post AVR. Heart size within normal limits. Admitted to the ICU for levophed initiation. Received midodrine x2 w/o significant improvement; transferred to the MICU for initiation on pressorsICU courseUpon admission to the MICU, vitals were significant for T97.8, HR 60s, BP 66/43 (MAP 52), RR 19 spo2 95-100% RA. Levophed started. No LH, dizziness, new FND, dyspnea, chest discomfort, abdominal or LE swelling. No sick symptoms.Past Medical History: Diagnosis Date ? Arrhythmia  ? Chronic coronary artery disease  ? Diabetes mellitus (HC Code)  ? Encounter for blood transfusion  ? GERD (gastroesophageal reflux disease)  ? Hypercholesteremia  ? Hypertension  ? Obstructive sleep apnea  ? Syncope, psychogenic   Past Surgical History: ? CERVICAL DISC ARTHROPLASTY ? TOOTH EXTRACTION ? VALVE REPLACEMENT  Family History Problem Relation Age of Onset ? Heart disease Mother  ? Diabetes Mother  ? Stroke Mother  ? Heart failure Mother  ? Heart disease Father  ? Heart attack Father  ? Diabetes Sister  ? Diabetes Brother  Allergies Allergen Reactions ? Penicillin G Swelling ? Atorvastatin   Social History Tobacco Use Smoking Status Former ? Types: Cigarettes Smokeless Tobacco Never Tobacco Comments  Quit early 1980's Social History Substance and Sexual Activity Alcohol Use Yes Social History Substance and Sexual Activity Drug Use No  has no history on file for sexual activity.  Medications Home Medications:Scheduled Meds: Current Facility-Administered Medications Medication Dose Route Frequency Provider Last Rate Last Admin ? [START ON 02/24/2022] allopurinol (ZYLOPRIM) 150 mg, allopurinoL (ZYLOPRIM) 300 mg  450 mg Oral Daily Chinnugounder, Sankar, MD     ? dapagliflozin (FARXIGA) tablet 5 mg  5 mg Oral Daily Chinnugounder, Sankar, MD   5 mg at 02/23/22 1247 ? insulin lispro (Admelog, HumaLOG) Sliding Scale (See admin instructions for dose) 1-18 Units  1-18 Units Subcutaneous TID AC Derry Skill, MD     ? insulin lispro (Admelog, HumaLOG) Sliding Scale (See admin instructions for dose) 1-18 Units  1-18 Units Subcutaneous Nightly Derry Skill, MD     ? metFORMIN (GLUCOPHAGE) Immediate Release tablet 500 mg  500 mg Oral BID WC Chinnugounder, Sankar, MD     ? midodrine (PROAMATINE) tablet 5 mg  5 mg Oral BID WC Chinnugounder, Sankar, MD   5 mg at 02/23/22 1651 ? polyethylene glycol (MIRALAX) packet 17 g  17 g Oral Daily Chinnugounder, Sankar, MD   17 g at 02/23/22 1246 ? rivaroxaban (XARELTO) tablet  15 mg  15 mg Oral Daily (1700) Chinnugounder, Sankar, MD   15 mg at 02/23/22 1731 ? rosuvastatin (CRESTOR) tablet 5 mg  5 mg Oral Nightly Chinnugounder, Sankar, MD     ? senna (SENOKOT) tablet 8.6 mg  1 tablet Oral Nightly Chinnugounder, Sankar, MD     ? sodium chloride 0.9 % (new bag) bolus 500 mL  500 mL Intravenous Once Chinnugounder, Sankar, MD     ? sodium chloride 0.9 % flush 3 mL  3 mL IV Push Q8H Derry Skill, MD   3 mL at 02/22/22 2050 ? [Held by provider] spironolactone (ALDACTONE) tablet 12.5 mg  12.5 mg Oral Daily Chinnugounder, Sankar, MD     ? tafamidis (VYNDAMAX) capsule 61 mg  61 mg Oral Daily @0800  Chinnugounder, Sankar, MD   61 mg at 02/23/22 1247  Continuous Infusions: ? norepinephrine 0.02 mcg/kg/min (02/23/22 1934)  PRN Meds: dextrose (GLUCOSE) oral liquid 15 g **OR** fruit juice **OR** skim milk, dextrose (GLUCOSE) oral liquid 30 g **OR** fruit juice, dextrose injection, dextrose injection, glucagon, sodium chloride Held Home Medications:Objective  Temp:  [97.4 ?F (36.3 ?C)-98.3 ?F (36.8 ?C)] 97.8 ?F (36.6 ?C)Pulse:  [56-89] 71Resp:  [15-20] 19BP: (58-115)/(30-79) 71/48SpO2:  [95 %-100 %] 95 % on RA  Daily Weights: Last 3 Weights  02/22/22 1800 02/23/22 0603 02/23/22 1911 Weight (lbs): 170.35 Lbs. 182.8 Lbs. 163.36 Lbs.  I/O's:Intake/Output Summary (Last 24 hours) at 02/23/2022 1947Last data filed at 02/23/2022 0110Gross per 24 hour Intake -- Output 725 ml Net -725 ml  Physical Exam:Constitutional: A&Ox3. NAD.Cardiovascular:RRR, No MRG, no LE edema, no JVDPulmonary/Chest: Effort normal. LCTAB. No wheezes, rhonchi. Mild L>R rales in lower lung fieldsAbdominal: Soft. Bowel sounds are normal. No distension. No TTPNeurological: CNII-XII intact. Responding appropriately to questions, moving all extremitiesSkin: Skin is warm and dryPertinent Labs: (02/23/2022)Recent Labs Lab 05/02/231236 05/03/231639 WBC 5.9 5.4 HGB 14.4 12.9* HCT 52.0 - 43.30 39.70 PLT 224 208  Recent Labs Lab 05/02/231236 NEUTROPHILS 59.8 Recent Labs Lab 05/02/231236 05/03/230420 05/03/230946 05/03/231638 05/03/231720 NA 134* 130*  --  130*  --  K 4.3 - 4.4 4.0  --  4.8  --  CL 94* 92*  --  95*  --  CO2 27 26  --  24  --  BUN 53* 59*  --  59*  --  CREATININE 1.7* - 1.82* 1.80*  --  2.20*  --  GLU 93 - 89 87   < > 112* 80 ANIONGAP 13 12  --  11  --   < > = values in this interval not displayed.  Recent Labs Lab 05/02/231236 05/03/230420 05/03/231638 CALCIUM 10.4* 9.6 9.2 MG 2.7* 2.5*  --   Recent Labs Lab 05/02/231236 05/03/230420 ALT 15 11 AST 49* 40* ALKPHOS 121 112 BILITOT 1.3* 1.1 BILIDIR 0.6*  --  PROT 8.7 7.7 ALBUMIN 4.3 3.9  No results for input(s): PTT, LABPROT, INR in the last 168 hours. Other Labs:ABG: No results for input(s): PHART, PCO2ART, PO2ART, HCO3ART, O2SATART, LITERFLOW in the last 168 hours.VBG: No results for input(s): PHVEN, PCO2VEN, PO2VEN, HCO3VEN, O2SATVEN, LITERFLOW in the last 168 hours.LACTATE: Recent Labs   05/03/231639 LACTATE 1.4 Micro:No results found for: LABURIN, LABBLOODiagnostics:XR Chest PA or AP (Portable)Result Date: 02/23/2022 No pulmonary edema, pneumothorax or interval airspace opacity. 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 #sedation-not req sedation, mental status intactCardiovascular. 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 Patient currently with MAPs >65 and is requiring pressors.- wean levophed as tolerated for MAP >65- continue midrodrine (orthostatic hypotension)- avoid IVF, holding anti-HTN as below- continue home tafamidis Rhythm: AFib s/p ablation (xarelto)- continue xarelto- continuous telemetry- ensure K> 4.0, Mg>2Vessel: CAD s/p CABG- Continue home statin#Hypertension: Bps improved on pressors- pressors as above- Hold home HTNsive medicationPulmonary. Admitted for ADHF now euvolemic and without clinical evidence of significant pulmonary congestion- CTM- strict I+Os as belowInfectious Disease#r/o infecious processUndifferentiated shock, likely medication-induced as above, no focal findings to suggest infectious etiology however will pursue infectious workup while monitoring response to pressors/holding antiHTN medications- hold off on abx pending workup- F/u Bcx, UcxGastrointestinal.#nutrition- cardiac diet#Bowel regimen- miralax/senna#GI prophylaxis- stress ulcer ppx not indicatedRenal.#AKI on CKD likely pre-renal iso hypovolemia vs cardiorenal iso ADHF, CTM iso improved MAP- Daily BMP- Strict I/Os- daily wtsElectrolytes- Replete K > 4.0, Phos > 3.0, Mg > 2.0Endocrine.#glycemic control #DMIIHome regimen is jardiance, metformin. Last HbA1c was 6.7.- Hold home regimen while inpatient- low-dose ISS w/ POCT glucose TIDAC#gout- cont allopurinolHematology / Oncology.#DVT prophylaxis- SQ heparin iso AKIGOC- Code status full- Daily family updatesPreliminary assessment and plan, please see Attending Physician  addendum and/or separate note for final recommendations. Charlean Sanfilippo, MDPGY1 IM/YPC5/12/2021 7:47 PM

## 2022-02-24 NOTE — Progress Notes
[facility] Progress NoteAttending Provider: Rebekah Chesterfield, MD Subjective                                                                              Subjective: Interim History: Seen multiple times. Pt was doing okay this AM. Now developed hypotension. Denies chest pain, SOB, abdomen pain, back pain. Feels not well. Review of Allergies/Meds/Hx: Review of Allergies/Meds/Hx:I have reviewed the patient's: allergies, current scheduled medications, current infusions, current prn medications, past medical history, past surgical history, family history, social history and prior to admission medications Objective Objective: Vitals:Last 24 hours: Temp:  [97.5 ?F (36.4 ?C)-98.3 ?F (36.8 ?C)] 98.3 ?F (36.8 ?C)Pulse:  [58-89] 58Resp:  [18-20] 18BP: (60-115)/(30-79) 60/30SpO2:  [99 %-100 %] 99 %I/O's:Gross Totals (Last 24 hours) at 02/23/2022 1535Last data filed at 02/23/2022 0110Intake -- Output 975 ml Net -975 ml Procedures:None Physical ExamHENT:    Nose: No rhinorrhea. Neck:    Comments: Neck pulsatileCardiovascular:    Rate and Rhythm: Rhythm irregular.    Heart sounds: Normal heart sounds. Pulmonary:    Effort: No respiratory distress.    Breath sounds: Normal breath sounds. No wheezing. Abdominal:    General: There is no distension.    Palpations: Abdomen is soft.    Tenderness: There is no abdominal tenderness. Musculoskeletal:    Right lower leg: No edema.    Left lower leg: No edema.    Comments: Cold UE  Skin:   General: Skin is dry. Neurological:    Mental Status: He is alert and oriented to person, place, and time. Psychiatric:       Mood and Affect: Mood normal.  Labs:Last 24 hours: Recent Results (from the past 24 hour(s)) Troponin T High Sensitivity, 3 Hour (BH GH LMW YH)  Collection Time: 02/22/22  5:11 PM Result Value Ref Range  High Sensitivity Troponin T 116 (HH) See Comment ng/L  1 hour Delta from 0 Hour, HS-Troponin T -21 ng/L  3 hour Delta from 0 Hour, HS-Troponin T -6 ng/L Magnesium  Collection Time: 02/23/22  4:20 AM Result Value Ref Range  Magnesium 2.5 (H) 1.7 - 2.4 mg/dL Comprehensive metabolic panel  Collection Time: 02/23/22  4:20 AM Result Value Ref Range  Sodium 130 (L) 136 - 144 mmol/L  Potassium 4.0 3.3 - 5.3 mmol/L  Chloride 92 (L) 98 - 107 mmol/L  CO2 26 20 - 30 mmol/L  Anion Gap 12 7 - 17  Glucose 87 70 - 100 mg/dL  BUN 59 (H) 8 - 23 mg/dL  Creatinine 1.61 (H) 0.96 - 1.30 mg/dL  Calcium 9.6 8.8 - 04.5 mg/dL  BUN/Creatinine Ratio 40.9 (H) 8.0 - 23.0  Total Protein 7.7 6.6 - 8.7 g/dL  Albumin 3.9 3.6 - 4.9 g/dL  Total Bilirubin 1.1 <=8.1 mg/dL  Alkaline Phosphatase 191 9 - 122 U/L  Alanine Aminotransferase (ALT) 11 9 - 59 U/L  Aspartate Aminotransferase (AST) 40 (H) 10 - 35 U/L  Globulin 3.8 (H) 2.3 - 3.5 g/dL  A/G Ratio 1.0 1.0 - 2.2  AST/ALT Ratio 3.6 See Comment  eGFR (Creatinine) 41 (L) >=60 mL/min/1.74m2 Echo 2D Ltd w Doppler and CFI if Ind Image Enhancement and or 3D  Collection Time:  02/23/22  9:37 AM Result Value Ref Range  Reported Biplane EF% 38 % POC Glucose (Fingerstick)  Collection Time: 02/23/22  9:46 AM Result Value Ref Range  Glucose, Meter 97 70 - 100 mg/dL POC Glucose (Fingerstick)  Collection Time: 02/23/22 12:40 PM Result Value Ref Range  Glucose, Meter 97 70 - 100 mg/dL POC Glucose (Fingerstick)  Collection Time: 02/23/22  3:28 PM Result Value Ref Range  Glucose, Meter 105 (H) 70 - 100 mg/dL EKG  Collection Time: 09/81/19  3:35 PM Result Value Ref Range  Heart Rate 80 bpm  QRS Interval 170 ms  QT Interval 476 ms  QTC Interval 550 ms  P Axis    QRS Axis 61 deg  T Wave Axis -7 deg  P-R Interval    SEVERITY Abnormal ECG severity Diagnostics:No new radiology.ECG/Tele Events: No ECG ordered todayTTE 02/23/22 * Limited Echocardiogram~ * Moderately decreased left ventricular systolic function. The left ventricular ejection fraction calculated by biplane Simpson's was 38%.  No thrombus visualized in the left ventricle. Moderately increased left ventricular cavity size.  Severe concentric left ventricular hypertrophy.  Flattened septum in systole consistent with right ventricular pressure overload.~ * Moderately increased right ventricular cavity size.  Mildly decreased right ventricular systolic function.  Estimated right ventricular systolic pressure is 44 mmHg compatible with pulmonary hypertension. The right ventricular systolic pressure may be underestimated.~ * A bioprosthetic aortic valve is present.  Spectral doppler was not performed across the aortic valve on this study.~ * Mild mitral regurgitation.~ * Severe tricuspid regurgitation.~ * Hepatic vein flow shows systolic flow reversal.  IVC diameter > 2.1 cm that collapses < 50% with a sniff suggests high RAP (10-20 mmHg, mean 15 mmHg).~ * No significant pericardial effusion.~ * No prior transthoracic study available for comparison. 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 Interim:Hypotensive 60/30 this AN. No chest pain or SOB but feeling dizzy.  S/p 1 L fluid bolus, BP improved to 84/50. EKG -afib/RBBBCXR -no florid pulm edema.  Plan Plan: Hypotension:New event after admission, likely related to over diuresis and anti HTN meds/received losartan this AM, not his home med. Patient also takes midodrine 5 mg BID at home. EF 38%, cardiogenic shock in the differential. TTE this AM- no tamponade. Do not suspect massive PE, pt is on Xarelto for afib. F/up CBC to rule out new anemia/bleed; low suspicion. Hold anti HTN med/ diuretics for now. Levophed ordered, patient will be transferred to MICU for further care. F/up Trops, BMP. Spoke to cardiology, will see pt. Cardiac amyloidosis/ CAD/AfibEF 38%, severe LVH, mod decreased LV syst fn, RVSP 44, severe TR, RAP est 15, bioprosthetic AV+. No pericardial effusion. C/w vyndamax Hold diuretics, anti HTN meds for now. C/w statin. Not taking any BB at home. Tadalafil takes as needed - erectile dysfn? Do not think for pulm HTNC/w Xarelto, renal dose Amyloid neuropathy, orthostatic hypotension:C/w vyndamax and midodrineDM:C/w farxiga in place of jardiance Start insulin s/s JYN:WGNFAOZH:Y/Q allopurinol, takes colchicine as needed,. CKD:Monitor BMP, IO, daily weights. Avoid nephrotoxic medications. Full codeSon updated Transfer to MICU.  Electronically Signed:Beverley Sherrard, MD Beeper 02/23/2022, 3:33 PM

## 2022-02-24 NOTE — Other
PHARMACY-ASSISTED MEDICATION REPORTPharmacist review of the best possible medication history obtained by the pharmacy medication history technician has been performed.  I have updated the home medication list and identified the following information that may be relevant to this admission.NOTES/RECOMMENDATIONS Rivaroxaban for AF, currently renally dosed from 20mg  QD to 15 QD iso AKI. Will need to adjust dose CrCl >70mL/minClarified with patient's outpatient pharmacy that he receives generic tadalifil and not brand Adcira. Likely for using it for erectile dysfunction given PRN indication and no hx of PAH on chart review.       Prior to Admission Medications Medication Name Sig Taking? Patient Reported   allopurinoL (ZYLOPRIM) 300 mg tabletLast dose:  --  Take 1 tablet (300 mg total) by mouth daily. Yes Yes   Discontinued: 02/24/2022 11:38 AMLast dose: Not TakingLast Medication Note: >> William Hamburger Feb 23, 2022 12:18 PM Entered by Macarthur Critchley, CPHT Wed Feb 23, 2022 1218   Yes   Discontinued: 02/24/2022 11:37 AMLast dose: Not TakingLast Medication Note: >> William Hamburger Feb 23, 2022 12:18 PM Entered by Macarthur Critchley, CPHT Wed Feb 23, 2022 1218   Yes   Discontinued: 02/24/2022 11:38 AMLast dose: Not TakingLast Medication Note: >> Leory Plowman Feb 24, 2022 11:22 AMMedHX Tech(Alexa Moutinho, CPHT):  Flag for removal : Patient reports doctor discontinued  Entered by Geoffry Paradise, PharmD Thu Feb 24, 2022 1122   Yes   colchicine (COLCRYS) 0.6 mg tabletLast dose:  -- Last Medication Note: >> Pearline Cables Feb 23, 2022  2:51 PMMedHX Tech(Alexa Moutinho, CPHT):  patient reports taking as needed Entered by Suan Halter, CPHT Wed Feb 23, 2022 1451 Take 1 tablet (0.6 mg total) by mouth 2 (two) times daily as needed. Yes Yes   DOCOSAHEXANOIC ACID/EPA (FISH OIL ORAL)Last dose: Not TakingLast Medication Note: >> Lynelle Doctor Apr 25, 2021 12:07 PM Entered by Edison Simon, CPHT Sun Apr 25, 2021 1207 Take 1 capsule by mouth daily.Patient not taking: Reported on 02/23/2022   Yes   Discontinued: 02/24/2022 11:38 AMLast dose:  --        JARDIANCE 10 mg tabletLast dose:  --  Take 1 tablet (10 mg total) by mouth daily before breakfast. Yes Yes   lansoprazole (PREVACID) 15 mg capsuleLast dose:  --  Take 1 capsule (15 mg total) by mouth daily as needed.   Yes   Discontinued: 02/24/2022 11:44 AMLast dose:  -- Last Medication Note: >> Pearline Cables Feb 23, 2022  2:52 PMMedHX Tech(Alexa Moutinho, CPHT):  patient reports as neededEntered by Suan Halter, CPHT Wed Feb 23, 2022 1452 Yes Yes   magnesium oxide (MAG-OX) 400 mg (241.3 mg magnesium) tabletLast dose:  --  Take 1 tablet (400 mg total) by mouth daily. Yes Yes   Discontinued: 02/24/2022 11:38 AMLast dose:  -- Last Medication Note: >> Lynelle Doctor Apr 25, 2021 12:08 PMMedHx Tech(Jheellyne Amio, CPHT): FLAG FOR REMOVAL -  Patient is taking tabletEntered by Edison Simon, CPHT Sun Apr 25, 2021 1208   Yes   metFORMIN (GLUCOPHAGE) 500 MG Immediate Release tabletLast dose:  -- Last Medication Note: >> Lynelle Doctor Apr 25, 2021 12:08 PM Entered by Edison Simon, CPHT Sun Apr 25, 2021 1208 Take 1 tablet (500 mg total) by mouth 2 (two) times daily with breakfast and dinner. Yes Yes   Discontinued: 02/24/2022 11:38 AMLast dose: Not TakingLast Medication Note: >> AMIO,  Sharrie Rothman Apr 25, 2021 12:09 PMMedHx Tech(Jheellyne Amio, CPHT): FLAG FOR REMOVAL -  Patient reports now taking Bisoprol, see addedEntered by Edison Simon, CPHT Sun Apr 25, 2021 1209   Yes   midodrine (PROAMATINE) 5 mg tabletLast dose:  --  Take 1 tablet (5 mg total) by mouth 3 (three) times daily with meals. Do not take any doseslater than evening meal or less than 4 hours before bedtime.   Yes multivitamin capsuleLast dose:  --  Take 1 capsule by mouth daily. Yes Yes   Discontinued: 02/24/2022 11:39 AMLast dose: Not TakingLast Medication Note: >> Pearline Cables Feb 23, 2022  3:03 PM Entered by Suan Halter, CPHT Wed Feb 23, 2022 1503   Yes   rivaroxaban (XARELTO) 20 mg Tab tabletLast dose:  -- Last Medication Note: >> Lynelle Doctor Apr 25, 2021 12:10 PM Entered by Edison Simon, CPHT Sun Apr 25, 2021 1210 1 tablet (20 mg total) Daily @1700 . Yes Yes   rosuvastatin (CRESTOR) 5 MG tabletLast dose:  -- Last Medication Note: >> Lynelle Doctor Apr 25, 2021 12:11 PM Entered by Edison Simon, CPHT Sun Apr 25, 2021 1211 Take 1 tablet (5 mg total) by mouth nightly. Yes Yes   spironolactone (ALDACTONE) 25 mg tabletLast dose:  --  Take 0.5 tablets (12.5 mg total) by mouth daily. Yes Yes   tadalafiL, pulm. hypertension, (ADCIRCA) 20 mg Tab tabletLast dose:  -- Last Medication Note: >> Pearline Cables Feb 23, 2022  2:54 PMMedHX Tech(Alexa Moutinho, CPHT):  Patient reports taking as neededEntered by Suan Halter, CPHT Wed Feb 23, 2022 1454 TAKE 1 TABLET BY MOUTH DAILY AS NEEDED Yes Yes   tafamidis (VYNDAMAX) 61 mg capsuleLast dose:  --  Take 1 capsule (61 mg total) by mouth daily. Yes Yes   Discontinued: 04/25/2021 12:50 PMLast dose:  --    Yes   torsemide (DEMADEX) 20 mg tabletLast dose:  --  Take 2 tablets (40 mg total) by mouth daily.   Yes   Discontinued: 02/24/2022 11:39 AMLast dose:  -- Last Medication Note: >> Lynelle Doctor Apr 25, 2021 12:12 PMMedHx Tech(Jheellyne Amio, CPHT): FLAG FOR REMOVAL -  Patient reports now taking Telmisartan, see addedEntered by Edison Simon, CPHT Sun Apr 25, 2021 1212   Yes   vutrisiran (AMVUTTRA) 25 mg/0.5 mL Syrg syringeLast dose: 4/20/2023Last Medication Note: >> Leory Plowman Feb 24, 2022 11:37 AMHistorical med - last [facility] on 4/20/23Entered by Geoffry Paradise, PharmD Thu Feb 24, 2022 1137 Inject 25 mg under the skin every 3 (three) months.   Yes   Prior to admission medications last reviewed by Suan Halter, CPHT on Wed Feb 23, 2022 1506 Thank you,Williette Loewe, PharmD5/4/202312:03 PMPhone: M[facility]

## 2022-02-24 NOTE — Other
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  His most recent echo had been performed in February 2023.  At that time his EF was 45-50%.  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  He states he is also on a 2nd amyloid medication, but is unsure of the name.  He is previously been noted to have orthostatic hypotension and was on midodrine 5 mg t.i.d. since December.  He states he also had EMG testing which was abnormal.He recently moved to [place] and was about to establish care at [facility] in infiltr[facility] cardiomyopathy [facility], but developed worsening shortness of breath on exertion even with walking to the bathroom which prompted him to come to the ED yesterday.  He denies orthopnea, lower extremity swelling, chest pain, palpitations.He was admitted to the medicine floors, had receive Bumex with-1 L urine output, no input recorded.  He had been given 100 mg losartan this morning, but he states he does not take this at home nor does he take Diovan as listed in his home medication.  He believes that an old medication list was used.  He became hypotensive later on today 60s/40s HR 70s, with lightheadedness and nausea.  He received 1 L of fluids as well as 5 mg midodrine and now feels significantly improved.  He was ordered Levophed but this was not started.  EKG notable for AFib, right bundle-branch block PVCs, overall unchanged compared to prior.  Review of telemetry does not demonstrate any significant arrhythmia besides the AFib, though notably his heart rate was in the 70s throughout the afternoon.  TTE this morning had demonstrated a non collapsible IVC that was enlarged, EF 38%, enlarged LV and RV, RVSP 44, severe TR.  Blood work notable for up trending creatinine to now 2.2, lactate 1.4, troponin 154.  Repeat hemoglobin 12.9.On exam he is currently talking on the phone and eating dinner and feels significantly better compared to prior.  Denies chest pain, palpitations, shortness of breath currently.  He was able to walk to the bathroom earlier this morning without the recurrent shortness of breath.Cardiac Studies TTE ~ * Limited Echocardiogram~ * Moderately decreased left ventricular systolic function. The left ventricular ejection fraction calculated by biplane Simpson's was 38%.  No thrombus visualized in the left ventricle. Moderately increased left ventricular cavity size.  Severe concentric left ventricular hypertrophy.  Flattened septum in systole consistent with right ventricular pressure overload.~ * Moderately increased right ventricular cavity size.  Mildly decreased right ventricular systolic function.  Estimated right ventricular systolic pressure is 44 mmHg compatible with pulmonary hypertension. The right ventricular systolic pressure may be underestimated.~ * A bioprosthetic aortic valve is present.  Spectral doppler was not performed across the aortic valve on this  study.~ * Mild mitral regurgitation.~ * Severe tricuspid regurgitation.~ * Hepatic vein flow shows systolic flow reversal. IVC diameter > 2.1 cm that collapses < 50% with a sniff suggests high RAP (10-20 mmHg, mean 15 mmHg).~ * No significant pericardial effusion.~ * No prior transthoracic study available for comparison.Cardiac studies from care everywhere: - cMRI 11/30/221. Mild LVE with moderate LVH diffuse hypokinesis EF 40%2. Markedly abnormal post gadolinium images with 41.8% myocardialuptake over all axial slices Although there is a subendocardial andmid myocardial predominance and worse uptake in the basal segmentsthere is also transmural uptake in some areas not contained to acoronary distribution3. Degenerative appearing Bioprosthetic AVR with mild appearing ARand thickened leaflets4. Mild RVE/RV hypokinesis RVEF 46%- PYP Scan 11/03/20 Ratio 2.8 Markedly positive for TTR Cardiac Medications Inpatient Medications: Current Facility-Administered Medications Medication Dose Route Frequency Provider Last Rate Last Admin ? [START ON 02/24/2022] allopurinol (ZYLOPRIM) tablet 450 mg  450 mg Oral Daily Chinnugounder, Sankar, MD     ? dapagliflozin (FARXIGA) tablet 5 mg  5 mg Oral Daily Chinnugounder, Sankar, MD   5 mg at 02/23/22 1247 ? insulin lispro (Admelog, HumaLOG) Sliding Scale (See admin instructions for dose) 1-18 Units  1-18 Units Subcutaneous TID AC Derry Skill, MD     ? insulin lispro (Admelog, HumaLOG) Sliding Scale (See admin instructions for dose) 1-18 Units  1-18 Units Subcutaneous Nightly Derry Skill, MD     ? metFORMIN (GLUCOPHAGE) Immediate Release tablet 500 mg  500 mg Oral BID WC Chinnugounder, Sankar, MD     ? midodrine (PROAMATINE) tablet 5 mg  5 mg Oral BID WC Chinnugounder, Sankar, MD   5 mg at 02/23/22 1651 ? norepinephrine bitartrate-D5W (LEVOPHED) 4 mg/250 mL (16 mcg/mL) infusion Soln          ? polyethylene glycol (MIRALAX) packet 17 g  17 g Oral Daily Chinnugounder, Sankar, MD   17 g at 02/23/22 1246 ? rivaroxaban (XARELTO) tablet Tab 15 mg  15 mg Oral Daily (1700) Chinnugounder, Sankar, MD   15 mg at 02/23/22 1731 ? rosuvastatin (CRESTOR) tablet 5 mg  5 mg Oral Nightly Chinnugounder, Sankar, MD     ? senna (SENOKOT) tablet 8.6 mg  1 tablet Oral Nightly Chinnugounder, Sankar, MD     ? sodium chloride 0.9 % (new bag) bolus 500 mL  500 mL Intravenous Once Chinnugounder, Sankar, MD     ? sodium chloride 0.9 % flush 3 mL  3 mL IV Push Q8H Derry Skill, MD   3 mL at 02/22/22 2050 ? [Held by provider] spironolactone (ALDACTONE) tablet 12.5 mg  12.5 mg Oral Daily Chinnugounder, Sankar, MD     ? tafamidis (VYNDAMAX) capsule 61 mg  61 mg Oral Daily @0800  Chinnugounder, Sankar, MD   61 mg at 02/23/22 1247  Infusions: ? norepinephrine    PRN Meds: dextrose (GLUCOSE) oral liquid 15 g **OR** fruit juice **OR** skim milk, dextrose (GLUCOSE) oral liquid 30 g **OR** fruit juice, dextrose injection, dextrose injection, glucagon, sodium chloride I&O, Vitals, Physical Exam Vitals:Vitals:  02/23/22 1722 02/23/22 1751 02/23/22 1818 02/23/22 1840 BP: (!) 82/44 (!) 77/55 100/62 (!) 78/48 Pulse: 60 61 74 64 Resp: 18 18 18 20  Temp:     TempSrc:     SpO2: 100% 99% 100% 100% Weight:     Height:     I/O's last 24 hours:Gross Totals (Last 24 hours) at 02/23/2022 1859Last data filed at 02/23/2022 0110Intake -- Output 725 ml Net -725 ml  Weights:Admission Weight: 77.3 kg (170 lb 5.6 oz)Last 3  Weights  02/22/22 1800 02/23/22 0603 Weight (lbs): 170.35 Lbs. 182.8 Lbs.  Physical exam:Constitutional: NADCV:  Irregular, S1, S2, 2/6 systolic murmur, elevated JVP (but this is also in the setting of severe tricuspid regurgitation), no pitting edema, WWPPulm: CTAB, no wheezes, rhonchi or cracklesAbd: soft, NT, NDSkin: no rashesNeuro: A&Ox3, no focal deficitsPsych: euthymic mood and affect Other History Active Problems:Patient Active Problem List  Diagnosis  ? Hypotension  ? Decompensated heart failure (HC Code) (HC CODE) (HC Code)  ? Acute systolic CHF (congestive heart failure) (HC Code) (HC CODE) (HC Code)  ? Shortness of breath  ? Elevated troponin  ? Typical atrial flutter (HC Code) (HC CODE) (HC Code)  ? Atrial fibrillation, unspecified type (HC Code) (HC CODE) (HC Code)  ? Coronary artery disease involving native coronary artery of native heart without angina pectoris  ? S/P cryoablation of arrhythmia    10/07/16: Cryoablation/PVI and RFA of baseline AFLutterAblated to sinusThen transseptal to LAIsolated PVs ? S/P CABG (coronary artery bypass graft)  ? Type 2 diabetes mellitus (HC Code)  ? Sleep apnea  ROS: negative for 12 systems except in HPIAllergies: Allergies Allergen Reactions ? Penicillin G Swelling ? Atorvastatin  Past Medical History: Diagnosis Date ? Arrhythmia  ? Chronic coronary artery disease  ? Diabetes mellitus (HC Code)  ? Encounter for blood transfusion  ? GERD (gastroesophageal reflux disease)  ? Hypercholesteremia  ? Hypertension  ? Obstructive sleep apnea  ? Syncope, psychogenic   Past Surgical History: Procedure Laterality Date ? CERVICAL DISC ARTHROPLASTY   ? TOOTH EXTRACTION   ? VALVE REPLACEMENT    Social History Tobacco Use ? Smoking status: Former   Types: Cigarettes ? Smokeless tobacco: Never ? Tobacco comments:   Quit early 1980's Substance Use Topics ? Alcohol use: Yes  Family History Problem Relation Age of Onset ? Heart disease Mother  ? Diabetes Mother  ? Stroke Mother  ? Heart failure Mother  ? Heart disease Father  ? Heart attack Father  ? Diabetes Sister  ? Diabetes Brother   Other Labs  [facility] 05/02/231236 05/03/231639 WBC 5.9 5.4 HGB 14.4 12.9* HCT 52.0 - 43.30 39.70 PLT 224 208  LFTsRecent Labs Lab 05/02/231236 05/03/230420 ALT 15 11 AST 49* 40* ALKPHOS 121 112 BILITOT 1.3* 1.1 BILIDIR 0.6*  --  CoagsNo results for input(s): INR, PTT in the last 168 hours. [facility] 05/02/231236 05/03/230420 05/03/230946 05/03/231528 05/03/231638 05/03/231720 NA 134* 130*  --   --  130*  --  K 4.3 - 4.4 4.0  --   --  4.8  --  CL 94* 92*  --   --  95*  --  CO2 27 26  --   --  24  --  BUN 53* 59*  --   --  59*  --  CREATININE 1.7* - 1.82* 1.80*  --   --  2.20*  --  GLU 93 - 89 87   < > 105* 112* 80 CALCIUM 10.4* 9.6  --   --  9.2  --  MG 2.7* 2.5*  --   --   --   --   < > = values in this interval not displayed. Cardiac BiomarkersTroponin: Recent Labs   05/02/231504 05/02/231711 05/03/231638 TROPTHS 101* 116* 154*  LipidsNo results found for: CHOL, HDL, LDL, TRIGA1CLab Results Component Value Date  HGBA1C 6.7 (H) 02/22/2022  Other Imaging XR Chest PA or AP (Portable)Result Date: 02/23/2022 No pulmonary edema, pneumothorax or interval airspace opacity. Reported And Signed  By: Maudie Flakes, MD  [facility] Radiology and Biomedical Imaging Consult Impression & Recommendations Overall, this is most likely medication induced which is compounded by his baseline amyloidosis and orthostatic hypotension, and not cardiogenic shock.  He is now back at his baseline.He does not appear floridly volume overloaded at this time, but would still not give additional fluids unless he were to become symptomatic with hypotension again. -continue 5 mg midodrine t.i.d. as needed while the losartan wears off-continue rosuvastatin, xarelto, tafamadis-hold home spironolactone, Jardiance iso hypotensionThe patient will be discussed with cardiology consult attending.PRELIMINARY NOTE - FINAL RECOMMENDATIONS PENDING ATTENDING ADDENDUM.Kathi Der, MD Cardiology Fellow

## 2022-02-24 NOTE — Progress Notes
Cardiovascular Medicine Consultation Follow Up NoteName: Frank Bradley Primary Team Attending: Leeroy Bock, MD  DOB: [birth year] Outpatient Cardiologist: n/a MRN: [id number] PCP: Pcp, Does Not Have A  Admit Date: 02/22/2022  Interval Events  Remained hypotensive after 5:00 p.m. midodrine with maps in the 50s, was started on Levophed at 7:30 p.m, currently on 0.06.  Continues to feel well this AM, endorses good PO intake. Denies lightheadedness/nausea/SOB, denies chest pain or palpitations. States he was taking midodrine TID at home.Sinus w PVCs on telemetry, intermittent afib Cardiac Studies None newMedications Inpatient Medications:Current Facility-Administered Medications Medication Dose Route Frequency Provider Last Rate Last Admin ? allopurinol (ZYLOPRIM) 150 mg, allopurinoL (ZYLOPRIM) 300 mg  450 mg Oral Daily Chinnugounder, Sankar, MD     ? insulin lispro (Admelog, HumaLOG) Sliding Scale (See admin instructions for dose) 1-18 Units  1-18 Units Subcutaneous TID AC Derry Skill, MD     ? insulin lispro (Admelog, HumaLOG) Sliding Scale (See admin instructions for dose) 1-18 Units  1-18 Units Subcutaneous Nightly Derry Skill, MD     ? midodrine (PROAMATINE) tablet 5 mg  5 mg Oral BID WC Chinnugounder, Sankar, MD   5 mg at 02/23/22 1651 ? polyethylene glycol (MIRALAX) packet 17 g  17 g Oral Daily Chinnugounder, Sankar, MD   17 g at 02/23/22 1246 ? rivaroxaban (XARELTO) tablet Tab 15 mg  15 mg Oral Daily (1700) Chinnugounder, Sankar, MD   15 mg at 02/23/22 1731 ? rosuvastatin (CRESTOR) tablet 5 mg  5 mg Oral Nightly Chinnugounder, Sankar, MD   5 mg at 02/23/22 2116 ? senna (SENOKOT) tablet 8.6 mg  1 tablet Oral Nightly Chinnugounder, Sankar, MD   8.6 mg at 02/23/22 2116 ? sodium chloride 0.9 % (new bag) bolus 500 mL  500 mL Intravenous Once Chinnugounder, Sankar, MD     ? sodium chloride 0.9 % flush 3 mL  3 mL IV Push Q8H Derry Skill, MD   3 mL at 02/23/22 2116 ? [Held by provider] spironolactone (ALDACTONE) tablet 12.5 mg  12.5 mg Oral Daily Chinnugounder, Sankar, MD     ? tafamidis (VYNDAMAX) capsule 61 mg  61 mg Oral Daily @0800  Chinnugounder, Sankar, MD   61 mg at 02/23/22 1247  Infusions: ? norepinephrine 0.06 mcg/kg/min (02/24/22 0742)  PRN Meds: dextrose (GLUCOSE) oral liquid 15 g **OR** fruit juice **OR** skim milk, dextrose (GLUCOSE) oral liquid 30 g **OR** fruit juice, dextrose injection, dextrose injection, glucagon, sodium chloride I&O, Vitals, Physical Exam Vitals:Vitals:  02/24/22 0400 02/24/22 0500 02/24/22 0600 02/24/22 0700 BP: 93/64 97/67 99/60  129/86 Pulse: 66 69 64 68 Resp: 15 (!) 12 (!) 13 17 Temp:     TempSrc:     SpO2: 100% 99% 99% 100% Weight:     Height:      I/O's last 24 hours:Gross Totals (Last 24 hours) at 02/24/2022 0747Last data filed at 02/24/2022 0600Intake 426.34 ml Output 1160 ml Net -733.66 ml  Weights:Admission Weight: 77.3 kg (170 lb 5.6 oz)Last 3 Weights  02/22/22 1800 02/23/22 0603 02/23/22 1911 Weight (lbs): 170.35 Lbs. 182.8 Lbs. 163.36 Lbs.  Physical exam:Constitutional: NADCV: irregular, S1, S2, 2/6 systolic murmur, elevated JVP, no pitting edema, WWPPulm: CTAB, no wheezes, rhonchi or cracklesAbd: soft, NT, ND, active BS, no rebound or guardingNeuro: A&Ox3, no focal deficitsPsych: euthymic mood and affect Other Labs [facility] 05/02/231236 05/03/231639 WBC 5.9 5.4 HGB 14.4 12.9* HCT 52.0 - 43.30 39.70 PLT 224 208  LFTsRecent Labs Lab 05/02/231236 05/03/230420 ALT 15 11 AST 49* 40* ALKPHOS 121 112  BILITOT 1.3* 1.1 BILIDIR 0.6*  --  CoagsNo results for input(s): INR, PTT in the last 168 hours. [facility] 05/02/231236 05/03/230420 05/03/230946 05/03/231638 05/03/231720 05/03/232108 05/04/230746 NA 134* 130* --  130*  --   --   --  K 4.3 - 4.4 4.0  --  4.8  --   --   --  CL 94* 92*  --  95*  --   --   --  CO2 27 26  --  24  --   --   --  BUN 53* 59*  --  59*  --   --   --  CREATININE 1.7* - 1.82* 1.80*  --  2.20*  --   --   --  GLU 93 - 89 87   < > 112* 80 144* 137* CALCIUM 10.4* 9.6  --  9.2  --   --   --  MG 2.7* 2.5*  --   --   --   --   --   < > = values in this interval not displayed. Cardiac BiomarkersNo results for input(s): TROPONINT, CKTOTAL, CKMB in the last 168 hours.Invalid input(s): TROPONINPOCLab Results Component Value Date  TROPONINI 3.880 (H) 10/09/2016 PBNPNo components found for: BTYPENATRIUR LipidsNo results found for: CHOL, HDL, LDL, TRIGA1CLab Results Component Value Date  HGBA1C 6.7 (H) 02/22/2022  Other Imaging XR Chest PA or AP (Portable)Result Date: 02/23/2022 No pulmonary edema, pneumothorax or interval airspace opacity. 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  His hospital course has been complicated by hypotension secondary to medication administration which is worsened by vasoplegia in the setting of amyloidosis and baseline orthostatic hypotension.He is currently asymptomatic from his hypotension.-continue standing midodrine 5 mg TID (currently on BID)-c/w rosuvastatin, xarelto-f/u labs pending for todayThe patient will be discussed with cardiology consult attending.PRELIMINARY NOTE - FINAL RECOMMENDATIONS PENDING ATTENDING ADDENDUM.Kathi Der, MD Cardiology Fellow

## 2022-02-24 NOTE — Progress Notes
[place]24 Hour Events: 66 year old male with a past medical history of CAD s/p CABG, bicuspid aortic valve s/p AVR (2011), HFrEF, TTR cardiac amyloidosis, A. Fib s/p ablation on xarelto, OSA on CPAP, DMII, CKD III who presented with shortness of breath and was initially managed with Bumex 4mg , dapagliflozin 5, and aldactone 25 (1L UOP) for acute heart failure exacerbation. In addition, he received Losartan 100 mg, a medication he was no longer using at home. Following administration of these meds, he developed hypotension to the 60s/40s, which was initially managed with 1 L IVF bolus and midodrine x2. He continued to have hypotension and he was transferred to the ICU for further managementICU courseUpon admission to the MICU, vitals were significant for T97.8, HR 60s, BP 66/43 (MAP 52), RR 19 spo2 95-100% RA. Levophed started. No LH, dizziness, new FND, dyspnea, chest discomfort, abdominal or LE swelling. No sick symptoms.Last 24 hours:-Started on levophed-EKG unchanged w/ afib, RBBB and PVCs, HR 70s. -TTE showed non collapsible IVC, EF 38%, enlarged LV and RV, RVSP 44, severe TR-CXR showing no pulmonary edema, pneumothorax or interval airspace opacity-Cardiology saw patient, recommended holding BP lowering agents until BP improves, continuing tafamidis, continue renal dose xarelto, stop and not restarting any and all beta blockers. Objective: Vitals: Temp:  [97.4 ?F (36.3 ?C)-97.9 ?F (36.6 ?C)] 97.9 ?F (36.6 ?C)Pulse:  [56-74] 73Resp:  [12-21] 20BP: (58-129)/(30-86) 93/74SpO2:  [95 %-100 %] 100 %Device (Oxygen Therapy): room air  I/O's: Intake/Output Summary (Last 24 hours) at 02/24/2022 1233Last data filed at 02/24/2022 1200Gross per 24 hour Intake 532.19 ml Output 1460 ml Net -927.81 ml  Physical Exam:Constitutional: A&Ox3. NAD.Cardiovascular: RRR, No MRG, no LE edema, no JVDPulmonary/Chest: Effort normal. LCTAB. No wheezes, rhonchi. Mild L>R rales in lower lung fieldsAbdominal: Soft. Bowel sounds are normal. No distension. No TTPNeurological: CNII-XII intact. Responding appropriately to questions, moving all extremitiesSkin: Skin is warm and dryLabs/Studies: Recent Labs   05/02/231236 05/03/230420 05/03/230946 05/03/231638 05/03/231720 05/04/231017 05/04/231218 NA 134* 130*  --  130*  --  129*  --  K 4.3 - 4.4 4.0  --  4.8  --  4.3  --  CL 94* 92*  --  95*  --  93*  --  CO2 27 26  --  24  --  22  --  BUN 53* 59*  --  59*  --  61*  --  CREATININE 1.7* - 1.82* 1.80*  --  2.20*  --  1.80*  --  GLU 93 - 89 87   < > 112*   < > 169* 138* ANIONGAP 13 12  --  11  --  14  --   < > = values in this interval not displayed. Recent Labs   05/02/231236 05/03/230420 05/03/231638 05/04/231017 CALCIUM 10.4* 9.6 9.2 9.9 MG 2.7* 2.5*  --  2.7* PHOS  --   --   --  4.0 Recent Labs   05/02/231236 05/03/230420 05/04/231017 AST 49* 40* 42* ALT 15 11 9  ALKPHOS 121 112 124* BILITOT 1.3* 1.1 1.4* BILIDIR 0.6*  --   --  ALBUMIN 4.3 3.9 3.9  Recent Labs   05/02/231236 05/03/231639 05/04/231017 WBC 5.9 5.4 5.6 HGB 14.4 12.9* 15.2 HCT 52.0 - 43.30 39.70 45.80 PLT 224 208 221 MCV 89.5 88.0 87.7 NEUTROPHILS 59.8  --  62.2 MONOCYTES 8.5  --  7.0 No results for input(s): INR, PTT in the last 72 hours.Invalid input(s): PTNo results for input(s): TROPONINT, CKTOTAL, CKMB in the last 72 hours.No  results for input(s): PHART, PCO2ART, PO2ART, O2SATART, BEART, HCO3ART in the last 72 hours. Microbiology:UA with 4+ leuks, 35 WBCs, culture in processBlood cx x2 prelim negative.Procal negativeDiagnostics:CXRstable blunting of the right lateral costophrenic sulcusAssessment / Plan: Neurologic. #sedation-not requiring sedation, mental status intact?Cardiovascular. 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 Patient currently with MAPs >65 and is requiring pressors.- wean levophed as tolerated for MAP >65. If systolic <90 and MAP 65+, titrate to systolic >90. Nursing aware of this titration plan.- continue midrodrine 5mg  TID per cardiology recs- avoid IVF, holding anti-HTN as below- continue home tafamidis 61mg  daily- cardiology following, appreciate recs?Rhythm: AFib s/p ablation (xarelto)- continue xarelto renally dosed 15 mg- continuous telemetry- ensure K> 4.0, Mg>2?Vessel: CAD s/p CABG- Continue home statin?#Hypertension: Bps improved on pressors- pressors as above- Hold home HTNsive medication?Pulmonary. Admitted for ADHF now euvolemic and without clinical evidence of significant pulmonary congestion- CTM- strict I+Os as below - no diuresis- FYI, tadalafil for ED, not pulm HTN?Infectious Disease#r/o infecious processUndifferentiated shock, likely medication-induced as above, no focal findings to suggest infectious etiology however will pursue infectious workup while monitoring response to pressors/holding antiHTN medications- hold off on abx pending workup- F/u Bcx, Ucx?Gastrointestinal.#nutrition- cardiac diet?#Bowel regimen- miralax/senna?#GI prophylaxis- stress ulcer ppx not indicated?Renal.#AKI on CKD likely pre-renal iso hypovolemia vs cardiorenal iso ADHF, CTM iso improved MAP- Daily BMP- Strict I/Os- daily wts- no diuresis todayElectrolytes- Replete K > 4.0, Phos > 3.0, Mg > 2.0?Endocrine.#glycemic control #DMIIHome regimen is jardiance, metformin. Last HbA1c was 6.7.- Hold home regimen while inpatient- low-dose ISS w/ POCT glucose TIDAC?#gout- cont allopurinol?Hematology / Oncology.#DVT prophylaxis- Continue xarelto?GOC- Code status full- Daily family updatesMICU ChecklistLines/Tubes/Drains: PIVCode Status:  Full codeVTE ppx: xareltoGI ppx: noneDispo: MICU while on pressors Primary Emergency Contact: Tieu,BRIDGETTE, Home Phone: [phone] Signed by Donia Pounds, MD, Feb 24, 2022

## 2022-02-24 NOTE — Plan of Care
Problem: Adult Inpatient Plan of CareGoal: Plan of Care Review5/12/2021 2245 by Halim Surrette, [name], RNOutcome: Interventions implemented as appropriate5/12/2021 2241 by Cherrie Gauze, [name], RNOutcome: Interventions implemented as appropriate Plan of Care Overview/ Patient Status    Assumed care of pt from 1920 to 0730.[name] is a 66 y.o. male. See CPOT f/s for pain doc. Pt with no pain at this time. Neuro: a/o x4, able to make needs known appropriately. MAE, OOB SBA x1. Steady on feet, reports no dizziness. Able to follow commands. CV: Cardiac monitoring continues. SB to SR with HR high 50s - 70s, with frequent PVCs. Peripheral levo gtt initiated at start of shift. Titrated to maintain MAPs > 65. Pulses palpable, extremities cool to touch. No c/o chest pain. Resp: Pt on room air, placed on nocturnal CPAP overnight. Lung sounds clear, no cough or SOB at this time. Able to maintain O2 sats > 92% for majority of night.GI: Cardiac consistent carb diet. Bowel sounds audible, no BM overnight. Abd soft/nontender. Bowel regimen given per MAR.GU/Endo: Voiding spontaneously with urinal at bedside. Blood sugar checks continues ACHS, see MAR for insulin ss coverage.Skin: See skin flowsheet. T+P independently. Skin check performed. Does have foam dressing to coccyx in place. Pt remains on pressure reduction mattress and heels elevated off bed at all times. Access: 2 PIVOther: pt transferred to [facility] at end of shift by other RNs, VSS, pt belongings brought to new room. Pt compliant and cooperative with care.[name] Lassie Demorest, RN5/3/2023See flowsheets, patient education and plan of care for additional information.

## 2022-02-25 ENCOUNTER — Inpatient Hospital Stay: Admit: 2022-02-25 | Payer: PRIVATE HEALTH INSURANCE | Attending: Diagnostic Radiology

## 2022-02-25 DIAGNOSIS — R0602 Shortness of breath: Secondary | ICD-10-CM

## 2022-02-25 LAB — COMPREHENSIVE METABOLIC PANEL
BKR A/G RATIO: 0.9 — ABNORMAL LOW (ref 1.0–2.2)
BKR ALANINE AMINOTRANSFERASE (ALT): 13 U/L (ref 9–59)
BKR ALBUMIN: 3.6 g/dL (ref 3.6–4.9)
BKR ALKALINE PHOSPHATASE: 114 U/L (ref 9–122)
BKR ANION GAP: 12 (ref 7–17)
BKR ASPARTATE AMINOTRANSFERASE (AST): 43 U/L — ABNORMAL HIGH (ref 10–35)
BKR AST/ALT RATIO: 3.3
BKR BILIRUBIN TOTAL: 1 mg/dL (ref <=1.2)
BKR BLOOD UREA NITROGEN: 57 mg/dL — ABNORMAL HIGH (ref 8–23)
BKR BUN / CREAT RATIO: 40.7 — ABNORMAL HIGH (ref 8.0–23.0)
BKR CALCIUM: 9.3 mg/dL (ref 8.8–10.2)
BKR CHLORIDE: 98 mmol/L (ref 98–107)
BKR CO2: 22 mmol/L (ref 20–30)
BKR CREATININE: 1.4 mg/dL — ABNORMAL HIGH (ref 0.40–1.30)
BKR EGFR, CREATININE (CKD-EPI 2021): 55 mL/min/{1.73_m2} — ABNORMAL LOW (ref >=60)
BKR GLOBULIN: 3.8 g/dL — ABNORMAL HIGH (ref 2.3–3.5)
BKR GLUCOSE: 110 mg/dL — ABNORMAL HIGH (ref 70–100)
BKR POTASSIUM: 5.1 mmol/L (ref 3.3–5.3)
BKR PROTEIN TOTAL: 7.4 g/dL (ref 6.6–8.7)
BKR SODIUM: 132 mmol/L — ABNORMAL LOW (ref 136–144)

## 2022-02-25 LAB — CBC WITH AUTO DIFFERENTIAL
BKR WAM ABSOLUTE IMMATURE GRANULOCYTES.: 0.02 x 1000/ÂµL (ref 0.00–0.30)
BKR WAM ABSOLUTE LYMPHOCYTE COUNT.: 1.59 x 1000/ÂµL (ref 0.60–3.70)
BKR WAM ABSOLUTE NRBC (2 DEC): 0 x 1000/ÂµL (ref 0.00–1.00)
BKR WAM ANALYZER ANC: 3.97 x 1000/ÂµL (ref 2.00–7.60)
BKR WAM BASOPHIL ABSOLUTE COUNT.: 0.04 x 1000/ÂµL (ref 0.00–1.00)
BKR WAM BASOPHILS: 0.6 % (ref 0.0–1.4)
BKR WAM EOSINOPHIL ABSOLUTE COUNT.: 0.05 x 1000/ÂµL (ref 0.00–1.00)
BKR WAM EOSINOPHILS: 0.8 % (ref 0.0–5.0)
BKR WAM HEMATOCRIT (2 DEC): 40.6 % (ref 38.50–50.00)
BKR WAM HEMOGLOBIN: 13.5 g/dL (ref 13.2–17.1)
BKR WAM IMMATURE GRANULOCYTES: 0.3 % (ref 0.0–1.0)
BKR WAM LYMPHOCYTES: 25.5 % (ref 17.0–50.0)
BKR WAM MCH (PG): 28.8 pg (ref 27.0–33.0)
BKR WAM MCHC: 33.3 g/dL (ref 31.0–36.0)
BKR WAM MCV: 86.6 fL (ref 80.0–100.0)
BKR WAM MONOCYTE ABSOLUTE COUNT.: 0.57 x 1000/ÂµL (ref 0.00–1.00)
BKR WAM MONOCYTES: 9.1 % (ref 4.0–12.0)
BKR WAM MPV: 9.6 fL (ref 8.0–12.0)
BKR WAM NEUTROPHILS: 63.7 % (ref 39.0–72.0)
BKR WAM NUCLEATED RED BLOOD CELLS: 0 % (ref 0.0–1.0)
BKR WAM PLATELETS: 227 x1000/ÂµL (ref 150–420)
BKR WAM RDW-CV: 17.7 % — ABNORMAL HIGH (ref 11.0–15.0)
BKR WAM RED BLOOD CELL COUNT.: 4.69 M/ÂµL (ref 4.00–6.00)
BKR WAM WHITE BLOOD CELL COUNT: 6.2 x1000/ÂµL (ref 4.0–11.0)

## 2022-02-25 LAB — UA REFLEX CULTURE

## 2022-02-25 LAB — URINE CULTURE: BKR URINE CULTURE, ROUTINE: >=100000 — AB

## 2022-02-25 LAB — URINALYSIS WITH CULTURE REFLEX      (BH LMW YH)
BKR BILIRUBIN, UA: NEGATIVE
BKR KETONES, UA: NEGATIVE
BKR LEUKOCYTE ESTERASE, UA: NEGATIVE
BKR NITRITE, UA: NEGATIVE
BKR PH, UA: 6.5 (ref 5.5–7.5)
BKR SPECIFIC GRAVITY, UA: 1.014 (ref 1.005–1.030)
BKR UROBILINOGEN, UA (MG/DL): 3 mg/dL — ABNORMAL HIGH (ref <=2.0)

## 2022-02-25 LAB — SODIUM, URINE, RANDOM W/O CREATININE: BKR SODIUM, URINE RANDOM: 31 mmol/L

## 2022-02-25 LAB — OSMOLALITY, URINE, RANDOM: BKR OSMOLALITY, URINE, RANDOM: 410 mosm/kg (ref 300–900)

## 2022-02-25 LAB — NT-PROBNPE: BKR B-TYPE NATRIURETIC PEPTIDE, PRO (PROBNP): 1297 pg/mL — ABNORMAL HIGH (ref <125.0)

## 2022-02-25 LAB — PHOSPHORUS     (BH GH L LMW YH): BKR PHOSPHORUS: 3.7 mg/dL (ref 2.2–4.5)

## 2022-02-25 LAB — URINE MICROSCOPIC     (BH GH LMW YH)
BKR RBC/HPF INSTRUMENT: 22 /HPF — ABNORMAL HIGH (ref 0–2)
BKR WBC/HPF INSTRUMENT: 10 /HPF — ABNORMAL HIGH (ref 0–5)

## 2022-02-25 LAB — MAGNESIUM: BKR MAGNESIUM: 2.4 mg/dL (ref 1.7–2.4)

## 2022-02-25 MED ORDER — RIVAROXABAN 20 MG TABLET
20 mg | Freq: Every day | ORAL | Status: DC
Start: 2022-02-25 — End: 2022-02-27
  Administered 2022-02-25 – 2022-02-26 (×2): 20 mg via ORAL

## 2022-02-25 MED ORDER — POLYETHYLENE GLYCOL 3350 17 GRAM ORAL POWDER PACKET
17 gram | Freq: Two times a day (BID) | ORAL | Status: DC
Start: 2022-02-25 — End: 2022-02-27
  Administered 2022-02-26 (×2): 17 gram via ORAL

## 2022-02-25 MED ORDER — CEPHALEXIN 500 MG CAPSULE
500 mg | Freq: Four times a day (QID) | ORAL | Status: DC
Start: 2022-02-25 — End: 2022-02-27
  Administered 2022-02-25 – 2022-02-26 (×5): 500 mg via ORAL

## 2022-02-25 MED ORDER — BISACODYL 10 MG RECTAL SUPPOSITORY
10 mg | Freq: Every day | RECTAL | Status: DC
Start: 2022-02-25 — End: 2022-02-27

## 2022-02-25 MED ORDER — CEPHALEXIN 500 MG CAPSULE
500 mg | Freq: Three times a day (TID) | ORAL | Status: DC
Start: 2022-02-25 — End: 2022-02-25
  Administered 2022-02-25: 15:00:00 500 mg via ORAL

## 2022-02-25 MED ORDER — BISACODYL 10 MG RECTAL SUPPOSITORY
10 mg | Freq: Every day | RECTAL | Status: DC | PRN
Start: 2022-02-25 — End: 2022-02-25

## 2022-02-25 MED ORDER — CEPHALEXIN 500 MG CAPSULE
500 mg | Freq: Three times a day (TID) | ORAL | Status: DC
Start: 2022-02-25 — End: 2022-02-25

## 2022-02-25 MED ORDER — CEFTRIAXONE IV PUSH 1000 MG VIAL & NS (ADULTS)
INTRAVENOUS | Status: DC
Start: 2022-02-25 — End: 2022-02-25

## 2022-02-25 MED ORDER — MELATONIN 3 MG TABLET
3 mg | Freq: Every evening | ORAL | Status: DC | PRN
Start: 2022-02-25 — End: 2022-02-27
  Administered 2022-02-25: 09:00:00 3 mg via ORAL

## 2022-02-25 NOTE — Transfer Summaries
[facility] Events: Last 24 hours:-Seen by HF service, not a transplant candidate-midodrine to 10 TID per HF cards recs-500 cc bolus NS overnight-levophed off this AMObjective: Vitals: Temp:  [97 ?F (36.1 ?C)-98.5 ?F (36.9 ?C)] 98.2 ?F (36.8 ?C)Pulse:  [61-101] 72Resp:  [11-29] 17BP: (61-159)/(42-130) 114/84SpO2:  [95 %-100 %] 100 %Device (Oxygen Therapy): room air  I/O's: Intake/Output Summary (Last 24 hours) at 02/25/2022 1038Last data filed at 02/25/2022 1030Gross per 24 hour Intake 1418.88 ml Output 2575 ml Net -1156.12 ml  Physical Exam:Constitutional: A&Ox3. NAD.Cardiovascular: RRR, No MRG, no LE edema, no JVDPulmonary/Chest: Effort normal. LCTAB. No wheezes, rhonchi. Mild L>R rales in lower lung fieldsAbdominal: Soft. Bowel sounds are normal. No distension. No TTPNeurological: CNII-XII intact. Responding appropriately to questions, moving all extremitiesSkin: Skin is warm and dryLabs/Studies: Notable for sodium 132 (131, 129, 130, 134). Cr 1.4 (1.8, 2.2), Recent Labs   05/03/231638 05/03/231720 05/04/231017 05/04/231218 05/04/231503 05/04/231726 05/05/230437 05/05/230730 NA 130*  --  129*  --  131*  --  132*  --  K 4.8  --  4.3  --  4.1  --  5.1  --  CL 95*  --  93*  --  95*  --  98  --  CO2 24  --  22  --  24  --  22  --  BUN 59*  --  61*  --  59*  --  57*  --  CREATININE 2.20*  --  1.80*  --  1.80*  --  1.40*  --  GLU 112*   < > 169*   < > 142*   < > 110* 92 ANIONGAP 11  --  14  --  12  --  12  --   < > = values in this interval not displayed. Recent Labs   05/03/230420 05/03/231638 05/04/231017 05/04/231503 05/05/230437 CALCIUM  --  9.2 9.9 9.1 9.3 MG  --   --  2.7* 2.6* 2.4 PHOS   < >  --  4.0 3.9 3.7  < > = values in this interval not displayed. Recent Labs   05/02/231236 05/03/230420 05/04/231017 05/05/230437 AST 49* 40* 42* 43* ALT 15 11 9 13  ALKPHOS 121 112 124* 114 BILITOT 1.3* 1.1 1.4* 1.0 BILIDIR 0.6*  --   --   --  ALBUMIN 4.3 3.9 3.9 3.6  Recent Labs   05/02/231236 05/03/231639 05/04/231017 05/05/230436 WBC 5.9 5.4 5.6 6.2 HGB 14.4 12.9* 15.2 13.5 HCT 52.0 - 43.30 39.70 45.80 40.60 PLT 224 208 221 227 MCV 89.5 88.0 87.7 86.6 NEUTROPHILS 59.8  --  62.2 63.7 MONOCYTES 8.5  --  7.0 9.1 No results for input(s): INR, PTT in the last 72 hours.Invalid input(s): PTNo results for input(s): TROPONINT, CKTOTAL, CKMB in the last 72 hours.No results for input(s): PHART, PCO2ART, PO2ART, O2SATART, BEART, HCO3ART in the last 72 hours. Microbiology:UA with 4+ leuks, 35 WBCs, culture in processBlood cx x2 prelim negative.Procal negativeDiagnostics:No new dataAssessment / Plan: Neurologic. #sedation-not requiring sedation, mental status intact?Cardiovascular. 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 UTI but no lactate so less likely septic.- Off levo- Continue midrodrine 10mg  TID per cardiology recs- BNP downtrending, bedside POCUS with non-collapsible normal sized IVC- Holding anti-HTN as below- continue home tafamidis 61mg  daily- cardiology following, appreciate recs?Rhythm: AFib s/p ablation (xarelto)- continue xarelto renally dosed 15 mg daily- continuous telemetry- ensure K> 4.0, Mg>2?Vessel:  CAD s/p CABG- Continue home statin?#Hypertension: Bps improved, off pressors and on midodrine- Hold home  HTNsive medication? Pulmonary. Admitted for ADHF now euvolemic and without clinical evidence of significant pulmonary congestion- CTM- strict I+Os as below - no diuresis- FYI, tadalafil for ED, not pulm HTN?Infectious Disease#UTI, GBS+ on 5/3 Ucx- does report some urinary frequency, urgency, and dysuria. - Start keflex, monitor for allergic rxn given history of facial swelling to penicillin about 40 years ago, plan for 5 day course- F/u BcxGastrointestinal.#nutrition- cardiac diet?#Bowel regimen- miralax/senna?#GI prophylaxis- stress ulcer ppx not indicated?Renal.#AKI on CKD likely pre-renal iso hypovolemia vs cardiorenal iso ADHF, CTM iso improved MAP- Daily BMP- Strict I/Os- daily wts- no diuresis Electrolytes- Replete K > 4.0, Phos > 3.0, Mg > 2.0?Endocrine.#glycemic control#DMIIHome regimen is jardiance, metformin. Last HbA1c was 6.7.- Hold home regimen while inpatient- low-dose ISS w/ POCT glucose TIDAC?#gout- cont allopurinol?Hematology / Oncology.#DVT prophylaxis- Continue xarelto?GOC- Code status full- Daily family updatesMICU ChecklistLines/Tubes/Drains: PIVCode Status:  Full codeVTE ppx: xareltoGI ppx: noneDispo: MICU today, consider transfer to cardiac floor if remain normotensive thorugh morning.  Primary Emergency Contact: Gerads,BRIDGETTE, Home Phone: [phone] Signed by Donia Pounds, MD, Feb 25, 2022

## 2022-02-25 NOTE — Plan of Care
Problem: Adult Inpatient Plan of CareGoal: Plan of Care ReviewOutcome: Interventions implemented as appropriate Plan of Care Overview/ Patient Status    Assumed care of pt from 1900 to 0730.[name] is a 66 y.o. male. See CPOT f/s for pain doc. Pt with no pain at this time.  Neuro: a/o x4, able to make needs known appropriately. MAE, OOB SBA x1. Steady on feet, reports no dizziness. Able to follow commands. Melatonin given overnight for pt comfort, with some effect. CV: Cardiac monitoring continues. SR Ventricular trigmeny HR high 70s - 80s, with frequent PVCs. Peripheral levo gtt continues. Titrated to maintain MAPs > 65. Pulses palpable, extremities cool to touch. No c/o chest pain. 500cc NS bolus given overnight.Addendum: 2230, pt noted to be diaphoretic and endorsing stomach pain. MD Kiage called to bedside - levo gtt titrated off protocol with MD Kiage at bedside, see MAR for titrations. Midodrine dose increased per MD Kiage. Resp: Pt on room air, placed on nocturnal CPAP overnight. Lung sounds clear, no cough or SOB at this time. Able to maintain O2 sats > 92% for majority of night.  GI: Cardiac consistent carb diet. Bowel sounds audible, no BM overnight. Abd soft/nontender. Bowel regimen given per MAR. GU/Endo: Voiding spontaneously with urinal at bedside. Blood sugar checks continues ACHS, see MAR for insulin ss coverage. Pt on 1L fluid restriction. Skin: See skin flowsheet. T+P independently. Skin check performed. Does have foam dressing to coccyx in place. Pt remains on pressure reduction mattress and heels elevated off bed at all times.  Access: 2 PIV Other: pt on peripheral pressors, hourly site assessments done per protocol.[name] Porscha Axley, RN5/02/2022  See flowsheets, patient education and plan of care for additional information.

## 2022-02-25 NOTE — Plan of Care
Problem: Adult Inpatient Plan of CareGoal: Plan of Care ReviewOutcome: Interventions implemented as appropriate Plan of Care Overview/ Patient Status     [name] is a 66 y.o. male. Pt with no pain at this time. Neuro: AxO x4. Cooperative with care, makes needs known. MAE. Pupils PERRL. Afebrile.HEENT: Oral care provided q4h yes.CV: Cardiac monitoring continues. NSR to SB on monitor with HR 50's-70's, some trigeminy and PVC's. BP maintaining MAP >65 with midodrine scheduled per MAR. +pulses. Resp: CPAP at night per orders, room air while awake. No SOB/distress, maintaining O2 sat >97%. Lungs diminished.GI: Abd soft and nontender. Cardiac consistent carb diet. Fair appetite. No BM< bowel regimen provided.GU/Endo: Voids spontaneously in urinal. Urine culture & analysis sent per orders. BG Q6h.  Skin: See skin flowsheet. T+P q2h, wedge in use,  yes . Skin check performed. Does have foam dressing to coccyx in place. Pt remains on pressure reduction mattress and heels elevated off bed. Access: 2 PIV Vital SignsTemp: 97.9 ?F (36.6 ?C)Temp src: OralPulse: 66Heart Rate Source: MonitorResp: 15BP: 94/66NIBP MAP (Monitor/Manual Entry): 74NIBP MAP (mmHg) (calculated/READ ONLY): 74BP Location: Right armBP Method: AutomaticPatient Position: Lying Oxygen TherapySpO2: 97 %Device (Oxygen Therapy): room airOxygen Concentration (%): 21$Oxygen On/Off : Off (stops the charge episode)Check SpO2 on Rest and Exertion: yesSpO2 on Room Air at Rest: (S) 97SpO2 on Room Air with Exertion: (S) 95Oxygen Device : (S) room airSamantha Tari Lecount, RN5/5/202312:21 PMSee flowsheets, patient education and plan of care for additional information.

## 2022-02-25 NOTE — Progress Notes
Cardiovascular Medicine Consultation Follow Up NoteName: Frank Bradley Primary Team Attending: Leeroy Bock, MD  DOB: [birth year] Outpatient Cardiologist: n/a MRN: [id number] PCP: Pcp, Does Not Have A  Admit Date: 02/22/2022  Interval Events Midodrine increased to 10 and given 500 cc normal saline, weaned off of Levophed. I/O -709 in last 24 hrs Denies acute concerns, wants to go home. Denies further SOB but hasn't been OOB, denies chest pain, palpitations.Telemetry: sinus, decreased ventricular ectopy todayCardiac Studies None newMedications Inpatient Medications:Current Facility-Administered Medications Medication Dose Route Frequency Provider Last Rate Last Admin ? allopurinol (ZYLOPRIM) 150 mg, allopurinoL (ZYLOPRIM) 300 mg  450 mg Oral Daily Chinnugounder, Sankar, MD   450 mg at 02/24/22 1319 ? insulin lispro (Admelog, HumaLOG) Sliding Scale (See admin instructions for dose) 1-18 Units  1-18 Units Subcutaneous TID [place] Levo Derry Skill, MD   1 Units at 02/24/22 1850 ? insulin lispro (Admelog, HumaLOG) Sliding Scale (See admin instructions for dose) 1-18 Units  1-18 Units Subcutaneous Nightly Derry Skill, MD     ? midodrine (PROAMATINE) tablet 10 mg  10 mg Oral TID WC Danella Deis, MD   10 mg at 02/24/22 2328 ? polyethylene glycol (MIRALAX) packet 17 g  17 g Oral Daily Chinnugounder, Sankar, MD   17 g at 02/24/22 0855 ? rivaroxaban (XARELTO) tablet Tab 15 mg  15 mg Oral Daily (1700) Chinnugounder, Sankar, MD   15 mg at 02/24/22 1850 ? rosuvastatin (CRESTOR) tablet 5 mg  5 mg Oral Nightly Chinnugounder, Sankar, MD   5 mg at 02/24/22 2038 ? senna (SENOKOT) tablet 8.6 mg  1 tablet Oral Nightly Chinnugounder, Sankar, MD   8.6 mg at 02/24/22 2038 ? sodium chloride 0.9 % flush 3 mL  3 mL IV Push Q8H Derry Skill, MD   3 mL at 02/24/22 1325 ? [Held by provider] spironolactone (ALDACTONE) tablet 12.5 mg  12.5 mg Oral Daily Chinnugounder, Sankar, MD     ? tafamidis (VYNDAMAX) capsule 61 mg  61 mg Oral Daily @0800  Chinnugounder, Sankar, MD   61 mg at 02/24/22 0856  Infusions: ? norepinephrine Stopped (02/25/22 0521)  PRN Meds: dextrose (GLUCOSE) oral liquid 15 g **OR** fruit juice **OR** skim milk, dextrose (GLUCOSE) oral liquid 30 g **OR** fruit juice, dextrose injection, dextrose injection, glucagon, melatonin, potassium bicarbonate-citric acid, sodium chloride I&O, Vitals, Physical Exam Vitals:Vitals:  02/25/22 0500 02/25/22 0520 02/25/22 0607 02/25/22 0700 BP: 107/62 101/64 (!) 85/59 94/70 Pulse: 69 69 74 70 Resp: 18 (!) 13 18 (!) 24 Temp:     TempSrc:     SpO2: 98% 99% 99% 99% Weight:     Height:      I/O's last 24 hours:Gross Totals (Last 24 hours) at 02/25/2022 0800Last data filed at 02/25/2022 0600Intake 1754.09 ml Output 2425 ml Net -670.91 ml  Weights:Admission Weight: 77.3 kg (170 lb 5.6 oz)Last 3 Weights  02/22/22 1800 02/23/22 0603 02/23/22 1911 Weight (lbs): 170.35 Lbs. 182.8 Lbs. 163.36 Lbs.  Physical exam:Constitutional: NADCV: rrr 2/6 systolic murmurPulm: CTABAbd: ntndExt: no LE edemaNeuro: A&Ox3, no focal deficitsPsych: euthymic mood and affect Other Labs CBCRecent Labs Lab 05/03/231639 05/04/231017 05/05/230436 WBC 5.4 5.6 6.2 HGB 12.9* 15.2 13.5 HCT 39.70 45.80 40.60 PLT 208 221 227  LFTsRecent Labs Lab 05/02/231236 05/03/230420 05/04/231017 05/05/230437 ALT 15 11 9 13  AST 49* 40* 42* 43* ALKPHOS 121 112 124* 114 BILITOT 1.3* 1.1 1.4* 1.0 BILIDIR 0.6*  --   --   --  CoagsNo results for input(s): INR, PTT in the last 168 hours. BMPRecent Labs Lab 05/04/231017  05/04/231218 05/04/231503 05/04/231726 05/04/232112 05/05/230437 05/05/230730 NA 129*  --  131*  --   --  132*  --  K 4.3  --  4.1  --   --  5.1  --  CL 93*  --  95*  --   --  98  --  CO2 22  --  24  --   --  22  --  BUN 61*  --  59*  --   --  57*  --  CREATININE 1.80*  --  1.80*  --   --  1.40*  --  GLU 169*   < > 142*   < > 100 110* 92 CALCIUM 9.9  --  9.1  --   --  9.3  --  MG 2.7*  --  2.6*  --   --  2.4  --  PHOS 4.0  --  3.9  --   --  3.7  --   < > = values in this interval not displayed. 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 Unfortunately, unable to initiate additional GDMT iso hypotesion and at baseline has been needing midodrine and is not a candidate for advanced therapies. Reviewed his RHC from 02/2021 and at that time his CI had been 1.6. His hospital course has been complicated by hypotension secondary to medication and diuresis iso R sided heart disease. -continue midodrine-c/w rosuvastatin, xarelto-if remains stable off pressors can transfer to cardiology floors, can d/c tomorrow off of jardiance and spironolactone. Has outpt f/u scheduled on 5/16.The patient will be discussed with cardiology consult attending.PRELIMINARY NOTE - FINAL RECOMMENDATIONS PENDING ATTENDING ADDENDUM.Kathi Der, MD Cardiology Fellow

## 2022-02-25 NOTE — Other
Advanced HF Consult NoteGarrett W Bradley is 66 year old man with a history of CAD s/p CABG, AVR, hereditary TTR amyloid, EF 35-40%, CKD3/4 presents with SOB. Pt diagnosed in [place] ([facility]) with hereditary TTR amyloid at the end of 09/2021.He had developed progressive SOB and cardiomyopathy. Cardiac MRI was c/w amyloid and tech PYP was positive. Myeloma workup was negative. 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 There was discussion about starting vutrisiran for his neuropathy. He had required midodrine for his orthostatsis and low BP.He recently moved to  and was to establish care w Dr. Hyacinth Meeker but got more SOB and came to ED on 5/2. Was felt to be in CHF and got IV Lasix and then IV Bumex 4mg  yesterday as well Farxiga and Losartan. He became hypotensive and was moved to ICU where he is now on levophed.Vitals:  02/24/22 1300 BP:  Pulse:  Resp:  Temp: 98.5 ?F (36.9 ?C) NAD, aox3, mild temporal wasting, thin No JVDIrregularly irregualr no mrgCTABSoft NT ND +BSWarm no edema.I/O     05/03 070105/04 0700 05/04 070105/05 0700  P.O. 240   I.V. (mL/kg) 205 (2.8) 87.2 (1.2)  Total Intake(mL/kg) 445 (6) 87.2 (1.2)  Urine (mL/kg/hr) 1160 (0.7) 500 (0.9)  Total Output 1160 500  Net -715 -412.8       Current Facility-Administered Medications Medication ? allopurinol (ZYLOPRIM) 150 mg, allopurinoL (ZYLOPRIM) 300 mg ? dextrose (GLUCOSE) oral liquid 15 g  Or ? fruit juice 120 mL  Or ? skim milk 240 mL ? dextrose (GLUCOSE) oral liquid 30 g  Or ? fruit juice 240 mL ? dextrose 10% injection 125 mL ? dextrose 10% injection 250 mL ? glucagon 1 mg in water for injection, sterile 1 mL (1 mg/mL) ? insulin lispro (Admelog, HumaLOG) Sliding Scale (See admin instructions for dose) 1-18 Units ? insulin lispro (Admelog, HumaLOG) Sliding Scale (See admin instructions for dose) 1-18 Units ? midodrine (PROAMATINE) tablet 5 mg ? norepinephrine bitartrate-D5W (LEVOPHED) 4 mg/250 mL (16 mcg/mL) infusion Soln ? polyethylene glycol (MIRALAX) packet 17 g ? potassium bicarbonate-citric acid (EFFER-K, K-LYTE) effervescent tablet for oral solution 20-60 mEq ? rivaroxaban (XARELTO) tablet Tab 15 mg ? rosuvastatin (CRESTOR) tablet 5 mg ? senna (SENOKOT) tablet 8.6 mg ? sodium chloride 0.9 % (new bag) bolus 500 mL ? sodium chloride 0.9 % flush 3 mL ? sodium chloride 0.9 % flush 3 mL ? [Held by provider] spironolactone (ALDACTONE) tablet 12.5 mg ? tafamidis (VYNDAMAX) capsule 61 mg  Labs:Creatinine (mg/dL) Date Value 30/86/5784 1.80 (H) CO2 (mmol/L) Date Value 02/24/2022 22 Potassium (mmol/L) Date Value 02/24/2022 4.3 Albumin (g/dL) Date Value 69/62/9528 3.9 Hemoglobin (g/dL) Date Value 41/32/4401 15.2  NT-proBNP (pg/mL) Date Value 02/24/2022 1,486.0 (H) 02/22/2022 2,711.0 (H) 04/24/2021 1,689.0 (H)  Troponin: Recent Labs   05/03/231638 05/03/231813 05/03/231948 TROPTHS 154* 140* 140*  Studies:Echo 02/23/22~ * Limited Echocardiogram~ * Moderately decreased left ventricular systolic function. The left ventricular ejection fraction calculated by biplane Simpson's was 38%.  No thrombus visualized in the left ventricle. Moderately increased left ventricular cavity size.  Severe concentric left ventricular hypertrophy.  Flattened septum in systole consistent with right ventricular pressure overload.~ * Moderately increased right ventricular cavity size.  Mildly decreased right ventricular systolic function.  Estimated right ventricular systolic pressure is 44 mmHg compatible with pulmonary hypertension. The right ventricular systolic pressure may be underestimated.~ * A bioprosthetic aortic valve is present.  Spectral doppler was not performed across the aortic valve on this study.~ * Mild  mitral regurgitation.~ * Severe tricuspid regurgitation.~ * Hepatic vein flow shows systolic flow reversal.  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 Hypotension in setting of excessive diuresis and over vasodilation in a preload dependent amyloid patient (RV dysfunction, diastolic dysfunction).Consider giving additional volume back.Can consider increasing Midodrine to 7.5 or 10 TID.Wean levophed as toleratedCan consider additional therapy for his amyloid neuropathy as outpatientContinue TafamidisOverall with advanced cardiac amyloid. Unlikely a good candidate for heart transplant given age and need for dual organ (heart-kidney) as well as comorbidities (multiple redo sternotomies, significant neuropathy). Shelle Iron, MD Advanced Heart Failure and Transplant [phone] ([place] office)[phone] [facility] office)

## 2022-02-26 DIAGNOSIS — M109 Gout, unspecified: Secondary | ICD-10-CM

## 2022-02-26 DIAGNOSIS — Z880 Allergy status to penicillin: Secondary | ICD-10-CM

## 2022-02-26 DIAGNOSIS — Z7982 Long term (current) use of aspirin: Secondary | ICD-10-CM

## 2022-02-26 DIAGNOSIS — Z7901 Long term (current) use of anticoagulants: Secondary | ICD-10-CM

## 2022-02-26 DIAGNOSIS — E1140 Type 2 diabetes mellitus with diabetic neuropathy, unspecified: Secondary | ICD-10-CM

## 2022-02-26 DIAGNOSIS — I428 Other cardiomyopathies: Secondary | ICD-10-CM

## 2022-02-26 DIAGNOSIS — I130 Hypertensive heart and chronic kidney disease with heart failure and stage 1 through stage 4 chronic kidney disease, or unspecified chronic kidney disease: Principal | ICD-10-CM

## 2022-02-26 DIAGNOSIS — Z20822 Contact with and (suspected) exposure to covid-19: Secondary | ICD-10-CM

## 2022-02-26 DIAGNOSIS — K219 Gastro-esophageal reflux disease without esophagitis: Secondary | ICD-10-CM

## 2022-02-26 DIAGNOSIS — Z7984 Long term (current) use of oral hypoglycemic drugs: Secondary | ICD-10-CM

## 2022-02-26 DIAGNOSIS — Z79899 Other long term (current) drug therapy: Secondary | ICD-10-CM

## 2022-02-26 DIAGNOSIS — E871 Hypo-osmolality and hyponatremia: Secondary | ICD-10-CM

## 2022-02-26 DIAGNOSIS — I5023 Acute on chronic systolic (congestive) heart failure: Secondary | ICD-10-CM

## 2022-02-26 DIAGNOSIS — G4733 Obstructive sleep apnea (adult) (pediatric): Secondary | ICD-10-CM

## 2022-02-26 DIAGNOSIS — B951 Streptococcus, group B, as the cause of diseases classified elsewhere: Secondary | ICD-10-CM

## 2022-02-26 DIAGNOSIS — Z888 Allergy status to other drugs, medicaments and biological substances status: Secondary | ICD-10-CM

## 2022-02-26 DIAGNOSIS — E854 Organ-limited amyloidosis: Secondary | ICD-10-CM

## 2022-02-26 DIAGNOSIS — N183 Chronic kidney disease, stage 3 unspecified: Secondary | ICD-10-CM

## 2022-02-26 DIAGNOSIS — I43 Cardiomyopathy in diseases classified elsewhere: Secondary | ICD-10-CM

## 2022-02-26 DIAGNOSIS — I272 Pulmonary hypertension, unspecified: Secondary | ICD-10-CM

## 2022-02-26 DIAGNOSIS — Z951 Presence of aortocoronary bypass graft: Secondary | ICD-10-CM

## 2022-02-26 DIAGNOSIS — Z953 Presence of xenogenic heart valve: Secondary | ICD-10-CM

## 2022-02-26 DIAGNOSIS — E7800 Pure hypercholesterolemia, unspecified: Secondary | ICD-10-CM

## 2022-02-26 DIAGNOSIS — I2510 Atherosclerotic heart disease of native coronary artery without angina pectoris: Secondary | ICD-10-CM

## 2022-02-26 DIAGNOSIS — E1122 Type 2 diabetes mellitus with diabetic chronic kidney disease: Secondary | ICD-10-CM

## 2022-02-26 DIAGNOSIS — I952 Hypotension due to drugs: Secondary | ICD-10-CM

## 2022-02-26 DIAGNOSIS — Z87891 Personal history of nicotine dependence: Secondary | ICD-10-CM

## 2022-02-26 DIAGNOSIS — N390 Urinary tract infection, site not specified: Secondary | ICD-10-CM

## 2022-02-26 DIAGNOSIS — N170 Acute kidney failure with tubular necrosis: Secondary | ICD-10-CM

## 2022-02-26 DIAGNOSIS — I071 Rheumatic tricuspid insufficiency: Secondary | ICD-10-CM

## 2022-02-26 DIAGNOSIS — I4510 Unspecified right bundle-branch block: Secondary | ICD-10-CM

## 2022-02-26 DIAGNOSIS — I4891 Unspecified atrial fibrillation: Secondary | ICD-10-CM

## 2022-02-26 LAB — CBC WITH AUTO DIFFERENTIAL
BKR WAM ABSOLUTE IMMATURE GRANULOCYTES.: 0.01 x 1000/ÂµL (ref 0.00–0.30)
BKR WAM ABSOLUTE LYMPHOCYTE COUNT.: 2.14 x 1000/ÂµL (ref 0.60–3.70)
BKR WAM ABSOLUTE NRBC (2 DEC): 0 x 1000/ÂµL (ref 0.00–1.00)
BKR WAM ANALYZER ANC: 2.91 x 1000/ÂµL (ref 2.00–7.60)
BKR WAM BASOPHIL ABSOLUTE COUNT.: 0.07 x 1000/ÂµL (ref 0.00–1.00)
BKR WAM BASOPHILS: 1.2 % (ref 0.0–1.4)
BKR WAM EOSINOPHIL ABSOLUTE COUNT.: 0.06 x 1000/ÂµL (ref 0.00–1.00)
BKR WAM EOSINOPHILS: 1.1 % (ref 0.0–5.0)
BKR WAM HEMATOCRIT (2 DEC): 42 % (ref 38.50–50.00)
BKR WAM HEMOGLOBIN: 14 g/dL (ref 13.2–17.1)
BKR WAM IMMATURE GRANULOCYTES: 0.2 % (ref 0.0–1.0)
BKR WAM LYMPHOCYTES: 38 % (ref 17.0–50.0)
BKR WAM MCH (PG): 29.1 pg (ref 27.0–33.0)
BKR WAM MCHC: 33.3 g/dL (ref 31.0–36.0)
BKR WAM MCV: 87.3 fL (ref 80.0–100.0)
BKR WAM MONOCYTE ABSOLUTE COUNT.: 0.44 x 1000/ÂµL (ref 0.00–1.00)
BKR WAM MONOCYTES: 7.8 % (ref 4.0–12.0)
BKR WAM MPV: 9.8 fL (ref 8.0–12.0)
BKR WAM NEUTROPHILS: 51.7 % (ref 39.0–72.0)
BKR WAM NUCLEATED RED BLOOD CELLS: 0 % (ref 0.0–1.0)
BKR WAM PLATELETS: 246 x1000/ÂµL (ref 150–420)
BKR WAM RDW-CV: 17.9 % — ABNORMAL HIGH (ref 11.0–15.0)
BKR WAM RED BLOOD CELL COUNT.: 4.81 M/ÂµL (ref 4.00–6.00)
BKR WAM WHITE BLOOD CELL COUNT: 5.6 x1000/ÂµL (ref 4.0–11.0)

## 2022-02-26 LAB — COMPREHENSIVE METABOLIC PANEL
BKR A/G RATIO: 1 (ref 1.0–2.2)
BKR ALANINE AMINOTRANSFERASE (ALT): 11 U/L (ref 9–59)
BKR ALBUMIN: 3.7 g/dL (ref 3.6–4.9)
BKR ALKALINE PHOSPHATASE: 103 U/L (ref 9–122)
BKR ANION GAP: 12 (ref 7–17)
BKR ASPARTATE AMINOTRANSFERASE (AST): 40 U/L — ABNORMAL HIGH (ref 10–35)
BKR AST/ALT RATIO: 3.6
BKR BILIRUBIN TOTAL: 1 mg/dL (ref <=1.2)
BKR BLOOD UREA NITROGEN: 42 mg/dL — ABNORMAL HIGH (ref 8–23)
BKR BUN / CREAT RATIO: 32.3 — ABNORMAL HIGH (ref 8.0–23.0)
BKR CALCIUM: 9.7 mg/dL (ref 8.8–10.2)
BKR CHLORIDE: 97 mmol/L — ABNORMAL LOW (ref 98–107)
BKR CO2: 20 mmol/L (ref 20–30)
BKR CREATININE: 1.3 mg/dL (ref 0.40–1.30)
BKR EGFR, CREATININE (CKD-EPI 2021): 60 mL/min/{1.73_m2} (ref >=60)
BKR GLOBULIN: 3.8 g/dL — ABNORMAL HIGH (ref 2.3–3.5)
BKR GLUCOSE: 87 mg/dL (ref 70–100)
BKR POTASSIUM: 4.8 mmol/L (ref 3.3–5.3)
BKR PROTEIN TOTAL: 7.5 g/dL (ref 6.6–8.7)
BKR SODIUM: 129 mmol/L — ABNORMAL LOW (ref 136–144)

## 2022-02-26 LAB — PHOSPHORUS     (BH GH L LMW YH): BKR PHOSPHORUS: 3.6 mg/dL (ref 2.2–4.5)

## 2022-02-26 LAB — MAGNESIUM: BKR MAGNESIUM: 2.4 mg/dL (ref 1.7–2.4)

## 2022-02-26 MED ORDER — MIDODRINE 10 MG TABLET
10 mg | ORAL_TABLET | Freq: Three times a day (TID) | ORAL | 4 refills | Status: AC
Start: 2022-02-26 — End: ?

## 2022-02-26 MED ORDER — CEPHALEXIN 500 MG CAPSULE
500 mg | ORAL_CAPSULE | Freq: Four times a day (QID) | ORAL | 1 refills | Status: AC
Start: 2022-02-26 — End: 2022-02-26

## 2022-02-26 MED ORDER — CEPHALEXIN 500 MG CAPSULE
500 mg | ORAL_CAPSULE | Freq: Four times a day (QID) | ORAL | 1 refills | Status: AC
Start: 2022-02-26 — End: ?

## 2022-02-26 MED ORDER — TORSEMIDE 20 MG TABLET
20 mg | ORAL_TABLET | Freq: Every day | ORAL | 6 refills | Status: AC | PRN
Start: 2022-02-26 — End: 2022-02-26

## 2022-02-26 MED ORDER — TORSEMIDE 20 MG TABLET
20 mg | ORAL_TABLET | Freq: Every day | ORAL | 5 refills | Status: SS | PRN
Start: 2022-02-26 — End: 2022-07-06

## 2022-02-26 MED ORDER — MIDODRINE 10 MG TABLET
10 mg | ORAL_TABLET | Freq: Three times a day (TID) | ORAL | 1 refills | Status: AC
Start: 2022-02-26 — End: 2022-02-26

## 2022-02-26 NOTE — Care Coordination-Inpatient
Patient Medicare IMI identified my role as  TC from Care Management department .  Medicare Important Message explained to patient over the phone.  Patient stated understanding the notice and was in agreement with the discharge plan.  Patient advised of the right to call the [employer] ([employer]) at [phone] if not in agreement with the discharge from the hospital.  Copy to be mailed to patient after confirming address.Frank Bradley, BS, MSHSTransition CoordinatorMobile Heartbeat [phone]

## 2022-02-26 NOTE — Transfer Summaries
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 Complicated by hypotension on the floors 60/40s in the setting of taking antihypertensive meds (losartan 100mg , which he no longer takes). Received intermittent gentle IVF back. Cardiology was consulted who recommended his home midodrine. Received low dose peripheral pressors. Advanced Heart Failure was involved, felt unlikely to be a good candidate for heart transplant. He was started on Keflex for GBS on urine culture but felt this was not driving his hypotension. Off pressors as of 6am. On my eval, patient resting comfortably on CPAP. Last BP 112/71. He denies dizziness or any pain whatsoever. Thinks his breathing feels better than on admission.Meds: Current Facility-Administered Medications Medication Dose Route Frequency Provider Last Rate Last Admin ? allopurinol (ZYLOPRIM) 150 mg, allopurinoL (ZYLOPRIM) 300 mg  450 mg Oral Daily Chinnugounder, Sankar, MD   450 mg at 02/25/22 0813 ? bisacodyL (DULCOLAX) suppository 10 mg  10 mg Rectal Daily Hossin, Tamanna, MD     ? cephALEXin (KEFLEX) capsule 500 mg  500 mg Oral Q6H Hossin, Tamanna, MD   500 mg at 02/25/22 1733 ? insulin lispro (Admelog, HumaLOG) Sliding Scale (See admin instructions for dose) 1-18 Units  1-18 Units Subcutaneous TID [place] Levo Derry Skill, MD   1 Units at 02/24/22 1850 ? insulin lispro (Admelog, HumaLOG) Sliding Scale (See admin instructions for dose) 1-18 Units  1-18 Units Subcutaneous Nightly Derry Skill, MD ? midodrine (PROAMATINE) tablet 10 mg  10 mg Oral TID WC Danella Deis, MD   10 mg at 02/25/22 1733 ? polyethylene glycol (MIRALAX) packet 17 g  17 g Oral BID Hossin, Tamanna, MD   17 g at 02/25/22 2010 ? rivaroxaban (XARELTO) tablet Tab 20 mg  20 mg Oral Daily (1700) Hossin, Tamanna, MD   20 mg at 02/25/22 1733 ? rosuvastatin (CRESTOR) tablet 5 mg  5 mg Oral Nightly Chinnugounder, Sankar, MD   5 mg at 02/25/22 2010 ? senna (SENOKOT) tablet 8.6 mg  1 tablet Oral Nightly Chinnugounder, Sankar, MD   8.6 mg at 02/25/22 2011 ? sodium chloride 0.9 % flush 3 mL  3 mL IV Push Q8H Derry Skill, MD   3 mL at 02/24/22 1325 ? [Held by provider] spironolactone (ALDACTONE) tablet 12.5 mg  12.5 mg Oral Daily Chinnugounder, Sankar, MD     ? tafamidis (VYNDAMAX) capsule 61 mg  61 mg Oral Daily @0800  Chinnugounder, Sankar, MD   61 mg at 02/25/22 0814 Objective: Vitals:Temp:  [97 ?F (36.1 ?C)-98.2 ?F (36.8 ?C)] 97.4 ?F (36.3 ?C)Pulse:  [57-76] 72Resp:  [13-24] 18BP: (85-147)/(51-117) 112/71SpO2:  [97 %-100 %] 100 %Device (Oxygen Therapy): room air  I/O's:Intake/Output Summary (Last 24 hours) at 02/25/2022 2313Last data filed at 02/25/2022 2100Gross per 24 hour Intake 591.22 ml Output 2000 ml Net -1408.78 ml Wt Readings from Last 3 Encounters: 02/25/22 77.1 kg 04/25/21 93.2 kg 02/23/17 108.9 kg  Physical Exam:Gen: nontoxic appearing, resting comfortablyPulm: clear anteriorly. No tachypnea or accessory muscle useCV: RRR, + gallopAbd:  Soft nontenderNeuro: alert, normal speechMSK:  No pitting edema BLEData: Recent Pertinent Labs:Recent Labs Lab 05/03/231639 05/03/231720 05/04/231017 05/04/231218 05/05/230436 05/05/230437 05/05/230730 05/05/232204 WBC 5.4  --  5.6  --  6.2  --   --   --  HGB 12.9*  --  15.2  --  13.5  --   --   --  HCT 39.70  --  45.80  --  40.60  --   --   --  PLT 208  --  221  --  227  --   --   -- NA  --   --  129*   < >  --  132*  --   --  K  --   --  4.3   < >  --  5.1  --   --  CL  --   --  93*   < >  --  98  --   --  CO2  --   --  22   < >  --  22  --   --  BUN  --   --  61*   < >  --  57*  --   --  CREATININE  --   --  1.80*   < >  --  1.40*  --   --  GLU  --    < > 169*   < >  --  110*   < > 111* ANIONGAP  --   --  14   < >  --  12  --   --  CALCIUM  --   --  9.9   < >  --  9.3  --   --  MG  --   --  2.7*   < >  --  2.4  --   --  PHOS  --   --  4.0   < >  --  3.7  --   --   < > = values in this interval not displayed. Recent Labs Lab 05/02/231236 05/03/230420 05/04/231017 05/05/230437 ALT 15 11 9 13  AST 49* 40* 42* 43* ALKPHOS 121 112 124* 114 BILITOT 1.3* 1.1 1.4* 1.0 BILIDIR 0.6*  --   --   --  No results for input(s): PTT, LABPROT, INR in the last 168 hours.UA 22 RBC, 10 WBC, many bacteriaNT-proBnP 1297 (downtrending from 2700 on admission)Micro:Repeat urine culture today pending5/3 urine culture >100k GBS5/3 blood cultures x2 NGTDImaging:TTE 02/23/22~ * Limited Echocardiogram~ * Moderately decreased left ventricular systolic function. The left ventricular ejection fraction calculated by biplane Simpson's was 38%.  No thrombus visualized in the left ventricle. Moderately increased left ventricular cavity size.  Severe concentric left ventricular hypertrophy.  Flattened septum in systole consistent with right ventricular pressure overload.~ * Moderately increased right ventricular cavity size.  Mildly decreased right ventricular systolic function.  Estimated right ventricular systolic pressure is 44 mmHg compatible with pulmonary hypertension. The right ventricular systolic pressure may be underestimated.~ * A bioprosthetic aortic valve is present.  Spectral doppler was not performed across the aortic valve on this study.~ * Mild mitral regurgitation.~ * Severe tricuspid regurgitation.~ * Hepatic vein flow shows systolic flow reversal.  IVC diameter > 2.1 cm that collapses < 50% with a sniff suggests high RAP (10-20 mmHg, mean 15 mmHg).~ * No significant pericardial effusion.~ * No prior transthoracic study available for comparison.Assessment & Plan Very pleasant 65yo man with h/o NICM HFrEF 38% with cardiac amyloidosis, afib s/p ablation on xarelto, CKD initially admitted with mild heart failure exacerbation, in MICU from 5/3-5/5 for hypotension suspected to be due to overdiuresis and iatrogenic requiring low dose peripheral pressors, now off pressors and  BP stable, admitted back to Cardiology floor for further care.#Hypotension: appreciate Cardiology input, overdiuresis and medication-induced (ARB he no longer takes) in pre-load dependent pt with RV dysfunction. Doubt septic component, though being treated for UTI.- continue home midodrine 10mg  TID- holding all antihypertensives- likely not transplant candidate per Advanced Heart Failure team- continue home tafamidins 61mg  daily#UTI: GBS on admission urine culture. Pt asymptomatic- continue Keflex x5 days#AKI on CKD: baseline Cr ~1.1 - 1.4, was 1.8 on admission, now improving to 1.4- monitor#History of afib:- continue xarelto- teleHousekeeping:#Diet: cardiac/consistent carb#Access: PIV#PPx: xarelto#Dispo: cardiology#CODE STATUS: FULL CODE  Primary Emergency Contact: Heeter,BRIDGETTE, Home Phone: [phone] declined my offer to call family overnightSigned UJ:WJXBJYN L. Bjorn Loser, MDHospitalist AttendingPrefer to be contacted by MHBElectronically Signed by Amie Critchley, MD, Feb 25, 2022 11:13 PM

## 2022-02-26 NOTE — Treatment Summary
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 Overall concerning for mild decompensation in his heart failure. -admit to [facility] cardiology service-diurese to net out 1-2L daily; can start with 4mg  of iv bumex, if not approaching goal after first dose, double next dose-continue xareto-obtain updated TTE -please see forthcoming H and P from [facility] service for further details

## 2022-02-26 NOTE — Plan of Care
Plan of Care Overview/ Patient Status    CM Assessment and Discharge Planning  Flowsheet Row Most Recent Value Discharge Planning Coordination Recommendations  Discharge Planning Coordination Recommendations Home with MD follow-up/No needs identified Case Manager reviewed plan of care/ continuum of care need's with  Interdisciplinary Team Discharge Planning  Patient/Patient Representative goals/treatment preferences for discharge are:  Home Patient/Patient Representative was presented with a list of facilities, agencies and/or dme providers and Referral(s) placed for: None Mode of Transportation  Private car  (add comment for special considerations) Patient accompanied by family CM D/C Readiness  PASRR completed and approved N/A Authorization number obtained, if required N/A Is there a 3 day INPATIENT Qualifying stay for Medicare Patients? Yes Medicare IM- signed, dated, timed and scanned, if required Yes DME Authorized/Delivered N/A No needs identified/ follow up with PCP/MD Yes Post acute care services secured W10 complete N/A Pri Completed and Accepted  N/A Is the destination address correct on the W10 N/A Finalized Plan  Expected Discharge Date 02/26/22 Discharge Disposition Home or Self Care   Weekend on callDischarge order is in place Team did not contact our departmentSpoke to patient he is in agreement with POCFollow Up with MDElizabeth Kathie Rhodes) Freida Nebel RNMHB  [phone]

## 2022-02-26 NOTE — Plan of Care
Plan of Care Overview/ Patient Status    1900-2130: [name] is a 66 y.o. male. Pt with no pain at this time. Neuro: AA&O x 4. Calm, cooperative, and pleasant. Sitting up in chairs talking to family on the phone. Makes needs known and follows commands. OOB SBA. Cardiac monitoring continues. BP stable. Afebrile. No edema. On RA and satting WDL. Denies SOB. CC diet maintained. LBM 5/1/ senna and miralax given per eMAR. Voiding spontaneously in urinal. POCT ACHS. Blood glucose in 80s- soda given w nighttime meds. 2 pivs maintained. Transfer to V3E. Report given to RN Alexa.  Vital SignsTemp: 97.7 ?F (36.5 ?C)Temp src: AxillaryPulse: 68Heart Rate Source: MonitorResp: 18BP: (!) 147/117NIBP MAP (Monitor/Manual Entry): 128NIBP MAP (mmHg) (calculated/READ ONLY): 128BP Location: Right armBP Method: AutomaticPatient Position: Lying Oxygen TherapySpO2: 100 %Device (Oxygen Therapy): room airOxygen Concentration (%): 21$Oxygen On/Off : Off (stops the charge episode)Check SpO2 on Rest and Exertion: yesSpO2 on Room Air at Rest: (S) 97SpO2 on Room Air with Exertion: (S) 95Oxygen Device : (S) room airSunny A Anthonio Mizzell, RN5/5/20238:19 PMSee flowsheets, patient education and plan of care for additional information.

## 2022-02-26 NOTE — Plan of Care
Problem: Adult Inpatient Plan of CareGoal: Plan of Care ReviewOutcome: Interventions implemented as appropriateFlowsheets (Taken 02/26/2022 0223)Progress: improvingPlan of Care Reviewed With: patient Plan of Care Overview/ Patient Status    Transfer Note Nursing YUE GLASHEEN is a 66 y.o. male transferred from V3N/MICU. with a chief complaint of SOB.  Assisted in to bed.  Vitals:  02/25/22 2037 02/25/22 2100 02/25/22 2153 02/25/22 2201 BP: 98/77 125/79 112/71  Pulse:  66 72  Resp:  18 18  Temp:    97.4 ?F (36.3 ?C) TempSrc:    Axillary SpO2:  100% 100%  Weight:   77.1 kg  Height:   6' 2 (1.88 m)   I have reviewed the patient's current medication orders.     Current Facility-Administered Medications Medication Dose Route Frequency Provider Last Rate Last Admin ? allopurinol (ZYLOPRIM) 150 mg, allopurinoL (ZYLOPRIM) 300 mg  450 mg Oral Daily Chinnugounder, Sankar, MD   450 mg at 02/25/22 0813 ? bisacodyL (DULCOLAX) suppository 10 mg  10 mg Rectal Daily Hossin, Tamanna, MD     ? cephALEXin (KEFLEX) capsule 500 mg  500 mg Oral Q6H Hossin, Tamanna, MD   500 mg at 02/26/22 0014 ? insulin lispro (Admelog, HumaLOG) Sliding Scale (See admin instructions for dose) 1-18 Units  1-18 Units Subcutaneous TID [place] Levo Derry Skill, MD   1 Units at 02/24/22 1850 ? insulin lispro (Admelog, HumaLOG) Sliding Scale (See admin instructions for dose) 1-18 Units  1-18 Units Subcutaneous Nightly Derry Skill, MD     ? midodrine (PROAMATINE) tablet 10 mg  10 mg Oral TID WC Danella Deis, MD   10 mg at 02/25/22 1733 ? polyethylene glycol (MIRALAX) packet 17 g  17 g Oral BID Hossin, Tamanna, MD   17 g at 02/25/22 2010 ? rivaroxaban (XARELTO) tablet Tab 20 mg  20 mg Oral Daily (1700) Hossin, Tamanna, MD   20 mg at 02/25/22 1733 ? rosuvastatin (CRESTOR) tablet 5 mg  5 mg Oral Nightly Chinnugounder, Sankar, MD   5 mg at 02/25/22 2010 ? senna (SENOKOT) tablet 8.6 mg  1 tablet Oral Nightly Chinnugounder, Sankar, MD   8.6 mg at 02/25/22 2011 ? sodium chloride 0.9 % flush 3 mL  3 mL IV Push Q8H Derry Skill, MD   3 mL at 02/24/22 1325 ? [Held by provider] spironolactone (ALDACTONE) tablet 12.5 mg  12.5 mg Oral Daily Chinnugounder, Sankar, MD     ? tafamidis (VYNDAMAX) capsule 61 mg  61 mg Oral Daily @0800  Chinnugounder, Sankar, MD   61 mg at 02/25/22 0814  dextrose (GLUCOSE) oral liquid 15 g **OR** fruit juice **OR** skim milk, dextrose (GLUCOSE) oral liquid 30 g **OR** fruit juice, dextrose injection, dextrose injection, glucagon, melatonin, potassium bicarbonate-citric acid, sodium chlorideCurrent Facility-Administered Medications Medication Dose Route Frequency Last Rate ? allopurinoL  450 mg Oral Daily   ? bisacodyL  10 mg Rectal Daily   ? cephALEXin  500 mg Oral Q6H   ? dextrose (GLUCOSE) oral liquid 15 g  15 g Oral Q15 MIN PRN    Or ? fruit juice  120 mL Oral Q15 MIN PRN    Or ? skim milk  240 mL Oral Q15 MIN PRN   ? dextrose (GLUCOSE) oral liquid 30 g  30 g Oral Q15 MIN PRN    Or ? fruit juice  240 mL Oral Q15 MIN PRN   ? dextrose injection  12.5 g Intravenous Q15 MIN PRN   ? dextrose injection  25 g Intravenous Q15 MIN PRN   ?  glucagon  1 mg Intramuscular Once PRN   ? insulin lispro  1-18 Units Subcutaneous TID AC   ? insulin lispro  1-18 Units Subcutaneous Nightly   ? melatonin  3 mg Oral Nightly PRN   ? midodrine  10 mg Oral TID WC   ? polyethylene glycol  17 g Oral BID   ? potassium bicarbonate-citric acid  20-60 mEq Oral Q4H PRN   ? rivaroxaban  20 mg Oral Daily (1700)   ? rosuvastatin  5 mg Oral Nightly   ? senna  1 tablet Oral Nightly   ? sodium chloride  3 mL IV Push Q8H   ? sodium chloride  3 mL IV Push PRN for Line Care   ? [Held by provider] spironolactone  12.5 mg Oral Daily   ? tafamidis  61 mg Oral Daily @0800    .I have reviewed patient valuables Valuable(s) : Jewelry; Dentures; Vision; Print production planner; Other (Comments); Laptop Print production planner, lottery tickets, Manufacturing systems engineer, backpack) (02/25/2022  9:53 PM) Patient A+Ox4. Independent at baseline. On RA, LS clear throughout. CPAP in use overnight. Tele tracing NSR w/ frequent PVCs/?Accelerated junctional rhythm, HR 60s-70s. No c/o SOB, chest pain, or palpitations. VSS. No GI/GU complaints. Urinal at bedside for accurate I/Os. Awaiting repeat urine culture results, patient continues on Kelfex 500 mg q6h for +urine culture on admission. BS checks AC/HS, BG obtained on arrival to floor; 111. No pain reported. Skin intact. POC reviewed w/ patient who verbalized understanding. Patient's vyndamax from home, approved by pharmacy for use. Secured in patient specific bin of pyxis. Safety maintained. Bed locked in lowest position, upper side rails up x2, call bell w/in reach. Patient resting comfortably overnight. See flowsheets, patient education and plan of care for additional information. Electronically Signed by Wynn Banker, RN, Feb 26, 2022 4:07 AM

## 2022-02-26 NOTE — Plan of Care
Problem: Adult Inpatient Plan of CareGoal: Plan of Care ReviewOutcome: Interventions implemented as appropriateGoal: Patient-Specific Goal (Individualized)Outcome: Interventions implemented as appropriateGoal: Absence of Hospital-Acquired Illness or InjuryOutcome: Interventions implemented as appropriateGoal: Optimal Comfort and WellbeingOutcome: Interventions implemented as appropriateGoal: Readiness for Transition of CareOutcome: Interventions implemented as appropriate Problem: Fall Injury RiskGoal: Absence of Fall and Fall-Related InjuryOutcome: Interventions implemented as appropriate Problem: Obstructive Sleep Apnea Risk or ActualGoal: Unobstructed Breathing During SleepOutcome: Interventions implemented as appropriate Plan of Care Overview/ Patient Status

## 2022-02-26 NOTE — Discharge Summary
[facility]	Med/Surg Discharge SummaryPatient Data:  Patient Name: Frank Bradley Admit date: 02/22/2022 Age: 66 y.o. Discharge date: 02/26/2022 DOB: [birth year]	 Discharge Attending Physician: Dr Grayland Ormond MRN: [id number]	 PCP: Pcp, Does Not Have A Principal Diagnosis: Shortness of breathOther Diagnosis: shock  Hospital Course: ROMNEY COMPEAN a 66 y.o. 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 He was diuresed successfully. However, the combination of overdiuresis and vasodilation 2/2 addition of losartan 100 mg led to significant symptomatic hypotension down to 60/40 and MICU admission. He quickly improved with better volume balance and initiation of levophed and then midodrine 5 mg TID. He was also found to have a UTI with GBS for which he was treated with 5 days of Keflex. He has outpatient follow up with general cardiology and cardiomyopathy clinic. Given the hypotension episodes, he should remain on Midodrine 10 mg TID. If his BP is stable with that, OK to take 12.5 mg daily of Aldactone. Regarding his torsemide dosing, he should be holding it unless he gains 3 pounds from his baseline/dry weight. Then he should be taking 40 mg of Torsemide daily.  Cognitive Status at Discharge:Baseline ISSUES TO BE ADDRESSED POST DISCHARGE: ?	Continue follow up with general cardiology and Amyloid clinic ?	5 days in total of Keflex ?	He should remain on Midodrine 10 mg TID. If his BP is stable with that, OK to take 12.5 mg daily of Aldactone. Regarding his torsemide dosing, he should be holding it unless he gains 3 pounds from his baseline/dry weight. Then he should be taking 40 mg of Torsemide daily.  Discharged Condition: goodDisposition: Home Allergies Allergies Allergen Reactions ? Penicillin G Swelling ? Atorvastatin   Discharge vitals: Blood pressure 110/72, pulse 78, temperature 97.2 ?F (36.2 ?C), temperature source Oral, resp. rate 20, height 6' 2 (1.88 m), weight 77.8 kg, SpO2 100 %.Discharge Physical Exam and Pertinent Findings of Physical Exam  Unremarkable Physical Exam AAOx3 in NAD CTABL IR IR Abd soft NT ND LE without edema Pending Labs and Tests: Pending Lab Results   Order Current Status  Urine culture In process  Blood culture #1 Preliminary result  Blood culture #2 Preliminary result  Discharge Medications: Current Discharge Medication List  START taking these medications  Details cephALEXin (KEFLEX) 500 mg capsule Take 1 capsule (500 mg total) by mouth every 6 (six) hours for 3 days.Qty: 12 capsule, Refills: 0Start date: 02/26/2022, End date: 03/01/2022   CONTINUE these medications which have CHANGED  Details midodrine (PROAMATINE) 10 mg tablet Take 1 tablet (10 mg total) by mouth 3 (three) times daily with meals. Do not take any doseslater than evening meal or less than 4 hours before bedtime.Qty: 90 tablet, Refills: 0Start date: 02/26/2022, End date: 03/28/2022  torsemide (DEMADEX) 20 mg tablet Take 2 tablets (40 mg total) by mouth daily as needed. Take torsemide only if weight gain > 3 pounds from your baseline. If at your baseline weight, do not take the torsemide.Qty: 60 tablet, Refills: 5Start date: 02/26/2022   CONTINUE these medications which have NOT CHANGED  Details allopurinoL (ZYLOPRIM) 300 mg tablet Take 1 tablet (300 mg total) by mouth daily.  colchicine (COLCRYS) 0.6 mg tablet Take 1 tablet (0.6 mg total) by mouth 2 (two) times daily as needed.  JARDIANCE 10 mg tablet Take 1 tablet (10 mg total) by mouth daily before  breakfast.  magnesium oxide (MAG-OX) 400 mg (241.3 mg magnesium) tablet Take 1 tablet (400 mg total) by mouth daily.  metFORMIN (GLUCOPHAGE) 500 MG Immediate Release tablet Take 1 tablet (500 mg total) by mouth 2 (two) times daily with breakfast and dinner.  multivitamin capsule Take 1 capsule by mouth daily.  rivaroxaban (XARELTO) 20 mg Tab tablet 1 tablet (20 mg total) Daily @1700 .  rosuvastatin (CRESTOR) 5 MG tablet Take 1 tablet (5 mg total) by mouth nightly.  spironolactone (ALDACTONE) 25 mg tablet Take 0.5 tablets (12.5 mg total) by mouth daily.  tadalafiL, pulm. hypertension, (ADCIRCA) 20 mg Tab tablet TAKE 1 TABLET BY MOUTH DAILY AS NEEDED  tafamidis (VYNDAMAX) 61 mg capsule Take 1 capsule (61 mg total) by mouth daily.  DOCOSAHEXANOIC ACID/EPA (FISH OIL ORAL) Take 1 capsule by mouth daily.  lansoprazole (PREVACID) 15 mg capsule Take 1 capsule (15 mg total) by mouth daily as needed.  vutrisiran (AMVUTTRA) 25 mg/0.5 mL Syrg syringe Inject 25 mg under the skin every 3 (three) months.   STOP taking these medications   telmisartan (MICARDIS) 40 mg tablet    .Hemaglobin A1c   Hemaglobin A1C Latest Ref Rng & Units 02/22/2022  BKR HEMOGLOBIN A1C 4.0 - 5.6 % 6.7(H)  Pertinent lab findings and test results: Echo  * Limited Echocardiogram~ * Moderately decreased left ventricular systolic function. The left ventricular ejection fraction calculated by biplane Simpson's was 38%.  No thrombus visualized in the left ventricle. Moderately increased left ventricular cavity size.  Severe concentric left ventricular hypertrophy.  Flattened septum in systole consistent with right ventricular pressure overload.~ * Moderately increased right ventricular cavity size.  Mildly decreased right ventricular systolic function.  Estimated right ventricular systolic pressure is 44 mmHg compatible with pulmonary hypertension. The right ventricular systolic pressure may be underestimated.~ * A bioprosthetic aortic valve is present.  Spectral doppler was not performed across the aortic valve on this study.~ * Mild mitral regurgitation.~ * Severe tricuspid regurgitation.~ * Hepatic vein flow shows systolic flow reversal.  IVC diameter > 2.1 cm that collapses < 50% with a sniff suggests high RAP (10-20 mmHg, mean 15 mmHg).~ * No significant pericardial effusion.~ * No prior transthoracic study available for comparison.~ Renal function at baseline Na 129 UA + Uctx + for GBS Follow-up Information:[facility] at [address] [facility]  2[address] [facility] [phone], Does Not Have A PMH [facility] Past Medical History: Diagnosis Date ? Arrhythmia  ? Chronic coronary artery disease  ? Diabetes mellitus (HC Code)  ? Encounter for blood transfusion  ? GERD (gastroesophageal reflux disease)  ? Hypercholesteremia  ? Hypertension  ? Obstructive sleep apnea  ? Syncope, psychogenic   Past Surgical History: Procedure Laterality Date ? CERVICAL DISC ARTHROPLASTY   ? TOOTH EXTRACTION   ? VALVE REPLACEMENT    Social History Family History Social History Tobacco Use ? Smoking status: Former   Types: Cigarettes ? Smokeless tobacco: Never ? Tobacco comments:   Quit early 1980's Substance Use Topics ? Alcohol use: Yes  Family History Problem Relation Age of Onset ? Heart disease Mother  ? Diabetes Mother  ? Stroke Mother  ? Heart failure Mother  ? Heart disease Father  ? Heart attack Father  ? Diabetes Sister  ? Diabetes Brother   Addison Naegeli Solberg5/01/2022 2:06 PMElectronically Signed:Damianos Kokkinidis, MD 02/26/2022 2:06 PMBest Contact Information: [phone] Addendum : I saw and evaluated the patient. I agree with the findings and the plan of care as documented in Dr  Kokkinidis note.  The following are my additions:Mr Sievers came to our service via the MICU after he had been found to be hypotensive in the setting of him having more medications than he had been in the recent past. We have reviewed the medicines he is now on and the strategy is to restart the aldactone which is a small dose but no other BP lowering GDMT.HE should continue the Midodrine.Of note, he has significant amyloid autonomic dysfunction and caution should be applied when starting new medicines that may affect his BP. Certainly, I would suggest to avoid beta-blockers altogether.He is plugged in to see Dr Hyacinth Meeker in the clinic for his advanced cardiac amyloidosis. I will send this note to Dr Hyacinth Meeker.> spent coordinating his discharge including discussing the plan with the patient and his team and completing this note.

## 2022-02-26 NOTE — Plan of Care
Plan of Care Overview/ Patient Status    Pt to be discharged to home this afternoon. Pt A&O x 4, pleasant and cooperative. Discharge instructions, medications, weight monitoring and f/u appointments reviewed. Patient verbalizes understanding of all instructions and provided teach back. Weight 170.3 lbs prior to d/c.Tele and INT's removed prior to discharge. Discharged with belongings ( medications and phone and clothing) via w/c to private car with son at 1800.

## 2022-02-26 NOTE — Progress Notes
Cardiology Progress NoteAdmission Diagnosis: Shortness of breath [R06.02]Decompensated heart failure (HC Code) (HC CODE) (HC Code) [I50.9]Hypotension [U27.9]Interval History:  -transferred out of the MICU -feels better -BP normal this morning -slept with CPAP -wants to go home -aldactone on hold -on midodrine 10 mg TID Current Medications: Scheduled Meds:Current Facility-Administered Medications Medication Dose Route Frequency Provider Last Rate Last Admin ? allopurinol (ZYLOPRIM) 150 mg, allopurinoL (ZYLOPRIM) 300 mg  450 mg Oral Daily Chinnugounder, Sankar, MD   450 mg at 02/26/22 0909 ? bisacodyL (DULCOLAX) suppository 10 mg  10 mg Rectal Daily Hossin, Tamanna, MD     ? cephALEXin (KEFLEX) capsule 500 mg  500 mg Oral Q6H Hossin, Tamanna, MD   500 mg at 02/26/22 0602 ? insulin lispro (Admelog, HumaLOG) Sliding Scale (See admin instructions for dose) 1-18 Units  1-18 Units Subcutaneous TID [place]wood Levo Derry Skill, MD   1 Units at 02/24/22 1850 ? insulin lispro (Admelog, HumaLOG) Sliding Scale (See admin instructions for dose) 1-18 Units  1-18 Units Subcutaneous Nightly Derry Skill, MD     ? midodrine (PROAMATINE) tablet 10 mg  10 mg Oral TID WC Danella Deis, MD   10 mg at 02/26/22 0911 ? polyethylene glycol (MIRALAX) packet 17 g  17 g Oral BID Hossin, Tamanna, MD   17 g at 02/26/22 0910 ? rivaroxaban (XARELTO) tablet Tab 20 mg  20 mg Oral Daily (1700) Hossin, Tamanna, MD   20 mg at 02/25/22 1733 ? rosuvastatin (CRESTOR) tablet 5 mg  5 mg Oral Nightly Chinnugounder, Sankar, MD   5 mg at 02/25/22 2010 ? senna (SENOKOT) tablet 8.6 mg  1 tablet Oral Nightly Chinnugounder, Sankar, MD   8.6 mg at 02/25/22 2011 ? sodium chloride 0.9 % flush 3 mL  3 mL IV Push Q8H Derry Skill, MD   3 mL at 02/26/22 0602 ? [Held by provider] spironolactone (ALDACTONE) tablet 12.5 mg  12.5 mg Oral Daily Chinnugounder, Sankar, MD     ? tafamidis (VYNDAMAX) capsule 61 mg  61 mg Oral Daily @0800  Chinnugounder, Sankar, MD   61 mg at 02/26/22 0912 Continuous Infusions:PRN Meds:dextrose (GLUCOSE) oral liquid 15 g **OR** fruit juice **OR** skim milk, dextrose (GLUCOSE) oral liquid 30 g **OR** fruit juice, dextrose injection, dextrose injection, glucagon, melatonin, potassium bicarbonate-citric acid, sodium chlorideReview of Systems: ROS as per HPI, otherwise 12 ROS negative or noncontributory  Objective: Vitals  Vitals:  02/26/22 0911 BP:  Pulse: 65 Resp:  Temp:   Vitals Range: Last 24 hours Temp:  [97.3 ?F (36.3 ?C)-97.9 ?F (36.6 ?C)] 97.3 ?F (36.3 ?C)Pulse:  [49-76] 65Resp:  [13-21] 18BP: (90-147)/(51-117) 119/79SpO2:  [97 %-100 %] 100 % Last 3 Weight Wt Readings from Last 3 Encounters: 02/26/22 77.8 kg 04/25/21 93.2 kg 02/23/17 108.9 kg  Intake and output data Gross Totals (Last 24 hours) at 02/26/2022 1111Last data filed at 02/26/2022 0546Intake 358 ml Output 1345 ml Net -987 ml  Physical Exam: Physical ExamGEN: Alert, interactive, no distressNeuro: AAOx3 in NAD NECK: no JVD, no bruitCV: RRR, normal S1/S2 without murmurs, gallops or rubsCHEST: CTAB, no wheezes/rales/rhonchi ABD: Normal bowel sounds.EXT: Warm, well perfusedSkin: No rash Labs: BMP CBC/COAG Recent Labs Lab 05/04/231503 05/05/230437 05/06/230443 NA 131* 132* 129* K 4.1 5.1 4.8 CL 95* 98 97* CO2 24 22 20  BUN 59* 57* 42* CREATININE 1.80* 1.40* 1.30 CALCIUM 9.1 9.3 9.7 MG 2.6* 2.4 2.4 PHOS 3.9 3.7 3.6 Recent Labs Lab 05/02/231236 05/03/230420 05/04/231017 05/05/230437 05/06/230443 ALT 15   < > 9 13 11  AST 49*   < >  42* 43* 40* ALKPHOS 121   < > 124* 114 103 BILITOT 1.3*   < > 1.4* 1.0 1.0 BILIDIR 0.6*  --   --   --   --   < > = values in this interval not displayed.  Recent Labs Lab 05/04/231017 05/05/230436 05/06/230443 WBC 5.6 6.2 5.6 HGB 15.2 13.5 14.0 HCT 45.80 40.60 42.00 PLT 221 227 246  No results for input(s): INR, PTT in the last 168 hours. Cardiac Enzymes pBNP No results for input(s): TROPONINT, CKTOTAL, CKMB in the last 168 hours.Invalid input(s): TROPONINPOC No components found for: BTYPENATRIURNo results found for: BNP Cardiovascular Risk Factors (Lipid, A1C), TSH  No results found for: CHOL, HDL, LDL, TRIG Lab Results Component Value Date  HGBA1C 6.7 (H) 02/22/2022 No results found for: TSH Other Labs   Tele reviewed, afib intermittently.Echo  * Limited Echocardiogram~ * Moderately decreased left ventricular systolic function. The left ventricular ejection fraction calculated by biplane Simpson's was 38%.  No thrombus visualized in the left ventricle. Moderately increased left ventricular cavity size.  Severe concentric left ventricular hypertrophy.  Flattened septum in systole consistent with right ventricular pressure overload.~ * Moderately increased right ventricular cavity size.  Mildly decreased right ventricular systolic function.  Estimated right ventricular systolic pressure is 44 mmHg compatible with pulmonary hypertension. The right ventricular systolic pressure may be underestimated.~ * A bioprosthetic aortic valve is present.  Spectral doppler was not performed across the aortic valve on this study.~ * Mild mitral regurgitation.~ * Severe tricuspid regurgitation.~ * Hepatic vein flow shows systolic flow reversal.  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  His hospital course has been complicated by hypotension secondary to medication administration which is worsened by vasoplegia in the setting of amyloidosis and baseline orthostatic hypotension. He improved once he got back some fluid and the losartan / aldactone were stopped and he is now out of the MICU under the cardiology service Plan -No more losartan -hold aldactone -Midodrine 10 mg TID -Crestor 5 -Xarelto 20 daily -Tafamidis 61 mg daily -dc planning. He has appointments next weeks with general cardiology and amyloid clinic Plan to be discussed with Dr Grayland Ormond PRELIMINARY NOTE, FINAL PLAN PENDING ATTENDING Gloriajean Dell MD Ar[facility] Cardiovascular Medicine Fellow([place] as APP for the Cardiology Service)

## 2022-02-27 LAB — URINE CULTURE: BKR URINE CULTURE, ROUTINE: >=100000 — AB

## 2022-02-28 ENCOUNTER — Encounter: Admit: 2022-02-28 | Payer: PRIVATE HEALTH INSURANCE | Primary: Internal Medicine

## 2022-02-28 NOTE — Progress Notes
Heart Failure Nurse Navigator NoteGarrett Lacretia Nicks Bradley is a 66 y.o. male who presented to [facility] . Patient has a diagnosis of HFrEF. Most recent LVEF: 38% 02/23/2022 He is followed outpatient by DR. Hyacinth Meeker (new) for cardiology. His PCP is Dr. Oneita Hurt, Does Not Have A.Cardiologist consulted inpatient? yesThe following factor(s) contributed to this patient's admission: Progression of diseaseMedication Review:GDMT Pillar Medication Dose Comments ACEi/ARB/ARNI None ordered  No recommendations Beta Blocker None ordered  No recommendations SGLT2i None ordered  No recommendations MRA Spironolactone 25 mg daily No recommendations Other Relevant Medications Medication Dose Recommendation Diuretics torsemide 20 mg daily As needed for 3 lbs weight gain Navigator Interventions:Called patietn today as I was unable to meet him in [place]. He was discharged from [facility] Saturday and referral was placed for HF DM follow up. Patient is to see Dr. Hyacinth Meeker 5/16 but was admitted for SOB prior to appointment. per our conversation. patient is establishing car here in [place]. He was able to get his medications but does not have a scale so he is unsure if her has gained weight. He also had a complicated, short admission  And would appreciate seeing someone prior to his appointment with Dr. Hyacinth Meeker. We were able to schedule him to come to HF DM for some overall education on his heart failure and to assist with any medication adjustments that would be needed. He will also need a scale. He appreciated my call and will be here tomorrow in clinic. Barriers Identified:None identifiedDisposition: Home, self-careUpcoming Appointments:Cardiologist: dr. Hyacinth Meeker (new)Disease Management: HF Disease Management Clinic Description: Outpatient, nurse-driven program offering a tailored approach to best practices and clinical guidelines. Focuses on preventing readmission and empowering patients to be an active participant in their health.?	2-6 visits over the 30-90 days post discharge, alternating with patients' visits with primary cardiologist in order to act as another level of support and check-in with the patient.  ?	GDMT initiation, titration, and monitoring?	Same-day access available for IV diuretics for fluid overload (site-dependent)?	Patient education on diet, medication and exercise with oversight from a nurse practitioner?	Facilitates collaboration between HF, primary care, and other community providersAdditional Comments: Laural Roes, [facility] Heart Failure Nurse NavigatorYork [address] and SRC5/05/2022 5:12 PMPhone: [phone] or [phone]

## 2022-03-01 ENCOUNTER — Ambulatory Visit: Admit: 2022-03-01 | Payer: PRIVATE HEALTH INSURANCE | Attending: Internal Medicine | Primary: Internal Medicine

## 2022-03-01 DIAGNOSIS — E854 Organ-limited amyloidosis: Secondary | ICD-10-CM

## 2022-03-01 DIAGNOSIS — I5021 Acute systolic (congestive) heart failure: Secondary | ICD-10-CM

## 2022-03-01 LAB — BLOOD CULTURE   (BH GH L LMW YH)
BKR BLOOD CULTURE: NO GROWTH
BKR BLOOD CULTURE: NO GROWTH

## 2022-03-01 NOTE — Progress Notes
HEART FAILURE OFFICE VISIT NOTEPatient ID: Frank Bradley is a 66 y.o. male with PMHx of HFrEF (EF 38%), CAD s/p CABG (LIMA to LAD 2011), bicuspid aortic valve s/p AVR (2011), TTR cardiac amyloidosis, AFib s/p ablation (on Xarelto), OSA on CPAP, T2DM and CKD-III. He will establish with Dr. Hyacinth Meeker.[name] was hospitalized from 02/22/2022 to 02/26/2022 for ADHF. He was successfully diuresed. Unfortunately, he was overdiuresed and a vasodilator was started during his hospitalization which led to significant symptomatic hypotension. Losartan was stopped and diuresis was held. He was discharged on midodrine 10 mg TID, spironolactone 12.5 mg daily and prn torsemide for weight gain.[name] presents today for initial visit of disease management.VISIT DIAGNOSIS:1. Cardiac amyloidosis (HC Code) (HC CODE) (HC Code)  Ambulatory referral to Comprehensive Heart Failure  2. Acute systolic CHF (congestive heart failure) (HC Code) (HC CODE) (HC Code)  Ambulatory referral to Comprehensive Heart Failure   Reason for Visit: Chief Complaint Patient presents with ? DM Heart Failure Past Medical History: has a past medical history of Arrhythmia, Chronic coronary artery disease, Diabetes mellitus (HC Code), Encounter for blood transfusion, GERD (gastroesophageal reflux disease), Hypercholesteremia, Hypertension, Obstructive sleep apnea, and Syncope, psychogenic.INTERVAL HISTORY:Patient presents to clinic unaccompanied.  Patient reports adherence with medications as listed below.He complains of moderate fatigue and dyspnea with moderate activity.He denies dizziness, syncope/presyncope, headaches, chest pain, palpitations, orthopnea, bendopnea, cough, nausea, vomiting, diarrhea, abdominal bloating or edema. He is adherent with a low sodium diet with occasional dietary indiscretions. He is able to climb 2 flights of stairs before experiencing failure symptoms. He does not smoke, consume alcohol, or use illicit substances. He lives with family. He cooks all of the meals. He fills his pill box. He wears CPAP nightly.Today, he weighs 173.7 lbs, a 3.4 lb gain since discharge.He does not have a home scale. One was provided to him today.Review of Systems:Review of Systems Constitutional: Positive for malaise/fatigue. Respiratory: Positive for shortness of breath. Negative for cough.  Cardiovascular: Negative for chest pain, palpitations, orthopnea, leg swelling and PND. Gastrointestinal: Negative for diarrhea, nausea and vomiting. Neurological: Negative for dizziness and headaches. ALLERGIES:Allergies Allergen Reactions ? Penicillin G Swelling ? Atorvastatin  MEDICATIONS:Current Medications Medication Sig ? allopurinoL (ZYLOPRIM) 300 mg tablet Take 1 tablet (300 mg total) by mouth daily. ? cephALEXin (KEFLEX) 500 mg capsule Take 1 capsule (500 mg total) by mouth every 6 (six) hours for 4 days. ? JARDIANCE 10 mg tablet Take 1 tablet (10 mg total) by mouth daily before breakfast. ? magnesium oxide (MAG-OX) 400 mg (241.3 mg magnesium) tablet Take 1 tablet (400 mg total) by mouth daily. ? metFORMIN (GLUCOPHAGE) 500 MG Immediate Release tablet Take 1 tablet (500 mg total) by mouth 2 (two) times daily with breakfast and dinner. ? midodrine (PROAMATINE) 10 mg tablet Take 1 tablet (10 mg total) by mouth 3 (three) times daily with meals. Do not take any doseslater than evening meal or less than 4 hours before bedtime. ? multivitamin capsule Take 1 capsule by mouth daily. ? rivaroxaban (XARELTO) 20 mg Tab tablet 1 tablet (20 mg total) Daily @1700 . ? rosuvastatin (CRESTOR) 5 MG tablet Take 1 tablet (5 mg total) by mouth nightly. ? tafamidis (VYNDAMAX) 61 mg capsule Take 1 capsule (61 mg total) by mouth daily. ? vutrisiran (AMVUTTRA) 25 mg/0.5 mL Syrg syringe Inject 25 mg under the skin every 3 (three) months. PHYSICAL EXAM:Physical ExamConstitutional:     Appearance: Normal appearance. Neck:    Vascular: No hepatojugular reflux or JVD (JVP ~8 cm).  Cardiovascular:    Rate and Rhythm: Normal rate and regular rhythm.    Heart sounds: Normal heart sounds. Pulmonary:    Effort: Pulmonary effort is normal.    Breath sounds: Normal breath sounds. Abdominal:    General: There is no distension.    Palpations: Abdomen is soft.    Tenderness: There is no abdominal tenderness. Musculoskeletal:    Right lower leg: No edema.    Left lower leg: No edema. Skin:   General: Skin is warm and dry. Neurological:    Mental Status: He is alert. VITAL SIGNS:Vitals:  03/01/22 1138 BP: 114/64 Site: Left Arm Position: Sitting Cuff Size: Medium Pulse: (!) 50 Resp: 16 Temp: 97.5 ?F (36.4 ?C) TempSrc: No-touch scanner SpO2: 100% Weight: 78.8 kg Body mass index is 22.3 kg/m?Marland KitchenWt Readings from Last 3 Encounters: 02/26/22 77.2 kg 04/25/21 93.2 kg 02/23/17 108.9 kg Tests Review: INR: Lab Results Component Value Date  INR 1.48 (H) 10/08/2016 BMP: Lab Results Component Value Date  CALCIUM 9.7 02/26/2022  NA 129 (L) 02/26/2022  K 4.8 02/26/2022  CO2 20 02/26/2022  CL 97 (L) 02/26/2022  BUN 42 (H) 02/26/2022  CREATININE 1.30 02/26/2022 BNP: Lab Results Component Value Date  BNPPRO 1,297.0 (H) 02/25/2022  BNPPRO 1,486.0 (H) 02/24/2022  BNPPRO 2,711.0 (H) 02/22/2022 EKG: Sinus rhythm, RBBBIschemic Workup: ECHO: Results for orders placed or performed during the hospital encounter of 02/22/22 Echo 2D Ltd w Doppler and CFI if Ind Image Enhancement and or 3D Result Value Ref Range  Reported Biplane EF% 38 %  Narrative  ~ * Limited Echocardiogram* Moderately decreased left ventricular systolic function. The left ventricular ejection fraction calculated by biplane Simpson's was 38%.  No thrombus visualized in the left ventricle. Moderately increased left ventricular cavity size.  Severe concentric left ventricular hypertrophy.  Flattened septum in systole consistent with right ventricular pressure overload.* Moderately increased right ventricular cavity size.  Mildly decreased right ventricular systolic function.  Estimated right ventricular systolic pressure is 44 mmHg compatible with pulmonary hypertension. The right ventricular systolic pressure may be underestimated.* A bioprosthetic aortic valve is present.  Spectral doppler was not performed across the aortic valve on this study.* Mild mitral regurgitation.* Severe tricuspid regurgitation.* Hepatic vein flow shows systolic flow reversal.  IVC diameter > 2.1 cm that collapses < 50% with a sniff suggests high RAP (10-20 mmHg, mean 15 mmHg).* No significant pericardial effusion.* No prior transthoracic study available for comparison. Other Cardiac Imaging: Physiologic Cardiovascular Monitoring:Assessment: SETH HIGGINBOTHAM is a 66 y.o. male with a LVEF of ECHO LVEF Latest Ref Rng & Units 02/23/2022 Biplane % 38 .NYHA :Class III - Can complete >/= 2 METS .STEVENSON HEMODYNAMIC CLASSIFICATION (based on clinic exam):  warm and dry.6 minute walk:    View : No data to display.    Assessment  EVIDENCE-BASED REVIEW OF THERAPEUTICS:	1.) ACE-I, ARB, or ARNI:  Ace-1, ARB, ARNI   	2.) Approved Beta-Blocker:  Beta Blocker   None  	3.) Aldosterone antagonist:  Aldosterone Antagonist     Start End  spironolactone (ALDACTONE) 25 mg tablet    Class: Historical Med  	4.) Hydralazine-Isordil:  Hydralazine-Isordil Meds   None  	5.) SGLT2 Inhibitor:  SGLT2 Inhibitor     Start End  JARDIANCE 10 mg tablet 04/13/2021   Class: Historical Med                6.) Anticoagulation:  Anticoagulation     Start End  rivaroxaban (XARELTO) 20 mg Tab tablet 03/04/2016   Class: Historical Med  Edison Simon, CPHT 04/25/2021 12:10 PM Christiana Pellant, RN 06/02/2016  3:16 PMReceived from: [facility]     Diuretics: Torsemide 40 mg PRN	7.) Implantable Cardiac Defibrillator:  N/A	8.) Cardiac Resynchronization Therapy:  N/A			9.) Remote monitoring:  No              10.) Cardiomems Candidate: Could consider  ADVANCED THERAPEUTIC CONSIDERATIONS: none currentlyDIET AND FLUID INSTRUCTIONS: Recommended 2000 cc total daily fluid restriction and low-salt (2000 mg sodium) diet.Plan: HFrEF, EF 38%, NYHA Class IIIVolume status: euvolemic on examDiuretic Therapy: torsemide 40 mg prnChange: no medication changesContinue: spironolactone 12.5 mg daily, empagliflozin 10 mg tid, amvuttra 25 mg Q3Months, tafamidis 61 mg daily   Disease process and medications discussed. Questions answered fully.Emphasized salt restriction.Encouraged daily monitoring of the patient's weight.Labs ordered today. Labs as directed to assess electrolyte status, kidney statusTCC Follow Up: as neededCardiology Follow Up: 03/08/2022 Dr. Gracy Bruins Follow Up: Needs PCPElectronically Signed by Evonnie Pat, APRN, Mar 01, 2022

## 2022-03-01 NOTE — Patient Instructions
Please take all medications as prescribed with the following changes:NO CHANGES IN MEDICATIONS AT THIS [facility] contact your Provider prior to taking any OTC medications.Please bring a complete medication list to every appointment or bring your medication bottles in to the visit.Continue a 2 gram Sodium diet.Keep fluids to 2 liters daily .Weigh yourself daily and keep a record.Please call your Disease Management if you gain 2-3 lbs in a day or 5 lbs in a week [phone],  Monday thru Friday 8am-4:30p: after business hours, please call your referring provider/cardiologist.Please keep your Cardiologist appointment, which is scheduled.Electronically Signed by [name], RN, Mar 01, 2022

## 2022-03-01 NOTE — ED Provider Notes
Chief Complaint Patient presents with ? Shortness of Breath   C/o sob x few days ,no chest pain.+slight cough.no fever. Pt on diuretics, no leg edema ================================================================================================ Emergency Medicine Resident Note (PGY-1) ================================================================================================HPI:Frank Bradley is a 66 y.o. male w/ PMH of cardiac amyloidosis, HTN, CAD, atrial fibrillation s/p ablation, acute systolic heart failure, CABG, R6EA, and OSA who presents to the ED with chief complaint of shortness of breath. Associated symptoms: dry cough, baseline constipation with medications and lasix he takes daily, numbness in distal fingersOnset:chronic but worsening over the past weeks agoDuration: intermittent and worsening in frequency but not severityCharacterization: exertional shortness of breathAggravating/Alleviating/Modifying factors: exertionTiming: intermittentSeverity and progression: moderate to severe, only able to walk up a single fight of stairs without additional shortness of breathPrevious occurrences: multiple previous occurrences of shortness of breath which felt similarPatient denies/ROS negative for (-) fever, chills, headaches, dizziness, numbness, tingling, chest pain, nausea, emesis, diarrhea, near syncope (within the past few weeks)Frank Bradley is a 66 year old male with cardiac amyloidosis and OSA without other lung diseases but extensive cardiac history presenting for increasing requency of shortness of breath.  Pertinent PE findings:BP 115/78  - Pulse (!) 57  - Temp 97.4 ?F (36.3 ?C) (Oral)  - Resp 20  - Ht 6' 2 (1.88 m)  - Wt 77.2 kg  - SpO2 100%  - BMI 21.87 kg/m? Gen: elderly male in no acute distress sitting on the stretcherNeuro: alert and oriented x4, moving all extremities wellHEENT: normocephalic, atraumaticCV: no murmurs rubs or gallops, regular rate and rhythm, pulses intact bilaterallyPulm: clear to auscultation bilaterally, no focal lung sounds or wheezing notedAbd: soft, not tender, and not distendedMSK: no pulmonary edemaSkin: no erythema or indurationPhysical exam otherwise stated aboveDifferential diagnoses: Acute exacerbation of heart failure, cardiac amyloidosis, pneumonia, MI, pleural effusion, PEConsidered AEHF, ordered CXR, BNP, and patient received double home dose of lasixConsidered cardiac amyloidosis, cardiology consulted for further recommendationsConsidered and less likely pneumonia, pleural effusion, and PE. CXR ordered and wells score calculatedPlan:Symptomatic management with: lasixDiagnostic testing ordered includes: Bmp, CBC, Hepatic function panel, MAG, Troponin, BNP, Covid, chem8, Consults:I spoke with the cardiology fellow who will come down to see the patientED Course: Patient does not appear to be clinically fluid overloaded on exam yet bnp of 2711Lasix providedPlan for admission following cardiology recommendationsNotable diagnostics:Abnormal: troponin 122->101->116, bnp 2711, Creatinine 1.8 from 1.43 most recent previous, Normal: WBCComments as of 03/01/22 1116 Tue Feb 22, 2022 1356 NT-proBNP(!): 2,711.0Up from priro Hx cardiac amyloidosis  [NN] 1356 High Sensitivity Troponin T(!!): 122Up from prior in setting of hf, poor gfrReassuring ekg.  [NN] 1551 Pulm consulted by residents, no known pulm involvement, recommend [place] to r/o pulm involvementWill dw cards. Giving furosemide and will reassess symptoms.  [NN]  Comments User Index[NN] Susy Manor, MD   Clinical Impressions as of 03/01/22 1116 Shortness of breath Acute systolic CHF (congestive heart failure) (HC Code) (HC CODE) (HC Code) Dispo:Based on the findings above, patient will be admitted.Patient care and medical decision making occurred with oversight by Vallarie Mare, MDPGY1 Emergency MedicineAvailable through MHB5/06/2022 12:02 PMThis note may have been generated using M*Modal voice dictation software please excuse any typographical errors due to flaws in voice recognition.This encounter occurred within the context of the Covid-19 pandemic. --------------------------------------------------------------------------------------------------------------------------------Medical Decision MakingAmount and/or Complexity of Data ReviewedLabs: ordered. Decision-making details documented in ED Course.Radiology: ordered.ECG/medicine tests: ordered.RiskPrescription drug management.Decision regarding hospitalization.ProceduresAttestation/Critical CarePatient Reevaluation: @@@@@@@@@@@@@@@@@@@@@I  signed up for this patient in case there were questions during their time in the ED after admission per administrative protocol  but was not actively involved in their care.Earlean Polka, MDComments as of 03/01/22 1116 Tue Feb 22, 2022 1356 NT-proBNP(!): 2,711.0Up from priro Hx cardiac amyloidosis  [NN] 1356 High Sensitivity Troponin T(!!): 122Up from prior in setting of hf, poor gfrReassuring ekg.  [NN] 1551 Pulm consulted by residents, no known pulm involvement, recommend [place] to r/o pulm involvementWill dw cards. Giving furosemide and will reassess symptoms.  [NN]  Comments User Index[NN] Susy Manor, MD   Clinical Impressions as of 03/01/22 1116 Shortness of breath Acute systolic CHF (congestive heart failure) (HC Code) (HC CODE) (HC Code)   Attending Supervised: ResidentI saw and examined the patient. I agree with the findings and plan of care as documented in the resident's note except as noted below. Additional acute and/or chronic problems addressed: Frank Highley Neumann81 year old male with history of CAD status post CABG, HTN, HLD, CHF with last EF being around 45%, diabetes, OSA on CPAP p/w SOB.  Patient reports he is had exertional shortness of breath worsening over the last 7 days.  No fever, chills.  No cough, congestion.  No pleuritic symptoms.  No frank chest pain.  Reports this feels similar to when he is had heart problems in the past.  Previously been to hospitalist Medicine, not cardiology.  Denies leg swelling.  No pleuritic symptoms. +Rivaroxaban.  On examination heart lungs clear to auscultation.  No edema noted on exam.  2+ pulses to bilateral upper and lower extremities.  No abdominal tenderness to palpation.  66 year old male with history as noted above presenting with exertional shortness of breath, concern for worsening underlying cardiac dysfunction.  Labs suggestive of degree of HF/cardiac dysfunction. No clear anginal symptoms or MI symptoms, aspirin load not administered (pt also on ASA daily). Will diurese and assess for improvement of symptoms; if no improvement will reconsider eval for PE, low suspicion given hx and pt on DOAC.This note was prepared using MModal dictation software.  All efforts were made to assure accuracy, please excuse any typos, word substitutions, or misspellings. ED DispositionadmitAdmit Ramonita Lab, MDResident05/02/23 1846 Hollace Kinnier, MD05/05/23 0926 Susy Manor, MD05/09/23 1116

## 2022-03-01 NOTE — Progress Notes
See APP note for full patient assessment and evaluationHeart Failure Disease Management Supplemental Nursing NoteReason for VisitGarrett W Bradley (DOB 05-26-1956) presents for Initial Disease Management visit.Patient's last inpatient admission was 02/22/22-02/26/22 for SOB and hypovolemic, hypotensive shock.  he is followed by Dr. Hyacinth Meeker for cardiology first appointment 5/16/23his PCP is Dr. Rejeana Brock is followed by no home nursing servicesBackground:HFrEF:EF 44  %Frank Bradley's discharge weight was 170.3 lbs on 5/6/23Social:Patient lives with: familyPatient has a scale at home: given one in clinic todayBarriers to compliance: noPatient has help at home?: yesPt is a smoker: noPt drinks alcohol: occasional Patient uses recreational drugs: noNursing Note:Patient reports: moderate fatigue and SOB with moderate activity.Current clinic weight: 173.7 lbs.Subjective DM  Flowsheet Row Patient Specific Responses Subjective DM  Cardiovascular complaints (choose all that apply): None Dizziness/Lightheadedness (choose all that apply): Denies GI Symptoms (choose all that apply): Denies Fatigue: Moderate Lower extremity edema (choose all that apply): Denies SOB/DOE: With moderate to great exertion How far can pt walk on level land before becoming SOB: 1 block How many flights of stairs can pt climb before becoming SOB: 2 Flights Orthopnea (choose all that apply): Pt needs to elevate HOB or use pillows < 30 degress Other Respiratory (choose all that apply): Denies Difficulty with prescribed medications (choose all that apply): No Daily home weights: No  [INSTRUCTED TO TAKE DAILY WEIGHTS AND RECORD.  PT GIVEN SCALE IN CLINIC TODAY.] Dietary sodium restriction: Making effort, but has occasional dietary indiscretions Compliance with CPaP/BiPaP: Always Response to diuretics: Not on diuretics  [AS NEEDED TORSEMIDE]  Education and CoordinationGarrett W Bradley educated and counseled for 90 minutes on the following:Weigh your self daily, Have a scale at home, Was given a scale in clinic today, Low Sodium diet, Shopping cart of food with labels reviewed, Heart failure disease, treatment and progression, Reviewed medication dose, purpose and potential side effects, Importance of medication compliance, Exercise in heart failure, Importance of adhering to follow-up office visits, Signs and symptoms of worsening heart failure and when to call the office, Zones sheet provided to patient Patient education assessment performed at each clinic visit and upon condition changes. Assessment will be documentation under the educational tab in Epic.Electronically Signed by [name], RN, May 9, 2023Electronically Signed by [name], RN, May 9, 2023Electronically Signed by [name], RN, Mar 01, 2022

## 2022-03-02 ENCOUNTER — Telehealth: Payer: Self-pay

## 2022-03-02 ENCOUNTER — Other Ambulatory Visit: Payer: Self-pay

## 2022-03-02 DIAGNOSIS — I43 Cardiomyopathy in diseases classified elsewhere: Secondary | ICD-10-CM

## 2022-03-02 DIAGNOSIS — E8881 Metabolic syndrome: Secondary | ICD-10-CM

## 2022-03-02 DIAGNOSIS — R932 Abnormal findings on diagnostic imaging of liver and biliary tract: Secondary | ICD-10-CM

## 2022-03-02 NOTE — Telephone Encounter (Signed)
Pt was scheduled for an Ultrasound Abdomen Complete w/ Elastagraphy at [facility] on 03/09/2022 at 10:00 AM. Pt to arrive at 9:45 AM. NPO past midnight: ?Unable to contact pt or leave voice mail due to pt voice mail box being Full. ? ? ?

## 2022-03-02 NOTE — Telephone Encounter (Signed)
-----   Message from Marlon Pel, RN sent at 03/02/2022  3:39 PM EDT ----- ?See results from 6/22 Carlean Purl ?----- Message ----- ?From: Timothy Lasso, RN ?Sent: 03/01/2022  12:00 AM EDT ?To: Marlon Pel, RN ? ? ultrasound and hepatic elastography repeated next year 1 year from now  ? ? ?

## 2022-03-03 NOTE — Telephone Encounter (Signed)
Pt due for Follow Up US ?Pt was scheduled for an Ultrasound Abdomen Complete w/ Elastagraphy at [facility] on 03/09/2022 at 10:00 AM. Pt to arrive at 9:45 AM. NPO past midnight: ?Pt was notified of the request to repeat the [place]. Pt stated that he has moved away from [place]: Pt stated that he will call back in the future to request GI records once he finds a new GI Dr.  ?Radiology Department notified and procedure canceled:  ?

## 2022-03-08 ENCOUNTER — Ambulatory Visit: Admit: 2022-03-08 | Payer: PRIVATE HEALTH INSURANCE | Attending: Cardiovascular Disease | Primary: Internal Medicine

## 2022-03-08 ENCOUNTER — Encounter: Admit: 2022-03-08 | Payer: PRIVATE HEALTH INSURANCE | Attending: Cardiovascular Disease | Primary: Internal Medicine

## 2022-03-08 ENCOUNTER — Encounter: Admit: 2022-03-08 | Payer: PRIVATE HEALTH INSURANCE | Primary: Internal Medicine

## 2022-03-08 ENCOUNTER — Inpatient Hospital Stay: Admit: 2022-03-08 | Discharge: 2022-03-08 | Payer: PRIVATE HEALTH INSURANCE | Primary: Internal Medicine

## 2022-03-08 DIAGNOSIS — I509 Heart failure, unspecified: Secondary | ICD-10-CM

## 2022-03-08 DIAGNOSIS — Z5189 Encounter for other specified aftercare: Secondary | ICD-10-CM

## 2022-03-08 DIAGNOSIS — I499 Cardiac arrhythmia, unspecified: Secondary | ICD-10-CM

## 2022-03-08 DIAGNOSIS — I2510 Atherosclerotic heart disease of native coronary artery without angina pectoris: Secondary | ICD-10-CM

## 2022-03-08 DIAGNOSIS — I10 Essential (primary) hypertension: Secondary | ICD-10-CM

## 2022-03-08 DIAGNOSIS — G4733 Obstructive sleep apnea (adult) (pediatric): Secondary | ICD-10-CM

## 2022-03-08 DIAGNOSIS — E854 Organ-limited amyloidosis: Secondary | ICD-10-CM

## 2022-03-08 DIAGNOSIS — I43 Cardiomyopathy in diseases classified elsewhere: Secondary | ICD-10-CM

## 2022-03-08 DIAGNOSIS — F488 Other specified nonpsychotic mental disorders: Secondary | ICD-10-CM

## 2022-03-08 DIAGNOSIS — K219 Gastro-esophageal reflux disease without esophagitis: Secondary | ICD-10-CM

## 2022-03-08 DIAGNOSIS — E119 Type 2 diabetes mellitus without complications: Secondary | ICD-10-CM

## 2022-03-08 DIAGNOSIS — E7800 Pure hypercholesterolemia, unspecified: Secondary | ICD-10-CM

## 2022-03-08 LAB — COMPREHENSIVE METABOLIC PANEL
BKR A/G RATIO: 1.1 (ref 1.0–2.2)
BKR ALANINE AMINOTRANSFERASE (ALT): 11 U/L (ref 9–59)
BKR ALBUMIN: 4.3 g/dL (ref 3.6–4.9)
BKR ALKALINE PHOSPHATASE: 120 U/L (ref 9–122)
BKR ANION GAP: 10 (ref 7–17)
BKR ASPARTATE AMINOTRANSFERASE (AST): 44 U/L — ABNORMAL HIGH (ref 10–35)
BKR AST/ALT RATIO: 4
BKR BILIRUBIN TOTAL: 1.2 mg/dL (ref <=1.2)
BKR BLOOD UREA NITROGEN: 32 mg/dL — ABNORMAL HIGH (ref 8–23)
BKR BUN / CREAT RATIO: 20 (ref 8.0–23.0)
BKR CALCIUM: 10.1 mg/dL (ref 8.8–10.2)
BKR CHLORIDE: 100 mmol/L (ref 98–107)
BKR CO2: 27 mmol/L (ref 20–30)
BKR CREATININE: 1.6 mg/dL — ABNORMAL HIGH (ref 0.40–1.30)
BKR EGFR, CREATININE (CKD-EPI 2021): 47 mL/min/{1.73_m2} — ABNORMAL LOW (ref >=60)
BKR GLOBULIN: 3.9 g/dL — ABNORMAL HIGH (ref 2.3–3.5)
BKR GLUCOSE: 79 mg/dL (ref 70–100)
BKR POTASSIUM: 4.5 mmol/L (ref 3.3–5.3)
BKR PROTEIN TOTAL: 8.2 g/dL (ref 6.6–8.7)
BKR SODIUM: 137 mmol/L (ref 136–144)

## 2022-03-08 LAB — NT-PROBNPE: BKR B-TYPE NATRIURETIC PEPTIDE, PRO (PROBNP): 2249 pg/mL — ABNORMAL HIGH (ref <125.0)

## 2022-03-08 LAB — PREALBUMIN: BKR PREALBUMIN: 6.2 mg/dL — ABNORMAL LOW (ref 20.0–40.0)

## 2022-03-08 LAB — TROPONIN T HIGH SENSITIVITY, NO REFLEX (BH GH LMW YH): BKR TROPONIN T HS NO REFLEX: 88 ng/L — ABNORMAL HIGH

## 2022-03-08 NOTE — Progress Notes
[employer] of MedicineSection of Cardiovascular MedicineYale Cardiomyopathy ProgramReferring Provider:Referral MDDiagnosis:Primary cardiologist:PCP:Chief Complaint:History of Present Illness: Frank Bradley is a 66 y.o. 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  He was previou seen at the [place] [facility] [place].  He presented with exertional dyspnea.  He was diuresed and became hypotensive.  He required transient ICU evaluation.  He presents today for his 1st clinic visit.  He is taking his medications and feels at his baseline but he does have significant exertional dyspnea.?He is previously received the majority of his cardiac care in [place].  His most recent echo had been performed in February 2023.  At that time his EF was 45-50%.  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  He states he is also on a 2nd amyloid medication, but is unsure of the name.  He is previously been noted to have orthostatic hypotension and was on midodrine 5 mg t.i.d. since December.  He states he also had EMG testing which was abnormal.?He recently moved to [place] and was about to establish care at [facility] in infiltrative cardiomyopathy Clinic, but developed worsening shortness of breath on exertion even with walking to the bathroomAmyloid Specific Clinical History:	N/ARuptured Biceps Tendon: NoSpinal Stenosis: YesNeuropathy CA: Autonomic and PeripheralFamily history of amyloidosis: NoFamily history of unexplained neuropathy: YesOrthostatic hypotension: YesBiopsy proven Cardiac Amyloidosis: NoECG suggestive of CA: YesEcho suggestive of CA: YesMRI suggestive of CA: YesFamily history of CA: NoHeart failure CA: HFrEFAortic stenosis: Surgery Atrial fibrillation/ atrial flutterHypertension: YesHypotension: YesCKD: YesESRD: NoH/o MI: NoACEi intolerance: YesBB intolerance: YesPast Medical History:- Past Medical History: Diagnosis Date ? Arrhythmia  ? Chronic coronary artery disease  ? Diabetes mellitus (HC Code)  ? Encounter for blood transfusion  ? GERD (gastroesophageal reflux disease)  ? Hypercholesteremia  ? Hypertension  ? Obstructive sleep apnea  ? Syncope, psychogenic  Past Surgical History:- Past Surgical History: Procedure Laterality Date ? CERVICAL DISC ARTHROPLASTY   ? TOOTH EXTRACTION   ? VALVE REPLACEMENT   Family History:- Family History Problem Relation Age of Onset ? Heart disease Mother  ? Diabetes Mother  ? Stroke Mother  ? Heart failure Mother  ? Heart disease Father  ? Heart attack Father  ? Diabetes Sister  ? Diabetes Brother  Social History:- Social History Tobacco Use ? Smoking status: Former   Types: Cigarettes ? Smokeless tobacco: Never ? Tobacco comments:   Quit early 1980's Substance Use Topics ? Alcohol use: Yes ? Drug use: No  Race: BlackEthnicity: BlackFinancial insecurity:NoFood insecurity: NoTransportation barriers: NoHome medications: Patient's Medications New Prescriptions  No medications on file Previous Medications  ALLOPURINOL (ZYLOPRIM) 300 MG TABLET    Take 1 tablet (300 mg total) by mouth daily.  COLCHICINE (COLCRYS) 0.6 MG TABLET Take 1 tablet (0.6 mg total) by mouth 2 (two) times daily as needed.  DOCOSAHEXANOIC ACID/EPA (FISH OIL ORAL)    Take 1 capsule by mouth daily.  JARDIANCE 10  MG TABLET    Take 1 tablet (10 mg total) by mouth daily before breakfast.  LANSOPRAZOLE (PREVACID) 15 MG CAPSULE    Take 1 capsule (15 mg total) by mouth daily as needed.  MAGNESIUM OXIDE (MAG-OX) 400 MG (241.3 MG MAGNESIUM) TABLET    Take 1 tablet (400 mg total) by mouth daily.  METFORMIN (GLUCOPHAGE) 500 MG IMMEDIATE RELEASE TABLET    Take 1 tablet (500 mg total) by mouth 2 (two) times daily with breakfast and dinner.  MIDODRINE (PROAMATINE) 10 MG TABLET    Take 1 tablet (10 mg total) by mouth 3 (three) times daily with meals. Do not take any doseslater than evening meal or less than 4 hours before bedtime.  MULTIVITAMIN CAPSULE    Take 1 capsule by mouth daily.  RIVAROXABAN (XARELTO) 20 MG TAB TABLET    1 tablet (20 mg total) Daily @1700 .  ROSUVASTATIN (CRESTOR) 5 MG TABLET    Take 1 tablet (5 mg total) by mouth nightly.  SPIRONOLACTONE (ALDACTONE) 25 MG TABLET    Take 0.5 tablets (12.5 mg total) by mouth daily.  TADALAFIL, PULM. HYPERTENSION, (ADCIRCA) 20 MG TAB TABLET    TAKE 1 TABLET BY MOUTH DAILY AS NEEDED  TAFAMIDIS (VYNDAMAX) 61 MG CAPSULE    Take 1 capsule (61 mg total) by mouth daily.  TORSEMIDE (DEMADEX) 20 MG TABLET    Take 2 tablets (40 mg total) by mouth daily as needed. Take torsemide only if weight gain > 3 pounds from your baseline. If at your baseline weight, do not take the torsemide.  VUTRISIRAN (AMVUTTRA) 25 MG/0.5 ML SYRG SYRINGE    Inject 25 mg under the skin every 3 (three) months. Modified Medications  No medications on file Discontinued Medications  No medications on file Review of Systems: ROSPhysical Exam: Vital Signs : BP: 110/76 (03/08/2022 11:31 AM), Pulse: 70 (03/08/2022 11:31 AM), SpO2: 98 % (03/08/2022 11:31 AM), Pain Score: Zero (03/08/2022 11:31 AM), Height: 6' 2 (1.88 m) (03/08/2022 11:31 AM), Weight: 77.1 kg (03/08/2022 11:31 AM), Weight (lbs): 170 (03/08/2022 11:31 AM), BMI (Calculated): 21.8 (03/08/2022 11:31 AM) Vitals:  03/08/22 1131 BP: 110/76 Site: Right Arm Position: Sitting Cuff Size: Medium Pulse: 70 SpO2: 98% Weight: 77.1 kg Height: 6' 2 (1.88 m)  Physical ExamConstitutional:     General: He is not in acute distress.   Appearance: He is ill-appearing. HENT:    Head: Normocephalic.    Nose: Nose normal.    Mouth/Throat:    Mouth: Mucous membranes are moist. Eyes:    Pupils: Pupils are equal, round, and reactive to light. Cardiovascular:    Rate and Rhythm: Normal rate.    Heart sounds: Normal heart sounds. Pulmonary:    Effort: Pulmonary effort is normal. Abdominal:    General: Abdomen is flat. Musculoskeletal:       General: No swelling. Psychiatric:       Mood and Affect: Mood normal.       Thought Content: Thought content normal. General Appearance:  Alert, cooperative, no distress, appears stated age  Head:  Normocephalic, without obvious abnormality, atraumatic Neck: Supple, symmetrical, trachea midline; no carotid bruit or JVD. JVP appears normal with no abnormal wave forms, Respiratory:   Clear to auscultation bilaterally, respirations unlabored, No rales, wheezes or rhonchi  Cardiovascular:  PMI in 5th interspace MCL, normal S1 and S2, no murmurs, no rubs or gallops  Abdomen:   Soft, non-tender, bowel sounds active, no masses, no organomegaly, no pulsatile masses,  Extremities: No varicosities, no cyanosis or  edema, no other lesions  Pulses: 2+ and symmetric all extremities,  pedal pulses 2+ bilaterally  Skin: Skin color, texture, turgor normal, no rashes or lesions  Neurologic/Psychiatric:  mood and affect appropriate, oriented to time/place and person  Musculoskeletal: No kyphosis or scoliosis, gait normal, good muscle tone.  Labs: Labs:@LABRCNTIP (wbc:3,hgb,plt)@ No results found for: IMMUNOGLOBUL, FREELAMBDA, FKAPLAMRATIO @LABRCNTIP (na:3,k,cl,co2,BUN,creatinine,glu,aniongap)@ @LABRCNTIP (calcium:3,mg,phos,PREALBUMIN)@ No results found for: TROPONINTLab Results Component Value Date  BNPPRO 1,297.0 (H) 02/25/2022  BNPPRO 1,486.0 (H) 02/24/2022  BNPPRO 2,711.0 (H) 02/22/2022  Lab Results Component Value Date  LABPROT 14.4 (H) 10/08/2016  INR 1.48 (H) 10/08/2016  Imaging: Echo :Results for orders placed or performed during the hospital encounter of 02/22/22 Echo 2D Ltd w Doppler and CFI if Ind Image Enhancement and or 3D Result Value Ref Range  Reported Biplane EF% 38 %  Narrative  ~ * Limited Echocardiogram* Moderately decreased left ventricular systolic function. The left ventricular ejection fraction calculated by biplane Simpson's was 38%.  No thrombus visualized in the left ventricle. Moderately increased left ventricular cavity size.  Severe concentric left ventricular hypertrophy.  Flattened septum in systole consistent with right ventricular pressure overload.* Moderately increased right ventricular cavity size.  Mildly decreased right ventricular systolic function.  Estimated right ventricular systolic pressure is 44 mmHg compatible with pulmonary hypertension. The right ventricular systolic pressure may be underestimated.* A bioprosthetic aortic valve is present.  Spectral doppler was not performed across the aortic valve on this study.* Mild mitral regurgitation.* Severe tricuspid regurgitation.* Hepatic vein flow shows systolic flow reversal.  IVC diameter > 2.1 cm that collapses < 50% with a sniff suggests high RAP (10-20 mmHg, mean 15 mmHg).* No significant pericardial effusion.* No prior transthoracic study available for comparison. PYP :No results found for this or any previous visit.MRI :EKG: Assessment and Plan: GERAL COKER is a 66 y.o. 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  He has NYHA class 3 CHF symptoms.  He has polyneuropathy symptoms with possible autonomic dysfunction and peripheral neuropathy.  He is currently taking TTR stabilizers and TTR suppressing agents.  He was recently hospitalized for acute on chronic systolic and diastolic heart failure with diuresis.  We will perform laboratory testing today to check his electrolytes and kidney levels.  We will check his pre-albumin level to see if he is having effective TTR knock down.He brought up the issue of possible referral for heart transplantation.  Based on recent clinical course and the severity of his symptoms, I think this is reasonable to start considering.  We will perform cardiopulmonary exercise testing.  I will refer him to social work.  We will see where these results take [place] and then see him back in 4 to 6 weeks.Regarding his medical therapy, he should continue on his torsemide, Aldactone, in his anti TTR agents.  I also asked him to take a multivitamin with vitamin-A given his TTR suppressing agents.  I will also fill out paperwork to transfer over his Amvuttra to Garen Lah will see him back in approximately 4 weeks but told him to call our office if he has any worsening shortness of breath and we can get him in soonerOrgan involvement: Organ involvement: Cardiac, Neuropathy - Autonomic and Neuropathy - PeripheralNYHA class: 3Genetic testing: Genetic Testing [place]: Saunders Revel, MD PhDAssociate Professor of Medicine & Radiology and Biomedical ImagingCardiology AttendingCardiomyopathy Clinic:  [phone].[email or web address]

## 2022-03-08 NOTE — Patient Instructions
I will refer you for a social work evaluation.  I will also refer you for a cardiopulmonary test.  I want to get blood tests drawn on the way out the door today.  I want to start taking a multivitamin every day that has vitamin-A in it.  I will see you back in about 4 to 6 weeks to follow these things up.  No other medication changes today. I will start the Amvuttra transfer to [facility].

## 2022-03-09 ENCOUNTER — Ambulatory Visit (HOSPITAL_COMMUNITY): Payer: Medicare HMO

## 2022-03-09 ENCOUNTER — Telehealth: Payer: Self-pay | Admitting: Neurology

## 2022-03-09 LAB — IMMUNOFIXATION ELECTROPHORESIS, SERUM

## 2022-03-09 LAB — FREE KAPPA LAMBDA WITH RATIO, SERUM
BKR KAPPA FLC, SERUM: 6.83 mg/dL — ABNORMAL HIGH (ref 0.33–1.94)
BKR KAPPA/LAMBDA FLC RATIO, SERUM: 1.92 — ABNORMAL HIGH (ref 0.26–1.65)
BKR LAMBDA FLC, SERUM: 3.55 mg/dL — ABNORMAL HIGH (ref 0.57–2.63)

## 2022-03-09 NOTE — Telephone Encounter (Signed)
[address]103 in lab(CPAP) sleep study was denied by [facility].  ? ?[id number] no Josem Kaufmann is req through [employer] spoke to [place] Ref # [id number].  ?

## 2022-03-09 NOTE — Telephone Encounter (Signed)
I spoke to the patient he informed me he has moved to [place]. Patient will complete study there.  ?

## 2022-03-15 LAB — IMMUNOFIXATION ELECTROPHORESIS, URINE, RANDOM     (BH YH)

## 2022-03-22 ENCOUNTER — Encounter: Admit: 2022-03-22 | Payer: PRIVATE HEALTH INSURANCE | Attending: Cardiovascular Disease | Primary: Internal Medicine

## 2022-03-29 ENCOUNTER — Inpatient Hospital Stay: Admit: 2022-03-29 | Discharge: 2022-03-29 | Payer: PRIVATE HEALTH INSURANCE | Primary: Internal Medicine

## 2022-03-29 DIAGNOSIS — E854 Organ-limited amyloidosis: Secondary | ICD-10-CM

## 2022-03-29 DIAGNOSIS — I43 Cardiomyopathy in diseases classified elsewhere: Secondary | ICD-10-CM

## 2022-04-04 ENCOUNTER — Telehealth: Admit: 2022-04-04 | Payer: PRIVATE HEALTH INSURANCE | Attending: Cardiovascular Disease | Primary: Internal Medicine

## 2022-04-04 NOTE — Telephone Encounter
Pt calling to get stress test results 

## 2022-04-08 ENCOUNTER — Encounter: Admit: 2022-04-08 | Payer: PRIVATE HEALTH INSURANCE | Attending: Urology | Primary: Internal Medicine

## 2022-04-08 DIAGNOSIS — N529 Male erectile dysfunction, unspecified: Secondary | ICD-10-CM

## 2022-04-11 ENCOUNTER — Ambulatory Visit: Admit: 2022-04-11 | Payer: PRIVATE HEALTH INSURANCE | Attending: Urology | Primary: Internal Medicine

## 2022-04-11 ENCOUNTER — Encounter: Admit: 2022-04-11 | Payer: PRIVATE HEALTH INSURANCE | Attending: Urology | Primary: Internal Medicine

## 2022-04-11 DIAGNOSIS — F488 Other specified nonpsychotic mental disorders: Secondary | ICD-10-CM

## 2022-04-11 DIAGNOSIS — N529 Male erectile dysfunction, unspecified: Secondary | ICD-10-CM

## 2022-04-11 DIAGNOSIS — K219 Gastro-esophageal reflux disease without esophagitis: Secondary | ICD-10-CM

## 2022-04-11 DIAGNOSIS — E7800 Pure hypercholesterolemia, unspecified: Secondary | ICD-10-CM

## 2022-04-11 DIAGNOSIS — G4733 Obstructive sleep apnea (adult) (pediatric): Secondary | ICD-10-CM

## 2022-04-11 DIAGNOSIS — E119 Type 2 diabetes mellitus without complications: Secondary | ICD-10-CM

## 2022-04-11 DIAGNOSIS — I10 Essential (primary) hypertension: Secondary | ICD-10-CM

## 2022-04-11 DIAGNOSIS — I2510 Atherosclerotic heart disease of native coronary artery without angina pectoris: Secondary | ICD-10-CM

## 2022-04-11 DIAGNOSIS — Z5189 Encounter for other specified aftercare: Secondary | ICD-10-CM

## 2022-04-11 DIAGNOSIS — I499 Cardiac arrhythmia, unspecified: Secondary | ICD-10-CM

## 2022-04-11 MED ORDER — TADALAFIL 20 MG TABLET
20 mg | Status: AC | PRN
Start: 2022-04-11 — End: 2022-05-17

## 2022-04-11 MED ORDER — LORATADINE 10 MG TABLET
10 mg | ORAL | Status: AC
Start: 2022-04-11 — End: 2022-05-17

## 2022-04-11 MED ORDER — ACETAMINOPHEN 325 MG TABLET
325 mg | ORAL | Status: AC
Start: 2022-04-11 — End: 2022-05-17

## 2022-04-11 MED ORDER — NYSTATIN 100,000 UNIT/GRAM TOPICAL OINTMENT
100000 unit/gram | Status: SS
Start: 2022-04-11 — End: 2022-06-30

## 2022-04-11 MED ORDER — DOXYCYCLINE HYCLATE 100 MG TABLET
100 mg | Status: SS
Start: 2022-04-11 — End: 2022-06-30

## 2022-04-11 MED ORDER — AMMONIUM LACTATE 12 % TOPICAL CREAM
12 % | Status: AC
Start: 2022-04-11 — End: ?

## 2022-04-11 MED ORDER — MIDODRINE 10 MG TABLET
10 mg | Freq: Three times a day (TID) | ORAL | Status: AC
Start: 2022-04-11 — End: ?

## 2022-04-11 MED ORDER — ONETOUCH VERIO TEST STRIPS
Status: AC
Start: 2022-04-11 — End: ?

## 2022-04-11 MED ORDER — SILDENAFIL 100 MG TABLET
100 mg | ORAL_TABLET | ORAL | 4 refills | Status: AC | PRN
Start: 2022-04-11 — End: 2022-05-17

## 2022-04-11 NOTE — Progress Notes
HPIGarrett Lacretia Nicks Bradley is a 66 y.o. male for evaluation of erectile dysfunction.First noted about 5 years ago.  Complains of difficulty achieving a full erection. He was living in [place] and evaluated by Urology. He was on Cialis p.r.n..  It became less effective.  He was given instructions in penile self injections.  He states he received medication about a year ago but has never used it.  He does not have a current partner.  He recently moved to [place] due to health issues.  He has history of cardiac amyloidosis, coronary artery disease and type 2 diabetes.  He is on [place].  He has a history of UTI.  He has glucosuria on urinalysis, but no evidence of UTI.Recent PSA Results:No flowsheet data found. Past Medical HistoryPast Medical History: Diagnosis Date ? Arrhythmia  ? Chronic coronary artery disease  ? Diabetes mellitus (HC Code)  ? Encounter for blood transfusion  ? GERD (gastroesophageal reflux disease)  ? Hypercholesteremia  ? Hypertension  ? Obstructive sleep apnea  ? Syncope, psychogenic  Past Surgical HistoryPast Surgical History: Procedure Laterality Date ? CERVICAL DISC ARTHROPLASTY   ? TOOTH EXTRACTION   ? VALVE REPLACEMENT   AllergiesAllergies Allergen Reactions ? Penicillin G Swelling ? Atorvastatin  MedicationsOutpatient Encounter Medications as of 04/11/2022 Medication Sig Dispense Refill ? acetaminophen (TYLENOL) 325 mg tablet Take 2 tablets (650 mg total) by mouth.   ? allopurinoL (ZYLOPRIM) 300 mg tablet Take 1 tablet (300 mg total) by mouth daily.   ? colchicine (COLCRYS) 0.6 mg tablet Take 1 tablet (0.6 mg total) by mouth 2 (two) times daily as needed.   ? JARDIANCE 10 mg tablet Take 1 tablet (10 mg total) by mouth daily before breakfast.   ? lansoprazole (PREVACID) 15 mg capsule Take 1 capsule (15 mg total) by mouth daily as needed.   ? magnesium oxide (MAG-OX) 400 mg (241.3 mg magnesium) tablet Take 1 tablet (400 mg total) by mouth daily.   ? metFORMIN (GLUCOPHAGE) 500 MG Immediate Release tablet Take 1 tablet (500 mg total) by mouth 2 (two) times daily with breakfast and dinner.   ? multivitamin capsule Take 1 capsule by mouth daily.   ? rivaroxaban (XARELTO) 20 mg Tab tablet 1 tablet (20 mg total) Daily @1700 .   ? rosuvastatin (CRESTOR) 5 MG tablet Take 1 tablet (5 mg total) by mouth nightly.   ? spironolactone (ALDACTONE) 25 mg tablet Take 0.5 tablets (12.5 mg total) by mouth daily.   ? tafamidis (VYNDAMAX) 61 mg capsule Take 1 capsule (61 mg total) by mouth daily.   ? torsemide (DEMADEX) 20 mg tablet Take 2 tablets (40 mg total) by mouth daily as needed. Take torsemide only if weight gain > 3 pounds from your baseline. If at your baseline weight, do not take the torsemide. 60 tablet 4 ? vutrisiran (AMVUTTRA) 25 mg/0.5 mL Syrg syringe Inject 25 mg under the skin every 3 (three) months.   ? ammonium lactate (AMLACTIN) 12 % cream    ? doxycycline hyclate (VIBRA-TABS) 100 mg tablet    ? loratadine (CLARITIN) 10 mg tablet Take 1 tablet (10 mg total) by mouth.   ? midodrine (PROAMATINE) 10 mg tablet    ? nystatin (MYCOSTATIN) 100,000 unit/gram ointment APPLY DAILY TO SKIN TO AFFECTED AREA TWICE A DAY FOR 30 DAYS   ? ONETOUCH VERIO test strips    ? tadalafiL (CIALIS) 20 mg tablet    ? [DISCONTINUED] telmisartan (MICARDIS) 40 mg tablet Take 40 mg by mouth daily.  No facility-administered encounter medications on file as of 04/11/2022.  Social HistorySocial History Tobacco Use ? Smoking status: Former   Types: Cigarettes ? Smokeless tobacco: Never ? Tobacco comments:   Quit early 1980's Substance Use Topics ? Alcohol use: Yes ? Drug use: No  Family HistoryFamily History Problem Relation Age of Onset ? Heart disease Mother  ? Diabetes Mother  ? Stroke Mother  ? Heart failure Mother  ? Heart disease Father  ? Heart attack Father  ? Diabetes Sister  ? Diabetes Brother  Review of SystemsReviewed Physical ExamHt 6' 2 (1.88 m)  - Wt 72.6 kg  - BMI 20.54 kg/m? Constitutional:  Well developed, well nourished, no acute distress.   Respiratory: Normal respiratory effort. Musculoskeletal: Gait appears normal.  Neurological: Patient appears to be well oriented to time, place, and person.   Psych: Mood/affect is appropriate.GENITOURINARYPenis:  Uncircumcised.Urethral Meatus: The meatus is normal in size and location.   Scrotum:  No erythema or edema of scrotal skin.   No evidence of scrotal cysts or rash.  No evidence of infection.  Testicles: Both testicles are normal to palpation.  No masses or tenderness noted.  Size normal.     Lab Results Component Value Date  UCOLOR yellow 04/11/2022  UGLUCOSE 2+ 04/11/2022  UKETONE Negative 04/11/2022  USPECGRAVITY 1.020 04/11/2022  UPROTEIN Negative 04/11/2022  UNITRATES Negative 04/11/2022  UBLOOD Negative 04/11/2022  ULEUKOCYTES Negative 04/11/2022   No results found for: PVRPOCTAssessment:Encounter Diagnoses Name SNOMED [place](R) Primary? ? ED (erectile dysfunction) of organic origin SECONDARY ERECTILE DYSFUNCTION Yes ? Disease of urinary tract DISORDER OF URINARY TRACT  Plan:Erectile dysfunction.  Discussed management options.  He would like to try another oral medication.  Prescription for Viagra 100 milligrams p.r.n. sent to his pharmacy.  Instructed in proper usage.  We also discussed penile injection therapy.  If oral medications are ineffective, he would like to try injection therapy.  He will need repeat instructions on injection therapy if he wishes to proceed.Orders Placed This Encounter Procedures ? POC urinalysis manual w/o scope No follow-ups on file.Patient will let us know if any new problems or symptoms arise in the interim.Frank Bradley, MDYale [phone]

## 2022-04-12 DIAGNOSIS — N399 Disorder of urinary system, unspecified: Secondary | ICD-10-CM

## 2022-04-14 ENCOUNTER — Telehealth: Admit: 2022-04-14 | Payer: PRIVATE HEALTH INSURANCE | Attending: Cardiovascular Disease | Primary: Internal Medicine

## 2022-04-14 ENCOUNTER — Encounter: Admit: 2022-04-14 | Payer: PRIVATE HEALTH INSURANCE | Primary: Internal Medicine

## 2022-04-14 ENCOUNTER — Telehealth: Admit: 2022-04-14 | Payer: PRIVATE HEALTH INSURANCE | Attending: Urology | Primary: Internal Medicine

## 2022-04-14 NOTE — Telephone Encounter
Pt was seen by Dr. Hetty Blend on 04/21/22 he states that he   Is still itching  in his private parts and would like to have a call back to get medication. Please call to advise

## 2022-04-14 NOTE — Telephone Encounter
FYIEnrique Sack @[facility]  assistance  reports they have received the completed enrollment form patient is aware .Marland KitchenMarland KitchenMarland Kitchen

## 2022-04-14 NOTE — Telephone Encounter
Contacted Randall Hiss and he said that he saw Dr Roderic Ovens on 6/19 and he let him know he had some itching around his penis at the tip.  He said that the itchiness is getting worse and wanted to know what to do.  Routing to Dr Hetty Blend

## 2022-04-15 NOTE — Telephone Encounter
Called patient with no answer. Left message on VM that our office will call again - to relay Dr.Buckley's message below.

## 2022-04-15 NOTE — Telephone Encounter
He can use a small amount of OTC 1% hydrocortisone cream twice daily for 2 weeks.

## 2022-04-18 ENCOUNTER — Other Ambulatory Visit (HOSPITAL_COMMUNITY): Payer: Self-pay

## 2022-04-18 ENCOUNTER — Encounter: Admit: 2022-04-18 | Payer: PRIVATE HEALTH INSURANCE | Primary: Internal Medicine

## 2022-04-18 NOTE — Telephone Encounter
Randall Hiss contacted. Relayed recommendation for itching per Dr. Konrad Felix note. Also sent via mychart per patient preference. Pt verbalized understanding. Pattricia Boss., MDPhysicianSigned06/23/2023  Addend     He can use a small amount of OTC 1% hydrocortisone cream twice daily for 2 weeks.

## 2022-04-19 ENCOUNTER — Telehealth: Admit: 2022-04-19 | Payer: PRIVATE HEALTH INSURANCE | Attending: Cardiovascular Disease | Primary: Internal Medicine

## 2022-04-19 NOTE — Telephone Encounter
Good afternoon,Patient would like to go over Stress Test results.Confirmed contact number [phone] you

## 2022-04-20 ENCOUNTER — Telehealth: Admit: 2022-04-20 | Payer: PRIVATE HEALTH INSURANCE | Attending: Cardiovascular Disease | Primary: Internal Medicine

## 2022-04-20 NOTE — Telephone Encounter
Pt called he reports that he now has the Amvuttra medication wants to know who will administer this to him

## 2022-04-20 NOTE — Telephone Encounter
Called pt and he confirmed he received the Amvuttra med at home. He is requesting guidance on what to do with med. Last received on 02/10/2022 and next due on 05/12/2022. Informed pt that pharmacist aware. We will call him again tomorrow for further instructions. Pt verbalized understanding.

## 2022-04-20 NOTE — Other
Discussed w/ ptWill meet w/ him in a couple weeks and start OHT w/u

## 2022-04-21 ENCOUNTER — Other Ambulatory Visit (HOSPITAL_COMMUNITY): Payer: Self-pay

## 2022-04-29 ENCOUNTER — Telehealth: Admit: 2022-04-29 | Payer: PRIVATE HEALTH INSURANCE | Attending: Cardiovascular Disease | Primary: Internal Medicine

## 2022-04-29 NOTE — Telephone Encounter
[email or web address]  called to check status of PA for med... she says pt is concern as his next dosage is on 7/20 please advise of status [phone] ext [phone]

## 2022-04-29 NOTE — Telephone Encounter
Called patient as Frank Bradley prior authorization need to be requested to [employer]. Verified with pt insurance information to help complete PA form: [employer] Member ID# [id number]; Group # [id number]; Insured: pt. Pt needed some time to look for his Medicare card to obtain his ID#. He confirmed he has Medicare with ID# [id number] . Pt has no other coverage. He is unable to state whether it was Chad he received while in [place]. All he remembers is that he was receiving a shot from his arm. He stated he will not be available on 05/12/2022 which is the next due date for Amvuttra (as last dose was 02/10/2022). Pt is going for a family reunion on 05/12/2022 to 05/15/2022. He will be back later during the day of 05/15/2022. So pt is requesting if he can be scheduled on 05/16/2022. Attempted to return call to [facility]. Left a voicemail that our office is aware of need for prior authorization to [facility]. Office number provided if with further questions.

## 2022-05-02 ENCOUNTER — Telehealth: Admit: 2022-05-02 | Payer: PRIVATE HEALTH INSURANCE | Attending: Cardiovascular Disease | Primary: Internal Medicine

## 2022-05-02 NOTE — Telephone Encounter
Called patient. He verbalized that in January 2023, he started the [place]. In April 2023, he started Amvuttra. He had a lot of questions that were not answered by his cardiologist in [place]. He cannot recall if he had any symptoms when he first received Amvuttra. He does not know if he had pre-medications when receiving med. He is unable to realistically state if there is a difference or improvement of symptoms while taking these meds. Informed pt that I will fill in the paperwork for Amvuttra prior authorization and send to Dr. Hyacinth Meeker to sign. With regards to questions about disease process and med information, recommended to read the packet and list down all questions that he wants to discuss with Dr. Hyacinth Meeker. Pt verbalized understanding. [facility] pharmacy in-patient and [place] but unable to state the fax number. None in [employer] found.

## 2022-05-02 NOTE — Telephone Encounter
Returned call to [facility] and confirmed pt will be applying prior authorization to [employer].

## 2022-05-02 NOTE — Telephone Encounter
Bindi P from [facility] requesting a call back regarding Amvutra and prior auth for this med under Part D plan for Medicare.

## 2022-05-03 ENCOUNTER — Telehealth: Admit: 2022-05-03 | Payer: PRIVATE HEALTH INSURANCE | Attending: Cardiovascular Disease | Primary: Internal Medicine

## 2022-05-03 NOTE — Telephone Encounter
Faxed Amvuttra Injectable Medication Precertification Request to [facility] signed by Dr. Hyacinth Meeker at [phone] with receipt confirmation.

## 2022-05-12 ENCOUNTER — Telehealth: Admit: 2022-05-12 | Payer: PRIVATE HEALTH INSURANCE | Attending: Cardiovascular Disease | Primary: Internal Medicine

## 2022-05-12 ENCOUNTER — Encounter: Admit: 2022-05-12 | Payer: PRIVATE HEALTH INSURANCE | Attending: Neuromuscular Medicine | Primary: Internal Medicine

## 2022-05-12 NOTE — Telephone Encounter
Good morning,Rep Kendra from [facility] called to follow up with Frank Bradley regarding prior-auth.Confirmed contact number [phone] ext [phone] you

## 2022-05-12 NOTE — Telephone Encounter
Returned call to [place] at [phone] ext [phone]. Call went to VM. Detailed VMML with my direct number for assistance.

## 2022-05-17 ENCOUNTER — Ambulatory Visit: Admit: 2022-05-17 | Payer: PRIVATE HEALTH INSURANCE | Attending: Cardiovascular Disease | Primary: Internal Medicine

## 2022-05-17 ENCOUNTER — Encounter: Admit: 2022-05-17 | Payer: PRIVATE HEALTH INSURANCE | Attending: Cardiovascular Disease | Primary: Internal Medicine

## 2022-05-17 ENCOUNTER — Telehealth: Admit: 2022-05-17 | Payer: PRIVATE HEALTH INSURANCE | Attending: Advanced Practice Nursing | Primary: Internal Medicine

## 2022-05-17 DIAGNOSIS — F488 Other specified nonpsychotic mental disorders: Secondary | ICD-10-CM

## 2022-05-17 DIAGNOSIS — K219 Gastro-esophageal reflux disease without esophagitis: Secondary | ICD-10-CM

## 2022-05-17 DIAGNOSIS — I43 Cardiomyopathy in diseases classified elsewhere: Secondary | ICD-10-CM

## 2022-05-17 DIAGNOSIS — I10 Essential (primary) hypertension: Secondary | ICD-10-CM

## 2022-05-17 DIAGNOSIS — E7800 Pure hypercholesterolemia, unspecified: Secondary | ICD-10-CM

## 2022-05-17 DIAGNOSIS — E119 Type 2 diabetes mellitus without complications: Secondary | ICD-10-CM

## 2022-05-17 DIAGNOSIS — I499 Cardiac arrhythmia, unspecified: Secondary | ICD-10-CM

## 2022-05-17 DIAGNOSIS — Z5189 Encounter for other specified aftercare: Secondary | ICD-10-CM

## 2022-05-17 DIAGNOSIS — I2510 Atherosclerotic heart disease of native coronary artery without angina pectoris: Secondary | ICD-10-CM

## 2022-05-17 DIAGNOSIS — G4733 Obstructive sleep apnea (adult) (pediatric): Secondary | ICD-10-CM

## 2022-05-17 NOTE — Patient Instructions
Get some blood tests today.  Follow up with a neurologist here in the next couple weeks.  I will schedule you to see me back in 3 months and hopefully he will have figured out whether or not you are going to stay here [place] or go back to [place].  Otherwise, continue current medications.

## 2022-05-17 NOTE — Telephone Encounter
Returned call to Kendra. Advised Candise BowD[employer] Energy R will back tomorrow and that patient had appt with physician today. Kendra Enrique Sackbalized understanding and will wait to until [place] returns.

## 2022-05-17 NOTE — Telephone Encounter
Frank Bradley, from [facility], is calling for Frank Bradley to see if the prior auth for Frank Bradley has been approved.

## 2022-05-18 ENCOUNTER — Other Ambulatory Visit (HOSPITAL_COMMUNITY): Payer: Self-pay | Admitting: Internal Medicine

## 2022-05-18 ENCOUNTER — Telehealth: Admit: 2022-05-18 | Payer: PRIVATE HEALTH INSURANCE | Attending: Cardiovascular Disease | Primary: Internal Medicine

## 2022-05-18 DIAGNOSIS — E854 Organ-limited amyloidosis: Secondary | ICD-10-CM

## 2022-05-18 NOTE — Telephone Encounter
Attempted to call Monia Pouch based on the phone number in [employer] The rep stated that they did not received the faxed sent on 05/03/2022 and later explained they are [facility]. Rep gave me [employer]'s phone number to which I dialed and ended up in Medicare Part B. After prompts placed, it went to busy line x3. Attempted to call Enrique Sack from [employer]. Left a voicemail. Faxed Amvuttra prior auth to Medicare Part B with receipt confirmation.[employer] for Medicare Part B and spoke to rep: Shaun. Pt approved for Amvuttra injection on 05/03/2022 -10/24/2022 with confirmation: [id number]. Pt can be scheduled for injection in [place].

## 2022-05-18 NOTE — Telephone Encounter
Frank Bradley returning Frank Bradley's call. Telephone number to call to start PA is [phone]'s call back number with any further questions is [phone].

## 2022-05-18 NOTE — Progress Notes
[facility] of MedicineSection of Cardiovascular MedicineYale Cardiomyopathy ProgramReferring Provider:Referral MDDiagnosis:Primary cardiologist:PCP:Chief Complaint:History of Present Illness: Frank Bradley is a 66 y.o. 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  He gets moderate exertional shortness of breath with multiple blocks.  No edema.  He has lost some weight.  Some constipation but no diarrhea or nausea.  He did have a cardiopulmonary test which showed a VO2 max of approximately 9 but he was unable to reach anaerobic threshold.  He has a neurology  evaluation coming in the next couple weeksPMH:He was hospitalized May 2023 after moving here from [place].  He was previously seen at the [facility].  He presented with exertional dyspnea.  He was diuresed and became hypotensive.  He required transient ICU evaluation.  He presents today for his 1st clinic visit.  He is taking his medications and feels at his baseline but he does have significant exertional dyspnea.?He is previously received the majority of his cardiac care in [place].  His most recent echo had been performed in February 2023.  At that time his EF was 45-50%.  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  He states he is also on a 2nd amyloid medication, but is unsure of the name.  He is previously been noted to have orthostatic hypotension and was on midodrine 5 mg t.i.d. since December.  He states he also had EMG testing which was abnormal.?He recently moved to [place] and was about to establish care at [facility] in infiltrative cardiomyopathy Clinic, but developed worsening shortness of breath on exertion even with walking to the bathroomAmyloid Specific Clinical History:	N/A Ne CTSRuptured Biceps Tendon: NoSpinal Stenosis: YesNeuropathy CA: Autonomic and PeripheralFamily history of amyloidosis: NoFamily history of unexplained neuropathy: YesOrthostatic hypotension: YesBiopsy proven Cardiac Amyloidosis: NoECG suggestive of CA: YesEcho suggestive of CA: YesMRI suggestive of CA: YesFamily history of CA: NoHeart failure CA: HFrEFAortic stenosis: Surgery Atrial fibrillation/ atrial flutterHypertension: YesHypotension: YesCKD: YesESRD: NoH/o MI: NoACEi intolerance: YesBB intolerance: YesPast Medical History:- Past Medical History: Diagnosis Date ? Arrhythmia  ? Chronic coronary artery disease  ? Diabetes mellitus (HC Code)  ? Encounter for blood transfusion  ? GERD (gastroesophageal reflux disease)  ? Hypercholesteremia  ? Hypertension  ? Obstructive sleep apnea  ? Syncope, psychogenic  Past Surgical History:- Past Surgical History: Procedure Laterality Date ? CERVICAL DISC ARTHROPLASTY   ? TOOTH EXTRACTION   ? VALVE REPLACEMENT   Family History:- Family History Problem Relation Age of Onset ? Heart disease Mother  ? Diabetes Mother  ? Stroke Mother  ? Heart failure Mother  ? Heart disease Father  ? Heart attack Father  ? Diabetes Sister  ? Diabetes Brother  Social History:- Social History Tobacco Use ? Smoking status: Former   Current packs/day: 0.00   Types: Cigarettes ? Smokeless tobacco: Never ? Tobacco comments:   Quit early 1980's Substance Use Topics ? Alcohol use: Yes ? Drug  use: No  Race: BlackEthnicity: BlackFinancial insecurity:NoFood insecurity: NoTransportation barriers: NoHome medications: Patient's Medications New Prescriptions  No medications on file Previous Medications  ACETAMINOPHEN (TYLENOL) 325 MG TABLET    Take 2 tablets (650 mg total) by mouth.  ALLOPURINOL (ZYLOPRIM) 300 MG TABLET    Take 1 tablet (300 mg total) by mouth daily.  AMMONIUM LACTATE (AMLACTIN) 12 % CREAM      COLCHICINE (COLCRYS) 0.6 MG TABLET    Take 1 tablet (0.6 mg total) by mouth 2 (two) times daily as needed.  DOXYCYCLINE HYCLATE (VIBRA-TABS) 100 MG TABLET      JARDIANCE 10 MG TABLET    Take 1 tablet (10 mg total) by mouth daily before breakfast.  LANSOPRAZOLE (PREVACID) 15 MG CAPSULE    Take 1 capsule (15 mg total) by mouth daily as needed.  LORATADINE (CLARITIN) 10 MG TABLET    Take 1 tablet (10 mg total) by mouth.  MAGNESIUM OXIDE (MAG-OX) 400 MG (241.3 MG MAGNESIUM) TABLET    Take 1 tablet (400 mg total) by mouth daily.  METFORMIN (GLUCOPHAGE) 500 MG IMMEDIATE RELEASE TABLET    Take 1 tablet (500 mg total) by mouth 2 (two) times daily with breakfast and dinner.  MIDODRINE (PROAMATINE) 10 MG TABLET      MULTIVITAMIN CAPSULE    Take 1 capsule by mouth daily.  NYSTATIN (MYCOSTATIN) 100,000 UNIT/GRAM OINTMENT    APPLY DAILY TO SKIN TO AFFECTED AREA TWICE A DAY FOR 30 DAYS  ONETOUCH VERIO TEST STRIPS      RIVAROXABAN (XARELTO) 20 MG TAB TABLET    1 tablet (20 mg total) Daily @1700 .  ROSUVASTATIN (CRESTOR) 5 MG TABLET    Take 1 tablet (5 mg total) by mouth nightly.  SILDENAFIL (VIAGRA) 100 MG TABLET    Take 1 tablet (100 mg total) by mouth as needed for erectile dysfunction.  SPIRONOLACTONE (ALDACTONE) 25 MG TABLET    Take 0.5 tablets (12.5 mg total) by mouth daily.  TADALAFIL (CIALIS) 20 MG TABLET      TAFAMIDIS (VYNDAMAX) 61 MG CAPSULE    Take 1 capsule (61 mg total) by mouth daily.  TORSEMIDE (DEMADEX) 20 MG TABLET    Take 2 tablets (40 mg total) by mouth daily as needed. Take torsemide only if weight gain > 3 pounds from your baseline. If at your baseline weight, do not take the torsemide.  VUTRISIRAN (AMVUTTRA) 25 MG/0.5 ML SYRG SYRINGE    Inject 25 mg under the skin every 3 (three) months. Modified Medications  No medications on file Discontinued Medications  No medications on file Review of Systems: ROSPhysical Exam: Vital Signs : No data recorded There were no vitals filed for this visit. Physical ExamConstitutional:     General: He is not in acute distress.   Appearance: He is ill-appearing. HENT:    Head: Normocephalic.    Nose: Nose normal.    Mouth/Throat:    Mouth: Mucous membranes are moist. Eyes:    Pupils: Pupils are equal, round, and reactive to light. Cardiovascular:    Rate and Rhythm: Normal rate.    Heart sounds: Normal heart sounds. Pulmonary:    Effort: Pulmonary effort is normal. Abdominal:    General: Abdomen is flat. Musculoskeletal:       General: No swelling. Psychiatric:       Mood and Affect: Mood normal.       Thought Content: Thought content normal. Labs: Labs:@LABRCNTIP (wbc:3,hgb,plt)@ Kappa Free Light Chains, Serum (mg/dL) Date Value 16/07/9603 6.83 (H) Lambda Free Light Chains,  Serum (mg/dL) Date Value 16/07/9603 3.55 (H) Kappa/Lambda Free Light Chains Ratio, Serum (no units) Date Value 03/08/2022 1.92 (H)  @LABRCNTIP (na:3,k,cl,co2,BUN,creatinine,glu,aniongap)@ @LABRCNTIP (calcium:3,mg,phos,PREALBUMIN)@ No results found for: TROPONINTLab Results Component Value Date  BNPPRO 2,249.0 (H) 03/08/2022  BNPPRO 1,297.0 (H) 02/25/2022  BNPPRO 1,486.0 (H) 02/24/2022  Lab Results Component Value Date  LABPROT 14.4 (H) 10/08/2016  INR 1.48 (H) 10/08/2016  Imaging: Echo :Results for orders placed or performed during the hospital encounter of 02/22/22 Echo 2D Ltd w Doppler and CFI if Ind Image Enhancement and or 3D Result Value Ref Range  Reported Biplane EF% 38 %  Narrative  ~ * Limited Echocardiogram* Moderately decreased left ventricular systolic function. The left ventricular ejection fraction calculated by biplane Simpson's was 38%.  No thrombus visualized in the left ventricle. Moderately increased left ventricular cavity size.  Severe concentric left ventricular hypertrophy.  Flattened septum in systole consistent with right ventricular pressure overload.* Moderately increased right ventricular cavity size.  Mildly decreased right ventricular systolic function.  Estimated right ventricular systolic pressure is 44 mmHg compatible with pulmonary hypertension. The right ventricular systolic pressure may be underestimated.* A bioprosthetic aortic valve is present.  Spectral doppler was not performed across the aortic valve on this study.* Mild mitral regurgitation.* Severe tricuspid regurgitation.* Hepatic vein flow shows systolic flow reversal.  IVC diameter > 2.1 cm that collapses < 50% with a sniff suggests high RAP (10-20 mmHg, mean 15 mmHg).* No significant pericardial effusion.* No prior transthoracic study available for comparison. PYP :No results found for this or any previous visit.MRI :EKG: Assessment and Plan: CYRON HOAG is a 66 y.o. 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  He has NYHA class 3 CHF symptoms.  He has polyneuropathy symptoms with possible autonomic dysfunction and peripheral neuropathy.  He is currently taking TTR stabilizers and TTR suppressing agents.  He was recently hospitalized for acute on chronic systolic and diastolic heart failure with diuresis.  Today he looks stable.  His exertional dyspnea is stable and NYHA class 3.  We did a cardiopulmonary test which showed a VO2 max of 9 but he is unable to reach anaerobic threshold.  This suggests deconditioning.  He has a neurology evaluation in the next couple weeks.  We discussed possible referral for heart transplant evaluation, but he is reluctant to move forward at this time as he is currently deciding whether he is going to remain in [place] or moved back to [place].  We are still working on the [employer] pre authorization and hopefully that will come through in the next couple days.  Organ involvement: Organ involvement: Cardiac, Neuropathy - Autonomic and Neuropathy - PeripheralNYHA class: 3Genetic testing: Genetic Testing CA: [id number] am going to see him back in about 3 Kandyce Rud, MD PhDAssociate Professor of Medicine & Radiology and Biomedical ImagingCardiology AttendingCardiomyopathy Clinic:  [phone].[email or web address]

## 2022-05-19 ENCOUNTER — Telehealth: Admit: 2022-05-19 | Payer: PRIVATE HEALTH INSURANCE | Attending: Cardiovascular Disease | Primary: Internal Medicine

## 2022-05-19 ENCOUNTER — Encounter: Admit: 2022-05-19 | Payer: PRIVATE HEALTH INSURANCE | Attending: Pharmacist | Primary: Internal Medicine

## 2022-05-19 ENCOUNTER — Ambulatory Visit: Admit: 2022-05-19 | Payer: PRIVATE HEALTH INSURANCE | Primary: Internal Medicine

## 2022-05-19 DIAGNOSIS — E854 Organ-limited amyloidosis: Secondary | ICD-10-CM

## 2022-05-19 DIAGNOSIS — I43 Cardiomyopathy in diseases classified elsewhere: Secondary | ICD-10-CM

## 2022-05-19 MED ORDER — EPINEPHRINE 0.3 MG/0.3 ML INJECTION, AUTO-INJECTOR
0.30.3 mg/ mL | INTRAMUSCULAR | Status: DC | PRN
Start: 2022-05-19 — End: 2022-05-19

## 2022-05-19 MED ORDER — DIPHENHYDRAMINE 25 MG CAPSULE
25 mg | Freq: Once | ORAL | Status: DC | PRN
Start: 2022-05-19 — End: 2022-05-19

## 2022-05-19 MED ORDER — ALBUTEROL SULFATE 2.5 MG/3 ML (0.083 %) SOLUTION FOR NEBULIZATION
2.530.083 mg /3 mL (0.083 %) | RESPIRATORY_TRACT | Status: DC | PRN
Start: 2022-05-19 — End: 2022-05-19

## 2022-05-19 MED ORDER — SODIUM CHLORIDE 0.9 % BOLUS (NEW BAG)
0.9 % | Freq: Once | INTRAVENOUS | Status: DC | PRN
Start: 2022-05-19 — End: 2022-05-19

## 2022-05-19 MED ORDER — DIPHENHYDRAMINE 50 MG/ML INJECTION (WRAPPED E-RX)
50 mg/mL | Freq: Once | INTRAMUSCULAR | Status: DC | PRN
Start: 2022-05-19 — End: 2022-05-19

## 2022-05-19 MED ORDER — HYDROCORTISONE SODIUM SUCCINATE 100 MG SOLUTION FOR INJECTION
100 mg | Freq: Once | INTRAMUSCULAR | Status: DC | PRN
Start: 2022-05-19 — End: 2022-05-19

## 2022-05-19 MED ORDER — VUTRISIRAN 25 MG/0.5 ML SUBCUTANEOUS SYRINGE
25 mg/0.5 mL | Freq: Once | SUBCUTANEOUS | Status: CP
Start: 2022-05-19 — End: ?
  Administered 2022-05-19: 17:00:00 25 mL via SUBCUTANEOUS

## 2022-05-19 NOTE — Telephone Encounter
Faxed approval letter for Amvuttra to [facility] with receipt confirmation.

## 2022-05-19 NOTE — Telephone Encounter
Called Kendra from [employer]. She stated that Sushma from pharmacy also called her and sending the approval to [facility]. I asked Enrique Sack the process for future prior auth with Amvuttra:1.) [employer] will inform our office 14 days when due to renew.2.) Insurance typically send approval. In cases when none is sent, follow up will be needed.3.) [employer] will need the approval documentation sent to them so meds can be dispensed by pharmacy.4.) Pt can be scheduled for injection.Also discussed pt's concerns of being left in the dark in terms of benefits and expectations while on Amvuttra. Enrique Sack stated that pt will have a meeting with the [employer]'s nurse for education together with pt's family member. Enrique Sack will inform our office for update.

## 2022-05-19 NOTE — Telephone Encounter
Kendra from [employer] calling to speak with Candise Bowens. Patient's PA needs to go through his medical benefits and not his pharmacy benefits. Unfortunately, PA on file was obtained through pharmacy benefits which is incorrect since the medication will be administered in the office as opposed to at home. Please call Enrique Sack at [phone] and she can do a conference call with Candise Bowens to get the proper prior auth.

## 2022-05-19 NOTE — Telephone Encounter
Called Kendra from [employer] and conference call with [employer]. Enrique Sack stated that pharmacy sent an approval letter that is indicating that Amvuttra administered at home. Revonda Standard from [place] stated that when I called yesterday, the approval confirmation number [id number] clearly stated Amvuttra to be given in [facility] infusion center/ clinic. Also verified with Revonda Standard that the pre-cert request form stated med administered in clinic. Informed Revonda Standard that our office never received an approval letter on first faxed request on 05/03/2022. The faxed information of approval sent yesterday was in response to faxing the second time to insurance, therefore this can be disregarded (as it was already approved). Revonda Standard will fax the original approval letter stating Amvuttra to be given in clinic and I will fax it to [place] from [employer].

## 2022-05-19 NOTE — Progress Notes
Pt arrived this am to [facility] for amvuttra injection.  Pt assessed by Daphine Deutscher APRN.  Plan per APP.  Med per [facility].  VS done.  Amvuttra injection administered to left upper arm.  Pt monitored for 15 minutes with no s/s of adverse reaction.  Pt discharged to home from [facility].Electronically Signed by [name], RN, May 19, 2022

## 2022-05-19 NOTE — Progress Notes
HEART FAILURE OFFICE VISIT NOTEPatient ID: Frank Bradley is a 66 y.o. male with PMHx HFrEF (EF=38%), CAD s/p CABG, AVR, TTR cardiac amyloidosis, AF s/p ablation on xarelto, OSA on CPAP, DMII, CKDII.He was recently admitted 02/22/22-02/26/22 with progressive?dyspnea on exertion and volume overload. He was diuresed successfully. However, the combination of overdiuresis and vasodilation 2/2 addition of losartan 100 mg led to significant symptomatic hypotension down to 60/40 and MICU admission. He quickly improved with better volume balance and initiation of levophed and then midodrine 5 mg TID. He was also found to have a UTI with GBS for which he was treated with 5 days of Keflex. He was seen by Dr. Hyacinth Meeker on 05/17/22. He was stable on exam and no changes were made. He was planned for heart transplant work-up but it was deferred to possibility of moving back out of state. Frank Bradley received insurance approval for [employer] injection.Frank Bradley presents to clinic for amvuttra. VISIT DIAGNOSIS:1. Cardiac amyloidosis (HC Code) (HC CODE) (HC Code)  Nursing communication  Nasal Oxygen- PRN (Adult)  vutrisiran (AMVUTTRA) 25 mg/0.5 mL syringe 25 mg  diphenhydrAMINE (BENADRYL) capsule 50 mg  diphenhydrAMINE (BENADRYL) injection 50 mg  hydrocortisone sodium succinate (Solu-CORTEF) injection 100 mg  albuterol neb sol 2.5 mg/3 mL (0.083%) (PROVENTIL,VENTOLIN)  EPINEPHrine (EPI-PEN) auto-injector 0.3 mg  EPINEPHrine (EPI-PEN) auto-injector 0.3 mg  sodium chloride 0.9 % (new bag) bolus 500 mL   Reason for Visit: Chief Complaint Patient presents with ? Infusion    Past Medical History: has a past medical history of Arrhythmia, Chronic coronary artery disease, Diabetes mellitus (HC Code), Encounter for blood transfusion, GERD (gastroesophageal reflux disease), Hypercholesteremia, Hypertension, Obstructive sleep apnea, and Syncope, psychogenic.INTERVAL HISTORY:Patient presents to clinic unaccompanied.  Patient reports adherence with medications as listed below.He denies all heart failure symptoms. Review of Systems:Review of Systems Constitutional: Negative for malaise/fatigue. Respiratory: Negative for cough, sputum production and shortness of breath.  Cardiovascular: Negative for chest pain, palpitations, orthopnea, leg swelling and PND. Gastrointestinal: Negative for abdominal pain, constipation, diarrhea, nausea and vomiting. Neurological: Negative for dizziness, weakness and headaches. ALLERGIES:Allergies Allergen Reactions ? Penicillin G Swelling ? Atorvastatin  MEDICATIONS:Current Medications Medication Sig ? allopurinoL (ZYLOPRIM) 300 mg tablet Take 1 tablet (300 mg total) by mouth daily. ? ammonium lactate (AMLACTIN) 12 % cream  ? doxycycline hyclate (VIBRA-TABS) 100 mg tablet  ? JARDIANCE 10 mg tablet Take 1 tablet (10 mg total) by mouth daily before breakfast. ? lansoprazole (PREVACID) 15 mg capsule Take 1 capsule (15 mg total) by mouth daily as needed. ? magnesium oxide (MAG-OX) 400 mg (241.3 mg magnesium) tablet Take 1 tablet (400 mg total) by mouth daily. ? metFORMIN (GLUCOPHAGE) 500 MG Immediate Release tablet Take 1 tablet (500 mg total) by mouth 2 (two) times daily with breakfast and dinner. ? midodrine (PROAMATINE) 10 mg tablet Take 1 tablet (10 mg total) by mouth 3 (three) times daily with meals. ? nystatin (MYCOSTATIN) 100,000 unit/gram ointment APPLY DAILY TO SKIN TO AFFECTED AREA TWICE A DAY FOR 30 DAYS ? ONETOUCH VERIO test strips  ? rivaroxaban (XARELTO) 20 mg Tab tablet 1 tablet (20 mg total) Daily @1700 . ? rosuvastatin (CRESTOR) 5 MG tablet Take 1 tablet (5 mg total) by mouth nightly. ? spironolactone (ALDACTONE) 25 mg tablet Take 0.5 tablets (12.5 mg total) by mouth daily. ? tafamidis (VYNDAMAX) 61 mg capsule Take 1 capsule (61 mg total) by mouth daily. ? torsemide (DEMADEX) 20 mg tablet Take 2 tablets (40 mg total) by mouth daily as needed.  Take torsemide only if weight gain > 3 pounds from your baseline. If at your baseline weight, do not take the torsemide. ? vutrisiran (AMVUTTRA) 25 mg/0.5 mL Syrg syringe Inject 25 mg under the skin every 3 (three) months. PHYSICAL EXAM:Physical ExamConstitutional:     Appearance: Normal appearance. He is normal weight. HENT:    Head: Normocephalic and atraumatic. Neck:    Vascular: No hepatojugular reflux or JVD. Cardiovascular:    Rate and Rhythm: Normal rate. Rhythm irregular.    Heart sounds: Normal heart sounds. No murmur heard.Pulmonary:    Effort: Pulmonary effort is normal. Abdominal:    General: Abdomen is flat. There is no distension.    Palpations: Abdomen is soft.    Tenderness: There is no abdominal tenderness. Musculoskeletal:    Cervical back: Normal range of motion and neck supple.    Right lower leg: No edema.    Left lower leg: No edema. Skin:   General: Skin is warm and dry. Neurological:    General: No focal deficit present.    Mental Status: He is alert and oriented to person, place, and time. Mental status is at baseline. Psychiatric:       Mood and Affect: Mood normal.       Behavior: Behavior normal.       Thought Content: Thought content normal.       Judgment: Judgment normal. VITAL SIGNS:Vitals:  05/19/22 1154 BP: 122/79 Site: Right Arm Position: Sitting Cuff Size: Medium Pulse: 72 Resp: 16 Temp: 98 ?F (36.7 ?C) TempSrc: No-touch scanner SpO2: 97% There is no height or weight on file to calculate BMI.Wt Readings from Last 3 Encounters: 05/17/22 71.7 kg 04/11/22 72.6 kg 03/08/22 77.1 kg Tests Review: INR: Lab Results Component Value Date  INR 1.48 (H) 10/08/2016 BMP: Lab Results Component Value Date  CALCIUM 10.1 03/08/2022  NA 137 03/08/2022  K 4.5 03/08/2022  CO2 27 03/08/2022  CL 100 03/08/2022  BUN 32 (H) 03/08/2022  CREATININE 1.60 (H) 03/08/2022 BNP: Lab Results Component Value Date  BNPPRO 2,249.0 (H) 03/08/2022  BNPPRO 1,297.0 (H) 02/25/2022  BNPPRO 1,486.0 (H) 02/24/2022 EKG: 07/25/23Sinus rhythm:Ventricular premature complex:Prolonged PR interval:Right bundle branch block.Ischemic Workup: 03/29/22, stress test* 1) The peak VO2 was 9.8 mL/kg/min (753 mL/min; 2.8 METS) which was 38% of  maximum predicted.  Anaerobic threshold was not obtained.  Baseline pulmonary function testing showed moderate restriction with abnormal FEV1 of 2.65 (77% predicted), abnormal FVC of 2.9 (64% predicted) and abnormal FEV1/FVC of 91 (120% predicted).  Overall, the findings of this test were indicative of no pulmonary limitation to exercise. Cannot evaluate for circulatory limitation due to not achieving their anaerobic threshold, Wave forms suggests Yahoo respiratory pattern.~ * 2) Nondiagnostic stress treadmill ECG test due to an abnormal baseline ECG.  The patient exercised at a poor workload for age and gender for a total of 7 minutes and 52 seconds using the FIXCPX study protocol.  Exercise capacity was 3.3 METs.  The maximal heart rate was 96 bpm which represents 63% of the age-predicted maximal heart rate not achieving target heart rate.  Peak stress blood pressure was hypertensive for achieved workload at 152/83 mmHg.~ * 3) No prior study available for comparison.ECHO: Results for orders placed or performed during the hospital encounter of 02/22/22 Echo 2D Ltd w Doppler and CFI if Ind Image Enhancement and or 3D Result Value Ref Range  Reported Biplane EF% 38 %  Narrative  ~ * Limited Echocardiogram* Moderately decreased left ventricular systolic function. The  left ventricular ejection fraction calculated by biplane Simpson's was 38%.  No thrombus visualized in the left ventricle. Moderately increased left ventricular cavity size.  Severe concentric left ventricular hypertrophy.  Flattened septum in systole consistent with right ventricular pressure overload.* Moderately increased right ventricular cavity size.  Mildly decreased right ventricular systolic function.  Estimated right ventricular systolic pressure is 44 mmHg compatible with pulmonary hypertension. The right ventricular systolic pressure may be underestimated.* A bioprosthetic aortic valve is present.  Spectral doppler was not performed across the aortic valve on this study.* Mild mitral regurgitation.* Severe tricuspid regurgitation.* Hepatic vein flow shows systolic flow reversal.  IVC diameter > 2.1 cm that collapses < 50% with a sniff suggests high RAP (10-20 mmHg, mean 15 mmHg).* No significant pericardial effusion.* No prior transthoracic study available for comparison. Other Cardiac Imaging: 09/22/21, cMRIIMPRESSION: 1. Mild LVE with moderate LVH diffuse hypokinesis EF 40% 2. Markedly abnormal post gadolinium images with 41.8% myocardial uptake over all axial slices Although there is a subendocardial and mid myocardial predominance and worse uptake in the basal segments there is also transmural uptake in some areas not contained to a coronary distribution 3. Degenerative appearing Bioprosthetic AVR with mild appearing AR and thickened leaflets 4. ?Mild RVE/RV hypokinesis RVEF 46% Further w/u for infiltrative DCM indicated with? ?PET/[facility] Cardiovascular Monitoring:Assessment: Frank Bradley is a 66 y.o. male with a LVEF of ECHO LVEF Latest Ref Rng & Units 02/23/2022 Biplane % 38 .NYHA :Class II - Can complete >/= 5 METS  .STEVENSON HEMODYNAMIC CLASSIFICATION (based on clinic exam):  warm and dry.6 minute walk:    No data to display    Assessment  EVIDENCE-BASED REVIEW OF THERAPEUTICS:	1.) ACE-I, ARB, or ARNI:  Ace-1, ARB, ARNI   	2.) Approved Beta-Blocker:  Beta Blocker   None  	3.) Aldosterone antagonist:  Aldosterone Antagonist     Start End  spironolactone (ALDACTONE) 25 mg tablet    Class: Historical Med  	4.) Hydralazine-Isordil:  Hydralazine-Isordil Meds   None  	5.) SGLT2 Inhibitor:  SGLT2 Inhibitor     Start End  JARDIANCE 10 mg tablet 04/13/2021   Class: Historical Med                6.) Anticoagulation:  Anticoagulation     Start End  rivaroxaban (XARELTO) 20 mg Tab tablet 03/04/2016   Class: Historical Med    Edison Simon, CPHT 04/25/2021 12:10 PM Christiana Pellant, RN 06/02/2016  3:16 PMReceived [facility]     	7.) Implantable Cardiac Defibrillator:  No, Reason: GDMT	8.) Cardiac Resynchronization Therapy:  No, Reason: GDMT			9.) Remote monitoring:  No              10.) Cardiomems Candidate: No  ADVANCED THERAPEUTIC CONSIDERATIONS: None currentlyDIET AND FLUID INSTRUCTIONS: Recommended 2000 cc total daily fluid restriction and low-salt (2000 mg sodium) diet.Plan: HFrEF, EF 38%, NYHA Class IIIVolume status: euvolemic on examDiuretic Therapy: noneContinue: jardiance 10 mg daily, aldactone 25 mg daily, Plan: No medication changes. Received amvuttra injection without issues. Follow-up in [place] for next injection in 3 months.    Emphasized salt restriction.Encouraged daily monitoring of the patient's weight.Disease process and medications discussed. Questions answered fully.Encouraged regular exercise., Labs None to assess electrolyte status, kidney statusTCC Follow Up: every 3 monthsCardiology Follow Up: 06/15/22, Camie Patience APRN. 08/23/22, Dr. Gracy Bruins Follow Up: needs to establish care   Electronically Signed by Daphine Deutscher, APRN, May 19, 2022

## 2022-05-19 NOTE — Telephone Encounter
Good morning,Kendra from [facility] calling to speak with Candise Bowens, she stated she was on a phone conference with JenContact number [phone] Ext: [phone] you

## 2022-05-19 NOTE — Telephone Encounter
Hi Sushma. Does Dr. Hyacinth Meeker need to sign anything for the Amvuttra order?

## 2022-05-20 DIAGNOSIS — Z888 Allergy status to other drugs, medicaments and biological substances status: Secondary | ICD-10-CM

## 2022-05-20 DIAGNOSIS — I130 Hypertensive heart and chronic kidney disease with heart failure and stage 1 through stage 4 chronic kidney disease, or unspecified chronic kidney disease: Secondary | ICD-10-CM

## 2022-05-20 DIAGNOSIS — Z79899 Other long term (current) drug therapy: Secondary | ICD-10-CM

## 2022-05-20 DIAGNOSIS — G4733 Obstructive sleep apnea (adult) (pediatric): Secondary | ICD-10-CM

## 2022-05-20 DIAGNOSIS — Z953 Presence of xenogenic heart valve: Secondary | ICD-10-CM

## 2022-05-20 DIAGNOSIS — N182 Chronic kidney disease, stage 2 (mild): Secondary | ICD-10-CM

## 2022-05-20 DIAGNOSIS — Z7984 Long term (current) use of oral hypoglycemic drugs: Secondary | ICD-10-CM

## 2022-05-20 DIAGNOSIS — Z951 Presence of aortocoronary bypass graft: Secondary | ICD-10-CM

## 2022-05-20 DIAGNOSIS — Z9989 Dependence on other enabling machines and devices: Secondary | ICD-10-CM

## 2022-05-20 DIAGNOSIS — Z7901 Long term (current) use of anticoagulants: Secondary | ICD-10-CM

## 2022-05-20 DIAGNOSIS — E1122 Type 2 diabetes mellitus with diabetic chronic kidney disease: Secondary | ICD-10-CM

## 2022-05-20 DIAGNOSIS — I4891 Unspecified atrial fibrillation: Secondary | ICD-10-CM

## 2022-05-20 DIAGNOSIS — I2510 Atherosclerotic heart disease of native coronary artery without angina pectoris: Secondary | ICD-10-CM

## 2022-05-20 DIAGNOSIS — E7800 Pure hypercholesterolemia, unspecified: Secondary | ICD-10-CM

## 2022-05-20 DIAGNOSIS — Z880 Allergy status to penicillin: Secondary | ICD-10-CM

## 2022-05-20 DIAGNOSIS — I5020 Unspecified systolic (congestive) heart failure: Secondary | ICD-10-CM

## 2022-05-24 ENCOUNTER — Encounter: Admit: 2022-05-24 | Payer: PRIVATE HEALTH INSURANCE | Attending: Cardiovascular Disease | Primary: Internal Medicine

## 2022-05-26 ENCOUNTER — Ambulatory Visit: Admit: 2022-05-26 | Payer: PRIVATE HEALTH INSURANCE | Attending: Neuromuscular Medicine | Primary: Internal Medicine

## 2022-05-26 ENCOUNTER — Encounter: Admit: 2022-05-26 | Payer: PRIVATE HEALTH INSURANCE | Attending: Neuromuscular Medicine | Primary: Internal Medicine

## 2022-05-26 DIAGNOSIS — R633 Feeding difficulty: Secondary | ICD-10-CM

## 2022-05-26 DIAGNOSIS — I499 Cardiac arrhythmia, unspecified: Secondary | ICD-10-CM

## 2022-05-26 DIAGNOSIS — G4733 Obstructive sleep apnea (adult) (pediatric): Secondary | ICD-10-CM

## 2022-05-26 DIAGNOSIS — K219 Gastro-esophageal reflux disease without esophagitis: Secondary | ICD-10-CM

## 2022-05-26 DIAGNOSIS — E851 Neuropathic heredofamilial amyloidosis: Secondary | ICD-10-CM

## 2022-05-26 DIAGNOSIS — I10 Essential (primary) hypertension: Secondary | ICD-10-CM

## 2022-05-26 DIAGNOSIS — E7800 Pure hypercholesterolemia, unspecified: Secondary | ICD-10-CM

## 2022-05-26 DIAGNOSIS — E119 Type 2 diabetes mellitus without complications: Secondary | ICD-10-CM

## 2022-05-26 DIAGNOSIS — Z5189 Encounter for other specified aftercare: Secondary | ICD-10-CM

## 2022-05-26 DIAGNOSIS — Z79899 Other long term (current) drug therapy: Secondary | ICD-10-CM

## 2022-05-26 DIAGNOSIS — E1369 Other specified diabetes mellitus with other specified complication: Secondary | ICD-10-CM

## 2022-05-26 DIAGNOSIS — I2510 Atherosclerotic heart disease of native coronary artery without angina pectoris: Secondary | ICD-10-CM

## 2022-05-26 DIAGNOSIS — R5383 Other fatigue: Secondary | ICD-10-CM

## 2022-05-26 DIAGNOSIS — F488 Other specified nonpsychotic mental disorders: Secondary | ICD-10-CM

## 2022-05-26 NOTE — Progress Notes
Attending Attestation:I have seen and examined the patient on 05/26/2022 for hATTR. I have reviewed the pertinent history, laboratory results, and imaging in the chart. 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 EMG at [facility] (OSH) in 12/2021 showed mild sensorimotor neuropathy with possible demyelination and bilateral CTS. PND stage I. 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 He is already on midodrine for [facility]. Treatment for hATTR includes tafamidis (CM) and vutrisiran (PN, lasttreatment last week). Exam is fairly unremarkable with mild sensory loss distally (vibrations 8/5 at MCP/ankles). I discussed the patient's management with the resident/fellow and agree with their documentation with the following additions/revisions:Will obtain additional lab testing.. My involvement in the patient's care also includes counseling. Continue current regimen. Follow-up in 6 months.Val122Iso (het)PND stage 1FAP RODS [id number] [phone] Decision Making: The following factors are applicable to this patient encounter: Problems; complex multisystemic disease on gene silencer, unexplained weight loss ., Data/History;: extensive records reviewed #6 unique tests ordered and Risk; patient on gene silencers: .Electronically Signed:Kin Galbraith Juliene Pina, MD 05/26/2022 2:14 PM

## 2022-05-26 NOTE — Progress Notes
[facility]: neuropathic TTR amyloidosisHPI: Frank Bradley is a 66 y.o. right handed man with afib, HF and TTR?amyloidosis?who presents to establish care with neurology for neuropathic TTR amyloidosis. He was diagnosed in January 2023 with amyloidosis in [place]. 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 He is currently on Amvuttra for his neuropathy and just got his last injection about 1 week ago.  Current stage of PND is 1.His symptoms first started about 2 years prior to diagnosis which were weight loss and shortness of breath. He then started retaining fluids as well which led to a hospitalization. He continued to have shortness of breath after the diuresis which led to more hospitalizations. He was recommended by his general cardiologist to [facility] to the heart failure clinic and was seen in November 2022 in the HF clinic, this is where the amyloid diagnosis was made. Neurologically he gets numbness in his fingers on the left that has been going on since 2014, shortly after this he had had cervical surgery, and after the surgery he had no improvement and started developing the numbness in the right fingers. In the left hand the symptoms are present in all the fingers and up to the wrist. In the right hand only the pointer and middle finger are affected. Overall has stayed the same since after his surgery in terms of hand symptoms. There is no weaknees in the hands. He intermittently gets numbness/ingling in the feet that started about 5 years ago, this also seems to worsen when he has bouts with gout. There is no weakness in the legs or the feet. No falls and walking is only impaired by SOB. He occasionally gets light headed, he was diagnosed with orthostatic hypotension in Dec 2022 which has significantly improved since starting midodrine. He has issues with constipation. He urinates frequently ever since starting diuretics. He has not had any issues with temperature control and no loss of sweating. He has noticed that his visual acuity with far sightedness has worsened, he is pending cataract surgery. He is still driving and no significant issues driving at night. He had had a CABG and valve replacement in 2011.  He had right-sided Bell's palsy and had a corrective eyelid surgery as a result of this.Family: 7 siblings with 3 living. 4 deceased siblings died: sister-cervical and breath cancer (was having cardiac issues near the end), brother-pancreatic cancer, sister-cervical/breast cancer, brother-HF and diabetes. Of the living siblings one sister has cardiac issues and all 3 of his sisters complained of numbness in legs and hands. He has 2 children, daughter-44 and son-35. His son has diabetes and is planning to get genetic testing. He has 2 grandsons that are 20 & 22 both are healthy. He just recently talked with a pharma rep about possible genetic testing for his family.ROSNo headache, no vision loss, no double vision, no eyelid drooping, no speech changes, no swallow difficulties, no chest pain, no palpitations, no bladder issues, no focal weakness, no falls, no hallucinations, no tremors, no sleep disturbances. No fevers, no chills, no nausea, no vomiting.  Past Medical History: Diagnosis Date ? Arrhythmia  ? Chronic coronary artery disease  ? Diabetes mellitus (HC Code)  ? Encounter for blood transfusion  ? GERD (gastroesophageal reflux disease)  ? Hypercholesteremia  ? Hypertension  ? Obstructive sleep apnea  ? Syncope, psychogenic  Past Surgical History: Procedure Laterality Date ? CERVICAL DISC ARTHROPLASTY   ? TOOTH EXTRACTION   ? VALVE REPLACEMENT   Family History Problem Relation Age of Onset ?  Heart disease Mother  ? Diabetes Mother  ? Stroke Mother  ? Heart failure Mother  ? Heart disease Father  ? Heart attack Father  ? Diabetes Sister  ? Diabetes Brother  Social History Socioeconomic History ? Marital status: Married Tobacco Use ? Smoking status: Former   Current packs/day: 0.00   Types: Cigarettes ? Smokeless tobacco: Never ? Tobacco comments:   Quit early 1980's Vaping Use ? Vaping Use: Never used Substance and Sexual Activity ? Alcohol use: Not Currently ? Drug use: No Current Outpatient Medications Medication Sig ? allopurinoL Take 1 tablet (300 mg total) by mouth daily. ? ammonium lactate  ? doxycycline hyclate  ? Jardiance Take 1 tablet (10 mg total) by mouth daily before breakfast. ? lansoprazole Take 1 capsule (15 mg total) by mouth daily as needed. ? magnesium oxide Take 1 tablet (400 mg total) by mouth daily. ? metFORMIN Take 1 tablet (500 mg total) by mouth 2 (two) times daily with breakfast and dinner. ? midodrine Take 1 tablet (10 mg total) by mouth 3 (three) times daily with meals. ? nystatin APPLY DAILY TO SKIN TO AFFECTED AREA TWICE A DAY FOR 30 DAYS ? OneTouch Verio  ? rivaroxaban 1 tablet (20 mg total) Daily @1700 . ? rosuvastatin Take 1 tablet (5 mg total) by mouth nightly. ? spironolactone Take 0.5 tablets (12.5 mg total) by mouth daily. ? tafamidis Take 1 capsule (61 mg total) by mouth daily. ? torsemide Take 2 tablets (40 mg total) by mouth daily as needed. Take torsemide only if weight gain > 3 pounds from your baseline. If at your baseline weight, do not take the torsemide. ? vutrisiran Inject 25 mg under the skin every 3 (three) months. No current facility-administered medications for this visit. is allergic to penicillin g and atorvastatin.Physical Exam BP 113/75 (Site: r a, Position: Sitting, Cuff Size: Large)  - Pulse 77  - Temp (!) 96.8 ?F (36 ?C)  - Ht 6' 1 (1.854 m)  - Wt 74.8 kg  - SpO2 100%  - BMI 21.77 kg/m? General Exam General: no apparent distress, cooperative, well developed, well nourished.Neurological Exam Mental Status Alert and oriented to person, place, time, and situationCranial Nerves II:  PERRL; visual fields fullIII, IV, VI:  EOMI, nystagmus absent, ptosis absent V:  facial sensation intact to light touchVII:  facial strength asymmetric with weakness in the right lower face and asymmetry in size of his eyes (left eye much wider than right eye); speech - no dysarthria  VIII:  hearing right - NL and left- NL  X:  uvula/palate midline and normal  XI:  sternocleidomastoid: right- 5/5, left- 5/5; trapezius: right- 5/5. left- 5/5  XII:  tongue midline, fasciculations absent Motor Bulk and tone normal.  Tremors absent.  Fasciculations absent.Drift absent bilaterally.Fine finger movements normal.Neck flexion:  5/5, Neck extension: 5/5 Right Left Deltoid 5/5 5/5 Biceps 5/5 5/5 Triceps 5/5 5/5 Grip 5/5 5/5 Thumb Abduction  5/5 5/5 1st finger Abduction  5/5 5/5 5th finger Abduction  5/5 5/5 Finger extension 5/5 5/5 Iliopsoas 5/5 5/5 Quadriceps 5/5 5/5 Hamstrings 5/5 5/5 Dorsiflexion 5/5 5/5 Plantarflexion 5/5 5/5 Foot inversion 5/5 5/5 Foot eversion 5/5 5/5 1st toe extension  5/5 5/5 Sensory Light touch - normal Pinprick - normal  Vibration - subtly decreased at the toes bilaterally.  Proprioception - normal Tinel's sign:  right wrist - absent, left wrist - presentRomberg - normalCerebellarFinger to nose - normal bilaterally.   Heel to shin - normal bilaterally.  Reflexes Right  Left Biceps 2 2 Ticeps 2 2 Brachioradialis 2 2 Patella 2 2 Achilles 2 2 Babinski testing downgoing downgoing Hoffman absent absent Clonus absent absent GaitNormal; tandem normal.Imagining/ProceduresEMG reviewed. AssessmentGarrett Lacretia Nicks Bradley is a 66 y.o.  right handed man with afib, HF and TTR amyloidosis?who presents to establish care with neurology for neuropathic TTR amyloidosis. 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 He is currently on Amvuttra for his neuropathy and just got his last injection about 1 week ago.  Current stage of PND is 1.He is very stable from a peripheral neuropathy perspective in regards to his amyloidosis.  We will not make any changes to his current medication regimen today.  We have discussed with him that vitamin-A tends to be low in people with amyloidosis so he should start supplementation with vitamin a and we will check a level of this today.  We will send some other serum labs as well today for monitoring.  We have offered him information to help receive free genetic testing for his siblings and children, he will reach out to [place] with the contact information of his family members wishing to pursue this further. Plan-serum labs:  Pre-Albumin, vitamin-A level, A1c, TSH with reflex, vitamin-D, vitamin-E-continue Amvuttra q3 month injections-provided information family genetic testing-six-month follow-upThis case was discussed with Dr. Randa Spike, neurology attending. SIGNATURE:Maylani Embree, PGY-5Neuromuscular FellowDepartment of Neurology

## 2022-05-26 NOTE — Patient Instructions
For family member genetic testing information is needed - name, date of birth, phone number, relationship to patient (e.g. son, brother ...)2. Take vitamin A  4000 to 8000 units/day

## 2022-05-27 ENCOUNTER — Encounter: Admit: 2022-05-27 | Payer: PRIVATE HEALTH INSURANCE | Attending: Neuromuscular Medicine | Primary: Internal Medicine

## 2022-05-27 ENCOUNTER — Telehealth: Admit: 2022-05-27 | Payer: PRIVATE HEALTH INSURANCE | Attending: Urology | Primary: Internal Medicine

## 2022-05-27 ENCOUNTER — Ambulatory Visit: Admit: 2022-05-27 | Payer: PRIVATE HEALTH INSURANCE | Attending: Neuromuscular Medicine | Primary: Internal Medicine

## 2022-05-27 ENCOUNTER — Inpatient Hospital Stay: Admit: 2022-05-27 | Discharge: 2022-05-27 | Payer: PRIVATE HEALTH INSURANCE | Primary: Internal Medicine

## 2022-05-27 DIAGNOSIS — Z79899 Other long term (current) drug therapy: Secondary | ICD-10-CM

## 2022-05-27 DIAGNOSIS — E851 Neuropathic heredofamilial amyloidosis: Secondary | ICD-10-CM

## 2022-05-27 DIAGNOSIS — R5383 Other fatigue: Secondary | ICD-10-CM

## 2022-05-27 DIAGNOSIS — G63 Polyneuropathy in diseases classified elsewhere: Secondary | ICD-10-CM

## 2022-05-27 DIAGNOSIS — R633 Feeding difficulties, unspecified: Secondary | ICD-10-CM

## 2022-05-27 DIAGNOSIS — E1369 Other specified diabetes mellitus with other specified complication: Secondary | ICD-10-CM

## 2022-05-27 LAB — HEMOGLOBIN A1C
BKR ESTIMATED AVERAGE GLUCOSE: 134 mg/dL
BKR HEMOGLOBIN A1C: 6.3 % — ABNORMAL HIGH (ref 4.0–5.6)

## 2022-05-27 LAB — TSH W/REFLEX TO FT4     (BH GH LMW Q YH): BKR THYROID STIMULATING HORMONE: 2.81 u[IU]/mL

## 2022-05-27 LAB — PREALBUMIN: BKR PREALBUMIN: 3.2 mg/dL — ABNORMAL LOW (ref <=4.3)

## 2022-05-27 NOTE — Telephone Encounter
Pt noticed a new lump on his testicle yesterday, advised he needs an appointment to be seen. HE was hoping to speak with Dr. BucklHetty Blendbout it. LaureEarley Brookeas availability this Friday 8/11. FYI to Dr. Hetty Blend [facility] center please call to schedule appointment.

## 2022-05-27 NOTE — Telephone Encounter
Spoke to the patient.  States that he has a lump under the testicle.  Not attached to the testicle.  Suspect that it is an epididymal cyst but advised office visit for evaluation.  Okay to make appointment with APP

## 2022-05-27 NOTE — Telephone Encounter
Pt would like a nurse call back to discuss the growth in his testicle area. Pain level is a 2 or less

## 2022-05-28 LAB — VITAMIN D, 25-HYDROXY: BKR VITAMIN D 25-HYDROXY: 39 ng/mL (ref 30.0–100.0)

## 2022-06-06 ENCOUNTER — Inpatient Hospital Stay: Admit: 2022-06-06 | Discharge: 2022-06-06 | Payer: PRIVATE HEALTH INSURANCE | Primary: Internal Medicine

## 2022-06-06 ENCOUNTER — Ambulatory Visit
Admit: 2022-06-06 | Payer: PRIVATE HEALTH INSURANCE | Attending: Student in an Organized Health Care Education/Training Program | Primary: Internal Medicine

## 2022-06-06 ENCOUNTER — Encounter
Admit: 2022-06-06 | Payer: PRIVATE HEALTH INSURANCE | Attending: Student in an Organized Health Care Education/Training Program | Primary: Internal Medicine

## 2022-06-06 DIAGNOSIS — H25813 Combined forms of age-related cataract, bilateral: Secondary | ICD-10-CM

## 2022-06-06 LAB — CBC WITH AUTO DIFFERENTIAL
BKR WAM ABSOLUTE IMMATURE GRANULOCYTES.: 0.01 x 1000/??L (ref 0.00–0.30)
BKR WAM ABSOLUTE LYMPHOCYTE COUNT.: 1.46 x 1000/??L (ref 0.60–3.70)
BKR WAM ABSOLUTE NRBC (2 DEC): 0 x 1000/??L (ref 0.00–1.00)
BKR WAM ANALYZER ANC: 2.23 x 1000/??L (ref 2.00–7.60)
BKR WAM BASOPHIL ABSOLUTE COUNT.: 0.05 x 1000/??L (ref 0.00–1.00)
BKR WAM BASOPHILS: 1.2 % (ref 0.0–1.4)
BKR WAM EOSINOPHIL ABSOLUTE COUNT.: 0.03 x 1000/??L (ref 0.00–1.00)
BKR WAM EOSINOPHILS: 0.7 % (ref 0.0–5.0)
BKR WAM HEMATOCRIT (2 DEC): 43.1 % (ref 38.50–50.00)
BKR WAM HEMOGLOBIN: 14.2 g/dL (ref 13.2–17.1)
BKR WAM IMMATURE GRANULOCYTES: 0.2 % (ref 0.0–1.0)
BKR WAM LYMPHOCYTES: 35.6 % (ref 17.0–50.0)
BKR WAM MCH (PG): 29.3 pg (ref 27.0–33.0)
BKR WAM MCHC: 32.9 g/dL (ref 31.0–36.0)
BKR WAM MCV: 89 fL (ref 80.0–100.0)
BKR WAM MONOCYTE ABSOLUTE COUNT.: 0.32 x 1000/??L (ref 0.00–1.00)
BKR WAM MONOCYTES: 7.8 % (ref 4.0–12.0)
BKR WAM MPV: 9.5 fL — ABNORMAL LOW (ref 8.0–12.0)
BKR WAM NEUTROPHILS: 54.5 % (ref 39.0–72.0)
BKR WAM NUCLEATED RED BLOOD CELLS: 0 % (ref 0.0–1.0)
BKR WAM PLATELETS: 269 x1000/ÂµL (ref 150–420)
BKR WAM RDW-CV: 15.3 % — ABNORMAL HIGH (ref 11.0–15.0)
BKR WAM RED BLOOD CELL COUNT.: 4.84 M/ÂµL (ref 4.00–6.00)
BKR WAM WHITE BLOOD CELL COUNT: 4.1 x1000/ÂµL (ref 4.0–11.0)

## 2022-06-06 LAB — BASIC METABOLIC PANEL
BKR ANION GAP: 11 % (ref 7–17)
BKR BLOOD UREA NITROGEN: 51 mg/dL — ABNORMAL HIGH (ref 8–23)
BKR BUN / CREAT RATIO: 29.3 x1000/??L — ABNORMAL HIGH (ref 8.0–23.0)
BKR CALCIUM: 10.3 mg/dL — ABNORMAL HIGH (ref 8.8–10.2)
BKR CHLORIDE: 96 mmol/L — ABNORMAL LOW (ref 98–107)
BKR CO2: 26 mmol/L (ref 20–30)
BKR CREATININE: 1.74 mg/dL — ABNORMAL HIGH (ref 0.40–1.30)
BKR EGFR, CREATININE (CKD-EPI 2021): 42 mL/min/{1.73_m2} — ABNORMAL LOW (ref >=60)
BKR GLUCOSE: 110 mg/dL — ABNORMAL HIGH (ref 70–100)
BKR POTASSIUM: 4.9 mmol/L (ref 3.3–5.3)
BKR SODIUM: 133 mmol/L — ABNORMAL LOW (ref 136–144)

## 2022-06-07 LAB — VITAMIN E
ALPHA-TOCOPHEROL: 12.6 mg/L (ref 5.7–19.9)
BETA-GAMA TOCOPHEROL: <1 mg/L (ref <=4.3)

## 2022-06-07 LAB — VITAMIN A (BH GH LMW Q YH): VITAMIN A LEVEL: 11 ug/dL — ABNORMAL LOW (ref 38–98)

## 2022-06-10 ENCOUNTER — Ambulatory Visit: Admit: 2022-06-10 | Payer: PRIVATE HEALTH INSURANCE | Attending: Urology | Primary: Internal Medicine

## 2022-06-13 ENCOUNTER — Ambulatory Visit: Admit: 2022-06-13 | Payer: PRIVATE HEALTH INSURANCE | Attending: Nurse Practitioner | Primary: Internal Medicine

## 2022-06-15 ENCOUNTER — Ambulatory Visit: Admit: 2022-06-15 | Payer: PRIVATE HEALTH INSURANCE | Attending: Nurse Practitioner | Primary: Internal Medicine

## 2022-06-15 ENCOUNTER — Encounter: Admit: 2022-06-15 | Payer: PRIVATE HEALTH INSURANCE | Attending: Nurse Practitioner | Primary: Internal Medicine

## 2022-06-15 DIAGNOSIS — I10 Essential (primary) hypertension: Secondary | ICD-10-CM

## 2022-06-15 DIAGNOSIS — Z5189 Encounter for other specified aftercare: Secondary | ICD-10-CM

## 2022-06-15 DIAGNOSIS — F488 Other specified nonpsychotic mental disorders: Secondary | ICD-10-CM

## 2022-06-15 DIAGNOSIS — E119 Type 2 diabetes mellitus without complications: Secondary | ICD-10-CM

## 2022-06-15 DIAGNOSIS — I2510 Atherosclerotic heart disease of native coronary artery without angina pectoris: Secondary | ICD-10-CM

## 2022-06-15 DIAGNOSIS — K219 Gastro-esophageal reflux disease without esophagitis: Secondary | ICD-10-CM

## 2022-06-15 DIAGNOSIS — E7800 Pure hypercholesterolemia, unspecified: Secondary | ICD-10-CM

## 2022-06-15 DIAGNOSIS — G4733 Obstructive sleep apnea (adult) (pediatric): Secondary | ICD-10-CM

## 2022-06-15 DIAGNOSIS — I499 Cardiac arrhythmia, unspecified: Secondary | ICD-10-CM

## 2022-06-15 NOTE — Progress Notes
Frank Bradley is a 66 y.o.male who was seen for cardiac risk assessment prior to cataract surgery.Frank Bradley follows with Dr Hyacinth Meeker for a history of 1 vessel CABG (LIMA > LAD 2011 with AVR, last heart cath 2022 with mild CAD RCA and Lcx, bioprosthetic SAVR for bicuspid aortic valve, TTR cardiac amyloidosis, HFrEF 40%, AFib s/p ablation, OSA on CPAP, CKDMr Bradley presents to clinic alone. He reports overall remaining stable, participating in [place]. He has baseline DOE with ambulation on inclines/stairs and occasional increased SOB, not currently. Last week weight 165lb and he took an extra dose of torsemide (1 pill) with improvement weight to 158lbs. He reports overall weight loss over past 2 years. He denies chest pain, palpitations, edema, syncope, lightheaded/dizziness.He is planning for follow-up with nutrition and anticipates referral from his neurologist. Frank Bradley has the Visit Diagnosis of Congestive heart failure, unspecified HF chronicity, unspecified heart failure type (HC Code) (HC CODE) (HC Code) - Plan: EKG (Clinic Performed). Problem list:Patient Active Problem List  Diagnosis Date Noted ? Hypotension 02/23/2022 ? Cardiac amyloidosis (HC Code) (HC CODE) (HC Code) 02/23/2022 ? Decompensated heart failure (HC Code) (HC CODE) (HC Code) 04/25/2021 ? Acute systolic CHF (congestive heart failure) (HC Code) (HC CODE) (HC Code) 10/10/2016 ? Shortness of breath 10/09/2016 ? Elevated troponin 10/09/2016 ? Typical atrial flutter (HC Code) (HC CODE) (HC Code) 07/28/2016 ? Atrial fibrillation, unspecified type (HC Code) (HC CODE) (HC Code) 06/02/2016 ? Coronary artery disease involving native coronary artery of native heart without angina pectoris 06/02/2016 ? S/P cryoablation of arrhythmia 06/02/2016   10/07/16: Cryoablation/PVI and RFA of baseline AFLutterAblated to sinusThen transseptal to LAIsolated PVs ? S/P CABG (coronary artery bypass graft) 06/02/2016 ? Type 2 diabetes mellitus (HC Code) 06/02/2016 ? Sleep apnea 06/02/2016 Medications:Current Outpatient Medications Medication Sig Dispense Refill ? allopurinoL (ZYLOPRIM) 300 mg tablet Take 1 tablet (300 mg total) by mouth daily.   ? ammonium lactate (AMLACTIN) 12 % cream    ? doxycycline hyclate (VIBRA-TABS) 100 mg tablet    ? JARDIANCE 10 mg tablet Take 1 tablet (10 mg total) by mouth daily before breakfast.   ? lansoprazole (PREVACID) 15 mg capsule Take 1 capsule (15 mg total) by mouth daily as needed.   ? magnesium oxide (MAG-OX) 400 mg (241.3 mg magnesium) tablet Take 1 tablet (400 mg total) by mouth daily.   ? metFORMIN (GLUCOPHAGE) 500 MG Immediate Release tablet Take 1 tablet (500 mg total) by mouth 2 (two) times daily with breakfast and dinner.   ? midodrine (PROAMATINE) 10 mg tablet Take 1 tablet (10 mg total) by mouth 3 (three) times daily with meals.   ? nystatin (MYCOSTATIN) 100,000 unit/gram ointment APPLY DAILY TO SKIN TO AFFECTED AREA TWICE A DAY FOR 30 DAYS   ? ONETOUCH VERIO test strips    ? rivaroxaban (XARELTO) 20 mg Tab tablet 1 tablet (20 mg total) Daily @1700 .   ? rosuvastatin (CRESTOR) 5 MG tablet Take 1 tablet (5 mg total) by mouth nightly.   ? spironolactone (ALDACTONE) 25 mg tablet Take 0.5 tablets (12.5 mg total) by mouth daily.   ? tafamidis (VYNDAMAX) 61 mg capsule Take 1 capsule (61 mg total) by mouth daily.   ? torsemide (DEMADEX) 20 mg tablet Take 2 tablets (40 mg total) by mouth daily as needed. Take torsemide only if weight gain > 3 pounds from your baseline. If at your baseline weight, do not take the torsemide. 60 tablet 4 ? vutrisiran (AMVUTTRA) 25 mg/0.5 mL Syrg syringe Inject  25 mg under the skin every 3 (three) months.   No current facility-administered medications for this visit. Allergies:He is allergic to penicillin g and atorvastatin.Other Histories:Medical History  He    has a past medical history of Arrhythmia, Chronic coronary artery disease, Diabetes mellitus (HC Code), Encounter for blood transfusion, GERD (gastroesophageal reflux disease), Hypercholesteremia, Hypertension, Obstructive sleep apnea, and Syncope, psychogenic. Surgical History  He  has a past surgical history that includes Tooth extraction; Valve replacement; and Cervical disc arthroplasty. Social History  He  reports that he has quit smoking. His smoking use included cigarettes. He has never used smokeless tobacco. He reports that he does not currently use alcohol. He reports that he does not use drugs. Family History He Family History Problem Relation Age of Onset ? Heart disease Mother  ? Diabetes Mother  ? Stroke Mother  ? Heart failure Mother  ? Heart disease Father  ? Heart attack Father  ? Diabetes Sister  ? Diabetes Brother   The following ROS documentation is the transcribed Review of System completed by the patient at intake.Review of Systems Constitutional: Positive for weight loss. Cardiovascular: Positive for dyspnea on exertion. Negative for chest pain, irregular heartbeat, leg swelling, orthopnea, palpitations, paroxysmal nocturnal dyspnea and syncope. Respiratory: Negative.  Skin: Negative.  Musculoskeletal: Positive for gout. Gastrointestinal: Negative for constipation, diarrhea, nausea and vomiting. Genitourinary: Negative.  Neurological: Positive for numbness and paresthesias. Physical ExamConstitutional:     General: He is not in acute distress.   Appearance: He is not ill-appearing, toxic-appearing or diaphoretic. HENT:    Head: Normocephalic. Eyes:    General: No scleral icterus.Neck:    Comments: JVP ~ 8Cardiovascular:    Rate and Rhythm: Normal rate and regular rhythm.    Pulses: Normal pulses.    Heart sounds: No murmur heard.Pulmonary:    Effort: Pulmonary effort is normal. No respiratory distress. Abdominal: General: There is no distension. Musculoskeletal:    Right lower leg: No edema.    Left lower leg: No edema. Skin:   General: Skin is warm and dry.    Capillary Refill: Capillary refill takes less than 2 seconds. Neurological:    General: No focal deficit present.    Mental Status: He is alert and oriented to person, place, and time. Psychiatric:       Mood and Affect: Mood normal.       Behavior: Behavior normal.       Thought Content: Thought content normal.       Judgment: Judgment normal. Vital Signs: BP 114/72 (Site: l a, Position: Sitting, Cuff Size: Medium)  - Pulse 82  - Temp 97 ?F (36.1 ?C) (Temporal)  - Ht 6' 2 (1.88 m)  - Wt 72.2 kg  - SpO2 100%  - BMI 20.44 kg/m?  Wt Readings from Last 3 Encounters: 06/15/22 72.2 kg 05/26/22 74.8 kg 05/17/22 71.7 kg  LABS: Results for orders placed or performed in visit on 06/15/22 EKG (Clinic Performed) Result Value Ref Range  Heart Rate 67 bpm  QRS Interval 147 ms  QT Interval 452 ms  QTC Interval 462 ms  P Axis 77 deg  QRS Axis 65 deg  T Wave Axis 43 deg  P-R Interval 249 msec  SEVERITY Abnormal ECG severity   Latest Reference Range & Units 05/27/22 15:47 06/06/22 16:17 Sodium 136 - 144 mmol/L  133 (L) Potassium 3.3 - 5.3 mmol/L  4.9 Chloride 98 - 107 mmol/L  96 (L) CO2 20 - 30 mmol/L  26 Anion Gap 7 -  17   11 BUN 8 - 23 mg/dL  51 (H) Creatinine 7.82 - 1.30 mg/dL  9.56 (H) BUN/Creatinine Ratio 8.0 - 23.0   29.3 (H) eGFR (Creatinine) >=60 mL/min/1.12m2  42 (L) Glucose 70 - 100 mg/dL  213 (H) Calcium 8.8 - 10.2 mg/dL  08.6 (H) Prealbumin 57.8 - 40.0 mg/dL 3.2 (L)  Thyroid Stimulating Hormone, 3rd Gen. See Comment ?IU/mL 2.810  Vitamin A 38 - 98 mcg/dL 11 (L)  Alpha-Tocopherol 5.7 - 19.9 mg/L 12.6  Estimated Average Glucose mg/dL 469  Hemoglobin G2X 4.0 - 5.6 % 6.3 (H)  Vitamin D25-Hydroxy 30.0 - 100.0 ng/mL 39.0  WBC 4.0 - 11.0 x1000/?L  4.1 RBC 4.00 - 6.00 M/?L  4.84 Hemoglobin 13.2 - 17.1 g/dL  52.8 Hematocrit 41.32 - 50.00 %  43.10 MCV 80.0 - 100.0 fL  89.0 MCH 27.0 - 33.0 pg  29.3 MCHC 31.0 - 36.0 g/dL  44.0 RDW-CV 10.2 - 72.5 %  15.3 (H) Platelets 150 - 420 x1000/?L  269 MPV 8.0 - 12.0 fL  9.5 Neutrophils 39.0 - 72.0 %  54.5 Lymphocytes 17.0 - 50.0 %  35.6 Monocytes 4.0 - 12.0 %  7.8 Eosinophils 0.0 - 5.0 %  0.7 Basophils 0.0 - 1.4 %  1.2 Immature Granulocytes 0.0 - 1.0 %  0.2 nRBC 0.0 - 1.0 %  0.0 ANC (Abs Neutrophil Count) 2.00 - 7.60 x 1000/?L  2.23 Absolute Lymphocyte Count 0.60 - 3.70 x 1000/?L  1.46 Monocytes (Absolute) 0.00 - 1.00 x 1000/?L  0.32 Eosinophil Absolute Count 0.00 - 1.00 x 1000/?L  0.03 Basophils Absolute 0.00 - 1.00 x 1000/?L  0.05 Immature Granulocytes (Abs) 0.00 - 0.30 x 1000/?L  0.01 nRBC Absolute 0.00 - 1.00 x 1000/?L  0.00 (L): Data is abnormally low(H): Data is abnormally highCardiographics:EKG: SR 67 RBBB with PACsECHO: 02/23/22~ * Limited Echocardiogram~ * Moderately decreased left ventricular systolic function. The left ventricular ejection fraction calculated by biplane Simpson's was 38%.  No thrombus visualized in the left ventricle. Moderately increased left ventricular cavity size.  Severe concentric left ventricular hypertrophy.  Flattened septum in systole consistent with right ventricular pressure overload.~ * Moderately increased right ventricular cavity size.  Mildly decreased right ventricular systolic function.  Estimated right ventricular systolic pressure is 44 mmHg compatible with pulmonary hypertension. The right ventricular systolic pressure may be underestimated.~ * A bioprosthetic aortic valve is present.  Spectral doppler was not performed across the aortic valve on this study.~ * Mild mitral regurgitation.~ * Severe tricuspid regurgitation.~ * Hepatic vein flow shows systolic flow reversal.  IVC diameter > 2.1 cm that collapses < 50% with a sniff suggests high RAP (10-20 mmHg, mean 15 mmHg).~ * No significant pericardial effusion.~ * No prior transthoracic study available for comparison.CPET 03/29/22-~ * 1) The peak VO2 was 9.8 mL/kg/min (753 mL/min; 2.8 METS) which was 38% of  maximum predicted.  Anaerobic threshold was not obtained.  Baseline pulmonary function testing showed moderate restriction with abnormal FEV1 of 2.65 (77% predicted), abnormal FVC of 2.9 (64% predicted) and abnormal FEV1/FVC of 91 (120% predicted).  Overall, the findings of this test were indicative of no pulmonary limitation to exercise. Cannot evaluate for circulatory limitation due to not achieving their anaerobic threshold, Wave forms suggests Yahoo respiratory pattern.~ * 2) Nondiagnostic stress treadmill ECG test due to an abnormal baseline ECG.  The patient exercised at a poor workload for age and gender for a total of 7 minutes and 52 seconds using the FIXCPX study protocol.  Exercise capacity  was 3.3 METs.  The maximal heart rate was 96 bpm which represents 63% of the age-predicted maximal heart rate not achieving target heart rate.  Peak stress blood pressure was hypertensive for achieved workload at 152/83 mmHg.~ * 3) No prior study available for comparison.cMRI 09/22/2021 -IMPRESSION: 1. Mild LVE with moderate LVH diffuse hypokinesis EF 40% 2. Markedly abnormal post gadolinium images with 41.8% myocardial uptake over all axial slices Although there is a subendocardial and mid myocardial predominance and worse uptake in the basal segments there is also transmural uptake in some areas not contained to a coronary distribution 3. Degenerative appearing Bioprosthetic AVR with mild appearing AR and thickened leaflets 4. ?Mild RVE/RV hypokinesis RVEF 46%  Assessment and Plan:Frank Bradley is a 66 y.o.male who follows with Dr Hyacinth Meeker for a history of 1 vessel CABG LIMA > LAD 2011 with AVR, last heart cath 2022 with mild CAD RCA and Lcx, bioprosthetic SAVR for bicuspid aortic valve, hATTR cardiac amyloidosis, HFrEF 40%, AFib s/p ablation, OSA on CPAP, CKD, dysautonomia, neuropathy; was seen for cardiac risk assessment prior to cataract surgery.#HFmrEF 40%, cardiac amyloidosis, NYHA class III, warm and euvolemic- Continue midodrine 10mg  TID- Continue Jardiance 10mg  daily- Continue Spironolactone  12.5mg  daily- Continue torsemide - taking 10mg  daily, and 20mg  PRN for weight gain 3 lbs/ DOE/edema- Continue Tafamidis 61mg  daily- Continue Amvuttra injection 25mg  every 3 months#CAD, 1v CABG - Continue crestor 5mg  nightly- continue AC as below#Hx Afib s/p Ablation, currently SR- continue rivaroxaban 20mg  daily#hx bioprosethetic SAVR for bicuspid aortic valve- antibiotic ppx doxy for procedures- Continue AC as aboveCardiac Risk Assessment:Ischemia: EKG SR with PAC, RBBB at baseline- He does not report signs or symptoms suggestive of ischemic disease- No further cardiac testing at this time that would mitigate perioperative riskArrhythmia: EKG SR, RBBB at baseline, Hx of Afib s/p ablation- He does not report any chest palpitations or rapid heart rateHeart Failure: NYHA class III, Echo May 2023 with EF 38%, flattened septum, RV dysfunction, severe TR - Blood pressure and volume status are optimizedRevised Cardiac Risk Index for Pre-Operative Risk: 2 Point for Class III risk with 10.1.0% 30 day risk of adverse cardiovascular event.  At this time, the patient is optimized and has no cardiovascular contraindications to proceeding with cataract extractionRecommend close pre/intra and post op monitoring of hemodynamics, fluid management, and for any arrhythmias.RCRI Score (1 point for each):High-Risk Surgery                                NoIntraperitonealIntrathoracicSuprainguinal vascularHistory of ischemic heart disease           YesHistory of MIHistory of positive exercise testCurrent chest pain due to myocardial ischemiaUse of nitrate therapyECG with pathological Q wavesHistory of CHF                                      YesPulmonary edema, bilateral rales, S3 gallopParoxysmal nocturnal dyspneaCXR with pulmonary vascular redistributionHistory of cerebrovascular disease         NoPrior TIA or strokePre-operative treatment with insulin        NoPre-operative creatinine >2 mg/dL          NoI encouraged the patient to call me if they have any questions and more importantly any new symptoms.

## 2022-06-16 ENCOUNTER — Encounter: Admit: 2022-06-16 | Payer: PRIVATE HEALTH INSURANCE | Attending: Neuromuscular Medicine | Primary: Internal Medicine

## 2022-06-16 DIAGNOSIS — I509 Heart failure, unspecified: Secondary | ICD-10-CM

## 2022-06-29 ENCOUNTER — Emergency Department: Admit: 2022-06-29 | Payer: PRIVATE HEALTH INSURANCE | Primary: Internal Medicine

## 2022-06-29 ENCOUNTER — Encounter: Admit: 2022-06-29 | Payer: PRIVATE HEALTH INSURANCE | Primary: Internal Medicine

## 2022-06-29 ENCOUNTER — Inpatient Hospital Stay: Admit: 2022-06-29 | Discharge: 2022-06-30 | Payer: PRIVATE HEALTH INSURANCE | Primary: Internal Medicine

## 2022-06-29 DIAGNOSIS — K219 Gastro-esophageal reflux disease without esophagitis: Secondary | ICD-10-CM

## 2022-06-29 DIAGNOSIS — Z5189 Encounter for other specified aftercare: Secondary | ICD-10-CM

## 2022-06-29 DIAGNOSIS — I10 Essential (primary) hypertension: Secondary | ICD-10-CM

## 2022-06-29 DIAGNOSIS — E119 Type 2 diabetes mellitus without complications: Secondary | ICD-10-CM

## 2022-06-29 DIAGNOSIS — F488 Other specified nonpsychotic mental disorders: Secondary | ICD-10-CM

## 2022-06-29 DIAGNOSIS — E7800 Pure hypercholesterolemia, unspecified: Secondary | ICD-10-CM

## 2022-06-29 DIAGNOSIS — I2510 Atherosclerotic heart disease of native coronary artery without angina pectoris: Secondary | ICD-10-CM

## 2022-06-29 DIAGNOSIS — G4733 Obstructive sleep apnea (adult) (pediatric): Secondary | ICD-10-CM

## 2022-06-29 DIAGNOSIS — I499 Cardiac arrhythmia, unspecified: Secondary | ICD-10-CM

## 2022-06-29 LAB — COMPREHENSIVE METABOLIC PANEL
BKR A/G RATIO: 1 (ref 1.0–2.2)
BKR ALANINE AMINOTRANSFERASE (ALT): 11 U/L (ref 9–59)
BKR ALBUMIN: 4.2 g/dL (ref 3.6–4.9)
BKR ALKALINE PHOSPHATASE: 123 U/L — ABNORMAL HIGH (ref 9–122)
BKR ANION GAP: 12 (ref 7–17)
BKR ASPARTATE AMINOTRANSFERASE (AST): 41 U/L — ABNORMAL HIGH (ref 10–35)
BKR AST/ALT RATIO: 3.7
BKR BILIRUBIN TOTAL: 1.1 mg/dL (ref <=1.2)
BKR BLOOD UREA NITROGEN: 42 mg/dL — ABNORMAL HIGH (ref 8–23)
BKR BUN / CREAT RATIO: 21.6 (ref 8.0–23.0)
BKR CALCIUM: 9.9 mg/dL (ref 8.8–10.2)
BKR CHLORIDE: 101 mmol/L (ref 98–107)
BKR CO2: 21 mmol/L (ref 20–30)
BKR CREATININE: 1.94 mg/dL — ABNORMAL HIGH (ref 0.40–1.30)
BKR EGFR, CREATININE (CKD-EPI 2021): 37 mL/min/{1.73_m2} — ABNORMAL LOW (ref >=60)
BKR GLOBULIN: 4.1 g/dL — ABNORMAL HIGH (ref 2.3–3.5)
BKR GLUCOSE: 98 mg/dL (ref 70–100)
BKR POTASSIUM: 4.5 mmol/L (ref 3.3–5.3)
BKR PROTEIN TOTAL: 8.3 g/dL (ref 6.6–8.7)
BKR SODIUM: 134 mmol/L — ABNORMAL LOW (ref 136–144)

## 2022-06-29 LAB — CBC WITH AUTO DIFFERENTIAL
BKR WAM ABSOLUTE IMMATURE GRANULOCYTES.: 0.01 x 1000/ÂµL (ref 0.00–0.30)
BKR WAM ABSOLUTE LYMPHOCYTE COUNT.: 1.14 x 1000/ÂµL (ref 0.60–3.70)
BKR WAM ABSOLUTE NRBC (2 DEC): 0 x 1000/ÂµL (ref 0.00–1.00)
BKR WAM ANALYZER ANC: 2.79 x 1000/ÂµL (ref 2.00–7.60)
BKR WAM BASOPHIL ABSOLUTE COUNT.: 0.05 x 1000/ÂµL (ref 0.00–1.00)
BKR WAM BASOPHILS: 1.1 % (ref 0.0–1.4)
BKR WAM EOSINOPHIL ABSOLUTE COUNT.: 0.02 x 1000/ÂµL (ref 0.00–1.00)
BKR WAM EOSINOPHILS: 0.5 % (ref 0.0–5.0)
BKR WAM HEMATOCRIT (2 DEC): 39.1 % (ref 38.50–50.00)
BKR WAM HEMOGLOBIN: 13.1 g/dL — ABNORMAL LOW (ref 13.2–17.1)
BKR WAM IMMATURE GRANULOCYTES: 0.2 % (ref 0.0–1.0)
BKR WAM LYMPHOCYTES: 26.1 % (ref 17.0–50.0)
BKR WAM MCH (PG): 28.4 pg (ref 27.0–33.0)
BKR WAM MCHC: 33.5 g/dL (ref 31.0–36.0)
BKR WAM MCV: 84.8 fL (ref 80.0–100.0)
BKR WAM MONOCYTE ABSOLUTE COUNT.: 0.35 x 1000/ÂµL (ref 0.00–1.00)
BKR WAM MONOCYTES: 8 % (ref 4.0–12.0)
BKR WAM MPV: 9.9 fL (ref 8.0–12.0)
BKR WAM NEUTROPHILS: 64.1 % (ref 39.0–72.0)
BKR WAM NUCLEATED RED BLOOD CELLS: 0 % (ref 0.0–1.0)
BKR WAM PLATELETS: 256 x1000/ÂµL (ref 150–420)
BKR WAM RDW-CV: 15.9 % — ABNORMAL HIGH (ref 11.0–15.0)
BKR WAM RED BLOOD CELL COUNT.: 4.61 M/ÂµL (ref 4.00–6.00)
BKR WAM WHITE BLOOD CELL COUNT: 4.4 x1000/ÂµL (ref 4.0–11.0)

## 2022-06-29 LAB — MAGNESIUM: BKR MAGNESIUM: 2.5 mg/dL — ABNORMAL HIGH (ref 1.7–2.4)

## 2022-06-29 LAB — TROPONIN T HIGH SENSITIVITY, 0 HOUR BASELINE WITH REFLEX (BH GH LMW YH): BKR TROPONIN T HS 0 HOUR BASELINE: 80 ng/L — ABNORMAL HIGH

## 2022-06-29 LAB — NT-PROBNPE: BKR B-TYPE NATRIURETIC PEPTIDE, PRO (PROBNP): 1896 pg/mL — ABNORMAL HIGH (ref <125.0)

## 2022-06-29 NOTE — ED Triage Note
I evaluated this patient as the Physician in triage and completed a medical screening examination.  Patient will need further care.  Initial care orders will be placed as appropriate.Jerrye Bushy, MD3:29 PM

## 2022-06-30 ENCOUNTER — Ambulatory Visit: Admit: 2022-06-30 | Payer: PRIVATE HEALTH INSURANCE | Primary: Internal Medicine

## 2022-06-30 ENCOUNTER — Ambulatory Visit: Admit: 2022-06-30 | Payer: PRIVATE HEALTH INSURANCE | Attending: Urology | Primary: Internal Medicine

## 2022-06-30 DIAGNOSIS — Z880 Allergy status to penicillin: Secondary | ICD-10-CM

## 2022-06-30 DIAGNOSIS — Z91128 Patient's intentional underdosing of medication regimen for other reason: Secondary | ICD-10-CM

## 2022-06-30 DIAGNOSIS — I2510 Atherosclerotic heart disease of native coronary artery without angina pectoris: Secondary | ICD-10-CM

## 2022-06-30 DIAGNOSIS — T50906A Underdosing of unspecified drugs, medicaments and biological substances, initial encounter: Secondary | ICD-10-CM

## 2022-06-30 DIAGNOSIS — Z7901 Long term (current) use of anticoagulants: Secondary | ICD-10-CM

## 2022-06-30 DIAGNOSIS — R791 Abnormal coagulation profile: Secondary | ICD-10-CM

## 2022-06-30 DIAGNOSIS — E7800 Pure hypercholesterolemia, unspecified: Secondary | ICD-10-CM

## 2022-06-30 DIAGNOSIS — H5789 Other specified disorders of eye and adnexa: Secondary | ICD-10-CM

## 2022-06-30 DIAGNOSIS — I5042 Chronic combined systolic (congestive) and diastolic (congestive) heart failure: Secondary | ICD-10-CM

## 2022-06-30 DIAGNOSIS — E119 Type 2 diabetes mellitus without complications: Secondary | ICD-10-CM

## 2022-06-30 DIAGNOSIS — Z79899 Other long term (current) drug therapy: Secondary | ICD-10-CM

## 2022-06-30 DIAGNOSIS — R0602 Shortness of breath: Secondary | ICD-10-CM

## 2022-06-30 DIAGNOSIS — Z87891 Personal history of nicotine dependence: Secondary | ICD-10-CM

## 2022-06-30 DIAGNOSIS — Z888 Allergy status to other drugs, medicaments and biological substances status: Secondary | ICD-10-CM

## 2022-06-30 DIAGNOSIS — K219 Gastro-esophageal reflux disease without esophagitis: Secondary | ICD-10-CM

## 2022-06-30 DIAGNOSIS — I110 Hypertensive heart disease with heart failure: Secondary | ICD-10-CM

## 2022-06-30 LAB — CBC WITH AUTO DIFFERENTIAL
BKR WAM ABSOLUTE IMMATURE GRANULOCYTES.: 0.03 x 1000/ÂµL (ref 0.00–0.30)
BKR WAM ABSOLUTE LYMPHOCYTE COUNT.: 1.16 x 1000/ÂµL (ref 0.60–3.70)
BKR WAM ABSOLUTE NRBC (2 DEC): 0 x 1000/ÂµL (ref 0.00–1.00)
BKR WAM ANALYZER ANC: 1.77 x 1000/ÂµL — ABNORMAL LOW (ref 2.00–7.60)
BKR WAM BASOPHIL ABSOLUTE COUNT.: 0.04 x 1000/ÂµL (ref 0.00–1.00)
BKR WAM BASOPHILS: 1.2 % (ref 0.0–1.4)
BKR WAM EOSINOPHIL ABSOLUTE COUNT.: 0.03 x 1000/ÂµL (ref 0.00–1.00)
BKR WAM EOSINOPHILS: 0.9 % (ref 0.0–5.0)
BKR WAM HEMATOCRIT (2 DEC): 36.5 % — ABNORMAL LOW (ref 38.50–50.00)
BKR WAM HEMOGLOBIN: 12.2 g/dL — ABNORMAL LOW (ref 13.2–17.1)
BKR WAM IMMATURE GRANULOCYTES: 0.9 % (ref 0.0–1.0)
BKR WAM LYMPHOCYTES: 33.7 % (ref 17.0–50.0)
BKR WAM MCH (PG): 28.2 pg (ref 27.0–33.0)
BKR WAM MCHC: 33.4 g/dL (ref 31.0–36.0)
BKR WAM MCV: 84.3 fL (ref 80.0–100.0)
BKR WAM MONOCYTE ABSOLUTE COUNT.: 0.41 x 1000/ÂµL (ref 0.00–1.00)
BKR WAM MONOCYTES: 11.9 % (ref 4.0–12.0)
BKR WAM MPV: 10.2 fL (ref 8.0–12.0)
BKR WAM NEUTROPHILS: 51.4 % (ref 39.0–72.0)
BKR WAM NUCLEATED RED BLOOD CELLS: 0 % (ref 0.0–1.0)
BKR WAM PLATELETS: 237 x1000/ÂµL (ref 150–420)
BKR WAM RDW-CV: 15.6 % — ABNORMAL HIGH (ref 11.0–15.0)
BKR WAM RED BLOOD CELL COUNT.: 4.33 M/ÂµL (ref 4.00–6.00)
BKR WAM WHITE BLOOD CELL COUNT: 3.4 x1000/ÂµL — ABNORMAL LOW (ref 4.0–11.0)

## 2022-06-30 LAB — BASIC METABOLIC PANEL
BKR ANION GAP: 10 (ref 7–17)
BKR BLOOD UREA NITROGEN: 39 mg/dL — ABNORMAL HIGH (ref 8–23)
BKR BUN / CREAT RATIO: 27.3 — ABNORMAL HIGH (ref 8.0–23.0)
BKR CALCIUM: 9.6 mg/dL (ref 8.8–10.2)
BKR CHLORIDE: 101 mmol/L (ref 98–107)
BKR CO2: 22 mmol/L (ref 20–30)
BKR CREATININE: 1.43 mg/dL — ABNORMAL HIGH (ref 0.40–1.30)
BKR EGFR, CREATININE (CKD-EPI 2021): 53 mL/min/{1.73_m2} — ABNORMAL LOW (ref >=60)
BKR GLUCOSE: 79 mg/dL (ref 70–100)
BKR POTASSIUM: 4.4 mmol/L (ref 3.3–5.3)
BKR SODIUM: 133 mmol/L — ABNORMAL LOW (ref 136–144)

## 2022-06-30 LAB — TROPONIN T HIGH SENSITIVITY, 1 HOUR WITH REFLEX (BH GH LMW YH)
BKR TROPONIN T HS 1 HOUR DELTA FROM 0 HOUR: -14 ng/L
BKR TROPONIN T HS 1 HOUR: 66 ng/L — ABNORMAL HIGH

## 2022-06-30 LAB — D-DIMER, QUANTITATIVE: BKR D-DIMER: 2.51 mg{FEU}/L — ABNORMAL HIGH (ref <=0.68)

## 2022-06-30 LAB — TROPONIN T HIGH SENSITIVITY, 3 HOUR (BH GH LMW YH)
BKR TROPONIN T HS 1 HOUR DELTA FROM 0 HOUR ON 3HR: -14 ng/L
BKR TROPONIN T HS 3 HOUR DELTA FROM 0 HOUR: -17 ng/L
BKR TROPONIN T HS 3 HOUR: 63 ng/L — ABNORMAL HIGH

## 2022-06-30 MED ORDER — DEXTROSE 10 % IV BOLUS FOR ORDERABLE
INTRAVENOUS | Status: DC | PRN
Start: 2022-06-30 — End: 2022-07-01

## 2022-06-30 MED ORDER — SODIUM CHLORIDE 0.9 % (FLUSH) INJECTION SYRINGE
0.9 % | INTRAVENOUS | Status: DC | PRN
Start: 2022-06-30 — End: 2022-07-01

## 2022-06-30 MED ORDER — SPIRONOLACTONE (ALDACTONE) 12.5 MG HALFTAB
12.5 mg | Freq: Every day | ORAL | Status: DC
Start: 2022-06-30 — End: 2022-07-01
  Administered 2022-06-30: 16:00:00 12.5 mg via ORAL

## 2022-06-30 MED ORDER — SKIM MILK
ORAL | Status: DC | PRN
Start: 2022-06-30 — End: 2022-07-01

## 2022-06-30 MED ORDER — GLUCAGON 1 MG/ML IN STERILE WATER
Freq: Once | INTRAMUSCULAR | Status: DC | PRN
Start: 2022-06-30 — End: 2022-07-01

## 2022-06-30 MED ORDER — FUROSEMIDE 10 MG/ML INJECTION SOLUTION
10 mg/mL | INTRAVENOUS | Status: DC
Start: 2022-06-30 — End: 2022-07-01
  Administered 2022-06-30: 13:00:00 10 mL via INTRAVENOUS

## 2022-06-30 MED ORDER — FRUIT JUICE
ORAL | Status: DC | PRN
Start: 2022-06-30 — End: 2022-07-01

## 2022-06-30 MED ORDER — LACTULOSE 20 GRAM/30 ML ORAL SOLUTION
20 gram/30 mL | Freq: Three times a day (TID) | ORAL | Status: DC | PRN
Start: 2022-06-30 — End: 2022-07-01
  Administered 2022-06-30: 16:00:00 20 mL via ORAL

## 2022-06-30 MED ORDER — TECHNETIUM TC-99M ALBUMIN AGGREGATED
Freq: Once | INTRAVENOUS | Status: CP | PRN
Start: 2022-06-30 — End: ?
  Administered 2022-06-30: 19:00:00 via INTRAVENOUS

## 2022-06-30 MED ORDER — ROSUVASTATIN 5 MG TABLET
5 mg | Freq: Every evening | ORAL | Status: DC
Start: 2022-06-30 — End: 2022-07-01

## 2022-06-30 MED ORDER — SODIUM CHLORIDE 0.9 % (FLUSH) INJECTION SYRINGE
0.9 % | Freq: Three times a day (TID) | INTRAVENOUS | Status: DC
Start: 2022-06-30 — End: 2022-07-01
  Administered 2022-06-30 (×2): 0.9 mL via INTRAVENOUS

## 2022-06-30 MED ORDER — INSULIN LISPRO 100 UNIT/ML (SLIDING SCALE)
100 unit/mL | Freq: Before meals | SUBCUTANEOUS | Status: DC
Start: 2022-06-30 — End: 2022-07-01

## 2022-06-30 MED ORDER — OFLOXACIN 0.3 % EYE DROPS
0.3 % | Freq: Every day | OPHTHALMIC | Status: AC
Start: 2022-06-30 — End: ?

## 2022-06-30 MED ORDER — ZOLMITRIPTAN 5 MG TABLET
5 mg | ORAL | Status: AC | PRN
Start: 2022-06-30 — End: ?

## 2022-06-30 MED ORDER — PREDNISOLONE ACETATE 1 % EYE DROPS,SUSPENSION
1 % | Freq: Two times a day (BID) | OPHTHALMIC | Status: AC
Start: 2022-06-30 — End: ?

## 2022-06-30 MED ORDER — MAGNESIUM OXIDE 400 MG (241.3 MG MAGNESIUM) TABLET
400 mg (241.3 mg magnesium) | Freq: Every day | ORAL | Status: DC
Start: 2022-06-30 — End: 2022-07-01

## 2022-06-30 MED ORDER — DEXTROSE 40 % ORAL GEL
40 % | ORAL | Status: DC | PRN
Start: 2022-06-30 — End: 2022-07-01

## 2022-06-30 MED ORDER — RIVAROXABAN 10 MG TABLET
10 mg | Freq: Every day | ORAL | Status: DC
Start: 2022-06-30 — End: 2022-07-01
  Administered 2022-06-30: 21:00:00 10 mg via ORAL

## 2022-06-30 MED ORDER — DIPHENHYDRAMINE 25 MG CAPSULE
25 mg | Freq: Four times a day (QID) | ORAL | Status: DC | PRN
Start: 2022-06-30 — End: 2022-07-01

## 2022-06-30 MED ORDER — GENTAMICIN 0.3 % EYE DROPS
0.3 % | Freq: Three times a day (TID) | OPHTHALMIC | Status: DC
Start: 2022-06-30 — End: 2022-07-01
  Administered 2022-06-30 (×2): 0.3 mL via OPHTHALMIC

## 2022-06-30 MED ORDER — MIDODRINE 5 MG TABLET
5 mg | Freq: Three times a day (TID) | ORAL | Status: DC
Start: 2022-06-30 — End: 2022-07-01
  Administered 2022-06-30 (×3): 5 mg via ORAL

## 2022-06-30 MED ORDER — TECHNETIUM TC-99M PENTETATE (TC99-DTPA) AEROSOL
Freq: Once | RESPIRATORY_TRACT | Status: CP | PRN
Start: 2022-06-30 — End: ?
  Administered 2022-06-30: 18:00:00 via RESPIRATORY_TRACT

## 2022-06-30 NOTE — Progress Notes
[facility] Progress NoteAttending Provider: No att. providers found Subjective                                                                              Subjective: Patient examined at bedside, in no acute distress.  Patient agrees with V/Q scan to rule out PE and if negative can be discharged home.Review of Allergies/Meds/Hx: Review of Allergies/Meds/Hx:I have reviewed the patient's: current scheduled medications Objective Objective: Vitals:Last 24 hours: Temp:  [97.2 ?F (36.2 ?C)-97.6 ?F (36.4 ?C)] 97.4 ?F (36.3 ?C)Pulse:  [56-73] 58Resp:  [16-20] 18BP: (104-132)/(70-83) 132/83SpO2:  [99 %-100 %] 100 %Physical Exam Constitutional:     Comments: Elderly  HENT:    Head: Normocephalic and atraumatic.    Right Ear: Tympanic membrane, ear canal and external ear normal.    Left Ear: Tympanic membrane, ear canal and external ear normal.    Nose: Nose normal.    Mouth/Throat:    Pharynx: Oropharynx is clear. Eyes:    General:       Right eye: Discharge present. Cardiovascular:    Rate and Rhythm: Bradycardia present. Pulmonary:    Effort: Pulmonary effort is normal. Abdominal:    General: Bowel sounds are normal. There is no distension.    Palpations: There is no mass.    Tenderness: There is no abdominal tenderness. There is no right CVA tenderness, left CVA tenderness, guarding or rebound.    Hernia: No hernia is present. Musculoskeletal:       General: Normal range of motion.    Cervical back: Normal range of motion. Skin:   General: Skin is warm. Neurological:    General: No focal deficit present.    Mental Status: He is alert and oriented to person, place, and time. Mental status is at baseline. Psychiatric:       Mood and Affect: Mood normal.       Behavior: Behavior normal.       Thought Content: Thought content normal.       Judgment: Judgment normal. Labs:I have reviewed the patient's labs within the last 24 hrs.Diagnostics:I have reviewed the patient's Radiology report(s) within the last 48 hrs. All results are within normal limits.ECG/Tele Events: N/A Assessment Assessment: Assessment:66 yo male under observation for dyspnea workup, rule out pulmonary embolism with V/Q scan. Plan Plan: Will place under observation to monitored bed. 1. Dyspnea: could be due to fluid overload, will start on iv lasix, daily weight, strict I/O's, cardio eval. VQ scan- follow up, if negative can be discharged home.  2. Resume home meds. 3. Right eye recent surgery continue eye drops. 4. On Xarelto for dvt prophylaxis. I have discussed the patients code status:No: with: Electronically Signed:Ife Vitelli Virl Son, MD Beeper 06/30/2022, 3:05 PM

## 2022-06-30 NOTE — ED Notes
12:20 AM Assumed care of pt, alert and oriented, resting in hallway bed. No respiratory distress at this time.Tedford Berg, RN220am Pt sleeping on stretcher, equal and unlabored breathing.Ayden Hardwick, RN530am Pt is alert and verbal , equal and unlabored breathing , blood work drawn and sent to Ebony Hail, RN

## 2022-06-30 NOTE — Consults
Tel: [phone] / [phone] Fax:[phone]   Candy Sledge. Earle Gell, MDMurali Margretta Ditty, MD PhDMitchell H. Lorrine Kin, MDLarry Fisher, MDRobert F. Patricia Nettle, MDRam Lanelle Bal, MDMarc Florene Glen, MDSteve Lenetta Quaker, MDBryan Moshe Salisbury, MDRobert M. Cecile Hearing, Cawker [facility] Sol Passer, MDAri Elenora Fender, MDBrian Vernell Morgans, MD Aviva Signs, MDAdam Barton Fanny, MD, MScChirag Sherryll Burger, MDMatthew Seigerman, MDRichard L. Taikowski, MDEdward R. Shakala Marlatt, MDRobert D. Winslow, MDStephen R. Johna Roles, MDKathleen Crosspointe, APRNDana Power-Lewis, APRNMarie McCormick, APRNCharles Staples, APRNRenie Everardo Pacific, APRN 	Cardiology Consult Note History provided by: the patientHistory limited by: no limitationsConsult Information Consultation requested by:  No att. providers foundReason for consultation:  DyspneaHistory of Present Illness 66 year old gentleman with a history of amyloidosis who has chronic systolic/diastolic heart failure presents now with increasing dyspnea.  The dyspnea is worse with exertion and he has gained 5 pounds over the 2 days preceding admission.  He was at [facility] earlier this year with hypotension from over diuresis.  His torsemide is being given only if his weight increases but he has been traveling to [place] and back and was not so compliant with his medications.  He also has some complaints of chest pressure with ambulation and in the ER his D-dimer was elevated.  He went for a nuclear medicine lung V/Q scan.  The results of which are pending.Allergies  Allergies Allergen Reactions ? Penicillin G Swelling ? Atorvastatin  Medications Current Facility-Administered Medications: ?  dextrose (GLUCOSE) 40 % gel 15 g, 15 g, Oral, Q15 MIN PRN **OR** fruit juice 120 mL, 120 mL, Oral, Q15 MIN PRN **OR** skim milk 240 mL, 240 mL, Oral, Q15 MIN PRN, Afrin, Alta Corning, DO?  dextrose (GLUCOSE) 40 % gel 30 g, 30 g, Oral, Q15 MIN PRN **OR** fruit juice 240 mL, 240 mL, Oral, Q15 MIN PRN, Afrin, Alta Corning, DO?  dextrose 10% injection 125 mL, 12.5 g, Intravenous, Q15 MIN PRN, Afrin, Alta Corning, DO?  dextrose 10% injection 250 mL, 25 g, Intravenous, Q15 MIN PRN, Afrin, Alta Corning, DO?  diphenhydrAMINE (BENADRYL) capsule 25 mg, 25 mg, Oral, Q6H PRN, Afrin, Alta Corning, DO?  furosemide (LASIX) injection 20 mg, 20 mg, Intravenous, Q24H, Afrin, Syeda Farhana, DO, 20 mg at 06/30/22 0855?  gentamicin (GARAMYCIN) 0.3 % ophthalmic solution 1 drop, 1 drop, Both Eyes, TID, Afrin, Alta Corning, DO, 1 drop at 06/30/22 1342?  glucagon 1 mg in water for injection, sterile 1 mL (1 mg/mL), 1 mg, Intramuscular, Once PRN, Afrin, Alta Corning, DO?  insulin lispro (Admelog, HumaLOG) Sliding Scale (See admin instructions for dose) 1-18 Units, 1-18 Units, Subcutaneous, AC & HS, Afrin, Alta Corning, DO?  lactulose (CHRONULAC) solution 20 g, 20 g, Oral, TID PRN, Foye Spurling, MD, 20 g at 06/30/22 1222?  [Held by provider] magnesium oxide (MAG-OX) tablet 400 mg, 400 mg, Oral, Daily, Afrin, Alta Corning, DO?  midodrine (PROAMATINE) tablet 10 mg, 10 mg, Oral, TID WC, Afrin, Syeda Farhana, DO, 10 mg at 06/30/22 1150?  rivaroxaban (XARELTO) tablet Tab 20 mg, 20 mg, Oral, Daily (1700), Afrin, Alta Corning, DO?  rosuvastatin (CRESTOR) tablet 5 mg, 5 mg, Oral, Nightly, Afrin, Alta Corning, DO?  sodium chloride 0.9 % flush 3 mL, 3 mL, IV Push, Q8H, Afrin, Syeda Farhana, DO, 3 mL at 06/30/22 1342?  sodium chloride 0.9 % flush 3 mL, 3 mL, IV Push, PRN for Line Care, Afrin, Alta Corning, DO?  spironolactone (ALDACTONE) tablet 12.5 mg, 12.5 mg, Oral, Daily, Afrin, Syeda Farhana, DO, 12.5 mg at 06/30/22 1222 Past Medical History  Past Medical History: Diagnosis Date ? Arrhythmia  ?  Chronic coronary artery disease  ? Diabetes mellitus (HC Code)  ? Encounter for blood transfusion  ? GERD (gastroesophageal reflux disease)  ? Hypercholesteremia  ? Hypertension  ? Obstructive sleep apnea  ? Syncope, psychogenic  Past Surgical History: Procedure Laterality Date ? CERVICAL DISC ARTHROPLASTY   ? TOOTH EXTRACTION   ? VALVE REPLACEMENT   Family History  Family History Problem Relation Age of Onset ? Heart disease Mother  ? Diabetes Mother  ? Stroke Mother  ? Heart failure Mother  ? Heart disease Father  ? Heart attack Father  ? Diabetes Sister  ? Diabetes Brother  Social History  Social History Tobacco Use Smoking Status Former ? Current packs/day: 0.00 ? Types: Cigarettes Smokeless Tobacco Never Tobacco Comments  Quit early 1980's Social History Substance and Sexual Activity Alcohol Use Not Currently Occupational History ? Not on file Review of Systems CONSTITUTIONAL Negative for weight loss, fever/chills, night sweats. EYES: Negative for blurred or double vision. ENT: Negative for ringing, hoarseness or dysphagia. CARDIOVASCULAR: Negative for fatigue, chest pain, palpitations or claudication.  Positive for weight gain. RESPIRATORY Negative for cough, orthopnea, or hemoptysis.  Positive for dyspnea. GI Negative for anorexia, weight loss, indigestion, nausea, vomiting, constipation or diarrhea. GU: Negative for frequency, nocturia, or hematuria.  ENDOCRINE: Negative for diabetes, thyroid, and pituitary problems. MUSCULOSKELETAL: Negative for arthritis, gout, or bone pain. SKIN: Negative for itching, bruising, or rashes. NEUROLOGIC: Negative for headaches, dizziness, numbness, tingling, mental status changes. PSYCHIATRIC: Negative for anxiety or depression. HEMATOLOGIC: Negative for history of anemia or thrombocytopenia. Physical Exam Temp:  [97.2 ?F (36.2 ?C)-97.6 ?F (36.4 ?C)] 97.4 ?F (36.3 ?C)Pulse:  [56-73] 58Resp:  [16-20] 18BP: (104-132)/(70-83) 132/83SpO2:  [99 %-100 %] 100 %Device (Oxygen Therapy): room airNo intake or output data in the 24 hours ending 06/30/22 1523Temp:  [97.2 ?F (36.2 ?C)-97.6 ?F (36.4 ?C)] 97.4 ?F (36.3 ?C)Pulse:  [56-73] 58Resp:  [16-20] 18BP: (104-132)/(70-83) 132/83SpO2:  [99 %-100 %] 100 %   06/30/2022  10:00 AM 06/29/2022   3:28 PM 06/15/2022  11:49 AM Last 3 weights Weight (lb) 163 lb 2.3 oz 163 lb 2.3 oz 159 lb 3.2 oz Weight (kg) 74 kg 74 kg 72.213 kg Weight change:  Body mass index is 20.95 kg/m?Marland Kitchen General Appearance Pleasant.  No acute distress. Skin Warm and dry.  No generalized rashes.  HEENT PERRL, EOMI  O/P clear conjunctiva normal. No thyromegaly Neck JVP 10-12 cm. No carotid bruits. No cervical lymphadenopathy. No thyromegaly.  Lungs Clear to auscultation. Cardiac Regular rate and rhythm. Normal S1 and S2. 2/6 HSM at the apex. Nondisplaced PMI,No gallops.  Abdomen Active bowel sounds.  Soft and non-tender.  No hepatosplenomegaly.  No masses or bruits. Extremities No cyanosis or clubbing. No edema. Distal pulses are 1+/symmetric.  Neurologic  A&O x 3; Nonfocal.  Normal affect. Data Recent Labs Lab 09/06/231619 09/06/231958 09/06/232307 TROPTHS 80* 66* 63* HSTNT1HRDEL0  --  -14 -14 HSTNT3HRDEL0  --   --  -17 Recent Labs Lab 09/06/231619 09/07/230614 09/07/230616 09/07/231140 NA 134*  --  133*  --  K 4.5  --  4.4  --  CL 101  --  101  --  CO2 21  --  22  --  BUN 42*  --  39*  --  CREATININE 1.94*  --  1.43*  --  GLU 98 80 79 119* CALCIUM 9.9  --  9.6  --  Recent Labs Lab 09/06/231619 09/07/230616 WBC 4.4 3.4* HGB 13.1* 12.2* HCT 39.10 36.50* PLT 256 237 ECG:  NSR with short PR, PAC's and RBBBResults for orders placed or performed during the hospital encounter of 06/29/22 EKG Result Value Ref Range  Heart Rate 68 bpm  QRS Interval 150 ms  QT Interval 454 ms  QTC Interval 482 ms  P Axis 99 deg  QRS Axis 77 deg  T Wave Axis -19 deg  P-R Interval 104 msec  SEVERITY Abnormal ECG severity Echo:  Results for orders placed or performed during the hospital encounter of 02/22/22 Echo 2D Ltd w Doppler and CFI if Ind Image Enhancement and or 3D Result Value Ref Range  Reported Biplane EF% 38 %  Narrative  ~ * Limited Echocardiogram* Moderately decreased left ventricular systolic function. The left ventricular ejection fraction calculated by biplane Simpson's was 38%.  No thrombus visualized in the left ventricle. Moderately increased left ventricular cavity size.  Severe concentric left ventricular hypertrophy.  Flattened septum in systole consistent with right ventricular pressure overload.* Moderately increased right ventricular cavity size.  Mildly decreased right ventricular systolic function.  Estimated right ventricular systolic pressure is 44 mmHg compatible with pulmonary hypertension. The right ventricular systolic pressure may be underestimated.* A bioprosthetic aortic valve is present.  Spectral doppler was not performed across the aortic valve on this study.* Mild mitral regurgitation.* Severe tricuspid regurgitation.* Hepatic vein flow shows systolic flow reversal.  IVC diameter > 2.1 cm that collapses < 50% with a sniff suggests high RAP (10-20 mmHg, mean 15 mmHg).* No significant pericardial effusion.* No prior transthoracic study available for comparison. Stress Test:  No results found for this or any previous visit.Impression  66 year old gentleman history of amyloidosis and chronic combined heart failure presents now with worsening dyspnea while noncompliant with his medications and a 5 pound weight gain.  This most likely represents volume overload although a V/Q scan was ordered for an elevated D-dimer.  He is on Xarelto currently.  He should be diuresed with IV furosemide and his baseline congestive heart failure regimen should be continued.  He should likely be able to be discharged soon.Recommendations 1.  Diuresis with IV furosemide2.  Follow-up V/Q scan3.  Continue baseline CHF therapy4.  Ambulate and monitor saturationsSigned: Tawni Millers. Christean Silvestri, MD, FACC, FSCAI, FASECardiac Specialists/[phone]/7/20233:23 PM  [email or web address] in [place], [place], [place], [place], [place], [place], [place], [place]

## 2022-06-30 NOTE — ED Notes
9:25 PM 66 yo male presenting to ED with c/o SOB x 2 days. States difficulty breathing and chest pressure that increases with ambulation. Also reporting feeling lightheaded when walking, denies n/v/d, f/c. Pt alert, in no acute distress, airway patent, respirations equal and unlabored, skin warm and dry. Pending provider eval. Misty Stanley, RN

## 2022-06-30 NOTE — Plan of Care
Plan of Care Overview/ Patient Status    Problem: Adult Inpatient Plan of CareGoal: Plan of Care ReviewOutcome: Initial problem identification Per H&P, patient is a 66 yo male with pmh below here for shortness of breath for the past couple of days. Pt states dyspnea is worse with exertion, does feel like he has gained 5lb over the last 2 days , but has had cough with white sputum. Pt was discharged from [facility] in May of this year due to hypotension from over diuresis. Pt was advised to increase his Torsemide only he has increase in weight. Pt has been in [place] and has been loosely compliant with his meds.  Assessment screening completed. Continue to follow patient's progress and discuss plan of care with treatment team.  No discharge needs identified at this time. Negative Screening: Case Management department will follow patient's progress and discuss plan of care with treatment team.Care Management will continue to follow with team. - CM has discussed the observation status with the patient , next of kin, POA or conservator. Cm explained the patient is not admitted to the hospital. For a patient with traditional Medicare Part A and Part B insurance, this hospital visit is being billed to their Medicare Part B as an outpatient visit with observation services. Medicare Part B does not provide coverage for a nursing home stay and observation days do not count toward a 3 night qualifying stay at a nursing home. - For a patient with commercial managed Medicare insurance, this hospital visit may have a co-pay which can be determined by   contacting the insurance company.- For a patient with Medicaid insurance, there is no co-pay- The expected time to complete this out-patient work-up is between 24-48 hours. Case Management Screening  Flowsheet Row Most Recent Value Case Management Screening: Chart review completed. If YES to any question below then proceed to CM Eval/Plan  Is there a change in their cognitive function No Do you anticipate a change in this patient's physicial function that will effect discharge needs? No Has there been a readmission within the last 30 days No Were there services prior to admission ( Examples: Assisted Living, HD, Homecare, Extended Care Facility, Methadone, SNF, Outpatient Infusion Center) No Negative/Positive Screen Negative Screening: Case Management department will follow patient's progress and discuss plan of care with treatment team. Case Manager Attestation  I have reviewed the medical record and completed the above screen. CM staff will follow patient's progress and discuss the plan of care with the Treatment Team. Yes   Brennan Bailey MSN, RNCare CoordinationBridgeport HospitalPCU/[place] [phone]

## 2022-06-30 NOTE — ED Notes
7:56 AM Chief Complaint Patient presents with  Shortness of Breath   Sob  increasing  with walking      ,last bm   last  Sunday   Past Medical History: Diagnosis Date  Arrhythmia   Chronic coronary artery disease   Diabetes mellitus (HC Code)   Encounter for blood transfusion   GERD (gastroesophageal reflux disease)   Hypercholesteremia   Hypertension   Obstructive sleep apnea   Syncope, psychogenic  Temp:  [97.2 ?F (36.2 ?C)-97.6 ?F (36.4 ?C)] 97.6 ?F (36.4 ?C)Pulse:  [56-73] 60Resp:  [16-20] 18BP: (104-113)/(70-77) 104/71SpO2:  [99 %-100 %] 100 %Device (Oxygen Therapy): room airPt care assumed, pt currently lying in hall bed in no acute distress.  Denies any needs at this time.  Resp even and unlabored.  BS 79 per morning labs insulin held at this time.  Breakfast menu given and breakfast ordered.9:13 AMED to Floor HandoffAdmission Dx:     SOB         Telemetry: 	[]  Yes		[x]  NoOxygen Tank on Stretcher >1000 PSI:  []  YesCode Status:   [x]  Full		[]  Partial		[]  No CodeIsolation: 	[x]  None	[]  Contact	[]  Droplet	[]  AirborneSafety Precautions: [x]  None	[]  Sitter   []  Restraints	[]  Suicidal	[]  Fall Risk	Other (specify):           Mentation/Orientation:    A&O (person, place, time and situation) x     4     	 		Disoriented to:          Deficits: []  Hearing impaired	[]  Blind  	[]  Nonverbal		 []  ID/DD (intelectual disability/developmental disability)Ambulation: [x]  Ambulatory	[]  Non-Ambulatory  Diet: [x]  OK to eat	[]  NPO	Other (specify):           IV Access: [x]  Yes   []  No     Vital signs Charted in the last hour: [x] Yes    []  No         Other (specify)            Skin Alteration:[]  Pressure Injury	[]  Wound	[x]  None	[]  Skin Not AssessedDiarrhea/Loose stool : []  1x within 24h  []  2x within 24h  []  3x within 24h  [x]  None      C.Diff Order:  []  Ordered- needs to be collected    []  Collected-sent to lab    []  Resulted - Negative C.Diff  []  Resulted - Positive C.Diff    []  Not Ordered   	[x]  N/APatient Belongings:Does the patient have belongings going to the floor?   [x] Yes    []  NoAre the belongings documented?          [x] Yes    []  NoIs someone taking belongings home?   [] Yes    [x]  No   Who (specify):           _________ED RN and MHB #:     Feliz Beam              Please call the ED RN if you have any questions            You can always call the [facility] ED charge nurse at [phone] if you have any concerns [facility] ED [facility] Emergency Department

## 2022-06-30 NOTE — Utilization Review (ED)
Shortness of breath, on room air, CXR negative, troponins 63, 65, 80, BNP 1896, D-dimer 2.51, UR to followUM Status: Managed Medicare Obs

## 2022-06-30 NOTE — Plan of Care
Plan of Care Overview/ Patient Status    Admission Note Nursing ZIAH DEIS is a 66 y.o. male admitted with a chief complaint of shortness of breath. Patient arrived from [facility] ED. Patient is alert and oriented x4. Afebrile, VSS. Lung sounds diminished on RA. SOB noted. Patient stating that he has been experiencing SOB since this past weekend after being non-complaint with prescribed lasix. Cardiac diet, tolerating well. Abdomen is soft and non-tender with (+) BS. Cont x2, LBM 9/3 per patient. Complains of constipation, provider made aware and lactulose ordered. L and R PIV's C/D/I. Ambulates OOB independently and is independent with ADL's. Ventilation perfusion study completed during shift. Safety measures and call bell within reach. Problem: Adult Inpatient Plan of CareGoal: Plan of Care ReviewOutcome: Interventions implemented as appropriate Problem: Adult Inpatient Plan of CareGoal: Patient-Specific Goal (Individualized)Outcome: Interventions implemented as appropriate Vitals:  06/29/22 2234 06/30/22 0706 06/30/22 0958 06/30/22 1000 BP: 108/73 104/71 132/83  Pulse: 60  (!) 58  Resp: 18 18 18   Temp: 97.6 ?F (36.4 ?C) 97.6 ?F (36.4 ?C) 97.4 ?F (36.3 ?C)  TempSrc: Oral Oral Oral  SpO2: 99% 100% 100%  Weight:    74 kg Height:    6' 2 (1.88 m) Oxygen therapy Oxygen TherapySpO2: 100 %Device (Oxygen Therapy): room airI have reviewed the patient's current medication orders..I have reviewed patient valuables Belongings charted in last 7 days: Valuable(s) : Cell Phone; Wallet; Other (Comments) (Watch) (06/30/2022 10:00 AM) See flowsheets, patient education and plan of care for additional information.

## 2022-06-30 NOTE — Utilization Review (ED)
UM Status: UR Reviewed-Continue Observation Services VQ scan negative

## 2022-06-30 NOTE — Plan of Care
Patient Name:Frank Bradley					             MRN: [id number] Chaplain:  [id number]				                                      					 Religion: ChristianBridgeport HospitalSpiritual Care Note                  **Date of Spiritual Visit: 06/30/22          Start Time: 1555    Stop Time: 1615  Time Calculation (min): 20 min       Total Consult Time: 25 minutes  Patient shared his faith in God's interventions in his life and now. He reported having a negative experience overnight in the ED which the chaplain shared with Patient Relations. PR representative responded with personal visit immediately.Patient expressed his appreciation for consideration offered. Intervention:  Referral Source: Chaplain Initiated  Consulted With: Patient Relations     Intervention Type: Spiritual Visit Spiritual/Emotional/Religious Care Intervention(s) provided: Active Listening and or Reflection  Outcome: Patient/Loved ones' desires are respected; emotional spiritual, religious and/or existential needs are addressed.With the help of the Chaplain, Patient/Loved One(s): Established a relationship with the chaplain, Obtained more information or help from the health care team  Plan:    Follow-Up Visit Needed: No     06/30/2022 4:24 PM Plan of Care Overview/ Patient Status

## 2022-06-30 NOTE — ED Notes
9:46 PM Assumed care of 66 yo male who presented to ED with complaints of chest heaviness, SOB x2 days. Pt reports he recently traveled to [place] and was loosely compliant with his lasix. Pt endorses SOB with exertion, relieved at rest. Pt denies chest heaviness at this time. Pt does report a 5 pound weight gain in last two days. Respirations equal and unlabored, speaking in clear coherent sentences. Pending provider eval

## 2022-06-30 NOTE — H&P
[facility]	 Medicine History & Physical History provided by: the patient and EMR reviewHistory limited by: no limitationsPatient presents from: HomeSubjective: Chief Complaint: shortness of breathHPI 66 yo male with pmh below here for shortness of breath for the past couple of days. Pt states dyspnea is worse with exertion, does feel like he has gained 5lb over the last 2 days , but has had cough with white sputum. Pt was discharged from [facility] in May of this year due to hypotension from over diuresis. Pt was advised to increase his Torsemide only he has increase in weight. Pt has been in [place] and has been loosely compliant with his meds. . in addition complains of itching in his back, chest pressure that increases with ambulation. In er pt found to have elevated D-dimer and mild increase in renal functions. Medical History: PMH PSH Past Medical History: Diagnosis Date ? Arrhythmia  ? Chronic coronary artery disease  ? Diabetes mellitus (HC Code)  ? Encounter for blood transfusion  ? GERD (gastroesophageal reflux disease)  ? Hypercholesteremia  ? Hypertension  ? Obstructive sleep apnea  ? Syncope, psychogenic  amyloidosis  Past Surgical History: Procedure Laterality Date ? CERVICAL DISC ARTHROPLASTY   ? TOOTH EXTRACTION   ? VALVE REPLACEMENT     Family History family history includes Diabetes in his brother, mother, and sister; Heart attack in his father; Heart disease in his father and mother; Heart failure in his mother; Stroke in his mother.Social History  reports that he has quit smoking. His smoking use included cigarettes. He has never used smokeless tobacco. He reports that he does not currently use alcohol. He reports that he does not use drugs.Prior to Admission Medications (Not in a hospital admission) Allergies Allergies Allergen Reactions ? Penicillin G Swelling ? Atorvastatin   Review of Systems: Review of Systems Constitutional: Positive for activity change and unexpected weight change. HENT: Negative.  Eyes: Negative.  Respiratory: Positive for cough, chest tightness and shortness of breath.  Cardiovascular: Positive for chest pain. Gastrointestinal: Negative.  Endocrine: Negative.  Genitourinary: Negative.  Musculoskeletal: Negative.  Skin:      Itching in back  Allergic/Immunologic: Negative.  Neurological: Negative.  Hematological: Negative.  Psychiatric/Behavioral: Negative.   Objective: Vitals:Last 24 hours: Temp:  [97.2 ?F (36.2 ?C)-97.6 ?F (36.4 ?C)] 97.6 ?F (36.4 ?C)Pulse:  [56-73] 60Resp:  [16-20] 18BP: (104-113)/(70-77) 108/73SpO2:  [99 %-100 %] 99 %Physical Exam: Physical ExamConstitutional:     Comments: Elderly  HENT:    Head: Normocephalic and atraumatic.    Right Ear: Tympanic membrane, ear canal and external ear normal.    Left Ear: Tympanic membrane, ear canal and external ear normal.    Nose: Nose normal.    Mouth/Throat:    Pharynx: Oropharynx is clear. Eyes:    General:       Right eye: Discharge present. Cardiovascular:    Rate and Rhythm: Bradycardia present. Pulmonary:    Effort: Pulmonary effort is normal. Abdominal:    General: Bowel sounds are normal. There is no distension.    Palpations: There is no mass.    Tenderness: There is no abdominal tenderness. There is no right CVA tenderness, left CVA tenderness, guarding or rebound.    Hernia: No hernia is present. Musculoskeletal:       General: Normal range of motion.    Cervical back: Normal range of motion. Skin:   General: Skin is warm. Neurological:    General: No focal deficit present.    Mental Status: He is alert  and oriented to person, place, and time. Mental status is at baseline. Psychiatric:       Mood and Affect: Mood normal.       Behavior: Behavior normal.       Thought Content: Thought content normal.       Judgment: Judgment normal.  Labs: Last 24 hours: Recent Results (from the past 24 hour(s)) EKG  Collection Time: 06/29/22  3:45 PM Result Value Ref Range  Heart Rate 68 bpm  QRS Interval 150 ms  QT Interval 454 ms  QTC Interval 482 ms  P Axis 99 deg  QRS Axis 77 deg  T Wave Axis -19 deg  P-R Interval 104 msec  SEVERITY Abnormal ECG severity Mag  Collection Time: 06/29/22  4:19 PM Result Value Ref Range  Magnesium 2.5 (H) 1.7 - 2.4 mg/dL Troponin T High Sensitivity, Emergency; 0 hour baseline AND 1 hour with reflex (3 hour)  Collection Time: 06/29/22  4:19 PM Result Value Ref Range  High Sensitivity Troponin T 80 (H) See Comment ng/L NT-proBrain natriuretic peptide  Collection Time: 06/29/22  4:19 PM Result Value Ref Range  NT-proBNP 1,896.0 (H) <125.0 pg/mL CBC auto differential  Collection Time: 06/29/22  4:19 PM Result Value Ref Range  WBC 4.4 4.0 - 11.0 x1000/?L  RBC 4.61 4.00 - 6.00 M/?L  Hemoglobin 13.1 (L) 13.2 - 17.1 g/dL  Hematocrit 16.10 96.04 - 50.00 %  MCV 84.8 80.0 - 100.0 fL  MCH 28.4 27.0 - 33.0 pg  MCHC 33.5 31.0 - 36.0 g/dL  RDW-CV 54.0 (H) 98.1 - 15.0 %  Platelets 256 150 - 420 x1000/?L  MPV 9.9 8.0 - 12.0 fL  Neutrophils 64.1 39.0 - 72.0 %  Lymphocytes 26.1 17.0 - 50.0 %  Monocytes 8.0 4.0 - 12.0 %  Eosinophils 0.5 0.0 - 5.0 %  Basophil 1.1 0.0 - 1.4 %  Immature Granulocytes 0.2 0.0 - 1.0 %  nRBC 0.0 0.0 - 1.0 %  ANC(Abs Neutrophil Count) 2.79 2.00 - 7.60 x 1000/?L  Absolute Lymphocyte Count 1.14 0.60 - 3.70 x 1000/?L  Monocyte Absolute Count 0.35 0.00 - 1.00 x 1000/?L  Eosinophil Absolute Count 0.02 0.00 - 1.00 x 1000/?L  Basophil Absolute Count 0.05 0.00 - 1.00 x 1000/?L  Absolute Immature Granulocyte Count 0.01 0.00 - 0.30 x 1000/?L  Absolute nRBC 0.00 0.00 - 1.00 x 1000/?L Comprehensive metabolic panel  Collection Time: 06/29/22  4:19 PM Result Value Ref Range  Sodium 134 (L) 136 - 144 mmol/L  Potassium 4.5 3.3 - 5.3 mmol/L  Chloride 101 98 - 107 mmol/L  CO2 21 20 - 30 mmol/L  Anion Gap 12 7 - 17  Glucose 98 70 - 100 mg/dL  BUN 42 (H) 8 - 23 mg/dL  Creatinine 1.91 (H) 4.78 - 1.30 mg/dL  Calcium 9.9 8.8 - 29.5 mg/dL  BUN/Creatinine Ratio 62.1 8.0 - 23.0  Total Protein 8.3 6.6 - 8.7 g/dL  Albumin 4.2 3.6 - 4.9 g/dL  Total Bilirubin 1.1 <=3.0 mg/dL  Alkaline Phosphatase 865 (H) 9 - 122 U/L  Alanine Aminotransferase (ALT) 11 9 - 59 U/L  Aspartate Aminotransferase (AST) 41 (H) 10 - 35 U/L  Globulin 4.1 (H) 2.3 - 3.5 g/dL  A/G Ratio 1.0 1.0 - 2.2  AST/ALT Ratio 3.7 Reference Range Not Established  eGFR (Creatinine) 37 (L) >=60 mL/min/1.48m2 Troponin T High Sensitivity, 1 Hour With Reflex (BH GH LMW YH)  Collection Time: 06/29/22  7:58 PM Result Value Ref Range  High Sensitivity Troponin T 66 (  H) See Comment ng/L  1 hour Delta from 0 Hour, HS-Troponin T -14 ng/L Troponin T High Sensitivity, 3 Hour (BH GH LMW YH)  Collection Time: 06/29/22 11:07 PM Result Value Ref Range  High Sensitivity Troponin T 63 (H) See Comment ng/L  1 hour Delta from 0 Hour, HS-Troponin T -14 ng/L  3 hour Delta from 0 Hour, HS-Troponin T -17 ng/L D-dimer, quantitative  Collection Time: 06/29/22 11:07 PM Result Value Ref Range  D-Dimer 2.51 (H) <=0.68 mg/L FEU Reviewed by me. Diagnostics:XR Chest PA or AP [[id number]] Collected: 06/29/22 1926 Order Status: Completed Updated: 06/29/22 1929 Narrative: ? EXAM: XR CHEST PA OR AP INDICATION: sob COMPARISON: XR CHEST PA OR AP 2022-02-23 FINDINGS: Support Devices: None Heart/Mediastinum: Unchanged. Lungs/Pleura: No focal consolidation. Chronic blunting at the right costophrenic angle. Bones: No acute findings. Impression: ? No acute findings. [facility] Radiology Notify System Classification: ?Routine Reported and signed by: [name], MD  RadiologyReviewed by me. ECG/Tele Events: I have reviewed the patient's ECG as resulted in the EMR.Assessment: Principal Problem:  Shortness of breath  SNOMED [place](R): DYSPNEA  Plan: Will place under observation to monitored bed. 1. Dyspnea: could be due to fluid overload, will start on iv lasix, daily weight, strict I/O's, cardio eval. Consider getting VQ scan. 2. Resume home meds. 3. Right eye recent surgery continue eye drops. 4. On Xarelto for dvt prophylaxis. I have discussed the patients code status:No: with:Notifications: PCP: [name] [phone]    I have personally notified the patient's primary care provider of this admission. Yes  Family was notified of this admission. NoI have personally discussed the plan with the patient and/or family. YesI have reviewed the plan of care with the bedside nurse, including an emphasis on the most important aspects of care for the next 12 hours: yesAdvance Care Planning Advance Care Planning Patient has a living will: noPatient has healthcare representative/proxy: [email or web address] enter code [phone] if patient answers Yes or code [phone] if patient answers No to any of the above questions and you are not completing a 30 minute or more Advanced Care Planning encounter. Electronically Signed:Alcee Sipos Fidel Levy, DO 06/30/2022, 1:20 AMAddendum: None

## 2022-06-30 NOTE — Utilization Review (ED)
UM Status: Clinical Review Initiated/Requires Completion CHF mgt . If plan is to continue with IV lasix. Will ask for IP update. Corky Sox, RN, MSN, ACMUtilization Review RN , [facility] : [phone]

## 2022-06-30 NOTE — Plan of Care
Plan of Care Overview/ Patient Status    Frank Bradley was discharged accompanied alone.  Verbalized understanding of discharge instructionsand recommended follow up care as per the after visit summary.  Written discharge instructions provided. Denies any further questions. Vital signs    Vitals:  06/29/22 2234 06/30/22 0706 06/30/22 0958 06/30/22 1000 BP: 108/73 104/71 132/83  Pulse: 60  (!) 58  Resp: 18 18 18   Temp: 97.6 ?F (36.4 ?C) 97.6 ?F (36.4 ?C) 97.4 ?F (36.3 ?C)  TempSrc: Oral Oral Oral  SpO2: 99% 100% 100%  Weight:    74 kg Height:    6' 2 (1.88 m) Patient confirmed all belongings returned. Belongings charted in last 7 days: Valuable(s) : Cell Phone; Wallet; Other (Comments) (Watch) (06/30/2022 10:00 AM)

## 2022-07-01 ENCOUNTER — Encounter: Admit: 2022-07-01 | Payer: PRIVATE HEALTH INSURANCE | Primary: Internal Medicine

## 2022-07-01 ENCOUNTER — Encounter: Admit: 2022-07-01 | Payer: PRIVATE HEALTH INSURANCE | Attending: School | Primary: Internal Medicine

## 2022-07-01 NOTE — Progress Notes
[facility] Care Navigation:  Transition of Care Note Care Navigation spoke with: PatientDischarging [facility]: [facility]23Hospital Discharge Date: 06/30/2022    Diagnosis: Shortness of breathDischarge location: HomeHOSPITALIZATION:Reason patient admitted: 67 yo male with pmh below here for shortness of breath for the past couple of days. Pt states dyspnea is worse with exertion, does feel like he has gained 5lb over the last 2 days , but has had cough with white sputum. Pt was discharged from [facility] in May of this year due to hypotension from over diuresis. Pt was advised to increase his Torsemide only he has increase in weight. Pt has been in [place] and has been loosely compliant with his meds. . in addition complains of itching in his back, chest pressure that increases with ambulation. In er pt found to have elevated D-dimer and mild increase in renal functionsDiagnosis at discharge: Shortness of breathCURRENT STATE:Since discharge patient reports feeling: SameNew symptoms reported include: Breathing is good. Patient states he has weighed himself on and off but that he will start with his daily weights again.    REVIEW OF AFTER VISIT SUMMARY DOCUMENT: MEDICATION CHANGES:Validated NEW medications to take: N/AValidated Changed medications to take: N/AValidated Stopped medications to NOT take: YesIssues obtaining prescriptions: NoAdditional medication related notes: Patient has no questions about medications. We went over when to take torsemide tablets. Verbalized understanding. FOLLOW-UP APPOINTMENTS and TRANSPORTATION:Patient aware of scheduled appointments: YesAwareness and assistance with appointments needing to be scheduled: NoTransportation concerns for follow-up appointment: NoPatient drivesDME and HOME HEALTH SERVICES:Durable medical equipment received: N/AContact has been made with home care agency: N/APlan established for follow up labs/tests: N/A Post discharge goals currently met. No follow up phone call needed at this time.Maureen Ralphs, RN [employer]) [phone]

## 2022-07-03 NOTE — ED Provider Notes
Chief Complaint Patient presents with ? Shortness of Breath   Sob  increasing  with walking      ,last bm   last  Sunday   HPI/PE:This is a 66 year old gentleman with history of CHF, hypertension, amyloidosis who presents with shortness of breath, dyspnea on exertion for the last couple of days.  The patient denies any chest pain and has no leg pain or swelling.  He has a productive cough with white sputum.  He denies any abdominal pain nausea vomiting or diarrhea.MDM:DDX CHF, Pneumonia, PE, MIPlan: Labs, EKG, CXRAn acute or life threatening problem was considered during this evaluation  A decision regarding hospitalization was made during this visit  External data reviewed: Labs, Radiology, ECG and Notes (OSH or non-ED)Independent interpretation of: ECG and XRayDirectly spoke with:  Hospitalist  Physical ExamED Triage Vitals [06/29/22 1526]BP: 113/77Pulse: 73Pulse from  O2 sat: n/aResp: 20Temp: 97.2 ?F (36.2 ?C)Temp src: TemporalSpO2: 100 % BP 108/73  - Pulse 60  - Temp 97.6 ?F (36.4 ?C) (Oral)  - Resp 18  - Wt 74 kg (163 lb 2.3 oz)  - SpO2 99%  - BMI 20.95 kg/m? Physical ExamVitals and nursing note reviewed. Constitutional:     General: He is not in acute distress.   Appearance: He is well-developed. HENT:    Head: Normocephalic and atraumatic.    Right Ear: External ear normal.    Left Ear: External ear normal.    Nose: Nose normal.    Mouth/Throat:    Pharynx: No oropharyngeal exudate. Eyes:    Conjunctiva/sclera: Conjunctivae normal.    Pupils: Pupils are equal, round, and reactive to light. Neck:    Thyroid: No thyromegaly.    Vascular: No JVD.    Trachea: No tracheal deviation. Cardiovascular:    Rate and Rhythm: Normal rate and regular rhythm.    Heart sounds: Normal heart sounds. No murmur heard.   No friction rub. No gallop. Pulmonary:    Effort: Pulmonary effort is normal. No respiratory distress.    Breath sounds: Normal breath sounds. No wheezing or rales. Chest:    Chest wall: No tenderness. Abdominal:    General: Bowel sounds are normal. There is no distension.    Palpations: Abdomen is soft. There is no mass.    Tenderness: There is no abdominal tenderness. There is no guarding or rebound. Musculoskeletal:       General: No tenderness or deformity. Normal range of motion.    Cervical back: Normal range of motion and neck supple. Lymphadenopathy:    Cervical: No cervical adenopathy. Skin:   General: Skin is warm and dry.    Coloration: Skin is not pale.    Findings: No erythema or rash. Neurological:    Mental Status: He is alert and oriented to person, place, and time.    Cranial Nerves: No cranial nerve deficit.    Motor: No abnormal muscle tone.    Coordination: Coordination normal. Psychiatric:       Behavior: Behavior normal.       Thought Content: Thought content normal.       Judgment: Judgment normal.  ProceduresAttestation/Critical CarePatient Reevaluation: This is a very pleasant 66 year old gentleman who is complaining of shortness of breath.  The patient is on Torsemide and has instructions from his physician that if he has weight gain to increase his dosage as directed.  The patient is compliant with his regimen of medications including medications for his amyloidosis.  Blood work is significant for an elevated D-dimer as I  cannot rule out pulmonary embolism at this time and the patient has an AKI and cannot get a CTA chest to rule out the he be admitted for re-evaluation and possible V/Q scan if needed.  The patient has an elevated BNP however not much more elevated compared to previous.  Does have elevated troponins with a negative delta.  An EKG shows a normal sinus rhythm with right bundle branch block similar to previous.  The patient will be admitted to the hospitalist service.Patient progress: stableClinical Impressions as of 07/02/22 2353 Shortness of breath Elevated d-dimer  ED DispositionNo disposition selected since last refresh of note. Avis Epley, DO09/09/23 2353

## 2022-07-05 ENCOUNTER — Other Ambulatory Visit (HOSPITAL_COMMUNITY): Payer: Self-pay

## 2022-07-05 ENCOUNTER — Emergency Department: Admit: 2022-07-05 | Payer: PRIVATE HEALTH INSURANCE | Primary: Internal Medicine

## 2022-07-05 ENCOUNTER — Encounter: Admit: 2022-07-05 | Payer: PRIVATE HEALTH INSURANCE | Primary: Internal Medicine

## 2022-07-05 ENCOUNTER — Inpatient Hospital Stay
Admit: 2022-07-05 | Discharge: 2022-07-06 | Payer: PRIVATE HEALTH INSURANCE | Attending: Internal Medicine | Primary: Internal Medicine

## 2022-07-05 DIAGNOSIS — E119 Type 2 diabetes mellitus without complications: Secondary | ICD-10-CM

## 2022-07-05 DIAGNOSIS — K219 Gastro-esophageal reflux disease without esophagitis: Secondary | ICD-10-CM

## 2022-07-05 DIAGNOSIS — I10 Essential (primary) hypertension: Secondary | ICD-10-CM

## 2022-07-05 DIAGNOSIS — E7800 Pure hypercholesterolemia, unspecified: Secondary | ICD-10-CM

## 2022-07-05 DIAGNOSIS — G4733 Obstructive sleep apnea (adult) (pediatric): Secondary | ICD-10-CM

## 2022-07-05 DIAGNOSIS — R079 Chest pain, unspecified: Secondary | ICD-10-CM

## 2022-07-05 DIAGNOSIS — I2510 Atherosclerotic heart disease of native coronary artery without angina pectoris: Secondary | ICD-10-CM

## 2022-07-05 DIAGNOSIS — F488 Other specified nonpsychotic mental disorders: Secondary | ICD-10-CM

## 2022-07-05 DIAGNOSIS — I499 Cardiac arrhythmia, unspecified: Secondary | ICD-10-CM

## 2022-07-05 DIAGNOSIS — Z5189 Encounter for other specified aftercare: Secondary | ICD-10-CM

## 2022-07-05 MED ORDER — DIPHENHYDRAMINE 25 MG CAPSULE
25 mg | Freq: Once | ORAL | Status: CP
Start: 2022-07-05 — End: ?
  Administered 2022-07-06: 02:00:00 25 mg via ORAL

## 2022-07-05 MED ORDER — ACETAMINOPHEN 325 MG TABLET
325 mg | Freq: Once | ORAL | Status: CP
Start: 2022-07-05 — End: ?
  Administered 2022-07-06: 02:00:00 325 mg via ORAL

## 2022-07-05 MED ORDER — ASPIRIN 81 MG CHEWABLE TABLET
81 mg | Freq: Once | ORAL | Status: CP
Start: 2022-07-05 — End: ?
  Administered 2022-07-06: 01:00:00 81 mg via ORAL

## 2022-07-06 ENCOUNTER — Other Ambulatory Visit (HOSPITAL_COMMUNITY): Payer: Self-pay

## 2022-07-06 DIAGNOSIS — Z7984 Long term (current) use of oral hypoglycemic drugs: Secondary | ICD-10-CM

## 2022-07-06 DIAGNOSIS — I483 Typical atrial flutter: Secondary | ICD-10-CM

## 2022-07-06 DIAGNOSIS — Z888 Allergy status to other drugs, medicaments and biological substances status: Secondary | ICD-10-CM

## 2022-07-06 DIAGNOSIS — M7918 Myalgia, other site: Secondary | ICD-10-CM

## 2022-07-06 DIAGNOSIS — Z880 Allergy status to penicillin: Secondary | ICD-10-CM

## 2022-07-06 DIAGNOSIS — E1122 Type 2 diabetes mellitus with diabetic chronic kidney disease: Secondary | ICD-10-CM

## 2022-07-06 DIAGNOSIS — Z953 Presence of xenogenic heart valve: Secondary | ICD-10-CM

## 2022-07-06 DIAGNOSIS — I5023 Acute on chronic systolic (congestive) heart failure: Secondary | ICD-10-CM

## 2022-07-06 DIAGNOSIS — R072 Precordial pain: Principal | ICD-10-CM

## 2022-07-06 DIAGNOSIS — E7800 Pure hypercholesterolemia, unspecified: Secondary | ICD-10-CM

## 2022-07-06 DIAGNOSIS — I2510 Atherosclerotic heart disease of native coronary artery without angina pectoris: Secondary | ICD-10-CM

## 2022-07-06 DIAGNOSIS — Z87891 Personal history of nicotine dependence: Secondary | ICD-10-CM

## 2022-07-06 DIAGNOSIS — R778 Other specified abnormalities of plasma proteins: Secondary | ICD-10-CM

## 2022-07-06 DIAGNOSIS — Z7901 Long term (current) use of anticoagulants: Secondary | ICD-10-CM

## 2022-07-06 DIAGNOSIS — Z79899 Other long term (current) drug therapy: Secondary | ICD-10-CM

## 2022-07-06 DIAGNOSIS — N183 Chronic kidney disease, stage 3 unspecified: Secondary | ICD-10-CM

## 2022-07-06 DIAGNOSIS — Z951 Presence of aortocoronary bypass graft: Secondary | ICD-10-CM

## 2022-07-06 DIAGNOSIS — I480 Paroxysmal atrial fibrillation: Secondary | ICD-10-CM

## 2022-07-06 DIAGNOSIS — I130 Hypertensive heart and chronic kidney disease with heart failure and stage 1 through stage 4 chronic kidney disease, or unspecified chronic kidney disease: Secondary | ICD-10-CM

## 2022-07-06 LAB — CBC WITH AUTO DIFFERENTIAL
BKR GLUCOSE, UA: 0.05 x 1000/??L (ref 0.00–1.00)
BKR WAM ABSOLUTE IMMATURE GRANULOCYTES.: 0.01 x 1000/ÂµL (ref 0.00–0.30)
BKR WAM ABSOLUTE IMMATURE GRANULOCYTES.: 0.01 x 1000/ÂµL (ref 0.00–0.30)
BKR WAM ABSOLUTE LYMPHOCYTE COUNT.: 1.86 x 1000/ÂµL (ref 0.60–3.70)
BKR WAM ABSOLUTE LYMPHOCYTE COUNT.: 1.52 x 1000/ÂµL (ref 0.60–3.70)
BKR WAM ABSOLUTE NRBC (2 DEC): 0 x 1000/ÂµL (ref 0.00–1.00)
BKR WAM ABSOLUTE NRBC (2 DEC): 0 x 1000/ÂµL (ref 0.00–1.00)
BKR WAM ANALYZER ANC: 2.91 x 1000/ÂµL (ref 2.00–7.60)
BKR WAM ANALYZER ANC: 2.24 x 1000/ÂµL (ref 2.00–7.60)
BKR WAM BASOPHIL ABSOLUTE COUNT.: 0.05 x 1000/ÂµL (ref 0.00–1.00)
BKR WAM BASOPHIL ABSOLUTE COUNT.: 0.04 x 1000/ÂµL (ref 0.00–1.00)
BKR WAM BASOPHILS: 0.9 % (ref 0.0–1.4)
BKR WAM BASOPHILS: 0.9 % (ref 0.0–1.4)
BKR WAM EOSINOPHIL ABSOLUTE COUNT.: 0.02 x 1000/ÂµL (ref 0.00–1.00)
BKR WAM EOSINOPHIL ABSOLUTE COUNT.: 0.04 x 1000/ÂµL (ref 0.00–1.00)
BKR WAM EOSINOPHILS: 0.4 % (ref 0.0–5.0)
BKR WAM EOSINOPHILS: 0.9 % (ref 0.0–5.0)
BKR WAM HEMATOCRIT (2 DEC): 41.5 % (ref 38.50–50.00)
BKR WAM HEMATOCRIT (2 DEC): 37.1 % — ABNORMAL LOW (ref 38.50–50.00)
BKR WAM HEMOGLOBIN: 13.7 g/dL (ref 13.2–17.1)
BKR WAM HEMOGLOBIN: 12.1 g/dL — ABNORMAL LOW (ref 13.2–17.1)
BKR WAM IMMATURE GRANULOCYTES: 0.2 % (ref 0.0–1.0)
BKR WAM IMMATURE GRANULOCYTES: 0.2 % (ref 0.0–1.0)
BKR WAM LYMPHOCYTES: 34.6 % (ref 17.0–50.0)
BKR WAM LYMPHOCYTES: 34.2 % (ref 17.0–50.0)
BKR WAM MCH (PG): 28 pg (ref 27.0–33.0)
BKR WAM MCH (PG): 27.8 pg (ref 27.0–33.0)
BKR WAM MCHC: 33 g/dL (ref 31.0–36.0)
BKR WAM MCHC: 32.6 g/dL (ref 31.0–36.0)
BKR WAM MCV: 84.9 fL (ref 80.0–100.0)
BKR WAM MCV: 85.1 fL (ref 80.0–100.0)
BKR WAM MONOCYTE ABSOLUTE COUNT.: 0.52 x 1000/ÂµL (ref 0.00–1.00)
BKR WAM MONOCYTE ABSOLUTE COUNT.: 0.59 x 1000/ÂµL (ref 0.00–1.00)
BKR WAM MONOCYTES: 9.7 % (ref 4.0–12.0)
BKR WAM MONOCYTES: 13.3 % — ABNORMAL HIGH (ref 4.0–12.0)
BKR WAM MPV: 9.5 fL (ref 8.0–12.0)
BKR WAM MPV: 9.9 fL (ref 8.0–12.0)
BKR WAM NEUTROPHILS: 54.2 % (ref 39.0–72.0)
BKR WAM NEUTROPHILS: 50.5 % (ref 39.0–72.0)
BKR WAM NUCLEATED RED BLOOD CELLS: 0 % (ref 0.0–1.0)
BKR WAM NUCLEATED RED BLOOD CELLS: 0 % (ref 0.0–1.0)
BKR WAM PLATELETS: 282 x1000/ÂµL (ref 150–420)
BKR WAM PLATELETS: 239 x1000/ÂµL (ref 150–420)
BKR WAM RDW-CV: 15.9 % — ABNORMAL HIGH (ref 11.0–15.0)
BKR WAM RDW-CV: 15.6 % — ABNORMAL HIGH (ref 11.0–15.0)
BKR WAM RED BLOOD CELL COUNT.: 4.89 M/ÂµL (ref 4.00–6.00)
BKR WAM RED BLOOD CELL COUNT.: 4.36 M/ÂµL (ref 4.00–6.00)
BKR WAM WHITE BLOOD CELL COUNT: 5.4 x1000/ÂµL (ref 4.0–11.0)
BKR WAM WHITE BLOOD CELL COUNT: 4.4 x1000/ÂµL (ref 4.0–11.0)

## 2022-07-06 LAB — COMPREHENSIVE METABOLIC PANEL
BKR A/G RATIO: 0.9 — ABNORMAL LOW (ref 1.0–2.2)
BKR A/G RATIO: 0.9 — ABNORMAL LOW (ref 1.0–2.2)
BKR ALANINE AMINOTRANSFERASE (ALT): 8 U/L — ABNORMAL LOW (ref 9–59)
BKR ALANINE AMINOTRANSFERASE (ALT): 8 U/L — ABNORMAL LOW (ref 9–59)
BKR ALBUMIN: 3.9 g/dL (ref 3.6–4.9)
BKR ALBUMIN: 3.6 g/dL (ref 3.6–4.9)
BKR ALKALINE PHOSPHATASE: 113 U/L (ref 9–122)
BKR ALKALINE PHOSPHATASE: 106 U/L (ref 9–122)
BKR ANION GAP: 10 (ref 7–17)
BKR ANION GAP: 9 (ref 7–17)
BKR ASPARTATE AMINOTRANSFERASE (AST): 44 U/L — ABNORMAL HIGH (ref 10–35)
BKR ASPARTATE AMINOTRANSFERASE (AST): 34 U/L (ref 10–35)
BKR AST/ALT RATIO: 5.5
BKR AST/ALT RATIO: 4.3
BKR BILIRUBIN TOTAL: 0.8 mg/dL (ref <=1.2)
BKR BILIRUBIN TOTAL: 0.8 mg/dL (ref <=1.2)
BKR BLOOD UREA NITROGEN: 44 mg/dL — ABNORMAL HIGH (ref 8–23)
BKR BLOOD UREA NITROGEN: 49 mg/dL — ABNORMAL HIGH (ref 8–23)
BKR BUN / CREAT RATIO: 25.7 — ABNORMAL HIGH (ref 8.0–23.0)
BKR BUN / CREAT RATIO: 27.5 — ABNORMAL HIGH (ref 8.0–23.0)
BKR CALCIUM: 9.2 mg/dL (ref 8.8–10.2)
BKR CALCIUM: 9.3 mg/dL (ref 8.8–10.2)
BKR CHLORIDE: 98 mmol/L (ref 98–107)
BKR CHLORIDE: 98 mmol/L (ref 98–107)
BKR CO2: 23 mmol/L (ref 20–30)
BKR CO2: 22 mmol/L (ref 20–30)
BKR CREATININE: 1.71 mg/dL — ABNORMAL HIGH (ref 0.40–1.30)
BKR CREATININE: 1.78 mg/dL — ABNORMAL HIGH (ref 0.40–1.30)
BKR EGFR, CREATININE (CKD-EPI 2021): 43 mL/min/{1.73_m2} — ABNORMAL LOW (ref >=60)
BKR EGFR, CREATININE (CKD-EPI 2021): 41 mL/min/{1.73_m2} — ABNORMAL LOW (ref >=60)
BKR GLOBULIN: 4.2 g/dL — ABNORMAL HIGH (ref 2.3–3.5)
BKR GLOBULIN: 4 g/dL — ABNORMAL HIGH (ref 2.3–3.5)
BKR GLUCOSE: 119 mg/dL — ABNORMAL HIGH (ref 70–100)
BKR GLUCOSE: 96 mg/dL (ref 70–100)
BKR POTASSIUM: 4.6 mmol/L (ref 3.3–5.3)
BKR POTASSIUM: 4.1 mmol/L (ref 3.3–5.3)
BKR PROTEIN TOTAL: 8.1 g/dL (ref 6.6–8.7)
BKR PROTEIN TOTAL: 7.6 g/dL (ref 6.6–8.7)
BKR SODIUM: 131 mmol/L — ABNORMAL LOW (ref 136–144)
BKR SODIUM: 129 mmol/L — ABNORMAL LOW (ref 136–144)

## 2022-07-06 LAB — BASIC METABOLIC PANEL
BKR ANION GAP: 8 (ref 7–17)
BKR BLOOD UREA NITROGEN: 45 mg/dL — ABNORMAL HIGH (ref 8–23)
BKR BUN / CREAT RATIO: 25.1 — ABNORMAL HIGH (ref 8.0–23.0)
BKR CALCIUM: 9.3 mg/dL (ref 8.8–10.2)
BKR CHLORIDE: 89 mmol/L — ABNORMAL LOW (ref 98–107)
BKR CO2: 24 mmol/L (ref 20–30)
BKR CREATININE: 1.79 mg/dL — ABNORMAL HIGH (ref 0.40–1.30)
BKR EGFR, CREATININE (CKD-EPI 2021): 41 mL/min/{1.73_m2} — ABNORMAL LOW (ref >=60)
BKR GLUCOSE: 148 mg/dL — ABNORMAL HIGH (ref 70–100)
BKR SODIUM: 121 mmol/L — ABNORMAL LOW (ref 136–144)

## 2022-07-06 LAB — URINE DRUG SCREEN W/ NO CONF   (BH GH LMH YH)
BKR AMPHETAMINE SCREEN, URINE, NO CONF.: NEGATIVE
BKR BARBITURATE SCREEN, URINE, NO CONF.: NEGATIVE
BKR BENZODIAZEPINES SCREEN, URINE, NO CONF.: NEGATIVE
BKR CANNABINOIDS SCREEN, URINE, NO CONF.: NEGATIVE
BKR COCAINE SCREEN, URINE, NO CONF.: NEGATIVE
BKR METHADONE METABOLITE SCREEN, URINE, NO CONF.: NEGATIVE
BKR OPIATES SCREEN, URINE, NO CONF.: NEGATIVE
BKR OXYCODONE SCREEN, URINE, NO CONF.: NEGATIVE
BKR PHENCYCLIDINE (PCP) SCREEN, URINE, NO CONF.: NEGATIVE

## 2022-07-06 LAB — MAGNESIUM
BKR MAGNESIUM: 2.5 mg/dL — ABNORMAL HIGH (ref 1.7–2.4)
BKR MAGNESIUM: 2.3 mg/dL (ref 1.7–2.4)

## 2022-07-06 LAB — TROPONIN T HIGH SENSITIVITY, 1 HOUR WITH REFLEX (BH GH LMW YH)
BKR TROPONIN T HS 1 HOUR DELTA FROM 0 HOUR: 8 ng/L
BKR TROPONIN T HS 1 HOUR: 71 ng/L — ABNORMAL HIGH

## 2022-07-06 LAB — TROPONIN T HIGH SENSITIVITY, 3 HOUR (BH GH LMW YH)
BKR TROPONIN T HS 1 HOUR DELTA FROM 0 HOUR ON 3HR: 8 ng/L
BKR TROPONIN T HS 3 HOUR DELTA FROM 0 HOUR: 8 ng/L
BKR TROPONIN T HS 3 HOUR: 71 ng/L — ABNORMAL HIGH

## 2022-07-06 LAB — TROPONIN T HIGH SENSITIVITY, 0 HOUR BASELINE WITH REFLEX (BH GH LMW YH): BKR TROPONIN T HS 0 HOUR BASELINE: 63 ng/L — ABNORMAL HIGH

## 2022-07-06 LAB — PHOSPHORUS     (BH GH L LMW YH): BKR PHOSPHORUS: 4.1 mg/dL (ref 2.2–4.5)

## 2022-07-06 LAB — NT-PROBNPE: BKR B-TYPE NATRIURETIC PEPTIDE, PRO (PROBNP): 1789 pg/mL — ABNORMAL HIGH (ref <125.0)

## 2022-07-06 MED ORDER — MIDODRINE 5 MG TABLET
5 mg | Freq: Three times a day (TID) | ORAL | Status: DC
Start: 2022-07-06 — End: 2022-07-06
  Administered 2022-07-06: 14:00:00 5 mg via ORAL

## 2022-07-06 MED ORDER — TORSEMIDE 20 MG TABLET
20 mg | Freq: Every day | ORAL | Status: AC | PRN
Start: 2022-07-06 — End: ?

## 2022-07-06 MED ORDER — RIVAROXABAN 20 MG TABLET
20 mg | Freq: Every day | ORAL | Status: DC
Start: 2022-07-06 — End: 2022-07-06

## 2022-07-06 MED ORDER — INSULIN LISPRO 100 UNIT/ML (SLIDING SCALE)
100 unit/mL | Freq: Three times a day (TID) | SUBCUTANEOUS | Status: DC
Start: 2022-07-06 — End: 2022-07-06

## 2022-07-06 MED ORDER — JARDIANCE 10 MG TABLET
10 mg | Freq: Every morning | ORAL | Status: AC
Start: 2022-07-06 — End: ?

## 2022-07-06 MED ORDER — RIVAROXABAN 15 MG TABLET
15 mg | ORAL_TABLET | Freq: Every day | ORAL | 2 refills | Status: AC
Start: 2022-07-06 — End: ?

## 2022-07-06 MED ORDER — TORSEMIDE 5 MG TABLET
5 mg | Freq: Every day | ORAL | Status: DC
Start: 2022-07-06 — End: 2022-07-06
  Administered 2022-07-06: 14:00:00 5 mg via ORAL

## 2022-07-06 MED ORDER — ROSUVASTATIN 5 MG TABLET
5 mg | Freq: Every evening | ORAL | Status: DC
Start: 2022-07-06 — End: 2022-07-06

## 2022-07-06 MED ORDER — FAMOTIDINE 4 MG/ML IN 0.9% SODIUM CHLORIDE (ADULT)
INTRAVENOUS | Status: DC
Start: 2022-07-06 — End: 2022-07-06
  Administered 2022-07-06: 14:00:00 5.000 mL via INTRAVENOUS

## 2022-07-06 MED ORDER — DEXTROSE 10 % IV BOLUS FOR ORDERABLE
INTRAVENOUS | Status: DC | PRN
Start: 2022-07-06 — End: 2022-07-06

## 2022-07-06 MED ORDER — FRUIT JUICE
ORAL | Status: DC | PRN
Start: 2022-07-06 — End: 2022-07-06

## 2022-07-06 MED ORDER — ALLOPURINOL 300 MG TABLET
300 mg | ORAL_TABLET | Freq: Every day | ORAL | 12 refills | Status: AC
Start: 2022-07-06 — End: ?

## 2022-07-06 MED ORDER — SPIRONOLACTONE (ALDACTONE) 12.5 MG HALFTAB
12.5 mg | Freq: Every day | ORAL | Status: DC
Start: 2022-07-06 — End: 2022-07-06
  Administered 2022-07-06: 14:00:00 12.5 mg via ORAL

## 2022-07-06 MED ORDER — SODIUM CHLORIDE 0.9 % (FLUSH) INJECTION SYRINGE
0.9 % | INTRAVENOUS | Status: DC | PRN
Start: 2022-07-06 — End: 2022-07-06

## 2022-07-06 MED ORDER — GLUCAGON 1 MG/ML IN STERILE WATER
Freq: Once | INTRAMUSCULAR | Status: DC | PRN
Start: 2022-07-06 — End: 2022-07-06

## 2022-07-06 MED ORDER — DEXTROSE 15 GRAM/60 ML ORAL LIQUID
1560 gram/60 mL | ORAL | Status: DC | PRN
Start: 2022-07-06 — End: 2022-07-06

## 2022-07-06 MED ORDER — JARDIANCE 10 MG TABLET
10 mg | Freq: Every morning | ORAL | Status: AC
Start: 2022-07-06 — End: 2022-07-06

## 2022-07-06 MED ORDER — RIVAROXABAN 15 MG TABLET
15 mg | Freq: Every day | ORAL | Status: DC
Start: 2022-07-06 — End: 2022-07-06

## 2022-07-06 MED ORDER — ALLOPURINOL 300 MG TABLET
300 mg | Freq: Every day | ORAL | Status: SS
Start: 2022-07-06 — End: 2022-07-06

## 2022-07-06 MED ORDER — SODIUM CHLORIDE 0.9 % (FLUSH) INJECTION SYRINGE
0.9 % | Freq: Three times a day (TID) | INTRAVENOUS | Status: DC
Start: 2022-07-06 — End: 2022-07-06

## 2022-07-06 MED ORDER — SKIM MILK
ORAL | Status: DC | PRN
Start: 2022-07-06 — End: 2022-07-06

## 2022-07-06 NOTE — ED Notes
10:08 PM 66 y/o male states sob a week ago now cp. Alert and oriented x's 4

## 2022-07-06 NOTE — ED Notes
10:08 PM

## 2022-07-06 NOTE — ED Triage Note
I evaluated this patient as the Physician in triage and completed a medical screening examination.  Patient will need further care.  Initial care orders will be placed as appropriate.Cherlynn Perches, MD9:03 PM

## 2022-07-06 NOTE — H&P
[facility]	History & PhysicalHistory provided by: the patientHistory limited by: no limitationsSubjective: Chief Complaint:  Chest painHPI:  66 year old male with past medical history of diabetes mellitus, hypertension, hyperlipidemia, coronary artery disease status post CABG, amyloidosis, atrial fibrillation on therapeutic anticoagulation sleep apnea, history of orthostatic hypotension on chronic midodrine presented to the emergency department with chest pain.  Patient states that earlier yesterday, he started having midsternal chest pain while at rest.  States that he never had symptoms like this in the past.  Has some mild associated cough.  Denies fever, phlegm, sick contacts.  Labs showed BUN/creatinine 44/1.71, proBNP 1789, high sensitivity troponin 63, 71.  Chest x-ray and EKG reviewed.  Patient was given aspirin 324 milligram, Tylenol, Benadryl.  Hospitalist team called for admission.  At the time of evaluation, patient was seen lying in bed.  ComfortableMedical History: PMH PSH Past Medical History: Diagnosis Date ? Arrhythmia  ? Chronic coronary artery disease  ? Diabetes mellitus (HC Code)  ? Encounter for blood transfusion  ? GERD (gastroesophageal reflux disease)  ? Hypercholesteremia  ? Hypertension  ? Obstructive sleep apnea  ? Syncope, psychogenic   Past Surgical History: Procedure Laterality Date ? CERVICAL DISC ARTHROPLASTY   ? TOOTH EXTRACTION   ? VALVE REPLACEMENT    Social History Family History Social History Socioeconomic History ? Marital status: Married   Spouse name: Not on file ? Number of children: Not on file ? Years of education: Not on file ? Highest education level: Not on file Occupational History ? Not on file Tobacco Use ? Smoking status: Former   Current packs/day: 0.00   Types: Cigarettes ? Smokeless tobacco: Never ? Tobacco comments:   Quit early 1980's Vaping Use ? Vaping Use: Never used Substance and Sexual Activity ? Alcohol use: Not Currently ? Drug use: No ? Sexual activity: Not on file Other Topics Concern ? Not on file Social History Narrative ? Not on file Social Determinants of Health Financial Resource Strain: Low Risk  (06/30/2022)  Overall Financial Resource Strain (CARDIA)  ? Difficulty of Paying Living Expenses: Not hard at all Food Insecurity: No Food Insecurity (06/30/2022)  Hunger Vital Sign  ? Worried About Programme researcher, broadcasting/film/video in the Last Year: Never true  ? Ran Out of Food in the Last Year: Never true Transportation Needs: No Transportation Needs (06/30/2022)  PRAPARE - Transportation  ? Lack of Transportation (Medical): No  ? Lack of Transportation (Non-Medical): No Physical Activity: Not on file Stress: Not on file Social Connections: Not on file Intimate Partner Violence: Not on file Housing Stability: Low Risk  (06/30/2022)  Housing Stability  ? Housing Stability: I have a steady place to live  Family History Problem Relation Age of Onset ? Heart disease Mother  ? Diabetes Mother  ? Stroke Mother  ? Heart failure Mother  ? Heart disease Father  ? Heart attack Father  ? Diabetes Sister  ? Diabetes Brother   Prior to Admission Medications Current Discharge Medication List  CONTINUE these medications which have NOT CHANGED  Details ammonium lactate (AMLACTIN) 12 % cream   JARDIANCE 10 mg tablet Take 1 tablet (10 mg total) by mouth daily before breakfast.  magnesium oxide (MAG-OX) 400 mg (241.3 mg magnesium) tablet Take 1 tablet (400 mg total) by mouth daily.  metFORMIN (GLUCOPHAGE) 500 MG Immediate Release tablet Take 1 tablet (500 mg total) by mouth 2 (two) times daily with breakfast and dinner.  midodrine (PROAMATINE) 10 mg tablet Take 1  tablet (10 mg total) by mouth 3 (three) times daily with meals.  ofloxacin (OCUFLOX) 0.3 % ophthalmic solution Place 1 drop into the right eye daily.  ONETOUCH VERIO test strips   prednisoLONE acetate (PRED FORTE) 1 % ophthalmic suspension Place 1 drop into the right eye 2 (two) times daily.  rivaroxaban (XARELTO) 20 mg Tab tablet 1 tablet (20 mg total) Daily @1700 .  rosuvastatin (CRESTOR) 5 MG tablet Take 1 tablet (5 mg total) by mouth nightly.  spironolactone (ALDACTONE) 25 mg tablet Take 0.5 tablets (12.5 mg total) by mouth daily.  tafamidis (VYNDAMAX) 61 mg capsule Take 1 capsule (61 mg total) by mouth daily.  torsemide (DEMADEX) 20 mg tablet Take 2 tablets (40 mg total) by mouth daily as needed. Take torsemide only if weight gain > 3 pounds from your baseline. If at your baseline weight, do not take the torsemide.Qty: 60 tablet, Refills: 4  vutrisiran (AMVUTTRA) 25 mg/0.5 mL Syrg syringe Inject 25 mg under the skin every 3 (three) months.  ZOLMitriptan (ZOMIG) 5 mg tablet Take 1 tablet (5 mg total) by mouth as needed for migraine.   STOP taking these medications   telmisartan (MICARDIS) 40 mg tablet Comments: Reason for Stopping:      Allergies Allergies Allergen Reactions ? Penicillin G Swelling ? Atorvastatin   Review of Systems: Otherwise, constitutional, head, eye, ENT, cardiac, pulmonary, gastroenterological, musculoskeletal, skin, endocrine, hematologic, allergy, and immunologic ROS are unremarkable.Objective: Vitals:I have reviewed the patient's current vital signs as documented in the patient's EMR.  Physical Exam: Constitutional: NADHENT: Normocephalic, atraumatic. Eyes: PERRL, EOM intact, conjunctivae normalNeck: ROM normal, suppleMouth/Throat: Oropharynx is clear and moist. Cardiovascular: Normal rate, regular rhythm and normal heart sounds.  Pulmonary/Chest: Effort normal. No wheezing, no rales. Abdominal: Soft. Bowel sounds are normal, non tender, non distended.Musculoskeletal:no acute inflammation, no tenderness. Neurological:alert and oriented to person, place, and time. no focal deficits noted. CN grossly intactLabs: Recent Labs Lab 09/06/231619 09/07/230616 09/12/232115 WBC 4.4 3.4* 5.4 HGB 13.1* 12.2* 13.7 HCT 39.10 36.50* 41.50 MCV 84.8 84.3 84.9 PLT 256 237 282 Recent Labs Lab 09/07/230616 09/07/231140 09/12/232115 09/12/232218 NA 133*  --  121* 131* K 4.4  --   --  4.6 CL 101  --  89* 98 CO2 22  --  24 23 BUN 39*  --  45* 44* CREATININE 1.43*  --  1.79* 1.71* GLU 79   < > 148* 119* ANIONGAP 10  --  8 10 CALCIUM 9.6  --  9.3 9.2 MG  --   --  2.5*  --   < > = values in this interval not displayed. Recent Labs Lab 09/06/231619 09/12/232218 ALBUMIN 4.2 3.9 AST 41* 44* ALT 11 8* ALKPHOS 123* 113 BILITOT 1.1 0.8 No results for input(s): LABPROT, INR, PTT in the last 168 hours.Recent Labs Lab 09/07/230616 09/07/231140 09/07/231718 09/07/231805 09/12/232115 09/12/232218 GLU 79 119* 139* 139* 148* 119* No results for input(s): SPECGRAV, PHUR, BACTERIA, WBCUA, LEUKOCYTESUR, BLOODU, RBCUA, GLUCOSEUR, KETONESU, HYALINECASTS in the last 168 hours.Invalid input(s): NITRITERBCUANo results for input(s): PHART, PCO2ART, PO2ART, HCO3ART, O2SATART, LITERFLOW in the last 168 hours.Diagnostic Review:I have reviewed the patient's Radiology report(s) within the last 48 hrs. Significant findings are per EMR. CXR Final Result  ECG/Tele Events: I have reviewed the patient's ECG as resulted in the EMR.Assessment/Plan: 1. Chest pain2. Elevated troponinPlan Telemetry monitoringSerial EKG, trend troponinCardiology evaluation Check urine toxicology screenFamotidine No tachycardia, saturating 100% on room air.  Already on therapeutic anticoagulation.  Low suspicion for PEPatient's home medications need to be  confirmed.  Pharmacy medication reconciliation request placedFull CodeI have reviewed the patient's problem list and updated it as needed.I have personally reviewed medical records on the patient. Prophylaxis: Early mobilizationNotifications: PCP: [name] [phone] discussed with patient and/or family. YesThe total time spent providing direct patient care including interviewing and conducting a physical examination on the patient, reviewing patient's chart for this encounter and for previous encounters, reviewing progress notes, consult notes, medications, ordering labs and updating the plan of care to the patient/family was greater than 75 minutesDisclaimerPortions of this note were likely created using dictation software. Efforts were made to ensure accuracy, but transcription errors might be present. For any questions, please don't hesitate to call. Signed:Verneda Hollopeter Jeri Modena, MD Beeper: smart web9/13/20232:42 AM

## 2022-07-06 NOTE — Other
Admission Note Nursing Frank Bradley is a 66 y.o. male admitted with a chief complaint of chest pain. Patient arrived from ED, originally from home.Patient is A+Ox4. On RA. CPAP for sleep. Vitals:  07/06/22 0145 07/06/22 0154 07/06/22 0236 07/06/22 0343 BP:  100/62 104/70  Pulse:  (!) 58 (!) 57 (!) 59 Resp: 18 18 18 18  Temp: 97.5 ?F (36.4 ?C)  97.5 ?F (36.4 ?C)  TempSrc: Oral  Oral  SpO2:  100% 100% 96% Weight:   73.7 kg  Height:   6' 2 (1.88 m)  Oxygen therapy Oxygen TherapySpO2: 96 %Device (Oxygen Therapy): CPAPI have reviewed the patient's current medication orders. Medications from home stored in med room. Meds that are not in [facility] at this time: metformin, vyndomax, ofloxacin eye drops, gentamicin eye drops. Provider aware.I have reviewed patient valuables Belongings charted in last 7 days: Valuable(s) : Dentures; Vision; [place]; Holiday representative; Keys; Wallet; Money (07/06/2022  2:36 AM) Comments:See flowsheets, patient education and plan of care for additional information. Layali Freund, RN9/13/2023

## 2022-07-06 NOTE — Plan of Care
Problem: Adult Inpatient Plan of CareGoal: Plan of Care ReviewOutcome: Interventions implemented as appropriate Problem: Obstructive Sleep Apnea Risk or ActualGoal: Unobstructed Breathing During SleepOutcome: Interventions implemented as appropriate Plan of Care Overview/ Patient Status    Pt initiated for CPAP O/N. 10cmH2O. via medium full face mask. Vitals stable. RCP will continue to follow.

## 2022-07-06 NOTE — Other
Frank Bradley was discharged via [employer] accompanied by Alone.  Verbalized understanding of discharge instructionsand recommended follow up care as per the after visit summary.  Written discharge instructions provided. Denies any further questions. IV access x 2 removed. Patient's home medications given to him. Vital signs    Vitals:  07/06/22 0535 07/06/22 0800 07/06/22 1017 07/06/22 1140 BP: 101/68  109/72 110/68 Pulse: (Abnormal) 58  66 (Abnormal) 53 Resp: 18  18  Temp:   97.5 ?F (36.4 ?C) 97.7 ?F (36.5 ?C) TempSrc: Axillary  Oral Oral SpO2: 99%  100% 100% Weight:  75.2 kg   Height:     Belongings charted in last 7 days: Valuable(s) : Dentures; Vision; [place]; Holiday representative; Keys; Wallet; Money (07/06/2022  2:36 AM)

## 2022-07-06 NOTE — Other
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  Last cath 02/2021 patent LIMA to LAD and otherwise mild disease.  Last echo 02/2022 dilated LV with severe LVH, EF 38%, bio AVR not assessed (though in 11/2021 peak gradient 19), 1+ MR, PASP 44 but likely underestimated, dilated RV with decreased RV fxn, severe TR and no pericardial effusion.  Was recently here with acute on chronic systolic CHF in the setting of poor compliance with his medications.  D-dimer was elevated at that time, but V/Q scan was low probability.  He was diuresed and improvedHe now presents with chest discomfort which started yesterday.  He describes it as an aching in his chest.  There is no associated shortness breath, palpitations, nausea, lightheadedness, or diaphoresis.  It worsens with movement of his chest wall and is tender.  He denies trauma or lifting anything heavy prior to this.  EKG shows sinus rhythm with no major changes.  Troponins 63/71/71, similar to last admission.  CXR no acute abnormality.  No overt CHF on examAllergies  Allergies Allergen Reactions ? Penicillin G Swelling ? Atorvastatin  Medications Inpatient Medications:Current Facility-Administered Medications Medication Dose Route Frequency Provider Last Rate Last Admin ? famotidine (PF) (PEPCID) 20 mg in sodium chloride 0.9% PF 5 mL Injection  20 mg IV Push Daily Heywood Footman, MD     ? insulin lispro (Admelog, HumaLOG) Sliding Scale (See admin instructions for dose) 1-18 Units  1-18 Units Subcutaneous TID AC Heywood Footman, MD     ? midodrine (PROAMATINE) tablet 10 mg  10 mg Oral TID WC Heywood Footman, MD     ? rivaroxaban Carlena Hurl) tablet Tab 15 mg  15 mg Oral Daily (1700) Heywood Footman, MD     ? rosuvastatin (CRESTOR) tablet 5 mg  5 mg Oral Nightly Heywood Footman, MD     ? sodium chloride 0.9 % flush 3 mL  3 mL IV Push Q8H Heywood Footman, MD     ? spironolactone (ALDACTONE) tablet 12.5 mg  12.5 mg Oral Daily Heywood Footman, MD     ? torsemide [facility]) tablet 10 mg  10 mg Oral Daily Heywood Footman, MD     Outpatient Medications: ? ammonium lactate (AMLACTIN) 12 % cream    ? JARDIANCE 10 mg tablet Take 1 tablet (10 mg total) by mouth daily before breakfast.   ? magnesium oxide (MAG-OX) 400 mg (241.3 mg magnesium) tablet Take 1 tablet (400 mg total) by mouth daily.   ? metFORMIN (GLUCOPHAGE) 500 MG Immediate Release tablet Take 1 tablet (500 mg total) by mouth 2 (two) times daily with breakfast and dinner.   ? midodrine (PROAMATINE) 10 mg tablet Take 1 tablet (10 mg total) by mouth 3 (three) times daily with meals.   ? ofloxacin (OCUFLOX) 0.3 % ophthalmic solution Place 1 drop into the right eye daily.   ? ONETOUCH VERIO test strips    ? prednisoLONE acetate (PRED FORTE) 1 % ophthalmic suspension Place 1 drop into the right eye 2 (two) times daily.   ? rivaroxaban (XARELTO) 20 mg Tab tablet 1 tablet (20 mg total) Daily @1700 .   ?  rosuvastatin (CRESTOR) 5 MG tablet Take 1 tablet (5 mg total) by mouth nightly.   ? spironolactone (ALDACTONE) 25 mg tablet Take 0.5 tablets (12.5 mg total) by mouth daily.   ? tafamidis (VYNDAMAX) 61 mg capsule Take 1 capsule (61 mg total) by mouth daily.   ? torsemide (DEMADEX) 20 mg tablet Take 2 tablets (40 mg total) by mouth daily as needed. Take torsemide only if weight gain > 3 pounds from your baseline. If at your baseline weight, do not take the torsemide. (Patient taking differently: Take 0.5 tablets (10 mg total) by mouth daily as needed. Take torsemide only if weight gain > 3 pounds from your baseline. If at your baseline weight, do not take the torsemide.) 60 tablet 4 ? vutrisiran (AMVUTTRA) 25 mg/0.5 mL Syrg syringe Inject 25 mg under the skin every 3 (three) months.   ? ZOLMitriptan (ZOMIG) 5 mg tablet Take 1 tablet (5 mg total) by mouth as needed for migraine.   Past Medical History  Patient Active Problem List  Diagnosis  ? Shortness of breath  ? Elevated troponin  ? Typical atrial flutter (HC Code) (HC CODE) (HC Code)  ? Atrial fibrillation, unspecified type (HC Code) (HC CODE) (HC Code)  ? Coronary artery disease involving native coronary artery of native heart without angina pectoris  ? S/P cryoablation of arrhythmia    10/07/16: Cryoablation/PVI and RFA of baseline AFLutterAblated to sinusThen transseptal to LAIsolated PVs ? S/P CABG (coronary artery bypass graft)  ? Type 2 diabetes mellitus (HC Code)  ? Sleep apnea  ? Chest pain  ? Hypotension  ? Cardiac amyloidosis (HC Code) (HC CODE) (HC Code)  ? Decompensated heart failure (HC Code) (HC CODE) (HC Code)  ? Acute systolic CHF (congestive heart failure) (HC Code) (HC CODE) (HC Code)  Past Medical History: Diagnosis Date ? Arrhythmia  ? Chronic coronary artery disease  ? Diabetes mellitus (HC Code)  ? Encounter for blood transfusion  ? GERD (gastroesophageal reflux disease)  ? Hypercholesteremia  ? Hypertension  ? Obstructive sleep apnea  ? Syncope, psychogenic  Past Surgical History: Procedure Laterality Date ? CERVICAL DISC ARTHROPLASTY   ? TOOTH EXTRACTION   ? VALVE REPLACEMENT   Family History  Family History Problem Relation Age of Onset ? Heart disease Mother  ? Diabetes Mother  ? Stroke Mother  ? Heart failure Mother  ? Heart disease Father  ? Heart attack Father  ? Diabetes Sister  ? Diabetes Brother  Social History Social History Tobacco Use Smoking Status Former ? Current packs/day: 0.00 ? Types: Cigarettes Smokeless Tobacco Never Tobacco Comments  Quit early 1980's Social History Substance and Sexual Activity Alcohol Use Not Currently Occupational History ? Not on file Review of Systems CONSTITUTIONAL Negative for weight loss, fever/chills, night sweats. EYES Negative for blurred or double vision. ENT Negative for ringing, hoarseness or dysphagia. CARDIOVASCULAR Negative for orthopnea, paroxysmal nocturnal dyspnea, pedal edema. RESPIRATORY Negative for cough, , wheezing, hemoptysis. GI Denies melena or hematochezia. GU Negative for hematuria.  ENDOCRINE Negative for new issues regarding diabetes, thyroid, pituitary problems MUSCULOSKELETAL Negative for new issues regarding arthritis, gout, or bone pain. SKIN Negative for itching, bruising, or rashes. NEUROLOGIC Negative for headaches, unilateral weakness, dizziness, numbness, tingling, mental status changes. PSYCHIATRIC Negative for anxiety or depression. HEMATOLOGIC Negative for history of anemia or thrombocytopenia. PAIN No pain or discomfort. Physical Exam BP 101/68  - Pulse (!) 58  - Temp 97.5 ?F (36.4 ?C) (Oral)  - Resp 18  - Ht 6'  2 (1.88 m)  - Wt 73.7 kg  - SpO2 99%  - BMI 20.85 kg/m?    Temp:  [97.5 ?F (36.4 ?C)-97.9 ?F (36.6 ?C)] 97.5 ?F (36.4 ?C)Pulse:  [55-74] 58Resp:  [16-18] 18BP: (89-121)/(62-70) 101/68SpO2:  [96 %-100 %] 99 %Weight change: Gross Totals (Last 24 hours) at 07/06/2022 0825Last data filed at 07/06/2022 0300Intake -- Output 100 ml Net -100 ml Physical Exam Temp:  [97.5 ?F (36.4 ?C)-97.9 ?F (36.6 ?C)] 97.5 ?F (36.4 ?C)Pulse:  [55-74] 58Resp:  [16-18] 18BP: (89-121)/(62-70) 101/68SpO2:  [96 %-100 %] 99 %Device (Oxygen Therapy): CPAP   07/06/2022   2:36 AM 06/30/2022  10:00 AM 06/29/2022   3:28 PM Last 3 weights Weight (lb) 162 lb 6.4 oz 163 lb 2.3 oz 163 lb 2.3 oz Weight (kg) 73.664 kg 74 kg 74 kg  Body mass index is 20.85 kg/m?Marland Kitchen General Appearance No acute distress Skin No generalized rashes HEENT MMM Neck No bruits Lungs Pretty clear Cardiac Regular, no S3, 1/6 systolic murmur Abdomen Active bowel sounds, soft and non-tender Extremities No edema, warm Neurologic  Nonfocal Chest wall Tender, reproduces the pain Data No results for input(s): CKTOTAL, TROPONINI, TROPONINT, CKMBINDEX in the last 168 hours.Recent Labs Lab 09/07/230616 09/07/231140 09/12/232115 09/12/232218 09/13/230531 09/13/230750 NA 133*  --  121* 131* 129*  --  K 4.4  --   --  4.6 4.1  --  CL 101  --  89* 98 98  --  CO2 22  --  24 23 22   --  BUN 39*  --  45* 44* 49*  --  CREATININE 1.43*  --  1.79* 1.71* 1.78*  --  GLU 79   < > 148* 119* 96 91 CALCIUM 9.6  --  9.3 9.2 9.3  --  PHOS  --   --   --   --  4.1  --   < > = values in this interval not displayed. 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  Last cath 02/2021 patent LIMA to LAD and otherwise mild disease.  Last echo 02/2022 dilated LV with severe LVH, EF 38%, bio AVR not assessed (though in 11/2021 peak gradient 19), 1+ MR, PASP 44 but likely underestimated, dilated RV with decreased RV fxn, severe TR and no pericardial effusion.  Was recently here with acute on chronic systolic CHF in the setting of poor compliance with his medications. D-dimer was elevated at that time, but V/Q scan was low probability.  He was diuresed and improvedHe now presents with chest discomfort which started yesterday.  He describes it as an aching in his chest.  There is no associated shortness breath, palpitations, nausea, lightheadedness, or diaphoresis.  It worsens with movement of his chest wall and is tender.  He denies trauma or lifting anything heavy prior to this.  EKG shows sinus rhythm with no major changes.  Troponins 63/71/71, similar to last admission.  CXR no acute abnormality.  No overt CHF on examMost consistent with musculoskeletal pain.  Troponins flat and not significantly elevated compared to his last admission.  No acute EKG changes.  Catheterization 1 year ago with patent LIMA and otherwise mild diseaseRecommendations 1.  Pain management for musculoskeletal pain2.  Continue usual cardiac medications3.  Consider outpatient nuclear stress test	Note he is living in [place] and gets the majority of his care there	While he is here, he has been following with Dr. Marisue Humble, MD, Cardiac SpecialistsNEMGOffice:  [phone]/13/20238:25 AM

## 2022-07-06 NOTE — Utilization Review (ED)
UM Status: Managed Medicare IP Chest pain, Hyponatremia of 121 BNP elevatedKim Zoiey Christy RNED Utilization CoordinatorMHB [phone] [email or web address]

## 2022-07-06 NOTE — Plan of Care
Plan of Care Overview/ Patient Status    Pt presents Chest painHx from chart:  diabetes mellitus, hypertension, hyperlipidemia, coronary artery disease status post CABG, amyloidosis, atrial fibrillation on therapeutic anticoagulation sleep apnea, history of orthostatic hypotension on chronic midodrine Admitting diagnosis: Chest painCase Management Screening  Flowsheet Row Most Recent Value Case Management Screening: Chart review completed. If YES to any question below then proceed to CM Eval/Plan  Is there a change in their cognitive function No Do you anticipate a change in this patient's physicial function that will effect discharge needs? No Has there been a readmission within the last 30 days Yes Were there services prior to admission ( Examples: Assisted Living, HD, Homecare, Extended Care Facility, Methadone, SNF, Outpatient Infusion Center) No Negative/Positive Screen Positive Screening: Complete CM Evaluation and Plan Case Manager Attestation  I have reviewed the medical record and completed the above screen. CM staff will follow patient's progress and discuss the plan of care with the Treatment Team. Yes  Case Management Evaluation  Flowsheet Row Most Recent Value Case Management Evaluation and Plan  Arrived from prior to admission home/apartment/condo Services Prior to Admission none Patient Requires Care Coordination Intervention Due To readmission within the last 30 days Prior to Hospitalization: Assistance Needed/DME being used Ambulation Ambulation Assistance/DME: stairs to enter the home Documented Insurance Accurate Yes Any financial concerns related to anticipated discharge needs No Patient's home address verified Yes Patient's PCP of record verified Yes Last Date Seen by PCP 3-6 months Living Environment  Current Living Arrangements home/apartment/condo Source of Clinical History  Patient's clinical history has been reviewed and source of Information is: Patient, Medical record Case Manager Attestation  I have reviewed the medical record and completed the above evaluation with the following recommendations. Yes Discharge Planning Coordination Recommendations  Discharge Planning Coordination Recommendations Home with MD follow-up/No needs identified Case Manager reviewed plan of care/ continuum of care need's with  Patient  Chart review completed.Case Manager spoke with patient at bedside to introduce self and role of Case Manager.Confirmed address, phone number, PCP and insurance informationConsented to Case Management assistance with post discharge care coordination needs.  See the above Initial Assessment for details regarding pre-admission support, outpatient services, and medical equipment. Pt is A&Ox4 Independe\nt. Drove self to hospitalNo CM needsRecent admission for SOB on 9/6See flowsheet documentation for assessments and interventionsPer MD patient is medically cleared for discharge home.  No CM needs. Case Management Plan  Flowsheet Row Most Recent Value Discharge Planning  Patient/Patient Representative goals/treatment preferences for discharge are:  home Patient/Patient Representative was presented with a list of facilities, agencies and/or dme providers and Referral(s) placed for: None Mode of Transportation  Private car  (add comment for special considerations) Patient accompanied by self CM D/C Readiness  PASRR completed and approved N/A Authorization number obtained, if required N/A Is there a 3 day INPATIENT Qualifying stay for Medicare Patients? N/A Medicare IM- signed, dated, timed and scanned, if required N/A DME Authorized/Delivered N/A No needs identified/ follow up with PCP/MD Yes Post acute care services secured W10 complete N/A Pri Completed and Accepted  N/A Is the destination address correct on the W10 N/A Finalized Plan Expected Discharge Date 07/06/22 Discharge Disposition Home or Self Care  Pt to follow up  with  issues to be addressed post discharge as ordered by MD.Frank Bradley Frank Hutching, Frank BSNCase ManagementBridgeport [phone]) [phone]

## 2022-07-06 NOTE — ED Notes
1:58 AM ED to Floor HandoffAdmission Dx:      cp        Telemetry: 	[x]  Yes		[]  NoOxygen Tank on Stretcher >1000 PSI:  []  YesCode Status:   [x]  Full		[]  Partial		[]  No CodeIsolation: 	 [x] None	[]  Contact	[]  Droplet	[]  AirborneSafety Precautions: [x]  None	[]  Sitter   []  Restraints	[]  Suicidal	[]  Fall Risk	Other (specify):           Mentation/Orientation:    A&O (person, place, time and situation) x     4     	 		Disoriented to:          Deficits: []  Hearing impaired	[]  Blind  	[]  Nonverbal		 []  ID/DD (intelectual disability/developmental disability)Ambulation: [x]  Ambulatory	[]  Non-Ambulatory  Diet: [x]  OK to eat	[]  NPO	Other (specify):           IV Access: [x]  Yes   []  No     Vital signs Charted in the last hour: [x] Yes    []  No         Other (specify)            Skin Alteration:[]  Pressure Injury	[]  Wound	[]  None	[x]  Skin Not AssessedDiarrhea/Loose stool : []  1x within 24h  []  2x within 24h  []  3x within 24h  [x]  None      C.Diff Order:  []  Ordered- needs to be collected    []  Collected-sent to lab    []  Resulted - Negative C.Diff  []  Resulted - Positive C.Diff    []  Not Ordered   	[]  N/APatient Belongings:Does the patient have belongings going to the floor?   [] Yes    [x]  NoAre the belongings documented?          [] Yes    []  NoIs someone taking belongings home?   [] Yes    []  No   Who (specify):           _________ED RN and MHB #:      Victoriah Wilds, rn [phone]             Please call the ED RN if you have any questions            You can always call the [facility] ED charge nurse at [phone] if you have any concerns [facility] ED [facility] Emergency Department

## 2022-07-06 NOTE — Plan of Care
Plan of Care Overview/ Patient Status    Assumed patient care at ~0230. See admission note for details. Patient is alert and oriented x4. Denies any pain or SOB. On tele, SB/NSR with PVCs, HR 50s-60s. On RA, CPAP for sleep. Continent of both, urinal within reach. Independent in the room. Frequent positioning encouraged.Safety precautions maintained. Call bell use encouraged and within reach. Medications administered per MAR. See flowsheet for assessment data. Interventions per plan of care. WCTM. Problem: Adult Inpatient Plan of CareGoal: Plan of Care ReviewOutcome: Interventions implemented as appropriateFlowsheets (Taken 07/06/2022 0421)Progress: no changePlan of Care Reviewed With: patient Ardeen Garland, RN9/13/2023

## 2022-07-06 NOTE — ED Provider Notes
Chief Complaint Patient presents with ? Chest Pain   C/o chest pain . Endorses midsternal CP. Denies radiation. Endorses cardiac Hx. Denies prior stemi. Had valve replacement and CABG in the past. Recent dx of amyloidosis.  HPI/PE:66 year old male with past medical history diabetes mellitus, hypertension, hyperlipidemia, CAD, amyloidosis presents for midsternal chest pain.  Patient states his symptoms started earlier today while at rest.  He states he has never had symptoms like this in the past.  He states he has been recently evaluated in the hospital for shortness of breath.  He states he has been having associated cough but otherwise denies any fevers or any other symptoms.Patient presents with vitals within normal limits with unremarkable examination.  In setting of midsternal chest pain will obtain ECG and troponin to evaluate for ACS.  Patient with intermittency of symptoms and low clinical concern for pulmonary embolus.  Will obtain chest x-ray, CBC, CMP, administer symptomatic treatment and reassess to determine disposition.  Physical ExamED Triage VitalsBP: 121/68 [07/05/22 2126]Pulse: 72 [07/05/22 2120]Pulse from  O2 sat: n/aResp: 16 [07/05/22 2126]Temp: 97.9 ?F (36.6 ?C) [07/05/22 2120]Temp src: Oral [07/05/22 2120]SpO2: n/a BP 100/62  - Pulse (!) 58  - Temp 97.5 ?F (36.4 ?C) (Oral)  - Resp 18  - SpO2 100% Physical ExamVitals reviewed. Constitutional:     Appearance: Normal appearance. He is well-developed. HENT:    Head: Normocephalic and atraumatic.    Right Ear: External ear normal.    Left Ear: External ear normal.    Nose: Nose normal.    Mouth/Throat:    Mouth: Mucous membranes are moist. Eyes:    Extraocular Movements: Extraocular movements intact.    Conjunctiva/sclera: Conjunctivae normal.    Pupils: Pupils are equal, round, and reactive to light. Cardiovascular:    Rate and Rhythm: Normal rate and regular rhythm.    Pulses: Normal pulses. Pulmonary:    Effort: Pulmonary effort is normal. Abdominal:    General: Abdomen is flat. Musculoskeletal:       General: Normal range of motion.    Cervical back: Normal range of motion. Skin:   General: Skin is warm.    Capillary Refill: Capillary refill takes less than 2 seconds. Neurological:    General: No focal deficit present.    Mental Status: He is alert.  ProceduresAttestation/Critical CarePatient Reevaluation: Troponin 63 and 71 while remainder of CBC, CMP nonactionable. Admitted to hospitalist service.Clinical Impressions as of 07/06/22 0206 Elevated troponin Chest pain, unspecified type  ED DispositionAdmit Juanda Bond, MD09/13/23 0206

## 2022-07-07 ENCOUNTER — Other Ambulatory Visit (HOSPITAL_COMMUNITY): Payer: Self-pay

## 2022-07-07 ENCOUNTER — Encounter: Admit: 2022-07-07 | Payer: PRIVATE HEALTH INSURANCE | Primary: Internal Medicine

## 2022-07-07 NOTE — Discharge Summary
 HospitalMed/Surg Discharge SummaryPatient Data:  Patient Name: Frank Bradley Admit date: N/A Age: 66 y.o. Discharge date: 9/7 DOB: [birth year]	 Discharge Attending Physician: No att. providers found  MRN: [id number]	 Discharged Condition: good PCP: [name], MD Disposition: Home  Principal Diagnosis: dyspnea 2/2 amyloidosis Issues to be Addressed Post Discharge: Follow-up Information:No follow-up provider specified. Future Appointments Date Time Provider Department Center 08/23/2022 12:00 PM [name], MD HEART FAIL [facility] 08/25/2022 11:00 AM [name], RN [facility] 12/01/2022  1:45 PM [name], Sunday Corn, MD MUSCULAR [facility] Hospital Course: Hospital Course: Patient was placed under observation for dyspnea workup.  Chest x-ray was noted for acute pathology.  Troponin stabilized with patient denying any chest pain.  Patient's D-dimer came back elevated due to renal function, will undergo V/Q scan, if negative patient can go home.  Continue all home medications and follow up with personal Cardiologist.Inpatient Consultants and summary of recommendations:Internal Medicine0Pertinent lab findings and test results: Objective: Recent Labs Lab 09/06/231619 09/07/230616 WBC 4.4 3.4* HGB 13.1* 12.2* HCT 39.10 36.50* PLT 256 237  Recent Labs Lab 09/06/231619 09/07/230616 NEUTROPHILS 64.1 51.4  Recent Labs Lab 09/06/231619 09/07/230614 09/07/230616 09/07/231140 NA 134*  --  133*  --  K 4.5  --  4.4  --  CL 101  --  101  --  CO2 21  --  22  --  BUN 42*  --  39*  --  CREATININE 1.94*  --  1.43*  --  GLU 98   < > 79 119* ANIONGAP 12  --  10  --   < > = values in this interval not displayed.  Recent Labs Lab 09/06/231619 09/07/230616 CALCIUM 9.9 9.6 MG 2.5*  --   Recent Labs Lab 09/06/231619 ALT 11 AST 41* ALKPHOS 123* BILITOT 1.1 No results for input(s): PTT, LABPROT, INR in the last 168 hours. Culture Information:No results for input(s): LABBLOO, LABURIN, LOWERRESPIRA in the last 168 hours.Imaging: Imaging results last 24h: XR Chest PA or APResult Date: 9/6/2023No acute findings. [facility] Radiology Notify System Classification:  Routine Reported and signed by: [name], MD Diet:  Regular dietMobility: Highest Level of mobility - ACTUAL: Mobility Level 7, Walk 25+ feet, AM PAC 22-23  Physical Exam Discharge vitals: Temp:  [97.2 ?F (36.2 ?C)-97.6 ?F (36.4 ?C)] 97.4 ?F (36.3 ?C)Pulse:  [56-73] 58Resp:  [16-20] 18BP: (104-132)/(70-83) 132/83SpO2:  [99 %-100 %] 100 %Device (Oxygen Therapy): room air Pertinent Findings of Physical Exam: UnremarkableCognitive Status at Discharge: BaselineDischarge Physical Exam:Physical Exam0Allergies Allergies Allergen Reactions ? Penicillin G Swelling ? Atorvastatin   PMH PSH Past Medical History: Diagnosis Date ? Arrhythmia  ? Chronic coronary artery disease  ? Diabetes mellitus (HC Code)  ? Encounter for blood transfusion  ? GERD (gastroesophageal reflux disease)  ? Hypercholesteremia  ? Hypertension  ? Obstructive sleep apnea  ? Syncope, psychogenic   Past Surgical History: Procedure Laterality Date ? CERVICAL DISC ARTHROPLASTY   ? TOOTH EXTRACTION   ? VALVE REPLACEMENT    Social History Family History Social History Tobacco Use ? Smoking status: Former   Current packs/day: 0.00   Types: Cigarettes ? Smokeless tobacco: Never ? Tobacco comments:   Quit early 1980's Substance Use Topics ? Alcohol use: Not Currently  Family History Problem Relation Age of Onset ? Heart disease Mother  ? Diabetes Mother  ? Stroke Mother  ? Heart failure Mother  ? Heart disease Father  ? Heart attack Father  ? Diabetes Sister  ? Diabetes Brother Discharge  Medications: Discharge: Current Discharge Medication List  CONTINUE these medications which have NOT CHANGED  Details ammonium lactate (AMLACTIN) 12 % cream   magnesium oxide (MAG-OX) 400 mg (241.3 mg magnesium) tablet Take 1 tablet (400 mg total) by mouth daily.  midodrine (PROAMATINE) 10 mg tablet Take 1 tablet (10 mg total) by mouth 3 (three) times daily with meals.  rosuvastatin (CRESTOR) 5 MG tablet Take 1 tablet (5 mg total) by mouth nightly.  spironolactone (ALDACTONE) 25 mg tablet Take 0.5 tablets (12.5 mg total) by mouth daily.  tafamidis (VYNDAMAX) 61 mg capsule Take 1 capsule (61 mg total) by mouth daily.  vutrisiran (AMVUTTRA) 25 mg/0.5 mL Syrg syringe Inject 25 mg under the skin every 3 (three) months.  ZOLMitriptan (ZOMIG) 5 mg tablet Take 1 tablet (5 mg total) by mouth as needed for migraine.  ofloxacin (OCUFLOX) 0.3 % ophthalmic solution Place 1 drop into the right eye daily.  ONETOUCH VERIO test strips   prednisoLONE acetate (PRED FORTE) 1 % ophthalmic suspension Place 1 drop into the right eye 2 (two) times daily.   STOP taking these medications   JARDIANCE 10 mg tablet    metFORMIN (GLUCOPHAGE) 500 MG Immediate Release tablet    rivaroxaban (XARELTO) 20 mg Tab tablet    torsemide (DEMADEX) 20 mg tablet    telmisartan (MICARDIS) 40 mg tablet    There was at total of approximately 35 minutes spent on the discharge of this patientElectronically Signed:Hadlea Furuya Virl Son, MD 06/30/2022 3:09 PMBest Contact Information: 0

## 2022-07-08 ENCOUNTER — Ambulatory Visit: Admit: 2022-07-08 | Payer: PRIVATE HEALTH INSURANCE | Attending: Urology | Primary: Internal Medicine

## 2022-07-08 ENCOUNTER — Encounter: Admit: 2022-07-08 | Payer: PRIVATE HEALTH INSURANCE | Attending: Urology | Primary: Internal Medicine

## 2022-07-08 DIAGNOSIS — G4733 Obstructive sleep apnea (adult) (pediatric): Secondary | ICD-10-CM

## 2022-07-08 DIAGNOSIS — E119 Type 2 diabetes mellitus without complications: Secondary | ICD-10-CM

## 2022-07-08 DIAGNOSIS — I499 Cardiac arrhythmia, unspecified: Secondary | ICD-10-CM

## 2022-07-08 DIAGNOSIS — K219 Gastro-esophageal reflux disease without esophagitis: Secondary | ICD-10-CM

## 2022-07-08 DIAGNOSIS — N5089 Other specified disorders of the male genital organs: Secondary | ICD-10-CM

## 2022-07-08 DIAGNOSIS — I2510 Atherosclerotic heart disease of native coronary artery without angina pectoris: Secondary | ICD-10-CM

## 2022-07-08 DIAGNOSIS — E7800 Pure hypercholesterolemia, unspecified: Secondary | ICD-10-CM

## 2022-07-08 DIAGNOSIS — F488 Other specified nonpsychotic mental disorders: Secondary | ICD-10-CM

## 2022-07-08 DIAGNOSIS — Z5189 Encounter for other specified aftercare: Secondary | ICD-10-CM

## 2022-07-08 DIAGNOSIS — N399 Disorder of urinary system, unspecified: Secondary | ICD-10-CM

## 2022-07-08 DIAGNOSIS — I10 Essential (primary) hypertension: Secondary | ICD-10-CM

## 2022-07-08 NOTE — Progress Notes
[facility] Care Navigation:  Transition of Care Note Care Navigation spoke with: PatientDischarging Hospital[facility]: 9/12/2023Hospital Discharge Date: 07/06/2022    Diagnosis: Chest painDischarge location: HomeHOSPITALIZATION:Reason patient admitted: Chest PainDiagnosis at discharge: Chest painCURRENT STATE:Since discharge patient reports feeling: Better  Questions regarding surgical site: na REVIEW OF AFTER VISIT SUMMARY DOCUMENT:Patient specific questions regarding discharge: noMEDICATION CHANGES:Validated NEW medications to take: N/AValidated Changed medications to take: YesValidated Stopped medications to NOT take: YesIssues obtaining prescriptions: No FOLLOW-UP APPOINTMENTS and TRANSPORTATION:Patient aware of scheduled appointments: YesAwareness and assistance with appointments needing to be scheduled: NoTransportation concerns for follow-up appointment: NoDME and HOME HEALTH SERVICES:Durable medical equipment received: N/AContact has been made with home care agency: N/APlan established for follow up labs/tests: N/AOther follow up items: naADDITIONAL PATIENT NEEDS:Identified Social Determinants of Health opportunities: noAdditional patient needs addressed: no

## 2022-07-08 NOTE — Progress Notes
[facility] Care Navigation:  Transition of Care Note Care Navigation spoke with: PatientDischarging Hospital[facility]: 9/12/2023Hospital Discharge Date: 07/06/2022    Diagnosis: Chest painDischarge location: HomeHOSPITALIZATION:Reason patient admitted: Chest PainDiagnosis at discharge: Chest painCURRENT STATE:Since discharge patient reports feeling: Better Patient cared for by: independentQuestions regarding surgical site: na REVIEW OF AFTER VISIT SUMMARY DOCUMENT:Patient specific questions regarding discharge: NoHe asked about the reason for decreasing Xarelto from 20mg  to 15mg  daily. I couldn't find any note mentioning during admission, consulted with Jae Dire (team pharmacist) and upon looking further, we found that the prescriber put a note under the prescription about dose decrease, I informed the patient, he verbalized understanding to continue as prescribed and fu with his cardiologist.MEDICATION CHANGES:Validated NEW medications to take: N/AValidated Changed medications to take: YesValidated Stopped medications to NOT take: YesIssues obtaining prescriptions: No FOLLOW-UP APPOINTMENTS and TRANSPORTATION:Patient aware of scheduled appointments: YesAwareness and assistance with appointments needing to be scheduled: NoTransportation concerns for follow-up appointment: NoDME and HOME HEALTH SERVICES:Durable medical equipment received: N/AContact has been made with home care agency: N/APlan established for follow up labs/tests: N/AOther follow up items: naADDITIONAL PATIENT NEEDS:Identified Social Determinants of Health opportunities: noAdditional patient needs addressed: no

## 2022-07-08 NOTE — Progress Notes
HPIGarrett Lacretia Nicks Bradley is a 66 y.o. male for evaluation of a left scrotal lesion.First noted a few months ago.  He states it felt like a cystic lesion in the left hemiscrotum.  It is shrunk and has resolved over the past few months.  No drainage that he notes.  No other associated symptoms.History of erectile dysfunction.  Previously treated in [place].  Has tried oral therapies including Cialis 5 milligrams daily and Viagra 100 milligrams p.r.n..  He was instructed in penile self injections while in [place] but never used the injections.He has a history of cardiac amyloidosis, coronary artery disease and type 2 diabetes.  He is on [place].Recent PSA Results:No flowsheet data found.Past Medical HistoryPast Medical History: Diagnosis Date ? Arrhythmia  ? Chronic coronary artery disease  ? Diabetes mellitus (HC Code)  ? Encounter for blood transfusion  ? GERD (gastroesophageal reflux disease)  ? Hypercholesteremia  ? Hypertension  ? Obstructive sleep apnea  ? Syncope, psychogenic  Past Surgical HistoryPast Surgical History: Procedure Laterality Date ? CERVICAL DISC ARTHROPLASTY   ? TOOTH EXTRACTION   ? VALVE REPLACEMENT   AllergiesAllergies Allergen Reactions ? Penicillin G Swelling ? Atorvastatin  MedicationsOutpatient Encounter Medications as of 07/08/2022 Medication Sig Dispense Refill ? allopurinoL (ZYLOPRIM) 300 mg tablet Take 1 tablet (300 mg total) by mouth daily. Take 1 and 1/2 tabs daily. 30 tablet 11 ? ammonium lactate (AMLACTIN) 12 % cream    ? JARDIANCE 10 mg tablet Take 1 tablet (10 mg total) by mouth daily before breakfast.   ? magnesium oxide (MAG-OX) 400 mg (241.3 mg magnesium) tablet Take 1 tablet (400 mg total) by mouth daily.   ? midodrine (PROAMATINE) 10 mg tablet Take 1 tablet (10 mg total) by mouth 3 (three) times daily with meals.   ? ofloxacin (OCUFLOX) 0.3 % ophthalmic solution Place 1 drop into the right eye daily.   ? ONETOUCH VERIO test strips    ? prednisoLONE acetate (PRED FORTE) 1 % ophthalmic suspension Place 1 drop into the right eye 2 (two) times daily.   ? rivaroxaban (XARELTO) 15 mg tablet Take 1 tablet (15 mg total) by mouth Daily @1700 . 30 tablet 1 ? rosuvastatin (CRESTOR) 5 MG tablet Take 1 tablet (5 mg total) by mouth nightly.   ? spironolactone (ALDACTONE) 25 mg tablet Take 0.5 tablets (12.5 mg total) by mouth daily.   ? tafamidis (VYNDAMAX) 61 mg capsule Take 1 capsule (61 mg total) by mouth daily.   ? torsemide (DEMADEX) 20 mg tablet Take 0.5 tablets (10 mg total) by mouth daily as needed. Take torsemide only if weight gain > 3 pounds from your baseline. If at your baseline weight, do not take the torsemide.   ? vutrisiran (AMVUTTRA) 25 mg/0.5 mL Syrg syringe Inject 25 mg under the skin every 3 (three) months.   ? ZOLMitriptan (ZOMIG) 5 mg tablet Take 1 tablet (5 mg total) by mouth as needed for migraine. (Patient not taking: Reported on 07/08/2022)   ? [DISCONTINUED] telmisartan (MICARDIS) 40 mg tablet Take 40 mg by mouth daily.   No facility-administered encounter medications on file as of 07/08/2022.  Review of SystemsConstitutional: no fever, chills, weight lossRespiratory: no wheezing, SOBCardiac: no CPGU: See HPIPhysical ExamHt 6' 2 (1.88 m)  - Wt 73.5 kg  - BMI 20.80 kg/m? Constitutional:  Well developed, well nourished, no acute distress.  Respiratory: Normal respiratory effort.  Neurological: Patient appears to be well oriented to time, place, and person.   Psych: Mood/affect is appropriate.GENITOURINARYScrotum:  Testes normal bilaterally with no mass or tenderness.  Paratesticular structures likewise are normal.  I did not identify any lesions in the scrotal skin.  No cystic lesions.  No evidence of infection.Lab Results Component Value Date  UCOLOR yellow 07/08/2022  UGLUCOSE Negative 07/08/2022 UKETONE Negative 07/08/2022  USPECGRAVITY 1.010 07/08/2022  UPROTEIN Trace 07/08/2022  UNITRATES Negative 07/08/2022  UBLOOD Trace 07/08/2022  ULEUKOCYTES Negative 07/08/2022   No results found for: PVRPOCTAssessment:Encounter Diagnoses Name SNOMED [place](R) Primary? ? Scrotal mass SCROTAL MASS Yes ? Disease of urinary tract DISORDER OF URINARY TRACT  Plan:Scrotal lesion.  Most likely a small epidermal cyst which has now resolved.  Normal exam.  Reassured.  Continue self exams and report back any changes.Erectile dysfunction.  Continue oral therapies p.r.n..Orders Placed This Encounter Procedures ? POC urinalysis manual w/o scope Return if symptoms worsen or fail to improve.Patient will let us know if any new problems or symptoms arise in the interim.Lorin Picket. Hetty Blend, MDYale [phone]

## 2022-07-08 NOTE — Progress Notes
07/08/22 4:28 PMThis pharmacist was contacted by [facility] Navigation RN, Vonna Kotyk, for assistance with a post discharge medication reconciliation issue/question . The patient was not contacted by this pharmacist.Per RN, Pt's rivaroxaban Rx was decreased from 20mg  to 15mg  and is wondering why adjustment was made. Reviewed chart with RN -- advised likely reason for dose adjustment was renal function (last CrCl 07/06/22 41 mL/min). Dose adjustments for Xarelto recommended if CrCl < 50 mL/min for Afib indications. Also noted pharmacy ivent from 9/13 confirming the above (dose adjustment discussed w/ Francesca Jewett, PharmD and Dr. Murrell Redden, dose reduced accordingly).  Informed RN of the above finding, RN to educate Pt.Majel Homer, PharmDClinical PharmacistYNHHS Care Navigation

## 2022-07-11 ENCOUNTER — Other Ambulatory Visit: Payer: Self-pay | Admitting: Interventional Cardiology

## 2022-07-11 ENCOUNTER — Encounter: Admit: 2022-07-11 | Payer: PRIVATE HEALTH INSURANCE | Attending: School | Primary: Internal Medicine

## 2022-07-11 DIAGNOSIS — I5032 Chronic diastolic (congestive) heart failure: Secondary | ICD-10-CM

## 2022-07-11 NOTE — Progress Notes
SOCIAL WORK NOTEPatient Name: Frank Bradley Record Number: [id number] Date of Birth: [birth year]Medical Social Work Follow Up  [employer] Most Recent Value Admission Information  Document Type Progress Note Reason for Current Social Work Involvement Resources Source of Information Patient Record Reviewed Yes Level of Care Ambulatory Psychosocial issues requiring intervention [name] reached out to me Psychosocial interventions [name] reached out to me regarding questions about the [facility] support group. I provided him information and sent him the link. He shared some concerns regarding scabs on his skin. I advised him to call his Primary Care Provider (PCP). He expressed intention to do so. Collaborations N/A Specific referrals to enhance community supports (include existing and new resources) PCP Handoff Required? No Next Steps/Plan (including hand-off): Social work intervention complete. I will remain available to [name]. Signature: Tillman Sers, LCSW Contact Information: [phone]

## 2022-07-14 ENCOUNTER — Telehealth: Admit: 2022-07-14 | Payer: PRIVATE HEALTH INSURANCE | Attending: Cardiovascular Disease | Primary: Internal Medicine

## 2022-07-14 DIAGNOSIS — E851 Neuropathic heredofamilial amyloidosis: Secondary | ICD-10-CM

## 2022-07-14 DIAGNOSIS — G629 Polyneuropathy, unspecified: Secondary | ICD-10-CM

## 2022-07-14 NOTE — Telephone Encounter
Pt wants to know can he get a referral to see a gastrologists he mention he is still loosing weight and feels he needs to see one. Also he wanted to know does Dr.Miller have a particular gastrologist he works with for his patients with Amyloidosis

## 2022-07-15 ENCOUNTER — Other Ambulatory Visit: Payer: Self-pay | Admitting: Interventional Cardiology

## 2022-07-15 ENCOUNTER — Telehealth: Payer: Self-pay | Admitting: Interventional Cardiology

## 2022-07-15 ENCOUNTER — Other Ambulatory Visit (HOSPITAL_COMMUNITY): Payer: Self-pay | Admitting: Cardiology

## 2022-07-15 ENCOUNTER — Other Ambulatory Visit (HOSPITAL_COMMUNITY): Payer: Self-pay

## 2022-07-15 ENCOUNTER — Other Ambulatory Visit: Payer: Self-pay | Admitting: *Deleted

## 2022-07-15 DIAGNOSIS — I5032 Chronic diastolic (congestive) heart failure: Secondary | ICD-10-CM

## 2022-07-15 MED ORDER — EMPAGLIFLOZIN 10 MG PO TABS: 10.0000 mg | ORAL_TABLET | Freq: Every day | ORAL | 0 refills | Status: DC

## 2022-07-15 NOTE — Discharge Summary
[facility] SummaryPatient Data:  Patient Name: Frank Bradley Admit date: 07/05/22 Age: 66 y.o. Discharge date:  07/06/2022 DOB: 1956-03-29	 Discharge Attending Physician: Lear Ng, MD  MRN: [id number]	 Discharged Condition: stable PCP: [name], MD Disposition: Home  Principal Diagnosis:  Chest painSecondary Diagnoses:  Coronary artery disease with 1 vessel CABG (lima to LAD).  Severe aortic stenosis status post bioprosthetic aortic valve replacement.  Cardiac amyloidosis.  Paroxysmal atrial fibrillation/flutter status post ablation but with recurrent paroxysmal atrial fibrillation, chronically anticoagulated with rivaroxaban.  Last echocardiogram from May 2023 revealed severe LVH, moderate LV systolic dysfunction with EF of 38%, pulmonary artery systolic pressure 44 mmHg, dilated RV with decreased RV function, severe TR. Hypertension Hyperlipidemia.  Diabetes mellitus.  Obstructive sleep apnea.  Chronic kidney disease stage 3.Follow-up Information:Frank Bradley, MD[address] [phone] an appointment as soon as possible for a visit in 2 week(s)Your cardiologistSchedule an appointment as soon as possible for a visit in 1 week(s) Future Appointments Date Time Provider Department Center 08/23/2022 11:00 AM [name], MD HEART FAIL [facility] 08/25/2022 11:00 AM [name], RN [facility] 12/01/2022  1:45 PM [name], Sunday Corn, MD MUSCULAR [facility] Hospital Course: The patient is a 66 year old male with extensive cardiac history who presents with chest discomfort starting the day prior to admission.  He describes it as an ache in his upper chest which is worse with movement.  His chest wall is tender with palpation.  EKG showed no acute ischemic changes. Troponins were mildly elevated but flat, not consistent with acute coronary syndrome, likely in the setting of chronic kidney disease.  Chest x-ray showed no acute abnormality.  He was not in congestive heart failure.  Cardiac catheterization a year ago revealed patent LIMA to LAD and otherwise non flow-limiting CAD.  His current pain is clearly musculoskeletal.  He is otherwise medically stable for discharge.Pertinent lab findings and test results: Objective: Recent Labs Lab 09/07/230616 09/12/232115 09/13/230531 WBC 3.4* 5.4 4.4 HGB 12.2* 13.7 12.1* HCT 36.50* 41.50 37.10* PLT 237 282 239  Recent Labs Lab 09/07/230616 09/12/232115 09/13/230531 NEUTROPHILS 51.4 54.2 50.5  Recent Labs Lab 09/12/232115 09/12/232218 09/13/230531 09/13/230750 NA 121* 131* 129*  --  K  --  4.6 4.1  --  CL 89* 98 98  --  CO2 24 23 22   --  BUN 45* 44* 49*  --  CREATININE 1.79* 1.71* 1.78*  --  GLU 148* 119* 96 91 ANIONGAP 8 10 9   --   Recent Labs Lab 09/12/232115 09/12/232218 09/13/230531 CALCIUM 9.3 9.2 9.3 MG 2.5*  --  2.3 PHOS  --   --  4.1  Recent Labs Lab 09/06/231619 09/12/232218 09/13/230531 ALT 11 8* 8* AST 41* 44* 34 ALKPHOS 123* 113 106 BILITOT 1.1 0.8 0.8  No results for input(s): PTT, LABPROT, INR in the last 168 hours. Imaging: Imaging results last 1 week:  NM Lung Ventilation Perfusion AerosolResult Date: 06/30/2022 Low probability of pulmonary embolism. This result should be interpreted in conjunction with the pretest clinical likelihood for pulmonary embolism. Reported and signed by: [name], MD XR Chest PA or APResult Date: 9/6/2023No acute findings. [facility] Radiology Notify System Classification:  Routine Reported and signed by: [name], MD Diet:  Heart healthy diet Mobility: Highest Level of mobility - ACTUAL: Mobility Level 6, Walk 10+ steps,AM PAC 18-21  Physical Exam Discharge vitals: Temp:  [97.5 ?F (36.4 ?C)-97.9 ?F (36.6 ?C)] 97.5 ?F (36.4 ?C)Pulse: [55-74] 66Resp:  [16-18] 18BP: (  89-121)/(62-72) 109/72SpO2:  [96 %-100 %] 100 %Device (Oxygen Therapy): CPAP Pertinent Findings of Physical Exam: Unremarkable Cognitive Status at Discharge: Alert and Oriented x 3Allergies Allergies Allergen Reactions ? Penicillin G Swelling ? Atorvastatin   Discharge Medications: Current Discharge Medication List  CONTINUE these medications which have CHANGED  Details allopurinoL (ZYLOPRIM) 300 mg tablet Take 1 tablet (300 mg total) by mouth daily. Take 1 and 1/2 tabs daily.Qty: 30 tablet, Refills: 11Start date: 07/06/2022  JARDIANCE 10 mg tablet Take 1 tablet (10 mg total) by mouth daily before breakfast.Start date: 07/06/2022  rivaroxaban (XARELTO) 15 mg tablet Take 1 tablet (15 mg total) by mouth Daily @1700 .Qty: 30 tablet, Refills: 1Start date: 07/06/2022  torsemide (DEMADEX) 20 mg tablet Take 0.5 tablets (10 mg total) by mouth daily as needed. Take torsemide only if weight gain > 3 pounds from your baseline. If at your baseline weight, do not take the torsemide.Start date: 07/06/2022   CONTINUE these medications which have NOT CHANGED  Details ammonium lactate (AMLACTIN) 12 % cream   magnesium oxide (MAG-OX) 400 mg (241.3 mg magnesium) tablet Take 1 tablet (400 mg total) by mouth daily.  midodrine (PROAMATINE) 10 mg tablet Take 1 tablet (10 mg total) by mouth 3 (three) times daily with meals.  prednisoLONE acetate (PRED FORTE) 1 % ophthalmic suspension Place 1 drop into the right eye 2 (two) times daily.  rosuvastatin (CRESTOR) 5 MG tablet Take 1 tablet (5 mg total) by mouth nightly.  spironolactone (ALDACTONE) 25 mg tablet Take 0.5 tablets (12.5 mg total) by mouth daily.  tafamidis (VYNDAMAX) 61 mg capsule Take 1 capsule (61 mg total) by mouth daily.  ofloxacin (OCUFLOX) 0.3 % ophthalmic solution Place 1 drop into the right eye daily.  ONETOUCH VERIO test strips vutrisiran (AMVUTTRA) 25 mg/0.5 mL Syrg syringe Inject 25 mg under the skin every 3 (three) months.  ZOLMitriptan (ZOMIG) 5 mg tablet Take 1 tablet (5 mg total) by mouth as needed for migraine.   STOP taking these medications   metFORMIN (GLUCOPHAGE) 500 MG Immediate Release tablet    telmisartan (MICARDIS) 40 mg tablet    There was at total of approximately 45 minutes spent on the discharge of this patientElectronically Signed:Kobie Matkins Tommi Emery, MD 07/06/2022 9:28 AM

## 2022-07-15 NOTE — Telephone Encounter (Signed)
Opened In error

## 2022-07-15 NOTE — Telephone Encounter (Signed)
Returned call to patient Requested refill on jardiance in 90 day supply Refills sent

## 2022-07-15 NOTE — Telephone Encounter (Signed)
*  STAT* If patient is at the pharmacy, call can be transferred to refill team.   1. Which medications need to be refilled? (please list name of each medication and dose if known) Jnew prescription for Jardiance  2. Which pharmacy/location (including street and city if local pharmacy) is medication to be sent to? [place], [place]  3. Do they need a 30 day or 90 day supply? 90 days and refills- please call asap please- he is out of it

## 2022-07-18 ENCOUNTER — Other Ambulatory Visit (HOSPITAL_COMMUNITY): Payer: Self-pay

## 2022-07-19 NOTE — Telephone Encounter
I placed a referral to GI, Dr. Clare Gandy, who has expertise in GI amyloid

## 2022-08-23 ENCOUNTER — Encounter: Admit: 2022-08-23 | Payer: PRIVATE HEALTH INSURANCE | Attending: Cardiovascular Disease | Primary: Internal Medicine

## 2022-08-23 ENCOUNTER — Ambulatory Visit: Admit: 2022-08-23 | Payer: PRIVATE HEALTH INSURANCE | Attending: Internal Medicine | Primary: Internal Medicine

## 2022-08-23 ENCOUNTER — Ambulatory Visit: Admit: 2022-08-23 | Payer: PRIVATE HEALTH INSURANCE | Attending: Cardiovascular Disease | Primary: Internal Medicine

## 2022-08-23 DIAGNOSIS — E1122 Type 2 diabetes mellitus with diabetic chronic kidney disease: Secondary | ICD-10-CM

## 2022-08-23 DIAGNOSIS — N189 Chronic kidney disease, unspecified: Secondary | ICD-10-CM

## 2022-08-23 DIAGNOSIS — G63 Polyneuropathy in diseases classified elsewhere: Secondary | ICD-10-CM

## 2022-08-23 DIAGNOSIS — I130 Hypertensive heart and chronic kidney disease with heart failure and stage 1 through stage 4 chronic kidney disease, or unspecified chronic kidney disease: Secondary | ICD-10-CM

## 2022-08-23 DIAGNOSIS — Z7901 Long term (current) use of anticoagulants: Secondary | ICD-10-CM

## 2022-08-23 DIAGNOSIS — E1140 Type 2 diabetes mellitus with diabetic neuropathy, unspecified: Secondary | ICD-10-CM

## 2022-08-23 DIAGNOSIS — E854 Organ-limited amyloidosis: Secondary | ICD-10-CM

## 2022-08-23 DIAGNOSIS — Z79899 Other long term (current) drug therapy: Secondary | ICD-10-CM

## 2022-08-23 DIAGNOSIS — Z7984 Long term (current) use of oral hypoglycemic drugs: Secondary | ICD-10-CM

## 2022-08-23 DIAGNOSIS — E851 Neuropathic heredofamilial amyloidosis: Secondary | ICD-10-CM

## 2022-08-23 DIAGNOSIS — G4733 Obstructive sleep apnea (adult) (pediatric): Secondary | ICD-10-CM

## 2022-08-23 DIAGNOSIS — E119 Type 2 diabetes mellitus without complications: Secondary | ICD-10-CM

## 2022-08-23 DIAGNOSIS — I10 Essential (primary) hypertension: Secondary | ICD-10-CM

## 2022-08-23 DIAGNOSIS — I5042 Chronic combined systolic (congestive) and diastolic (congestive) heart failure: Secondary | ICD-10-CM

## 2022-08-23 DIAGNOSIS — K219 Gastro-esophageal reflux disease without esophagitis: Secondary | ICD-10-CM

## 2022-08-23 DIAGNOSIS — F488 Other specified nonpsychotic mental disorders: Secondary | ICD-10-CM

## 2022-08-23 DIAGNOSIS — E7800 Pure hypercholesterolemia, unspecified: Secondary | ICD-10-CM

## 2022-08-23 DIAGNOSIS — Z952 Presence of prosthetic heart valve: Secondary | ICD-10-CM

## 2022-08-23 DIAGNOSIS — Z5189 Encounter for other specified aftercare: Secondary | ICD-10-CM

## 2022-08-23 DIAGNOSIS — I499 Cardiac arrhythmia, unspecified: Secondary | ICD-10-CM

## 2022-08-23 DIAGNOSIS — I2510 Atherosclerotic heart disease of native coronary artery without angina pectoris: Secondary | ICD-10-CM

## 2022-08-23 DIAGNOSIS — I43 Cardiomyopathy in diseases classified elsewhere: Secondary | ICD-10-CM

## 2022-08-23 MED ORDER — ALBUTEROL SULFATE 2.5 MG/3 ML (0.083 %) SOLUTION FOR NEBULIZATION
2.530.083 mg /3 mL (0.083 %) | RESPIRATORY_TRACT | Status: DC | PRN
Start: 2022-08-23 — End: 2022-08-23

## 2022-08-23 MED ORDER — METFORMIN IMMEDIATE RELEASE 500 MG TABLET
500 mg | Freq: Two times a day (BID) | ORAL | Status: AC
Start: 2022-08-23 — End: ?

## 2022-08-23 MED ORDER — DIPHENHYDRAMINE 50 MG/ML INJECTION (WRAPPED E-RX)
50 mg/mL | Freq: Once | INTRAMUSCULAR | Status: DC | PRN
Start: 2022-08-23 — End: 2022-08-23

## 2022-08-23 MED ORDER — DIPHENHYDRAMINE 25 MG CAPSULE
25 mg | Freq: Once | ORAL | Status: DC | PRN
Start: 2022-08-23 — End: 2022-08-23

## 2022-08-23 MED ORDER — EPINEPHRINE 0.3 MG/0.3 ML INJECTION, AUTO-INJECTOR
0.30.3 mg/ mL | INTRAMUSCULAR | Status: DC | PRN
Start: 2022-08-23 — End: 2022-08-23

## 2022-08-23 MED ORDER — HYDROCORTISONE SODIUM SUCCINATE 100 MG SOLUTION FOR INJECTION
100 mg | Freq: Once | INTRAMUSCULAR | Status: DC | PRN
Start: 2022-08-23 — End: 2022-08-23

## 2022-08-23 MED ORDER — VUTRISIRAN 25 MG/0.5 ML SUBCUTANEOUS SYRINGE
25 mg/0.5 mL | Freq: Once | SUBCUTANEOUS | Status: CP
Start: 2022-08-23 — End: ?
  Administered 2022-08-23: 16:00:00 25 mL via SUBCUTANEOUS

## 2022-08-23 MED ORDER — SODIUM CHLORIDE 0.9 % BOLUS (NEW BAG)
0.9 % | Freq: Once | INTRAVENOUS | Status: DC | PRN
Start: 2022-08-23 — End: 2022-08-23

## 2022-08-23 NOTE — Progress Notes
[facility] of MedicineSection of Cardiovascular MedicineYale Cardiomyopathy ProgramReferring Provider:Referral MDDiagnosis:Primary cardiologist:PCP:Chief Complaint:History of Present Illness: Frank Bradley is a 66 y.o. 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  His shortness of breath is stable.  No further edema.  No hospitalizations.  He is moving back to [place] very soon.PMH:He was hospitalized May 2023 after moving here from [place].  He was previously seen at the [facility].  He presented with exertional dyspnea.  He was diuresed and became hypotensive.  He required transient ICU evaluation.  He presents today for his 1st clinic visit.  He is taking his medications and feels at his baseline but he does have significant exertional dyspnea.?He is previously received the majority of his cardiac care in [place].  His most recent echo had been performed in February 2023.  At that time his EF was 45-50%.  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  He states he is also on a 2nd amyloid medication, but is unsure of the name.  He is previously been noted to have orthostatic hypotension and was on midodrine 5 mg t.i.d. since December.  He states he also had EMG testing which was abnormal.?He recently moved to [place] and was about to establish care at [facility] in infiltrative cardiomyopathy Clinic, but developed worsening shortness of breath on exertion even with walking to the bathroomAmyloid Specific Clinical History:	N/A Ne CTSRuptured Biceps Tendon: NoSpinal Stenosis: YesNeuropathy CA: Autonomic and PeripheralFamily history of amyloidosis: NoFamily history of unexplained neuropathy: YesOrthostatic hypotension: YesBiopsy proven Cardiac Amyloidosis: NoECG suggestive of CA: YesEcho suggestive of CA: YesMRI suggestive of CA: YesFamily history of CA: NoHeart failure CA: HFrEFAortic stenosis: Surgery Atrial fibrillation/ atrial flutterHypertension: YesHypotension: YesCKD: YesESRD: NoH/o MI: NoACEi intolerance: YesBB intolerance: YesPast Medical History:- Past Medical History: Diagnosis Date ? Arrhythmia  ? Chronic coronary artery disease  ? Diabetes mellitus (HC Code)  ? Encounter for blood transfusion  ? GERD (gastroesophageal reflux disease)  ? Hypercholesteremia  ? Hypertension  ? Obstructive sleep apnea  ? Syncope, psychogenic  Past Surgical History:- Past Surgical History: Procedure Laterality Date ? CERVICAL DISC ARTHROPLASTY   ? TOOTH EXTRACTION   ? VALVE REPLACEMENT   Family History:- Family History Problem Relation Age of Onset ? Heart disease Mother  ? Diabetes Mother  ? Stroke Mother  ? Heart failure Mother  ? Heart disease Father  ? Heart attack Father  ? Diabetes Sister  ? Diabetes Brother  Social History:- Social History Tobacco Use ? Smoking status: Former   Current packs/day: 0.00   Types: Cigarettes ? Smokeless tobacco: Never ? Tobacco comments:   Quit early 1980's Vaping Use ? Vaping Use: Never used Substance Use Topics ? Alcohol use: Not Currently ? Drug use: No  Race: BlackEthnicity: BlackFinancial insecurity:NoFood insecurity: NoTransportation barriers: NoHome medications: Patient's Medications New Prescriptions  No medications on file Previous Medications  ALLOPURINOL (  ZYLOPRIM) 300 MG TABLET    Take 1 tablet (300 mg total) by mouth daily. Take 1 and 1/2 tabs daily.  AMMONIUM LACTATE (AMLACTIN) 12 % CREAM      JARDIANCE 10 MG TABLET    Take 1 tablet (10 mg total) by mouth daily before breakfast.  MAGNESIUM OXIDE (MAG-OX) 400 MG (241.3 MG MAGNESIUM) TABLET    Take 1 tablet (400 mg total) by mouth daily.  MIDODRINE (PROAMATINE) 10 MG TABLET    Take 1 tablet (10 mg total) by mouth 3 (three) times daily with meals.  OFLOXACIN (OCUFLOX) 0.3 % OPHTHALMIC SOLUTION    Place 1 drop into the right eye daily.  ONETOUCH VERIO TEST STRIPS      PREDNISOLONE ACETATE (PRED FORTE) 1 % OPHTHALMIC SUSPENSION    Place 1 drop into the right eye 2 (two) times daily.  RIVAROXABAN (XARELTO) 15 MG TABLET    Take 1 tablet (15 mg total) by mouth Daily @1700 .  ROSUVASTATIN (CRESTOR) 5 MG TABLET    Take 1 tablet (5 mg total) by mouth nightly.  SPIRONOLACTONE (ALDACTONE) 25 MG TABLET    Take 0.5 tablets (12.5 mg total) by mouth daily.  TAFAMIDIS (VYNDAMAX) 61 MG CAPSULE    Take 1 capsule (61 mg total) by mouth daily.  TORSEMIDE (DEMADEX) 20 MG TABLET    Take 0.5 tablets (10 mg total) by mouth daily as needed. Take torsemide only if weight gain > 3 pounds from your baseline. If at your baseline weight, do not take the torsemide.  VUTRISIRAN (AMVUTTRA) 25 MG/0.5 ML SYRG SYRINGE    Inject 25 mg under the skin every 3 (three) months.  ZOLMITRIPTAN (ZOMIG) 5 MG TABLET    Take 1 tablet (5 mg total) by mouth as needed for migraine. Modified Medications  No medications on file Discontinued Medications  No medications on file Review of Systems: ROSPhysical Exam: Vital Signs : No data recorded There were no vitals filed for this visit. Physical ExamConstitutional:     General: He is not in acute distress.   Appearance: He is ill-appearing. HENT:    Head: Normocephalic.    Nose: Nose normal.    Mouth/Throat:    Mouth: Mucous membranes are moist. Eyes:    Pupils: Pupils are equal, round, and reactive to light. Cardiovascular:    Rate and Rhythm: Normal rate.    Heart sounds: Normal heart sounds. Pulmonary:    Effort: Pulmonary effort is normal. Abdominal:    General: Abdomen is flat. Musculoskeletal:       General: No swelling. Psychiatric:       Mood and Affect: Mood normal.       Thought Content: Thought content normal. Labs: Labs:@LABRCNTIP (wbc:3,hgb,plt)@ Kappa Free Light Chains, Serum (mg/dL) Date Value 16/07/9603 6.83 (H) Lambda Free Light Chains, Serum (mg/dL) Date Value 54/06/8118 3.55 (H) Kappa/Lambda Free Light Chains Ratio, Serum (no units) Date Value 03/08/2022 1.92 (H)  @LABRCNTIP (na:3,k,cl,co2,BUN,creatinine,glu,aniongap)@ @LABRCNTIP (calcium:3,mg,phos,PREALBUMIN)@ No results found for: TROPONINTLab Results Component Value Date  BNPPRO 1,789.0 (H) 07/05/2022  BNPPRO 1,896.0 (H) 06/29/2022  BNPPRO 2,249.0 (H) 03/08/2022  Lab Results Component Value Date  LABPROT 14.4 (H) 10/08/2016  INR 1.48 (H) 10/08/2016  Imaging: Echo :Results for orders placed or performed during the hospital encounter of 02/22/22 Echo 2D Ltd w Doppler and CFI if Ind Image Enhancement and or 3D Result Value Ref Range  Reported Biplane EF% 38 %  Narrative  ~ * Limited Echocardiogram* Moderately decreased left ventricular systolic function. The left ventricular ejection fraction calculated by biplane  Simpson's was 38%.  No thrombus visualized in the left ventricle. Moderately increased left ventricular cavity size.  Severe concentric left ventricular hypertrophy.  Flattened septum in systole consistent with right ventricular pressure overload.* Moderately increased right ventricular cavity size.  Mildly decreased right ventricular systolic function.  Estimated right ventricular systolic pressure is 44 mmHg compatible with pulmonary hypertension. The right ventricular systolic pressure may be underestimated.* A bioprosthetic aortic valve is present.  Spectral doppler was not performed across the aortic valve on this study.* Mild mitral regurgitation.* Severe tricuspid regurgitation.* Hepatic vein flow shows systolic flow reversal.  IVC diameter > 2.1 cm that collapses < 50% with a sniff suggests high RAP (10-20 mmHg, mean 15 mmHg).* No significant pericardial effusion.* No prior transthoracic study available for comparison. PYP :No results found for this or any previous visit.MRI :EKG: Assessment and Plan: Frank Bradley is a 66 y.o. 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  He has NYHA class 3 CHF symptoms.  He has polyneuropathy symptoms with possible autonomic dysfunction and peripheral neuropathy.  He is currently taking TTR stabilizers and TTR suppressing agents.  He was recently hospitalized for acute on chronic systolic and diastolic heart failure with diuresis.  Today he looks stable.  His exertional dyspnea is stable and NYHA class 3.  We did a cardiopulmonary test which showed a VO2 max of 9 but he is unable to reach anaerobic threshold.  This suggests deconditioning.  He has moved back to [place].  He will get his last am future injection with [place] today.  He is transitioning his care back to [facility] [place].  Given his weight loss in his swallowing difficulties, I do recommend he sees a gastroenterologist down there as well.  I am happy to help with his care should he move back to the [place] area or need anything in the interimOrgan involvement: Organ involvement: Cardiac, Neuropathy - Autonomic and Neuropathy - PeripheralNYHA class: 3Genetic testing: Genetic Testing [place]: Saunders Revel, MD PhDAssociate Professor of Medicine & Radiology and Biomedical ImagingCardiology AttendingCardiomyopathy Clinic:  [phone].[email or web address]

## 2022-08-23 NOTE — Progress Notes
Pt here for Amvuttra injection.  Here post visit with Dr. Hyacinth Meeker.  Amvuttra given SQ to left upper arm.

## 2022-08-23 NOTE — Progress Notes
SUBJECTIVE:Frank Bradley is a 66 y.o. male with a PMHx of Familial Amyloid Polyneuropathy. Frank Bradley presents for Amvuttra Treatment. Frank Bradley has baseline complaints of neuropathy.Medical History:Past Medical History: Diagnosis Date ? Arrhythmia  ? Chronic coronary artery disease  ? Diabetes mellitus (HC Code)  ? Encounter for blood transfusion  ? GERD (gastroesophageal reflux disease)  ? Hypercholesteremia  ? Hypertension  ? Obstructive sleep apnea  ? Syncope, psychogenic  Current Outpatient Medications on File Prior to Visit Medication Sig Dispense Refill ? allopurinoL (ZYLOPRIM) 300 mg tablet Take 1 tablet (300 mg total) by mouth daily. Take 1 and 1/2 tabs daily. 30 tablet 11 ? ammonium lactate (AMLACTIN) 12 % cream    ? JARDIANCE 10 mg tablet Take 1 tablet (10 mg total) by mouth daily before breakfast.   ? magnesium oxide (MAG-OX) 400 mg (241.3 mg magnesium) tablet Take 1 tablet (400 mg total) by mouth daily.   ? midodrine (PROAMATINE) 10 mg tablet Take 1 tablet (10 mg total) by mouth 3 (three) times daily with meals.   ? ofloxacin (OCUFLOX) 0.3 % ophthalmic solution Place 1 drop into the right eye daily.   ? ONETOUCH VERIO test strips    ? prednisoLONE acetate (PRED FORTE) 1 % ophthalmic suspension Place 1 drop into the right eye 2 (two) times daily.   ? rivaroxaban (XARELTO) 15 mg tablet Take 1 tablet (15 mg total) by mouth Daily @1700 . 30 tablet 1 ? rosuvastatin (CRESTOR) 5 MG tablet Take 1 tablet (5 mg total) by mouth nightly.   ? spironolactone (ALDACTONE) 25 mg tablet Take 0.5 tablets (12.5 mg total) by mouth daily.   ? tafamidis (VYNDAMAX) 61 mg capsule Take 1 capsule (61 mg total) by mouth daily.   ? torsemide (DEMADEX) 20 mg tablet Take 0.5 tablets (10 mg total) by mouth daily as needed. Take torsemide only if weight gain > 3 pounds from your baseline. If at your baseline weight, do not take the torsemide.   ? vutrisiran (AMVUTTRA) 25 mg/0.5 mL Syrg syringe Inject 25 mg under the skin every 3 (three) months.   ? ZOLMitriptan (ZOMIG) 5 mg tablet Take 1 tablet (5 mg total) by mouth as needed for migraine. (Patient not taking: Reported on 07/08/2022)   ? [DISCONTINUED] telmisartan (MICARDIS) 40 mg tablet Take 40 mg by mouth daily.   No current facility-administered medications on file prior to visit. Allergies Allergen Reactions ? Penicillin G Swelling ? Atorvastatin  Objective:There were no vitals filed for this visit.Wt: 07/08/22 73.5 kg 07/06/22 75.2 kg 06/30/22 74 kg   Chemistry  Lab Results Component Value Date  NA 129 (L) 07/06/2022  K 4.1 07/06/2022  CL 98 07/06/2022  CO2 22 07/06/2022  BUN 49 (H) 07/06/2022  CREATININE 1.78 (H) 07/06/2022  GLU 150 (H) 07/06/2022  Lab Results Component Value Date  CALCIUM 9.3 07/06/2022  ALKPHOS 106 07/06/2022  AST 34 07/06/2022  ALT 8 (L) 07/06/2022  BILITOT 0.8 07/06/2022  Physical Exam Constitutional: He appears healthy. No distress. Neck: No JVD present. Cardiovascular: Normal rate and normal heart sounds. A regularly irregular rhythm present. Pulmonary/Chest: Breath sounds normal. Abdominal: Soft. He exhibits no distension. There is no abdominal tenderness. Musculoskeletal:       General: No edema. Neurological: He is alert and oriented to person, place, and time. Skin: Skin is warm and dry. Assessment and Plan:Frank Bradley is a 66 y.o. male with a PMHx of Familial Amyloid Polyneuropathy. Frank Bradley received Amvuttra (vutrisiran) 25 mg. Frank Bradley tolerated infusion well, denying flushing, back pain, nausea, abdominal pain, dyspnea and headache. Familial Amyloid PolyneuropathyGarrett Lacretia Nicks Bradley will return in three weeks time for ongoing treatment.Cardiology 6 month Follow Up due: May 2024Neurology 6 month Follow Up due: November 2023Ophthalmology Follow Up: Outside providerVitamin A dose: MVIElectronically Signed by Evonnie Pat, APRN, August 23, 2022

## 2022-08-25 ENCOUNTER — Ambulatory Visit: Admit: 2022-08-25 | Payer: PRIVATE HEALTH INSURANCE | Primary: Internal Medicine

## 2022-09-08 ENCOUNTER — Other Ambulatory Visit (HOSPITAL_COMMUNITY): Payer: Self-pay

## 2022-09-08 ENCOUNTER — Other Ambulatory Visit (HOSPITAL_COMMUNITY): Payer: Self-pay | Admitting: Pharmacist

## 2022-09-08 MED ORDER — VYNDAMAX 61 MG PO CAPS
1.0000 | ORAL_CAPSULE | Freq: Every day | ORAL | 3 refills | Status: DC
  Filled 2022-09-08: qty 90, 90d supply, fill #0
  Filled 2022-11-25: qty 90, 90d supply, fill #1
  Filled 2023-02-28: qty 90, 90d supply, fill #2
  Filled 2023-06-23 (×2): qty 90, 90d supply, fill #3

## 2022-09-13 ENCOUNTER — Other Ambulatory Visit (HOSPITAL_COMMUNITY): Payer: Self-pay

## 2022-09-14 ENCOUNTER — Other Ambulatory Visit (HOSPITAL_COMMUNITY): Payer: Self-pay | Admitting: Internal Medicine

## 2022-09-21 ENCOUNTER — Ambulatory Visit: Payer: Medicare Other | Admitting: Adult Health

## 2022-10-20 ENCOUNTER — Ambulatory Visit (HOSPITAL_COMMUNITY)
Admission: RE | Admit: 2022-10-20 | Discharge: 2022-10-20 | Disposition: A | Discharge status: Home / Self Care | Payer: Medicare HMO | Source: Ambulatory Visit | Attending: Internal Medicine | Admitting: Internal Medicine

## 2022-10-20 ENCOUNTER — Encounter (HOSPITAL_COMMUNITY): Payer: Self-pay | Admitting: Internal Medicine

## 2022-10-20 VITALS — BP 98/60 | HR 74 | Wt 166.8 lb

## 2022-10-20 DIAGNOSIS — Z953 Presence of xenogenic heart valve: Secondary | ICD-10-CM | POA: Insufficient documentation

## 2022-10-20 DIAGNOSIS — Z955 Presence of coronary angioplasty implant and graft: Secondary | ICD-10-CM | POA: Diagnosis not present

## 2022-10-20 DIAGNOSIS — I480 Paroxysmal atrial fibrillation: Secondary | ICD-10-CM | POA: Diagnosis not present

## 2022-10-20 DIAGNOSIS — E1122 Type 2 diabetes mellitus with diabetic chronic kidney disease: Secondary | ICD-10-CM | POA: Diagnosis not present

## 2022-10-20 DIAGNOSIS — Z79899 Other long term (current) drug therapy: Secondary | ICD-10-CM | POA: Diagnosis not present

## 2022-10-20 DIAGNOSIS — N1831 Chronic kidney disease, stage 3a: Secondary | ICD-10-CM | POA: Diagnosis not present

## 2022-10-20 DIAGNOSIS — Z7901 Long term (current) use of anticoagulants: Secondary | ICD-10-CM | POA: Insufficient documentation

## 2022-10-20 DIAGNOSIS — G4733 Obstructive sleep apnea (adult) (pediatric): Secondary | ICD-10-CM | POA: Insufficient documentation

## 2022-10-20 DIAGNOSIS — I43 Cardiomyopathy in diseases classified elsewhere: Secondary | ICD-10-CM

## 2022-10-20 DIAGNOSIS — Z7984 Long term (current) use of oral hypoglycemic drugs: Secondary | ICD-10-CM | POA: Insufficient documentation

## 2022-10-20 DIAGNOSIS — Z952 Presence of prosthetic heart valve: Secondary | ICD-10-CM

## 2022-10-20 DIAGNOSIS — Z951 Presence of aortocoronary bypass graft: Secondary | ICD-10-CM | POA: Diagnosis not present

## 2022-10-20 DIAGNOSIS — I5032 Chronic diastolic (congestive) heart failure: Secondary | ICD-10-CM | POA: Insufficient documentation

## 2022-10-20 DIAGNOSIS — I130 Hypertensive heart and chronic kidney disease with heart failure and stage 1 through stage 4 chronic kidney disease, or unspecified chronic kidney disease: Secondary | ICD-10-CM | POA: Diagnosis not present

## 2022-10-20 DIAGNOSIS — I2510 Atherosclerotic heart disease of native coronary artery without angina pectoris: Secondary | ICD-10-CM | POA: Diagnosis not present

## 2022-10-20 DIAGNOSIS — I951 Orthostatic hypotension: Secondary | ICD-10-CM | POA: Insufficient documentation

## 2022-10-20 DIAGNOSIS — E854 Organ-limited amyloidosis: Secondary | ICD-10-CM | POA: Diagnosis not present

## 2022-10-20 LAB — COMPREHENSIVE METABOLIC PANEL
ALT: 11 U/L (ref 0–44)
AST: 43 U/L — ABNORMAL HIGH (ref 15–41)
Albumin: 3.9 g/dL (ref 3.5–5.0)
Alkaline Phosphatase: 99 U/L (ref 38–126)
Anion gap: 9 (ref 5–15)
BUN: 48 mg/dL — ABNORMAL HIGH (ref 8–23)
CO2: 24 mmol/L (ref 22–32)
Calcium: 9.8 mg/dL (ref 8.9–10.3)
Chloride: 98 mmol/L (ref 98–111)
Creatinine, Ser: 1.78 mg/dL — ABNORMAL HIGH (ref 0.61–1.24)
GFR, Estimated: 41 mL/min — ABNORMAL LOW (ref >60)
Glucose, Bld: 107 mg/dL — ABNORMAL HIGH (ref 70–99)
Potassium: 4.9 mmol/L (ref 3.5–5.1)
Sodium: 131 mmol/L — ABNORMAL LOW (ref 135–145)
Total Bilirubin: 1.6 mg/dL — ABNORMAL HIGH (ref 0.3–1.2)
Total Protein: 8.3 g/dL — ABNORMAL HIGH (ref 6.5–8.1)

## 2022-10-20 LAB — CBC
HCT: 37.6 % — ABNORMAL LOW (ref 39.0–52.0)
Hemoglobin: 12 g/dL — ABNORMAL LOW (ref 13.0–17.0)
MCH: 27.1 pg (ref 26.0–34.0)
MCHC: 31.9 g/dL (ref 30.0–36.0)
MCV: 85.1 fL (ref 80.0–100.0)
Platelets: 274 10*3/uL (ref 150–400)
RBC: 4.42 MIL/uL (ref 4.22–5.81)
RDW: 17.5 % — ABNORMAL HIGH (ref 11.5–15.5)
WBC: 4.4 10*3/uL (ref 4.0–10.5)
nRBC: 0 % (ref 0.0–0.2)

## 2022-10-20 LAB — BRAIN NATRIURETIC PEPTIDE: B Natriuretic Peptide: 623.3 pg/mL — ABNORMAL HIGH (ref 0.0–100.0)

## 2022-10-20 NOTE — Patient Instructions (Signed)
We will reach out to the company to get your Amuttra set up here.  Labs done today, your results will be available in MyChart, we will contact you for abnormal readings.  Your physician recommends that you schedule a follow-up appointment in: 3 months  If you have any questions or concerns before your next appointment please send [place] a message through [place] or call our office at [phone].    TO LEAVE A MESSAGE FOR THE NURSE SELECT OPTION 2, PLEASE LEAVE A MESSAGE INCLUDING: YOUR NAME DATE OF BIRTH CALL BACK NUMBER REASON FOR CALL**this is important as we prioritize the call backs  YOU WILL RECEIVE A CALL BACK THE SAME DAY AS LONG AS YOU CALL BEFORE 4:00 PM  At the [facility], you and your health needs are our priority. As part of our continuing mission to provide you with exceptional heart care, we have created designated Provider Care Teams. These Care Teams include your primary Cardiologist (physician) and Advanced Practice Providers (APPs- Physician Assistants and Nurse Practitioners) who all work together to provide you with the care you need, when you need it.   You may see any of the following providers on your designated Care Team at your next follow up: Dr Glori Bickers Dr Loralie Champagne Dr. Roxana Hires, NP Lyda Jester, [place] [facility] [place], [place] Forestine Na, NP Audry Riles, PharmD   Please be sure to bring in all your medications bottles to every appointment.

## 2022-10-20 NOTE — Progress Notes (Signed)
ADVANCED HF CLINIC NOTE  Primary Care: [name], MD Primary Cardiologist: Larae Grooms, MD HF Cardiologist: Dr. Haroldine Laws [facility]: Dr Radene Knee  HPI: Frank Berg is a 66 y.o. male referred by Dr. Irish Lack for further evaluation of his HF.   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 Also has h/o AF s/p AF ablation at [facility] in 2017. CAD s/p previous stent (2008) followed by LIMA to LAD at time of AVR. TTR Amyloid/   Admitted in 4/22 with near syncope. EF 45% by echo. RV mildly HK. RVSP 71mHG   R/L cath 5/22: mLAD 90% (patent LIMA to LAD), LCX and RCA minimal CAD.  RA 9 RV 24/0 PA 25/8 (15) PCWP 5 Fick 3.4/1.6  We saw him for the first time on 08/23/21 for AHF consult. W/u revealed severe TTR cardiac amyloidosis. Started on tafamadis. CPX with marked HF limitation.  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 Diuresed 14 pounds. D/c weight 171 on Monday. Started on torsemide 40 but hasn't taken it yet.  Echo 12/17/21: EF 45-50% severe LVH RV mod HK.  Follow up 3/23, volume increasing, torsemide 40 mg daily started.   [facility] on [facility]tra in 4/23 by Neurology  [place] [facility] showed pVO2 9.8 but apparently didn't reach AT so felt to be submax test. No RER or VE/VCO2 slope reported. [facility] [place] [place]  Today he returns for HF follow up.Overall feeling fine. Denies SOB/PND/Orthopnea. Having some numbness left hand and 2 fingers on the right. Appetite ok. No fever or chills. He has not been weighing. Taking all medications. Does all ADLs without problem. [place]. No edema, orthopnea or PND. BP ok. Occasional orthostasis.    Cardiac studies:  - CPX test in 6/23 ([facility] showed pVO2 9.8 but apparently didn't reach AT so felt to be submax test. No  RER or VE/VCO2 slope reported.  - Echo (2/23): EF 45-50%, severe LVH, RV moderate HK  - CPX 09/07/21  FVC 3.06  (74%)      FEV1 2.30 (73%)        FEV1/FVC 75 (98%)        MVV 82 (58%)       BP rest: 94/64 Standing BP: 86/64 BP peak: 100/58  Peak VO2: 10.4 (36% predicted peak VO2)  VE/VCO2 slope:  48  OUES: 1.04  Peak RER: 1.09  Ventilatory Threshold: 9.3 (33% predicted or measured peak VO2)  O2pulse:  8   (46% predicted O2pulse  - cMRI 09/22/21 1. Mild LVE with moderate LVH diffuse hypokinesis EF 40% 2. Markedly abnormal post gadolinium images with 41.8% myocardial uptake over all axial slices Although there is a subendocardial and mid myocardial predominance and worse uptake in the basal segments there is also transmural uptake in some areas not contained to a coronary distribution 3. Degenerative appearing Bioprosthetic AVR with mild appearing AR and thickened leaflets 4.  Mild RVE/RV hypokinesis RVEF 46%  - PYP Scan 11/03/20 Ratio 2.8 Markedly positive for TTR    Past Medical History:  Diagnosis Date   Atrial fibrillation (HCC)    Coronary artery disease    Diabetes mellitus without complication ([place]    Diabetic polyneuropathy ([facility]    GERD (  gastroesophageal reflux disease)    Gout, unspecified    HLD (hyperlipidemia)    Hx of adenomatous polyp of colon 12/03/2020   Hypertension    OSA on CPAP    Sleep apnea     Current Outpatient Medications  Medication Sig Dispense Refill   acetaminophen (TYLENOL) 325 MG tablet Take 2 tablets (650 mg total) by mouth every 6 (six) hours as needed for mild pain (or Fever >/= 101). 20 tablet 0   allopurinol (ZYLOPRIM) 300 MG tablet Take 300 mg by mouth daily.     colchicine 0.6 MG tablet Take 0.6 mg by mouth as needed (gout).     doxycycline (VIBRA-TABS) 100 MG tablet Take 1 tablet prior to dental procedures     empagliflozin (JARDIANCE) 10 MG TABS tablet Take 1 tablet (10 mg total) by mouth daily before breakfast. 90 tablet 0    Ensure Max Protein (ENSURE MAX PROTEIN) LIQD Take 330 mLs (11 oz total) by mouth at bedtime. 3330 mL 0   feeding supplement, GLUCERNA SHAKE, (GLUCERNA SHAKE) LIQD Take 237 mLs by mouth 2 (two) times daily between meals. 2370 mL 0   glucose blood (ONETOUCH VERIO) test strip TEST ONCE A DAY DX E11.29     lansoprazole (PREVACID) 15 MG capsule Take 15 mg by mouth as needed.     loratadine (CLARITIN) 10 MG tablet Take 10 mg by mouth daily as needed for allergies.     magnesium oxide (MAG-OX) 400 MG tablet Take 400 mg by mouth daily.     metFORMIN (GLUCOPHAGE) 500 MG tablet Take 1 tablet (500 mg total) by mouth 2 (two) times daily with a meal.     midodrine (PROAMATINE) 5 MG tablet Take 5 mg by mouth 2 (two) times daily with a meal.     Multiple Vitamins-Minerals (CERTAVITE/ANTIOXIDANTS) TABS Take 1 tablet by mouth daily. 30 tablet 0   Rivaroxaban (XARELTO) 15 MG TABS tablet Take 15 mg by mouth daily with supper.     rosuvastatin (CRESTOR) 5 MG tablet Take 5 mg by mouth every evening.     spironolactone (ALDACTONE) 25 MG tablet TAKE 1/2 OF A TABLET BY MOUTH DAILY 45 tablet 3   tadalafil, PAH, (ADCIRCA) 20 MG tablet Take 20 mg by mouth daily as needed (ED).     Tafamidis (VYNDAMAX) 61 MG CAPS Take 1 capsule by mouth daily. 90 capsule 3   torsemide (DEMADEX) 20 MG tablet Take 20 mg by mouth daily.     vutrisiran sodium (AMVUTTRA) 25 MG/0.5ML syringe Inject 25 mg into the skin every 3 (three) months. Last dose was October 2023     No current facility-administered medications for this encounter.   Allergies  Allergen Reactions   Lipitor [Atorvastatin] Other (See Comments)    Body aches   Penicillins Swelling    Swollen face   Penicillin G     Other reaction(s): Unknown   Social History   Socioeconomic History   Marital status: Single    Spouse name: Not on file   Number of children: 2   Years of education: Not on file   Highest education level: Not on file  Occupational History    Occupation: retired  Tobacco Use   Smoking status: Former    Packs/day: 0.25    Years: 10.00    Total pack years: 2.50    Types: Cigarettes    Quit date: 11/03/1981    Years since quitting: 40.9   Smokeless tobacco: Never  Vaping Use  Vaping Use: Never used  Substance and Sexual Activity   Alcohol use: Yes    Comment: occasional   Drug use: No   Sexual activity: Not on file  Other Topics Concern   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   Married 1 son 1 daughter   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   Other jobs including working at the [employer], providing health care for disabled, 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, 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   Social Determinants of Radio broadcast assistant Strain: Not on file  Food Insecurity: Not on file  Transportation Needs: Not on file  Physical Activity: Not on file  Stress: Not on file  Social Connections: Not on file  Intimate Partner Violence: Not on file   Family History  Problem Relation Age of Onset   Congenital heart disease Mother    Diabetes Mother    Hypertension Mother    Heart disease Mother    Heart attack Father    Cervical cancer Sister    Diabetes Sister    Hypertension Brother    Diabetes Brother    Pancreatic cancer Brother    Diabetes Brother    Breast cancer Sister    Diabetes Sister    Diabetes Sister    Diabetes Sister    Diabetes Sister    Diabetes Son    Colon cancer Neg Hx    Colon polyps Neg Hx    Esophageal cancer Neg Hx    Stomach cancer Neg Hx    BP 98/60   Pulse 74   Wt 75.7 kg (166 lb 12.8 oz)   SpO2 100%   BMI 21.42 kg/m    Wt Readings from Last 3 Encounters:  10/20/22 75.7 kg (166 lb 12.8 oz)  01/14/22 77.7 kg (171 lb 3.2 oz)  12/22/21 80.4 kg (177 lb 3.2 oz)    PHYSICAL EXAM: General:  Thin. No resp difficulty HEENT: normal Neck: supple. no JVD. Carotids 2+ bilat; no bruits. No lymphadenopathy or thryomegaly appreciated. Cor: PMI nondisplaced. Regular rate & rhythm. No rubs, gallops or murmurs. Lungs: clear Abdomen: soft, nontender, nondistended. No hepatosplenomegaly. No bruits or masses. Good bowel sounds. Extremities: no cyanosis, clubbing, rash, edema Neuro: alert & orientedx3, cranial nerves grossly intact. moves all 4 extremities w/o difficulty. Affect pleasant   ASSESSMENT & PLAN:   1. 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 RVSP 60mHG with question of mild D-shaped septum - R/L cath 5/22: mLAD 90% (patent LIMA to LAD), LCX and RCA minimal CAD.  RA 9 RV 24/0 PA 25/8 (15) PCWP 5 Fick 3.4/1.6CPX 09/07/21 - CPX 11/22: pVO2 10.4 VE/VCO2 slope:  48, pRER: 1.09  - cMRI 09/22/21: EF 40% RVEF 46%  diffuses LGE and markedly elevated ECW ~71%. degenerative appearing Bioprosthetic AVR with mild AI. Reviewed with imaging team and felt to be c/w advanced cardiac amyloidosis. - PYP 1/22 markedly positive. 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 Diuresed 14 pounds - CPX confirms severe HF limitation - [facility]. Volume status  stable. Continue torsemide 20 mg daily.  - Off bisoprolol. - Continue Jardiance 10 mg daily. - Continue spiro 12.5 mg daily.  - Continue midodrine 5 mg tid + compression hose for orthostasis.  - Not candidate for advanced therapies with cachexia and CKD  2. 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 CAD - S/p previous stent 2008 (unknown vessel) & LIMA to LAD 2011 - No s/s angina - Continue statin. No ASA with Xarelto  4.  Bicuspid  AoV s/p bioprosthetic AVR 2011 - Stable on  echo. - Aware of SBE prophylaxis.  5. PAF - s/p AF ablation 2017 - No recurrence.  - Continue Xarelto. No bleeding.  6. OSA - Compliant with CPAP.  - No change.  7. DM2 - Continue Jardiance.  8. CKD3a - Baseline SCr 1.4-1.6 - Continue SGLT2i - Labs today  9. Orthostatic hypotension - Improved on midodrine.   Glori Bickers, MD  12:59 PM -

## 2022-10-21 ENCOUNTER — Telehealth
Admit: 2022-10-21 | Payer: PRIVATE HEALTH INSURANCE | Attending: Pharmacist Clinician (PhC)/ Clinical Pharmacy Specialist | Primary: Internal Medicine

## 2022-10-21 NOTE — Telephone Encounter
PHARMACY NOTEPatient previously received clinic-administered Amvuttra through Dr. Hyacinth Meeker. Per visit note 08/23/22, patient has moved back to [place] and will be following up with his team there. Placed call to office of Dr. Jamelle Rushing, MD [phone]) and left message with information that patient previously needed prior authorization for his Amvuttra administrations and that this will expire 11/03/22. Left clinic phone number if they have any questions. Jacinta Shoe, PharmD Ambulatory Clinical Pharmacist, [facility]

## 2022-10-26 ENCOUNTER — Other Ambulatory Visit (HOSPITAL_COMMUNITY): Payer: Self-pay

## 2022-10-27 ENCOUNTER — Other Ambulatory Visit (HOSPITAL_COMMUNITY): Payer: Self-pay

## 2022-10-28 ENCOUNTER — Other Ambulatory Visit (HOSPITAL_COMMUNITY): Payer: Self-pay

## 2022-11-01 ENCOUNTER — Other Ambulatory Visit (HOSPITAL_COMMUNITY): Payer: Self-pay

## 2022-11-01 ENCOUNTER — Telehealth (HOSPITAL_COMMUNITY): Payer: Self-pay | Admitting: Pharmacist

## 2022-11-01 NOTE — Telephone Encounter (Signed)
Advanced Heart Failure Patient Advocate Encounter  Medical Prior Authorization for Frank Berg has been approved.    PA# [id number] Effective dates: 11/01/22 through 11/02/23  Audry Riles, PharmD, BCPS, BCCP, CPP Heart Failure Clinic Pharmacist [phone]

## 2022-11-09 ENCOUNTER — Other Ambulatory Visit: Payer: Self-pay | Admitting: Interventional Cardiology

## 2022-11-09 DIAGNOSIS — I5032 Chronic diastolic (congestive) heart failure: Secondary | ICD-10-CM

## 2022-11-09 NOTE — Telephone Encounter (Signed)
This is a CHF pt 

## 2022-11-11 ENCOUNTER — Other Ambulatory Visit (HOSPITAL_COMMUNITY): Payer: Self-pay

## 2022-11-22 ENCOUNTER — Ambulatory Visit: Payer: Medicare HMO | Admitting: Internal Medicine

## 2022-11-23 ENCOUNTER — Ambulatory Visit (HOSPITAL_COMMUNITY)
Admission: RE | Admit: 2022-11-23 | Discharge: 2022-11-23 | Disposition: A | Discharge status: Home / Self Care | Payer: Medicare HMO | Source: Ambulatory Visit | Attending: Cardiology | Admitting: Cardiology

## 2022-11-23 DIAGNOSIS — Z79899 Other long term (current) drug therapy: Secondary | ICD-10-CM | POA: Diagnosis not present

## 2022-11-23 DIAGNOSIS — I5022 Chronic systolic (congestive) heart failure: Secondary | ICD-10-CM | POA: Diagnosis present

## 2022-11-23 DIAGNOSIS — I501 Left ventricular failure: Secondary | ICD-10-CM | POA: Insufficient documentation

## 2022-11-23 DIAGNOSIS — G63 Polyneuropathy in diseases classified elsewhere: Secondary | ICD-10-CM | POA: Diagnosis not present

## 2022-11-23 DIAGNOSIS — Z951 Presence of aortocoronary bypass graft: Secondary | ICD-10-CM | POA: Diagnosis not present

## 2022-11-23 DIAGNOSIS — Z7984 Long term (current) use of oral hypoglycemic drugs: Secondary | ICD-10-CM | POA: Diagnosis not present

## 2022-11-23 DIAGNOSIS — Z952 Presence of prosthetic heart valve: Secondary | ICD-10-CM | POA: Diagnosis not present

## 2022-11-23 DIAGNOSIS — I43 Cardiomyopathy in diseases classified elsewhere: Secondary | ICD-10-CM | POA: Diagnosis not present

## 2022-11-23 DIAGNOSIS — E119 Type 2 diabetes mellitus without complications: Secondary | ICD-10-CM | POA: Insufficient documentation

## 2022-11-23 DIAGNOSIS — I2510 Atherosclerotic heart disease of native coronary artery without angina pectoris: Secondary | ICD-10-CM | POA: Insufficient documentation

## 2022-11-23 DIAGNOSIS — G4733 Obstructive sleep apnea (adult) (pediatric): Secondary | ICD-10-CM | POA: Diagnosis not present

## 2022-11-23 DIAGNOSIS — E851 Neuropathic heredofamilial amyloidosis: Secondary | ICD-10-CM | POA: Diagnosis not present

## 2022-11-23 DIAGNOSIS — E854 Organ-limited amyloidosis: Secondary | ICD-10-CM | POA: Insufficient documentation

## 2022-11-23 MED ORDER — VUTRISIRAN SODIUM 25 MG/0.5ML ~~LOC~~ SOSY
25.0000 mg | PREFILLED_SYRINGE | Freq: Once | SUBCUTANEOUS | Status: AC
  Administered 2022-11-23: 25 mg via SUBCUTANEOUS
  Filled 2022-11-23: qty 0.5

## 2022-11-23 NOTE — Patient Instructions (Signed)
It was a pleasure seeing you today!  MEDICATIONS: -No medication changes today -Call if you have questions about your medications.  NEXT APPOINTMENT: Return to clinic in 3 months for repeat Amvuttra injection.  In general, to take care of your heart failure: -Limit your fluid intake to 2 Liters (half-gallon) per day.   -Limit your salt intake to ideally 2-3 grams (2000-3000 mg) per day. -Weigh yourself daily and record, and bring that "weight diary" to your next appointment.  (Weight gain of 2-3 pounds in 1 day typically means fluid weight.) -The medications for your heart are to help your heart and help you live longer.   -Please contact us before stopping any of your heart medications.  Call the clinic at [phone] with questions or to reschedule future appointments.

## 2022-11-23 NOTE — Progress Notes (Signed)
Advanced Heart Failure Clinic Note   Primary Care: [name], MD Primary Cardiologist: Larae Grooms, MD HF Cardiologist: Dr. Haroldine Laws [facility]: Dr Radene Knee  HPI:  Frank Berg is a 67 y.o. male referred by Dr. Irish Lack for further evaluation of his HF.    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 Also has h/o AF s/p AF ablation at [facility] in 2017. CAD s/p previous stent (2008) followed by LIMA to LAD at time of AVR. TTR Amyloid/    Admitted in 4/22 with near syncope. EF 45% by echo. RV mildly HK. RVSP 14mHG    R/L cath 5/22: mLAD 90% (patent LIMA to LAD), LCX and RCA minimal CAD.  RA 9 RV 24/0 PA 25/8 (15) PCWP 5 Fick 3.4/1.6   We saw him for the first time on 08/23/21 for AHF consult. W/u revealed severe TTR cardiac amyloidosis. Started on tafamadis. CPX with marked HF limitation.  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 Diuresed 14 pounds. D/c weight 171 lbs. Started on torsemide 40 mg but hadn't taken it yet.   Echo 12/17/21: EF 45-50% severe LVH RV mod HK.   Follow up 12/2021, volume increasing, torsemide 40 mg daily started.    [facility]ed on Amvuttra in 01/2022 by Neurology   [place] [facility] showed pVO2 9.8 but apparently didn't reach AT so felt to be submax test. No RER or VE/VCO2 slope reported. [facility] [place]   Today he returns to HF clinic for Amvuttra administration. Patient doing well today. No contraindications to injection.  .  Assessment/Plan: 1. Chronic systolic HF with R>>L heart failure - echo 01/2021 EF 45% +LVH RV mildly HK. RVSP 371mG with question of mild D-shaped septum - R/L cath 02/2021: mLAD 90% (patent LIMA to LAD), LCX and RCA minimal CAD.  RA 9 RV 24/0 PA 25/8 (15) PCWP 5 Fick 3.4/1.6CPX 09/07/21 - CPX 08/2021: pVO2 10.4 VE/VCO2 slope:  48, pRER: 1.09   - cMRI 09/22/21: EF 40% RVEF 46%  diffuses LGE and markedly elevated ECW ~71%. degenerative appearing Bioprosthetic AVR with mild AI. Reviewed with imaging team and felt to be c/w advanced cardiac amyloidosis. - PYP 10/2020 markedly positive. 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 Diuresed 14 pounds - CPX confirms severe HF limitation - [facility]HA [facility]. Volume status stable. Continue torsemide 20 mg daily.  - Off bisoprolol. - Continue Jardiance 10 mg daily. - Continue spironolactone 12.5 mg daily.  - Continue midodrine 5 mg TID + compression hose for orthostasis.  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 Patient tolerated injection well. Provided patient counseling on Amvuttra. Most common side effects are injection site reactions, arthralgias and dyspnea. Patient is aware to return to clinic every 3 months for repeat injection.  - Continue vitamin A supplement 8000 IU daily. Amvuttra decreases serum vitamin A levels.   Follow up 3 months for repeat  injection.   Audry Riles, PharmD, BCPS, BCCP, CPP Heart Failure Clinic Pharmacist [phone]

## 2022-11-25 ENCOUNTER — Other Ambulatory Visit (HOSPITAL_COMMUNITY): Payer: Self-pay

## 2022-11-28 ENCOUNTER — Other Ambulatory Visit: Payer: Self-pay

## 2022-11-29 ENCOUNTER — Other Ambulatory Visit: Payer: Self-pay

## 2022-11-29 ENCOUNTER — Encounter (HOSPITAL_COMMUNITY): Payer: Self-pay | Admitting: Pharmacy Technician

## 2022-11-29 ENCOUNTER — Other Ambulatory Visit (HOSPITAL_COMMUNITY): Payer: Self-pay

## 2022-12-01 ENCOUNTER — Encounter: Admit: 2022-12-01 | Payer: PRIVATE HEALTH INSURANCE | Attending: Neuromuscular Medicine | Primary: Internal Medicine

## 2022-12-15 IMAGING — CT CT ANGIO HEAD
1 of 11 series · 5 of 33 positions shown · IV contrast (OMNI 350)
Comparison: None.

CLINICAL DATA: Acute neurologic deficit

EXAM:
CT ANGIOGRAPHY HEAD AND NECK
TECHNIQUE: Multidetector CT imaging of the head and neck was performed using
the standard protocol during bolus administration of intravenous
contrast. Multiplanar CT image reconstructions and MIPs were
obtained to evaluate the vascular anatomy. Carotid stenosis
measurements (when applicable) are obtained utilizing NASCET
criteria, using the distal internal carotid diameter as the
denominator.
CONTRAST:  75mL OMNIPAQUE IOHEXOL 350 MG/ML SOLN

[Series 11: cta neck axial · axial · 0.39mm/px · z∈[-249,-13]mm · 5 of 355 slices shown]
[im 60/355  soft-tissue]
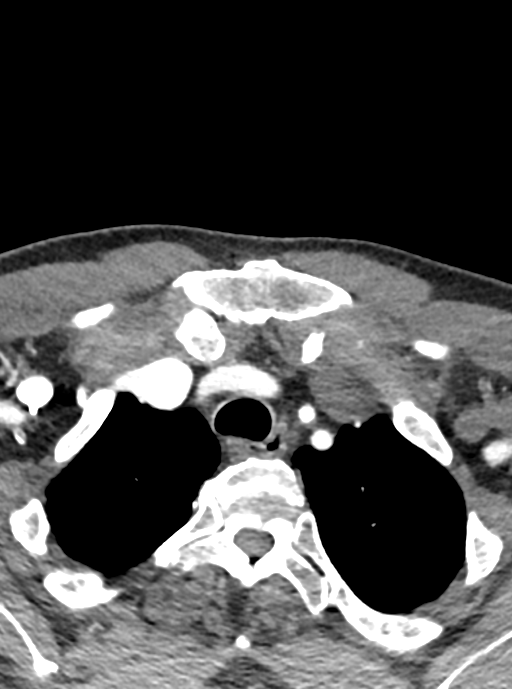
[im 119/355  bone]
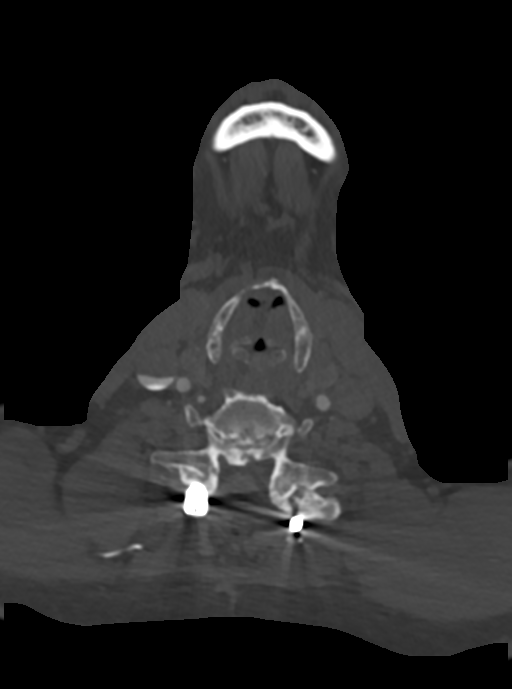
[im 178/355  soft-tissue]
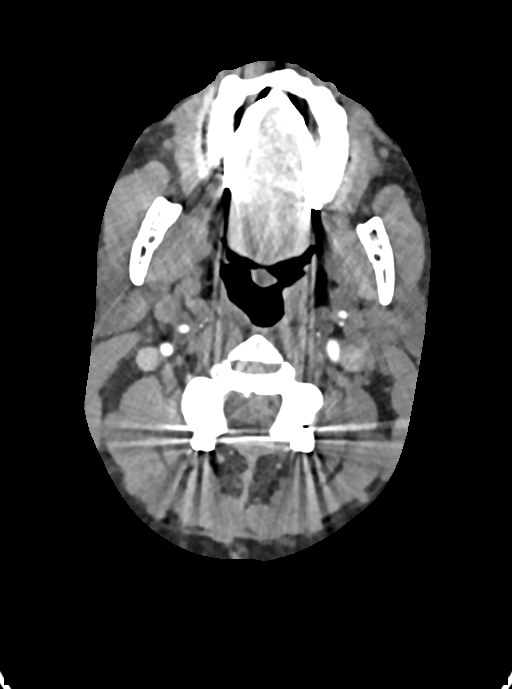
[im 237/355  bone]
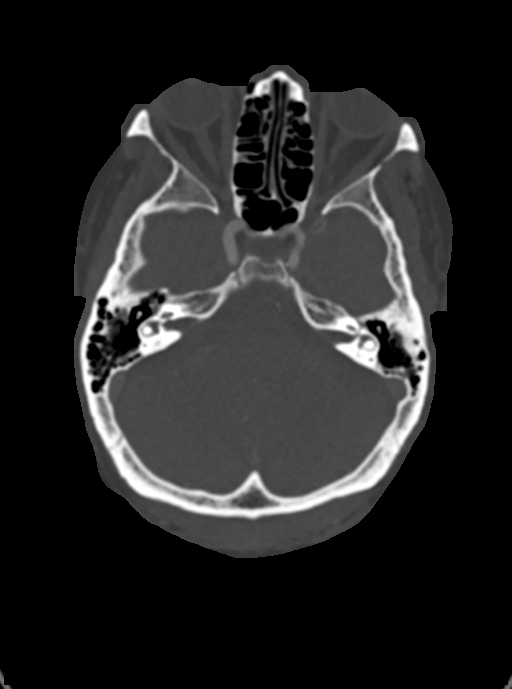
[im 296/355  soft-tissue]
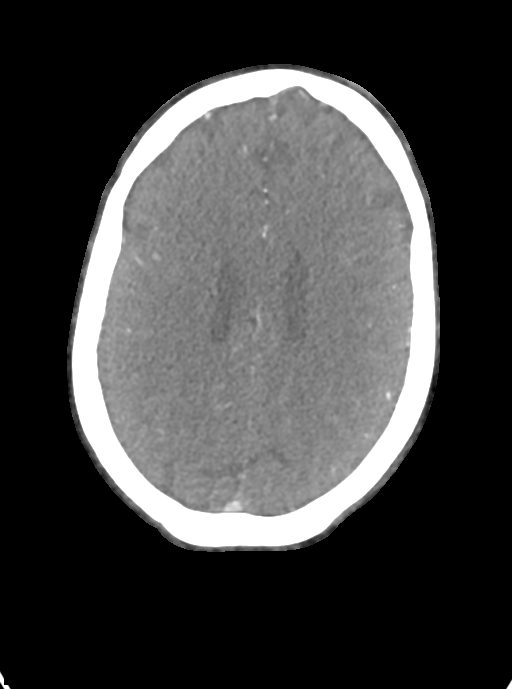

[5 of 33 positions shown; findings below may reference images not displayed]

FINDINGS: CT HEAD FINDINGS

Brain: There is no mass, hemorrhage or extra-axial collection. The
size and configuration of the ventricles and extra-axial CSF spaces
are normal. There is no acute or chronic infarction. The brain
parenchyma is normal.

Skull: The visualized skull base, calvarium and extracranial soft
tissues are normal.

Sinuses/Orbits: No fluid levels or advanced mucosal thickening of
the visualized paranasal sinuses. No mastoid or middle ear effusion.
The orbits are normal.

CTA NECK FINDINGS

SKELETON: C3-7 posterior decompression

OTHER NECK: Normal pharynx, larynx and major salivary glands. No
cervical lymphadenopathy. Unremarkable thyroid gland.

UPPER CHEST: No pneumothorax or pleural effusion. No nodules or
masses.

AORTIC ARCH:

There is no calcific atherosclerosis of the aortic arch. There is no
aneurysm, dissection or hemodynamically significant stenosis of the
visualized portion of the aorta. Conventional 3 vessel aortic
branching pattern. The visualized proximal subclavian arteries are
widely patent.

RIGHT CAROTID SYSTEM: Normal without aneurysm, dissection or
stenosis.

LEFT CAROTID SYSTEM: Normal without aneurysm, dissection or
stenosis.

VERTEBRAL ARTERIES: The left vertebral artery is absent. I suspect
this is congenital based on the abnormal appearance of the
left-sided transverse foramina.

CTA HEAD FINDINGS

POSTERIOR CIRCULATION:

--Vertebral arteries: Normal right V4 segment

--Inferior cerebellar arteries: Normal.

--Basilar artery: Normal.

--Superior cerebellar arteries: Normal.

--Posterior cerebral arteries (PCA): Normal. Both are predominantly
supplied by the posterior communicating arteries (p-comm).

ANTERIOR CIRCULATION:

--Intracranial internal carotid arteries: Normal.

--Anterior cerebral arteries (ACA): Normal. Both A1 segments are
present. Patent anterior communicating artery (a-comm).

--Middle cerebral arteries (MCA): Normal.

VENOUS SINUSES: As permitted by contrast timing, patent.

ANATOMIC VARIANTS: Fetal origins of both posterior cerebral
arteries.

Review of the MIP images confirms the above findings.
IMPRESSION: 1. No intracranial arterial occlusion or high-grade stenosis.
2. Absence of the left vertebral artery, favored to be congenital
based on the abnormal appearance of the left-sided transverse
foramina.

Aortic Atherosclerosis ([id number]).

## 2022-12-16 IMAGING — DX DG CHEST 1V PORT
1 series · 2 of 2 positions shown · non-contrast
Comparison: CT chest dated 12/14/2020

CLINICAL DATA: Hemoptysis

EXAM:
PORTABLE CHEST 1 VIEW

[Series 1: chest · 0.14mm/px · 2 of 2 slices shown]
[im 1/2]
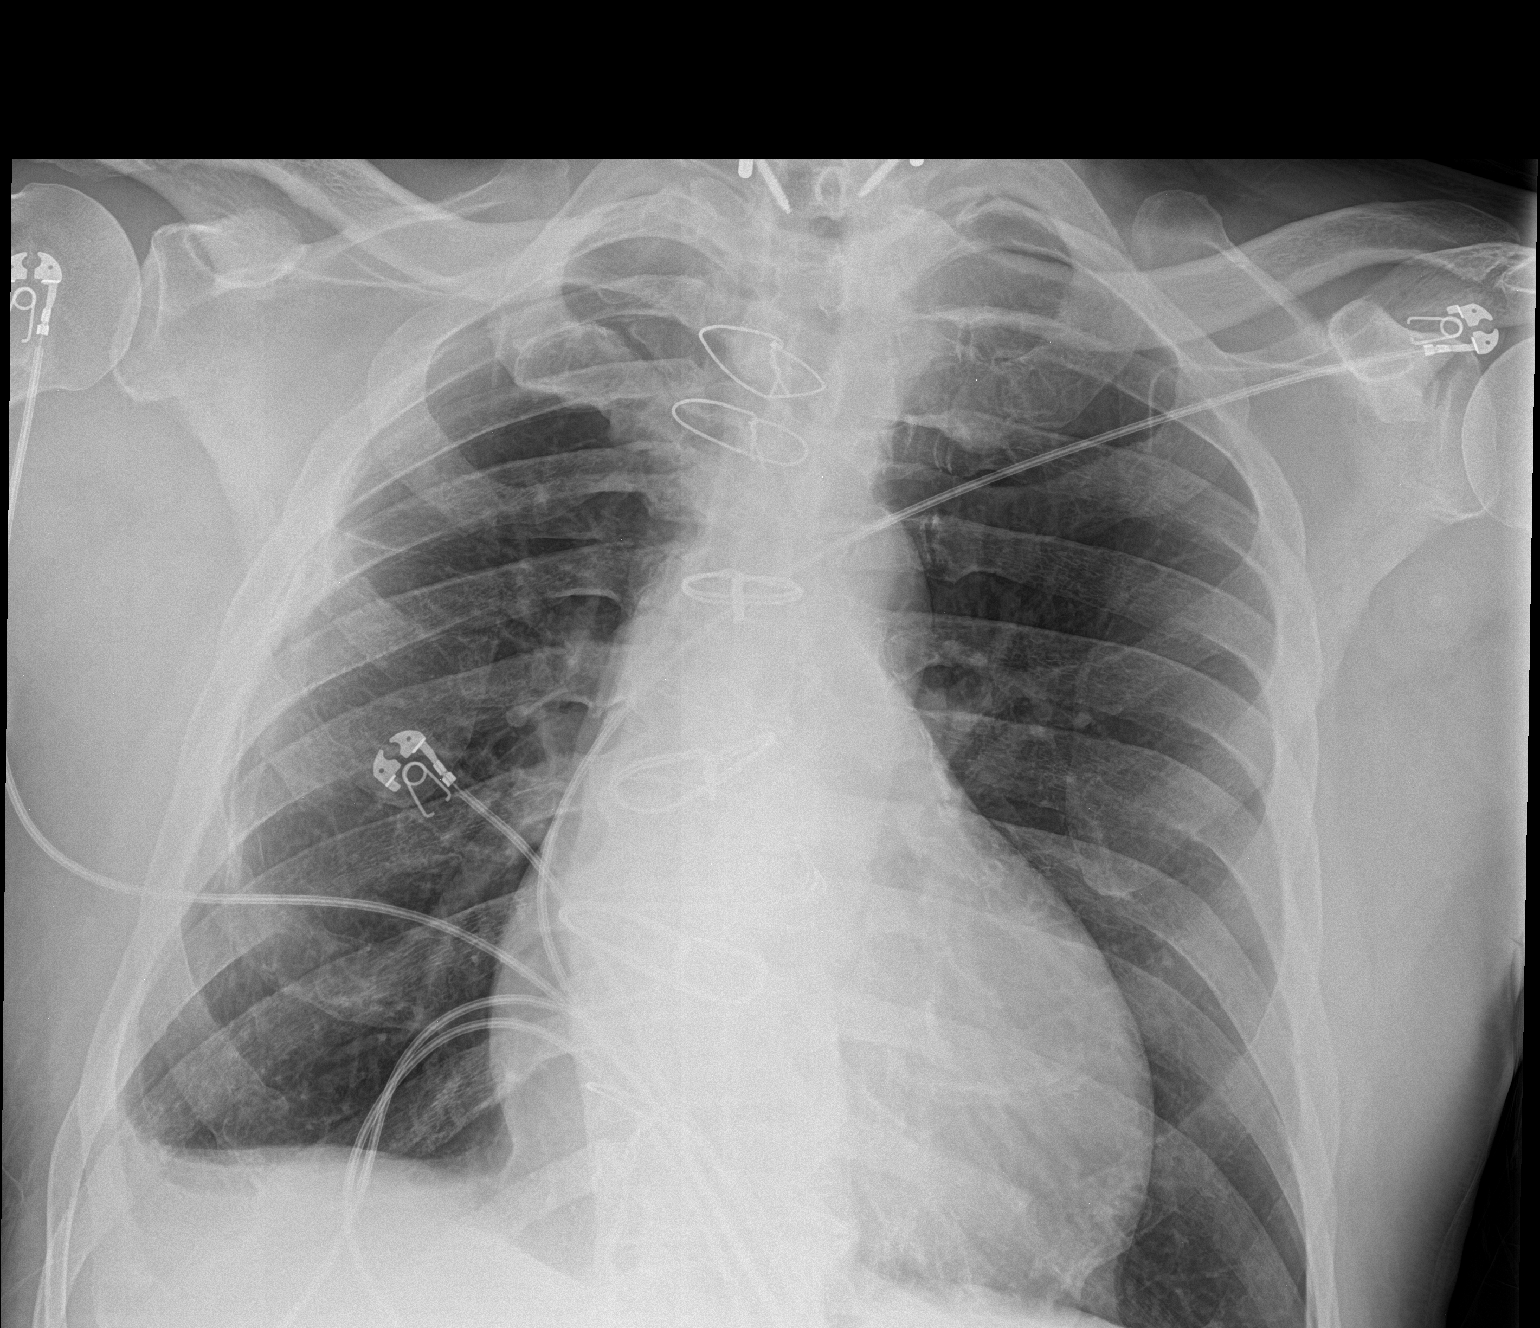
[im 2/2]
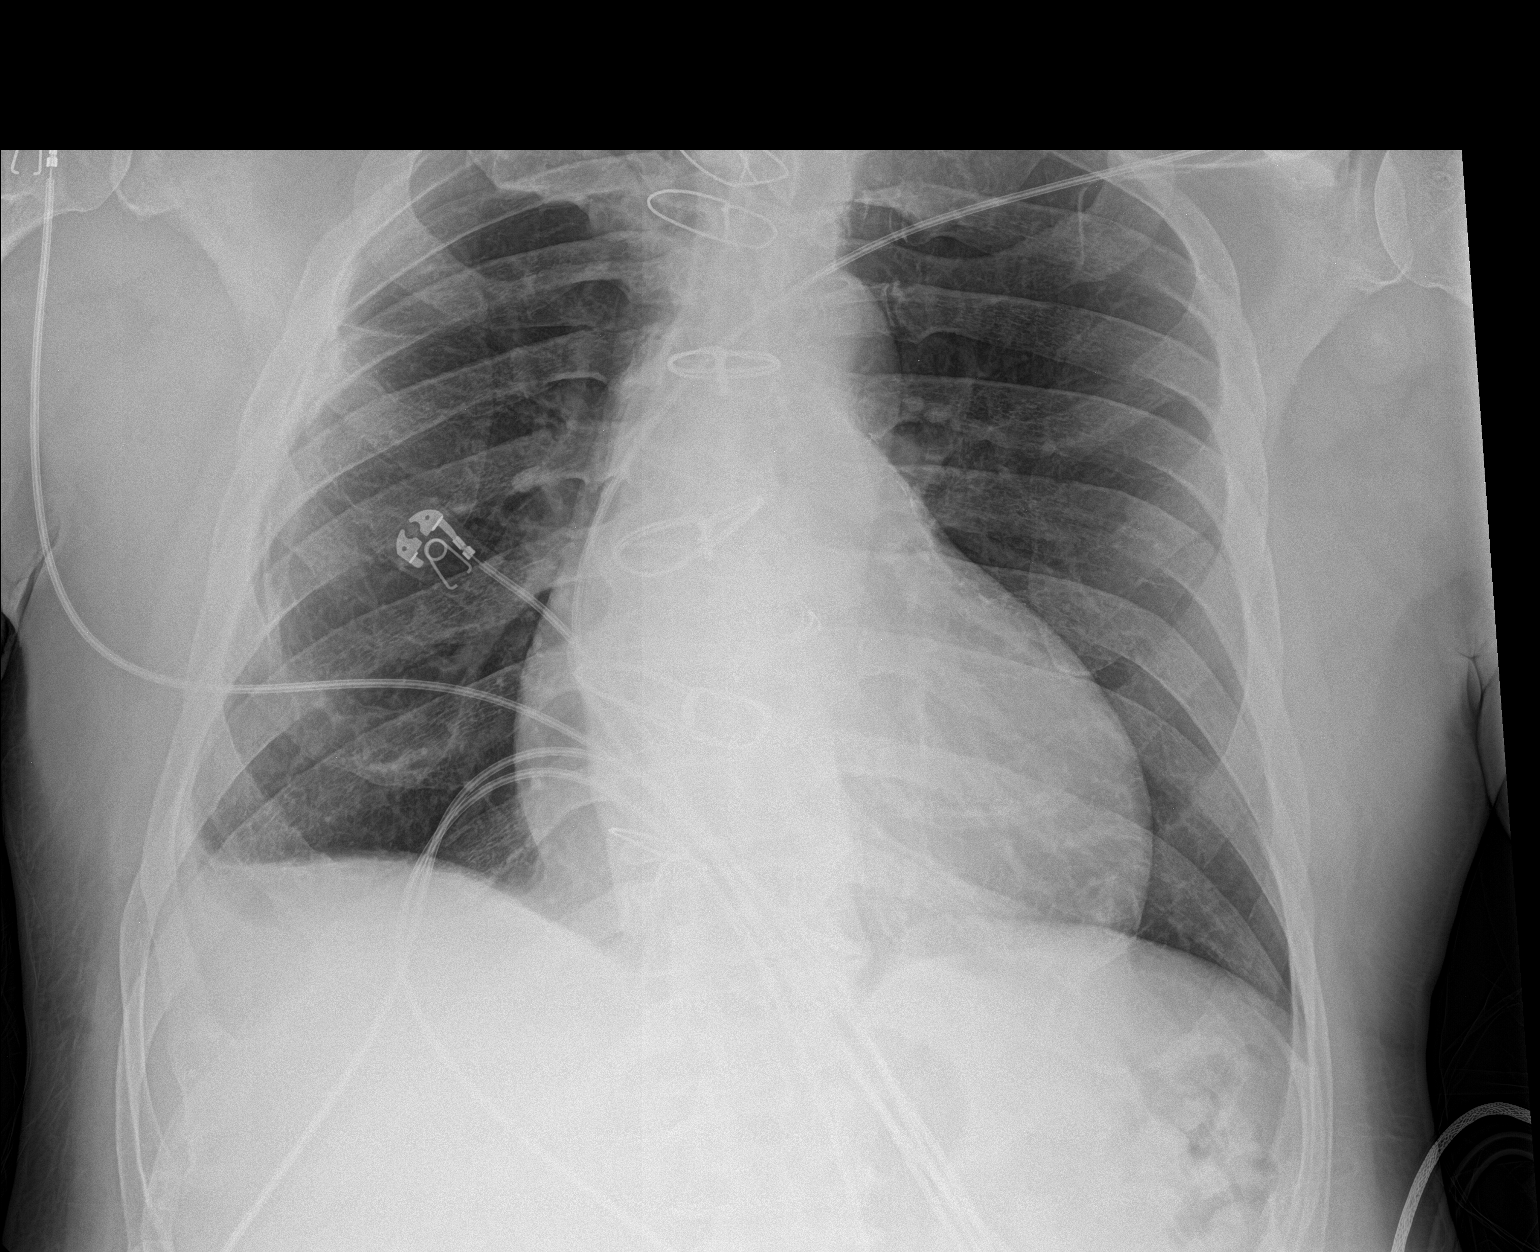

[2 of 2 positions shown; findings below may reference images not displayed]

FINDINGS: Pleural thickening at the lateral right lung base, chronic. Left
lung is clear. No pleural effusion or pneumothorax.

Heart is normal in size.  Prosthetic aortic valve.

Median sternotomy.

Cervical spine fixation hardware, incompletely visualized.
IMPRESSION: No evidence of acute cardiopulmonary disease. Chronic right pleural
thickening.

## 2022-12-21 ENCOUNTER — Other Ambulatory Visit (HOSPITAL_COMMUNITY): Payer: Self-pay

## 2023-01-02 ENCOUNTER — Other Ambulatory Visit: Payer: Self-pay

## 2023-01-02 ENCOUNTER — Encounter: Payer: Self-pay | Admitting: Speech Pathology

## 2023-01-02 ENCOUNTER — Ambulatory Visit: Payer: Medicare HMO | Attending: Physician Assistant | Admitting: Speech Pathology

## 2023-01-02 DIAGNOSIS — R498 Other voice and resonance disorders: Secondary | ICD-10-CM | POA: Insufficient documentation

## 2023-01-02 NOTE — Patient Instructions (Signed)
Expiratory Muscle Strength Training (EMST) - look it up   PHoRTE - twice a day  [email or web address]   10 Loud AH's as loud as you can and as long as you can       To help coach you at home with your loud AH!!   2. 10 Pitch glides up, 10 pitch glides down   3. 10 sentences in loud high pitch voice, like you are calling your neighbor over the phone  4. 10 sentences in loud low authoritative pitch, like you are the boss  Use a good belly breath before each exercise- feel your abs contract in as you use your voice  Use a good abdominal breath to power your voice when talking  Keep reducing throat clears    [place]  1. Avoid overuse of voice or excessive use of the voice The vocal cords can become easily fatigued. Try to sort out what is "necessary" versus "unnecessary" talking in your environment. You do not need to STOP talking, but try to limit it as much as possible.  Think of resting your voice just as long as you talk. For example, if you talk for 5 minutes, rest your voice completely for 5 minutes. If your voice feels "tired" during the day, try to rest it as much as possible.  Avoid using an excessively loud voice or shouting/raising your voice When you yell or raise your voice, the vocal cords slam into each other, much like a strong hand clap. This causes irritation, and if this irritation continues, hoarseness may increase. If people in your home talk loudly, ask them to reduce the volume of their voices to help you decrease your volume as well. Sit near or face the person to whom you are speaking.   3. Avoid talking over background noise When talking in background noise, speech automatically has increased loudness, and as in number 2 above, continued loud speech can result in increased hoarseness due to irritation to the vocal cords.  Do not talk over the radio or the TV. Mute them before speaking, go to a quieter place to  talk, or sit next to the person with whom you are watching.  4. Talk in a voice that is soft, smooth, and gentle This allows the vocal cords to come together in a gentle way and allows the air being exhaled from the lungs to do most/all of the work when you are speaking.  5. Avoid excessive throat clearing, coughing, and loud laughter During these activities the vocal cords can also be slammed together in a hard way and foster irritation or swelling, which can cause hoarseness. Try taking sips of room temperature/cool water with hard swallows to clear secretions from the throat, instead of throat clearing or coughing. If you absolutely must clear your throat, do so as gently as possible. If you find yourself clearing your throat or coughing a lot, consult your physician. You will want to laugh. Continue to do so, but softly and gently. Do not laugh loudly for long periods of time because this can increase the chances for vocal fold irritation and thus hoarseness. Lozenges can help reduce the need to clear throat/cough during cold/allergy season.  6. Keep the mouth and throat lubricated Drink at least 8-10 8 oz. glasses of water per day (64-80 oz.). This water can come in the form of drink mixes.  Caffeinated beverages such as colas, coffee, and tea (hot tea AND sweet tea) dry out the vocal cords and  then can cause irritation and thus hoarseness.  Drinking water will keep your body hydrated. This will help to decrease secretions in the throat, which cause people to clear their throats or cough. If you are exercising outside (especially during drier weather), try to breathe through your nose as much as possible. If this is not possible, lifting the tongue up behind the upper teeth when breathing through the mouth will add some moisture to the air breathed in past the vocal cords.  7. Avoid mouth breathing - breathe through your nose Your nose serves as a natural filter for dust and dirt particles  from the air, and as a humidifier to moisten the air. Vocal cords like to work in moistened, filtered air. When you breathe through your mouth you lose the air filtering and moistening benefits of breathing through the nose. Be aware of breathing patterns when sitting quietly (e.g., reading or watching TV). Increase your awareness and try to change habits from mouth breathing to nose breathing during those times.  8. Avoid environmental and/or ingested irritants Try to avoid smoke-filled and or dusty environments. These items dry out the vocal cords and cause irritation.  9. Use an air filter if the home is dusty, and/or a humidifier if the air is dry  This will help to maintain clean, humid air to breathe. Remember, this type of air is what the vocal cords like best.

## 2023-01-02 NOTE — Therapy (Signed)
OUTPATIENT SPEECH LANGUAGE PATHOLOGY VOICE EVALUATION   Patient Name: Frank Berg MRN: [id number] DOB:03/10/1956, 67 y.o., male Today's Date: 01/02/2023  PCP: [name] MD REFERRING PROVIDER: Shon Hale, PA  END OF SESSION:  End of Session - 01/02/23 1521     Visit Number 1    Number of Visits 17    Date for SLP Re-Evaluation 02/27/23    SLP Start Time 1015    SLP Stop Time  1100    SLP Time Calculation (min) 45 min    Activity Tolerance Patient tolerated treatment well             Past Medical History:  Diagnosis Date   Atrial fibrillation ([place])    Coronary artery disease    Diabetes mellitus without complication ([place])    Diabetic polyneuropathy ([place])    GERD (gastroesophageal reflux disease)    Gout, unspecified    HLD (hyperlipidemia)    Hx of adenomatous polyp of colon 12/03/2020   Hypertension    OSA on CPAP    Sleep apnea    Past Surgical History:  Procedure Laterality Date   ABLATION     AORTIC VALVE REPLACEMENT  2011   CERVICAL [place]     spinal stenosis   COLONOSCOPY     ESOPHAGOGASTRODUODENOSCOPY     RIGHT/LEFT HEART CATH AND CORONARY/GRAFT ANGIOGRAPHY N/A 03/18/2021   Procedure: RIGHT/LEFT HEART CATH AND CORONARY/GRAFT ANGIOGRAPHY;  Surgeon: Jettie Booze, MD;  Location: [facility];  Service: Cardiovascular;  Laterality: N/A;   TONSILLECTOMY     Patient Active Problem List   Diagnosis Date Noted   Acute respiratory failure with hypoxia (HCC)    Precordial chest pain    SOB (shortness of breath)    Acute CHF (congestive heart failure) ([place]) 12/17/2021   Acute CHF ([place]) 12/17/2021   Abnormal findings on diagnostic imaging of liver and biliary tract 05/25/2021   Benign prostatic hyperplasia with lower urinary tract symptoms [id number]   Diastolic dysfunction [id number]   Ischemic cardiomyopathy 05/25/2021   Long term (current) use of anticoagulants 05/25/2021   Unintentional weight loss 05/25/2021    Decompensated heart failure ([place]) [id number]   Systolic CHF, chronic (HCC)    Hemoptysis 03/11/2021   Near syncope 02/12/2021   CKD (chronic kidney disease), 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   Chronic diastolic CHF (congestive heart failure) ([place]) 02/12/2021   Anticoagulated by anticoagulation treatment 01/05/2021   Bilateral impacted cerumen 01/05/2021   Hx of adenomatous polyp of colon 12/03/2020   Coronary artery disease due to lipid rich plaque 12/27/2018   Atrial fibrillation ([place]) 12/27/2018   Obesity 09/10/2018   Right-sided Bell's palsy 07/30/2018   Raynaud's disease 11/06/2017   Male erectile disorder 09/13/2017   CAD (coronary artery disease) 06/20/2017   Hyperlipidemia 06/20/2017   PAF (paroxysmal atrial fibrillation) ([place]) 06/20/2017   Gout 05/22/2017   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[place]) [id number]   Acute systolic CHF (congestive heart failure) ([place]) 10/10/2016   Elevated troponin 10/09/2016   Benign essential hypertension 09/05/2016   Healthcare maintenance 09/05/2016   Need for immunization against influenza 09/05/2016   Pure hypercholesterolemia 09/05/2016   Typical atrial flutter ([place]) 07/19/2016   S/P cryoablation of arrhythmia 06/02/2016   OSA on CPAP 06/02/2016   Type 2 diabetes mellitus ([place]) 06/02/2016   Bilateral edema of lower extremity 05/02/2016   Presence of prosthetic heart valve 05/02/2016   DOE (dyspnea on exertion) 05/02/2016    ONSET DATE: 12/28/22 - referral  date  REFERRING DIAG: R49.0 (ICD-10-CM) - Hoarseness R49.0 (ICD-10-CM) - Dysphonia  THERAPY DIAG:  Other voice and resonance disorders - Plan: SLP plan of care cert/re-cert  Rationale for Evaluation and Treatment: Rehabilitation  SUBJECTIVE:   SUBJECTIVE STATEMENT: "I haven't been socializing much" Pt accompanied by: self  PERTINENT HISTORY: Frank Berg saw ENT 11/10/22 and was placed on reflux meds with no improvement in voice. 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  Frank Berg is weaning off of reflux meds at this time  PAIN:  Are you having pain? No  FALLS: Has patient fallen in last 6 months?  No  LIVING ENVIRONMENT: Lives with: lives with their spouse Lives in: House/apartment  PLOF:  Level of assistance: Independent with ADLs, Independent with IADLs Employment: Retired  PATIENT GOALS: "To improve my voice to sing again"  OBJECTIVE:   DIAGNOSTIC FINDINGS: Most recent ENT consult Frank Berg returns today for follow-up of hoarseness. He reports that there has not been dramatic improvement since his last visit. He has been taking the Prevacid regularly and trying to watch his diet. He still notes that his voice tends to weaken as the day progresses. He states that some days are just better than others.Flexible laryngoscopy shows patent anterior nasal cavity with minimal crusting, no discharge or infection. Scar band on right side of nasopharynx Normal base of tongue and supraglottis. Omega shaped epiglottis Normal vocal cord mobility without vocal cord nodule, mass, polyp or tumor. Some slight bowing and thinning of true vocal folds. Left arytenoid shows mucous retention cyst as before hypopharynx normal without mass, pooling of secretions or aspiration. Dr. Redmond Baseman was consulted and observed this endoscopy Patient tolerated procedure without complication or difficulty.  Camelia Eng. Spainhour, PA-C  Impression & Plans:   1) hoarseness 2) presbylaryngus2/29/24:   COGNITION: Overall cognitive status: Within functional limits for tasks assessed Areas of impairment: none Comments:   MOTOR SPEECH: Overall motor speech: impaired Level of impairment: Word Respiration: thoracic breathing Phonation: hoarse and low vocal intensity Resonance: WFL Articulation: Appears intact Intelligibility: Intelligibility reduced Motor planning: Appears intact Motor speech errors: aware Interfering components:  Effective technique: increased vocal  intensity  ORAL MOTOR EXAMINATION: Overall status: WFL Comments:    OBJECTIVE VOICE ASSESSMENT: Sustained "ah" maximum phonation time: 14.51 seconds Sustained "ah" loudness average: 70 dB Oral reading (passage) loudness average: 55.7 dB Oral reading loudness range: 55 to 57 dB Conversational loudness average: 55 dB Voice quality: hoarse, rough, and low vocal intensity Average Pitch: 98 Hz Pitch Range: 65.4 to 523.3 Hz S:Z ratio - 1.01 Stimulability trials: Given SLP modeling and usual mod cues, loudness average increased to 73dB (range of 70 to 78dB) at (loud "sentence, level) with improved hoarseness and clear phonation  Reflux Symptom Index (RSI) 30 (10-13 is WNL)  Completed audio recording of patients baseline voice without cueing from SLP: No  Pt does not report difficulty with swallowing which does not warrant further evaluation.  PATIENT REPORTED OUTCOME MEASURES (PROM): VHI: 77 - severe. Ory  rated a 4 or "always" for people ask me to repeat myself, the sound of voice varies throughout the day, voice is worse in the evening. He rated a 3, or "almost always" having difficulty being heard, being understood in noisy room, being heard in another room, feeling strain to produce voice, using much effort to speak, inconsistent clarity  TODAY'S TREATMENT:  DATE: 01/02/23 (eval day): As Frank Berg was stimulable for clear voice and normal volume with cues for breath support and volume and his dx with bowing and thinning of vocal folds, I initiated PhoRTE with usual min to mod A for volume, pitch glides and high/low pitch sentences. [place] achieved clear phonation and WNL volume, Ongoing verbal cues to ID change in voice quality when he speaks with intent. He denies feeling strain or vocal fatigue after HEP training  PATIENT EDUCATION: Education details:  see today's treatment and patient instructions, HEP for voice Person educated: Patient Education method: Explanation, Demonstration, Verbal cues, and Handouts Education comprehension: returned demonstration, verbal cues required, and needs further education  HOME EXERCISE PROGRAM: PhoRTE   GOALS: Goals reviewed with patient? Yes  SHORT TERM GOALS: Target date: 01/30/23  Pt will complete HEP for voice with rare min A over 2 sessions Baseline: Goal status: INITIAL  2.  Pt will achieve 80dB on loud Ah and 72dB on 18/20 sentences with rare min A Baseline:  Goal status: INITIAL  3.  Pt will average 70dB with clear phonation over 5 minutes of simple conversation with rare min A Baseline:  Goal status: INITIAL  4.  Pt will verbalize reflux precautions with rare min A Baseline:  Goal status: INITIAL  5.  Pt will be audible in mildly noise environment with clear phonation over 5 minute conversation with occasional min A Baseline:  Goal status: INITIAL  6. Pt will eliminate throat clears and demonstrate throat clear alternatives with rare min A           Goal Status: INITIAL   LONG TERM GOALS: Target date: 02/27/23  Pt will complete HEP for voice with mod I Baseline:  Goal status: INITIAL  2.  Pt will average 70dB with clear phonation over 15 minute conversation with rare min A Baseline:  Goal status: INITIAL  3.  Pt will be intelligible in noisy environment over 10 minute conversation or while walking out side with rare min A Baseline:  Goal status: INITIAL  4.  Pt will report 50% less requests for repetition (subjectively) over 1 week Baseline:  Goal status: INITIAL  5.  Pt will improve score on Voice Handicap Index by 8 points Baseline:  Goal status: INITIAL ASSESSMENT:  CLINICAL IMPRESSION: Patient is a 67 y.o. male who was seen today for moderate voice disorder. He reports his spouse and family frequently don't understand him. Volume quite low, averaging 55dB (70  is WNL) and hoarse. Rexie  reports some depression and 70lb weight loss due to dx of hereditary amyloidosis and divorce. He used to play pickle ball and exercise regularly. At this time, he is not socializing much and is not talking to many people. He mainly stays home.When volume targeted, Ld demonstrated improved phonation and intelligibility. He denied any laryngeal tension or strain with exuberant voice trials. He endorses frequent throat clearing, however I observed this only 2x today. [place] is a good candidate for voice therapy. I recommend skilled ST to maximize intelligibility for QOL, safety and life participation.   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 These impairments are limiting patient from effectively communicating at home and in community.Factors affecting potential to achieve goals and functional outcome are  N/A .Marland Kitchen Patient will benefit from skilled SLP services to address above impairments and improve overall function.  REHAB POTENTIAL: Good  PLAN:  SLP FREQUENCY: 2x/week  SLP DURATION: 8 weeks  PLANNED INTERVENTIONS: Environmental controls, Cueing hierachy, Internal/external aids,  Multimodal communication approach, SLP instruction and feedback, and Compensatory strategies, possible EMST    Krishna Heuer, Annye Rusk, [place] 01/02/2023, 3:35 PM

## 2023-01-03 ENCOUNTER — Other Ambulatory Visit (INDEPENDENT_AMBULATORY_CARE_PROVIDER_SITE_OTHER): Payer: Medicare HMO

## 2023-01-03 ENCOUNTER — Ambulatory Visit: Payer: Medicare HMO | Admitting: Internal Medicine

## 2023-01-03 ENCOUNTER — Encounter: Payer: Self-pay | Admitting: Internal Medicine

## 2023-01-03 ENCOUNTER — Ambulatory Visit (INDEPENDENT_AMBULATORY_CARE_PROVIDER_SITE_OTHER)
Admission: RE | Admit: 2023-01-03 | Discharge: 2023-01-03 | Disposition: A | Discharge status: Home / Self Care | Payer: Medicare HMO | Source: Ambulatory Visit | Attending: Internal Medicine | Admitting: Internal Medicine

## 2023-01-03 VITALS — BP 120/66 | HR 72 | Ht 74.0 in | Wt 161.4 lb

## 2023-01-03 DIAGNOSIS — R634 Abnormal weight loss: Secondary | ICD-10-CM

## 2023-01-03 DIAGNOSIS — R932 Abnormal findings on diagnostic imaging of liver and biliary tract: Secondary | ICD-10-CM

## 2023-01-03 DIAGNOSIS — R1310 Dysphagia, unspecified: Secondary | ICD-10-CM | POA: Diagnosis not present

## 2023-01-03 DIAGNOSIS — R6881 Early satiety: Secondary | ICD-10-CM

## 2023-01-03 DIAGNOSIS — E852 Heredofamilial amyloidosis, unspecified: Secondary | ICD-10-CM | POA: Diagnosis not present

## 2023-01-03 DIAGNOSIS — K5909 Other constipation: Secondary | ICD-10-CM

## 2023-01-03 DIAGNOSIS — E859 Amyloidosis, unspecified: Secondary | ICD-10-CM

## 2023-01-03 HISTORY — DX: Heredofamilial amyloidosis, unspecified: E85.2

## 2023-01-03 LAB — CBC WITH DIFFERENTIAL/PLATELET
Basophils Absolute: 0 10*3/uL (ref 0.0–0.1)
Basophils Relative: 1.3 % (ref 0.0–3.0)
Eosinophils Absolute: 0 10*3/uL (ref 0.0–0.7)
Eosinophils Relative: 0.6 % (ref 0.0–5.0)
HCT: 41.3 % (ref 39.0–52.0)
Hemoglobin: 13.3 g/dL (ref 13.0–17.0)
Lymphocytes Relative: 35.4 % (ref 12.0–46.0)
Lymphs Abs: 1.3 10*3/uL (ref 0.7–4.0)
MCHC: 32.2 g/dL (ref 30.0–36.0)
MCV: 84 fl (ref 78.0–100.0)
Monocytes Absolute: 0.3 10*3/uL (ref 0.1–1.0)
Monocytes Relative: 9.2 % (ref 3.0–12.0)
Neutro Abs: 2 10*3/uL (ref 1.4–7.7)
Neutrophils Relative %: 53.5 % (ref 43.0–77.0)
Platelets: 269 10*3/uL (ref 150.0–400.0)
RBC: 4.92 Mil/uL (ref 4.22–5.81)
RDW: 19.1 % — ABNORMAL HIGH (ref 11.5–15.5)
WBC: 3.7 10*3/uL — ABNORMAL LOW (ref 4.0–10.5)

## 2023-01-03 LAB — COMPREHENSIVE METABOLIC PANEL
ALT: 8 U/L (ref 0–53)
AST: 47 U/L — ABNORMAL HIGH (ref 0–37)
Albumin: 4.4 g/dL (ref 3.5–5.2)
Alkaline Phosphatase: 140 U/L — ABNORMAL HIGH (ref 39–117)
BUN: 51 mg/dL — ABNORMAL HIGH (ref 6–23)
CO2: 25 mEq/L (ref 19–32)
Calcium: 10.7 mg/dL — ABNORMAL HIGH (ref 8.4–10.5)
Chloride: 92 mEq/L — ABNORMAL LOW (ref 96–112)
Creatinine, Ser: 1.93 mg/dL — ABNORMAL HIGH (ref 0.40–1.50)
GFR: 35.11 mL/min — ABNORMAL LOW (ref >60.00)
Glucose, Bld: 97 mg/dL (ref 70–99)
Potassium: 3.6 mEq/L (ref 3.5–5.1)
Sodium: 130 mEq/L — ABNORMAL LOW (ref 135–145)
Total Bilirubin: 1.6 mg/dL — ABNORMAL HIGH (ref 0.2–1.2)
Total Protein: 9.3 g/dL — ABNORMAL HIGH (ref 6.0–8.3)

## 2023-01-03 LAB — PROTIME-INR
INR: 2.7 ratio — ABNORMAL HIGH (ref 0.8–1.0)
Prothrombin Time: 28.3 s — ABNORMAL HIGH (ref 9.6–13.1)

## 2023-01-03 NOTE — Progress Notes (Signed)
Frank Berg 67 y.o. [birth year] [id number]  Assessment & Plan:   Encounter Diagnoses  Name Primary?   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  I am going to evaluate with the labs and procedures as below.  I think his issues may be neuropathic more so than anything else as I do not believe the type of amyloidosis he has is infiltrative into the intestines.  I have viewed the abdominal films and he definitely has a large amount of stool in the colon and I will recommend a MiraLAX purge and then daily MiraLAX for this patient with Dulcolax every 2 to 3 days as needed.  Lab Results  Component Value Date   WBC 3.7 (L) 01/03/2023   HGB 13.3 01/03/2023   HCT 41.3 01/03/2023   MCV 84.0 01/03/2023   PLT 269.0 01/03/2023     Chemistry      Component Value Date/Time   NA 130 (L) 01/03/2023 1050   NA 139 08/16/2021 1337   K 3.6 01/03/2023 1050   CL 92 (L) 01/03/2023 1050   CO2 25 01/03/2023 1050   BUN 51 (H) 01/03/2023 1050   BUN 31 (H) 08/16/2021 1337   CREATININE 1.93 (H) 01/03/2023 1050      Component Value Date/Time   CALCIUM 10.7 (H) 01/03/2023 1050   ALKPHOS 140 (H) 01/03/2023 1050   AST 47 (H) 01/03/2023 1050   ALT 8 01/03/2023 1050   BILITOT 1.6 (H) 01/03/2023 1050   BILITOT 0.5 04/01/2021 [id number]     Lab Results  Component Value Date   INR 2.7 (H) 01/03/2023   Note INR was ordered because of history of liver issues, this is inaccurate as he is on a Xa inhibitor)    Orders Placed This Encounter  Procedures   US Abdomen Limited RUQ (LIVER/GB)   DG Abd 2 Views   DG ESOPHAGUS W DOUBLE CM (HD)   CBC with Differential/Platelet   Comprehensive metabolic panel   Protime-INR    Further plans  pending the ultrasound and barium swallow, he also has follow-up scheduled for 02/28/2023    Subjective:   Chief Complaint: Constipation, anorexia and weight loss with amyloidosis  HPI Frank Berg is a 67 year old African-American man known to me in 2021 and 2022 when he had heme positive stool and abnormal CT of the liver, who presents for follow-up after having been diagnosed with cardiac amyloidosis and amyloidosis related polyneuropathy.   He was last seen by me on 02/23/2021 with HPI below: Frank Berg is a 67 year old man known to me from prior procedure for heme positive stool, in February of this year he had a diminutive adenoma and hemorrhoids.  He had messaged me about spitting up blood a while back and I recommended he get help though it sounded like he was either coughing or just spitting up bloody saliva so he saw ENT and had a clean exam with an laryngoscopy and he has had some pulmonary appointments but I do not think he has been able to make those yet.  He had a syncopal episode or near syncopal episode and was hospitalized in April.  In February he had a chest CT that did not show a cause  of hemoptysis.  Hematemesis has not been an issue as best I can tell.  He had some heartburn issues a few months ago but not now.  He does have sleep apnea and uses CPAP.  On his chest CT in February it was noted that the contour of the liver was consistent with possible cirrhosis.  His LFTs have been normal as best I can tell looking back through Dr. Duard Brady records available through care everywhere his platelets are normal as well.  His hemoptysis has stopped not in several months.  Note he does take Xarelto.   He reports that he has used low-carb/keto in the past and he is trying to control his belly fat and keep weight off and to control his diabetes.  Over time he has lost weight saying he was 260 pounds at 1 point.      Wt Readings from Last 3 Encounters:  02/23/21 213 lb 6 oz (96.8 kg)  02/13/21  205 lb 0.4 oz (93 kg)  11/25/20 223 lb (101.2 kg)    ULTRASOUND HEPATIC ELASTOGRAPHY 02/26/2021:   Median kPa:  8.2   Diagnostic category: < or = 9 kPa: in the absence of other known clinical signs, rules out cACLD  Wt Readings from Last 3 Encounters:  01/03/23 161 lb 6.4 oz (73.2 kg)  10/20/22 166 lb 12.8 oz (75.7 kg)  01/14/22 171 lb 3.2 oz (77.7 kg)   - One diminutive polyp in the transverse colon, removed with a cold snare. Resected and retrieved. - External and internal hemorrhoids. - Redundant colon. Extremely so. I could see cecum but not enter deeply despite pressure, abdominal binder, forceps tractiuon and supine position. - The examination was otherwise normal on direct and retroflexion views ULTRASOUND HEPATIC ELASTOGRAPHY:   Median kPa:  8.2   Diagnostic category: < or = 9 kPa: in the absence of other known clinical signs, rules out cACLD      Since that time he had moved back to [place] was seen at [facility] and also seen by Dr. Haroldine Laws and [facility] neurology.  He was diagnosed with cardiac amyloidosis and neuropathy related to amyloidosis.  January 2023 nuclear medicine cardiac amyloid evaluation-visual and quantitative assessment (grade 3, H/CLL = 2.29) are strongly suggestive of transthyretin amyloidosis.  A myeloma panel was negative.  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   His GI complaints are early satiety intermittent anorexia, weight loss (some with diuresis I think) and constipation.  He has had to manually stimulate defecation at times, he has blood on the toilet paper occasionally bright red and not in the bowl.  He has tried a stool softener and is used MiraLAX off and on but not regularly daily on a longstanding basis.  He struggles to defecate with straining.  He also has had hoarseness he saw speech pathology for this as well and ENT thinks some of this may be related to GERD  though he is not having classic heartburn symptoms.  ENT note from January 2024 in February 2024 reviewed.  There is some vague dysphagia to liquids at times and solid foods it sounds like.  No major abdominal pain though he does feel bloated at times.  He is currently on 30 mg lansoprazole daily.  He is quite debilitated and has poor energy levels and cannot be as physically active as he once was.  In December he told Dr. Haroldine Laws he was doing well  and was active and doing ADLs without difficulty.  Allergies  Allergen Reactions   Lipitor [Atorvastatin] Other (See Comments)    Body aches   Penicillins Swelling    Swollen face   Penicillin G     Other reaction(s): Unknown   Current Meds  Medication Sig   allopurinol (ZYLOPRIM) 300 MG tablet Take 300 mg by mouth daily.   Ensure Max Protein (ENSURE MAX PROTEIN) LIQD Take 330 mLs (11 oz total) by mouth at bedtime.   glucose blood (ONETOUCH VERIO) test strip TEST ONCE A DAY DX E11.29   JARDIANCE 10 MG TABS tablet TAKE ONE TABLET BY MOUTH DAILY BEFORE BREAKFAST   lansoprazole (PREVACID) 15 MG capsule Take 30 mg by mouth daily.   loratadine (CLARITIN) 10 MG tablet Take 10 mg by mouth daily as needed for allergies.   magnesium oxide (MAG-OX) 400 MG tablet Take 400 mg by mouth daily.   metFORMIN (GLUCOPHAGE) 500 MG tablet Take 1 tablet (500 mg total) by mouth 2 (two) times daily with a meal.   midodrine (PROAMATINE) 5 MG tablet Take 5 mg by mouth 2 (two) times daily with a meal.   Multiple Vitamins-Minerals (CERTAVITE/ANTIOXIDANTS) TABS Take 1 tablet by mouth daily.   Rivaroxaban (XARELTO) 15 MG TABS tablet Take 15 mg by mouth daily with supper.   rosuvastatin (CRESTOR) 5 MG tablet Take 5 mg by mouth every evening.   spironolactone (ALDACTONE) 25 MG tablet TAKE 1/2 OF A TABLET BY MOUTH DAILY   tadalafil, PAH, (ADCIRCA) 20 MG tablet Take 20 mg by mouth daily as needed (ED).   Tafamidis (VYNDAMAX) 61 MG CAPS Take 1 capsule by mouth daily.    torsemide (DEMADEX) 20 MG tablet Take 20 mg by mouth daily.   vutrisiran sodium (AMVUTTRA) 25 MG/0.5ML syringe Inject 25 mg into the skin every 3 (three) months. Last dose was October 2023   Past Medical History:  Diagnosis Date   Atrial fibrillation [facility])    Coronary artery disease    Diabetes mellitus without complication (HCC)    Diabetic polyneuropathy (HCC)    GERD (gastroesophageal reflux disease)    Gout, unspecified    HLD (hyperlipidemia)    Hx of adenomatous polyp of colon 12/03/2020   Hypertension    OSA on CPAP    Sleep apnea    Past Surgical History:  Procedure Laterality Date   ABLATION     AORTIC VALVE REPLACEMENT  2011   CERVICAL DISC SURGERY     spinal stenosis   COLONOSCOPY     ESOPHAGOGASTRODUODENOSCOPY     RIGHT/LEFT HEART CATH AND CORONARY/GRAFT ANGIOGRAPHY N/A 03/18/2021   Procedure: RIGHT/LEFT HEART CATH AND CORONARY/GRAFT ANGIOGRAPHY;  Surgeon: Jettie Booze, MD;  Location: [place] [facility];  Service: Cardiovascular;  Laterality: N/A;   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   Married 1 son 1 daughter   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   Review of Systems As per HPI  Objective:   Physical Exam '@BP'$  120/66   Pulse 72    Ht '6\' 2"'$  (1.88 m)   Wt 161 lb 6.4 oz (73.2 kg)   BMI 20.72 kg/m @  General:  Thin bm, somewhat chronically ill,  in no acute distress Eyes:  anicteric..  ENT:  hoarse Lungs: Clear to auscultation bilaterally. Heart:  S1S2, no rubs, murmurs, gallops. + JVD Abdomen:  soft, non-tender, no hepatosplenomegaly, hernia, or mass and BS+.  Rectal: Ext hemorrhoids and hard brown stool, no mass Extremities:   no edema, cyanosis or clubbing Skin   no rash. Neuro:  A&O x 3.  Psych:  appropriate mood and  Affect.   Data Reviewed: See HPI

## 2023-01-03 NOTE — Patient Instructions (Addendum)
Your provider has requested that you go to the basement level for lab work before leaving today. Press "B" on the elevator. The lab is located at the first door on the left as you exit the elevator.  Your provider has requested that you have an abdominal x ray before leaving today. Please go to the basement floor to our Radiology department for the test.   You have been scheduled for a Barium Esophogram at [facility] (1st floor of the hospital) on __3/26/2024___ at ____9:00am_. Please arrive 30 minutes prior to your appointment for registration. Make certain not to have anything to eat or drink 3 hours prior to your test. If you need to reschedule for any reason, please contact radiology at [phone] to do so. ______________________________________________________________  You have been scheduled for an abdominal ultrasound at [facility]iology (1st floor of hospital) on ___3/20/2024___ at ___11:00am___. Please arrive 30 minutes prior to your appointment for registration. Make certain not to have anything to eat or drink 6 hours prior to your appointment. Should you need to reschedule your appointment, please contact radiology at [phone]. This test typically takes about 30 minutes to perform.   Due to recent changes in healthcare laws, you may see the results of your imaging and laboratory studies on MyChart before your provider has had a chance to review them.  We understand that in some cases there may be results that are confusing or concerning to you. Not all laboratory results come back in the same time frame and the provider may be waiting for multiple results in order to interpret others.  Please give [place] 48 hours in order for your provider to thoroughly review all the results before contacting the office for clarification of your results. ]  It was a pleasure to see you today!  Thank you for trusting me with your gastrointestinal care!   Silvano Rusk, MD, [facility]

## 2023-01-04 ENCOUNTER — Telehealth: Payer: Self-pay | Admitting: Internal Medicine

## 2023-01-04 NOTE — Telephone Encounter (Signed)
Left message for pt to call back  °

## 2023-01-04 NOTE — Telephone Encounter (Signed)
PT returning call to discuss lab results. Please advise.

## 2023-01-04 NOTE — Telephone Encounter (Signed)
Spoke with pt:  Documented under result notes:  Pt verbalized understanding with all questions answered.   

## 2023-01-10 ENCOUNTER — Ambulatory Visit: Payer: Medicare HMO | Admitting: Speech Pathology

## 2023-01-10 DIAGNOSIS — R498 Other voice and resonance disorders: Secondary | ICD-10-CM | POA: Diagnosis not present

## 2023-01-10 NOTE — Therapy (Addendum)
OUTPATIENT SPEECH LANGUAGE PATHOLOGY VOICE EVALUATION   Patient Name: Frank Berg MRN: [id number] DOB:1956-09-04, 67 y.o., male Today's Date: 01/10/2023  PCP: [name] MD REFERRING PROVIDER: Shon Hale, PA  END OF SESSION:  End of Session - 01/10/23 1017     Visit Number 2    Number of Visits 17    Date for SLP Re-Evaluation 02/27/23    Authorization Type [employer]    SLP Start Time 1015    SLP Stop Time  1100    SLP Time Calculation (min) 45 min    Activity Tolerance Patient tolerated treatment well              Past Medical History:  Diagnosis Date   Atrial fibrillation ([place])    Coronary artery disease    Diabetes mellitus without complication ([place])    Diabetic polyneuropathy ([place])    GERD (gastroesophageal reflux disease)    Gout, unspecified    Heredofamilial amyloidosis (HCC)-TTR, 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   HLD (hyperlipidemia)    Hx of adenomatous polyp of colon 12/03/2020   Hypertension    OSA on CPAP    Sleep apnea    Past Surgical History:  Procedure Laterality Date   ABLATION     AORTIC VALVE REPLACEMENT  2011   CERVICAL DISC SURGERY     spinal stenosis   COLONOSCOPY     ESOPHAGOGASTRODUODENOSCOPY     RIGHT/LEFT HEART CATH AND CORONARY/GRAFT ANGIOGRAPHY N/A 03/18/2021   Procedure: RIGHT/LEFT HEART CATH AND CORONARY/GRAFT ANGIOGRAPHY;  Surgeon: Jettie Booze, MD;  Location: [place] [facility];  Service: Cardiovascular;  Laterality: N/A;   TONSILLECTOMY     Patient Active Problem List   Diagnosis Date Noted   Heredofamilial amyloidosis (HCC)-TTR, 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   Acute respiratory failure with hypoxia (HCC)    Precordial chest pain    SOB (shortness of breath)    Acute CHF (congestive heart failure) ([place]) 12/17/2021   Acute CHF ([place]) 12/17/2021   Abnormal findings on diagnostic imaging of liver and biliary tract 05/25/2021   Benign prostatic hyperplasia with lower urinary tract symptoms [id number]    Diastolic dysfunction [id number]   Ischemic cardiomyopathy 05/25/2021   Long term (current) use of anticoagulants 05/25/2021   Unintentional weight loss 05/25/2021   Decompensated heart failure ([place]) [id number]   Systolic CHF, chronic (HCC)    Hemoptysis 03/11/2021   Near syncope 02/12/2021   CKD (chronic kidney disease), 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   Chronic diastolic CHF (congestive heart failure) ([place]) 02/12/2021   Anticoagulated by anticoagulation treatment 01/05/2021   Bilateral impacted cerumen 01/05/2021   Hx of adenomatous polyp of colon 12/03/2020   Coronary artery disease due to lipid rich plaque 12/27/2018   Atrial fibrillation ([place]) 12/27/2018   Obesity 09/10/2018   Right-sided Bell's palsy 07/30/2018   Raynaud's disease 11/06/2017   Male erectile disorder 09/13/2017   CAD (coronary artery disease) 06/20/2017   Hyperlipidemia 06/20/2017   PAF (paroxysmal atrial fibrillation) ([place]) 06/20/2017   Gout 05/22/2017   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[place]) [id number]   Acute systolic CHF (congestive heart failure) ([place]) 10/10/2016   Elevated troponin 10/09/2016   Benign essential hypertension 09/05/2016   Healthcare maintenance 09/05/2016   Need for immunization against influenza 09/05/2016   Pure hypercholesterolemia 09/05/2016   Typical atrial flutter ([place]) 07/19/2016   S/P cryoablation of arrhythmia 06/02/2016   OSA on CPAP 06/02/2016   Type 2 diabetes mellitus ([place]) 06/02/2016   Bilateral edema of  lower extremity 05/02/2016   Presence of prosthetic heart valve 05/02/2016   DOE (dyspnea on exertion) 05/02/2016    ONSET DATE: 12/28/22 - referral date  REFERRING DIAG: R49.0 (ICD-10-CM) - Hoarseness R49.0 (ICD-10-CM) - Dysphonia  THERAPY DIAG:  Other voice and resonance disorders  Rationale for Evaluation and Treatment: Rehabilitation  SUBJECTIVE:   SUBJECTIVE STATEMENT: "I'm doing okay"  PERTINENT HISTORY: Wolfram saw ENT 11/10/22 and was placed on  reflux meds with no improvement in voice. 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 Yussef is weaning off of reflux meds at this time  PAIN:  Are you having pain? No  FALLS: Has patient fallen in last 6 months?  No  LIVING ENVIRONMENT: Lives with: lives with their spouse Lives in: House/apartment  PLOF:  Level of assistance: Independent with ADLs, Independent with IADLs Employment: Retired  PATIENT GOALS: "To improve my voice to sing again"  OBJECTIVE:   DIAGNOSTIC FINDINGS: Most recent ENT consult Mr. Ewalt returns today for follow-up of hoarseness. He reports that there has not been dramatic improvement since his last visit. He has been taking the Prevacid regularly and trying to watch his diet. He still notes that his voice tends to weaken as the day progresses. He states that some days are just better than others.Flexible laryngoscopy shows patent anterior nasal cavity with minimal crusting, no discharge or infection. Scar band on right side of nasopharynx Normal base of tongue and supraglottis. Omega shaped epiglottis Normal vocal cord mobility without vocal cord nodule, mass, polyp or tumor. Some slight bowing and thinning of true vocal folds. Left arytenoid shows mucous retention cyst as before hypopharynx normal without mass, pooling of secretions or aspiration. Dr. Redmond Baseman was consulted and observed this endoscopy Patient tolerated procedure without complication or difficulty.  Camelia Eng. Spainhour, PA-C  Impression & Plans:   1) hoarseness 2) presbylaryngus 12/22/22:   PATIENT REPORTED OUTCOME MEASURES (PROM): VHI: 67 - severe. Orvel  rated a 4 or "always" for people ask me to repeat myself, the sound of voice varies throughout the day, voice is worse in the evening. He rated a 3, or "almost always" having difficulty being heard, being understood in noisy room, being heard in another room, feeling strain to produce voice, using much effort to speak, inconsistent  clarity  TODAY'S TREATMENT:  01/10/23: With mod-I, pt recalled previously reviewed voice techniques. Pt endorsed that he had been practicing in the car. SLP introduced SOVT exercises and educated pt on benefits, provided model on how to complete exercises. Pt then demonstrated understanding by completing SOVT exercises with and without voicing.    "Ah"- 75 dB  Sentence reading- 77 - 80 dB  Provided faded model, pt demonstrated back PhoRTE vocal exercises with appropriate volume. Given a category, pt named three items belonging to each while practicing maintaining target vocal quality (reduced hoarseness, increased volume). Provided occasional verbal cue, pt achieved target vocal quality and was able to self evaluate when he used appropriate quality.  HEP: 5-10 times each SOVT exercise as warm up  01/02/23 (eval day): As Draelyn was stimulable for clear voice and normal volume with cues for breath support and volume and his dx with bowing and thinning of vocal folds, I initiated PhoRTE with usual min to mod A for volume, pitch glides and high/low pitch sentences. Durelle achieved clear phonation and WNL volume, Ongoing verbal cues to ID change in voice quality when he speaks with intent. He denies feeling strain or vocal fatigue after HEP training.   PATIENT  EDUCATION: Education details: see today's treatment and patient instructions, HEP for voice Person educated: Patient Education method: Explanation, Demonstration, Verbal cues, and Handouts Education comprehension: returned demonstration, verbal cues required, and needs further education  HOME EXERCISE PROGRAM: PhoRTE   GOALS: Goals reviewed with patient? Yes  SHORT TERM GOALS: Target date: 01/30/23  Pt will complete HEP for voice with rare min A over 2 sessions Baseline: Goal status: IN PROGRESS  2.  Pt will achieve 80dB on loud Ah and 72dB on 18/20 sentences with rare min A Baseline:  Goal status: IN PROGRESS  3.  Pt will average  70dB with clear phonation over 5 minutes of simple conversation with rare min A Baseline:  Goal status: IN PROGRESS  4.  Pt will verbalize reflux precautions with rare min A Baseline:  Goal status: IN PROGRESS  5.  Pt will be audible in mildly noise environment with clear phonation over 5 minute conversation with occasional min A Baseline:  Goal status: IN PROGRESS  6. Pt will eliminate throat clears and demonstrate throat clear alternatives with rare min A           Goal Status: INITIAL   LONG TERM GOALS: Target date: 02/27/23  Pt will complete HEP for voice with mod I Baseline:  Goal status: IN PROGRESS  2.  Pt will average 70dB with clear phonation over 15 minute conversation with rare min A Baseline:  Goal status: IN PROGRESS  3.  Pt will be intelligible in noisy environment over 10 minute conversation or while walking out side with rare min A Baseline:  Goal status: IN PROGRESS  4.  Pt will report 50% less requests for repetition (subjectively) over 1 week Baseline:  Goal status: IN PROGRESS  5.  Pt will improve score on Voice Handicap Index by 8 points Baseline:  Goal status: IN PROGRESS ASSESSMENT:  CLINICAL IMPRESSION: Patient is a 67 y.o. male who was seen today for moderate voice disorder. He reports his spouse and family frequently don't understand him. Volume quite low, averaging 55dB (70 is WNL) and hoarse. Teyo  reports some depression and 70lb weight loss due to dx of hereditary amyloidosis and divorce. He used to play pickle ball and exercise regularly. At this time, he is not socializing much and is not talking to many people. He mainly stays home.When volume targeted, Terreon demonstrated improved phonation and intelligibility. He denied any laryngeal tension or strain with exuberant voice trials. He endorses frequent throat clearing, however I observed this only 2x today. Jaylani is a good candidate for voice therapy. I recommend skilled ST to maximize  intelligibility for QOL, safety and life participation.   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 These impairments are limiting patient from effectively communicating at home and in community.Factors affecting potential to achieve goals and functional outcome are  N/A .Marland Kitchen Patient will benefit from skilled SLP services to address above impairments and improve overall function.  REHAB POTENTIAL: Good  PLAN:  SLP FREQUENCY: 2x/week  SLP DURATION: 8 weeks  PLANNED INTERVENTIONS: Environmental controls, Cueing hierachy, Internal/external aids, Multimodal communication approach, SLP instruction and feedback, and Compensatory strategies, possible EMST   Su Monks, CCC-SLP 01/10/2023, 2:19 PM

## 2023-01-10 NOTE — Patient Instructions (Addendum)
  Semi-occluded vocal tract exercises (SOVTE)  These allow your vocal folds to vibrate without excess tension and promotes high placement of the voice  Use SOVTE as a warm up before prolonged speaking and vocal exercises  Watch Vocal Straw Exercises with Lolita Cram on YouTube:  [email or web address]  Deep breath, hum into straw 5-10 times   Accents (siren) 5-10 times   Hum the [employer] (or a song you prefer)  A goal would be 2-3 minutes several times a day and prior to vocal exercises  As always, use good belly breathing while completing SOVTE   Vocal Exercises:  Loud "ah" - Take nice deep breath, wait 15-20 seconds between each  "Whoop" pitch glide (start low pitch, glide to high pitch, then go back to low)   Then hold glide at high pitch, then hold at low pitch

## 2023-01-11 ENCOUNTER — Ambulatory Visit (HOSPITAL_COMMUNITY)
Admission: RE | Admit: 2023-01-11 | Discharge: 2023-01-11 | Disposition: A | Discharge status: Home / Self Care | Payer: Medicare HMO | Source: Ambulatory Visit | Attending: Internal Medicine | Admitting: Internal Medicine

## 2023-01-11 DIAGNOSIS — R932 Abnormal findings on diagnostic imaging of liver and biliary tract: Secondary | ICD-10-CM | POA: Diagnosis not present

## 2023-01-12 ENCOUNTER — Ambulatory Visit: Payer: Medicare HMO | Admitting: Speech Pathology

## 2023-01-12 DIAGNOSIS — R498 Other voice and resonance disorders: Secondary | ICD-10-CM | POA: Diagnosis not present

## 2023-01-12 NOTE — Patient Instructions (Signed)
HOME EXERCISES -- DAILY  Straw Deep breath, sustained hum x10 Deep breath, pitch glides x10 Hum a song Voicing exercises Deep breath, AHHHHH as long as you can x10 Deep breath, pitch glides up and down (hold at top and bottom) x10 Oral reading Pick something to read and practice using your exuberant voice  PRACTICE IN CONVERSATION Think about HOW you are speaking. GOOD VOICE!!  Some ideas:  Say hello to three people Make a phone call to order food Introduce yourself to someone Call 2 businesses and ask what time they close Leave yourself a voicemail  Tell someone a joke Have a three minute phone conversation with friend or family member Read a book to a child Order food at [employer]

## 2023-01-12 NOTE — Therapy (Signed)
OUTPATIENT SPEECH LANGUAGE PATHOLOGY TREATMENT   Patient Name: Frank Berg MRN: [id number] DOB:[birth year], 67 y.o., male Today's Date: 01/12/2023  PCP: [name] MD REFERRING PROVIDER: Shon Hale, PA  END OF SESSION:  End of Session - 01/12/23 1111     Visit Number 3    Number of Visits 17    Date for SLP Re-Evaluation 02/27/23    Authorization Type [employer]    SLP Start Time 1100    SLP Stop Time  [id number]    SLP Time Calculation (min) 45 min    Activity Tolerance Patient tolerated treatment well             Past Medical History:  Diagnosis Date   Atrial fibrillation ([place])    Coronary artery disease    Diabetes mellitus without complication ([place])    Diabetic polyneuropathy ([place])    GERD (gastroesophageal reflux disease)    Gout, unspecified    Heredofamilial amyloidosis (HCC)-TTR, 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   HLD (hyperlipidemia)    Hx of adenomatous polyp of colon 12/03/2020   Hypertension    OSA on CPAP    Sleep apnea    Past Surgical History:  Procedure Laterality Date   ABLATION     AORTIC VALVE REPLACEMENT  2011   CERVICAL DISC SURGERY     spinal stenosis   COLONOSCOPY     ESOPHAGOGASTRODUODENOSCOPY     RIGHT/LEFT HEART CATH AND CORONARY/GRAFT ANGIOGRAPHY N/A 03/18/2021   Procedure: RIGHT/LEFT HEART CATH AND CORONARY/GRAFT ANGIOGRAPHY;  Surgeon: Jettie Booze, MD;  Location: [facility];  Service: Cardiovascular;  Laterality: N/A;   TONSILLECTOMY     Patient Active Problem List   Diagnosis Date Noted   Heredofamilial amyloidosis (HCC)-TTR, 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   Acute respiratory failure with hypoxia (HCC)    Precordial chest pain    SOB (shortness of breath)    Acute CHF (congestive heart failure) ([place]) 12/17/2021   Acute CHF ([place]) 12/17/2021   Abnormal findings on diagnostic imaging of liver and biliary tract 05/25/2021   Benign prostatic hyperplasia with lower urinary tract symptoms [id number]   Diastolic  dysfunction [id number]   Ischemic cardiomyopathy 05/25/2021   Long term (current) use of anticoagulants 05/25/2021   Unintentional weight loss 05/25/2021   Decompensated heart failure ([place]) [id number]   Systolic CHF, chronic (HCC)    Hemoptysis 03/11/2021   Near syncope 02/12/2021   CKD (chronic kidney disease), 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   Chronic diastolic CHF (congestive heart failure) ([place]) 02/12/2021   Anticoagulated by anticoagulation treatment 01/05/2021   Bilateral impacted cerumen 01/05/2021   Hx of adenomatous polyp of colon 12/03/2020   Coronary artery disease due to lipid rich plaque 12/27/2018   Atrial fibrillation ([place]) 12/27/2018   Obesity 09/10/2018   Right-sided Bell's palsy 07/30/2018   Raynaud's disease 11/06/2017   Male erectile disorder 09/13/2017   CAD (coronary artery disease) 06/20/2017   Hyperlipidemia 06/20/2017   PAF (paroxysmal atrial fibrillation) ([place]) 06/20/2017   Gout 05/22/2017   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[place]) [id number]   Acute systolic CHF (congestive heart failure) ([place]) 10/10/2016   Elevated troponin 10/09/2016   Benign essential hypertension 09/05/2016   Healthcare maintenance 09/05/2016   Need for immunization against influenza 09/05/2016   Pure hypercholesterolemia 09/05/2016   Typical atrial flutter ([place]) 07/19/2016   S/P cryoablation of arrhythmia 06/02/2016   OSA on CPAP 06/02/2016   Type 2 diabetes mellitus ([place]) 06/02/2016   Bilateral edema of lower extremity  05/02/2016   Presence of prosthetic heart valve 05/02/2016   DOE (dyspnea on exertion) 05/02/2016    ONSET DATE: 12/28/22 - referral date  REFERRING DIAG: R49.0 (ICD-10-CM) - Hoarseness R49.0 (ICD-10-CM) - Dysphonia  THERAPY DIAG:  Other voice and resonance disorders  Rationale for Evaluation and Treatment: Rehabilitation  SUBJECTIVE:   SUBJECTIVE STATEMENT: "I'm good"   PERTINENT HISTORY: Frank Berg saw ENT 11/10/22 and was placed on reflux meds with no  improvement in voice. 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 Frank Berg is weaning off of reflux meds at this time  PAIN:  Are you having pain? No  FALLS: Has patient fallen in last 6 months?  No  LIVING ENVIRONMENT: Lives with: lives with their spouse Lives in: House/apartment  PLOF:  Level of assistance: Independent with ADLs, Independent with IADLs Employment: Retired  PATIENT GOALS: "To improve my voice to sing again"  OBJECTIVE:   DIAGNOSTIC FINDINGS: Most recent ENT consult Frank Berg returns today for follow-up of hoarseness. He reports that there has not been dramatic improvement since his last visit. He has been taking the Prevacid regularly and trying to watch his diet. He still notes that his voice tends to weaken as the day progresses. He states that some days are just better than others.Flexible laryngoscopy shows patent anterior nasal cavity with minimal crusting, no discharge or infection. Scar band on right side of nasopharynx Normal base of tongue and supraglottis. Omega shaped epiglottis Normal vocal cord mobility without vocal cord nodule, mass, polyp or tumor. Some slight bowing and thinning of true vocal folds. Left arytenoid shows mucous retention cyst as before hypopharynx normal without mass, pooling of secretions or aspiration. Dr. Redmond Baseman was consulted and observed this endoscopy Patient tolerated procedure without complication or difficulty.  Frank Eng. Spainhour, PA-C  Impression & Plans:   1) hoarseness 2) presbylaryngus 12/22/22:   PATIENT REPORTED OUTCOME MEASURES (PROM): VHI: 67 - severe. [name]  rated a 4 or "always" for people ask me to repeat myself, the sound of voice varies throughout the day, voice is worse in the evening. He rated a 3, or "almost always" having difficulty being heard, being understood in noisy room, being heard in another room, feeling strain to produce voice, using much effort to speak, inconsistent clarity  TODAY'S  TREATMENT:  01/12/23: Ongoing education for vocal warm ups, vocal function exercises, and conversation training techniqyes. SLP reviewed SOVT an vocal exercises with pt. Pt required occasional model, but was highly stimulable and demonstrated consistent accuracy following provided model. Pt with good volume and vocal clarity compared to baseline during phrase level speech while reading, required rare min-A for consistency. Given, occasional verbal cue during structured conversational task, pt maintained good vocal quality, although voice returned to baseline between tasks.   01/10/23: With mod-I, pt recalled previously reviewed voice techniques. Pt endorsed that he had been practicing in the car. SLP introduced SOVT exercises and educated pt on benefits, provided model on how to complete exercises. Pt then demonstrated understanding by completing SOVT exercises with and without voicing.    "Ah"- 75 dB  Sentence reading- 77 - 80 dB  Provided faded model, pt demonstrated back PhoRTE vocal exercises with appropriate volume. Given a category, pt named three items belonging to each while practicing maintaining target vocal quality (reduced hoarseness, increased volume). Provided occasional verbal cue, pt achieved target vocal quality and was able to self evaluate when he used appropriate quality.  HEP: 5-10 times each SOVT exercise as warm up  01/02/23 (eval day):  As Eyasu was stimulable for clear voice and normal volume with cues for breath support and volume and his dx with bowing and thinning of vocal folds, I initiated PhoRTE with usual min to mod A for volume, pitch glides and high/low pitch sentences. Lisandro achieved clear phonation and WNL volume, Ongoing verbal cues to ID change in voice quality when he speaks with intent. He denies feeling strain or vocal fatigue after HEP training.   PATIENT EDUCATION: Education details: see today's treatment and patient instructions, HEP for voice Person  educated: Patient Education method: Explanation, Demonstration, Verbal cues, and Handouts Education comprehension: returned demonstration, verbal cues required, and needs further education  HOME EXERCISE PROGRAM: PhoRTE   GOALS: Goals reviewed with patient? Yes  SHORT TERM GOALS: Target date: 01/30/23  Pt will complete HEP for voice with rare min A over 2 sessions Baseline: Goal status: MET  2.  Pt will achieve 80dB on loud Ah and 72dB on 18/20 sentences with rare min A Baseline:  Goal status: IN PROGRESS  3.  Pt will average 70dB with clear phonation over 5 minutes of simple conversation with rare min A Baseline:  Goal status: IN PROGRESS  4.  Pt will verbalize reflux precautions with rare min A Baseline:  Goal status: IN PROGRESS  5.  Pt will be audible in mildly noise environment with clear phonation over 5 minute conversation with occasional min A Baseline:  Goal status: IN PROGRESS  6. Pt will eliminate throat clears and demonstrate throat clear alternatives with rare min A           Goal Status: IN PROGRESS   LONG TERM GOALS: Target date: 02/27/23  Pt will complete HEP for voice with mod I Baseline:  Goal status: MET  2.  Pt will average 70dB with clear phonation over 15 minute conversation with rare min A Baseline:  Goal status: IN PROGRESS  3.  Pt will be intelligible in noisy environment over 10 minute conversation or while walking out side with rare min A Baseline:  Goal status: IN PROGRESS  4.  Pt will report 50% less requests for repetition (subjectively) over 1 week Baseline:  Goal status: IN PROGRESS  5.  Pt will improve score on Voice Handicap Index by 8 points Baseline:  Goal status: IN PROGRESS ASSESSMENT:  CLINICAL IMPRESSION: Patient is a 67 y.o. male who was seen today for moderate voice disorder. SOVT and vocal exercises reviewed today. When volume targeted, Aakarsh demonstrated improved phonation and intelligibility. I continue to  recommend skilled ST to maximize intelligibility for QOL, safety and life participation.   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 These impairments are limiting patient from effectively communicating at home and in community.Factors affecting potential to achieve goals and functional outcome are  N/A .Marland Kitchen Patient will benefit from skilled SLP services to address above impairments and improve overall function.  REHAB POTENTIAL: Good  PLAN:  SLP FREQUENCY: 2x/week  SLP DURATION: 8 weeks  PLANNED INTERVENTIONS: Environmental controls, Cueing hierachy, Internal/external aids, Multimodal communication approach, SLP instruction and feedback, and Compensatory strategies, possible EMST   Leroy Libman, Student-SLP 01/12/2023, 11:12 AM

## 2023-01-16 ENCOUNTER — Telehealth: Payer: Self-pay | Admitting: Internal Medicine

## 2023-01-16 ENCOUNTER — Telehealth (HOSPITAL_COMMUNITY): Payer: Self-pay | Admitting: Cardiology

## 2023-01-16 DIAGNOSIS — K838 Other specified diseases of biliary tract: Secondary | ICD-10-CM

## 2023-01-16 NOTE — Telephone Encounter (Signed)
Patient called to report chest tightness off and on over the past 2 days Chest tightness is accompanied by unexplained rash on trunk  Denies SOB, active chest pain  During the course of the call pt report he would report to walk in/urgent care if symptoms worsen

## 2023-01-16 NOTE — Telephone Encounter (Signed)
Patient is returning your call.  

## 2023-01-16 NOTE — Telephone Encounter (Signed)
PT is calling to go over [place] results. Please advise.

## 2023-01-16 NOTE — Telephone Encounter (Signed)
Left message for pt to call back  °

## 2023-01-17 ENCOUNTER — Ambulatory Visit: Payer: Medicare HMO | Admitting: Speech Pathology

## 2023-01-17 ENCOUNTER — Ambulatory Visit (HOSPITAL_COMMUNITY): Admission: RE | Admit: 2023-01-17 | Payer: Medicare HMO | Source: Ambulatory Visit

## 2023-01-17 DIAGNOSIS — R498 Other voice and resonance disorders: Secondary | ICD-10-CM

## 2023-01-17 NOTE — Telephone Encounter (Signed)
Left message for pt to call back  °

## 2023-01-17 NOTE — Therapy (Signed)
OUTPATIENT SPEECH LANGUAGE PATHOLOGY TREATMENT   Patient Name: Frank Berg MRN: [id number] DOB:[birth year], 67 y.o., male Today's Date: 01/17/2023  PCP: [name] MD REFERRING PROVIDER: Shon Hale, PA  END OF SESSION:  End of Session - 01/17/23 1020     Visit Number 4    Number of Visits 17    Date for SLP Re-Evaluation 02/27/23    Authorization Type [employer] Medicare    SLP Start Time 1020   pt arrived late   SLP Stop Time  1100    SLP Time Calculation (min) 40 min    Activity Tolerance Patient tolerated treatment well              Past Medical History:  Diagnosis Date   Atrial fibrillation ([place])    Coronary artery disease    Diabetes mellitus without complication ([place])    Diabetic polyneuropathy ([place])    GERD (gastroesophageal reflux disease)    Gout, unspecified    Heredofamilial amyloidosis (HCC)-TTR, 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   HLD (hyperlipidemia)    Hx of adenomatous polyp of colon 12/03/2020   Hypertension    OSA on CPAP    Sleep apnea    Past Surgical History:  Procedure Laterality Date   ABLATION     AORTIC VALVE REPLACEMENT  2011   CERVICAL DISC SURGERY     spinal stenosis   COLONOSCOPY     ESOPHAGOGASTRODUODENOSCOPY     RIGHT/LEFT HEART CATH AND CORONARY/GRAFT ANGIOGRAPHY N/A 03/18/2021   Procedure: RIGHT/LEFT HEART CATH AND CORONARY/GRAFT ANGIOGRAPHY;  Surgeon: Jettie Booze, MD;  Location: [facility];  Service: Cardiovascular;  Laterality: N/A;   TONSILLECTOMY     Patient Active Problem List   Diagnosis Date Noted   Heredofamilial amyloidosis (HCC)-TTR, 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   Acute respiratory failure with hypoxia (HCC)    Precordial chest pain    SOB (shortness of breath)    Acute CHF (congestive heart failure) ([place]) 12/17/2021   Acute CHF ([place]) 12/17/2021   Abnormal findings on diagnostic imaging of liver and biliary tract 05/25/2021   Benign prostatic hyperplasia with lower urinary tract symptoms  [id number]   Diastolic dysfunction [id number]   Ischemic cardiomyopathy 05/25/2021   Long term (current) use of anticoagulants 05/25/2021   Unintentional weight loss 05/25/2021   Decompensated heart failure ([place]) [id number]   Systolic CHF, chronic (HCC)    Hemoptysis 03/11/2021   Near syncope 02/12/2021   CKD (chronic kidney disease), 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   Chronic diastolic CHF (congestive heart failure) ([place]) 02/12/2021   Anticoagulated by anticoagulation treatment 01/05/2021   Bilateral impacted cerumen 01/05/2021   Hx of adenomatous polyp of colon 12/03/2020   Coronary artery disease due to lipid rich plaque 12/27/2018   Atrial fibrillation ([place]) 12/27/2018   Obesity 09/10/2018   Right-sided Bell's palsy 07/30/2018   Raynaud's disease 11/06/2017   Male erectile disorder 09/13/2017   CAD (coronary artery disease) 06/20/2017   Hyperlipidemia 06/20/2017   PAF (paroxysmal atrial fibrillation) ([place]) 06/20/2017   Gout 05/22/2017   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[place]) [id number]   Acute systolic CHF (congestive heart failure) ([place]) 10/10/2016   Elevated troponin 10/09/2016   Benign essential hypertension 09/05/2016   Healthcare maintenance 09/05/2016   Need for immunization against influenza 09/05/2016   Pure hypercholesterolemia 09/05/2016   Typical atrial flutter ([place]) 07/19/2016   S/P cryoablation of arrhythmia 06/02/2016   OSA on CPAP 06/02/2016   Type 2 diabetes mellitus ([place]) 06/02/2016  Bilateral edema of lower extremity 05/02/2016   Presence of prosthetic heart valve 05/02/2016   DOE (dyspnea on exertion) 05/02/2016    ONSET DATE: 12/28/22 - referral date  REFERRING DIAG: R49.0 (ICD-10-CM) - Hoarseness R49.0 (ICD-10-CM) - Dysphonia  THERAPY DIAG:  Other voice and resonance disorders  Rationale for Evaluation and Treatment: Rehabilitation  SUBJECTIVE:   SUBJECTIVE STATEMENT: "I have" re: HEP completion   PERTINENT HISTORY: Frank Berg saw ENT  11/10/22 and was placed on reflux meds with no improvement in voice. 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 Frank Berg is weaning off of reflux meds at this time  PAIN:  Are you having pain? No  FALLS: Has patient fallen in last 6 months?  No  LIVING ENVIRONMENT: Lives with: lives with their spouse Lives in: House/apartment  PLOF:  Level of assistance: Independent with ADLs, Independent with IADLs Employment: Retired  PATIENT GOALS: "To improve my voice to sing again"  OBJECTIVE:   DIAGNOSTIC FINDINGS: Most recent ENT consult Frank Berg returns today for follow-up of hoarseness. He reports that there has not been dramatic improvement since his last visit. He has been taking the Prevacid regularly and trying to watch his diet. He still notes that his voice tends to weaken as the day progresses. He states that some days are just better than others.Flexible laryngoscopy shows patent anterior nasal cavity with minimal crusting, no discharge or infection. Scar band on right side of nasopharynx Normal base of tongue and supraglottis. Omega shaped epiglottis Normal vocal cord mobility without vocal cord nodule, mass, polyp or tumor. Some slight bowing and thinning of true vocal folds. Left arytenoid shows mucous retention cyst as before hypopharynx normal without mass, pooling of secretions or aspiration. Dr. Redmond Baseman was consulted and observed this endoscopy Patient tolerated procedure without complication or difficulty.  Camelia Eng. Spainhour, PA-C  Impression & Plans:   1) hoarseness 2) presbylaryngus   PATIENT REPORTED OUTCOME MEASURES (PROM): VHI: 67 - severe. Izear  rated a 4 or "always" for people ask me to repeat myself, the sound of voice varies throughout the day, voice is worse in the evening. He rated a 3, or "almost always" having difficulty being heard, being understood in noisy room, being heard in another room, feeling strain to produce voice, using much effort to  speak, inconsistent clarity  TODAY'S TREATMENT:  01/17/23: Pt reporting increasing self-aawarenss of voice and reports ability to self-correct. Ongoing education on nature of dysphonia and need for practice to A in generalization of targeted strategies. SLP led pt through voice exercises per prior sessions.  SOVTE: up to song level, with occasional min-A for use of lower abdominal breathing and increasing frequency of breathing during song task.  Voca function exercises: usual model required. min-A for accuracy of glides, model required for sustained phonation at low, mid, high.  PHoRTE: increased volume and clarity noted at phrase level, able to complete with mod-I, demonstrating self-correction when initial attempt presents with hoarseness Target carryover during moderately complex discourse level task, with pt demonstrating improved clarity and vocal intensity in ~70% of utterances with usual-A.   01/12/23: Ongoing education for vocal warm ups, vocal function exercises, and conversation training techniqyes. SLP reviewed SOVT an vocal exercises with pt. Pt required occasional model, but was highly stimulable and demonstrated consistent accuracy following provided model. Pt with good volume and vocal clarity compared to baseline during phrase level speech while reading, required rare min-A for consistency. Given, occasional verbal cue during structured conversational task, pt maintained good vocal quality,  although voice returned to baseline between tasks.   01/10/23: With mod-I, pt recalled previously reviewed voice techniques. Pt endorsed that he had been practicing in the car. SLP introduced SOVT exercises and educated pt on benefits, provided model on how to complete exercises. Pt then demonstrated understanding by completing SOVT exercises with and without voicing.    "Ah"- 75 dB  Sentence reading- 77 - 80 dB  Provided faded model, pt demonstrated back PhoRTE vocal exercises with appropriate  volume. Given a category, pt named three items belonging to each while practicing maintaining target vocal quality (reduced hoarseness, increased volume). Provided occasional verbal cue, pt achieved target vocal quality and was able to self evaluate when he used appropriate quality.  HEP: 5-10 times each SOVT exercise as warm up  01/02/23 (eval day): As Adonay was stimulable for clear voice and normal volume with cues for breath support and volume and his dx with bowing and thinning of vocal folds, I initiated PhoRTE with usual min to mod A for volume, pitch glides and high/low pitch sentences. Dmon achieved clear phonation and WNL volume, Ongoing verbal cues to ID change in voice quality when he speaks with intent. He denies feeling strain or vocal fatigue after HEP training.   PATIENT EDUCATION: Education details: see today's treatment and patient instructions, HEP for voice Person educated: Patient Education method: Explanation, Demonstration, Verbal cues, and Handouts Education comprehension: returned demonstration, verbal cues required, and needs further education  HOME EXERCISE PROGRAM: PhoRTE   GOALS: Goals reviewed with patient? Yes  SHORT TERM GOALS: Target date: 01/30/23  Pt will complete HEP for voice with rare min A over 2 sessions Baseline: Goal status: MET  2.  Pt will achieve 80dB on loud Ah and 72dB on 18/20 sentences with rare min A Baseline:  Goal status: IN PROGRESS  3.  Pt will average 70dB with clear phonation over 5 minutes of simple conversation with rare min A Baseline:  Goal status: IN PROGRESS  4.  Pt will verbalize reflux precautions with rare min A Baseline:  Goal status: IN PROGRESS  5.  Pt will be audible in mildly noise environment with clear phonation over 5 minute conversation with occasional min A Baseline:  Goal status: IN PROGRESS  6. Pt will eliminate throat clears and demonstrate throat clear alternatives with rare min A           Goal  Status: IN PROGRESS   LONG TERM GOALS: Target date: 02/27/23  Pt will complete HEP for voice with mod I Baseline:  Goal status: MET  2.  Pt will average 70dB with clear phonation over 15 minute conversation with rare min A Baseline:  Goal status: IN PROGRESS  3.  Pt will be intelligible in noisy environment over 10 minute conversation or while walking out side with rare min A Baseline:  Goal status: IN PROGRESS  4.  Pt will report 50% less requests for repetition (subjectively) over 1 week Baseline:  Goal status: IN PROGRESS  5.  Pt will improve score on Voice Handicap Index by 8 points Baseline:  Goal status: IN PROGRESS ASSESSMENT:  CLINICAL IMPRESSION: Patient is a 67 y.o. male who was seen today for moderate voice disorder. SOVT and vocal exercises reviewed today. When volume targeted, Chasse demonstrated improved phonation and intelligibility. I continue to recommend skilled ST to maximize intelligibility for QOL, safety and life participation.   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 These impairments are limiting patient from effectively communicating at  home and in community.Factors affecting potential to achieve goals and functional outcome are  N/A .Marland Kitchen Patient will benefit from skilled SLP services to address above impairments and improve overall function.  REHAB POTENTIAL: Good  PLAN:  SLP FREQUENCY: 2x/week  SLP DURATION: 8 weeks  PLANNED INTERVENTIONS: Environmental controls, Cueing hierachy, Internal/external aids, Multimodal communication approach, SLP instruction and feedback, and Compensatory strategies, possible EMST   Su Monks, CCC-SLP 01/17/2023, 10:20 AM

## 2023-01-17 NOTE — Telephone Encounter (Signed)
Pt requesting [place] results. Chart reviewed. Pt was notified as soon as the provider reviews the results and makes recommendations if any  then we will contact him and make him aware.  Pt verbalized understanding with all questions answered.

## 2023-01-17 NOTE — Telephone Encounter (Signed)
[facility] is calling back requesting follow up on [place] results.

## 2023-01-17 NOTE — Telephone Encounter (Signed)
Findings below suggest cirrhosis of liver though maybe not in my opinion  The bile duct dilation does require evaluation with MRCP  Please do following:  #1 Order MRCP - no contrast due to abnormal kidney function   Encounter Diagnosis  Name Primary?   Dilated bile duct Yes   # 2 Frank Berg was supposed to do ba swallow today but not resulted/done that I can tell - please clarify     US IMPRESSION: 1. The gallbladder wall is thickened measuring 3.6 mm. The gallbladder is otherwise normal. 2. The common bile duct is dilated measuring 7.8 mm. Mild intrahepatic ductal dilatation centrally. Recommend correlation with LFTs. If there is concern for biliary obstruction, recommend MRCP or ERCP. 3. Cirrhotic liver. No discrete mass. 4. There appears to be bidirectional flow in the portal vein.

## 2023-01-18 ENCOUNTER — Other Ambulatory Visit: Payer: Self-pay

## 2023-01-18 DIAGNOSIS — K838 Other specified diseases of biliary tract: Secondary | ICD-10-CM

## 2023-01-18 DIAGNOSIS — R932 Abnormal findings on diagnostic imaging of liver and biliary tract: Secondary | ICD-10-CM

## 2023-01-18 NOTE — Telephone Encounter (Unsigned)
Pt was notified of recent results and Dr. Carlean Purl recommendations: Pt scheduled for MRCP on 01/21/2023 3:00 PM  at [facility], Pt to arrive at 2:30 PM  through the ED. Pt to be NPO starting at 11:00 AM. Pt made aware  Pt stated that he was unaware of the barium swallow test that was ordered for him. Pt was given the number to radiology to reschedule.   Pt verbalized understanding with all questions answered.

## 2023-01-19 ENCOUNTER — Encounter (HOSPITAL_COMMUNITY): Payer: Self-pay | Admitting: Internal Medicine

## 2023-01-19 ENCOUNTER — Ambulatory Visit: Payer: Medicare HMO | Admitting: Speech Pathology

## 2023-01-19 ENCOUNTER — Other Ambulatory Visit (HOSPITAL_COMMUNITY): Payer: Self-pay | Admitting: Internal Medicine

## 2023-01-19 ENCOUNTER — Ambulatory Visit (HOSPITAL_COMMUNITY)
Admission: RE | Admit: 2023-01-19 | Discharge: 2023-01-19 | Disposition: A | Discharge status: Home / Self Care | Payer: Medicare HMO | Source: Ambulatory Visit | Attending: Internal Medicine | Admitting: Internal Medicine

## 2023-01-19 VITALS — BP 100/74 | HR 63 | Wt 161.8 lb

## 2023-01-19 DIAGNOSIS — I480 Paroxysmal atrial fibrillation: Secondary | ICD-10-CM | POA: Diagnosis not present

## 2023-01-19 DIAGNOSIS — I2510 Atherosclerotic heart disease of native coronary artery without angina pectoris: Secondary | ICD-10-CM | POA: Diagnosis not present

## 2023-01-19 DIAGNOSIS — I43 Cardiomyopathy in diseases classified elsewhere: Secondary | ICD-10-CM | POA: Insufficient documentation

## 2023-01-19 DIAGNOSIS — E1122 Type 2 diabetes mellitus with diabetic chronic kidney disease: Secondary | ICD-10-CM | POA: Insufficient documentation

## 2023-01-19 DIAGNOSIS — I130 Hypertensive heart and chronic kidney disease with heart failure and stage 1 through stage 4 chronic kidney disease, or unspecified chronic kidney disease: Secondary | ICD-10-CM | POA: Insufficient documentation

## 2023-01-19 DIAGNOSIS — N1831 Chronic kidney disease, stage 3a: Secondary | ICD-10-CM

## 2023-01-19 DIAGNOSIS — I498 Other specified cardiac arrhythmias: Secondary | ICD-10-CM

## 2023-01-19 DIAGNOSIS — Z87891 Personal history of nicotine dependence: Secondary | ICD-10-CM | POA: Insufficient documentation

## 2023-01-19 DIAGNOSIS — Z7984 Long term (current) use of oral hypoglycemic drugs: Secondary | ICD-10-CM | POA: Diagnosis not present

## 2023-01-19 DIAGNOSIS — Z955 Presence of coronary angioplasty implant and graft: Secondary | ICD-10-CM | POA: Diagnosis not present

## 2023-01-19 DIAGNOSIS — I5022 Chronic systolic (congestive) heart failure: Secondary | ICD-10-CM

## 2023-01-19 DIAGNOSIS — I5032 Chronic diastolic (congestive) heart failure: Secondary | ICD-10-CM

## 2023-01-19 DIAGNOSIS — Z79899 Other long term (current) drug therapy: Secondary | ICD-10-CM | POA: Insufficient documentation

## 2023-01-19 DIAGNOSIS — Z8719 Personal history of other diseases of the digestive system: Secondary | ICD-10-CM | POA: Diagnosis not present

## 2023-01-19 DIAGNOSIS — E854 Organ-limited amyloidosis: Secondary | ICD-10-CM | POA: Diagnosis not present

## 2023-01-19 DIAGNOSIS — Z953 Presence of xenogenic heart valve: Secondary | ICD-10-CM | POA: Diagnosis not present

## 2023-01-19 DIAGNOSIS — Z7901 Long term (current) use of anticoagulants: Secondary | ICD-10-CM | POA: Insufficient documentation

## 2023-01-19 DIAGNOSIS — E785 Hyperlipidemia, unspecified: Secondary | ICD-10-CM | POA: Insufficient documentation

## 2023-01-19 DIAGNOSIS — Z86010 Personal history of colonic polyps: Secondary | ICD-10-CM | POA: Diagnosis not present

## 2023-01-19 DIAGNOSIS — G4733 Obstructive sleep apnea (adult) (pediatric): Secondary | ICD-10-CM | POA: Diagnosis not present

## 2023-01-19 DIAGNOSIS — K219 Gastro-esophageal reflux disease without esophagitis: Secondary | ICD-10-CM | POA: Diagnosis not present

## 2023-01-19 DIAGNOSIS — Z952 Presence of prosthetic heart valve: Secondary | ICD-10-CM

## 2023-01-19 DIAGNOSIS — Z951 Presence of aortocoronary bypass graft: Secondary | ICD-10-CM | POA: Insufficient documentation

## 2023-01-19 DIAGNOSIS — I951 Orthostatic hypotension: Secondary | ICD-10-CM | POA: Diagnosis not present

## 2023-01-19 DIAGNOSIS — M109 Gout, unspecified: Secondary | ICD-10-CM | POA: Insufficient documentation

## 2023-01-19 LAB — BRAIN NATRIURETIC PEPTIDE: B Natriuretic Peptide: 796.9 pg/mL — ABNORMAL HIGH (ref 0.0–100.0)

## 2023-01-19 LAB — BASIC METABOLIC PANEL
Anion gap: 16 — ABNORMAL HIGH (ref 5–15)
BUN: 57 mg/dL — ABNORMAL HIGH (ref 8–23)
CO2: 22 mmol/L (ref 22–32)
Calcium: 10.4 mg/dL — ABNORMAL HIGH (ref 8.9–10.3)
Chloride: 93 mmol/L — ABNORMAL LOW (ref 98–111)
Creatinine, Ser: 1.9 mg/dL — ABNORMAL HIGH (ref 0.61–1.24)
GFR, Estimated: 38 mL/min — ABNORMAL LOW (ref >60)
Glucose, Bld: 83 mg/dL (ref 70–99)
Potassium: 4.4 mmol/L (ref 3.5–5.1)
Sodium: 131 mmol/L — ABNORMAL LOW (ref 135–145)

## 2023-01-19 NOTE — Progress Notes (Signed)
Zio patch placed onto patient.  All instructions and information reviewed with patient, they verbalize understanding with no questions. 

## 2023-01-19 NOTE — Therapy (Deleted)
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   HLD (hyperlipidemia)    Hx of adenomatous polyp of colon 12/03/2020   Hypertension    OSA on CPAP    Sleep apnea    Past Surgical History:  Procedure Laterality Date   ABLATION     AORTIC VALVE REPLACEMENT  2011   CERVICAL DISC SURGERY     spinal stenosis   COLONOSCOPY     ESOPHAGOGASTRODUODENOSCOPY     RIGHT/LEFT HEART CATH AND CORONARY/GRAFT ANGIOGRAPHY N/A 03/18/2021   Procedure: RIGHT/LEFT HEART CATH AND CORONARY/GRAFT ANGIOGRAPHY;  Surgeon: Jettie Booze, MD;  Location: [facility];  Service: Cardiovascular;  Laterality: N/A;   TONSILLECTOMY     Patient Active Problem List   Diagnosis Date Noted   Heredofamilial amyloidosis (HCC)-TTR, 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   Acute respiratory failure with hypoxia (HCC)    Precordial chest pain    SOB (shortness of breath)    Acute CHF (congestive heart failure) ([place]) 12/17/2021   Acute CHF ([place]) 12/17/2021   Abnormal findings on diagnostic imaging of liver and biliary tract 05/25/2021   Benign prostatic hyperplasia with lower urinary tract symptoms [id number]   Diastolic dysfunction [id number]   Ischemic cardiomyopathy 05/25/2021   Long term (current) use of anticoagulants 05/25/2021   Unintentional weight loss 05/25/2021   Decompensated heart failure ([place]) [id number]   Systolic CHF, chronic (HCC)    Hemoptysis 03/11/2021   Near syncope 02/12/2021   CKD (chronic  kidney disease), 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   Chronic diastolic CHF (congestive heart failure) ([place]) 02/12/2021   Anticoagulated by anticoagulation treatment 01/05/2021   Bilateral impacted cerumen 01/05/2021   Hx of adenomatous polyp of colon 12/03/2020   Coronary artery disease due to lipid rich plaque 12/27/2018   Atrial fibrillation ([place]) 12/27/2018   Obesity 09/10/2018   Right-sided Bell's palsy 07/30/2018   Raynaud's disease 11/06/2017   Male erectile disorder 09/13/2017   CAD (coronary artery disease) 06/20/2017   Hyperlipidemia 06/20/2017   PAF (paroxysmal atrial fibrillation) ([place]) 06/20/2017   Gout 05/22/2017   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[place]) [id number]   Acute systolic CHF (congestive heart failure) ([place]) 10/10/2016   Elevated troponin 10/09/2016   Benign essential hypertension 09/05/2016   Healthcare maintenance 09/05/2016   Need for immunization against influenza 09/05/2016   Pure hypercholesterolemia 09/05/2016   Typical atrial flutter ([place]) 07/19/2016   S/P cryoablation of arrhythmia 06/02/2016   OSA on CPAP 06/02/2016   Type 2 diabetes mellitus ([place]) 06/02/2016   Bilateral edema of lower extremity 05/02/2016   Presence of prosthetic heart valve 05/02/2016   DOE (dyspnea on exertion) 05/02/2016    ONSET DATE: 12/28/22 - referral date  REFERRING DIAG: R49.0 (ICD-10-CM) - Hoarseness R49.0 (ICD-10-CM) - Dysphonia  THERAPY DIAG:  No diagnosis found.  Rationale for Evaluation and Treatment: Rehabilitation  SUBJECTIVE:   SUBJECTIVE STATEMENT: ***  PERTINENT HISTORY: Frank Berg saw ENT 11/10/22 and was placed on reflux meds with no improvement in voice. 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 Frank Berg is weaning off of reflux meds at this time  PAIN:  Are you having pain? No  FALLS: Has patient fallen in last 6 months?  No  LIVING ENVIRONMENT: Lives with: lives with their spouse Lives in: House/apartment  PLOF:  Level of  assistance: Independent with ADLs, Independent with IADLs Employment: Retired  PATIENT GOALS: "To improve my voice to sing again"  OBJECTIVE:   DIAGNOSTIC FINDINGS: Most recent ENT consult Frank Berg returns today for follow-up of hoarseness. He reports that there has not been dramatic improvement since his last visit. He has been taking the Prevacid regularly and trying to watch his diet. He still notes that his voice tends to weaken as the day progresses. He states that some days are just better than others.Flexible laryngoscopy shows patent anterior nasal cavity with minimal crusting, no discharge or infection. Scar band on right side of nasopharynx Normal base of tongue and supraglottis. Omega shaped epiglottis Normal vocal cord mobility without vocal cord nodule, mass, polyp or tumor. Some slight bowing and thinning of true vocal folds. Left arytenoid shows mucous retention cyst as before hypopharynx normal without mass, pooling of secretions or aspiration. Dr. Redmond Baseman was consulted and observed this endoscopy Patient tolerated procedure without complication or difficulty.  Frank Eng. Spainhour, PA-C  Impression & Plans:   1) hoarseness 2) presbylaryngus   PATIENT REPORTED OUTCOME MEASURES (PROM): VHI: 67 - severe. Frank Berg  rated a 4 or "always" for people ask me to repeat myself, the sound of voice varies throughout the day, voice is worse in the evening. He rated a 3, or "almost always" having difficulty being heard, being understood in noisy room, being heard in another room, feeling strain to produce voice, using much effort to speak, inconsistent clarity  TODAY'S TREATMENT:  01/19/23:  01/17/23: Pt reporting increasing self-aawarenss of voice and reports ability to self-correct. Ongoing education on nature of dysphonia and need for practice to A in generalization of targeted strategies. SLP led pt through voice exercises per prior sessions.  SOVTE: up to song level, with occasional  min-A for use of lower abdominal breathing and increasing frequency of breathing during song task.  Voca function exercises: usual model required. min-A for accuracy of glides, model required for sustained phonation at low, mid, high.  PHoRTE: increased volume and clarity noted at phrase level, able to complete with mod-I, demonstrating self-correction when initial attempt presents with hoarseness Target carryover during moderately complex discourse level task, with pt demonstrating improved clarity and vocal intensity in ~70% of utterances with usual-A.   01/12/23: Ongoing education for vocal warm ups, vocal function exercises, and conversation training techniqyes. SLP reviewed SOVT an vocal exercises with pt. Pt required occasional model, but was highly stimulable and demonstrated consistent accuracy following provided model. Pt with good volume and vocal clarity compared to baseline during phrase level speech while reading, required rare min-A for consistency. Given, occasional verbal cue during structured conversational task, pt maintained good vocal quality, although voice returned to baseline between tasks.   01/10/23: With mod-I, pt recalled previously reviewed voice techniques. Pt endorsed that he had been practicing in the car. SLP introduced SOVT exercises and educated pt on benefits, provided model on how to complete exercises. Pt then demonstrated understanding by completing SOVT exercises with and without voicing.    "Ah"- 75 dB  Sentence reading- 77 - 80 dB  Provided faded model, pt demonstrated back PhoRTE vocal exercises with appropriate volume. Given a category, pt named  three items belonging to each while practicing maintaining target vocal quality (reduced hoarseness, increased volume). Provided occasional verbal cue, pt achieved target vocal quality and was able to self evaluate when he used appropriate quality.  HEP: 5-10 times each SOVT exercise as warm up  01/02/23 (eval day): As  Long was stimulable for clear voice and normal volume with cues for breath support and volume and his dx with bowing and thinning of vocal folds, I initiated PhoRTE with usual min to mod A for volume, pitch glides and high/low pitch sentences. Achille achieved clear phonation and WNL volume, Ongoing verbal cues to ID change in voice quality when he speaks with intent. He denies feeling strain or vocal fatigue after HEP training.   PATIENT EDUCATION: Education details: see today's treatment and patient instructions, HEP for voice Person educated: Patient Education method: Explanation, Demonstration, Verbal cues, and Handouts Education comprehension: returned demonstration, verbal cues required, and needs further education  HOME EXERCISE PROGRAM: PhoRTE   GOALS: Goals reviewed with patient? Yes  SHORT TERM GOALS: Target date: 01/30/23  Pt will complete HEP for voice with rare min A over 2 sessions Baseline: Goal status: MET  2.  Pt will achieve 80dB on loud Ah and 72dB on 18/20 sentences with rare min A Baseline:  Goal status: IN PROGRESS  3.  Pt will average 70dB with clear phonation over 5 minutes of simple conversation with rare min A Baseline:  Goal status: IN PROGRESS  4.  Pt will verbalize reflux precautions with rare min A Baseline:  Goal status: IN PROGRESS  5.  Pt will be audible in mildly noise environment with clear phonation over 5 minute conversation with occasional min A Baseline:  Goal status: IN PROGRESS  6. Pt will eliminate throat clears and demonstrate throat clear alternatives with rare min A           Goal Status: IN PROGRESS   LONG TERM GOALS: Target date: 02/27/23  Pt will complete HEP for voice with mod I Baseline:  Goal status: MET  2.  Pt will average 70dB with clear phonation over 15 minute conversation with rare min A Baseline:  Goal status: IN PROGRESS  3.  Pt will be intelligible in noisy environment over 10 minute conversation or while  walking out side with rare min A Baseline:  Goal status: IN PROGRESS  4.  Pt will report 50% less requests for repetition (subjectively) over 1 week Baseline:  Goal status: IN PROGRESS  5.  Pt will improve score on Voice Handicap Index by 8 points Baseline:  Goal status: IN PROGRESS ASSESSMENT:  CLINICAL IMPRESSION: Patient is a 67 y.o. male who was seen today for moderate voice disorder. SOVT and vocal exercises reviewed today. When volume targeted, Conor demonstrated improved phonation and intelligibility. I continue to recommend skilled ST to maximize intelligibility for QOL, safety and life participation.   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 These impairments are limiting patient from effectively communicating at home and in community.Factors affecting potential to achieve goals and functional outcome are  N/A .Marland Kitchen Patient will benefit from skilled SLP services to address above impairments and improve overall function.  REHAB POTENTIAL: Good  PLAN:  SLP FREQUENCY: 2x/week  SLP DURATION: 8 weeks  PLANNED INTERVENTIONS: Environmental controls, Cueing hierachy, Internal/external aids, Multimodal communication approach, SLP instruction and feedback, and Compensatory strategies, possible EMST   Leroy Libman, Student-SLP 01/19/2023, 8:15 AM

## 2023-01-19 NOTE — Progress Notes (Signed)
ADVANCED HF CLINIC NOTE  Primary Care: [name], MD Primary Cardiologist: Larae Grooms, MD HF Cardiologist: Dr. Haroldine Laws [facility]: Dr Radene Knee  HPI: Mr. Vodicka is a 67 y.o. male referred by Dr. Irish Lack for further evaluation of his HF.   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 Also has h/o AF s/p AF ablation at [facility] in 2017. CAD s/p previous stent (2008) followed by LIMA to LAD at time of AVR. TTR Amyloid/   Admitted in 4/22 with near syncope. EF 45% by echo. RV mildly HK. RVSP 70mmHG   R/L cath 5/22: mLAD 90% (patent LIMA to LAD), LCX and RCA minimal CAD.  RA 9 RV 24/0 PA 25/8 (15) PCWP 5 Fick 3.4/1.6  We saw him for the first time on 08/23/21 for AHF consult. W/u revealed severe TTR cardiac amyloidosis. Started on tafamadis. CPX with marked HF limitation.  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 Diuresed 14 pounds. D/c weight 171 on Monday. Started on torsemide 40 but hasn't taken it yet.  Echo 12/17/21: EF 45-50% severe LVH RV mod HK.  Follow up 3/23, volume increasing, torsemide 40 mg daily started.   Transferred his care to [facility]. Started on Amvuttra in 4/23 by Neurology  Then moved to CT and was seen at [facility]. CPX test in 6/23 showed pVO2 9.8 but apparently didn't reach AT so felt to be submax test. No RER or VE/VCO2 slope reported.   Today he returns for HF follow up. Recently seen by Dr. Carlean Purl (GI). RUQ u/s suggesting cirrhosis. Says he gets around slowly. "I'm Managing". Continues on tafamadis and Amvuttra. Breathing is ok. Takes extra torsemide as needed for swelling. No orthopnea or PND. BP ok on midodrine. Neuropathy stable. (Sees Neuro at [facility] on 5/5)   Cardiac studies:  - CPX test in 6/23 [facility]) showed pVO2 9.8 but apparently didn't reach AT so felt to be submax test. No RER or VE/VCO2 slope reported.  - Echo (2/23): EF 45-50%, severe LVH, RV moderate HK  - CPX  09/07/21  FVC 3.06  (74%)      FEV1 2.30 (73%)        FEV1/FVC 75 (98%)        MVV 82 (58%)       BP rest: 94/64 Standing BP: 86/64 BP peak: 100/58  Peak VO2: 10.4 (36% predicted peak VO2)  VE/VCO2 slope:  48  OUES: 1.04  Peak RER: 1.09  Ventilatory Threshold: 9.3 (33% predicted or measured peak VO2)  O2pulse:  8   (46% predicted O2pulse  - cMRI 09/22/21 1. Mild LVE with moderate LVH diffuse hypokinesis EF 40% 2. Markedly abnormal post gadolinium images with 41.8% myocardial uptake over all axial slices Although there is a subendocardial and mid myocardial predominance and worse uptake in the basal segments there is also transmural uptake in some areas not contained to a coronary distribution 3. Degenerative appearing Bioprosthetic AVR with mild appearing AR and thickened leaflets 4.  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     Ensure Max Protein (ENSURE MAX PROTEIN) LIQD Take 330 mLs (11 oz total) by mouth at bedtime. 3330 mL 0   glucose blood (ONETOUCH VERIO) test strip TEST ONCE A DAY DX E11.29     JARDIANCE 10 MG TABS tablet TAKE ONE TABLET BY MOUTH DAILY BEFORE BREAKFAST 90 tablet 0   lansoprazole (PREVACID) 15 MG capsule Take 30 mg by mouth daily as needed.     loratadine (CLARITIN) 10 MG tablet Take 10 mg by mouth daily as needed for allergies.     magnesium  oxide (MAG-OX) 400 MG tablet Take 400 mg by mouth daily.     metFORMIN (GLUCOPHAGE) 500 MG tablet Take 1 tablet (500 mg total) by mouth 2 (two) times daily with a meal.     midodrine (PROAMATINE) 5 MG tablet Take 5 mg by mouth 2 (two) times daily with a meal.     Multiple Vitamins-Minerals (CERTAVITE/ANTIOXIDANTS) TABS Take 1 tablet by mouth daily. 30 tablet 0   Rivaroxaban (XARELTO) 15 MG TABS tablet Take 15 mg by mouth daily with supper.     rosuvastatin (CRESTOR) 5 MG tablet Take 5 mg by mouth every evening.     spironolactone (ALDACTONE) 25 MG tablet TAKE 1/2 OF A TABLET BY MOUTH DAILY 45 tablet 3   tadalafil, PAH, (ADCIRCA) 20 MG tablet Take 20 mg by mouth daily as needed (ED).     Tafamidis (VYNDAMAX) 61 MG CAPS Take 1 capsule by mouth daily. 90 capsule 3   torsemide (DEMADEX) 20 MG tablet Take 20 mg by mouth daily.     vutrisiran sodium (AMVUTTRA) 25 MG/0.5ML syringe Inject 25 mg into the skin every 3 (three) months. Last dose was October 2023     No current facility-administered medications for this encounter.   Allergies  Allergen Reactions   Lipitor [Atorvastatin] Other (See Comments)    Body aches   Penicillins Swelling    Swollen face   Penicillin G     Other reaction(s): Unknown   Social History   Socioeconomic History   Marital status: Single    Spouse name: Not on file   Number of children: 2   Years of education: Not on file   Highest education level: Not on file  Occupational History   Occupation: retired  Tobacco Use   Smoking status: Former    Packs/day: 0.25    Years: 10.00    Additional pack years: 0.00    Total pack years: 2.50    Types: Cigarettes    Quit date: 11/03/1981    Years since quitting: 41.2   Smokeless tobacco: Never  Vaping Use   Vaping Use: Never used  Substance and Sexual Activity   Alcohol use: Yes    Comment: occasional   Drug use: No   Sexual activity: Not on file  Other Topics Concern   Not on file  Social History Narrative    Retired Engineer, 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   Married 1 son 1 daughter   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   Other jobs including working at the [employer],  providing health care for disabled, 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, 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   Social Determinants of Radio broadcast assistant Strain: Not on file  Food Insecurity: Not on file  Transportation Needs: Not on file  Physical Activity: Not on file  Stress: Not on file  Social Connections: Not on file  Intimate Partner Violence: Not on file   Family History  Problem Relation Age of Onset   Congenital heart disease Mother    Diabetes Mother    Hypertension Mother    Heart disease Mother    Heart attack Father    Cervical cancer Sister    Diabetes Sister    Hypertension Brother    Diabetes Brother    Pancreatic cancer Brother    Diabetes Brother    Breast cancer Sister    Diabetes Sister    Diabetes Sister    Diabetes Sister    Diabetes Sister    Diabetes Son    Colon cancer Neg Hx    Colon polyps Neg Hx    Esophageal cancer Neg Hx    Stomach cancer Neg Hx    BP 100/74   Pulse 63   Wt 73.4 kg (161 lb 12.8 oz)   SpO2 100%   BMI 20.77 kg/m    Wt Readings from Last 3 Encounters:  01/19/23 73.4 kg (161 lb 12.8 oz)  01/03/23 73.2 kg (161 lb 6.4 oz)  10/20/22 75.7 kg (166 lb 12.8 oz)   PHYSICAL EXAM: General:  Thin frail No resp difficulty HEENT: normal Neck: supple. no JVD. Carotids 2+ bilat; no bruits. No lymphadenopathy or thryomegaly appreciated. Cor: PMI nondisplaced. Regular rate & rhythm. No rubs, gallops or murmurs. Lungs: clear Abdomen: soft, nontender, nondistended. No hepatosplenomegaly. No bruits or masses. Good bowel sounds. Extremities: no cyanosis, clubbing, rash, edema Neuro: alert & orientedx3, cranial  nerves grossly intact. moves all 4 extremities w/o difficulty. Affect pleasant  ECG: Junctional rhythm RBBB 69 Personally reviewed   ASSESSMENT & PLAN:   1. 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 RVSP 33mmHG with question of mild D-shaped septum - R/L cath 5/22: mLAD 90% (patent LIMA to LAD), LCX and RCA minimal CAD.  RA 9 RV 24/0 PA 25/8 (15) PCWP 5 Fick 3.4/1.6CPX 09/07/21 - CPX 11/22: pVO2 10.4 VE/VCO2 slope:  48, pRER: 1.09  - cMRI 09/22/21: EF 40% RVEF 46%  diffuses LGE and markedly elevated ECW ~71%. degenerative appearing Bioprosthetic AVR with mild AI. Reviewed with imaging team and felt to be c/w advanced cardiac amyloidosis. - PYP 1/22 markedly positive. 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 Diuresed 14 pounds - CPX confirms severe HF limitation - Echo 5/23 EF 38% at [facility] stable Volume status ok. Continue torsemide 20 mg daily.  - Off bisoprolol. - Continue Jardiance 10 mg daily. - Continue spiro 12.5 mg daily.  - Continue midodrine 5 mg tid + compression hose for orthostasis.  - Not candidate for advanced therapies with cachexia and CKD  2. 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 Myeloma panel negative - Genetic testing + for Val122ILe mutation  - Continue tafamadis + amvutrra - Has Neuro f/u at [facility] for polyneuropathy  3. CAD - S/p  previous stent 2008 (unknown vessel) & LIMA to LAD 2011 - No s/s angina - Continue statin. No ASA with Xarelto  4.  Bicuspid AoV s/p bioprosthetic AVR 2011 - Stable on  echo. - Aware of need for SBE prophylaxis  5. PAF - s/p AF ablation 2017 - No recurrence.  - Continue Xarelto. No bleeding.  6. OSA - Compliant with CPAP.  - No change.  7. DM2 - Continue Jardiance.  8. CKD3a - Baseline SCr 1.4-1.6 - Continue SGLT2i - Labs today  9. Orthostatic  hypotension - Improved on midodrine.  10. Junctional rhythm - at high risk for progressive conduction disease with TTR amyloid. place Zio to further evaluate   Glori Bickers, MD  11:51 AM -

## 2023-01-19 NOTE — Patient Instructions (Signed)
Good to see you today!  No medication changes  Your physician has requested that you have an echocardiogram. Echocardiography is a painless test that uses sound waves to create images of your heart. It provides your doctor with information about the size and shape of your heart and how well your heart's chambers and valves are working. This procedure takes approximately one hour. There are no restrictions for this procedure. Please do NOT wear cologne, perfume, aftershave, or lotions (deodorant is allowed). Please arrive 15 minutes prior to your appointment time.   Labs done today, your results will be available in MyChart, we will contact you for abnormal readings.  Your provider has recommended that  you wear a Zio Patch for 7 days.  This monitor will record your heart rhythm for our review.  IF you have any symptoms while wearing the monitor please press the button.  If you have any issues with the patch or you notice a red or orange light on it please call the company at [phone].  Once you remove the patch please mail it back to the company as soon as possible so we can get the results.  Your physician recommends that you schedule a follow-up appointment in: 6 months with echocardiogram(September) Call office in July  to schedule an appointment   If you have any questions or concerns before your next appointment please send [place] a message through [place] or call our office at [phone].    TO LEAVE A MESSAGE FOR THE NURSE SELECT OPTION 2, PLEASE LEAVE A MESSAGE INCLUDING: YOUR NAME DATE OF BIRTH CALL BACK NUMBER REASON FOR CALL**this is important as we prioritize the call backs  YOU WILL RECEIVE A CALL BACK THE SAME DAY AS LONG AS YOU CALL BEFORE 4:00 PM  At the [facility], you and your health needs are our priority. As part of our continuing mission to provide you with exceptional heart care, we have created designated Provider Care Teams. These Care Teams  include your primary Cardiologist (physician) and Advanced Practice Providers (APPs- Physician Assistants and Nurse Practitioners) who all work together to provide you with the care you need, when you need it.   You may see any of the following providers on your designated Care Team at your next follow up: Dr Glori Bickers Dr Loralie Champagne Dr. Roxana Hires, NP Lyda Jester, [place] [facility] [place], [place] Forestine Na, NP Audry Riles, PharmD   Please be sure to bring in all your medications bottles to every appointment.    Thank you for choosing [facility]

## 2023-01-21 ENCOUNTER — Other Ambulatory Visit: Payer: Self-pay | Admitting: Internal Medicine

## 2023-01-21 ENCOUNTER — Telehealth: Payer: Self-pay | Admitting: Home Health

## 2023-01-21 ENCOUNTER — Ambulatory Visit (HOSPITAL_COMMUNITY)
Admission: RE | Admit: 2023-01-21 | Discharge: 2023-01-21 | Disposition: A | Discharge status: Home / Self Care | Payer: Medicare HMO | Source: Ambulatory Visit | Attending: Internal Medicine | Admitting: Internal Medicine

## 2023-01-21 DIAGNOSIS — R932 Abnormal findings on diagnostic imaging of liver and biliary tract: Secondary | ICD-10-CM

## 2023-01-21 DIAGNOSIS — K838 Other specified diseases of biliary tract: Secondary | ICD-10-CM | POA: Diagnosis present

## 2023-01-21 NOTE — Telephone Encounter (Signed)
Patient called after hour line, reports he is here getting his MRI of abdomen requested by GI. He was told he can't wear the Zio monitor during MRI. He is feeling well without any dizziness or syncope. Advised the patient to remove Zio monitor during MRI, re-apply after MRI completion.

## 2023-01-24 ENCOUNTER — Ambulatory Visit: Payer: Medicare HMO | Admitting: Speech Pathology

## 2023-01-25 ENCOUNTER — Telehealth: Payer: Self-pay | Admitting: Internal Medicine

## 2023-01-25 NOTE — Telephone Encounter (Signed)
Patient called regarding radiology appt at [facility] for tomorrow. Would like to know how to prep. Please advise

## 2023-01-25 NOTE — Telephone Encounter (Signed)
Patient called.

## 2023-01-25 NOTE — Telephone Encounter (Signed)
Radiology called in regard to previous message  and was notified that pt needs to fast 3 hours prior and arrive 30 minutes early. Pt made aware.  Pt verbalized understanding with all questions answered.

## 2023-01-26 ENCOUNTER — Ambulatory Visit (HOSPITAL_COMMUNITY)
Admission: RE | Admit: 2023-01-26 | Discharge: 2023-01-26 | Disposition: A | Discharge status: Home / Self Care | Payer: Medicare HMO | Source: Ambulatory Visit | Attending: Internal Medicine | Admitting: Internal Medicine

## 2023-01-26 DIAGNOSIS — R1310 Dysphagia, unspecified: Secondary | ICD-10-CM | POA: Diagnosis present

## 2023-01-30 ENCOUNTER — Other Ambulatory Visit: Payer: Self-pay

## 2023-01-30 DIAGNOSIS — K219 Gastro-esophageal reflux disease without esophagitis: Secondary | ICD-10-CM

## 2023-01-30 MED ORDER — LANSOPRAZOLE 30 MG PO CPDR: 30.0000 mg | DELAYED_RELEASE_CAPSULE | Freq: Every day | ORAL | 3 refills | Status: DC

## 2023-01-31 ENCOUNTER — Encounter: Payer: Medicare HMO | Admitting: Speech Pathology

## 2023-02-01 ENCOUNTER — Encounter: Payer: Self-pay | Admitting: Internal Medicine

## 2023-02-01 ENCOUNTER — Ambulatory Visit: Payer: Medicare HMO | Attending: Physician Assistant | Admitting: Speech Pathology

## 2023-02-01 ENCOUNTER — Telehealth: Payer: Self-pay | Admitting: Internal Medicine

## 2023-02-01 DIAGNOSIS — R498 Other voice and resonance disorders: Secondary | ICD-10-CM | POA: Insufficient documentation

## 2023-02-01 NOTE — Telephone Encounter (Signed)
Pt made aware that he can reach out to his PCP. Pt verbalized understanding with all questions answered.

## 2023-02-01 NOTE — Telephone Encounter (Signed)
Spoke to pt.  Documented in phone note.  

## 2023-02-01 NOTE — Telephone Encounter (Signed)
Patient called and stated that he has had no appetite for the past couple of week. He is scheduled for a FU on 05/07. Would like to speak about what he could do before then because he is really worried about having no appetite. Please advise.

## 2023-02-01 NOTE — Telephone Encounter (Signed)
No other recommendations Not a GI problem  Can ask PCP

## 2023-02-01 NOTE — Telephone Encounter (Signed)
Left message for pt to call back  °

## 2023-02-01 NOTE — Telephone Encounter (Signed)
Pt stated that for a couple of weeks now that he has lost his taste which has contributed to a loss of weight and generalized weakness from not really eating a lot. Pt has no other symptoms.  Pt previously scheduled for on office visit to see Dr. Leone Payor on 02-28-2023 at 10:30 AM. Pt aware  Pt was recommended to be tested for Covid. Please advise if any other recommendations:

## 2023-02-01 NOTE — Therapy (Unsigned)
OUTPATIENT SPEECH LANGUAGE PATHOLOGY TREATMENT   Patient Name: Frank Berg MRN: [id number] DOB:1956-09-19, 68 y.o., male Today's Date: 02/01/2023  PCP: Frank Prince MD REFERRING PROVIDER: Vivianne Master, PA  END OF SESSION:  End of Session - 02/01/23 1404     Visit Number 5    Number of Visits 17    Date for SLP Re-Evaluation 02/27/23    Authorization Type [employer]    SLP Start Time 1401    SLP Stop Time  1445    SLP Time Calculation (min) 44 min    Activity Tolerance Patient tolerated treatment well             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   HLD (hyperlipidemia)    Hx of adenomatous polyp of colon 12/03/2020   Hypertension    OSA on CPAP    Sleep apnea    Past Surgical History:  Procedure Laterality Date   ABLATION     AORTIC VALVE REPLACEMENT  2011   CERVICAL DISC SURGERY     spinal stenosis   COLONOSCOPY     ESOPHAGOGASTRODUODENOSCOPY     RIGHT/LEFT HEART CATH AND CORONARY/GRAFT ANGIOGRAPHY N/A 03/18/2021   Procedure: RIGHT/LEFT HEART CATH AND CORONARY/GRAFT ANGIOGRAPHY;  Surgeon: Corky Crafts, MD;  Location: [facility] INVASIVE [facility];  Service: Cardiovascular;  Laterality: N/A;   TONSILLECTOMY     Patient Active Problem List   Diagnosis Date Noted   Heredofamilial amyloidosis (HCC)-TTR, 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   Acute respiratory failure with hypoxia    Precordial chest pain    SOB (shortness of breath)    Acute CHF (congestive heart failure) 12/17/2021   Acute CHF 12/17/2021   Abnormal findings on diagnostic imaging of liver and biliary tract 05/25/2021   Benign prostatic hyperplasia with lower urinary tract symptoms 05/25/2021   Diastolic dysfunction  05/25/2021   Ischemic cardiomyopathy 05/25/2021   Long term (current) use of anticoagulants 05/25/2021   Unintentional weight loss 05/25/2021   Decompensated heart failure 04/25/2021   Systolic CHF, chronic    Hemoptysis 03/11/2021   Near syncope 02/12/2021   CKD (chronic kidney disease), 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   Chronic diastolic CHF (congestive heart failure) 02/12/2021   Anticoagulated by anticoagulation treatment 01/05/2021   Bilateral impacted cerumen 01/05/2021   Hx of adenomatous polyp of colon 12/03/2020   Coronary artery disease due to lipid rich plaque 12/27/2018   Atrial fibrillation 12/27/2018   Obesity 09/10/2018   Right-sided Bell's palsy 07/30/2018   Raynaud's disease 11/06/2017   Male erectile disorder 09/13/2017   CAD (coronary artery disease) 06/20/2017   Hyperlipidemia 06/20/2017   PAF (paroxysmal atrial fibrillation) 06/20/2017   Gout 05/22/2017   Polyneuropathy due to type 2 diabetes mellitus 05/22/2017   Acute systolic CHF (congestive heart failure) 10/10/2016   Elevated troponin 10/09/2016   Benign essential hypertension 09/05/2016   Healthcare maintenance 09/05/2016   Need for immunization against influenza 09/05/2016   Pure hypercholesterolemia 09/05/2016   Typical atrial flutter 07/19/2016   S/P cryoablation of arrhythmia 06/02/2016   OSA on CPAP 06/02/2016   Type 2 diabetes mellitus 06/02/2016   Bilateral edema of lower extremity 05/02/2016   Presence of prosthetic heart valve 05/02/2016   DOE (  dyspnea on exertion) 05/02/2016    ONSET DATE: 12/28/22 - referral date  REFERRING DIAG: R49.0 (ICD-10-CM) - Hoarseness R49.0 (ICD-10-CM) - Dysphonia  THERAPY DIAG:  Other voice and resonance disorders  Rationale for Evaluation and Treatment: Rehabilitation  SUBJECTIVE:   SUBJECTIVE STATEMENT: "I'm okay. I need to practice more often. I've been practicing in the car and also twice a day for five minutes at a time."  PERTINENT HISTORY: Frank Berg saw ENT  11/10/22 and was placed on reflux meds with no improvement in voice. 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 Frank Berg is weaning off of reflux meds at this time  PAIN:  Are you having pain? No  FALLS: Has patient fallen in last 6 months?  No  LIVING ENVIRONMENT: Lives with: lives with their spouse Lives in: House/apartment  PLOF:  Level of assistance: Independent with ADLs, Independent with IADLs Employment: Retired  PATIENT GOALS: "To improve my voice to sing again"  OBJECTIVE:   DIAGNOSTIC FINDINGS: Most recent ENT consult Frank Berg returns today for follow-up of hoarseness. He reports that there has not been dramatic improvement since his last visit. He has been taking the Prevacid regularly and trying to watch his diet. He still notes that his voice tends to weaken as the day progresses. He states that some days are just better than others.Flexible laryngoscopy shows patent anterior nasal cavity with minimal crusting, no discharge or infection. Scar band on right side of nasopharynx Normal base of tongue and supraglottis. Omega shaped epiglottis Normal vocal cord mobility without vocal cord nodule, mass, polyp or tumor. Some slight bowing and thinning of true vocal folds. Left arytenoid shows mucous retention cyst as before hypopharynx normal without mass, pooling of secretions or aspiration. Dr. Jenne Berg was consulted and observed this endoscopy Patient tolerated procedure without complication or difficulty.  Frank Baloavid S. Spainhour, PA-C  Impression & Plans:   1) hoarseness 2) presbylaryngus   PATIENT REPORTED OUTCOME MEASURES (PROM): VHI: 67 - severe. Frank Berg  Berg a 4 or "always" for people ask me to repeat myself, the sound of voice varies throughout the day, voice is worse in the evening. He Berg a 3, or "almost always" having difficulty being heard, being understood in noisy room, being heard in another room, feeling strain to produce voice, using much effort to  speak, inconsistent clarity  TODAY'S TREATMENT:   02/01/23: Pt endorsed that he has been practicing. Stated he has episode sin the evening when his throat becomes irritated and he has a "coughing bout". With mod-I, pt able to demonstrate and teach back his vocal warm up exercises. Identified moments with poor vocal quality and self-corrected.   01/17/23: Pt reporting increasing self-aawarenss of voice and reports ability to self-correct. Ongoing education on nature of dysphonia and need for practice to A in generalization of targeted strategies. SLP led pt through voice exercises per prior sessions.  SOVTE: up to song level, with occasional min-A for use of lower abdominal breathing and increasing frequency of breathing during song task.  Vocal function exercises: usual model required. min-A for accuracy of glides, model required for sustained phonation at low, mid, high.  PHoRTE: increased volume and clarity noted at phrase level, able to complete with mod-I, demonstrating self-correction when initial attempt presents with hoarseness Target carryover during moderately complex discourse level task, with pt demonstrating improved clarity and vocal intensity in ~70% of utterances with usual-A.   01/12/23: Ongoing education for vocal warm ups, vocal function exercises, and conversation training techniqyes. SLP reviewed SOVT an  vocal exercises with pt. Pt required occasional model, but was highly stimulable and demonstrated consistent accuracy following provided model. Pt with good volume and vocal clarity compared to baseline during phrase level speech while reading, required rare min-A for consistency. Given, occasional verbal cue during structured conversational task, pt maintained good vocal quality, although voice returned to baseline between tasks.   01/10/23: With mod-I, pt recalled previously reviewed voice techniques. Pt endorsed that he had been practicing in the car. SLP introduced SOVT exercises  and educated pt on benefits, provided model on how to complete exercises. Pt then demonstrated understanding by completing SOVT exercises with and without voicing.    "Ah"- 75 dB  Sentence reading- 77 - 80 dB  Provided faded model, pt demonstrated back PhoRTE vocal exercises with appropriate volume. Given a category, pt named three items belonging to each while practicing maintaining target vocal quality (reduced hoarseness, increased volume). Provided occasional verbal cue, pt achieved target vocal quality and was able to self evaluate when he used appropriate quality.  HEP: 5-10 times each SOVT exercise as warm up  01/02/23 (eval day): As Dennis was stimulable for clear voice and normal volume with cues for breath support and volume and his dx with bowing and thinning of vocal folds, I initiated PhoRTE with usual min to mod A for volume, pitch glides and high/low pitch sentences. Jr achieved clear phonation and WNL volume, Ongoing verbal cues to ID change in voice quality when he speaks with intent. He denies feeling strain or vocal fatigue after HEP training.   PATIENT EDUCATION: Education details: see today's treatment and patient instructions, HEP for voice Person educated: Patient Education method: Explanation, Demonstration, Verbal cues, and Handouts Education comprehension: returned demonstration, verbal cues required, and needs further education  HOME EXERCISE PROGRAM: PhoRTE   GOALS: Goals reviewed with patient? Yes  SHORT TERM GOALS: Target date: 01/30/23  Pt will complete HEP for voice with rare min A over 2 sessions Baseline: Goal status: MET  2.  Pt will achieve 80dB on loud Ah and 72dB on 18/20 sentences with rare min A Baseline:  Goal status: IN PROGRESS  3.  Pt will average 70dB with clear phonation over 5 minutes of simple conversation with rare min A Baseline:  Goal status: IN PROGRESS  4.  Pt will verbalize reflux precautions with rare min A Baseline:   Goal status: IN PROGRESS  5.  Pt will be audible in mildly noise environment with clear phonation over 5 minute conversation with occasional min A Baseline:  Goal status: IN PROGRESS  6. Pt will eliminate throat clears and demonstrate throat clear alternatives with rare min A           Goal Status: IN PROGRESS   LONG TERM GOALS: Target date: 02/27/23  Pt will complete HEP for voice with mod I Baseline:  Goal status: MET  2.  Pt will average 70dB with clear phonation over 15 minute conversation with rare min A Baseline:  Goal status: IN PROGRESS  3.  Pt will be intelligible in noisy environment over 10 minute conversation or while walking out side with rare min A Baseline:  Goal status: IN PROGRESS  4.  Pt will report 50% less requests for repetition (subjectively) over 1 week Baseline:  Goal status: IN PROGRESS  5.  Pt will improve score on Voice Handicap Index by 8 points Baseline:  Goal status: IN PROGRESS ASSESSMENT:  CLINICAL IMPRESSION: Patient is a 67 y.o. male who was seen today for  moderate voice disorder. SOVT and vocal exercises reviewed today. When volume targeted, Cezar demonstrated improved phonation and intelligibility. I continue to recommend skilled ST to maximize intelligibility for QOL, safety and life participation.   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 These impairments are limiting patient from effectively communicating at home and in community.Factors affecting potential to achieve goals and functional outcome are  N/A .Marland Kitchen Patient will benefit from skilled SLP services to address above impairments and improve overall function.  REHAB POTENTIAL: Good  PLAN:  SLP FREQUENCY: 2x/week  SLP DURATION: 8 weeks  PLANNED INTERVENTIONS: Environmental controls, Cueing hierachy, Internal/external aids, Multimodal communication approach, SLP instruction and feedback, and Compensatory strategies, possible  EMST   Cephus Shelling, Student-SLP 02/01/2023, 2:05 PM

## 2023-02-01 NOTE — Patient Instructions (Addendum)
ACID REFLUX PRECAUTIONS ACID REFLUX can be a possible cause of a voice disorder or throat irritation. Acid reflux is a disorder where acid from your stomach is abnormally spilled over onto your voice box after eating, during sleep, or even during singing. Acid reflux causes irritation and inflammation to your vocal folds and should be avoided and treated by changing eating habits, changing lifestyle, and taking medication (if prescribed by your doctor).   CHANGE EATING HABITS   Avoid "trigger" foods. Certain foods and drinks can trigger acid reflux.  1. Caffeine- in coffee, tea, chocolate, sodas  2. Carbonated beverages  3. Mint and menthol  4. Fatty/fried foods  5. Citrus fruits  6. Tomato products  7. Spicy foods  8. Alcohol    CHANGING LIFESTYLE HABITS   Drink 8 glasses of water per day (64oz)    Stop smoking   Avoid clearing your throat   Allow 3 hours between last big meal and going to bed at night   Keep yourself upright for one hour after you eat   Elevate the head of your bed using 6-inch blocks under the head of the bed or a bed wedge between the box spring and the mattress.   Eat small meals throughout the day rather than 3 big meals   Eat slowly   Wear loose clothing   TAKING MEDICATION   If prescribed one time a day, take 15-30 minutes before breakfast   If prescribed two times a day, take 15-30 minutes before breakfast and 15- 30 minutes before dinner   CHANGING THE WAY YOU USE YOUR VOICE  "Best voice/ Least effort"  

## 2023-02-03 ENCOUNTER — Ambulatory Visit: Payer: Medicare HMO | Admitting: Speech Pathology

## 2023-02-07 ENCOUNTER — Encounter: Payer: Self-pay | Admitting: Speech Pathology

## 2023-02-07 ENCOUNTER — Ambulatory Visit: Payer: Medicare HMO | Admitting: Speech Pathology

## 2023-02-07 DIAGNOSIS — R498 Other voice and resonance disorders: Secondary | ICD-10-CM

## 2023-02-07 NOTE — Therapy (Signed)
[facility] [address] [place], [place], [address] Phone: [phone]   Fax:  [phone]  Patient Details  Name: KOBIE BROTMAN MRN: [id number] Date of Birth: 07-01-1956 Referring Provider:  No ref. provider found  Encounter Date: 02/07/2023  SPEECH THERAPY DISCHARGE SUMMARY  Visits from Start of Care: 5   Current functional level related to goals / functional outcomes: Mr. Remillard has demonstrated improved ability to optimize his vocal intensity and clarity during structured tasks up to conversational level. Reports some generalization outside of structured practice. Reporting ongoing HEP completion which he is able to demonstrate with occasional min to mod-A in therapy sessions.    Remaining deficits: dysphonia   Education / Equipment: Vocal warm ups, voice exercises, HEP, generalization strategies, vocal hygiene   Patient agrees to discharge. Patient goals were partially met. Patient is being discharged due to the patient's request.   Maia Breslow, CCC-SLP 02/07/2023, 8:31 AM  [facility] [address] [place], [place], [address] Phone: [phone]   Fax:  [phone]

## 2023-02-08 ENCOUNTER — Ambulatory Visit: Payer: Medicare HMO | Admitting: Speech Pathology

## 2023-02-13 ENCOUNTER — Other Ambulatory Visit: Payer: Self-pay | Admitting: Internal Medicine

## 2023-02-13 DIAGNOSIS — I5032 Chronic diastolic (congestive) heart failure: Secondary | ICD-10-CM

## 2023-02-14 ENCOUNTER — Other Ambulatory Visit (HOSPITAL_COMMUNITY): Payer: Self-pay

## 2023-02-15 ENCOUNTER — Encounter: Payer: Medicare HMO | Admitting: Speech Pathology

## 2023-02-22 ENCOUNTER — Other Ambulatory Visit (HOSPITAL_COMMUNITY): Payer: Medicare HMO

## 2023-02-23 ENCOUNTER — Ambulatory Visit (HOSPITAL_COMMUNITY)
Admission: RE | Admit: 2023-02-23 | Discharge: 2023-02-23 | Disposition: A | Discharge status: Home / Self Care | Payer: Medicare HMO | Source: Ambulatory Visit | Attending: Cardiology | Admitting: Cardiology

## 2023-02-23 DIAGNOSIS — E852 Heredofamilial amyloidosis, unspecified: Secondary | ICD-10-CM | POA: Diagnosis not present

## 2023-02-23 MED ORDER — VUTRISIRAN SODIUM 25 MG/0.5ML ~~LOC~~ SOSY
25.0000 mg | PREFILLED_SYRINGE | Freq: Once | SUBCUTANEOUS | Status: AC
  Administered 2023-02-23: 25 mg via SUBCUTANEOUS
  Filled 2023-02-23: qty 0.5

## 2023-02-23 NOTE — Patient Instructions (Signed)
It was a pleasure seeing you today!  MEDICATIONS: -No medication changes today -Call if you have questions about your medications.   NEXT APPOINTMENT: Return to clinic in 3 months for repeat Amvuttra injection.  In general, to take care of your heart failure: -Limit your fluid intake to 2 Liters (half-gallon) per day.   -Limit your salt intake to ideally 2-3 grams (2000-3000 mg) per day. -Weigh yourself daily and record, and bring that "weight diary" to your next appointment.  (Weight gain of 2-3 pounds in 1 day typically means fluid weight.) -The medications for your heart are to help your heart and help you live longer.   -Please contact us before stopping any of your heart medications.  Call the clinic at [phone] with questions or to reschedule future appointments.  

## 2023-02-23 NOTE — Progress Notes (Signed)
Advanced Heart Failure Clinic Note   Primary Care: Adrian Prince, MD Primary Cardiologist: Lance Muss, MD HF Cardiologist: Dr. Gala Romney [facility]: Dr Cherly Hensen  HPI:  [name] is a 67 y.o. male referred by Dr. Eldridge Dace for further evaluation of his HF.    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 Also has h/o AF s/p AF ablation at [facility] in 2017. CAD s/p previous stent (2008) followed by LIMA to LAD at time of AVR. TTR Amyloid/    Admitted in 4/22 with near syncope. EF 45% by echo. RV mildly HK. RVSP    R/L cath 5/22: mLAD 90% (patent LIMA to LAD), LCX and RCA minimal CAD.  RA 9 RV 24/0 PA 25/8 (15) PCWP 5 Fick 3.4/1.6   We saw him for the first time on 08/23/21 for AHF consult. W/u revealed severe TTR cardiac amyloidosis. Started on tafamadis. CPX with marked HF limitation.  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 Diuresed 14 pounds. D/c weight 171 lbs. Started on torsemide 40 mg but hadn't taken it yet.   Echo 12/17/21: EF 45-50% severe LVH RV mod HK.   Follow up 12/2021, volume increasing, torsemide 40 mg daily started.    Transferred his care to [facility]. Started on Amvuttra in 01/2022 by Neurology   Then moved to [place] and was seen at [facility]. CPX test in 03/2022 showed pVO2 9.8 but apparently didn't reach AT so felt to be submax test. No RER or VE/VCO2 slope reported. Got last dose of Amvuttra on 08/23/22 at [place]. Now moved back to [place]   Today he returns to HF clinic for Amvuttra administration. Patient doing well today. No contraindications to injection. Has neurology follow up with [facility] on 03/01/23.  -repeat in August> would do week of August 5; maybe later in the week, may be on PAL .  Assessment/Plan: 1. Chronic systolic HF with R>>L heart failure - echo 01/2021 EF 45% +LVH RV mildly HK. RVSP with question of mild D-shaped septum - R/L cath 02/2021: mLAD 90% (patent LIMA to LAD), LCX and RCA  minimal CAD.  RA 9 RV 24/0 PA 25/8 (15) PCWP 5 Fick 3.4/1.6CPX 09/07/21 - CPX 08/2021: pVO2 10.4 VE/VCO2 slope:  48, pRER: 1.09  - cMRI 09/22/21: EF 40% RVEF 46%  diffuses LGE and markedly elevated ECW ~71%. degenerative appearing Bioprosthetic AVR with mild AI. Reviewed with imaging team and felt to be c/w advanced cardiac amyloidosis. - PYP 10/2020 markedly positive. 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 Diuresed 14 pounds - CPX confirms severe HF limitation - Echo 02/2022 EF 38% at [facility]  - NYHA II. Volume status stable. Continue torsemide 20 mg daily.  - Off bisoprolol. - Continue Jardiance 10 mg daily. - Continue spironolactone 12.5 mg daily.  - Continue midodrine 5 mg TID + compression hose for orthostasis.  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 Patient tolerated injection well. Provided patient counseling on Amvuttra. Most common side effects are injection site reactions, arthralgias and dyspnea. Patient is aware to return to clinic every 3  months for repeat injection.  - Continue vitamin A supplement 8000 IU daily. Amvuttra decreases serum vitamin A levels.   Follow up 3 months for repeat injection.   Karle Plumber, PharmD, BCPS, BCCP, CPP Heart Failure Clinic Pharmacist [phone]

## 2023-02-28 ENCOUNTER — Ambulatory Visit (INDEPENDENT_AMBULATORY_CARE_PROVIDER_SITE_OTHER): Payer: Medicare HMO | Admitting: Internal Medicine

## 2023-02-28 ENCOUNTER — Other Ambulatory Visit (HOSPITAL_COMMUNITY): Payer: Self-pay

## 2023-02-28 ENCOUNTER — Encounter: Payer: Self-pay | Admitting: Internal Medicine

## 2023-02-28 ENCOUNTER — Other Ambulatory Visit (INDEPENDENT_AMBULATORY_CARE_PROVIDER_SITE_OTHER): Payer: Medicare HMO

## 2023-02-28 VITALS — BP 114/66 | HR 82 | Ht 74.0 in | Wt 158.6 lb

## 2023-02-28 DIAGNOSIS — K5909 Other constipation: Secondary | ICD-10-CM

## 2023-02-28 DIAGNOSIS — R432 Parageusia: Secondary | ICD-10-CM

## 2023-02-28 DIAGNOSIS — E852 Heredofamilial amyloidosis, unspecified: Secondary | ICD-10-CM

## 2023-02-28 DIAGNOSIS — K7460 Unspecified cirrhosis of liver: Secondary | ICD-10-CM | POA: Diagnosis not present

## 2023-02-28 DIAGNOSIS — R634 Abnormal weight loss: Secondary | ICD-10-CM

## 2023-02-28 DIAGNOSIS — K219 Gastro-esophageal reflux disease without esophagitis: Secondary | ICD-10-CM

## 2023-02-28 LAB — COMPREHENSIVE METABOLIC PANEL
ALT: 12 U/L (ref 0–53)
AST: 63 U/L — ABNORMAL HIGH (ref 0–37)
Albumin: 4.1 g/dL (ref 3.5–5.2)
Alkaline Phosphatase: 125 U/L — ABNORMAL HIGH (ref 39–117)
BUN: 66 mg/dL — ABNORMAL HIGH (ref 6–23)
CO2: 26 mEq/L (ref 19–32)
Calcium: 10.1 mg/dL (ref 8.4–10.5)
Chloride: 90 mEq/L — ABNORMAL LOW (ref 96–112)
Creatinine, Ser: 2.36 mg/dL — ABNORMAL HIGH (ref 0.40–1.50)
GFR: 27.55 mL/min — ABNORMAL LOW (ref >60.00)
Glucose, Bld: 112 mg/dL — ABNORMAL HIGH (ref 70–99)
Potassium: 4.3 mEq/L (ref 3.5–5.1)
Sodium: 128 mEq/L — ABNORMAL LOW (ref 135–145)
Total Bilirubin: 1.7 mg/dL — ABNORMAL HIGH (ref 0.2–1.2)
Total Protein: 8.6 g/dL — ABNORMAL HIGH (ref 6.0–8.3)

## 2023-02-28 LAB — FERRITIN: Ferritin: 13.8 ng/mL — ABNORMAL LOW (ref 22.0–322.0)

## 2023-02-28 NOTE — Patient Instructions (Signed)
Your provider has requested that you go to the basement level for lab work before leaving today. Press "B" on the elevator. The lab is located at the first door on the left as you exit the elevator. We will be in touch with results and plans.  _______________________________________________________  If your blood pressure at your visit was 140/90 or greater, please contact your primary care physician to follow up on this.  _______________________________________________________  If you are age 69 or older, your body mass index should be between 23-30. Your Body mass index is 20.36 kg/m. If this is out of the aforementioned range listed, please consider follow up with your Primary Care Provider.  If you are age 28 or younger, your body mass index should be between 19-25. Your Body mass index is 20.36 kg/m. If this is out of the aformentioned range listed, please consider follow up with your Primary Care Provider.   ________________________________________________________  The [facility] providers would like to encourage you to use [facility] to communicate with providers for non-urgent requests or questions.  Due to long hold times on the telephone, sending your provider a message by [facility] may be a faster and more efficient way to get a response.  Please allow 48 business hours for a response.  Please remember that this is for non-urgent requests.  _______________________________________________________  I appreciate the opportunity to care for you. Stan Head, MD, [facility]

## 2023-02-28 NOTE — Progress Notes (Addendum)
Frank Berg 67 y.o. 03-04-1956 [id number]  Assessment & Plan:   Encounter Diagnoses  Name Primary?   Cirrhosis of liver without ascites, unspecified hepatic cirrhosis type (HCC)? 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  He has had an elevated globulin fraction in the past.  Perhaps there is some sort of autoimmune process as well.  As I had stated previously I think I do not think his amyloidosis should affect his liver.  Continue other medications.  Question if CHF issues are creating liver abnormalities congestive hepatopathy.  He has an echocardiogram coming up.  He was not aware of that but it is ordered.  I am going to reach out to Dr. Gala Romney for his thoughts as well. - spoke top him - he indicated that this version of amyloidosis can cause liver danahe though he suspects as I do that CHF likely culprit.  Orders Placed This Encounter  Procedures   Comprehensive metabolic panel   ANA   IgG   Anti-smooth muscle antibody, IgG   Alpha-1-antitrypsin   Mitochondrial antibodies   Ferritin     Chemistry      Component Value Date/Time   NA 128 (L) 02/28/2023 1142   NA 139 08/16/2021 1337   K 4.3 02/28/2023 1142   CL 90 (L) 02/28/2023 1142   CO2 26 02/28/2023 1142   BUN 66 (H) 02/28/2023 1142   BUN 31 (H) 08/16/2021 1337   CREATININE 2.36 (H) 02/28/2023 1142      Component Value Date/Time   CALCIUM 10.1 02/28/2023 1142   ALKPHOS 125 (H) 02/28/2023 1142   AST 63 (H) 02/28/2023 1142   ALT 12 02/28/2023 1142   BILITOT 1.7 (H) 02/28/2023 1142   BILITOT 0.5 04/01/2021 7829     Dr. Gala Romney will have patient recheck BMP next week  Lab Results  Component Value Date   FERRITIN 13.8 (L) 02/28/2023   I will contact patient -  he will need at least an EGD to assess iron deficiency  06/04/2023 Patient saw Dr. Gala Romney and was told he is end-stage and referred to palliative care.  I will not pursue EGD/colonoscopy  [name], MD, [facility]     Subjective:   Chief Complaint: Follow-up of testing, review results  HPI 67 year old African-American man with hereditary familial amyloidosis and imaging suggesting cirrhosis of the liver, chronic constipation and reflux problems.  He is recently had a significant workup after seeing me in March.  Abdominal films showed a large amount of stool he was treated with a MiraLAX purge and is using MiraLAX intermittently to treat constipation with some success.  He continues to be short of breath and have altered taste.  Food intake may be off, history is not completely specific.  The patient had been undergoing speech pathology because of voice quality problems but he discontinued that.    12/22/2021 01/14/2022 10/20/2022 01/03/2023  Weight /BMI      Weight 177 lb 3.2 oz  171 lb 3.2 oz  166 lb 12.8 oz  161 lb 6.4 oz   Height  6\' 2"  (1.88 m)   6\' 2"  (1.88 m)   BMI 22.75 kg/m2  21.98 kg/m2  21.42 kg/m2  20.72 kg/m2  01/19/2023 02/28/2023  Weight /BMI    Weight 161 lb 12.8 oz  158 lb 9.6 oz   Height  6\' 2"  (1.88 m)   BMI 20.77 kg/m2  20.36 kg/m2      Allergies  Allergen Reactions   Lipitor [Atorvastatin] Other (See Comments)    Body aches   Penicillins Swelling    Swollen face   Penicillin G     Other reaction(s): Unknown   Current Meds  Medication Sig   allopurinol (ZYLOPRIM) 300 MG tablet Take 300 mg by mouth daily.   empagliflozin (JARDIANCE) 10 MG TABS tablet take 1 tablet by mouth daily before breakfast   Ensure Max Protein (ENSURE MAX PROTEIN) LIQD Take 330 mLs (11 oz total) by mouth at bedtime.   glucose blood (ONETOUCH VERIO) test strip TEST ONCE A DAY DX E11.29   lansoprazole (PREVACID) 30 MG capsule Take 1 capsule (30 mg total) by mouth daily before  breakfast.   loratadine (CLARITIN) 10 MG tablet Take 10 mg by mouth daily as needed for allergies.   magnesium oxide (MAG-OX) 400 MG tablet Take 400 mg by mouth daily.   metFORMIN (GLUCOPHAGE) 500 MG tablet Take 1 tablet (500 mg total) by mouth 2 (two) times daily with a meal.   midodrine (PROAMATINE) 5 MG tablet Take 5 mg by mouth 2 (two) times daily with a meal.   Multiple Vitamins-Minerals (CERTAVITE/ANTIOXIDANTS) TABS Take 1 tablet by mouth daily.   Rivaroxaban (XARELTO) 15 MG TABS tablet Take 15 mg by mouth daily with supper.   rosuvastatin (CRESTOR) 5 MG tablet Take 5 mg by mouth every evening.   spironolactone (ALDACTONE) 25 MG tablet TAKE 1/2 OF A TABLET BY MOUTH DAILY   tadalafil, PAH, (ADCIRCA) 20 MG tablet Take 20 mg by mouth daily as needed (ED).   Tafamidis (VYNDAMAX) 61 MG CAPS Take 1 capsule by mouth daily.   torsemide (DEMADEX) 20 MG tablet Take 20 mg by mouth daily.   vutrisiran sodium (AMVUTTRA) 25 MG/0.5ML syringe Inject 25 mg into the skin every 3 (three) months. Last dose was October 2023   Past Medical History:  Diagnosis Date   Atrial fibrillation 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   HLD (hyperlipidemia)    Hx of adenomatous polyp of colon 12/03/2020   Hypertension    OSA on CPAP    Sleep apnea    Past Surgical History:  Procedure Laterality Date   ABLATION     AORTIC VALVE REPLACEMENT  2011   CERVICAL DISC SURGERY     spinal stenosis   COLONOSCOPY     ESOPHAGOGASTRODUODENOSCOPY     RIGHT/LEFT HEART CATH AND CORONARY/GRAFT ANGIOGRAPHY N/A 03/18/2021   Procedure: RIGHT/LEFT HEART CATH AND CORONARY/GRAFT ANGIOGRAPHY;  Surgeon: Corky Crafts, MD;  Location: [facility] INVASIVE CV LAB;  Service: Cardiovascular;  Laterality: N/A;   TONSILLECTOMY      Social History   Social History Narrative   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   Married 1 son 1 daughter   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   Review of Systems As per HPI  Objective:   Physical Exam BP 114/66   Pulse 82   Ht 6\' 2"  (1.88 m)   Wt 158 lb 9.6 oz (71.9 kg)   SpO2 96%   BMI 20.36 kg/m  Mildly chron ill Anicteric Lungs cta Cor ? Very soft HSM and ?gallop Tr ankle edema Abd mild-mod protuberant, soft and palpable firm liver in UQ 3 FB down, no splenomegaly detected

## 2023-03-01 LAB — IGG: IgG (Immunoglobin G), Serum: 2496 mg/dL — ABNORMAL HIGH (ref 600–1540)

## 2023-03-02 LAB — ANTI-NUCLEAR AB-TITER (ANA TITER): ANA TITER: 1:40 {titer} — ABNORMAL HIGH

## 2023-03-03 LAB — ANA: Anti Nuclear Antibody (ANA): POSITIVE — AB

## 2023-03-03 LAB — ALPHA-1-ANTITRYPSIN: A-1 Antitrypsin, Ser: 206 mg/dL — ABNORMAL HIGH (ref 83–199)

## 2023-03-03 LAB — ANTI-NUCLEAR AB-TITER (ANA TITER): ANA Titer 1: 1:40 {titer} — ABNORMAL HIGH

## 2023-03-03 LAB — MITOCHONDRIAL ANTIBODIES: Mitochondrial M2 Ab, IgG: <=20 U (ref <=20.0)

## 2023-03-03 LAB — ANTI-SMOOTH MUSCLE ANTIBODY, IGG: Actin (Smooth Muscle) Antibody (IGG): <20 U (ref <20)

## 2023-03-06 ENCOUNTER — Telehealth: Payer: Self-pay | Admitting: Internal Medicine

## 2023-03-06 ENCOUNTER — Other Ambulatory Visit (HOSPITAL_COMMUNITY): Payer: Self-pay

## 2023-03-06 ENCOUNTER — Telehealth (HOSPITAL_COMMUNITY): Payer: Self-pay | Admitting: Cardiology

## 2023-03-06 DIAGNOSIS — K7460 Unspecified cirrhosis of liver: Secondary | ICD-10-CM

## 2023-03-06 NOTE — Telephone Encounter (Signed)
Please see notes below and advise 

## 2023-03-06 NOTE — Telephone Encounter (Signed)
Patient called with concerns of shoulder and chest weakness Denies pain or tightness-describes discomfort as soreness Denies SOB, HA, Weakness, N/V Reports discomfort x 1 week No recent med changes Current weight 148-reports weight decreases despite need for PRN torsemide   Cath -03/18/21 Prox LAD lesion is 50% stenosed. Mid LAD lesion is 90% stenosed. Patent LIMA to LAD. Circumflex and RCA appear patent with mild disease. Ao sat 99%, PA sat 56% (repeat value was 60%); PA pressure 15 mm Hg; mean PCWP 5 mm Hg; CO 3.4 L/min; CI 1.6    PLEASE ADVISE

## 2023-03-06 NOTE — Telephone Encounter (Signed)
Inbound call from patient inquiring about results. Please advise.  Thank you  

## 2023-03-06 NOTE — Telephone Encounter (Signed)
No follow up scheduled  Pt given appt and appreciative of return call

## 2023-03-09 ENCOUNTER — Other Ambulatory Visit (INDEPENDENT_AMBULATORY_CARE_PROVIDER_SITE_OTHER): Payer: Medicare HMO

## 2023-03-09 ENCOUNTER — Telehealth: Payer: Self-pay

## 2023-03-09 DIAGNOSIS — K7460 Unspecified cirrhosis of liver: Secondary | ICD-10-CM

## 2023-03-09 LAB — BASIC METABOLIC PANEL
BUN: 56 mg/dL — ABNORMAL HIGH (ref 6–23)
CO2: 24 mEq/L (ref 19–32)
Calcium: 9.7 mg/dL (ref 8.4–10.5)
Chloride: 95 mEq/L — ABNORMAL LOW (ref 96–112)
Creatinine, Ser: 1.95 mg/dL — ABNORMAL HIGH (ref 0.40–1.50)
GFR: 34.64 mL/min — ABNORMAL LOW (ref >60.00)
Glucose, Bld: 126 mg/dL — ABNORMAL HIGH (ref 70–99)
Potassium: 4.3 mEq/L (ref 3.5–5.1)
Sodium: 131 mEq/L — ABNORMAL LOW (ref 135–145)

## 2023-03-09 MED ORDER — FERROUS SULFATE 325 (65 FE) MG PO TABS: 325.0000 mg | ORAL_TABLET | Freq: Every day | ORAL | 3 refills | Status: DC

## 2023-03-09 NOTE — Telephone Encounter (Signed)
Refer to alternate phone note from today. Request for clearance sent.

## 2023-03-09 NOTE — Telephone Encounter (Signed)
Dr. Gala Romney, pt's chart reviewed for preoperative cardiac evaluation.  Patient was seen by you on 01/19/23 and now is pending risk assessment for upcoming EGD and colonoscopy.  Could you please comment on risk assessment and route your response to p cv div preop.  Thank you, Marcelino Duster

## 2023-03-09 NOTE — Telephone Encounter (Signed)
[facility] Pre-operative Risk Assessment     Request for surgical clearance:     Endoscopy Procedure  What type of surgery is being performed?     EGD & Colonoscopy  When is this surgery scheduled?     TBD  What type of clearance is required ?   Pharmacy  Are there any medications that need to be held prior to surgery and how long? Xarelto 2 days   Practice name and name of physician performing surgery?      [facility]  What is your office phone and fax number?      Phone- [phone]  Fax- [phone]  Anesthesia type (None, local, MAC, general) ?       MAC

## 2023-03-09 NOTE — Telephone Encounter (Signed)
Reviewed lab results with patient.  #1 due to iron deficiency anemia he needs an EGD and a colonoscopy  Will need to hold Xarelto 2 days prior-needs clearance from Dr. Gala Romney  Will need to schedule at the hospital given comorbidities - I am not ready to schedule but will follow-up on that after lab review so just initiate the clearance request   #2 I had him come for repeat and additional labs today  These were ordered  Orders Placed This Encounter  Procedures   Basic Metabolic Panel (BMET)   Hepatitis B Core Antibody, total   Hepatitis A Ab, Total   Hepatitis B Surface AntiBODY   Hepatitis B Surface AntiGEN   Hepatitis C Antibody   #3 reviewed that he has a abnormal ANA and a high IgG which could indicate there is a component of autoimmune illness here though he has other possible reasons for his liver changes that include his heart failure and possibly his amyloidosis.

## 2023-03-09 NOTE — Telephone Encounter (Signed)
Patient with diagnosis of afib on Xarelto for anticoagulation.    Procedure: EGD and colonoscopy Date of procedure: TBD  CHA2DS2-VASc Score = 5  This indicates a 7.2% annual risk of stroke. The patient's score is based upon: CHF History: 1 HTN History: 1 Diabetes History: 1 Stroke History: 0 Vascular Disease History: 1 Age Score: 1 Gender Score: 0   CrCl 72mL/min  Platelet count 269K  Per office protocol, patient can hold Xarelto for 2 days prior to procedure as requested.    **This guidance is not considered finalized until pre-operative APP has relayed final recommendations.**

## 2023-03-10 ENCOUNTER — Encounter: Payer: Self-pay | Admitting: Internal Medicine

## 2023-03-10 LAB — HEPATITIS B SURFACE ANTIBODY,QUALITATIVE: Hep B S Ab: REACTIVE — AB

## 2023-03-10 LAB — HEPATITIS A ANTIBODY, TOTAL: Hepatitis A AB,Total: NONREACTIVE

## 2023-03-10 LAB — HEPATITIS B CORE ANTIBODY, TOTAL: Hep B Core Total Ab: REACTIVE — AB

## 2023-03-10 LAB — HEPATITIS C ANTIBODY: Hepatitis C Ab: NONREACTIVE

## 2023-03-10 LAB — HEPATITIS B SURFACE ANTIGEN: Hepatitis B Surface Ag: NONREACTIVE

## 2023-03-10 NOTE — Telephone Encounter (Signed)
This encounter was created in error - please disregard.

## 2023-03-10 NOTE — Telephone Encounter (Signed)
Per Dr. Gala Romney, he will address in clinic at upcoming appointment.  I will remove request from preop pool.  Levi Aland, NP-C  03/10/2023, 8:21 AM [address], [address] Office [phone] Fax [phone]

## 2023-03-14 NOTE — Telephone Encounter (Signed)
This encounter was created in error - please disregard.

## 2023-03-21 ENCOUNTER — Other Ambulatory Visit: Payer: Self-pay

## 2023-03-21 DIAGNOSIS — Z23 Encounter for immunization: Secondary | ICD-10-CM

## 2023-03-22 ENCOUNTER — Encounter (HOSPITAL_COMMUNITY): Payer: Self-pay

## 2023-03-22 ENCOUNTER — Telehealth (HOSPITAL_COMMUNITY): Payer: Self-pay

## 2023-03-22 NOTE — Telephone Encounter (Signed)
LMOM and mychart message sent.

## 2023-03-22 NOTE — Telephone Encounter (Signed)
Patient called stating he has extensive swelling in feet and ankles, going up into legs. His hands are also cramping. No other symptoms noted. He said this has been going on a few weeks now and wants to know what he needs to do.

## 2023-03-24 ENCOUNTER — Other Ambulatory Visit: Payer: Self-pay

## 2023-03-24 ENCOUNTER — Other Ambulatory Visit: Payer: Medicare HMO

## 2023-03-24 ENCOUNTER — Ambulatory Visit (INDEPENDENT_AMBULATORY_CARE_PROVIDER_SITE_OTHER): Payer: Medicare HMO | Admitting: Internal Medicine

## 2023-03-24 DIAGNOSIS — K7460 Unspecified cirrhosis of liver: Secondary | ICD-10-CM

## 2023-03-24 DIAGNOSIS — Z23 Encounter for immunization: Secondary | ICD-10-CM

## 2023-04-10 ENCOUNTER — Other Ambulatory Visit (HOSPITAL_COMMUNITY): Payer: Self-pay | Admitting: Internal Medicine

## 2023-04-24 DIAGNOSIS — Z960 Presence of urogenital implants: Secondary | ICD-10-CM

## 2023-04-24 HISTORY — DX: Presence of urogenital implants: Z96.0

## 2023-05-04 ENCOUNTER — Ambulatory Visit (HOSPITAL_COMMUNITY)
Admission: RE | Admit: 2023-05-04 | Discharge: 2023-05-04 | Disposition: A | Discharge status: Home / Self Care | Payer: Medicare HMO | Source: Ambulatory Visit | Attending: Internal Medicine | Admitting: Internal Medicine

## 2023-05-04 ENCOUNTER — Encounter (HOSPITAL_COMMUNITY): Payer: Self-pay | Admitting: Internal Medicine

## 2023-05-04 VITALS — BP 88/60 | HR 71 | Wt 154.2 lb

## 2023-05-04 DIAGNOSIS — I951 Orthostatic hypotension: Secondary | ICD-10-CM | POA: Diagnosis not present

## 2023-05-04 DIAGNOSIS — Z953 Presence of xenogenic heart valve: Secondary | ICD-10-CM | POA: Diagnosis not present

## 2023-05-04 DIAGNOSIS — I480 Paroxysmal atrial fibrillation: Secondary | ICD-10-CM | POA: Insufficient documentation

## 2023-05-04 DIAGNOSIS — I2510 Atherosclerotic heart disease of native coronary artery without angina pectoris: Secondary | ICD-10-CM | POA: Insufficient documentation

## 2023-05-04 DIAGNOSIS — I498 Other specified cardiac arrhythmias: Secondary | ICD-10-CM

## 2023-05-04 DIAGNOSIS — E1122 Type 2 diabetes mellitus with diabetic chronic kidney disease: Secondary | ICD-10-CM | POA: Diagnosis not present

## 2023-05-04 DIAGNOSIS — Z66 Do not resuscitate: Secondary | ICD-10-CM | POA: Insufficient documentation

## 2023-05-04 DIAGNOSIS — I43 Cardiomyopathy in diseases classified elsewhere: Secondary | ICD-10-CM | POA: Diagnosis not present

## 2023-05-04 DIAGNOSIS — E854 Organ-limited amyloidosis: Secondary | ICD-10-CM | POA: Diagnosis not present

## 2023-05-04 DIAGNOSIS — N1831 Chronic kidney disease, stage 3a: Secondary | ICD-10-CM | POA: Diagnosis not present

## 2023-05-04 DIAGNOSIS — R188 Other ascites: Secondary | ICD-10-CM

## 2023-05-04 DIAGNOSIS — Z7901 Long term (current) use of anticoagulants: Secondary | ICD-10-CM | POA: Diagnosis not present

## 2023-05-04 DIAGNOSIS — K7460 Unspecified cirrhosis of liver: Secondary | ICD-10-CM

## 2023-05-04 DIAGNOSIS — G4733 Obstructive sleep apnea (adult) (pediatric): Secondary | ICD-10-CM | POA: Insufficient documentation

## 2023-05-04 DIAGNOSIS — Z951 Presence of aortocoronary bypass graft: Secondary | ICD-10-CM | POA: Diagnosis present

## 2023-05-04 DIAGNOSIS — I130 Hypertensive heart and chronic kidney disease with heart failure and stage 1 through stage 4 chronic kidney disease, or unspecified chronic kidney disease: Secondary | ICD-10-CM | POA: Insufficient documentation

## 2023-05-04 DIAGNOSIS — Z7984 Long term (current) use of oral hypoglycemic drugs: Secondary | ICD-10-CM | POA: Insufficient documentation

## 2023-05-04 DIAGNOSIS — Z952 Presence of prosthetic heart valve: Secondary | ICD-10-CM

## 2023-05-04 DIAGNOSIS — I5032 Chronic diastolic (congestive) heart failure: Secondary | ICD-10-CM

## 2023-05-04 DIAGNOSIS — I5022 Chronic systolic (congestive) heart failure: Secondary | ICD-10-CM | POA: Diagnosis not present

## 2023-05-04 DIAGNOSIS — E1142 Type 2 diabetes mellitus with diabetic polyneuropathy: Secondary | ICD-10-CM | POA: Diagnosis not present

## 2023-05-04 LAB — CBC
HCT: 36.3 % — ABNORMAL LOW (ref 39.0–52.0)
Hemoglobin: 12 g/dL — ABNORMAL LOW (ref 13.0–17.0)
MCH: 28.7 pg (ref 26.0–34.0)
MCHC: 33.1 g/dL (ref 30.0–36.0)
MCV: 86.8 fL (ref 80.0–100.0)
Platelets: 247 10*3/uL (ref 150–400)
RBC: 4.18 MIL/uL — ABNORMAL LOW (ref 4.22–5.81)
RDW: 21.3 % — ABNORMAL HIGH (ref 11.5–15.5)
WBC: 4.1 10*3/uL (ref 4.0–10.5)
nRBC: 0 % (ref 0.0–0.2)

## 2023-05-04 LAB — COMPREHENSIVE METABOLIC PANEL
ALT: 10 U/L (ref 0–44)
AST: 50 U/L — ABNORMAL HIGH (ref 15–41)
Albumin: 3.9 g/dL (ref 3.5–5.0)
Alkaline Phosphatase: 127 U/L — ABNORMAL HIGH (ref 38–126)
Anion gap: 15 (ref 5–15)
BUN: 78 mg/dL — ABNORMAL HIGH (ref 8–23)
CO2: 24 mmol/L (ref 22–32)
Calcium: 10.1 mg/dL (ref 8.9–10.3)
Chloride: 92 mmol/L — ABNORMAL LOW (ref 98–111)
Creatinine, Ser: 2.42 mg/dL — ABNORMAL HIGH (ref 0.61–1.24)
GFR, Estimated: 28 mL/min — ABNORMAL LOW (ref >60)
Glucose, Bld: 115 mg/dL — ABNORMAL HIGH (ref 70–99)
Potassium: 5.5 mmol/L — ABNORMAL HIGH (ref 3.5–5.1)
Sodium: 131 mmol/L — ABNORMAL LOW (ref 135–145)
Total Bilirubin: 1.4 mg/dL — ABNORMAL HIGH (ref 0.3–1.2)
Total Protein: 8.2 g/dL — ABNORMAL HIGH (ref 6.5–8.1)

## 2023-05-04 LAB — PROTIME-INR
INR: 2.8 — ABNORMAL HIGH (ref 0.8–1.2)
Prothrombin Time: 29.8 seconds — ABNORMAL HIGH (ref 11.4–15.2)

## 2023-05-04 LAB — BRAIN NATRIURETIC PEPTIDE: B Natriuretic Peptide: 855.8 pg/mL — ABNORMAL HIGH (ref 0.0–100.0)

## 2023-05-04 MED ORDER — MIDODRINE HCL 10 MG PO TABS: 10.0000 mg | ORAL_TABLET | Freq: Two times a day (BID) | ORAL | 11 refills | Status: DC

## 2023-05-04 NOTE — Patient Instructions (Signed)
Medication Changes:  INCREASE MIDODRINE TO 10mg  TWICE DAILY   Lab Work:  Labs done today, your results will be available in MyChart, we will contact you for abnormal readings.   Testing/Procedures:  IR for PARACENTESIS- WE WILL CALL TO SCHEDULE THIS   Referrals:  REFERRAL TO PALLIATIVE CARE- SOMEONE WILL REACH OUT TO YOU REGARDING THIS    Follow-Up in: 3 MONTHS WITH Dr. Gala Romney PLEASE CALL OUR OFFICE AROUND SEPTEMBER TO GET SCHEDULED FOR YOUR APPOINTMENT. PHONE NUMBER IS [phone] OPTION 2    At the [facility], you and your health needs are our priority. We have a designated team specialized in the treatment of Heart Failure. This Care Team includes your primary Heart Failure Specialized Cardiologist (physician), Advanced Practice Providers (APPs- Physician Assistants and Nurse Practitioners), and Pharmacist who all work together to provide you with the care you need, when you need it.   You may see any of the following providers on your designated Care Team at your next follow up:  Dr. Arvilla Meres Dr. Marca Ancona Dr. Marcos Eke, NP Robbie Lis, [place] [facility] [place], [place] Brynda Peon, NP Karle Plumber, PharmD   Please be sure to bring in all your medications bottles to every appointment.   Need to Contact us:  If you have any questions or concerns before your next appointment please send [place] a message through [place] or call our office at [phone].    TO LEAVE A MESSAGE FOR THE NURSE SELECT OPTION 2, PLEASE LEAVE A MESSAGE INCLUDING: YOUR NAME DATE OF BIRTH CALL BACK NUMBER REASON FOR CALL**this is important as we prioritize the call backs  YOU WILL RECEIVE A CALL BACK THE SAME DAY AS LONG AS YOU CALL BEFORE 4:00 PM

## 2023-05-04 NOTE — Progress Notes (Addendum)
ADVANCED HF CLINIC NOTE  Primary Care: Adrian Prince, MD Primary Cardiologist: Lance Muss, MD HF Cardiologist: Dr. Gala Romney [facility]: Dr Cherly Hensen  HPI: Frank Berg is a 67 y.o. male referred by Dr. Eldridge Dace for further evaluation of his HF.   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 Also has h/o AF s/p AF ablation at [facility] in 2017. CAD s/p previous stent (2008) followed by LIMA to LAD at time of AVR. TTR Amyloid/   Admitted in 4/22 with near syncope. EF 45% by echo. RV mildly HK. RVSP   R/L cath 5/22: mLAD 90% (patent LIMA to LAD), LCX and RCA minimal CAD.  RA 9 RV 24/0 PA 25/8 (15) PCWP 5 Fick 3.4/1.6  We saw him for the first time on 08/23/21 for AHF consult. W/u revealed severe TTR cardiac amyloidosis. Started on tafamadis. CPX with marked HF limitation.  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 Diuresed 14 pounds. D/c weight 171 on Monday. Started on torsemide 40 but hasn't taken it yet.  Echo 12/17/21: EF 45-50% severe LVH RV mod HK.  Follow up 3/23, volume increasing, torsemide 40 mg daily started.   Transferred his care to [facility]. Started on Amvuttra in 4/23 by Neurology  Then moved to CT and was seen at [facility]. CPX test in 6/23 showed pVO2 9.8 but apparently didn't reach AT so felt to be submax test. No RER or VE/VCO2 slope reported.   Saw Neuro at [facility] on 02/26/23. Given steroind injection.   3/24 RUQ u/s with cirrhosis  Today he returns for HF follow up for HF and surgical clearance. Feeling weak and was having a hard time taking care of himself. So has moved back him with his ex-wife. Very weak. Has to take his time but can get around the house with mild SOB. Ongoing neuropathy in hands.Having abdominal swelling. No leg swelling. Dry weight at home 150. Today 151 pounds. Taking all medications.Trying to do some seated exercises. Taking midodrine 2x/day. Appetite poor. Frequently  lightheaded.   Cardiac studies:  - CPX test in 6/23 [facility]) showed pVO2 9.8 but apparently didn't reach AT so felt to be submax test. No RER or VE/VCO2 slope reported.  - Echo (2/23): EF 45-50%, severe LVH, RV moderate HK  - CPX 09/07/21  FVC 3.06  (74%)      FEV1 2.30 (73%)        FEV1/FVC 75 (98%)        MVV 82 (58%)       BP rest: 94/64 Standing BP: 86/64 BP peak: 100/58  Peak VO2: 10.4 (36% predicted peak VO2)  VE/VCO2 slope:  48  OUES: 1.04  Peak RER: 1.09  Ventilatory Threshold: 9.3 (33% predicted or measured peak VO2)  O2pulse:  8   (46% predicted O2pulse  - cMRI 09/22/21 1. Mild LVE with moderate LVH diffuse hypokinesis EF 40% 2. Markedly abnormal post gadolinium images with 41.8% myocardial uptake over all axial slices Although there is a subendocardial and mid myocardial predominance and worse uptake in the basal segments there is also transmural uptake in some areas not contained to a coronary distribution 3. Degenerative appearing Bioprosthetic AVR with mild appearing AR and thickened leaflets 4.  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     empagliflozin (JARDIANCE) 10 MG TABS tablet take 1 tablet by mouth daily before breakfast 90 tablet 3   Ensure Max Protein (ENSURE MAX PROTEIN) LIQD Take 330 mLs (11 oz total) by mouth at bedtime. 3330 mL 0   ferrous  sulfate 325 (65 FE) MG tablet Take 1 tablet (325 mg total) by mouth daily with breakfast.  3   glucose blood (ONETOUCH VERIO) test strip TEST ONCE A DAY DX E11.29     loratadine (CLARITIN) 10 MG tablet Take 10 mg by mouth daily as needed for allergies.     midodrine (PROAMATINE) 5 MG tablet Take 5 mg by mouth 2 (two) times daily with a meal.     Multiple Vitamins-Minerals (CERTAVITE/ANTIOXIDANTS) TABS Take 1 tablet by mouth daily. 30 tablet 0   Rivaroxaban (XARELTO) 15 MG TABS tablet Take 15 mg by mouth daily with supper.     rosuvastatin (CRESTOR) 5 MG tablet Take 5 mg by mouth every evening.     spironolactone (ALDACTONE) 25 MG tablet TAKE 1/2 (HALF) A TABLET BY MOUTH ONCE DAILY 45 tablet 3   tadalafil, PAH, (ADCIRCA) 20 MG tablet Take 20 mg by mouth daily as needed (ED).     Tafamidis (VYNDAMAX) 61 MG CAPS Take 1 capsule by mouth daily. 90 capsule 3   torsemide (DEMADEX) 20 MG tablet Take 20 mg by mouth daily.     vutrisiran sodium (AMVUTTRA) 25 MG/0.5ML syringe Inject 25 mg into the skin every 3 (three) months. Last dose was October 2023     No current facility-administered medications for this encounter.   Allergies  Allergen Reactions   Lipitor [Atorvastatin] Other (See Comments)    Body aches   Penicillins Swelling    Swollen face   Penicillin G     Other reaction(s): Unknown   Social History   Socioeconomic History   Marital status: Single    Spouse name: Not on file   Number of children: 2   Years of education: Not on file   Highest education level: Not on file  Occupational History   Occupation: retired  Tobacco Use   Smoking status: Former    Current packs/day: 0.00    Average packs/day: 0.3 packs/day for 10.0 years (2.5 ttl pk-yrs)    Types: Cigarettes    Start date: 11/04/1971    Quit date: 11/03/1981    Years since quitting: 41.5   Smokeless tobacco: Never  Vaping Use   Vaping status: Never Used  Substance and Sexual Activity   Alcohol use: Yes    Comment:  occasional   Drug use: No   Sexual activity: Not on file  Other Topics Concern   Not on file  Social History Narrative   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   Married 1 son 1 daughter   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  Other jobs including working at the [employer], providing health care for disabled, 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, 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   Social Determinants of Corporate investment banker Strain: Low Risk  (07/06/2022)   Received from [facility]   Overall Financial Resource Strain (CARDIA)    Difficulty of Paying Living Expenses: Not hard at all  Food Insecurity: No Food Insecurity (07/06/2022)   [facility]   Hunger Vital Sign    Worried About Running Out of Food in the Last Year: Never true    Ran Out of Food in the Last Year: Never true  Transportation Needs: No Transportation Needs (07/06/2022)   [facility]   [facility] - Transportation    Lack of Transportation (Medical): No    Lack of Transportation (Non-Medical): No  Physical Activity: Not on file  Stress: Not on file  Social Connections: Unknown (03/07/2022)   Received from [facility]   Social Network    Social Network: Not on file  Intimate Partner Violence: Unknown (01/27/2022)   Received from [facility]   HITS    Physically Hurt: Not on file    Insult or Talk Down To: Not on file    Threaten Physical Harm: Not on file    Scream or Curse: Not on file   Family History  Problem Relation Age of Onset   Congenital heart disease Mother    Diabetes Mother    Hypertension Mother    Heart disease Mother    Heart attack Father    Cervical cancer Sister     Diabetes Sister    Hypertension Brother    Diabetes Brother    Pancreatic cancer Brother    Diabetes Brother    Breast cancer Sister    Diabetes Sister    Diabetes Sister    Diabetes Sister    Diabetes Sister    Diabetes Son    Colon cancer Neg Hx    Colon polyps Neg Hx    Esophageal cancer Neg Hx    Stomach cancer Neg Hx    BP (!) 88/60   Pulse 71   Wt 69.9 kg (154 lb 3.2 oz)   SpO2 100%   BMI 19.80 kg/m    Wt Readings from Last 3 Encounters:  05/04/23 69.9 kg (154 lb 3.2 oz)  02/28/23 71.9 kg (158 lb 9.6 oz)  01/19/23 73.4 kg (161 lb 12.8 oz)   PHYSICAL EXAM: General:  Thin frail No resp difficulty HEENT: normal Neck: supple. no JVD. Carotids 2+ bilat; no bruits. No lymphadenopathy or thryomegaly appreciated. Cor: PMI nondisplaced. Regular rate & rhythm. No rubs, gallops or murmurs. Lungs: clear Abdomen: soft, nontender, distended. No hepatosplenomegaly. No bruits or masses. Good bowel sounds. Extremities: no cyanosis, clubbing, rash, edema Neuro: alert & orientedx3, cranial nerves grossly intact. moves all 4 extremities w/o difficulty. Affect pleasant  ECG: Junctional rhythm RBBB 70 Personally reviewed  ASSESSMENT & PLAN:   1. 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 RVSP with question of mild D-shaped septum - R/L cath 5/22: mLAD 90% (patent LIMA to LAD), LCX and RCA minimal CAD.  RA 9 RV 24/0 PA 25/8 (15) PCWP 5 Fick 3.4/1.6CPX 09/07/21 - CPX 11/22: pVO2  10.4 VE/VCO2 slope:  48, pRER: 1.09  - cMRI 09/22/21: EF 40% RVEF 46%  diffuses LGE and markedly elevated ECW ~71%. degenerative appearing Bioprosthetic AVR with mild AI. Reviewed with imaging team and felt to be c/w advanced cardiac amyloidosis. - PYP 1/22 markedly positive. 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 Diuresed 14 pounds - CPX confirms severe HF  limitation - Echo 5/23 EF 38% at [facility]  - Now end-stage NYHA III-IV. BP low - Has been off bb for some time.  - Continue Jardiance 10 mg daily. - Continue spiro 12.5 mg daily.  - Increase midodrine to 10 mg twice a day - Not candidate for advanced therapies with cachexia and CKD - We discussed the fact that he is end-stage.  - We filled out DNR/DNI form. Will refer to [facility]  2. TTR cardiac amyloidosis with polyneuropathy - PYP 1/22 markedly positive. 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   3. CAD - S/p previous stent 2008 (unknown vessel) & LIMA to LAD 2011 - No s/s angina - Continue statin. No ASA with Xarelto  4.  Bicuspid AoV s/p bioprosthetic AVR 2011 - Stable on previous echo. - Aware of need for SBE prophylaxis  5. 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 PAF - s/p AF ablation 2017 - No recurrence - Continue Xarelto. No bleeding.  6. OSA - Compliant with CPAP.  - No change.  7. DM2 - Continue Jardiance.  8. CKD3a - Baseline SCr 1.4-1.6 - Continue SGLT2i  9. Orthostatic hypotension - Increase midodrine  10. Junctional rhythm - at high risk for progressive conduction disease with TTR amyloid.   11. Goals of care - We discussed the fact that he is end-stage.  - We filled out DNR/DNI form. Will refer to [facility]   Total time spent 45 minutes. Over half that time spent discussing above.    Arvilla Meres, MD  12:17 PM  -

## 2023-05-05 ENCOUNTER — Telehealth (HOSPITAL_COMMUNITY): Payer: Self-pay | Admitting: Cardiology

## 2023-05-05 DIAGNOSIS — I5032 Chronic diastolic (congestive) heart failure: Secondary | ICD-10-CM

## 2023-05-05 NOTE — Telephone Encounter (Signed)
Patient called.  Patient aware.  

## 2023-05-05 NOTE — Telephone Encounter (Signed)
-----   Message from Arvilla Meres sent at 05/05/2023 11:18 AM EDT ----- Stop spiro due to elevated K.   Repeat BMET in 1 week

## 2023-05-11 ENCOUNTER — Other Ambulatory Visit (HOSPITAL_COMMUNITY): Payer: Self-pay | Admitting: Cardiology

## 2023-05-11 ENCOUNTER — Other Ambulatory Visit: Payer: Self-pay

## 2023-05-11 MED ORDER — TORSEMIDE 20 MG PO TABS: 20.0000 mg | ORAL_TABLET | Freq: Every day | ORAL | 3 refills | Status: DC

## 2023-05-11 NOTE — Telephone Encounter (Signed)
This is a CHF patient.

## 2023-05-12 ENCOUNTER — Ambulatory Visit (HOSPITAL_COMMUNITY)
Admission: RE | Admit: 2023-05-12 | Discharge: 2023-05-12 | Disposition: A | Discharge status: Home / Self Care | Payer: Medicare HMO | Source: Ambulatory Visit | Attending: Cardiology | Admitting: Cardiology

## 2023-05-12 ENCOUNTER — Ambulatory Visit (HOSPITAL_COMMUNITY)
Admission: RE | Admit: 2023-05-12 | Discharge: 2023-05-12 | Disposition: A | Discharge status: Home / Self Care | Payer: Medicare HMO | Source: Ambulatory Visit | Attending: Internal Medicine | Admitting: Internal Medicine

## 2023-05-12 ENCOUNTER — Encounter (HOSPITAL_COMMUNITY): Payer: Self-pay

## 2023-05-12 DIAGNOSIS — R188 Other ascites: Secondary | ICD-10-CM | POA: Diagnosis not present

## 2023-05-12 DIAGNOSIS — I5032 Chronic diastolic (congestive) heart failure: Secondary | ICD-10-CM

## 2023-05-12 HISTORY — PX: IR PARACENTESIS: IMG2679

## 2023-05-12 LAB — BASIC METABOLIC PANEL
Anion gap: 9 (ref 5–15)
BUN: 57 mg/dL — ABNORMAL HIGH (ref 8–23)
CO2: 24 mmol/L (ref 22–32)
Calcium: 9.5 mg/dL (ref 8.9–10.3)
Chloride: 99 mmol/L (ref 98–111)
Creatinine, Ser: 2.09 mg/dL — ABNORMAL HIGH (ref 0.61–1.24)
GFR, Estimated: 34 mL/min — ABNORMAL LOW (ref >60)
Glucose, Bld: 99 mg/dL (ref 70–99)
Potassium: 4.5 mmol/L (ref 3.5–5.1)
Sodium: 132 mmol/L — ABNORMAL LOW (ref 135–145)

## 2023-05-12 NOTE — Procedures (Signed)
Ultrasound-guided therapeutic paracentesis performed yielding 2 liters of amber colored fluid. No immediate complications. EBL is non.

## 2023-05-22 ENCOUNTER — Telehealth (HOSPITAL_COMMUNITY): Payer: Self-pay

## 2023-05-22 NOTE — Telephone Encounter (Signed)
Patient called to check the status of his paracentesis. Please advise

## 2023-05-23 ENCOUNTER — Telehealth (HOSPITAL_COMMUNITY): Payer: Self-pay

## 2023-05-23 NOTE — Telephone Encounter (Signed)
Patient called asking to see if he can have another Paracentesis done. He had one last week. Informed patient that this procedure only  gets done when the fluid gets to a certain level. As it was done last week it would not be done again this soon. Patient did not sound short of breath or in any discomfort.

## 2023-05-23 NOTE — Telephone Encounter (Signed)
Attempted to call patient- unable to reach. Left message to call back.   Patient had paracentesis on 7/19 with removal of 3 liters of fluid.

## 2023-05-24 ENCOUNTER — Other Ambulatory Visit: Payer: Self-pay

## 2023-05-26 ENCOUNTER — Other Ambulatory Visit: Payer: Self-pay

## 2023-05-29 ENCOUNTER — Other Ambulatory Visit: Payer: Self-pay

## 2023-05-31 ENCOUNTER — Other Ambulatory Visit (HOSPITAL_COMMUNITY): Payer: Medicare HMO

## 2023-06-01 ENCOUNTER — Inpatient Hospital Stay (HOSPITAL_COMMUNITY)
Admission: RE | Admit: 2023-06-01 | Discharge: 2023-06-01 | Disposition: A | Discharge status: Home / Self Care | Payer: Medicare HMO | Source: Ambulatory Visit

## 2023-06-01 ENCOUNTER — Telehealth (HOSPITAL_COMMUNITY): Payer: Self-pay | Admitting: Cardiology

## 2023-06-01 ENCOUNTER — Telehealth: Payer: Self-pay

## 2023-06-01 ENCOUNTER — Telehealth: Admit: 2023-06-01 | Payer: PRIVATE HEALTH INSURANCE | Primary: Internal Medicine

## 2023-06-01 ENCOUNTER — Telehealth: Admit: 2023-06-01 | Payer: PRIVATE HEALTH INSURANCE | Attending: Cardiovascular Disease | Primary: Internal Medicine

## 2023-06-01 ENCOUNTER — Encounter: Admit: 2023-06-01 | Payer: PRIVATE HEALTH INSURANCE | Primary: Internal Medicine

## 2023-06-01 DIAGNOSIS — E854 Organ-limited amyloidosis: Secondary | ICD-10-CM

## 2023-06-01 NOTE — Telephone Encounter
PHARMACY NOTE: SCHEDULING MISSED CALL 	I have left a voicemail for Frank Bradley on 06/01/2023 at 12:38 PM  to schedule an Initial appointment with [facility] pharmacist. This is the first call attempt.When patient returns telephone call, please schedule as follows: Appt Type: Phone Consult or MyChart Video visitPharmacist: Lilian Kapur reason/Notes:Medication name: VyndamaxInitial AppointmentPertinent notes from referral: Order Priority: Routine Duration of Appt: 60 minutesMe'arshaye Zacharius Funari, CPHTMedication Management Clinic [facility] Main Phone: [phone]

## 2023-06-01 NOTE — Telephone Encounter
Pt is asking for a call back from Dr.Miller -He has some questions about his mediation Vyndmax & Amvuttra

## 2023-06-01 NOTE — Telephone Encounter
Thanks. Dr. Hyacinth Meeker is away this week. I have placed a referral to Pharmacy so they can speak to him about meds and clarify any questions.Thanks.

## 2023-06-01 NOTE — Telephone Encounter (Signed)
Please contact pt to schedule a OV.  He has not been seen since Nov 2022. We can not order anything until he is seen.  If machine is not functioning correctly, he will need to contact DME [facility].

## 2023-06-01 NOTE — Telephone Encounter (Signed)
During intake visit for palliave HHRN Frank Berg [phone]) also noted significant swelling in BLE 2+edema, abdominal distension, increase in SOB  Ok to increase diuretics?  Currently taking torsemide 20 mg daily Is not compliant with fluid restrictions

## 2023-06-01 NOTE — Telephone Encounter (Signed)
Pt calling about issues with CPAP, pt last OV in 2022 so may need new OV to be able to have SS if needing a new CPAP machine. Pt did not specify concern - just that he needs a call back regarding an issue with his machine.

## 2023-06-01 NOTE — Telephone Encounter (Signed)
Patient enrolled in palliative services as of 8/8 Scheduled for amvutra inj on 8/15, patient and palliative care wanted to know if inj is still needed.

## 2023-06-02 MED ORDER — TORSEMIDE 20 MG PO TABS: ORAL_TABLET | ORAL | 3 refills | Status: DC

## 2023-06-02 NOTE — Telephone Encounter (Signed)
Per Dr Gala Romney The benefits of this medication are long term ok to discontinue.  Will with Palliative aware

## 2023-06-02 NOTE — Telephone Encounter (Signed)
LLMOM  [phone] (M)

## 2023-06-02 NOTE — Telephone Encounter (Signed)
Pt aware of torsemide changes -med list updated

## 2023-06-05 ENCOUNTER — Other Ambulatory Visit (HOSPITAL_COMMUNITY): Payer: Self-pay | Admitting: Pharmacist

## 2023-06-05 ENCOUNTER — Ambulatory Visit: Admit: 2023-06-05 | Payer: PRIVATE HEALTH INSURANCE | Attending: Critical Care | Primary: Internal Medicine

## 2023-06-06 NOTE — Telephone Encounter
Attempted to call pt. Left a voicemail to call back and state specific questions to Dr. Hyacinth Meeker that I can relay. Office number provided.

## 2023-06-07 NOTE — Telephone Encounter
Attempted to call pt. Left a voicemail that I called yesterday and left a message and following up today for specific questions on Vyndamax and Amvuttra. Office number provided for call back.

## 2023-06-08 ENCOUNTER — Other Ambulatory Visit (HOSPITAL_COMMUNITY): Payer: Medicare HMO

## 2023-06-12 ENCOUNTER — Encounter: Payer: Self-pay | Admitting: Adult Health

## 2023-06-12 ENCOUNTER — Ambulatory Visit: Payer: Medicare HMO | Admitting: Adult Health

## 2023-06-12 VITALS — BP 89/68 | HR 85 | Ht 74.0 in | Wt 167.0 lb

## 2023-06-12 DIAGNOSIS — G4733 Obstructive sleep apnea (adult) (pediatric): Secondary | ICD-10-CM

## 2023-06-14 ENCOUNTER — Telehealth: Payer: Self-pay | Admitting: Adult Health

## 2023-06-14 ENCOUNTER — Other Ambulatory Visit: Payer: Self-pay

## 2023-06-14 NOTE — Telephone Encounter (Signed)
Split- [employer] pending uploaded notes.

## 2023-06-17 ENCOUNTER — Other Ambulatory Visit (HOSPITAL_COMMUNITY): Payer: Self-pay | Admitting: Internal Medicine

## 2023-06-20 NOTE — Telephone Encounter (Signed)
Split-auth#[id number] 8/21-2/19/2025 (pt on CPAP at home but still having residual AHI)   Patient is scheduled at [facility] for 07/09/23 at 9 pm.  Mailed packet to the patient.

## 2023-06-21 ENCOUNTER — Telehealth (HOSPITAL_COMMUNITY): Payer: Self-pay | Admitting: Cardiology

## 2023-06-21 ENCOUNTER — Other Ambulatory Visit (HOSPITAL_COMMUNITY): Payer: Self-pay | Admitting: Nurse Practitioner

## 2023-06-21 DIAGNOSIS — R188 Other ascites: Secondary | ICD-10-CM

## 2023-06-21 NOTE — Telephone Encounter (Signed)
Will, RN with palliative called to request a phone call from provider to patient and patients spouse to discuss goals of care.  Patient is interested to transition to hospice however wife is not, would like to continue all meds. Wife is not often present during palliative home visits and unclear of outcome.  Per palliative patient may not be strong enough to return to office for full visit, telephone call would be best if possible  Please advise

## 2023-06-21 NOTE — Telephone Encounter (Signed)
Pt scheduled for video visit 8/29 @ 1045 Pt aware and reports wife would be present  Will with [facility] aware

## 2023-06-22 ENCOUNTER — Telehealth (HOSPITAL_BASED_OUTPATIENT_CLINIC_OR_DEPARTMENT_OTHER): Payer: Medicare HMO | Admitting: Internal Medicine

## 2023-06-22 DIAGNOSIS — K7460 Unspecified cirrhosis of liver: Secondary | ICD-10-CM | POA: Diagnosis not present

## 2023-06-22 DIAGNOSIS — E854 Organ-limited amyloidosis: Secondary | ICD-10-CM

## 2023-06-22 DIAGNOSIS — R188 Other ascites: Secondary | ICD-10-CM

## 2023-06-22 DIAGNOSIS — I498 Other specified cardiac arrhythmias: Secondary | ICD-10-CM | POA: Diagnosis not present

## 2023-06-22 DIAGNOSIS — N1831 Chronic kidney disease, stage 3a: Secondary | ICD-10-CM

## 2023-06-22 DIAGNOSIS — E852 Heredofamilial amyloidosis, unspecified: Secondary | ICD-10-CM | POA: Diagnosis not present

## 2023-06-22 DIAGNOSIS — I43 Cardiomyopathy in diseases classified elsewhere: Secondary | ICD-10-CM

## 2023-06-22 NOTE — Progress Notes (Signed)
ADVANCED HF CLINIC NOTE  Primary Care: Adrian Prince, MD Primary Cardiologist: Lance Muss, MD HF Cardiologist: Dr. Gala Romney [facility]: Dr Cherly Hensen  HPI: [name] is a 67 y.o. male referred by Dr. Eldridge Dace for further evaluation of his HF.   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 Also has h/o AF s/p AF ablation at [facility] in 2017. CAD s/p previous stent (2008) followed by LIMA to LAD at time of AVR. TTR Amyloid/   Admitted in 4/22 with near syncope. EF 45% by echo. RV mildly HK. RVSP   R/L cath 5/22: mLAD 90% (patent LIMA to LAD), LCX and RCA minimal CAD.  RA 9 RV 24/0 PA 25/8 (15) PCWP 5 Fick 3.4/1.6  We saw him for the first time on 08/23/21 for AHF consult. W/u revealed severe TTR cardiac amyloidosis. Started on tafamadis. CPX with marked HF limitation.  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 Diuresed 14 pounds. D/c weight 171 on Monday. Started on torsemide 40 but hasn't taken it yet.  Echo 12/17/21: EF 45-50% severe LVH RV mod HK.  Follow up 3/23, volume increasing, torsemide 40 mg daily started.   Transferred his care to [facility] ([place] Started on Amvuttra in 4/23 by Neurology  Then moved to CT and was seen at [facility]. CPX test in 6/23 showed pVO2 9.8 but apparently didn't reach AT so felt to be submax test. No RER or VE/VCO2 slope reported.   Saw Neuro at [facility] on 02/26/23. Given steroind injection.   3/24 RUQ u/s with cirrhosis  He presents today for an unscheduled video visit at the request of his Palliative Care RN. Says he feels ok. Functional capacity limited but able to do ADLs. Scheduled for paracentesis    120/70 Wt 167  Cardiac studies:  - CPX test in 6/23 [facility]) showed pVO2 9.8 but apparently didn't reach AT so felt to be submax test. No RER or VE/VCO2 slope reported.  - Echo (2/23): EF 45-50%, severe LVH, RV moderate HK  - CPX 09/07/21  FVC 3.06  (74%)      FEV1 2.30 (73%)         FEV1/FVC 75 (98%)        MVV 82 (58%)       BP rest: 94/64 Standing BP: 86/64 BP peak: 100/58  Peak VO2: 10.4 (36% predicted peak VO2)  VE/VCO2 slope:  48  OUES: 1.04  Peak RER: 1.09  Ventilatory Threshold: 9.3 (33% predicted or measured peak VO2)  O2pulse:  8   (46% predicted O2pulse  - cMRI 09/22/21 1. Mild LVE with moderate LVH diffuse hypokinesis EF 40% 2. Markedly abnormal post gadolinium images with 41.8% myocardial uptake over all axial slices Although there is a subendocardial and mid myocardial predominance and worse uptake in the basal segments there is also transmural uptake in some areas not contained to a coronary distribution 3. Degenerative appearing Bioprosthetic AVR with mild appearing AR and thickened leaflets 4.  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     colchicine 0.6 MG tablet Take by mouth as needed.     empagliflozin (JARDIANCE) 10 MG TABS tablet take 1 tablet by mouth daily before breakfast 90 tablet 3   Ensure Max Protein (ENSURE MAX PROTEIN) LIQD Take 330 mLs (11 oz total) by mouth at bedtime. 3330 mL 0   ferrous sulfate 325 (65 FE) MG tablet Take 1 tablet (325 mg total) by mouth daily with breakfast.  3   glucose blood (ONETOUCH VERIO) test strip TEST ONCE A DAY DX E11.29     loratadine  (CLARITIN) 10 MG tablet Take 10 mg by mouth daily as needed for allergies.     midodrine (PROAMATINE) 10 MG tablet Take 1 tablet (10 mg total) by mouth 2 (two) times daily with a meal. 60 tablet 11   mirtazapine (REMERON) 15 MG tablet Take 15 mg by mouth at bedtime.     Multiple Vitamins-Minerals (CERTAVITE/ANTIOXIDANTS) TABS Take 1 tablet by mouth daily. 30 tablet 0   Rivaroxaban (XARELTO) 15 MG TABS tablet Take 15 mg by mouth daily with supper.     rosuvastatin (CRESTOR) 5 MG tablet Take 5 mg by mouth every evening.     tadalafil, PAH, (ADCIRCA) 20 MG tablet Take 20 mg by mouth daily as needed (ED).     Tafamidis (VYNDAMAX) 61 MG CAPS Take 1 capsule by mouth daily. 90 capsule 3   torsemide (DEMADEX) 20 MG tablet Take 2 tablets (40 mg total) by mouth 2 (two) times daily for 2 days, THEN 2 tablets (40 mg total) daily. 90 tablet 3   zolpidem (AMBIEN) 5 MG tablet Take 5 mg by mouth at bedtime as needed.     No current facility-administered medications for this visit.   Allergies  Allergen Reactions   Lipitor [Atorvastatin] Other (See Comments)    Body aches   Penicillins Swelling    Swollen face   Penicillin G     Other reaction(s): Unknown   Social History   Socioeconomic History   Marital status: Single    Spouse name: Not on file   Number of children: 2   Years of education: Not on file   Highest education level: Not on file  Occupational History   Occupation: retired  Tobacco Use   Smoking status: Former    Current packs/day: 0.00    Average packs/day: 0.3 packs/day for 10.0 years (2.5 ttl pk-yrs)    Types: Cigarettes    Start date: 11/04/1971    Quit date: 11/03/1981    Years since quitting: 41.6   Smokeless tobacco: Never  Vaping Use   Vaping status: Never Used  Substance and Sexual Activity   Alcohol use: Yes    Comment: occasional, none since December 2023   Drug use: No   Sexual activity: Not on file  Other Topics Concern   Not on file  Social History Narrative    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   Married 1 son 1 daughter   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   Other jobs including working at the [employer], providing  health care for disabled, 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, 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   Social Determinants of Corporate investment banker Strain: Low Risk  (07/06/2022)   Received [facility]   Overall Financial Resource Strain (CARDIA)    Difficulty of Paying Living Expenses: Not hard at all  Food Insecurity: Low Risk  (06/21/2023)   Received from [facility]   Food vital sign    Within the past 12 months, you worried that your food would run out before you got money to buy more: Never true    Within the past 12 months, the food you bought just didn't last and you didn't have money to get more. : Never true  Transportation Needs: No Transportation Needs (06/21/2023)   Received from [facility]    In the past 12 months, has lack of reliable transportation kept you from medical appointments, meetings, work or from getting things needed for daily living? : No  Physical Activity: Not on file  Stress: Not on file  Social Connections: Unknown (03/07/2022)   Received from [facility]   Social Network    Social Network: Not on file  Intimate Partner Violence: Unknown (01/27/2022)   Received from [facility]   HITS    Physically Hurt: Not on file    Insult or Talk Down To: Not on file    Threaten Physical Harm: Not on file    Scream or Curse: Not on file   Family History  Problem Relation Age of Onset   Congenital heart disease Mother    Diabetes Mother    Hypertension Mother    Heart disease Mother    Heart attack Father     Cervical cancer Sister    Diabetes Sister    Hypertension Brother    Diabetes Brother    Pancreatic cancer Brother    Diabetes Brother    Breast cancer Sister    Diabetes Sister    Diabetes Sister    Diabetes Sister    Diabetes Sister    Diabetes Son    Colon cancer Neg Hx    Colon polyps Neg Hx    Esophageal cancer Neg Hx    Stomach cancer Neg Hx    There were no vitals taken for this visit.   Wt Readings from Last 3 Encounters:  06/12/23 167 lb (75.8 kg)  05/04/23 154 lb 3.2 oz (69.9 kg)  02/28/23 158 lb 9.6 oz (71.9 kg)   PHYSICAL EXAM on Video: General:  Thin frail No resp difficulty HEENT: normal Neck: supple. JVPa ppears mildly elevated Carotids 2+ bilat; no bruits. No lymphadenopathy or thryomegaly appreciated. Cor: not examined Lungs: breathing unlabored Abdomen: reports nontender, + visibly distended.  Extremities: no cyanosis, clubbing, rash, edema Neuro: alert & orientedx3, cranial nerves grossly intact. moves all 4 extremities w/o difficulty. Affect pleasant  ASSESSMENT & PLAN:   1. 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 RVSP with question of mild D-shaped septum - R/L cath 5/22: mLAD 90% (patent LIMA to LAD), LCX and RCA minimal CAD.  RA 9 RV 24/0 PA 25/8 (15) PCWP 5 Fick 3.4/1.6CPX 09/07/21 - CPX 11/22: pVO2 10.4 VE/VCO2 slope:  48, pRER: 1.09  - cMRI 09/22/21: EF 40% RVEF 46%  diffuses LGE and markedly elevated ECW ~71%. degenerative appearing Bioprosthetic AVR with mild AI. Reviewed with imaging team and felt to be c/w advanced cardiac amyloidosis. - PYP 1/22 markedly positive. 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 Diuresed 14 pounds - CPX confirms severe HF limitation - Echo 5/23 EF 38% at [facility]  - Now end-stage NYHA III-IV. BP low - Has been off bb for some time.  - Continue Jardiance 10 mg  daily. - Continue spiro 12.5 mg daily.  - Continue midodrine  10 mg twice a day BP stable  - Not candidate for advanced therapies with cachexia and CKD - Long talk with him and his wife about situation. He is end-stage but still has acceptable QoL so would continue tafamadis and Amvuttra to reduce risk of progression. Continue Palliative Care support. If symptoms progress can switch to Comfort Care as needed - Remains DNR/DNI would not want heroic measures in setting of respiratory or cardaic arrest   2. TTR cardiac amyloidosis with polyneuropathy - PYP 1/22 markedly positive. 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   3. CAD - S/p previous stent 2008 (unknown vessel) & LIMA to LAD 2011 - No s/s angina - Continue statin. No ASA with Xarelto  4.  Bicuspid AoV s/p bioprosthetic AVR 2011 - Stable on previous echo. - Aware of need for SBE prophylaxis  5. 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 PAF - s/p AF ablation 2017 - No recurrence - Continue Xarelto. No bleeding   6. OSA - Compliant with CPAP.  - No change.  7. DM2 - Continue Jardiance.  8. CKD3a - Baseline SCr 1.4-1.6 - Continue SGLT2i  9. Orthostatic hypotension - Improved midodrine  10. Junctional rhythm - at high risk for progressive conduction disease with TTR amyloid.   11. Goals of care - Plan as above   Total time spent 45 minutes. Over half that time spent discussing above.    Arvilla Meres, MD  10:50 AM  -

## 2023-06-23 ENCOUNTER — Other Ambulatory Visit (HOSPITAL_COMMUNITY): Payer: Self-pay

## 2023-06-23 ENCOUNTER — Other Ambulatory Visit (HOSPITAL_COMMUNITY): Payer: Self-pay | Admitting: Pharmacist

## 2023-06-23 MED ORDER — AMVUTTRA 25 MG/0.5ML ~~LOC~~ SOSY: 25.0000 mg | PREFILLED_SYRINGE | SUBCUTANEOUS | Status: DC

## 2023-06-27 ENCOUNTER — Other Ambulatory Visit (HOSPITAL_COMMUNITY): Payer: Self-pay

## 2023-06-27 ENCOUNTER — Other Ambulatory Visit: Payer: Self-pay

## 2023-06-27 ENCOUNTER — Ambulatory Visit (HOSPITAL_COMMUNITY)
Admission: RE | Admit: 2023-06-27 | Discharge: 2023-06-27 | Disposition: A | Discharge status: Home / Self Care | Payer: Medicare HMO | Source: Ambulatory Visit | Attending: Nurse Practitioner | Admitting: Nurse Practitioner

## 2023-06-27 DIAGNOSIS — R188 Other ascites: Secondary | ICD-10-CM

## 2023-06-27 HISTORY — PX: IR PARACENTESIS: IMG2679

## 2023-06-27 LAB — BODY FLUID CELL COUNT WITH DIFFERENTIAL
Eos, Fluid: 0 %
Lymphs, Fluid: 51 %
Monocyte-Macrophage-Serous Fluid: 19 % — ABNORMAL LOW (ref 50–90)
Neutrophil Count, Fluid: 30 % — ABNORMAL HIGH (ref 0–25)
Total Nucleated Cell Count, Fluid: 507 uL (ref 0–1000)

## 2023-06-27 LAB — ALBUMIN, PLEURAL OR PERITONEAL FLUID: Albumin, Fluid: 2.1 g/dL

## 2023-06-27 LAB — PROTEIN, PLEURAL OR PERITONEAL FLUID: Total protein, fluid: 4.7 g/dL

## 2023-06-27 MED ORDER — LIDOCAINE HCL 1 % IJ SOLN
INTRAMUSCULAR | Status: AC
  Filled 2023-06-27: qty 20

## 2023-06-27 NOTE — Procedures (Signed)
PROCEDURE SUMMARY:  Successful image-guided paracentesis from the right lower abdomen.  Yielded 5.2 liters of clear yellow fluid.  No immediate complications.  EBL = trace. Patient tolerated well.   Specimen was sent for labs.  Please see imaging section of Epic for full dictation.   Kennieth Francois PA-C 06/27/2023 1:37 PM

## 2023-06-28 ENCOUNTER — Other Ambulatory Visit (HOSPITAL_COMMUNITY): Payer: Self-pay | Admitting: Nurse Practitioner

## 2023-06-28 DIAGNOSIS — R188 Other ascites: Secondary | ICD-10-CM

## 2023-06-28 LAB — PATHOLOGIST SMEAR REVIEW

## 2023-06-29 ENCOUNTER — Other Ambulatory Visit (HOSPITAL_COMMUNITY): Payer: Self-pay

## 2023-06-29 ENCOUNTER — Ambulatory Visit (HOSPITAL_COMMUNITY)
Admission: RE | Admit: 2023-06-29 | Discharge: 2023-06-29 | Disposition: A | Discharge status: Home / Self Care | Payer: Medicare HMO | Source: Ambulatory Visit | Attending: Cardiology | Admitting: Cardiology

## 2023-06-29 DIAGNOSIS — I5022 Chronic systolic (congestive) heart failure: Secondary | ICD-10-CM | POA: Diagnosis present

## 2023-06-29 DIAGNOSIS — Z66 Do not resuscitate: Secondary | ICD-10-CM | POA: Insufficient documentation

## 2023-06-29 DIAGNOSIS — G4733 Obstructive sleep apnea (adult) (pediatric): Secondary | ICD-10-CM | POA: Diagnosis not present

## 2023-06-29 DIAGNOSIS — N189 Chronic kidney disease, unspecified: Secondary | ICD-10-CM | POA: Insufficient documentation

## 2023-06-29 DIAGNOSIS — Z951 Presence of aortocoronary bypass graft: Secondary | ICD-10-CM | POA: Diagnosis not present

## 2023-06-29 DIAGNOSIS — E854 Organ-limited amyloidosis: Secondary | ICD-10-CM | POA: Insufficient documentation

## 2023-06-29 DIAGNOSIS — E1122 Type 2 diabetes mellitus with diabetic chronic kidney disease: Secondary | ICD-10-CM | POA: Insufficient documentation

## 2023-06-29 DIAGNOSIS — R64 Cachexia: Secondary | ICD-10-CM | POA: Diagnosis not present

## 2023-06-29 DIAGNOSIS — Z954 Presence of other heart-valve replacement: Secondary | ICD-10-CM | POA: Diagnosis not present

## 2023-06-29 DIAGNOSIS — I2510 Atherosclerotic heart disease of native coronary artery without angina pectoris: Secondary | ICD-10-CM | POA: Diagnosis not present

## 2023-06-29 DIAGNOSIS — E852 Heredofamilial amyloidosis, unspecified: Secondary | ICD-10-CM | POA: Diagnosis not present

## 2023-06-29 DIAGNOSIS — I4891 Unspecified atrial fibrillation: Secondary | ICD-10-CM | POA: Diagnosis not present

## 2023-06-29 DIAGNOSIS — I110 Hypertensive heart disease with heart failure: Secondary | ICD-10-CM | POA: Diagnosis not present

## 2023-06-29 DIAGNOSIS — I43 Cardiomyopathy in diseases classified elsewhere: Secondary | ICD-10-CM | POA: Diagnosis not present

## 2023-06-29 MED ORDER — VUTRISIRAN SODIUM 25 MG/0.5ML ~~LOC~~ SOSY
25.0000 mg | PREFILLED_SYRINGE | Freq: Once | SUBCUTANEOUS | Status: AC
  Administered 2023-06-29: 25 mg via SUBCUTANEOUS
  Filled 2023-06-29: qty 0.5

## 2023-06-29 MED ORDER — VITAMIN A 2400 MCG (8000 UT) PO CAPS: 1.0000 | ORAL_CAPSULE | Freq: Every day | ORAL | Status: DC

## 2023-06-29 NOTE — Progress Notes (Signed)
Advanced Heart Failure Clinic Note   Primary Care: Adrian Prince, MD Primary Cardiologist: Lance Muss, MD HF Cardiologist: Dr. Gala Romney [facility]: Dr Cherly Hensen  HPI:  Frank Berg is a 67 y.o. male referred by Dr. Eldridge Dace for further evaluation of his HF.    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 Also has h/o AF s/p AF ablation at [facility] in 2017. CAD s/p previous stent (2008) followed by LIMA to LAD at time of AVR. TTR Amyloid/    Admitted in 4/22 with near syncope. EF 45% by echo. RV mildly HK. RVSP    R/L cath 5/22: mLAD 90% (patent LIMA to LAD), LCX and RCA minimal CAD.  RA 9 RV 24/0 PA 25/8 (15) PCWP 5 Fick 3.4/1.6   We saw him for the first time on 08/23/21 for AHF consult. W/u revealed severe TTR cardiac amyloidosis. Started on tafamadis. CPX with marked HF limitation.  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 Diuresed 14 pounds. D/c weight 171 lbs. Started on torsemide 40 mg but hadn't taken it yet.   Echo 12/17/21: EF 45-50% severe LVH RV mod HK.   Follow up 12/2021, volume increasing, torsemide 40 mg daily started.    Transferred his care to [facility]. Started on Amvuttra in 01/2022 by Neurology   Then moved to CT and was seen at [facility]. CPX test in 03/2022 showed pVO2 9.8 but apparently didn't reach AT so felt to be submax test. No RER or VE/VCO2 slope reported. Got dose of Amvuttra on 08/23/22 at [place]. Now moved back to [facility]  Saw Dr. Gala Romney on 06/22/23 at the request of Palliative Care. Said he was feeling ok. Functional capacity limited but able to do ADLs. Scheduled for paracentesis. Decision was made to resume Vyndamax and Amvuttra as patient still had acceptable QoL. Had previously been held as patient was noted to be end stage.   Today he returns to HF clinic for Amvuttra administration. Patient doing well today. No contraindications to injection. Injection administered in left arm.    .  Assessment/Plan: 1. 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 RVSP with question of mild D-shaped septum - R/L cath 02/2021: mLAD 90% (patent LIMA to LAD), LCX and RCA minimal CAD.  RA 9 RV 24/0 PA 25/8 (15) PCWP 5 Fick 3.4/1.6CPX 09/07/21 - CPX 08/2021: pVO2 10.4 VE/VCO2 slope:  48, pRER: 1.09  - cMRI 09/22/21: EF 40% RVEF 46%  diffuses LGE and markedly elevated ECW ~71%. degenerative appearing Bioprosthetic AVR with mild AI. Reviewed with imaging team and felt to be c/w advanced cardiac amyloidosis. - PYP 10/2020 markedly positive. 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 Diuresed 14 pounds - CPX confirms severe HF limitation - Echo 02/2022 EF 38% at [facility]  - Now end-stage NYHA III-IV.  - Has been off bb for some time.  - Continue Jardiance 10 mg daily. - Continue spironolactone 12.5 mg daily.  - Continue midodrine 10 mg twice a day - Not candidate for advanced therapies with cachexia and CKD -Long discussion with Dr. Gala Romney last visit regarding his situation. He is end-stage but still has acceptable QoL so Dr. Gala Romney recommended continuing tafamadis and Amvuttra to reduce risk of progression. Continue Palliative Care support. If symptoms  progress can switch to Comfort Care as needed - Remains DNR/DNI would not want heroic measures in setting of respiratory or cardaic arrest    2. 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 Patient tolerated injection well. Provided patient counseling on Amvuttra. Most common side effects are injection site reactions, arthralgias and dyspnea. Patient is aware to return to clinic every 3 months for repeat  injection.  - Restart vitamin A supplement 8000 IU daily. Amvuttra decreases serum vitamin A levels.   Follow up 3 months for repeat injection.   Karle Plumber, PharmD, BCPS, BCCP, CPP Heart Failure Clinic Pharmacist [phone]

## 2023-07-07 ENCOUNTER — Telehealth (HOSPITAL_COMMUNITY): Payer: Self-pay

## 2023-07-07 NOTE — Telephone Encounter (Signed)
Patient wants to know if he can have an order to start receiving in home physical therapy. Please advise.    CB# [phone] (Will, RN with [facility])

## 2023-07-09 ENCOUNTER — Ambulatory Visit (INDEPENDENT_AMBULATORY_CARE_PROVIDER_SITE_OTHER): Payer: Medicare HMO | Admitting: Neurology

## 2023-07-09 DIAGNOSIS — I509 Heart failure, unspecified: Secondary | ICD-10-CM

## 2023-07-09 DIAGNOSIS — E854 Organ-limited amyloidosis: Secondary | ICD-10-CM

## 2023-07-09 DIAGNOSIS — I4891 Unspecified atrial fibrillation: Secondary | ICD-10-CM

## 2023-07-09 DIAGNOSIS — J9601 Acute respiratory failure with hypoxia: Secondary | ICD-10-CM

## 2023-07-09 DIAGNOSIS — G4733 Obstructive sleep apnea (adult) (pediatric): Secondary | ICD-10-CM | POA: Diagnosis not present

## 2023-07-09 DIAGNOSIS — I5021 Acute systolic (congestive) heart failure: Secondary | ICD-10-CM

## 2023-07-10 NOTE — Telephone Encounter (Signed)
Frank Berg advised.

## 2023-07-13 ENCOUNTER — Other Ambulatory Visit (HOSPITAL_COMMUNITY): Payer: Self-pay

## 2023-07-14 ENCOUNTER — Other Ambulatory Visit: Payer: Self-pay

## 2023-07-14 ENCOUNTER — Emergency Department (HOSPITAL_COMMUNITY): Payer: Medicare HMO

## 2023-07-14 ENCOUNTER — Ambulatory Visit (HOSPITAL_COMMUNITY)
Admission: RE | Admit: 2023-07-14 | Discharge: 2023-07-14 | Disposition: A | Discharge status: Home / Self Care | Payer: Medicare HMO | Source: Ambulatory Visit | Attending: Nurse Practitioner

## 2023-07-14 ENCOUNTER — Inpatient Hospital Stay (HOSPITAL_COMMUNITY)
Admission: EM | Admit: 2023-07-14 | Discharge: 2023-07-17 | DRG: 433 | Disposition: A | Discharge status: Home / Self Care | Payer: Medicare HMO | Attending: Internal Medicine | Admitting: Internal Medicine

## 2023-07-14 DIAGNOSIS — Z7901 Long term (current) use of anticoagulants: Secondary | ICD-10-CM

## 2023-07-14 DIAGNOSIS — D6832 Hemorrhagic disorder due to extrinsic circulating anticoagulants: Secondary | ICD-10-CM | POA: Diagnosis present

## 2023-07-14 DIAGNOSIS — K661 Hemoperitoneum: Secondary | ICD-10-CM | POA: Diagnosis not present

## 2023-07-14 DIAGNOSIS — K219 Gastro-esophageal reflux disease without esophagitis: Secondary | ICD-10-CM | POA: Diagnosis present

## 2023-07-14 DIAGNOSIS — I10 Essential (primary) hypertension: Secondary | ICD-10-CM | POA: Diagnosis present

## 2023-07-14 DIAGNOSIS — N401 Enlarged prostate with lower urinary tract symptoms: Secondary | ICD-10-CM | POA: Diagnosis present

## 2023-07-14 DIAGNOSIS — Z833 Family history of diabetes mellitus: Secondary | ICD-10-CM

## 2023-07-14 DIAGNOSIS — I482 Chronic atrial fibrillation, unspecified: Secondary | ICD-10-CM | POA: Diagnosis present

## 2023-07-14 DIAGNOSIS — D62 Acute posthemorrhagic anemia: Secondary | ICD-10-CM | POA: Diagnosis present

## 2023-07-14 DIAGNOSIS — E854 Organ-limited amyloidosis: Secondary | ICD-10-CM | POA: Diagnosis present

## 2023-07-14 DIAGNOSIS — R791 Abnormal coagulation profile: Secondary | ICD-10-CM | POA: Diagnosis present

## 2023-07-14 DIAGNOSIS — I9589 Other hypotension: Secondary | ICD-10-CM | POA: Diagnosis present

## 2023-07-14 DIAGNOSIS — R188 Other ascites: Secondary | ICD-10-CM | POA: Insufficient documentation

## 2023-07-14 DIAGNOSIS — Z87891 Personal history of nicotine dependence: Secondary | ICD-10-CM

## 2023-07-14 DIAGNOSIS — E1142 Type 2 diabetes mellitus with diabetic polyneuropathy: Secondary | ICD-10-CM | POA: Diagnosis present

## 2023-07-14 DIAGNOSIS — I5020 Unspecified systolic (congestive) heart failure: Secondary | ICD-10-CM

## 2023-07-14 DIAGNOSIS — T148XXA Other injury of unspecified body region, initial encounter: Secondary | ICD-10-CM

## 2023-07-14 DIAGNOSIS — K7460 Unspecified cirrhosis of liver: Principal | ICD-10-CM

## 2023-07-14 DIAGNOSIS — I129 Hypertensive chronic kidney disease with stage 1 through stage 4 chronic kidney disease, or unspecified chronic kidney disease: Secondary | ICD-10-CM | POA: Diagnosis present

## 2023-07-14 DIAGNOSIS — K922 Gastrointestinal hemorrhage, unspecified: Secondary | ICD-10-CM | POA: Diagnosis not present

## 2023-07-14 DIAGNOSIS — I959 Hypotension, unspecified: Principal | ICD-10-CM

## 2023-07-14 DIAGNOSIS — G4733 Obstructive sleep apnea (adult) (pediatric): Secondary | ICD-10-CM | POA: Diagnosis present

## 2023-07-14 DIAGNOSIS — I480 Paroxysmal atrial fibrillation: Secondary | ICD-10-CM | POA: Diagnosis not present

## 2023-07-14 DIAGNOSIS — E871 Hypo-osmolality and hyponatremia: Secondary | ICD-10-CM | POA: Diagnosis present

## 2023-07-14 DIAGNOSIS — S301XXA Contusion of abdominal wall, initial encounter: Secondary | ICD-10-CM | POA: Diagnosis present

## 2023-07-14 DIAGNOSIS — R571 Hypovolemic shock: Secondary | ICD-10-CM | POA: Diagnosis not present

## 2023-07-14 DIAGNOSIS — I4891 Unspecified atrial fibrillation: Secondary | ICD-10-CM

## 2023-07-14 DIAGNOSIS — Z66 Do not resuscitate: Secondary | ICD-10-CM | POA: Diagnosis present

## 2023-07-14 DIAGNOSIS — K7210 Chronic hepatic failure without coma: Secondary | ICD-10-CM | POA: Diagnosis present

## 2023-07-14 DIAGNOSIS — Z515 Encounter for palliative care: Secondary | ICD-10-CM

## 2023-07-14 DIAGNOSIS — X58XXXA Exposure to other specified factors, initial encounter: Secondary | ICD-10-CM | POA: Diagnosis present

## 2023-07-14 DIAGNOSIS — E1122 Type 2 diabetes mellitus with diabetic chronic kidney disease: Secondary | ICD-10-CM | POA: Diagnosis present

## 2023-07-14 DIAGNOSIS — E119 Type 2 diabetes mellitus without complications: Secondary | ICD-10-CM

## 2023-07-14 DIAGNOSIS — Z953 Presence of xenogenic heart valve: Secondary | ICD-10-CM | POA: Diagnosis not present

## 2023-07-14 DIAGNOSIS — E785 Hyperlipidemia, unspecified: Secondary | ICD-10-CM | POA: Diagnosis present

## 2023-07-14 DIAGNOSIS — Z79899 Other long term (current) drug therapy: Secondary | ICD-10-CM

## 2023-07-14 DIAGNOSIS — Z880 Allergy status to penicillin: Secondary | ICD-10-CM

## 2023-07-14 DIAGNOSIS — Z951 Presence of aortocoronary bypass graft: Secondary | ICD-10-CM

## 2023-07-14 DIAGNOSIS — Z7984 Long term (current) use of oral hypoglycemic drugs: Secondary | ICD-10-CM

## 2023-07-14 DIAGNOSIS — I43 Cardiomyopathy in diseases classified elsewhere: Secondary | ICD-10-CM | POA: Diagnosis present

## 2023-07-14 DIAGNOSIS — M109 Gout, unspecified: Secondary | ICD-10-CM | POA: Diagnosis present

## 2023-07-14 DIAGNOSIS — I2510 Atherosclerotic heart disease of native coronary artery without angina pectoris: Secondary | ICD-10-CM | POA: Diagnosis present

## 2023-07-14 DIAGNOSIS — K769 Liver disease, unspecified: Secondary | ICD-10-CM | POA: Diagnosis not present

## 2023-07-14 DIAGNOSIS — Z8249 Family history of ischemic heart disease and other diseases of the circulatory system: Secondary | ICD-10-CM

## 2023-07-14 DIAGNOSIS — N1832 Chronic kidney disease, stage 3b: Secondary | ICD-10-CM | POA: Diagnosis present

## 2023-07-14 DIAGNOSIS — R338 Other retention of urine: Secondary | ICD-10-CM | POA: Diagnosis present

## 2023-07-14 DIAGNOSIS — E852 Heredofamilial amyloidosis, unspecified: Secondary | ICD-10-CM | POA: Diagnosis present

## 2023-07-14 DIAGNOSIS — I255 Ischemic cardiomyopathy: Secondary | ICD-10-CM

## 2023-07-14 DIAGNOSIS — D649 Anemia, unspecified: Secondary | ICD-10-CM | POA: Diagnosis not present

## 2023-07-14 DIAGNOSIS — K921 Melena: Secondary | ICD-10-CM

## 2023-07-14 DIAGNOSIS — Z888 Allergy status to other drugs, medicaments and biological substances status: Secondary | ICD-10-CM

## 2023-07-14 HISTORY — PX: IR PARACENTESIS: IMG2679

## 2023-07-14 LAB — COMPREHENSIVE METABOLIC PANEL
ALT: 11 U/L (ref 0–44)
AST: 43 U/L — ABNORMAL HIGH (ref 15–41)
Albumin: 2.7 g/dL — ABNORMAL LOW (ref 3.5–5.0)
Alkaline Phosphatase: 121 U/L (ref 38–126)
Anion gap: 7 (ref 5–15)
BUN: 48 mg/dL — ABNORMAL HIGH (ref 8–23)
CO2: 24 mmol/L (ref 22–32)
Calcium: 8.2 mg/dL — ABNORMAL LOW (ref 8.9–10.3)
Chloride: 97 mmol/L — ABNORMAL LOW (ref 98–111)
Creatinine, Ser: 2.23 mg/dL — ABNORMAL HIGH (ref 0.61–1.24)
GFR, Estimated: 31 mL/min — ABNORMAL LOW (ref >60)
Glucose, Bld: 109 mg/dL — ABNORMAL HIGH (ref 70–99)
Potassium: 4.3 mmol/L (ref 3.5–5.1)
Sodium: 128 mmol/L — ABNORMAL LOW (ref 135–145)
Total Bilirubin: 1.5 mg/dL — ABNORMAL HIGH (ref 0.3–1.2)
Total Protein: 7.5 g/dL (ref 6.5–8.1)

## 2023-07-14 LAB — CBC
HCT: 29.3 % — ABNORMAL LOW (ref 39.0–52.0)
Hemoglobin: 9.5 g/dL — ABNORMAL LOW (ref 13.0–17.0)
MCH: 29.1 pg (ref 26.0–34.0)
MCHC: 32.4 g/dL (ref 30.0–36.0)
MCV: 89.9 fL (ref 80.0–100.0)
Platelets: 339 10*3/uL (ref 150–400)
RBC: 3.26 MIL/uL — ABNORMAL LOW (ref 4.22–5.81)
RDW: 18.7 % — ABNORMAL HIGH (ref 11.5–15.5)
WBC: 4.5 10*3/uL (ref 4.0–10.5)
nRBC: 0 % (ref 0.0–0.2)

## 2023-07-14 LAB — POC OCCULT BLOOD, ED: Fecal Occult Bld: POSITIVE — AB

## 2023-07-14 LAB — PREPARE RBC (CROSSMATCH)

## 2023-07-14 LAB — ABO/RH: ABO/RH(D): B POS

## 2023-07-14 LAB — CBG MONITORING, ED: Glucose-Capillary: 78 mg/dL (ref 70–99)

## 2023-07-14 LAB — I-STAT CG4 LACTIC ACID, ED: Lactic Acid, Venous: 1.5 mmol/L (ref 0.5–1.9)

## 2023-07-14 LAB — PROTIME-INR
INR: 2.1 — ABNORMAL HIGH (ref 0.8–1.2)
Prothrombin Time: 23.8 seconds — ABNORMAL HIGH (ref 11.4–15.2)

## 2023-07-14 MED ORDER — MIRTAZAPINE 15 MG PO TABS
15.0000 mg | ORAL_TABLET | Freq: Every day | ORAL | Status: DC
  Administered 2023-07-15 – 2023-07-16 (×3): 15 mg via ORAL
  Filled 2023-07-14 (×3): qty 1

## 2023-07-14 MED ORDER — TAMSULOSIN HCL 0.4 MG PO CAPS
0.4000 mg | ORAL_CAPSULE | Freq: Every day | ORAL | Status: DC
  Administered 2023-07-14 – 2023-07-16 (×3): 0.4 mg via ORAL
  Filled 2023-07-14 (×3): qty 1

## 2023-07-14 MED ORDER — SODIUM CHLORIDE 0.9% IV SOLUTION: Freq: Once | INTRAVENOUS | Status: AC

## 2023-07-14 MED ORDER — OCTREOTIDE LOAD VIA INFUSION
50.0000 ug | Freq: Once | INTRAVENOUS | Status: AC
  Administered 2023-07-14: 50 ug via INTRAVENOUS
  Filled 2023-07-14: qty 25

## 2023-07-14 MED ORDER — SODIUM CHLORIDE 0.9 % IV BOLUS
1000.0000 mL | Freq: Once | INTRAVENOUS | Status: AC
  Administered 2023-07-14: 1000 mL via INTRAVENOUS

## 2023-07-14 MED ORDER — OCTREOTIDE LOAD VIA INFUSION: 50.0000 ug | Freq: Once | INTRAVENOUS | Status: DC

## 2023-07-14 MED ORDER — SODIUM CHLORIDE 0.9 % IV SOLN
1.0000 g | Freq: Once | INTRAVENOUS | Status: AC
  Administered 2023-07-14: 1 g via INTRAVENOUS
  Filled 2023-07-14: qty 10

## 2023-07-14 MED ORDER — LIDOCAINE HCL 1 % IJ SOLN
INTRAMUSCULAR | Status: AC
  Filled 2023-07-14: qty 20

## 2023-07-14 MED ORDER — INSULIN ASPART 100 UNIT/ML IJ SOLN
0.0000 [IU] | INTRAMUSCULAR | Status: DC
  Administered 2023-07-15 (×2): 2 [IU] via SUBCUTANEOUS

## 2023-07-14 MED ORDER — LIDOCAINE HCL (PF) 1 % IJ SOLN
20.0000 mL | Freq: Once | INTRAMUSCULAR | Status: AC
  Administered 2023-07-14: 8 mL via INTRADERMAL

## 2023-07-14 MED ORDER — MIDODRINE HCL 5 MG PO TABS
10.0000 mg | ORAL_TABLET | Freq: Two times a day (BID) | ORAL | Status: DC
  Administered 2023-07-14 – 2023-07-17 (×7): 10 mg via ORAL
  Filled 2023-07-14 (×7): qty 2

## 2023-07-14 MED ORDER — PANTOPRAZOLE SODIUM 40 MG IV SOLR
40.0000 mg | Freq: Once | INTRAVENOUS | Status: AC
  Administered 2023-07-14: 40 mg via INTRAVENOUS
  Filled 2023-07-14: qty 10

## 2023-07-14 MED ORDER — IOHEXOL 350 MG/ML SOLN
80.0000 mL | Freq: Once | INTRAVENOUS | Status: AC | PRN
  Administered 2023-07-14: 80 mL via INTRAVENOUS

## 2023-07-14 MED ORDER — DEXTROSE-SODIUM CHLORIDE 5-0.9 % IV SOLN: INTRAVENOUS | Status: DC

## 2023-07-14 MED ORDER — ALBUMIN HUMAN 25 % IV SOLN
20.0000 g | Freq: Once | INTRAVENOUS | Status: AC
  Administered 2023-07-14: 18.75 g via INTRAVENOUS
  Filled 2023-07-14: qty 100

## 2023-07-14 MED ORDER — SODIUM CHLORIDE 0.9 % IV SOLN
1.0000 g | INTRAVENOUS | Status: DC
  Administered 2023-07-15 – 2023-07-16 (×2): 1 g via INTRAVENOUS
  Filled 2023-07-14 (×2): qty 10

## 2023-07-14 MED ORDER — PANTOPRAZOLE SODIUM 40 MG IV SOLR
40.0000 mg | Freq: Two times a day (BID) | INTRAVENOUS | Status: DC
  Administered 2023-07-15 – 2023-07-17 (×6): 40 mg via INTRAVENOUS
  Filled 2023-07-14 (×6): qty 10

## 2023-07-14 MED ORDER — ALLOPURINOL 300 MG PO TABS
300.0000 mg | ORAL_TABLET | Freq: Every day | ORAL | Status: DC
  Administered 2023-07-15 – 2023-07-17 (×3): 300 mg via ORAL
  Filled 2023-07-14 (×3): qty 1

## 2023-07-14 MED ORDER — SODIUM CHLORIDE 0.9 % IV SOLN
50.0000 ug/h | INTRAVENOUS | Status: DC
  Administered 2023-07-14 – 2023-07-16 (×4): 50 ug/h via INTRAVENOUS
  Filled 2023-07-14 (×4): qty 1

## 2023-07-14 NOTE — H&P (Addendum)
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  Today patient went for a routine paracentesis.  Fluid collected was serosanguineous in appearance.  Patient contacted his PCP and was told to go to the ER.  Per patient for the past 4 days he has had black stools.  He endorses being more frequently lightheaded and dizzy over the past week or so, resulting his occasionally finding himself holding onto the wall to steady himself.  He denies any falls.  Patient has no prior history of GI bleed.  He does not have heartburn, eating spicy foods or NSAID use.  In the ER patient's BPs are borderline, SBP in the 90's.  Vitals otherwise normal.  Hemoglobin 9.5, baseline 12 (05/04/2023).  Heme positive.  Anemia panel, PRBC transfusion ordered.  GI consult placed.  IV Rocephin given.  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   HLD (hyperlipidemia)    Hx of adenomatous polyp of colon 12/03/2020   Hypertension    Indwelling urinary catheter present 04/2023   OSA on CPAP    Sleep apnea    Past Surgical History:  Procedure Laterality Date   ABLATION     AORTIC VALVE REPLACEMENT  2011   CERVICAL DISC SURGERY     spinal stenosis   COLONOSCOPY     ESOPHAGOGASTRODUODENOSCOPY     IR PARACENTESIS  05/12/2023   IR PARACENTESIS  06/27/2023   IR PARACENTESIS  07/14/2023   RIGHT/LEFT HEART CATH AND  CORONARY/GRAFT ANGIOGRAPHY N/A 03/18/2021   Procedure: RIGHT/LEFT HEART CATH AND CORONARY/GRAFT ANGIOGRAPHY;  Surgeon: Corky Crafts, MD;  Location: [facility];  Service: Cardiovascular;  Laterality: N/A;   TONSILLECTOMY      Medications: Prior to Admission medications   Medication Sig Start Date End Date Taking? Authorizing Provider  allopurinol (ZYLOPRIM) 300 MG tablet Take 300 mg by mouth daily. 11/23/19  Yes [provider]  colchicine 0.6 MG tablet Take 0.6 mg by mouth as needed. 02/14/23  Yes [provider]  empagliflozin (JARDIANCE) 10 MG TABS tablet take 1 tablet by mouth daily before breakfast 02/14/23  Yes Bensimhon, Bevelyn Buckles, MD  Ensure Max Protein (ENSURE MAX PROTEIN) LIQD Take 330 mLs (11 oz total) by mouth at bedtime. 12/20/21  Yes Sheikh, Omair Latif, DO  ferrous sulfate 325 (65 FE) MG tablet Take 1 tablet (325 mg total) by mouth daily with breakfast. 03/09/23  Yes [name], MD  loratadine (CLARITIN) 10 MG tablet Take 10 mg by mouth daily as needed for allergies.   Yes [provider]  midodrine (PROAMATINE) 10 MG tablet Take 1 tablet (10 mg total) by mouth 2 (two) times daily with a meal. 05/04/23  Yes Bensimhon, Bevelyn Buckles, MD  mirtazapine (REMERON) 15 MG tablet Take 15 mg by mouth at bedtime.   Yes [provider]  Multiple Vitamins-Minerals (CERTAVITE/ANTIOXIDANTS) TABS Take 1 tablet by mouth daily. 12/21/21  Yes  Sheikh, Omair Latif, DO  Rivaroxaban (XARELTO) 15 MG TABS tablet Take 15 mg by mouth daily with supper.   Yes [provider]  rosuvastatin (CRESTOR) 5 MG tablet Take 5 mg by mouth every evening. 08/26/16  Yes [provider]  tadalafil, PAH, (ADCIRCA) 20 MG tablet Take 20 mg by mouth daily as needed (ED). 05/06/21  Yes [provider]  Tafamidis (VYNDAMAX) 61 MG CAPS Take 1 capsule by mouth daily. 09/08/22  Yes Bensimhon, Bevelyn Buckles, MD  tamsulosin (FLOMAX) 0.4 MG CAPS capsule Take 0.4 mg by  mouth at bedtime. 06/09/23  Yes [provider]  torsemide (DEMADEX) 20 MG tablet Take 2 tablets (40 mg total) by mouth 2 (two) times daily for 2 days, THEN 2 tablets (40 mg total) daily. Patient taking differently:  2 tablets (40 mg total) daily. 06/02/23 07/14/23 Yes Bensimhon, Bevelyn Buckles, MD  Vitamin A 2400 MCG (8000 UT) CAPS Take 1 capsule by mouth daily. 06/29/23  Yes Bensimhon, Bevelyn Buckles, MD  vutrisiran sodium (AMVUTTRA) 25 MG/0.5ML syringe Inject 0.5 mLs (25 mg total) into the skin every 3 (three) months. 06/23/23  Yes Bensimhon, Bevelyn Buckles, MD  zolpidem (AMBIEN) 5 MG tablet Take 5 mg by mouth at bedtime as needed. 02/13/23  Yes [provider]  glucose blood (ONETOUCH VERIO) test strip TEST ONCE A DAY DX E11.29    [provider]  metFORMIN (GLUCOPHAGE) 1000 MG tablet Take 1,000 mg by mouth 2 (two) times daily. Patient not taking: Reported on 07/14/2023 03/27/23   [provider]    Allergies:   Allergies  Allergen Reactions   Lipitor [Atorvastatin] Other (See Comments)    Body aches   Penicillins Swelling    Swollen face   Penicillin G     Other reaction(s): Unknown    Social History:  reports that he quit smoking about 41 years ago. His smoking use included cigarettes. He started smoking about 51 years ago. He has a 2.5 pack-year smoking history. He has never used smokeless tobacco. He reports current alcohol use. He reports that he does not use drugs.  Family History: Family History  Problem Relation Age of Onset   Congenital heart disease Mother    Diabetes Mother    Hypertension Mother    Heart disease Mother    Heart attack Father    Cervical cancer Sister    Diabetes Sister    Hypertension Brother    Diabetes Brother    Pancreatic cancer Brother    Diabetes Brother    Breast cancer Sister    Diabetes Sister    Diabetes Sister    Diabetes Sister    Diabetes Sister    Diabetes Son    Colon cancer Neg Hx    Colon polyps Neg Hx     Esophageal cancer Neg Hx    Stomach cancer Neg Hx     Physical Exam: Vitals:   07/14/23 1945 07/14/23 2000 07/14/23 2015 07/14/23 2030  BP: 98/75 98/74 101/82 102/77  Pulse: 71 69 72 69  Resp: 14 13 15 18   Temp:      TempSrc:      SpO2: 100% 100% 100% 100%  Weight:      Height:        General: A and O x 3, older, weak/frail, malnourished appearing gentleman in no acute distress Eyes: Pink conjunctiva, no scleral icterus ENT: Moist oral mucosa, neck supple, no thyromegaly Lungs: CTA B/L, no wheeze, no crackles, no use of accessory  muscles Cardiovascular: RRR, no regurgitation, no gallops, no murmurs. No carotid bruits, ++ JVD Abdomen: soft, positive BS, NTND, no organomegaly, not an acute abdomen GU: not examined Neuro: CN II - XII grossly intact, sensation intact Musculoskeletal: strength 5/5 all extremities, 2+ B/L LE edema Skin: no rash, no subcutaneous crepitation, no decubitus Psych: appropriate patient  Labs on Admission:  Recent Labs    07/14/23 1621  NA 128*  K 4.3  CL 97*  CO2 24  GLUCOSE 109*  BUN 48*  CREATININE 2.23*  CALCIUM 8.2*   Recent Labs    07/14/23 1621  AST 43*  ALT 11  ALKPHOS 121  BILITOT 1.5*  PROT 7.5  ALBUMIN 2.7*    Recent Labs    07/14/23 1621  WBC 4.5  HGB 9.5*  HCT 29.3*  MCV 89.9  PLT 339    Radiological Exams on Admission: CT Angio Abd/Pel W and/or Wo Contrast  Result Date: 07/14/2023 CLINICAL DATA:  Melena EXAM: CTA ABDOMEN AND PELVIS WITHOUT AND WITH CONTRAST TECHNIQUE: Multidetector CT imaging of the abdomen and pelvis was performed using the standard protocol during bolus administration of intravenous contrast. Multiplanar reconstructed images and MIPs were obtained and reviewed to evaluate the vascular anatomy. RADIATION DOSE REDUCTION: This exam was performed according to the departmental dose-optimization program which includes automated exposure control, adjustment of the mA and/or kV according to patient size  and/or use of iterative reconstruction technique. CONTRAST:  80mL OMNIPAQUE IOHEXOL 350 MG/ML SOLN COMPARISON:  MR abdomen dated 01/21/2023 FINDINGS: VASCULAR Aorta: Normal caliber aorta without aneurysm, dissection, vasculitis or significant stenosis. Celiac and SMA: Common origin of the celiac and SMA. Patent without evidence of aneurysm, dissection, vasculitis or significant stenosis. Renals: 1 right and 2 left renal arteries. Bilateral renal arteries are patent without evidence of aneurysm, dissection, vasculitis, fibromuscular dysplasia or significant stenosis. IMA: Severe narrowing of the IMA origin to calcified plaque due. No evidence of aneurysm, dissection, or vasculitis. Inflow: Segmental mild-to-moderate narrowing due to calcified atherosclerotic plaque. No evidence of aneurysm, dissection, or vasculitis. Proximal Outflow: Bilateral common femoral and visualized portions of the superficial and profunda femoral arteries are patent without evidence of aneurysm, dissection, vasculitis or significant stenosis. Veins: No obvious venous abnormality within the limitations of this arterial phase study. Review of the MIP images confirms the above findings. NON-VASCULAR Lower chest: Peripheral right lower lobe scarring. Trace right pleural effusion. Multichamber cardiomegaly. Coronary artery calcifications. Prior aortic valve replacement. Reflux of contrast material into the hepatic veins, suggesting a degree of right heart dysfunction. Hepatobiliary: Cirrhotic morphology. Ill-defined hypoattenuation measuring 1.1 cm in segment 2/3 (9:31). No intra or extrahepatic biliary ductal dilation. Normal gallbladder. Pancreas: No focal lesions or main ductal dilation. Spleen: Normal in size without focal abnormality. Adrenals/Urinary Tract: No adrenal nodules. No suspicious renal mass, calculi or hydronephrosis. Urinary bladder is underdistended with catheter in-situ. Stomach/Bowel: Normal appearance of the stomach. No  evidence of bowel wall thickening, distention, or inflammatory changes. Appendix is not discretely seen. Lymphatic: No enlarged abdominal or pelvic lymph nodes. Reproductive: Enlargement of the prostate with median lobe hypertrophy. Other: Small volume ascites.  No free air or fluid collection. Musculoskeletal: No acute or abnormal lytic or blastic osseous lesions. Multilevel degenerative changes of the partially imaged thoracic and lumbar spine. Partially imaged median sternotomy wires are nondisplaced. Mild body wall edema. Asymmetrically enlarged left rectus abdominis muscle with a focus of active contrast extravasation (14:93). IMPRESSION: 1. No findings of active GI bleed. 2. Asymmetrically enlarged left rectus abdominis  muscle with a focus of active contrast extravasation, consistent with intramuscular hematoma. 3. Cirrhotic morphology of the liver with ill-defined hypoattenuation measuring 1.1 cm in segment 2/3, which is indeterminate. Recommend nonemergent liver protocol MRI for further evaluation. 4. Small volume ascites. 5. Trace right pleural effusion. 6. Multichamber cardiomegaly with reflux of contrast material into the hepatic veins, suggesting a degree of right heart dysfunction. 7. Aortic Atherosclerosis (ICD10-I70.0). Coronary artery calcifications. Assessment for potential risk factor modification, dietary therapy or pharmacologic therapy may be warranted, if clinically indicated. Electronically Signed   By: Agustin Cree M.D.   On: 07/14/2023 20:34   IR Paracentesis  Result Date: 07/14/2023 INDICATION: 67 year old male. History of CHF with recurrent ascites. Request is for therapeutic paracentesis EXAM: ULTRASOUND GUIDED THERAPEUTIC LEFT-SIDED PARACENTESIS MEDICATIONS: lidocaine 1% 10 mL COMPLICATIONS: None immediate. PROCEDURE: Informed written consent was obtained from the patient after a discussion of the risks, benefits and alternatives to treatment. A timeout was performed prior to the  initiation of the procedure. Initial ultrasound scanning demonstrates a large amount of ascites within the left lower abdominal quadrant. The left lower abdomen was prepped and draped in the usual sterile fashion. 1% lidocaine was used for local anesthesia. Following this, a 19 gauge, 7-cm, Yueh catheter was introduced. An ultrasound image was saved for documentation purposes. The paracentesis was performed. The catheter was removed and a dressing was applied. The patient tolerated the procedure well without immediate post procedural complication. Patient received post-procedure intravenous albumin; see nursing notes for details. FINDINGS: A total of approximately 5.7 L of serosanguineous fluid was removed. IMPRESSION: Successful ultrasound-guided therapeutic LEFT paracentesis yielding 5.7 L of peritoneal fluid. Read by: Anders Grant, NP Electronically Signed   By: Roanna Banning M.D.   On: 07/14/2023 15:12    Assessment/Plan Present on Admission:  GI bleed //  symptomatic anemia, acute blood loss anemia// serosanguineous peritoneal fluid -Heme positive stool -Serial H&H every 6 hours, first collection now -Protonix 40 mg IV every 12, Octreotide gtt ordered -Xarelto on hold.  Will reverse with Kcentra if hemoglobin drops precipitously -IV albumin ordered by gastroenterology -Resume midodrine 10 mg p.o. with meals, first dose now -Most recent colonoscopy 11/2020.  Showed external and internal hemorrhoids and 1 sessile polyp.  Patient seen by Dr. Gala Romney 02/28/2023.  Bensimhon declined to do EGD or colonoscopy stating patient was end-stage.  Patient on palliative care -Peritoneal fluid collected earlier not available for laboratory evaluation -GI consult placed by EDP  Left rectus abdominis intramuscular hematoma -Likely source of serosanguineous bleed.  Possibly secondary to left-sided paracentesis -Color doppler performed at bedside by IR post paracentesis negative for bleed -MRI liver ordered  to evaluate hypoattenuation noted on CT -Oral Vitamin K ordered. Xarelto DC'd.  Following serial H&H's -Hematoma will hopefully tamponade -Kcentra if hemoglobin falls precipitously   Hyponatremia -Likely secondary to volume overload -Patient on Demadex.  Held d/t patient's borderline blood pressure   Atrial fibrillation (HCC)  //  chronic anticoagulation -Currently in sinus rhythm,  -Xarelto on hold   End-stage liver cirrhosis //  Auto-anticoagulated -S/p left lower abdomen paracentesis.   -Patient with chronic hypotension likely related to patient's end-stage liver cirrhosis.  Midodrine resumed.   -Vitamin K ordered for ordered anticoagulation. -Demadex held d/t GIB and avoidance of worsening hypotension -MRI liver ordered to evaluate hypoattenuation noted on CT   Ischemic cardiomyopathy //  cardiac amyloidosis //  HFrEF -Demadex on hold.  Midodrine resumed -No ACEI or B-blockers due to patient's chronic hypotension.  Secondary to  his liver cirrhosis -On Vyndamax daily for cardiac amyloidosis.  On nonformulary.  Medication to be brought in in the a.m. Amvuttra every 3 months.  Continue vitamin A and MVI  -On palliative care   Benign prostatic hyperplasia -Flomax resumed   T2DM -Sliding scale insulin ordered. Jardiance on hold -Most recent FSBS 78. D5NS @50cc /hr ordered   Gout -Stable, continue allopurinol.  Colchicine as needed   OSA on CPAP -CPAP ordered   Hyperlipidemia -Stable Crestor resumed to   CKD stage III -Stable at baseline   Tamaya Pun 07/14/2023, 9:43 PM

## 2023-07-14 NOTE — ED Triage Notes (Signed)
Dark stools for the past 3 days  he had a parecentesis  earlier then brought here for treatemnt  no pain

## 2023-07-14 NOTE — ED Notes (Signed)
GI at bedside

## 2023-07-14 NOTE — ED Notes (Addendum)
ED TO INPATIENT HANDOFF REPORT  ED Nurse Name and Phone #: Theadora Rama [phone]  I Name/Age/Gender Frank Berg 67 y.o. male Room/Bed: 019C/019C  Code Status   Code Status: Do not attempt resuscitation (DNR) - Comfort care  Home/SNF/Other Home Patient oriented to: self, place, time, and situation Is this baseline? Yes   Triage Complete: Triage complete  Chief Complaint GI bleed [K92.2]  Triage Note Dark stools for the past 3 days  he had a parecentesis  earlier then brought here for treatemnt  no pain   Allergies Allergies  Allergen Reactions   Lipitor [Atorvastatin] Other (See Comments)    Body aches   Penicillins Swelling    Swollen face   Penicillin G     Other reaction(s): Unknown    Level of Care/Admitting Diagnosis ED Disposition     ED Disposition  Admit   Condition  --   Comment  Hospital Area: [facility] [[id number]]  Level of Care: Progressive [102]  Admit to Progressive based on following criteria: GI, ENDOCRINE disease patients with GI bleeding, acute liver failure or pancreatitis, stable with diabetic ketoacidosis or thyrotoxicosis (hypothyroid) state.  May admit patient to [name] or [facility] if equivalent level of care is available:: No  Covid Evaluation: Asymptomatic - no recent exposure (last 10 days) testing not required  Diagnosis: GI bleed [347425]  Admitting Physician: Gery Pray [[id number]]  Attending Physician: Gery Pray [[id number]]  Certification:: I certify this patient will need inpatient services for at least 2 midnights  Expected Medical Readiness: 07/16/2023          B Medical/Surgery History 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   HLD  (hyperlipidemia)    Hx of adenomatous polyp of colon 12/03/2020   Hypertension    Indwelling urinary catheter present 04/2023   OSA on CPAP    Sleep apnea    Past Surgical History:  Procedure Laterality Date   ABLATION     AORTIC VALVE REPLACEMENT  2011   CERVICAL DISC SURGERY     spinal stenosis   COLONOSCOPY     ESOPHAGOGASTRODUODENOSCOPY     IR PARACENTESIS  05/12/2023   IR PARACENTESIS  06/27/2023   IR PARACENTESIS  07/14/2023   RIGHT/LEFT HEART CATH AND CORONARY/GRAFT ANGIOGRAPHY N/A 03/18/2021   Procedure: RIGHT/LEFT HEART CATH AND CORONARY/GRAFT ANGIOGRAPHY;  Surgeon: Corky Crafts, MD;  Location: [facility];  Service: Cardiovascular;  Laterality: N/A;   TONSILLECTOMY       A IV Location/Drains/Wounds Patient Lines/Drains/Airways Status     Active Line/Drains/Airways     Name Placement date Placement time Site Days   Peripheral IV 07/14/23 22 G 1" Anterior;Right Forearm 07/14/23  1700  Forearm  less than 1   Peripheral IV 07/14/23 20 G 1" Anterior;Distal;Upper;Right Antecubital 07/14/23  1845  Antecubital  less than 1            Intake/Output Last 24 hours  Intake/Output Summary (Last 24 hours) at 07/14/2023 2259 Last data filed at 07/14/2023 2145 Gross per 24 hour  Intake 574 ml  Output --  Net 574 ml    Labs/Imaging Results for orders placed or performed during the hospital encounter of 07/14/23 (from the  past 48 hour(s))  Type and screen [facility]     Status: None (Preliminary result)   Collection Time: 07/14/23  4:20 PM  Result Value Ref Range   ABO/RH(D) B POS    Antibody Screen NEG    Sample Expiration 07/17/2023,2359    Unit Number [id number]    Blood Component Type RED CELLS,LR    Unit division 00    Status of Unit ISSUED    Transfusion Status OK TO TRANSFUSE    Crossmatch Result      Compatible Performed at [facility], [address], [place], [place] [address]    Unit Number [id number]     Blood Component Type RED CELLS,LR    Unit division 00    Status of Unit ALLOCATED    Transfusion Status OK TO TRANSFUSE    Crossmatch Result Compatible   Comprehensive metabolic panel     Status: Abnormal   Collection Time: 07/14/23  4:21 PM  Result Value Ref Range   Sodium 128 (L) 135 - 145 mmol/L   Potassium 4.3 3.5 - 5.1 mmol/L   Chloride 97 (L) 98 - 111 mmol/L   CO2 24 22 - 32 mmol/L   Glucose, Bld 109 (H) 70 - 99 mg/dL    Comment: Glucose reference range applies only to samples taken after fasting for at least 8 hours.   BUN 48 (H) 8 - 23 mg/dL   Creatinine, Ser 0.27 (H) 0.61 - 1.24 mg/dL   Calcium 8.2 (L) 8.9 - 10.3 mg/dL   Total Protein 7.5 6.5 - 8.1 g/dL   Albumin 2.7 (L) 3.5 - 5.0 g/dL   AST 43 (H) 15 - 41 U/L   ALT 11 0 - 44 U/L   Alkaline Phosphatase 121 38 - 126 U/L   Total Bilirubin 1.5 (H) 0.3 - 1.2 mg/dL   GFR, Estimated 31 (L) >60 mL/min    Comment: (NOTE) Calculated using the CKD-EPI Creatinine Equation (2021)    Anion gap 7 5 - 15    Comment: Performed at [facility], [address], [place], [place] [address]  CBC     Status: Abnormal   Collection Time: 07/14/23  4:21 PM  Result Value Ref Range   WBC 4.5 4.0 - 10.5 K/uL   RBC 3.26 (L) 4.22 - 5.81 MIL/uL   Hemoglobin 9.5 (L) 13.0 - 17.0 g/dL   HCT 44.0 (L) 34.7 - 42.5 %   MCV 89.9 80.0 - 100.0 fL   MCH 29.1 26.0 - 34.0 pg   MCHC 32.4 30.0 - 36.0 g/dL   RDW 95.6 (H) 38.7 - 56.4 %   Platelets 339 150 - 400 K/uL   nRBC 0.0 0.0 - 0.2 %    Comment: Performed at [facility], [address] [place], [place] [address]  Protime-INR - (order if Patient is taking Coumadin / Warfarin)     Status: Abnormal   Collection Time: 07/14/23  4:21 PM  Result Value Ref Range   Prothrombin Time 23.8 (H) 11.4 - 15.2 seconds   INR 2.1 (H) 0.8 - 1.2    Comment: (NOTE) INR goal varies based on device and disease states. Performed at [facility], [address], [place], [place] [address]   POC occult  blood, ED     Status: Abnormal   Collection Time: 07/14/23  4:47 PM  Result Value Ref Range   Fecal Occult Bld POSITIVE (A) NEGATIVE  Prepare RBC  Status: None   Collection Time: 07/14/23  4:52 PM  Result Value Ref Range   Order Confirmation      ORDER PROCESSED BY BLOOD BANK Performed at [facility], [address], [place], [place] [address]   I-Stat CG4 Lactic Acid     Status: None   Collection Time: 07/14/23  5:02 PM  Result Value Ref Range   Lactic Acid, Venous 1.5 0.5 - 1.9 mmol/L  ABO/Rh     Status: None   Collection Time: 07/14/23  5:17 PM  Result Value Ref Range   ABO/RH(D)      B POS Performed at [facility], [address] [place], [place] [address]   CBG monitoring, ED     Status: None   Collection Time: 07/14/23 10:19 PM  Result Value Ref Range   Glucose-Capillary 78 70 - 99 mg/dL    Comment: Glucose reference range applies only to samples taken after fasting for at least 8 hours.   CT Angio Abd/Pel W and/or Wo Contrast  Result Date: 07/14/2023 CLINICAL DATA:  Melena EXAM: CTA ABDOMEN AND PELVIS WITHOUT AND WITH CONTRAST TECHNIQUE: Multidetector CT imaging of the abdomen and pelvis was performed using the standard protocol during bolus administration of intravenous contrast. Multiplanar reconstructed images and MIPs were obtained and reviewed to evaluate the vascular anatomy. RADIATION DOSE REDUCTION: This exam was performed according to the departmental dose-optimization program which includes automated exposure control, adjustment of the mA and/or kV according to patient size and/or use of iterative reconstruction technique. CONTRAST:  80mL OMNIPAQUE IOHEXOL 350 MG/ML SOLN COMPARISON:  MR abdomen dated 01/21/2023 FINDINGS: VASCULAR Aorta: Normal caliber aorta without aneurysm, dissection, vasculitis or significant stenosis. Celiac and SMA: Common origin of the celiac and SMA. Patent without evidence of aneurysm, dissection, vasculitis or significant  stenosis. Renals: 1 right and 2 left renal arteries. Bilateral renal arteries are patent without evidence of aneurysm, dissection, vasculitis, fibromuscular dysplasia or significant stenosis. IMA: Severe narrowing of the IMA origin to calcified plaque due. No evidence of aneurysm, dissection, or vasculitis. Inflow: Segmental mild-to-moderate narrowing due to calcified atherosclerotic plaque. No evidence of aneurysm, dissection, or vasculitis. Proximal Outflow: Bilateral common femoral and visualized portions of the superficial and profunda femoral arteries are patent without evidence of aneurysm, dissection, vasculitis or significant stenosis. Veins: No obvious venous abnormality within the limitations of this arterial phase study. Review of the MIP images confirms the above findings. NON-VASCULAR Lower chest: Peripheral right lower lobe scarring. Trace right pleural effusion. Multichamber cardiomegaly. Coronary artery calcifications. Prior aortic valve replacement. Reflux of contrast material into the hepatic veins, suggesting a degree of right heart dysfunction. Hepatobiliary: Cirrhotic morphology. Ill-defined hypoattenuation measuring 1.1 cm in segment 2/3 (9:31). No intra or extrahepatic biliary ductal dilation. Normal gallbladder. Pancreas: No focal lesions or main ductal dilation. Spleen: Normal in size without focal abnormality. Adrenals/Urinary Tract: No adrenal nodules. No suspicious renal mass, calculi or hydronephrosis. Urinary bladder is underdistended with catheter in-situ. Stomach/Bowel: Normal appearance of the stomach. No evidence of bowel wall thickening, distention, or inflammatory changes. Appendix is not discretely seen. Lymphatic: No enlarged abdominal or pelvic lymph nodes. Reproductive: Enlargement of the prostate with median lobe hypertrophy. Other: Small volume ascites.  No free air or fluid collection. Musculoskeletal: No acute or abnormal lytic or blastic osseous lesions. Multilevel  degenerative changes of the partially imaged thoracic and lumbar spine. Partially imaged median sternotomy wires are nondisplaced. Mild body wall edema. Asymmetrically enlarged left rectus abdominis muscle with  a focus of active contrast extravasation (14:93). IMPRESSION: 1. No findings of active GI bleed. 2. Asymmetrically enlarged left rectus abdominis muscle with a focus of active contrast extravasation, consistent with intramuscular hematoma. 3. Cirrhotic morphology of the liver with ill-defined hypoattenuation measuring 1.1 cm in segment 2/3, which is indeterminate. Recommend nonemergent liver protocol MRI for further evaluation. 4. Small volume ascites. 5. Trace right pleural effusion. 6. Multichamber cardiomegaly with reflux of contrast material into the hepatic veins, suggesting a degree of right heart dysfunction. 7. Aortic Atherosclerosis (ICD10-I70.0). Coronary artery calcifications. Assessment for potential risk factor modification, dietary therapy or pharmacologic therapy may be warranted, if clinically indicated. Electronically Signed   By: Agustin Cree M.D.   On: 07/14/2023 20:34   IR Paracentesis  Result Date: 07/14/2023 INDICATION: 67 year old male. History of CHF with recurrent ascites. Request is for therapeutic paracentesis EXAM: ULTRASOUND GUIDED THERAPEUTIC LEFT-SIDED PARACENTESIS MEDICATIONS: lidocaine 1% 10 mL COMPLICATIONS: None immediate. PROCEDURE: Informed written consent was obtained from the patient after a discussion of the risks, benefits and alternatives to treatment. A timeout was performed prior to the initiation of the procedure. Initial ultrasound scanning demonstrates a large amount of ascites within the left lower abdominal quadrant. The left lower abdomen was prepped and draped in the usual sterile fashion. 1% lidocaine was used for local anesthesia. Following this, a 19 gauge, 7-cm, Yueh catheter was introduced. An ultrasound image was saved for documentation purposes. The  paracentesis was performed. The catheter was removed and a dressing was applied. The patient tolerated the procedure well without immediate post procedural complication. Patient received post-procedure intravenous albumin; see nursing notes for details. FINDINGS: A total of approximately 5.7 L of serosanguineous fluid was removed. IMPRESSION: Successful ultrasound-guided therapeutic LEFT paracentesis yielding 5.7 L of peritoneal fluid. Read by: Anders Grant, NP Electronically Signed   By: Roanna Banning M.D.   On: 07/14/2023 15:12    Pending Labs Unresulted Labs (From admission, onward)     Start     Ordered   07/15/23 0500  Comprehensive metabolic panel  Tomorrow morning,   R        07/14/23 2126   07/15/23 0500  Protime-INR  Tomorrow morning,   R        07/14/23 2126   07/15/23 0500  Ammonia  Tomorrow morning,   R        07/14/23 2126   07/14/23 2234  HIV Antibody (routine testing w rflx)  (HIV Antibody (Routine testing w reflex) panel)  Once,   R        07/14/23 2234   07/14/23 2126  Hemoglobin and hematocrit, blood  Now then every 6 hours,   R (with STAT occurrences)      07/14/23 2125   07/14/23 2125  Hemoglobin A1c  Once,   URGENT       Comments: To assess prior glycemic control    07/14/23 2124   07/14/23 2102  Hemoglobin and hematocrit, blood  Once,   STAT       Comments: After transfusion    07/14/23 2101            Vitals/Pain Today's Vitals   07/14/23 2200 07/14/23 2215 07/14/23 2230 07/14/23 2245  BP: 91/68 99/72 99/68    Pulse: 73 74 72 71  Resp:      Temp:   97.9 F (36.6 C) 98 F (36.7 C)  TempSrc:      SpO2: 100% 100% 100% 100%  Weight:      Height:  PainSc:        Isolation Precautions No active isolations  Medications Medications  octreotide (SANDOSTATIN) 2 mcg/mL load via infusion 50 mcg (50 mcg Intravenous Bolus from Bag 07/14/23 1727)    And  octreotide (SANDOSTATIN) 500 mcg in sodium chloride 0.9 % 250 mL (2 mcg/mL) infusion (50 mcg/hr  Intravenous New Bag/Given 07/14/23 1727)  insulin aspart (novoLOG) injection 0-15 Units ( Subcutaneous Not Given 07/14/23 2239)  pantoprazole (PROTONIX) injection 40 mg (has no administration in time range)  midodrine (PROAMATINE) tablet 10 mg (10 mg Oral Given 07/14/23 2253)  allopurinol (ZYLOPRIM) tablet 300 mg (has no administration in time range)  mirtazapine (REMERON) tablet 15 mg (has no administration in time range)  tamsulosin (FLOMAX) capsule 0.4 mg (0.4 mg Oral Given 07/14/23 2254)  dextrose 5 %-0.9 % sodium chloride infusion (has no administration in time range)  cefTRIAXone (ROCEPHIN) 1 g in sodium chloride 0.9 % 100 mL IVPB (has no administration in time range)  pantoprazole (PROTONIX) injection 40 mg (40 mg Intravenous Given 07/14/23 1704)  cefTRIAXone (ROCEPHIN) 1 g in sodium chloride 0.9 % 100 mL IVPB (0 g Intravenous Stopped 07/14/23 1737)  0.9 %  sodium chloride infusion (Manually program via Guardrails IV Fluids) ( Intravenous New Bag/Given 07/14/23 1730)  sodium chloride 0.9 % bolus 1,000 mL (1,000 mLs Intravenous New Bag/Given 07/14/23 1706)  albumin human 25 % solution 18.75 g (18.75 g Intravenous New Bag/Given 07/14/23 2227)  iohexol (OMNIPAQUE) 350 MG/ML injection 80 mL (80 mLs Intravenous Contrast Given 07/14/23 1854)    Mobility walks     Focused Assessments Cardiac Assessment Handoff:  Cardiac Rhythm: Normal sinus rhythm No results found for: "CKTOTAL", "CKMB", "CKMBINDEX", "TROPONINI" Lab Results  Component Value Date   DDIMER 0.31 11/04/2016   Does the Patient currently have chest pain? No   , Neuro Assessment Handoff:  Swallow screen pass? Yes  Cardiac Rhythm: Normal sinus rhythm       Neuro Assessment:   Neuro Checks:      Has TPA been given? No If patient is a Neuro Trauma and patient is going to OR before floor call report to 4N Charge nurse: [phone] or [phone]  , Pulmonary Assessment Handoff:  Lung sounds:   O2 Device: Room Air       R Recommendations: See Admitting Provider Note  Report given to:   Additional Notes:

## 2023-07-14 NOTE — ED Notes (Signed)
ED Provider at bedside. 

## 2023-07-14 NOTE — ED Provider Notes (Signed)
[place] [facility] DEPARTMENT AT [facility] Provider Note   CSN: [id number] Arrival date & time: 07/14/23  1500     History  Chief Complaint  Patient presents with   Rectal Bleeding    CLEOPHIS PRENDES is a 67 y.o. male.  HPI Patient reports that for approximately the past 5 days he has had 1 black bowel movement per day.  He denies diarrhea but does report that his stool is softer than usual.  He has not had fever but does endorse chills.  He was seen this morning for a paracentesis and reports that his fluid was the color of red wine rather than is usually yellow.  He does endorse mild abdominal discomfort.    Home Medications Prior to Admission medications   Medication Sig Start Date End Date Taking? Authorizing Provider  allopurinol (ZYLOPRIM) 300 MG tablet Take 300 mg by mouth daily. 11/23/19  Yes [provider]  colchicine 0.6 MG tablet Take 0.6 mg by mouth as needed. 02/14/23  Yes [provider]  empagliflozin (JARDIANCE) 10 MG TABS tablet take 1 tablet by mouth daily before breakfast 02/14/23  Yes Bensimhon, Bevelyn Buckles, MD  Ensure Max Protein (ENSURE MAX PROTEIN) LIQD Take 330 mLs (11 oz total) by mouth at bedtime. 12/20/21  Yes Sheikh, Omair Latif, DO  ferrous sulfate 325 (65 FE) MG tablet Take 1 tablet (325 mg total) by mouth daily with breakfast. 03/09/23  Yes [name], MD  loratadine (CLARITIN) 10 MG tablet Take 10 mg by mouth daily as needed for allergies.   Yes [provider]  midodrine (PROAMATINE) 10 MG tablet Take 1 tablet (10 mg total) by mouth 2 (two) times daily with a meal. 05/04/23  Yes Bensimhon, Bevelyn Buckles, MD  mirtazapine (REMERON) 15 MG tablet Take 15 mg by mouth at bedtime.   Yes [provider]  Multiple Vitamins-Minerals (CERTAVITE/ANTIOXIDANTS) TABS Take 1 tablet by mouth daily. 12/21/21  Yes Sheikh, Omair Latif, DO  Rivaroxaban (XARELTO) 15 MG TABS tablet Take 15 mg by mouth daily with supper.   Yes  [provider]  rosuvastatin (CRESTOR) 5 MG tablet Take 5 mg by mouth every evening. 08/26/16  Yes [provider]  tadalafil, PAH, (ADCIRCA) 20 MG tablet Take 20 mg by mouth daily as needed (ED). 05/06/21  Yes [provider]  Tafamidis (VYNDAMAX) 61 MG CAPS Take 1 capsule by mouth daily. 09/08/22  Yes Bensimhon, Bevelyn Buckles, MD  tamsulosin (FLOMAX) 0.4 MG CAPS capsule Take 0.4 mg by mouth at bedtime. 06/09/23  Yes [provider]  torsemide (DEMADEX) 20 MG tablet Take 2 tablets (40 mg total) by mouth 2 (two) times daily for 2 days, THEN 2 tablets (40 mg total) daily. Patient taking differently:  2 tablets (40 mg total) daily. 06/02/23 07/14/23 Yes Bensimhon, Bevelyn Buckles, MD  Vitamin A 2400 MCG (8000 UT) CAPS Take 1 capsule by mouth daily. 06/29/23  Yes Bensimhon, Bevelyn Buckles, MD  vutrisiran sodium (AMVUTTRA) 25 MG/0.5ML syringe Inject 0.5 mLs (25 mg total) into the skin every 3 (three) months. 06/23/23  Yes Bensimhon, Bevelyn Buckles, MD  zolpidem (AMBIEN) 5 MG tablet Take 5 mg by mouth at bedtime as needed. 02/13/23  Yes [provider]  glucose blood (ONETOUCH VERIO) test strip TEST ONCE A DAY DX E11.29    [provider]  metFORMIN (GLUCOPHAGE) 1000 MG tablet Take 1,000 mg by mouth 2 (two) times daily. Patient not taking: Reported on 07/14/2023 03/27/23   [provider]      Allergies    Lipitor [atorvastatin], Penicillins, and Penicillin g    Review of Systems   Review of Systems  Physical Exam Updated Vital Signs BP 91/68   Pulse 74   Temp 97.7 F (36.5 C) (Oral)   Resp 15   Ht 6\' 2"  (1.88 m)   Wt 75.8 kg   SpO2 100%   BMI 21.46 kg/m  Physical Exam Vitals and nursing note reviewed.  Constitutional:      General: He is in acute distress.     Appearance: He is well-developed. He is ill-appearing.  HENT:     Head: Normocephalic and atraumatic.     Mouth/Throat:     Mouth: Mucous membranes are dry.  Cardiovascular:     Rate and Rhythm:  Normal rate and regular rhythm.  Pulmonary:     Effort: Pulmonary effort is normal. No respiratory distress.     Breath sounds: Normal breath sounds.  Abdominal:     General: There is distension.     Tenderness: There is abdominal tenderness. There is no guarding or rebound.  Musculoskeletal:     Cervical back: Neck supple.     Right lower leg: Edema present.     Left lower leg: Edema present.  Skin:    General: Skin is warm and dry.  Neurological:     Mental Status: He is alert.     ED Results / Procedures / Treatments   Labs (all labs ordered are listed, but only abnormal results are displayed) Labs Reviewed  COMPREHENSIVE METABOLIC PANEL - Abnormal; Notable for the following components:      Result Value   Sodium 128 (*)    Chloride 97 (*)    Glucose, Bld 109 (*)    BUN 48 (*)    Creatinine, Ser 2.23 (*)    Calcium 8.2 (*)    Albumin 2.7 (*)    AST 43 (*)    Total Bilirubin 1.5 (*)    GFR, Estimated 31 (*)    All other components within normal limits  CBC - Abnormal; Notable for the following components:   RBC 3.26 (*)    Hemoglobin 9.5 (*)    HCT 29.3 (*)    RDW 18.7 (*)    All other components within normal limits  PROTIME-INR - Abnormal; Notable for the following components:   Prothrombin Time 23.8 (*)    INR 2.1 (*)    All other components within normal limits  POC OCCULT BLOOD, ED - Abnormal; Notable for the following components:   Fecal Occult Bld POSITIVE (*)    All other components within normal limits  HEMOGLOBIN AND HEMATOCRIT, BLOOD  HEMOGLOBIN A1C  HEMOGLOBIN AND HEMATOCRIT, BLOOD  HEMOGLOBIN AND HEMATOCRIT, BLOOD  HEMOGLOBIN AND HEMATOCRIT, BLOOD  COMPREHENSIVE METABOLIC PANEL  PROTIME-INR  AMMONIA  I-STAT CG4 LACTIC ACID, ED  CBG MONITORING, ED  TYPE AND SCREEN  PREPARE RBC (CROSSMATCH)  ABO/RH    EKG EKG Interpretation Date/Time:  Friday July 14 2023 16:21:15 EDT Ventricular Rate:  75 PR Interval:  408 QRS Duration:  140 QT  Interval:  430 QTC Calculation: 480 R Axis:   96  Text Interpretation: Sinus rhythm with 1st degree A-V block with occasional Premature ventricular complexes Right bundle branch block Abnormal ECG When compared with ECG of 04-May-2023 12:04, PREVIOUS ECG IS PRESENT Confirmed by Eber Hong ([id number]) on 07/14/2023 4:28:07 PM  Radiology CT Angio Abd/Pel W and/or Wo Contrast  Result Date:  07/14/2023 CLINICAL DATA:  Melena EXAM: CTA ABDOMEN AND PELVIS WITHOUT AND WITH CONTRAST TECHNIQUE: Multidetector CT imaging of the abdomen and pelvis was performed using the standard protocol during bolus administration of intravenous contrast. Multiplanar reconstructed images and MIPs were obtained and reviewed to evaluate the vascular anatomy. RADIATION DOSE REDUCTION: This exam was performed according to the departmental dose-optimization program which includes automated exposure control, adjustment of the mA and/or kV according to patient size and/or use of iterative reconstruction technique. CONTRAST:  80mL OMNIPAQUE IOHEXOL 350 MG/ML SOLN COMPARISON:  MR abdomen dated 01/21/2023 FINDINGS: VASCULAR Aorta: Normal caliber aorta without aneurysm, dissection, vasculitis or significant stenosis. Celiac and SMA: Common origin of the celiac and SMA. Patent without evidence of aneurysm, dissection, vasculitis or significant stenosis. Renals: 1 right and 2 left renal arteries. Bilateral renal arteries are patent without evidence of aneurysm, dissection, vasculitis, fibromuscular dysplasia or significant stenosis. IMA: Severe narrowing of the IMA origin to calcified plaque due. No evidence of aneurysm, dissection, or vasculitis. Inflow: Segmental mild-to-moderate narrowing due to calcified atherosclerotic plaque. No evidence of aneurysm, dissection, or vasculitis. Proximal Outflow: Bilateral common femoral and visualized portions of the superficial and profunda femoral arteries are patent without evidence of aneurysm, dissection,  vasculitis or significant stenosis. Veins: No obvious venous abnormality within the limitations of this arterial phase study. Review of the MIP images confirms the above findings. NON-VASCULAR Lower chest: Peripheral right lower lobe scarring. Trace right pleural effusion. Multichamber cardiomegaly. Coronary artery calcifications. Prior aortic valve replacement. Reflux of contrast material into the hepatic veins, suggesting a degree of right heart dysfunction. Hepatobiliary: Cirrhotic morphology. Ill-defined hypoattenuation measuring 1.1 cm in segment 2/3 (9:31). No intra or extrahepatic biliary ductal dilation. Normal gallbladder. Pancreas: No focal lesions or main ductal dilation. Spleen: Normal in size without focal abnormality. Adrenals/Urinary Tract: No adrenal nodules. No suspicious renal mass, calculi or hydronephrosis. Urinary bladder is underdistended with catheter in-situ. Stomach/Bowel: Normal appearance of the stomach. No evidence of bowel wall thickening, distention, or inflammatory changes. Appendix is not discretely seen. Lymphatic: No enlarged abdominal or pelvic lymph nodes. Reproductive: Enlargement of the prostate with median lobe hypertrophy. Other: Small volume ascites.  No free air or fluid collection. Musculoskeletal: No acute or abnormal lytic or blastic osseous lesions. Multilevel degenerative changes of the partially imaged thoracic and lumbar spine. Partially imaged median sternotomy wires are nondisplaced. Mild body wall edema. Asymmetrically enlarged left rectus abdominis muscle with a focus of active contrast extravasation (14:93). IMPRESSION: 1. No findings of active GI bleed. 2. Asymmetrically enlarged left rectus abdominis muscle with a focus of active contrast extravasation, consistent with intramuscular hematoma. 3. Cirrhotic morphology of the liver with ill-defined hypoattenuation measuring 1.1 cm in segment 2/3, which is indeterminate. Recommend nonemergent liver protocol MRI for  further evaluation. 4. Small volume ascites. 5. Trace right pleural effusion. 6. Multichamber cardiomegaly with reflux of contrast material into the hepatic veins, suggesting a degree of right heart dysfunction. 7. Aortic Atherosclerosis (ICD10-I70.0). Coronary artery calcifications. Assessment for potential risk factor modification, dietary therapy or pharmacologic therapy may be warranted, if clinically indicated. Electronically Signed   By: Agustin Cree M.D.   On: 07/14/2023 20:34   IR Paracentesis  Result Date: 07/14/2023 INDICATION: 67 year old male. History of CHF with recurrent ascites. Request is for therapeutic paracentesis EXAM: ULTRASOUND GUIDED THERAPEUTIC LEFT-SIDED PARACENTESIS MEDICATIONS: lidocaine 1% 10 mL COMPLICATIONS: None immediate. PROCEDURE: Informed written consent was obtained from the patient after a discussion of the risks, benefits and alternatives to treatment. A timeout  was performed prior to the initiation of the procedure. Initial ultrasound scanning demonstrates a large amount of ascites within the left lower abdominal quadrant. The left lower abdomen was prepped and draped in the usual sterile fashion. 1% lidocaine was used for local anesthesia. Following this, a 19 gauge, 7-cm, Yueh catheter was introduced. An ultrasound image was saved for documentation purposes. The paracentesis was performed. The catheter was removed and a dressing was applied. The patient tolerated the procedure well without immediate post procedural complication. Patient received post-procedure intravenous albumin; see nursing notes for details. FINDINGS: A total of approximately 5.7 L of serosanguineous fluid was removed. IMPRESSION: Successful ultrasound-guided therapeutic LEFT paracentesis yielding 5.7 L of peritoneal fluid. Read by: Anders Grant, NP Electronically Signed   By: Roanna Banning M.D.   On: 07/14/2023 15:12    Procedures Procedures    Medications Ordered in ED Medications   octreotide (SANDOSTATIN) 2 mcg/mL load via infusion 50 mcg (50 mcg Intravenous Bolus from Bag 07/14/23 1727)    And  octreotide (SANDOSTATIN) 500 mcg in sodium chloride 0.9 % 250 mL (2 mcg/mL) infusion (50 mcg/hr Intravenous New Bag/Given 07/14/23 1727)  albumin human 25 % solution 18.75 g (has no administration in time range)  insulin aspart (novoLOG) injection 0-15 Units (has no administration in time range)  pantoprazole (PROTONIX) injection 40 mg (has no administration in time range)  midodrine (PROAMATINE) tablet 10 mg (has no administration in time range)  allopurinol (ZYLOPRIM) tablet 300 mg (has no administration in time range)  mirtazapine (REMERON) tablet 15 mg (has no administration in time range)  tamsulosin (FLOMAX) capsule 0.4 mg (has no administration in time range)  pantoprazole (PROTONIX) injection 40 mg (40 mg Intravenous Given 07/14/23 1704)  cefTRIAXone (ROCEPHIN) 1 g in sodium chloride 0.9 % 100 mL IVPB (0 g Intravenous Stopped 07/14/23 1737)  0.9 %  sodium chloride infusion (Manually program via Guardrails IV Fluids) ( Intravenous New Bag/Given 07/14/23 1730)  sodium chloride 0.9 % bolus 1,000 mL (1,000 mLs Intravenous New Bag/Given 07/14/23 1706)  iohexol (OMNIPAQUE) 350 MG/ML injection 80 mL (80 mLs Intravenous Contrast Given 07/14/23 1854)    ED Course/ Medical Decision Making/ A&P                                 Medical Decision Making Amount and/or Complexity of Data Reviewed Labs: ordered. Radiology: ordered.  Risk Prescription drug management. Decision regarding hospitalization.   Patient is a 67 year old male with a history of CAD, A-fib on Xarelto, CHF, hypertension, T2DM, CKD, cirrhosis presenting for rectal bleeding.  On my initial evaluation, he is afebrile, hypotensive.  He reports a 4-day history of black stools as well as weakness today.  On exam, he does have some abdominal distention and mild generalized discomfort with palpation.  Patient's  hypotension is concerning for hemorrhagic shock in the setting of black stools.  He does have a history of heart failure, additionally considered cardiogenic shock though he does not have cool extremities, will perform point-of-care ultrasound to further evaluate.  He has not had fever or localizing infectious symptoms, decreasing my concern for septic shock at this time.  Considered PE and obstructive shock however he reports compliance with his anticoagulation.  As patient does have history of cirrhosis, GI bleeding is concerning for variceal etiology.  Rate of bleeding seems less likely, will additionally consider peptic ulcer, diverticuli.  On my initial evaluation, performed point-of-care ultrasound which demonstrated  reduced EF and full IVC.  Still given his ongoing bleeding will give cautious IV fluids and blood.  Initiated 1 L bolus as well as 1 unit PRBCs.  Given concern for potential variceal bleed, additionally gave her tonics, octreotide, Rocephin.  Spoke with GI, do not feel strongly about initiating Kcentra for reversal of anticoagulation.  Results reviewed.  CBC without leukocytosis, anemia present with hemoglobin 9.5, prior from 2 months ago was 12.  CMP with mild hyponatremia to 128, creatinine is elevated but similar compared to prior.  Lactic acid 1.5.  INR is elevated.  Occult blood is positive.  CTA of the abdomen pelvis is without GI bleed but does demonstrate intramuscular hematoma of the left rectus with active extravasation.  This is consistent with patient's prior paracentesis site.  Patient was closely monitored throughout his ED course.  Blood pressure remained stable therefore have not initiated anticoagulation reversal with Kcentra.  Per GI, will admit to medicine for further bleeding workup in the morning.  Discussed with hospitalist admitted without further acute event under my care in the emergency department.        Final Clinical Impression(s) / ED Diagnoses Final  diagnoses:  Hypotension, unspecified hypotension type  Melena    Rx / DC Orders ED Discharge Orders     None         Claretha Cooper, DO 07/14/23 2223    Eber Hong, MD 07/15/23 [phone]

## 2023-07-14 NOTE — Progress Notes (Signed)
Consultation  Referring Provider:      Primary Care Physician:  Adrian Prince, MD Primary Gastroenterologist:        Dr. Leone Payor Reason for Consultation:     GI bleed         HPI:   ANTWIAN COLMENARES is a 67 y.o. 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  He has developed ascites as required multiple therapeutic paracentesis. He presented to the emergency department today after experiencing 3 to 4 days of black tarry stools and being advised to go to the emergency department. He had undergone therapeutic paracentesis earlier today and had 5.7 L of serosanguineous fluid removed.  The patient reports he has never had any problems with GI bleeding before.  He has never experienced any black or tarry stools before.  He has been feeling a little weaker over the last several days.  He will feel lightheaded if he stands up too quickly, but he denies any syncope or presyncope.  He is a little more short of breath than usual.  He has only been passing 1 stool per day, typically in the morning when he awakens.  His last bowel movement was this morning. He reports having a decreased appetite last week, but the past day or so has been normal.  No nausea or vomiting.  No abdominal pain. He remembers taking an over-the-counter pain medicine last week, just 1 pill, but does not remember what it was.  He is also taking a sleep aid recently, but does not know exactly what it is.  In the ED, his presenting vitals were 76/61, pulse 76 sat 100% Hemoglobin 9.5 down from 12 in July, MCV 90, platelets 339 CMP notable for BUN 48, creatinine 2.23 (BUN 57 in July, recent ranges in the 50s-70s) Lactic acid was 1.5  Review of outpatient encounters show that his blood  pressure is usually low, with systolics in the 80s, diastolics 60s.  He was previously seen by Dr. Leone Payor in the GI clinic, and had initially planned on EGD and colonoscopy to evaluate iron deficiency, however these procedures were canceled due to patient's progression of his cardiac disease and palliative care referral.     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   HLD (hyperlipidemia)    Hx of adenomatous polyp of colon 12/03/2020   Hypertension    Indwelling urinary catheter present 04/2023   OSA on CPAP    Sleep apnea     Past Surgical History:  Procedure Laterality Date   ABLATION     AORTIC VALVE REPLACEMENT  2011   CERVICAL DISC SURGERY     spinal stenosis   COLONOSCOPY     ESOPHAGOGASTRODUODENOSCOPY     IR PARACENTESIS  05/12/2023   IR PARACENTESIS  06/27/2023   IR PARACENTESIS  07/14/2023   RIGHT/LEFT HEART CATH AND CORONARY/GRAFT ANGIOGRAPHY N/A 03/18/2021   Procedure: RIGHT/LEFT HEART CATH AND CORONARY/GRAFT ANGIOGRAPHY;  Surgeon: Corky Crafts, MD;  Location: [facility];  Service: Cardiovascular;  Laterality: N/A;  TONSILLECTOMY      Family History  Problem Relation Age of Onset   Congenital heart disease Mother    Diabetes Mother    Hypertension Mother    Heart disease Mother    Heart attack Father    Cervical cancer Sister    Diabetes Sister    Hypertension Brother    Diabetes Brother    Pancreatic cancer Brother    Diabetes Brother    Breast cancer Sister    Diabetes Sister    Diabetes Sister    Diabetes Sister    Diabetes Sister    Diabetes Son    Colon cancer Neg Hx    Colon polyps Neg Hx    Esophageal cancer Neg Hx    Stomach cancer Neg Hx      Social History    Tobacco Use   Smoking status: Former    Current packs/day: 0.00    Average packs/day: 0.3 packs/day for 10.0 years (2.5 ttl pk-yrs)    Types: Cigarettes    Start date: 11/04/1971    Quit date: 11/03/1981    Years since quitting: 41.7   Smokeless tobacco: Never  Vaping Use   Vaping status: Never Used  Substance Use Topics   Alcohol use: Yes    Comment: occasional, none since December 2023   Drug use: No    Prior to Admission medications   Medication Sig Start Date End Date Taking? Authorizing Provider  allopurinol (ZYLOPRIM) 300 MG tablet Take 300 mg by mouth daily. 11/23/19   [provider]  colchicine 0.6 MG tablet Take by mouth as needed. 02/14/23   [provider]  empagliflozin (JARDIANCE) 10 MG TABS tablet take 1 tablet by mouth daily before breakfast 02/14/23   Bensimhon, Bevelyn Buckles, MD  Ensure Max Protein (ENSURE MAX PROTEIN) LIQD Take 330 mLs (11 oz total) by mouth at bedtime. 12/20/21   Marguerita Merles Latif, DO  ferrous sulfate 325 (65 FE) MG tablet Take 1 tablet (325 mg total) by mouth daily with breakfast. 03/09/23   [name], MD  glucose blood (ONETOUCH VERIO) test strip TEST ONCE A DAY DX E11.29    [provider]  loratadine (CLARITIN) 10 MG tablet Take 10 mg by mouth daily as needed for allergies.    [provider]  midodrine (PROAMATINE) 10 MG tablet Take 1 tablet (10 mg total) by mouth 2 (two) times daily with a meal. 05/04/23   Bensimhon, Bevelyn Buckles, MD  mirtazapine (REMERON) 15 MG tablet Take 15 mg by mouth at bedtime.    [provider]  Multiple Vitamins-Minerals (CERTAVITE/ANTIOXIDANTS) TABS Take 1 tablet by mouth daily. 12/21/21   Marguerita Merles Latif, DO  Rivaroxaban (XARELTO) 15 MG TABS tablet Take 15 mg by mouth daily with supper.    [provider]  rosuvastatin (CRESTOR) 5 MG tablet Take 5 mg by mouth every evening. 08/26/16   [provider]  tadalafil, PAH, (ADCIRCA) 20 MG tablet Take 20 mg by  mouth daily as needed (ED). 05/06/21   [provider]  Tafamidis (VYNDAMAX) 61 MG CAPS Take 1 capsule by mouth daily. 09/08/22   Bensimhon, Bevelyn Buckles, MD  torsemide (DEMADEX) 20 MG tablet Take 2 tablets (40 mg total) by mouth 2 (two) times daily for 2 days, THEN 2 tablets (40 mg total) daily. 06/02/23 07/04/23  Bensimhon, Bevelyn Buckles, MD  Vitamin A 2400 MCG (8000 UT) CAPS Take 1 capsule by mouth daily. 06/29/23   Bensimhon, Bevelyn Buckles, MD  vutrisiran sodium (AMVUTTRA) 25 MG/0.5ML syringe Inject 0.5 mLs (25 mg total) into the skin every 3 (three) months. 06/23/23   Bensimhon, Bevelyn Buckles, MD  zolpidem (AMBIEN) 5 MG tablet Take 5 mg by mouth at bedtime as needed. 02/13/23   [provider]    Current Facility-Administered Medications  Medication Dose Route Frequency Provider Last Rate Last Admin   albumin human 25 % solution 18.75 g  18.75 g Intravenous Once Claretha Cooper, DO       octreotide (SANDOSTATIN) 500 mcg in sodium chloride 0.9 % 250 mL (2 mcg/mL) infusion  50 mcg/hr Intravenous Continuous Claretha Cooper, DO 25 mL/hr at 07/14/23 1727 50 mcg/hr at 07/14/23 1727   Current Outpatient Medications  Medication Sig Dispense Refill   allopurinol (ZYLOPRIM) 300 MG tablet Take 300 mg by mouth daily.     colchicine 0.6 MG tablet Take by mouth as needed.     empagliflozin (JARDIANCE) 10 MG TABS tablet take 1 tablet by mouth daily before breakfast 90 tablet 3   Ensure Max Protein (ENSURE MAX PROTEIN) LIQD Take 330 mLs (11 oz total) by mouth at bedtime. 3330 mL 0   ferrous sulfate 325 (65 FE) MG tablet Take 1 tablet (325 mg total) by mouth daily with breakfast.  3   glucose blood (ONETOUCH VERIO) test strip TEST ONCE A DAY DX E11.29     loratadine (CLARITIN) 10 MG tablet Take 10 mg by mouth daily as needed for allergies.     midodrine (PROAMATINE) 10 MG tablet Take 1 tablet (10 mg total) by mouth 2 (two) times daily with a meal. 60 tablet 11   mirtazapine (REMERON) 15 MG tablet Take 15 mg by  mouth at bedtime.     Multiple Vitamins-Minerals (CERTAVITE/ANTIOXIDANTS) TABS Take 1 tablet by mouth daily. 30 tablet 0   Rivaroxaban (XARELTO) 15 MG TABS tablet Take 15 mg by mouth daily with supper.     rosuvastatin (CRESTOR) 5 MG tablet Take 5 mg by mouth every evening.     tadalafil, PAH, (ADCIRCA) 20 MG tablet Take 20 mg by mouth daily as needed (ED).     Tafamidis (VYNDAMAX) 61 MG CAPS Take 1 capsule by mouth daily. 90 capsule 3   torsemide (DEMADEX) 20 MG tablet Take 2 tablets (40 mg total) by mouth 2 (two) times daily for 2 days, THEN 2 tablets (40 mg total) daily. 90 tablet 3   Vitamin A 2400 MCG (8000 UT) CAPS Take 1 capsule by mouth daily.     vutrisiran sodium (AMVUTTRA) 25 MG/0.5ML syringe Inject 0.5 mLs (25 mg total) into the skin every 3 (three) months.     zolpidem (AMBIEN) 5 MG tablet Take 5 mg by mouth at bedtime as needed.      Allergies as of 07/14/2023 - Review Complete 07/14/2023  Allergen Reaction Noted   Lipitor [atorvastatin] Other (See Comments)    Penicillins Swelling 11/03/2016   Penicillin g  11/01/2021     Review of Systems:    As per HPI, otherwise negative    Physical Exam:  Vital signs in last 24 hours: Temp:  [97.7 F (36.5 C)] 97.7 F (36.5 C) (09/20 1803) Pulse Rate:  [72-126] 73 (09/20 1803) Resp:  [13-19] 14 (09/20 1803) BP: (76-102)/(56-84) 86/67 (09/20 1803) SpO2:  [78 %-100 %] 100 % (09/20 1803) Weight:  [75.8 kg] 75.8 kg (09/20 1611)   General:   Pleasant chronically ill appearing and frail African-American male lying in bed Head:  Normocephalic and atraumatic. Eyes:  No icterus.   Conjunctiva pink. Ears:  Normal auditory acuity. Neck:  Supple Lungs:  Respirations even and unlabored. Lungs clear to auscultation bilaterally.   No wheezes, crackles, or rhonchi.  Heart:  Regular rate and rhythm; no MRG; sternotomy scars Abdomen:  Soft, nondistended, nontender. Normal bowel sounds. No appreciable masses or hepatomegaly.  Midline  pulsatile mass in the epigastrium, left lower quadrant bandage from paracentesis, not removed Rectal:  Not performed.  Msk:  Symmetrical without gross deformities.  Extremities: 2+ bilateral pitting edema. Neurologic:  Alert and  oriented x4;  grossly normal neurologically. Skin:  Intact without significant lesions or rashes. Psych:  Alert and cooperative. Normal affect.  LAB RESULTS: Recent Labs    07/14/23 1621  WBC 4.5  HGB 9.5*  HCT 29.3*  PLT 339   BMET Recent Labs    07/14/23 1621  NA 128*  K 4.3  CL 97*  CO2 24  GLUCOSE 109*  BUN 48*  CREATININE 2.23*  CALCIUM 8.2*   LFT Recent Labs    07/14/23 1621  PROT 7.5  ALBUMIN 2.7*  AST 43*  ALT 11  ALKPHOS 121  BILITOT 1.5*   PT/INR Recent Labs    07/14/23 1621  LABPROT 23.8*  INR 2.1*    STUDIES: IR Paracentesis  Result Date: 07/14/2023 INDICATION: 67 year old male. History of CHF with recurrent ascites. Request is for therapeutic paracentesis EXAM: ULTRASOUND GUIDED THERAPEUTIC LEFT-SIDED PARACENTESIS MEDICATIONS: lidocaine 1% 10 mL COMPLICATIONS: None immediate. PROCEDURE: Informed written consent was obtained from the patient after a discussion of the risks, benefits and alternatives to treatment. A timeout was performed prior to the initiation of the procedure. Initial ultrasound scanning demonstrates a large amount of ascites within the left lower abdominal quadrant. The left lower abdomen was prepped and draped in the usual sterile fashion. 1% lidocaine was used for local anesthesia. Following this, a 19 gauge, 7-cm, Yueh catheter was introduced. An ultrasound image was saved for documentation purposes. The paracentesis was performed. The catheter was removed and a dressing was applied. The patient tolerated the procedure well without immediate post procedural complication. Patient received post-procedure intravenous albumin; see nursing notes for details. FINDINGS: A total of approximately 5.7 L of  serosanguineous fluid was removed. IMPRESSION: Successful ultrasound-guided therapeutic LEFT paracentesis yielding 5.7 L of peritoneal fluid. Read by: Anders Grant, NP Electronically Signed   By: Roanna Banning M.D.   On: 07/14/2023 15:12     PREVIOUS ENDOSCOPIES:            Colonoscopy November 25, 2020 (Dr. Leone Payor) Indication: Positive fecal occult blood test Difficult colonoscopy due to extremely redundant colon and significant looping Diminutive transverse colon polyp Internal next internal hemorrhoids   MELD 3.0: 30 at 07/14/2023  4:21 PM MELD-Na: 29 at 07/14/2023  4:21 PM Calculated from: Serum Creatinine: 2.23 mg/dL at 9/60/4540  9:81 PM Serum Sodium: 128 mmol/L at 07/14/2023  4:21 PM Total Bilirubin: 1.5 mg/dL at 1/91/4782  9:56 PM Serum Albumin: 2.7 g/dL at 12/07/863  7:84 PM INR(ratio): 2.1 at 07/14/2023  4:21 PM Age at listing (hypothetical): 62 years Sex: Male at 07/14/2023  4:21 PM     Impression / Plan:   67 year old male with end-stage cardiac amyloidosis, with palliative care referral, history of atrial fibrillation status post ablation, coronary artery disease status post CABG and cuspid aortic status post bovine AVR, with 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  BUN not significantly elevated above baseline.  His presenting blood pressure in the 70s is not significantly lower than his baseline of the 80s.  He does appear ill and weak. Although he has a diagnosis of cirrhosis, he does not appear to have significant portal hypertension based on his most recent abdominal imaging in April and normal platelets.  His INR is elevated, but this is confounded by his Xarelto use.  He denies any significant recent NSAID use, but is not a good historian in terms of the over-the-counter medications he has been taking.   The patient  warrants an upper endoscopy to determine etiology of bleeding and provide hemostasis if needed.  Prior to endoscopy, the patient needs to be adequately resuscitated and initiated on proper medical therapy.  Melena, presumed upper GI bleed - Agree with starting octreotide, although suspicion for variceal bleed is low - Protonix drip - Recommend starting empiric antibiotics (ceftriaxone) given GI bleed in cirrhotic patient - Patient receiving 2 units PRBCs at the time of my evaluation - Recommend colloids for initial volume support - Consider Kcentra/reversal agents if patient having large-volume overt GI bleeding or if hypotension worsens despite volume resuscitation - Hold Xarelto - N.p.o. for now, can give clear liquids later this evening if patient appears more stable - Will tentatively plan for EGD tomorrow.  Given the patient's advanced cardiac comorbidities, it would also be reasonable to treat conservatively if bleeding resolves, to avoid the risks of sedation.  I will rediscuss the risks and benefits of endoscopy tomorrow morning with the patient.  Cirrhosis with ascites -Hold torsemide -MELD score not accurate due to Xarelto  Cardiac amyloidosis/CAD status post CABG/history of A-fib status post ablation  Thanks   LOS: 0 days   Jenel Lucks  07/14/2023, 6:06 PM

## 2023-07-14 NOTE — Progress Notes (Addendum)
Ultrasound-guided diagnostic and therapeutic paracentesis performed yielding 5.7 liters of serosangious colored fluid.  No immediate complications. EBL is none.  Patient reporting dark stools. No lightheaded, dizziness or syncopal episodes. Recommend calling PCP for further evaluation. IR Attending made aware. Color doppler performed at bedside negative for bleed.

## 2023-07-15 ENCOUNTER — Encounter (HOSPITAL_COMMUNITY): Payer: Self-pay

## 2023-07-15 ENCOUNTER — Inpatient Hospital Stay (HOSPITAL_COMMUNITY): Payer: Medicare HMO

## 2023-07-15 DIAGNOSIS — S301XXA Contusion of abdominal wall, initial encounter: Secondary | ICD-10-CM

## 2023-07-15 DIAGNOSIS — D62 Acute posthemorrhagic anemia: Secondary | ICD-10-CM

## 2023-07-15 DIAGNOSIS — K921 Melena: Secondary | ICD-10-CM | POA: Diagnosis not present

## 2023-07-15 DIAGNOSIS — K7460 Unspecified cirrhosis of liver: Secondary | ICD-10-CM | POA: Diagnosis not present

## 2023-07-15 DIAGNOSIS — E854 Organ-limited amyloidosis: Secondary | ICD-10-CM | POA: Diagnosis not present

## 2023-07-15 DIAGNOSIS — Z7901 Long term (current) use of anticoagulants: Secondary | ICD-10-CM

## 2023-07-15 LAB — COMPREHENSIVE METABOLIC PANEL
ALT: 10 U/L (ref 0–44)
AST: 37 U/L (ref 15–41)
Albumin: 2.7 g/dL — ABNORMAL LOW (ref 3.5–5.0)
Alkaline Phosphatase: 89 U/L (ref 38–126)
Anion gap: 12 (ref 5–15)
BUN: 43 mg/dL — ABNORMAL HIGH (ref 8–23)
CO2: 20 mmol/L — ABNORMAL LOW (ref 22–32)
Calcium: 8.1 mg/dL — ABNORMAL LOW (ref 8.9–10.3)
Chloride: 96 mmol/L — ABNORMAL LOW (ref 98–111)
Creatinine, Ser: 2.04 mg/dL — ABNORMAL HIGH (ref 0.61–1.24)
GFR, Estimated: 35 mL/min — ABNORMAL LOW (ref >60)
Glucose, Bld: 127 mg/dL — ABNORMAL HIGH (ref 70–99)
Potassium: 4 mmol/L (ref 3.5–5.1)
Sodium: 128 mmol/L — ABNORMAL LOW (ref 135–145)
Total Bilirubin: 1.4 mg/dL — ABNORMAL HIGH (ref 0.3–1.2)
Total Protein: 6.4 g/dL — ABNORMAL LOW (ref 6.5–8.1)

## 2023-07-15 LAB — PROTIME-INR
INR: 1.9 — ABNORMAL HIGH (ref 0.8–1.2)
Prothrombin Time: 22.4 seconds — ABNORMAL HIGH (ref 11.4–15.2)

## 2023-07-15 LAB — HEMOGLOBIN AND HEMATOCRIT, BLOOD
HCT: 30.8 % — ABNORMAL LOW (ref 39.0–52.0)
HCT: 28.8 % — ABNORMAL LOW (ref 39.0–52.0)
HCT: 28.8 % — ABNORMAL LOW (ref 39.0–52.0)
HCT: 35.1 % — ABNORMAL LOW (ref 39.0–52.0)
HCT: 31 % — ABNORMAL LOW (ref 39.0–52.0)
Hemoglobin: 10 g/dL — ABNORMAL LOW (ref 13.0–17.0)
Hemoglobin: 9.5 g/dL — ABNORMAL LOW (ref 13.0–17.0)
Hemoglobin: 9.7 g/dL — ABNORMAL LOW (ref 13.0–17.0)
Hemoglobin: 11.4 g/dL — ABNORMAL LOW (ref 13.0–17.0)
Hemoglobin: 10.2 g/dL — ABNORMAL LOW (ref 13.0–17.0)

## 2023-07-15 LAB — AMMONIA: Ammonia: 21 umol/L (ref 9–35)

## 2023-07-15 LAB — HIV ANTIBODY (ROUTINE TESTING W REFLEX): HIV Screen 4th Generation wRfx: NONREACTIVE

## 2023-07-15 LAB — HEMOGLOBIN A1C
Hgb A1c MFr Bld: 5.8 % — ABNORMAL HIGH (ref 4.8–5.6)
Mean Plasma Glucose: 119.76 mg/dL

## 2023-07-15 MED ORDER — MELATONIN 5 MG PO TABS: 5.0000 mg | ORAL_TABLET | Freq: Every evening | ORAL | Status: DC | PRN

## 2023-07-15 MED ORDER — PHYTONADIONE 5 MG PO TABS
10.0000 mg | ORAL_TABLET | Freq: Once | ORAL | Status: AC
  Administered 2023-07-15: 10 mg via ORAL
  Filled 2023-07-15: qty 2

## 2023-07-15 MED ORDER — ACETAMINOPHEN 325 MG PO TABS
650.0000 mg | ORAL_TABLET | Freq: Four times a day (QID) | ORAL | Status: DC | PRN
  Administered 2023-07-16: 650 mg via ORAL
  Filled 2023-07-15: qty 2

## 2023-07-15 MED ORDER — PROCHLORPERAZINE EDISYLATE 10 MG/2ML IJ SOLN: 5.0000 mg | Freq: Four times a day (QID) | INTRAMUSCULAR | Status: DC | PRN

## 2023-07-15 MED ORDER — PHENOL 1.4 % MT LIQD
1.0000 | OROMUCOSAL | Status: DC | PRN
  Administered 2023-07-15: 1 via OROMUCOSAL

## 2023-07-15 MED ORDER — POLYETHYLENE GLYCOL 3350 17 G PO PACK: 17.0000 g | PACK | Freq: Every day | ORAL | Status: DC | PRN

## 2023-07-15 MED ORDER — GADOBUTROL 1 MMOL/ML IV SOLN
6.9000 mL | Freq: Once | INTRAVENOUS | Status: AC | PRN
  Administered 2023-07-15: 6.9 mL via INTRAVENOUS

## 2023-07-15 NOTE — Progress Notes (Addendum)
1000: Primary nurse accompanied patient to MRI due to continuous fluids (octreotide and IVF) 1157: Patient back from MRI. Informed of full liquid diet- provided with ginger ale and cranberry per patient request. Leg foley bag switched to larger foley bag per patient request. Patient placed back on telemetry at this time.

## 2023-07-15 NOTE — Progress Notes (Signed)
   07/15/23 1756  Vitals  Temp 97.6 F (36.4 C)  Temp Source Oral  BP 91/74  MAP (mmHg) 81  BP Location Right Arm  BP Method Automatic  Patient Position (if appropriate) Lying  Pulse Rate 72  Pulse Rate Source Monitor  ECG Heart Rate 72  Resp 17  Level of Consciousness  Level of Consciousness Alert  MEWS COLOR  MEWS Score Color Green  Oxygen Therapy  SpO2 97 %  O2 Device Room Air  Pain Assessment  Pain Scale 0-10  Pain Score 0  MEWS Score  MEWS Temp 0  MEWS Systolic 1  MEWS Pulse 0  MEWS RR 0  MEWS LOC 0  MEWS Score 1   Received call from telemetry that patient switched from NSR to an accelerated junctional rhythm. Hospitalist paged. Vital signs obtained and 12 Lead EKG obtained. Patient denied any chest pain or shortness of breath.

## 2023-07-15 NOTE — Progress Notes (Signed)
   07/15/23 0113  BiPAP/CPAP/SIPAP  $ Non-Invasive Ventilator  Non-Invasive Vent Initial  $ Face Mask Large  Yes  BiPAP/CPAP/SIPAP Pt Type Adult  BiPAP/CPAP/SIPAP Resmed  Mask Type Full face mask  Mask Size Large  PEEP 5 cmH20  FiO2 (%) 21 %

## 2023-07-15 NOTE — Progress Notes (Signed)
   07/15/23 2036  BiPAP/CPAP/SIPAP  $ Non-Invasive Ventilator  Non-Invasive Vent Subsequent  BiPAP/CPAP/SIPAP Pt Type Adult  BiPAP/CPAP/SIPAP Resmed  Mask Type Full face mask  Mask Size Large  PEEP 15 cmH20  Patient Home Equipment Yes  Nasal massage performed Yes  CPAP/SIPAP surface wiped down Yes  Safety Check Completed by RT for Home Unit Yes, no issues noted

## 2023-07-15 NOTE — Plan of Care (Signed)
Pt systolic blood pressures continue to be low in 80s-90s.

## 2023-07-15 NOTE — Plan of Care (Signed)

## 2023-07-15 NOTE — Progress Notes (Signed)
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  Patient was complaining of black tarry stool for the last 4 days.  More frequently lightheaded and dizziness over the past week.  In the emergency room blood pressures were borderline low with systolic blood pressure 90.  Hemoglobin 9.5, recent baseline of 12.  Heme positive.  Transfusion ordered and patient was admitted to the hospital with GI consultation.   Assessment & Plan:   Symptomatic anemia, suspected upper GI bleeding: Protonix, octreotide, GI follow-up.  Rocephin for prophylaxis.  Overnight 1 unit of PRBC. Hemoglobin has remained stable now.  No clinical evidence of ongoing bleeding.  Continue to monitor every 12 hours.  Left rectus abdominis intramuscular hematoma: Due to paracentesis.  Continue to monitor.  Local therapies.  Hyponatremia: Dilutional hyponatremia.  Monitor.  Chronic atrial fibrillation: On Xarelto.  On hold due to suspected bleeding.  End-stage liver disease, liver cirrhosis: Chronic hypotension.  Patient on midodrine. MRI of the liver pending.  Ischemic cardiomyopathy/cardiac amyloidosis/heart failure with reduced ejection fraction: Demadex on hold.  Midodrine resumed.  No ACE or beta-blockers due to hypotension.  On palliative care at home.  BPH: On Flomax.  Type 2 diabetes: On sliding scale insulin.  Obstructive sleep apnea: On CPAP.   DVT prophylaxis: SCDs Start: 07/14/23 2234   Code Status: DNR comfort care Family Communication: None at the bedside Disposition Plan: Status is: Inpatient Remains inpatient appropriate  because: IV fluid, IV infusions     Consultants:  GI  Procedures:  None  Antimicrobials:  Rocephin 9/20---   Subjective: Patient seen and examined.  Came back from MRI.  Patient denies any complaints.  Denies any nausea vomiting.  He does have some abdominal discomfort but mostly chronic. No bowel movement since yesterday morning. Last BM yesterday was black.  Blood pressures are low but mostly chronic.  Objective: Vitals:   07/14/23 2345 07/15/23 0007 07/15/23 0416 07/15/23 0733  BP: 95/64 95/74 (!) 85/56 (!) 82/63  Pulse: 73 76 72 71  Resp: 15 16 16 16   Temp:   98.4 F (36.9 C) 97.7 F (36.5 C)  TempSrc:   Oral Oral  SpO2: 100% 100% 99% 99%  Weight:   69 kg   Height:        Intake/Output Summary (Last 24 hours) at 07/15/2023 1353 Last data filed at 07/15/2023 1330 Gross per 24 hour  Intake 2128 ml  Output 2100 ml  Net 28 ml   Filed Weights   07/14/23 1611 07/15/23 0416  Weight: 75.8 kg 69 kg    Examination:  General: Chronically sick looking.  Thin and frail.  Quite debilitated. Cardiovascular: S1-S2 normal.  Regular rate rhythm. Respiratory: Bilateral clear.  Poor inspiratory effort. Gastrointestinal: Soft.  Distended.  Mildly tender along the midline, no rigidity or guarding.  Bowel sound present. Ext: No swelling or edema.  No cyanosis. Neuro: Alert and awake.  Generalized weakness.  No focal deficits. Foley catheter with clear urine.    Data Reviewed: I have personally reviewed following labs and imaging studies  CBC: Recent Labs  Lab 07/14/23 1621 07/15/23 0150 07/15/23 0247 07/15/23 0754  WBC 4.5  --   --   --  HGB 9.5* 10.0* 9.5* 9.7*  HCT 29.3* 30.8* 28.8* 28.8*  MCV 89.9  --   --   --   PLT 339  --   --   --    Basic Metabolic Panel: Recent Labs  Lab 07/14/23 1621 07/15/23 0150  NA 128* 128*  K 4.3 4.0  CL 97* 96*  CO2 24 20*  GLUCOSE 109* 127*  BUN 48* 43*  CREATININE 2.23* 2.04*  CALCIUM 8.2* 8.1*   GFR: Estimated  Creatinine Clearance: 33.4 mL/min (A) (by C-G formula based on SCr of 2.04 mg/dL (H)). Liver Function Tests: Recent Labs  Lab 07/14/23 1621 07/15/23 0150  AST 43* 37  ALT 11 10  ALKPHOS 121 89  BILITOT 1.5* 1.4*  PROT 7.5 6.4*  ALBUMIN 2.7* 2.7*   No results for input(s): "LIPASE", "AMYLASE" in the last 168 hours. Recent Labs  Lab 07/15/23 0150  AMMONIA 21   Coagulation Profile: Recent Labs  Lab 07/14/23 1621 07/15/23 0150  INR 2.1* 1.9*   Cardiac Enzymes: No results for input(s): "CKTOTAL", "CKMB", "CKMBINDEX", "TROPONINI" in the last 168 hours. BNP (last 3 results) No results for input(s): "PROBNP" in the last 8760 hours. HbA1C: Recent Labs    07/15/23 0150  HGBA1C 5.8*   CBG: Recent Labs  Lab 07/14/23 2219  GLUCAP 78   Lipid Profile: No results for input(s): "CHOL", "HDL", "LDLCALC", "TRIG", "CHOLHDL", "LDLDIRECT" in the last 72 hours. Thyroid Function Tests: No results for input(s): "TSH", "T4TOTAL", "FREET4", "T3FREE", "THYROIDAB" in the last 72 hours. Anemia Panel: No results for input(s): "VITAMINB12", "FOLATE", "FERRITIN", "TIBC", "IRON", "RETICCTPCT" in the last 72 hours. Sepsis Labs: Recent Labs  Lab 07/14/23 1702  LATICACIDVEN 1.5    No results found for this or any previous visit (from the past 240 hour(s)).       Radiology Studies: CT Angio Abd/Pel W and/or Wo Contrast  Result Date: 07/14/2023 CLINICAL DATA:  Melena EXAM: CTA ABDOMEN AND PELVIS WITHOUT AND WITH CONTRAST TECHNIQUE: Multidetector CT imaging of the abdomen and pelvis was performed using the standard protocol during bolus administration of intravenous contrast. Multiplanar reconstructed images and MIPs were obtained and reviewed to evaluate the vascular anatomy. RADIATION DOSE REDUCTION: This exam was performed according to the departmental dose-optimization program which includes automated exposure control, adjustment of the mA and/or kV according to patient size and/or use of  iterative reconstruction technique. CONTRAST:  80mL OMNIPAQUE IOHEXOL 350 MG/ML SOLN COMPARISON:  MR abdomen dated 01/21/2023 FINDINGS: VASCULAR Aorta: Normal caliber aorta without aneurysm, dissection, vasculitis or significant stenosis. Celiac and SMA: Common origin of the celiac and SMA. Patent without evidence of aneurysm, dissection, vasculitis or significant stenosis. Renals: 1 right and 2 left renal arteries. Bilateral renal arteries are patent without evidence of aneurysm, dissection, vasculitis, fibromuscular dysplasia or significant stenosis. IMA: Severe narrowing of the IMA origin to calcified plaque due. No evidence of aneurysm, dissection, or vasculitis. Inflow: Segmental mild-to-moderate narrowing due to calcified atherosclerotic plaque. No evidence of aneurysm, dissection, or vasculitis. Proximal Outflow: Bilateral common femoral and visualized portions of the superficial and profunda femoral arteries are patent without evidence of aneurysm, dissection, vasculitis or significant stenosis. Veins: No obvious venous abnormality within the limitations of this arterial phase study. Review of the MIP images confirms the above findings. NON-VASCULAR Lower chest: Peripheral right lower lobe scarring. Trace right pleural effusion. Multichamber cardiomegaly. Coronary artery calcifications. Prior aortic valve replacement. Reflux of contrast material into the hepatic veins, suggesting a degree of right heart dysfunction. Hepatobiliary:  Cirrhotic morphology. Ill-defined hypoattenuation measuring 1.1 cm in segment 2/3 (9:31). No intra or extrahepatic biliary ductal dilation. Normal gallbladder. Pancreas: No focal lesions or main ductal dilation. Spleen: Normal in size without focal abnormality. Adrenals/Urinary Tract: No adrenal nodules. No suspicious renal mass, calculi or hydronephrosis. Urinary bladder is underdistended with catheter in-situ. Stomach/Bowel: Normal appearance of the stomach. No evidence of bowel  wall thickening, distention, or inflammatory changes. Appendix is not discretely seen. Lymphatic: No enlarged abdominal or pelvic lymph nodes. Reproductive: Enlargement of the prostate with median lobe hypertrophy. Other: Small volume ascites.  No free air or fluid collection. Musculoskeletal: No acute or abnormal lytic or blastic osseous lesions. Multilevel degenerative changes of the partially imaged thoracic and lumbar spine. Partially imaged median sternotomy wires are nondisplaced. Mild body wall edema. Asymmetrically enlarged left rectus abdominis muscle with a focus of active contrast extravasation (14:93). IMPRESSION: 1. No findings of active GI bleed. 2. Asymmetrically enlarged left rectus abdominis muscle with a focus of active contrast extravasation, consistent with intramuscular hematoma. 3. Cirrhotic morphology of the liver with ill-defined hypoattenuation measuring 1.1 cm in segment 2/3, which is indeterminate. Recommend nonemergent liver protocol MRI for further evaluation. 4. Small volume ascites. 5. Trace right pleural effusion. 6. Multichamber cardiomegaly with reflux of contrast material into the hepatic veins, suggesting a degree of right heart dysfunction. 7. Aortic Atherosclerosis (ICD10-I70.0). Coronary artery calcifications. Assessment for potential risk factor modification, dietary therapy or pharmacologic therapy may be warranted, if clinically indicated. Electronically Signed   By: Agustin Cree M.D.   On: 07/14/2023 20:34   IR Paracentesis  Result Date: 07/14/2023 INDICATION: 67 year old male. History of CHF with recurrent ascites. Request is for therapeutic paracentesis EXAM: ULTRASOUND GUIDED THERAPEUTIC LEFT-SIDED PARACENTESIS MEDICATIONS: lidocaine 1% 10 mL COMPLICATIONS: None immediate. PROCEDURE: Informed written consent was obtained from the patient after a discussion of the risks, benefits and alternatives to treatment. A timeout was performed prior to the initiation of the  procedure. Initial ultrasound scanning demonstrates a large amount of ascites within the left lower abdominal quadrant. The left lower abdomen was prepped and draped in the usual sterile fashion. 1% lidocaine was used for local anesthesia. Following this, a 19 gauge, 7-cm, Yueh catheter was introduced. An ultrasound image was saved for documentation purposes. The paracentesis was performed. The catheter was removed and a dressing was applied. The patient tolerated the procedure well without immediate post procedural complication. Patient received post-procedure intravenous albumin; see nursing notes for details. FINDINGS: A total of approximately 5.7 L of serosanguineous fluid was removed. IMPRESSION: Successful ultrasound-guided therapeutic LEFT paracentesis yielding 5.7 L of peritoneal fluid. Read by: Anders Grant, NP Electronically Signed   By: Roanna Banning M.D.   On: 07/14/2023 15:12        Scheduled Meds:  allopurinol  300 mg Oral Daily   insulin aspart  0-15 Units Subcutaneous Q4H   midodrine  10 mg Oral BID WC   mirtazapine  15 mg Oral QHS   pantoprazole (PROTONIX) IV  40 mg Intravenous Q12H   tamsulosin  0.4 mg Oral QHS   Continuous Infusions:  cefTRIAXone (ROCEPHIN)  IV     dextrose 5 % and 0.9 % NaCl 50 mL/hr at 07/15/23 0034   octreotide (SANDOSTATIN) 500 mcg in sodium chloride 0.9 % 250 mL (2 mcg/mL) infusion 50 mcg/hr (07/15/23 0602)     LOS: 1 day    Time spent: 35 minutes     Dorcas Carrow, MD [facility]

## 2023-07-15 NOTE — Progress Notes (Signed)
[facility] ROUNDING NOTE   Subjective: No further bowel movements overnight.  Patient received 1 unit PRBC, and hemoglobin has been stable since presentation.  BUN 43 down from 48. He continues to have low systolic blood pressures in the 80s-90s, which is his baseline as an outpatient.  A CTA performed in the ER showed no active bleeding, but did show evidence of a left rectus abdominis muscle hematoma with active contrast extravasation.  An ill-defined hypoattenuation in the liver was noted, 1.1 cm.  CT also notable for small volume ascites, trace right pleural effusion and multichamber cardiomegaly with reflux of contrast in the hepatic veins.     Objective: Vital signs in last 24 hours: Temp:  [97.4 F (36.3 C)-98.4 F (36.9 C)] 97.7 F (36.5 C) (09/21 0733) Pulse Rate:  [67-126] 71 (09/21 0733) Resp:  [12-19] 16 (09/21 0733) BP: (76-108)/(56-84) 82/63 (09/21 0733) SpO2:  [78 %-100 %] 99 % (09/21 0733) FiO2 (%):  [21 %] 21 % (09/21 0113) Weight:  [69 kg-75.8 kg] 69 kg (09/21 0416) Last BM Date : 07/14/23 General: NAD, weak, chronically ill-appearing African-American male Lungs:  CTA b/l, no w/r/r Heart:  RRR, no m/r/g Abdomen:  Soft, tenderness to palpation in left lower quadrant, with soft tissue firmness, ND, +BS Ext: Bilateral pitting edema    Intake/Output from previous day: 09/20 0701 - 09/21 0700 In: 1648 [Blood:474; IV Piggyback:1174] Out: 1700 [Urine:1700] Intake/Output this shift: Total I/O In: -  Out: 200 [Urine:200]   Lab Results: Recent Labs    07/14/23 1621 07/15/23 0150 07/15/23 0247 07/15/23 0754  WBC 4.5  --   --   --   HGB 9.5* 10.0* 9.5* 9.7*  PLT 339  --   --   --   MCV 89.9  --   --   --    BMET Recent Labs    07/14/23 1621 07/15/23 0150  NA 128* 128*  K 4.3 4.0  CL 97* 96*  CO2 24 20*  GLUCOSE 109* 127*  BUN 48* 43*  CREATININE 2.23* 2.04*  CALCIUM 8.2* 8.1*   LFT Recent Labs    07/14/23 1621 07/15/23 0150   PROT 7.5 6.4*  ALBUMIN 2.7* 2.7*  AST 43* 37  ALT 11 10  ALKPHOS 121 89  BILITOT 1.5* 1.4*   PT/INR Recent Labs    07/14/23 1621 07/15/23 0150  INR 2.1* 1.9*      Imaging/Other results: CT Angio Abd/Pel W and/or Wo Contrast  Result Date: 07/14/2023 CLINICAL DATA:  Melena EXAM: CTA ABDOMEN AND PELVIS WITHOUT AND WITH CONTRAST TECHNIQUE: Multidetector CT imaging of the abdomen and pelvis was performed using the standard protocol during bolus administration of intravenous contrast. Multiplanar reconstructed images and MIPs were obtained and reviewed to evaluate the vascular anatomy. RADIATION DOSE REDUCTION: This exam was performed according to the departmental dose-optimization program which includes automated exposure control, adjustment of the mA and/or kV according to patient size and/or use of iterative reconstruction technique. CONTRAST:  80mL OMNIPAQUE IOHEXOL 350 MG/ML SOLN COMPARISON:  MR abdomen dated 01/21/2023 FINDINGS: VASCULAR Aorta: Normal caliber aorta without aneurysm, dissection, vasculitis or significant stenosis. Celiac and SMA: Common origin of the celiac and SMA. Patent without evidence of aneurysm, dissection, vasculitis or significant stenosis. Renals: 1 right and 2 left renal arteries. Bilateral renal arteries are patent without evidence of aneurysm, dissection, vasculitis, fibromuscular dysplasia or significant stenosis. IMA: Severe narrowing of the IMA origin to calcified plaque due. No evidence of aneurysm, dissection, or vasculitis. Inflow:  Segmental mild-to-moderate narrowing due to calcified atherosclerotic plaque. No evidence of aneurysm, dissection, or vasculitis. Proximal Outflow: Bilateral common femoral and visualized portions of the superficial and profunda femoral arteries are patent without evidence of aneurysm, dissection, vasculitis or significant stenosis. Veins: No obvious venous abnormality within the limitations of this arterial phase study. Review of  the MIP images confirms the above findings. NON-VASCULAR Lower chest: Peripheral right lower lobe scarring. Trace right pleural effusion. Multichamber cardiomegaly. Coronary artery calcifications. Prior aortic valve replacement. Reflux of contrast material into the hepatic veins, suggesting a degree of right heart dysfunction. Hepatobiliary: Cirrhotic morphology. Ill-defined hypoattenuation measuring 1.1 cm in segment 2/3 (9:31). No intra or extrahepatic biliary ductal dilation. Normal gallbladder. Pancreas: No focal lesions or main ductal dilation. Spleen: Normal in size without focal abnormality. Adrenals/Urinary Tract: No adrenal nodules. No suspicious renal mass, calculi or hydronephrosis. Urinary bladder is underdistended with catheter in-situ. Stomach/Bowel: Normal appearance of the stomach. No evidence of bowel wall thickening, distention, or inflammatory changes. Appendix is not discretely seen. Lymphatic: No enlarged abdominal or pelvic lymph nodes. Reproductive: Enlargement of the prostate with median lobe hypertrophy. Other: Small volume ascites.  No free air or fluid collection. Musculoskeletal: No acute or abnormal lytic or blastic osseous lesions. Multilevel degenerative changes of the partially imaged thoracic and lumbar spine. Partially imaged median sternotomy wires are nondisplaced. Mild body wall edema. Asymmetrically enlarged left rectus abdominis muscle with a focus of active contrast extravasation (14:93). IMPRESSION: 1. No findings of active GI bleed. 2. Asymmetrically enlarged left rectus abdominis muscle with a focus of active contrast extravasation, consistent with intramuscular hematoma. 3. Cirrhotic morphology of the liver with ill-defined hypoattenuation measuring 1.1 cm in segment 2/3, which is indeterminate. Recommend nonemergent liver protocol MRI for further evaluation. 4. Small volume ascites. 5. Trace right pleural effusion. 6. Multichamber cardiomegaly with reflux of contrast  material into the hepatic veins, suggesting a degree of right heart dysfunction. 7. Aortic Atherosclerosis (ICD10-I70.0). Coronary artery calcifications. Assessment for potential risk factor modification, dietary therapy or pharmacologic therapy may be warranted, if clinically indicated. Electronically Signed   By: Agustin Cree M.D.   On: 07/14/2023 20:34   IR Paracentesis  Result Date: 07/14/2023 INDICATION: 67 year old male. History of CHF with recurrent ascites. Request is for therapeutic paracentesis EXAM: ULTRASOUND GUIDED THERAPEUTIC LEFT-SIDED PARACENTESIS MEDICATIONS: lidocaine 1% 10 mL COMPLICATIONS: None immediate. PROCEDURE: Informed written consent was obtained from the patient after a discussion of the risks, benefits and alternatives to treatment. A timeout was performed prior to the initiation of the procedure. Initial ultrasound scanning demonstrates a large amount of ascites within the left lower abdominal quadrant. The left lower abdomen was prepped and draped in the usual sterile fashion. 1% lidocaine was used for local anesthesia. Following this, a 19 gauge, 7-cm, Yueh catheter was introduced. An ultrasound image was saved for documentation purposes. The paracentesis was performed. The catheter was removed and a dressing was applied. The patient tolerated the procedure well without immediate post procedural complication. Patient received post-procedure intravenous albumin; see nursing notes for details. FINDINGS: A total of approximately 5.7 L of serosanguineous fluid was removed. IMPRESSION: Successful ultrasound-guided therapeutic LEFT paracentesis yielding 5.7 L of peritoneal fluid. Read by: Anders Grant, NP Electronically Signed   By: Roanna Banning M.D.   On: 07/14/2023 15:12    MELD 3.0: 28 at 07/15/2023  1:50 AM MELD-Na: 27 at 07/15/2023  1:50 AM Calculated from: Serum Creatinine: 2.04 mg/dL at 1/61/0960  4:54 AM Serum Sodium: 128 mmol/L at  07/15/2023  1:50 AM Total Bilirubin: 1.4  mg/dL at 5/63/8756  4:33 AM Serum Albumin: 2.7 g/dL at 2/95/1884  1:66 AM INR(ratio): 1.9 at 07/15/2023  1:50 AM Age at listing (hypothetical): 57 years Sex: Male at 07/15/2023  1:50 AM   Assessment and Plan:  67 year old male with end-stage cardiac amyloidosis, with palliative care referral, history of atrial fibrillation status post ablation, coronary artery disease status post CABG and cuspid aortic status post bovine AVR, with presumed cirrhosis of unclear etiology presenting with 3 to 4 days of melenic stools, 3 point drop in hemoglobin from baseline and hypotension.  BUN not significantly elevated above baseline.   Although he has a diagnosis of cirrhosis, he does not appear to have significant portal hypertension based on his most recent abdominal imaging in April and normal platelets.  His INR is elevated, but this is confounded by his Xarelto use.   CTA with abdominal wall hematoma, secondary to recent paracentesis.  This may be contributing to some of his anemia, but obviously would not explain his melena.  His hemoglobin has been stable, his BUN downtrending and he has not had further overt bleeding since admission.  His blood pressure is at his baseline. His bleeding appears to have slowed or possibly stopped.  Given his significant cardiac disease, he is at very high risk for complications from sedation.  If we do not need to perform an EGD for hemostasis, I would recommend against performing a diagnostic EGD. Recommend continued supportive care with empiric octreotide, antibiotics and PPI.  Okay to advance diet to liquids today per patient request.  Will hold off on EGD, unless patient continues to show evidence of active GI bleeding.    Melena, presumed upper GI bleed - Continue octreotide drip empirically (no known history of varices) - Protonix twice daily IV - Ceftriaxone for prophylaxis in cirrhotic with GI bleed -Hold off on EGD for now given improvement/cessation of bleeding  and very high risk of cardiovascular complications from sedation  - Continue to hold Xarelto - Okay to advance to full liquid diet today  Relative hypotension - Patient's baseline systolic blood pressures in the 80s based on multiple outpatient visits.  This is likely secondary to his severe cardiomyopathy and cirrhosis.  No intervention is needed for systolics in the 80s.  Rectal hematoma - Does not appear to be progressing, bleeding has ceased - Heating pad/warm compresses may help with discomfort and hematoma resolution  Cirrhosis with ascites, unclear etiology, possibly related to hepatic congestion versus amyloidosis versus MASH, no history of encephalopathy, varices or GI bleeding Ascites may be multifactorial (cardiogenic) - We do not have a true SAAG -High MELD, but mostly driven by renal insufficiency and INR which is influenced by Xarelto -No evidence of portal hypertension on imaging (varices, splenomegaly), normal platelets  GI will continue to follow with you  Jenel Lucks, MD  07/15/2023, 9:55 AM [facility]

## 2023-07-16 DIAGNOSIS — K769 Liver disease, unspecified: Secondary | ICD-10-CM

## 2023-07-16 DIAGNOSIS — K922 Gastrointestinal hemorrhage, unspecified: Secondary | ICD-10-CM

## 2023-07-16 DIAGNOSIS — I4891 Unspecified atrial fibrillation: Secondary | ICD-10-CM | POA: Diagnosis not present

## 2023-07-16 DIAGNOSIS — E854 Organ-limited amyloidosis: Secondary | ICD-10-CM | POA: Diagnosis not present

## 2023-07-16 DIAGNOSIS — Z7901 Long term (current) use of anticoagulants: Secondary | ICD-10-CM | POA: Diagnosis not present

## 2023-07-16 LAB — HEMOGLOBIN AND HEMATOCRIT, BLOOD
HCT: 30.8 % — ABNORMAL LOW (ref 39.0–52.0)
Hemoglobin: 10.3 g/dL — ABNORMAL LOW (ref 13.0–17.0)

## 2023-07-16 MED ORDER — DEXTROSE 50 % IV SOLN
1.0000 | Freq: Once | INTRAVENOUS | Status: AC
  Administered 2023-07-16: 50 mL via INTRAVENOUS

## 2023-07-16 MED ORDER — DEXTROSE 50 % IV SOLN
INTRAVENOUS | Status: AC
  Filled 2023-07-16: qty 50

## 2023-07-16 MED ORDER — NON FORMULARY: 61.0000 mg | Freq: Every day | Status: DC

## 2023-07-16 MED ORDER — TADALAFIL 20 MG PO TABS: 20.0000 mg | ORAL_TABLET | Freq: Every day | ORAL | Status: DC

## 2023-07-16 MED ORDER — TAFAMIDIS 61 MG PO CAPS
61.0000 mg | ORAL_CAPSULE | Freq: Every day | ORAL | Status: DC
  Administered 2023-07-16 – 2023-07-17 (×2): 61 mg via ORAL
  Filled 2023-07-16 (×2): qty 1

## 2023-07-16 NOTE — Plan of Care (Signed)

## 2023-07-16 NOTE — Progress Notes (Signed)
10cc removed from balloon port of pre-existing 65fr indwelling foley catheter. Patient was prepped using aseptic technique. 18 fr foley catheter inserted to the hub- immediate return of clear, yellow urine with small red flecks.  25 cc of sterile water inserted into balloon port (resistance met when trying to instill 30cc.)

## 2023-07-16 NOTE — Progress Notes (Signed)
GASTROENTEROLOGY ROUNDING NOTE   Subjective: No further evidence of GI bleeding.  Patient has not had a bowel movement in 2 days.  Hemoglobin stable at 10 this morning, CMP still pending.  Patient is hungry and would like to eat. Patient states that abdominal wall hematoma is itchy, but minimally painful.  MRI of the liver showed a 1.3 hypovascular lesion, LI-RADS 3 recommended repeat MRI 6 months   Objective: Vital signs in last 24 hours: Temp:  [97.1 F (36.2 C)-98.4 F (36.9 C)] 97.7 F (36.5 C) (09/22 0735) Pulse Rate:  [70-86] 78 (09/22 0735) Resp:  [16-19] 19 (09/22 0735) BP: (86-102)/(62-83) 102/83 (09/22 0735) SpO2:  [94 %-99 %] 94 % (09/22 0735) Weight:  [72.5 kg] 72.5 kg (09/22 0418) Last BM Date : 07/14/23 General: NAD, weak, chronically ill appearing African-American male Lungs:  CTA b/l, no w/r/r Heart:  RRR, no m/r/g Abdomen:  Soft, NT, distended with ascites, +BS, less tenderness to palpation around paracentesis site today compared to yesterday    Intake/Output from previous day: 09/21 0701 - 09/22 0700 In: 3618.6 [P.O.:1320; I.V.:2198.6; IV Piggyback:100] Out: 1050 [Urine:400] Intake/Output this shift: No intake/output data recorded.   Lab Results: Recent Labs    07/14/23 1621 07/15/23 0150 07/15/23 1741 07/15/23 2108 07/16/23 0250  WBC 4.5  --   --   --   --   HGB 9.5*   < > 11.4* 10.2* 10.3*  PLT 339  --   --   --   --   MCV 89.9  --   --   --   --    < > = values in this interval not displayed.   BMET Recent Labs    07/14/23 1621 07/15/23 0150  NA 128* 128*  K 4.3 4.0  CL 97* 96*  CO2 24 20*  GLUCOSE 109* 127*  BUN 48* 43*  CREATININE 2.23* 2.04*  CALCIUM 8.2* 8.1*   LFT Recent Labs    07/14/23 1621 07/15/23 0150  PROT 7.5 6.4*  ALBUMIN 2.7* 2.7*  AST 43* 37  ALT 11 10  ALKPHOS 121 89  BILITOT 1.5* 1.4*   PT/INR Recent Labs    07/14/23 1621 07/15/23 0150  INR 2.1* 1.9*      Imaging/Other results: MR  LIVER W WO CONTRAST  Result Date: 07/15/2023 CLINICAL DATA:  Cirrhosis. Indeterminate left hepatic lobe lesion on recent CT. EXAM: MRI ABDOMEN WITHOUT AND WITH CONTRAST TECHNIQUE: Multiplanar multisequence MR imaging of the abdomen was performed both before and after the administration of intravenous contrast. CONTRAST:  6.53mL GADAVIST GADOBUTROL 1 MMOL/ML IV SOLN COMPARISON:  CT on 07/14/2023 and MRCP on 01/21/2019 FINDINGS: Lower chest: Small right and tiny left pleural effusions. Hepatobiliary: Hepatic cirrhosis noted. A small T2 hypointense lesion is seen in segment 2 of the left lobe on image 18/5, measuring 1.3 cm. This lesion is hypovascular on dynamic imaging, without evidence of arterial phase hyperenhancement or contrast washout. These characteristics are consistent with a LI-RADS 3 lesion. No other focal liver lesions are identified. Gallbladder is unremarkable. No evidence of biliary ductal dilatation. Large amount of ascites is seen. Pancreas:  No mass or inflammatory changes. Spleen:  Within normal limits in size and appearance. Adrenals/Urinary Tract: No suspicious masses identified. No evidence of hydronephrosis. Stomach/Bowel: Unremarkable. Vascular/Lymphatic: No pathologically enlarged lymph nodes identified. No acute vascular findings. Other:  None. Musculoskeletal:  No suspicious bone lesions identified. IMPRESSION: Hepatic cirrhosis.  Large amount of ascites. 1.3 cm hypovascular lesion in segment 2  of the left hepatic lobe. (LI-RADS Category 3: Intermediate probability of malignancy) Recommend continued follow-up with MRI in 6 months. Small right and tiny left pleural effusions. Electronically Signed   By: Danae Orleans M.D.   On: 07/15/2023 15:49   CT Angio Abd/Pel W and/or Wo Contrast  Result Date: 07/14/2023 CLINICAL DATA:  Melena EXAM: CTA ABDOMEN AND PELVIS WITHOUT AND WITH CONTRAST TECHNIQUE: Multidetector CT imaging of the abdomen and pelvis was performed using the standard protocol  during bolus administration of intravenous contrast. Multiplanar reconstructed images and MIPs were obtained and reviewed to evaluate the vascular anatomy. RADIATION DOSE REDUCTION: This exam was performed according to the departmental dose-optimization program which includes automated exposure control, adjustment of the mA and/or kV according to patient size and/or use of iterative reconstruction technique. CONTRAST:  80mL OMNIPAQUE IOHEXOL 350 MG/ML SOLN COMPARISON:  MR abdomen dated 01/21/2023 FINDINGS: VASCULAR Aorta: Normal caliber aorta without aneurysm, dissection, vasculitis or significant stenosis. Celiac and SMA: Common origin of the celiac and SMA. Patent without evidence of aneurysm, dissection, vasculitis or significant stenosis. Renals: 1 right and 2 left renal arteries. Bilateral renal arteries are patent without evidence of aneurysm, dissection, vasculitis, fibromuscular dysplasia or significant stenosis. IMA: Severe narrowing of the IMA origin to calcified plaque due. No evidence of aneurysm, dissection, or vasculitis. Inflow: Segmental mild-to-moderate narrowing due to calcified atherosclerotic plaque. No evidence of aneurysm, dissection, or vasculitis. Proximal Outflow: Bilateral common femoral and visualized portions of the superficial and profunda femoral arteries are patent without evidence of aneurysm, dissection, vasculitis or significant stenosis. Veins: No obvious venous abnormality within the limitations of this arterial phase study. Review of the MIP images confirms the above findings. NON-VASCULAR Lower chest: Peripheral right lower lobe scarring. Trace right pleural effusion. Multichamber cardiomegaly. Coronary artery calcifications. Prior aortic valve replacement. Reflux of contrast material into the hepatic veins, suggesting a degree of right heart dysfunction. Hepatobiliary: Cirrhotic morphology. Ill-defined hypoattenuation measuring 1.1 cm in segment 2/3 (9:31). No intra or  extrahepatic biliary ductal dilation. Normal gallbladder. Pancreas: No focal lesions or main ductal dilation. Spleen: Normal in size without focal abnormality. Adrenals/Urinary Tract: No adrenal nodules. No suspicious renal mass, calculi or hydronephrosis. Urinary bladder is underdistended with catheter in-situ. Stomach/Bowel: Normal appearance of the stomach. No evidence of bowel wall thickening, distention, or inflammatory changes. Appendix is not discretely seen. Lymphatic: No enlarged abdominal or pelvic lymph nodes. Reproductive: Enlargement of the prostate with median lobe hypertrophy. Other: Small volume ascites.  No free air or fluid collection. Musculoskeletal: No acute or abnormal lytic or blastic osseous lesions. Multilevel degenerative changes of the partially imaged thoracic and lumbar spine. Partially imaged median sternotomy wires are nondisplaced. Mild body wall edema. Asymmetrically enlarged left rectus abdominis muscle with a focus of active contrast extravasation (14:93). IMPRESSION: 1. No findings of active GI bleed. 2. Asymmetrically enlarged left rectus abdominis muscle with a focus of active contrast extravasation, consistent with intramuscular hematoma. 3. Cirrhotic morphology of the liver with ill-defined hypoattenuation measuring 1.1 cm in segment 2/3, which is indeterminate. Recommend nonemergent liver protocol MRI for further evaluation. 4. Small volume ascites. 5. Trace right pleural effusion. 6. Multichamber cardiomegaly with reflux of contrast material into the hepatic veins, suggesting a degree of right heart dysfunction. 7. Aortic Atherosclerosis (ICD10-I70.0). Coronary artery calcifications. Assessment for potential risk factor modification, dietary therapy or pharmacologic therapy may be warranted, if clinically indicated. Electronically Signed   By: Agustin Cree M.D.   On: 07/14/2023 20:34   IR Paracentesis  Result Date: 07/14/2023 INDICATION: 67 year old male. History of CHF  with recurrent ascites. Request is for therapeutic paracentesis EXAM: ULTRASOUND GUIDED THERAPEUTIC LEFT-SIDED PARACENTESIS MEDICATIONS: lidocaine 1% 10 mL COMPLICATIONS: None immediate. PROCEDURE: Informed written consent was obtained from the patient after a discussion of the risks, benefits and alternatives to treatment. A timeout was performed prior to the initiation of the procedure. Initial ultrasound scanning demonstrates a large amount of ascites within the left lower abdominal quadrant. The left lower abdomen was prepped and draped in the usual sterile fashion. 1% lidocaine was used for local anesthesia. Following this, a 19 gauge, 7-cm, Yueh catheter was introduced. An ultrasound image was saved for documentation purposes. The paracentesis was performed. The catheter was removed and a dressing was applied. The patient tolerated the procedure well without immediate post procedural complication. Patient received post-procedure intravenous albumin; see nursing notes for details. FINDINGS: A total of approximately 5.7 L of serosanguineous fluid was removed. IMPRESSION: Successful ultrasound-guided therapeutic LEFT paracentesis yielding 5.7 L of peritoneal fluid. Read by: Anders Grant, NP Electronically Signed   By: Roanna Banning M.D.   On: 07/14/2023 15:12      Assessment and Plan:  67 year old male with end-stage cardiac amyloidosis, with palliative care referral, history of atrial fibrillation status post ablation, coronary artery disease status post CABG and cuspid aortic status post bovine AVR, with presumed cirrhosis of unclear etiology presenting with 3 to 4 days of melenic stools, 3 point drop in hemoglobin from baseline and hypotension.  BUN not significantly elevated above baseline.   Although he has a diagnosis of cirrhosis, he does not appear to have significant portal hypertension based on his most recent abdominal imaging in April and normal platelets.  His INR is elevated, but this is  confounded by his Xarelto use. His bleeding appears to have resolved spontaneously with medical therapy.  Melena, presumed upper GI bleed - Stop octreotide drip - Protonix twice daily IV, okay to transition to p.o. recommend treating twice daily for 8 weeks empirically, then decreasing to once daily - Ceftriaxone for prophylaxis in cirrhotic with GI bleed, last dose today - Continue to hold off on EGD for now given improvement/cessation of bleeding and very high risk of cardiovascular complications from sedation  - Continue to hold Xarelto - Okay to advance to regular diet today - Patient should be able to be discharged home tomorrow from GI standpoint and can restart Xarelto at that time   Relative hypotension - Patient's baseline systolic blood pressures in the 80s based on multiple outpatient visits.  This is likely secondary to his severe cardiomyopathy and cirrhosis.  No intervention is needed for systolics in the 80s.   Rectal hematoma - Does not appear to be progressing, bleeding has ceased - Heating pad/warm compresses may help with discomfort and hematoma resolution   Cirrhosis with ascites, unclear etiology, possibly related to hepatic congestion versus amyloidosis versus MASH, no history of encephalopathy, varices or GI bleeding Ascites may be multifactorial (cardiogenic) - We do not have a true SAAG -High MELD, but mostly driven by renal insufficiency and INR which is influenced by Xarelto -No evidence of portal hypertension on imaging (varices, splenomegaly), normal platelets - Patient has significant reaccumulation of ascites since admission, likely related to fluid resuscitation.  He may need repeat paracentesis prior to discharge if he is not able to get outpatient paracentesis scheduled.  Indeterminate liver lesion, LI-RADS 3 - Repeat MRI recommended in 6 months.  We will arrange for this, but given patient's  significant comorbidities, unclear if [facility] surveillance necessary  (patient unlikely to tolerate any treatment for HCC) - Will obtain AFP  GI will sign off at this time.  Please reconsult if there are additional questions.   Jenel Lucks, MD  07/16/2023, 9:54 AM [facility]

## 2023-07-16 NOTE — Progress Notes (Signed)
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  Patient was complaining of black tarry stool for the last 4 days.  More frequently lightheaded and dizziness over the past week.  In the emergency room blood pressures were borderline low with systolic blood pressure 90.  Hemoglobin 9.5, recent baseline of 12.  Heme positive.  Transfusion ordered and patient was admitted to the hospital with GI consultation.   Assessment & Plan:   Symptomatic anemia, suspected upper GI bleeding: Received 1 unit of PRBC.  Hemoglobin has remained stable since then.  Xarelto on hold. Currently on octreotide infusion, discontinuing today. On Protonix IV, will change to Protonix oral on discharge tomorrow. Will continue Rocephin today. No clinical evidence of ongoing bleeding.  Continue to monitor every 12 hours.  Left rectus abdominis intramuscular hematoma: Due to paracentesis.  Continue to monitor.  Local therapies.  Does not have evidence of ongoing bleeding.  Hyponatremia: Dilutional hyponatremia.  Monitor.  Stable.  Chronic atrial fibrillation: In and out of A-fib.  On Xarelto.  On hold due to suspected bleeding.  Rate controlled.  End-stage liver disease, liver cirrhosis: Chronic hypotension.  Patient on midodrine. MRI of the liver with 1.3 cm hypoechoic mass.  Outpatient MRI recommended for surveillance.  GI will schedule follow-up.  Ischemic cardiomyopathy/cardiac amyloidosis/heart failure with reduced ejection fraction: Demadex on hold.  Midodrine resumed.  No ACE or beta-blockers due to hypotension.  On palliative care at  home.  Chronic urinary retention /BPH: On Flomax.  Unknown last Foley catheter changed.  His urine is turbid and has discomfort.  Will change Foley catheter today.  Type 2 diabetes: On sliding scale insulin.  Obstructive sleep apnea: On CPAP.   DVT prophylaxis: SCDs Start: 07/14/23 2234   Code Status: DNR comfort care Family Communication: None at the bedside Disposition Plan: Status is: Inpatient Remains inpatient appropriate because: IV fluid, IV infusions     Consultants:  GI  Procedures:  None  Antimicrobials:  Rocephin 9/20---   Subjective:  Patient seen and examined.  Denies any nausea vomiting.  He does have some abdominal discomfort and itchiness on the left side.  No bowel movement since admission.  Passing flatus.  Objective: Vitals:   07/15/23 1937 07/15/23 2358 07/16/23 0418 07/16/23 0735  BP: (!) 86/67 (!) 87/63 97/76 102/83  Pulse: 81 76 70 78  Resp:  18 18 19   Temp: 98.4 F (36.9 C) 97.7 F (36.5 C) 97.7 F (36.5 C) 97.7 F (36.5 C)  TempSrc: Oral Oral Axillary Oral  SpO2: 99% 96% 99% 94%  Weight:   72.5 kg   Height:        Intake/Output Summary (Last 24 hours) at 07/16/2023 1104 Last data filed at 07/16/2023 8119 Gross per 24 hour  Intake 3618.64 ml  Output 850 ml  Net 2768.64 ml   Filed Weights   07/14/23 1611 07/15/23 0416 07/16/23 0418  Weight: 75.8 kg 69 kg 72.5 kg    Examination:  General: Chronically sick looking.  Thin and frail.  Very debilitated. Cardiovascular: S1-S2 normal.  Regular rate rhythm. Respiratory: Bilateral clear.  Poor inspiratory effort. Gastrointestinal: Soft.  Distended.  Mildly tender all over the  abdomen. no rigidity or guarding.  Bowel sound present.  Mild swelling on the left lateral quadrant without fluctuation. Ext: No swelling or edema.  No cyanosis. Neuro: Alert and awake.  Generalized weakness.  No focal deficits. Foley catheter with turbid urine.   Data Reviewed: I have personally reviewed  following labs and imaging studies  CBC: Recent Labs  Lab 07/14/23 1621 07/15/23 0150 07/15/23 0247 07/15/23 0754 07/15/23 1741 07/15/23 2108 07/16/23 0250  WBC 4.5  --   --   --   --   --   --   HGB 9.5*   < > 9.5* 9.7* 11.4* 10.2* 10.3*  HCT 29.3*   < > 28.8* 28.8* 35.1* 31.0* 30.8*  MCV 89.9  --   --   --   --   --   --   PLT 339  --   --   --   --   --   --    < > = values in this interval not displayed.   Basic Metabolic Panel: Recent Labs  Lab 07/14/23 1621 07/15/23 0150  NA 128* 128*  K 4.3 4.0  CL 97* 96*  CO2 24 20*  GLUCOSE 109* 127*  BUN 48* 43*  CREATININE 2.23* 2.04*  CALCIUM 8.2* 8.1*   GFR: Estimated Creatinine Clearance: 35 mL/min (A) (by C-G formula based on SCr of 2.04 mg/dL (H)). Liver Function Tests: Recent Labs  Lab 07/14/23 1621 07/15/23 0150  AST 43* 37  ALT 11 10  ALKPHOS 121 89  BILITOT 1.5* 1.4*  PROT 7.5 6.4*  ALBUMIN 2.7* 2.7*   No results for input(s): "LIPASE", "AMYLASE" in the last 168 hours. Recent Labs  Lab 07/15/23 0150  AMMONIA 21   Coagulation Profile: Recent Labs  Lab 07/14/23 1621 07/15/23 0150  INR 2.1* 1.9*   Cardiac Enzymes: No results for input(s): "CKTOTAL", "CKMB", "CKMBINDEX", "TROPONINI" in the last 168 hours. BNP (last 3 results) No results for input(s): "PROBNP" in the last 8760 hours. HbA1C: Recent Labs    07/15/23 0150  HGBA1C 5.8*   CBG: Recent Labs  Lab 07/14/23 2219  GLUCAP 78   Lipid Profile: No results for input(s): "CHOL", "HDL", "LDLCALC", "TRIG", "CHOLHDL", "LDLDIRECT" in the last 72 hours. Thyroid Function Tests: No results for input(s): "TSH", "T4TOTAL", "FREET4", "T3FREE", "THYROIDAB" in the last 72 hours. Anemia Panel: No results for input(s): "VITAMINB12", "FOLATE", "FERRITIN", "TIBC", "IRON", "RETICCTPCT" in the last 72 hours. Sepsis Labs: Recent Labs  Lab 07/14/23 1702  LATICACIDVEN 1.5    No results found for this or any previous visit (from the past 240 hour(s)).        Radiology Studies: MR LIVER W WO CONTRAST  Result Date: 07/15/2023 CLINICAL DATA:  Cirrhosis. Indeterminate left hepatic lobe lesion on recent CT. EXAM: MRI ABDOMEN WITHOUT AND WITH CONTRAST TECHNIQUE: Multiplanar multisequence MR imaging of the abdomen was performed both before and after the administration of intravenous contrast. CONTRAST:  6.47mL GADAVIST GADOBUTROL 1 MMOL/ML IV SOLN COMPARISON:  CT on 07/14/2023 and MRCP on 01/21/2019 FINDINGS: Lower chest: Small right and tiny left pleural effusions. Hepatobiliary: Hepatic cirrhosis noted. A small T2 hypointense lesion is seen in segment 2 of the left lobe on image 18/5, measuring 1.3 cm. This lesion is hypovascular on dynamic imaging, without evidence of arterial phase hyperenhancement or contrast washout. These characteristics are consistent with a LI-RADS 3 lesion. No other focal liver lesions are identified. Gallbladder is unremarkable. No evidence of biliary ductal dilatation. Large amount of ascites  is seen. Pancreas:  No mass or inflammatory changes. Spleen:  Within normal limits in size and appearance. Adrenals/Urinary Tract: No suspicious masses identified. No evidence of hydronephrosis. Stomach/Bowel: Unremarkable. Vascular/Lymphatic: No pathologically enlarged lymph nodes identified. No acute vascular findings. Other:  None. Musculoskeletal:  No suspicious bone lesions identified. IMPRESSION: Hepatic cirrhosis.  Large amount of ascites. 1.3 cm hypovascular lesion in segment 2 of the left hepatic lobe. (LI-RADS Category 3: Intermediate probability of malignancy) Recommend continued follow-up with MRI in 6 months. Small right and tiny left pleural effusions. Electronically Signed   By: Danae Orleans M.D.   On: 07/15/2023 15:49   CT Angio Abd/Pel W and/or Wo Contrast  Result Date: 07/14/2023 CLINICAL DATA:  Melena EXAM: CTA ABDOMEN AND PELVIS WITHOUT AND WITH CONTRAST TECHNIQUE: Multidetector CT imaging of the abdomen and pelvis was  performed using the standard protocol during bolus administration of intravenous contrast. Multiplanar reconstructed images and MIPs were obtained and reviewed to evaluate the vascular anatomy. RADIATION DOSE REDUCTION: This exam was performed according to the departmental dose-optimization program which includes automated exposure control, adjustment of the mA and/or kV according to patient size and/or use of iterative reconstruction technique. CONTRAST:  80mL OMNIPAQUE IOHEXOL 350 MG/ML SOLN COMPARISON:  MR abdomen dated 01/21/2023 FINDINGS: VASCULAR Aorta: Normal caliber aorta without aneurysm, dissection, vasculitis or significant stenosis. Celiac and SMA: Common origin of the celiac and SMA. Patent without evidence of aneurysm, dissection, vasculitis or significant stenosis. Renals: 1 right and 2 left renal arteries. Bilateral renal arteries are patent without evidence of aneurysm, dissection, vasculitis, fibromuscular dysplasia or significant stenosis. IMA: Severe narrowing of the IMA origin to calcified plaque due. No evidence of aneurysm, dissection, or vasculitis. Inflow: Segmental mild-to-moderate narrowing due to calcified atherosclerotic plaque. No evidence of aneurysm, dissection, or vasculitis. Proximal Outflow: Bilateral common femoral and visualized portions of the superficial and profunda femoral arteries are patent without evidence of aneurysm, dissection, vasculitis or significant stenosis. Veins: No obvious venous abnormality within the limitations of this arterial phase study. Review of the MIP images confirms the above findings. NON-VASCULAR Lower chest: Peripheral right lower lobe scarring. Trace right pleural effusion. Multichamber cardiomegaly. Coronary artery calcifications. Prior aortic valve replacement. Reflux of contrast material into the hepatic veins, suggesting a degree of right heart dysfunction. Hepatobiliary: Cirrhotic morphology. Ill-defined hypoattenuation measuring 1.1 cm in  segment 2/3 (9:31). No intra or extrahepatic biliary ductal dilation. Normal gallbladder. Pancreas: No focal lesions or main ductal dilation. Spleen: Normal in size without focal abnormality. Adrenals/Urinary Tract: No adrenal nodules. No suspicious renal mass, calculi or hydronephrosis. Urinary bladder is underdistended with catheter in-situ. Stomach/Bowel: Normal appearance of the stomach. No evidence of bowel wall thickening, distention, or inflammatory changes. Appendix is not discretely seen. Lymphatic: No enlarged abdominal or pelvic lymph nodes. Reproductive: Enlargement of the prostate with median lobe hypertrophy. Other: Small volume ascites.  No free air or fluid collection. Musculoskeletal: No acute or abnormal lytic or blastic osseous lesions. Multilevel degenerative changes of the partially imaged thoracic and lumbar spine. Partially imaged median sternotomy wires are nondisplaced. Mild body wall edema. Asymmetrically enlarged left rectus abdominis muscle with a focus of active contrast extravasation (14:93). IMPRESSION: 1. No findings of active GI bleed. 2. Asymmetrically enlarged left rectus abdominis muscle with a focus of active contrast extravasation, consistent with intramuscular hematoma. 3. Cirrhotic morphology of the liver with ill-defined hypoattenuation measuring 1.1 cm in segment 2/3, which is indeterminate. Recommend nonemergent liver protocol MRI for further evaluation. 4. Small volume ascites. 5.  Trace right pleural effusion. 6. Multichamber cardiomegaly with reflux of contrast material into the hepatic veins, suggesting a degree of right heart dysfunction. 7. Aortic Atherosclerosis (ICD10-I70.0). Coronary artery calcifications. Assessment for potential risk factor modification, dietary therapy or pharmacologic therapy may be warranted, if clinically indicated. Electronically Signed   By: Agustin Cree M.D.   On: 07/14/2023 20:34   IR Paracentesis  Result Date: 07/14/2023 INDICATION:  67 year old male. History of CHF with recurrent ascites. Request is for therapeutic paracentesis EXAM: ULTRASOUND GUIDED THERAPEUTIC LEFT-SIDED PARACENTESIS MEDICATIONS: lidocaine 1% 10 mL COMPLICATIONS: None immediate. PROCEDURE: Informed written consent was obtained from the patient after a discussion of the risks, benefits and alternatives to treatment. A timeout was performed prior to the initiation of the procedure. Initial ultrasound scanning demonstrates a large amount of ascites within the left lower abdominal quadrant. The left lower abdomen was prepped and draped in the usual sterile fashion. 1% lidocaine was used for local anesthesia. Following this, a 19 gauge, 7-cm, Yueh catheter was introduced. An ultrasound image was saved for documentation purposes. The paracentesis was performed. The catheter was removed and a dressing was applied. The patient tolerated the procedure well without immediate post procedural complication. Patient received post-procedure intravenous albumin; see nursing notes for details. FINDINGS: A total of approximately 5.7 L of serosanguineous fluid was removed. IMPRESSION: Successful ultrasound-guided therapeutic LEFT paracentesis yielding 5.7 L of peritoneal fluid. Read by: Anders Grant, NP Electronically Signed   By: Roanna Banning M.D.   On: 07/14/2023 15:12        Scheduled Meds:  allopurinol  300 mg Oral Daily   insulin aspart  0-15 Units Subcutaneous Q4H   midodrine  10 mg Oral BID WC   mirtazapine  15 mg Oral QHS   pantoprazole (PROTONIX) IV  40 mg Intravenous Q12H   tamsulosin  0.4 mg Oral QHS   Continuous Infusions:  cefTRIAXone (ROCEPHIN)  IV Stopped (07/15/23 2231)   dextrose 5 % and 0.9 % NaCl 50 mL/hr at 07/16/23 0608     LOS: 2 days    Time spent: 35 minutes     Dorcas Carrow, MD [facility]

## 2023-07-17 ENCOUNTER — Ambulatory Visit (HOSPITAL_COMMUNITY): Payer: Medicare HMO

## 2023-07-17 ENCOUNTER — Inpatient Hospital Stay (HOSPITAL_COMMUNITY): Payer: Medicare HMO

## 2023-07-17 DIAGNOSIS — K922 Gastrointestinal hemorrhage, unspecified: Secondary | ICD-10-CM | POA: Diagnosis not present

## 2023-07-17 DIAGNOSIS — I43 Cardiomyopathy in diseases classified elsewhere: Secondary | ICD-10-CM | POA: Diagnosis not present

## 2023-07-17 DIAGNOSIS — E854 Organ-limited amyloidosis: Secondary | ICD-10-CM | POA: Diagnosis not present

## 2023-07-17 HISTORY — PX: IR PARACENTESIS: IMG2679

## 2023-07-17 LAB — COMPREHENSIVE METABOLIC PANEL
ALT: 8 U/L (ref 0–44)
AST: 29 U/L (ref 15–41)
Albumin: 2.3 g/dL — ABNORMAL LOW (ref 3.5–5.0)
Alkaline Phosphatase: 89 U/L (ref 38–126)
Anion gap: 4 — ABNORMAL LOW (ref 5–15)
BUN: 33 mg/dL — ABNORMAL HIGH (ref 8–23)
CO2: 23 mmol/L (ref 22–32)
Calcium: 8.1 mg/dL — ABNORMAL LOW (ref 8.9–10.3)
Chloride: 101 mmol/L (ref 98–111)
Creatinine, Ser: 1.89 mg/dL — ABNORMAL HIGH (ref 0.61–1.24)
GFR, Estimated: 38 mL/min — ABNORMAL LOW (ref >60)
Glucose, Bld: 99 mg/dL (ref 70–99)
Potassium: 5.3 mmol/L — ABNORMAL HIGH (ref 3.5–5.1)
Sodium: 128 mmol/L — ABNORMAL LOW (ref 135–145)
Total Bilirubin: 1.2 mg/dL (ref 0.3–1.2)
Total Protein: 6.1 g/dL — ABNORMAL LOW (ref 6.5–8.1)

## 2023-07-17 LAB — GLUCOSE, CAPILLARY
Glucose-Capillary: 101 mg/dL — ABNORMAL HIGH (ref 70–99)
Glucose-Capillary: 103 mg/dL — ABNORMAL HIGH (ref 70–99)
Glucose-Capillary: 105 mg/dL — ABNORMAL HIGH (ref 70–99)
Glucose-Capillary: 106 mg/dL — ABNORMAL HIGH (ref 70–99)
Glucose-Capillary: 110 mg/dL — ABNORMAL HIGH (ref 70–99)
Glucose-Capillary: 112 mg/dL — ABNORMAL HIGH (ref 70–99)
Glucose-Capillary: 130 mg/dL — ABNORMAL HIGH (ref 70–99)
Glucose-Capillary: 132 mg/dL — ABNORMAL HIGH (ref 70–99)
Glucose-Capillary: 142 mg/dL — ABNORMAL HIGH (ref 70–99)
Glucose-Capillary: 172 mg/dL — ABNORMAL HIGH (ref 70–99)
Glucose-Capillary: 57 mg/dL — ABNORMAL LOW (ref 70–99)
Glucose-Capillary: 66 mg/dL — ABNORMAL LOW (ref 70–99)
Glucose-Capillary: 69 mg/dL — ABNORMAL LOW (ref 70–99)
Glucose-Capillary: 88 mg/dL (ref 70–99)
Glucose-Capillary: 89 mg/dL (ref 70–99)
Glucose-Capillary: 89 mg/dL (ref 70–99)
Glucose-Capillary: 92 mg/dL (ref 70–99)
Glucose-Capillary: 92 mg/dL (ref 70–99)
Glucose-Capillary: 94 mg/dL (ref 70–99)
Glucose-Capillary: 96 mg/dL (ref 70–99)
Glucose-Capillary: 86 mg/dL (ref 70–99)

## 2023-07-17 LAB — CBC WITH DIFFERENTIAL/PLATELET
Abs Immature Granulocytes: 0.02 10*3/uL (ref 0.00–0.07)
Basophils Absolute: 0 10*3/uL (ref 0.0–0.1)
Basophils Relative: 1 %
Eosinophils Absolute: 0.1 10*3/uL (ref 0.0–0.5)
Eosinophils Relative: 1 %
HCT: 32.2 % — ABNORMAL LOW (ref 39.0–52.0)
Hemoglobin: 10.5 g/dL — ABNORMAL LOW (ref 13.0–17.0)
Immature Granulocytes: 0 %
Lymphocytes Relative: 10 %
Lymphs Abs: 0.5 10*3/uL — ABNORMAL LOW (ref 0.7–4.0)
MCH: 28.7 pg (ref 26.0–34.0)
MCHC: 32.6 g/dL (ref 30.0–36.0)
MCV: 88 fL (ref 80.0–100.0)
Monocytes Absolute: 0.3 10*3/uL (ref 0.1–1.0)
Monocytes Relative: 7 %
Neutro Abs: 3.7 10*3/uL (ref 1.7–7.7)
Neutrophils Relative %: 81 %
Platelets: 321 10*3/uL (ref 150–400)
RBC: 3.66 MIL/uL — ABNORMAL LOW (ref 4.22–5.81)
RDW: 18.6 % — ABNORMAL HIGH (ref 11.5–15.5)
WBC: 4.6 10*3/uL (ref 4.0–10.5)
nRBC: 0 % (ref 0.0–0.2)

## 2023-07-17 LAB — MAGNESIUM: Magnesium: 2.3 mg/dL (ref 1.7–2.4)

## 2023-07-17 MED ORDER — PANTOPRAZOLE SODIUM 40 MG PO TBEC
40.0000 mg | DELAYED_RELEASE_TABLET | Freq: Two times a day (BID) | ORAL | Status: DC
  Administered 2023-07-17: 40 mg via ORAL
  Filled 2023-07-17: qty 1

## 2023-07-17 MED ORDER — CHLORHEXIDINE GLUCONATE CLOTH 2 % EX PADS: 6.0000 | MEDICATED_PAD | Freq: Every day | CUTANEOUS | Status: DC

## 2023-07-17 MED ORDER — LIDOCAINE HCL 1 % IJ SOLN
INTRAMUSCULAR | Status: AC
  Filled 2023-07-17: qty 20

## 2023-07-17 MED ORDER — LACTULOSE 10 GM/15ML PO SOLN
20.0000 g | Freq: Once | ORAL | Status: AC
  Administered 2023-07-17: 20 g via ORAL
  Filled 2023-07-17: qty 30

## 2023-07-17 MED ORDER — PANTOPRAZOLE SODIUM 40 MG PO TBEC: 40.0000 mg | DELAYED_RELEASE_TABLET | Freq: Two times a day (BID) | ORAL | 0 refills | Status: AC

## 2023-07-17 NOTE — TOC Initial Note (Signed)
Transition of Care [facility]) - Initial/Assessment Note    Patient Details  Name: Frank Berg MRN: [id number] Date of Birth: December 03, 1955  [facility]) CM/SW Contact:    Leone Haven, RN Phone Number: 07/17/2023, 10:09 AM  Clinical Narrative:                 From home with spouse, has PCP and insurance on file, states has no HH services in place at this time, but he does have an aide that helps him, he has cpap machine at home.  States wife will transport him  home at [employer] and she is support system, states [facility] [address]  Pta self ambulatory   Expected Discharge Plan: Home/Self Care Barriers to Discharge: Continued Medical Work up   Patient Goals and CMS Choice Patient states their goals for this hospitalization and ongoing recovery are:: return home with wife   Choice offered to / list presented to : NA      Expected Discharge Plan and Services In-house Referral: NA Discharge Planning Services: CM Consult Post Acute Care Choice: NA Living arrangements for the past 2 months: Single Family Home                 DME Arranged: N/A DME Agency: NA       HH Arranged: NA          Prior Living Arrangements/Services Living arrangements for the past 2 months: Single Family Home Lives with:: Spouse Patient language and need for interpreter reviewed:: Yes Do you feel safe going back to the place where you live?: Yes      Need for Family Participation in Patient Care: Yes (Comment) Care giver support system in place?: Yes (comment) Current home services: DME (cpap machine) Criminal Activity/Legal Involvement Pertinent to Current Situation/Hospitalization: No - Comment as needed  Activities of Daily Living Home Assistive Devices/Equipment: None ADL Screening (condition at time of admission) Patient's cognitive ability adequate to safely complete daily activities?: Yes Is the patient deaf or have difficulty hearing?: No Does the  patient have difficulty seeing, even when wearing glasses/contacts?: No Does the patient have difficulty concentrating, remembering, or making decisions?: No Patient able to express need for assistance with ADLs?: Yes Does the patient have difficulty dressing or bathing?: No Independently performs ADLs?: Yes (appropriate for developmental age) Does the patient have difficulty walking or climbing stairs?: Yes Weakness of Legs: Both Weakness of Arms/Hands: Both  Permission Sought/Granted Permission sought to share information with : Case Manager Permission granted to share information with : Yes, Verbal Permission Granted              Emotional Assessment   Attitude/Demeanor/Rapport: Engaged Affect (typically observed): Appropriate Orientation: : Oriented to Self, Oriented to Place, Oriented to  Time, Oriented to Situation   Psych Involvement: No (comment)  Admission diagnosis:  Melena [K92.1] GI bleed [K92.2] Hypotension, unspecified hypotension type [I95.9] Patient Active Problem List   Diagnosis Date Noted   GI bleed 07/14/2023   Chronic anticoagulation 07/14/2023   HFrEF (heart failure with reduced ejection fraction) (HCC) 07/14/2023   Cardiac amyloidosis (HCC) 07/14/2023   Heredofamilial amyloidosis (HCC)-TTR, 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   Acute respiratory failure with hypoxia (HCC)    Precordial chest pain    SOB (shortness of breath)    Acute CHF (congestive heart failure) (HCC) 12/17/2021   Acute CHF (HCC) 12/17/2021   Abnormal findings on diagnostic imaging of liver and biliary tract 05/25/2021  Benign prostatic hyperplasia with lower urinary tract symptoms 05/25/2021   Diastolic dysfunction 05/25/2021   Ischemic cardiomyopathy 05/25/2021   Long term (current) use of anticoagulants 05/25/2021   Unintentional weight loss 05/25/2021   Decompensated heart failure (HCC) 04/25/2021   Systolic CHF, chronic (HCC)    Hemoptysis 03/11/2021   Near syncope 02/12/2021    CKD (chronic kidney disease), 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   Chronic diastolic CHF (congestive heart failure) (HCC) 02/12/2021   Anticoagulated by anticoagulation treatment 01/05/2021   Bilateral impacted cerumen 01/05/2021   Hx of adenomatous polyp of colon 12/03/2020   Coronary artery disease due to lipid rich plaque 12/27/2018   Atrial fibrillation (HCC) 12/27/2018   Obesity 09/10/2018   Right-sided Bell's palsy 07/30/2018   Raynaud's disease 11/06/2017   Male erectile disorder 09/13/2017   CAD (coronary artery disease) 06/20/2017   Hyperlipidemia 06/20/2017   PAF (paroxysmal atrial fibrillation) (HCC) 06/20/2017   Gout 05/22/2017   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HCC) 05/22/2017   Acute systolic CHF (congestive heart failure) (HCC) 10/10/2016   Elevated troponin 10/09/2016   Benign essential hypertension 09/05/2016   Healthcare maintenance 09/05/2016   Need for immunization against influenza 09/05/2016   Pure hypercholesterolemia 09/05/2016   Typical atrial flutter (HCC) 07/19/2016   S/P cryoablation of arrhythmia 06/02/2016   OSA on CPAP 06/02/2016   Type 2 diabetes mellitus (HCC) 06/02/2016   Bilateral edema of lower extremity 05/02/2016   Presence of prosthetic heart valve 05/02/2016   DOE (dyspnea on exertion) 05/02/2016   PCP:  Adrian Prince, MD Pharmacy:   [facility] [address] [place] [address] Phone: [phone] Fax: [phone]  [facility] [place] [facility] [address] [place] - [address] [place] [address] Phone: [phone] Fax: [phone]  [facility] [place], [place] - [address] [place] [address] Phone: [phone] Fax: [phone]  [id number] [place], [place] - [address] [facility] [address] [place] [address] Phone: [phone] Fax:  [phone]     Social Determinants of Health (SDOH) Social History: SDOH Screenings   Food Insecurity: No Food Insecurity (07/15/2023)  Housing: Low Risk  (07/15/2023)  Transportation Needs: No Transportation Needs (07/15/2023)  Utilities: Not At Risk (07/15/2023)  Financial Resource Strain: Low Risk  (07/06/2022)   [facility]  Social Connections: Unknown (03/07/2022)   Received from [facility], [facility]  Tobacco Use: Medium Risk (07/14/2023)   SDOH Interventions:     Readmission Risk Interventions     No data to display

## 2023-07-17 NOTE — Progress Notes (Signed)
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  Patient was complaining of black tarry stool for the last 4 days.  More frequently lightheaded and dizziness over the past week.  In the emergency room blood pressures were borderline low with systolic blood pressure 90.  Hemoglobin 9.5, recent baseline of 12.  Heme positive.  Transfusion ordered and patient was admitted to the hospital with GI consultation. Patient remains stable.  No bowel movement since admission.   Assessment & Plan:   Symptomatic anemia, suspected upper GI bleeding: Clinically improved. Received 1 unit of PRBC.  Hemoglobin has remained stable since then.  Xarelto on hold. Treated with octreotide, IV Protonix and Rocephin.  Does not have history of varices.  Was seen by GI, currently not recommending repeat endoscopies. Protonix IV-will change to p.o. Octreotide discontinued. Rocephin, will discontinue after paracentesis today. Resume Xarelto after paracentesis.  Left rectus abdominis intramuscular hematoma: Due to trauma from paracentesis.  Local therapies.  Does not have evidence of ongoing bleeding.  Hyponatremia: Dilutional hyponatremia.  Monitor.  Stable.  Chronic atrial fibrillation: In and out of A-fib.  On Xarelto.  On hold due to suspected bleeding.  Rate controlled. Planning to go back on Xarelto on discharge.  End-stage liver disease, liver cirrhosis: Chronic hypotension.  Patient on midodrine. MRI of the liver with 1.3 cm hypoechoic mass.  Outpatient MRI recommended for surveillance.  GI will schedule follow-up. Patient with  reaccumulated ascites today, will ask IR to drain today before putting him back on Xarelto.  Ischemic cardiomyopathy/cardiac amyloidosis/heart failure with reduced ejection fraction: Demadex on hold.  Midodrine resumed.  No ACE or beta-blockers due to hypotension.  On palliative care at home.  Chronic urinary retention /BPH: On Flomax.  Patient had suprapubic discomfort and spasm.  Foley catheter changed 9/22, he felt better.   Type 2 diabetes: On sliding scale insulin.  Obstructive sleep apnea: On CPAP.  Paracentesis, lactulose to have bowel movement today.  If uneventful paracentesis and no evidence of bleeding on further bowel movements, possible discharge later today or tomorrow.   DVT prophylaxis: SCDs Start: 07/14/23 2234   Code Status: DNR comfort care Family Communication: None at the bedside Disposition Plan: Status is: Inpatient Remains inpatient appropriate because: IV fluid, IV infusions, and procedures     Consultants:  GI  Procedures:  Paracentesis, ordered  Antimicrobials:  Rocephin 9/20---   Subjective:  Patient seen and examined.  Denies any nausea vomiting.  He is tolerating regular diet.  Belly is tight, he has itching on the left lateral quadrant with previous needle insertion site.  He has not had a bowel movement since last 3 days after getting admitted to the hospital.  Objective: Vitals:   07/16/23 1555 07/16/23 1924 07/17/23 0025 07/17/23 0418  BP: 98/83 109/74 102/71 94/72  Pulse: 77 83 73 81  Resp: 18 17 17 17   Temp: 97.9 F (36.6 C) 98.4 F (36.9 C) 97.8 F (36.6 C) 97.6 F (36.4 C)  TempSrc: Oral Oral Axillary Axillary  SpO2: 99% 99% 96% 90%  Weight:    74.6 kg  Height:        Intake/Output Summary (Last 24 hours) at  07/17/2023 1012 Last data filed at 07/17/2023 0954 Gross per 24 hour  Intake 1466.77 ml  Output 1425 ml  Net 41.77 ml   Filed Weights   07/15/23 0416 07/16/23 0418 07/17/23 0418  Weight: 69 kg 72.5 kg 74.6 kg     Examination:  General: Chronically sick looking.  Thin and frail.   Cardiovascular: S1-S2 normal.  Regular rate rhythm. Respiratory: Bilateral clear.  Poor inspiratory effort. Gastrointestinal: Soft.  Distended.  Mildly tender all over the abdomen. no rigidity or guarding.  Bowel sound present.  Ruptured blister on the midline.  Indented, nontender swelling left lateral quadrant. Ext: No swelling or edema.  No cyanosis. Neuro: Alert and awake.  Generalized weakness.  No focal deficits. Foley catheter with clear urine.   Data Reviewed: I have personally reviewed following labs and imaging studies  CBC: Recent Labs  Lab 07/14/23 1621 07/15/23 0150 07/15/23 0754 07/15/23 1741 07/15/23 2108 07/16/23 0250 07/17/23 0243  WBC 4.5  --   --   --   --   --  4.6  NEUTROABS  --   --   --   --   --   --  3.7  HGB 9.5*   < > 9.7* 11.4* 10.2* 10.3* 10.5*  HCT 29.3*   < > 28.8* 35.1* 31.0* 30.8* 32.2*  MCV 89.9  --   --   --   --   --  88.0  PLT 339  --   --   --   --   --  321   < > = values in this interval not displayed.   Basic Metabolic Panel: Recent Labs  Lab 07/14/23 1621 07/15/23 0150 07/17/23 0243  NA 128* 128* 128*  K 4.3 4.0 5.3*  CL 97* 96* 101  CO2 24 20* 23  GLUCOSE 109* 127* 99  BUN 48* 43* 33*  CREATININE 2.23* 2.04* 1.89*  CALCIUM 8.2* 8.1* 8.1*  MG  --   --  2.3   GFR: Estimated Creatinine Clearance: 38.9 mL/min (A) (by C-G formula based on SCr of 1.89 mg/dL (H)). Liver Function Tests: Recent Labs  Lab 07/14/23 1621 07/15/23 0150 07/17/23 0243  AST 43* 37 29  ALT 11 10 8   ALKPHOS 121 89 89  BILITOT 1.5* 1.4* 1.2  PROT 7.5 6.4* 6.1*  ALBUMIN 2.7* 2.7* 2.3*   No results for input(s): "LIPASE", "AMYLASE" in the last 168 hours. Recent Labs  Lab 07/15/23 0150  AMMONIA 21   Coagulation Profile: Recent Labs  Lab 07/14/23 1621 07/15/23 0150  INR 2.1* 1.9*   Cardiac Enzymes: No results for input(s): "CKTOTAL", "CKMB", "CKMBINDEX",  "TROPONINI" in the last 168 hours. BNP (last 3 results) No results for input(s): "PROBNP" in the last 8760 hours. HbA1C: Recent Labs    07/15/23 0150  HGBA1C 5.8*   CBG: Recent Labs  Lab 07/16/23 2013 07/17/23 0027 07/17/23 0421 07/17/23 0618 07/17/23 0733  GLUCAP 101* 89 96 110* 94   Lipid Profile: No results for input(s): "CHOL", "HDL", "LDLCALC", "TRIG", "CHOLHDL", "LDLDIRECT" in the last 72 hours. Thyroid Function Tests: No results for input(s): "TSH", "T4TOTAL", "FREET4", "T3FREE", "THYROIDAB" in the last 72 hours. Anemia Panel: No results for input(s): "VITAMINB12", "FOLATE", "FERRITIN", "TIBC", "IRON", "RETICCTPCT" in the last 72 hours. Sepsis Labs: Recent Labs  Lab 07/14/23 1702  LATICACIDVEN 1.5    No results found for this or any previous visit (from the past 240 hour(s)).       Radiology Studies: MR LIVER W WO  CONTRAST  Result Date: 07/15/2023 CLINICAL DATA:  Cirrhosis. Indeterminate left hepatic lobe lesion on recent CT. EXAM: MRI ABDOMEN WITHOUT AND WITH CONTRAST TECHNIQUE: Multiplanar multisequence MR imaging of the abdomen was performed both before and after the administration of intravenous contrast. CONTRAST:  6.57mL GADAVIST GADOBUTROL 1 MMOL/ML IV SOLN COMPARISON:  CT on 07/14/2023 and MRCP on 01/21/2019 FINDINGS: Lower chest: Small right and tiny left pleural effusions. Hepatobiliary: Hepatic cirrhosis noted. A small T2 hypointense lesion is seen in segment 2 of the left lobe on image 18/5, measuring 1.3 cm. This lesion is hypovascular on dynamic imaging, without evidence of arterial phase hyperenhancement or contrast washout. These characteristics are consistent with a LI-RADS 3 lesion. No other focal liver lesions are identified. Gallbladder is unremarkable. No evidence of biliary ductal dilatation. Large amount of ascites is seen. Pancreas:  No mass or inflammatory changes. Spleen:  Within normal limits in size and appearance. Adrenals/Urinary Tract: No  suspicious masses identified. No evidence of hydronephrosis. Stomach/Bowel: Unremarkable. Vascular/Lymphatic: No pathologically enlarged lymph nodes identified. No acute vascular findings. Other:  None. Musculoskeletal:  No suspicious bone lesions identified. IMPRESSION: Hepatic cirrhosis.  Large amount of ascites. 1.3 cm hypovascular lesion in segment 2 of the left hepatic lobe. (LI-RADS Category 3: Intermediate probability of malignancy) Recommend continued follow-up with MRI in 6 months. Small right and tiny left pleural effusions. Electronically Signed   By: Danae Orleans M.D.   On: 07/15/2023 15:49        Scheduled Meds:  allopurinol  300 mg Oral Daily   insulin aspart  0-15 Units Subcutaneous Q4H   midodrine  10 mg Oral BID WC   mirtazapine  15 mg Oral QHS   pantoprazole (PROTONIX) IV  40 mg Intravenous Q12H   Tafamidis  61 mg Oral Daily   tamsulosin  0.4 mg Oral QHS   Continuous Infusions:  cefTRIAXone (ROCEPHIN)  IV Stopped (07/16/23 2204)   dextrose 5 % and 0.9 % NaCl 50 mL/hr at 07/17/23 0648     LOS: 3 days    Time spent: 35 minutes     Dorcas Carrow, MD [facility]

## 2023-07-17 NOTE — Plan of Care (Signed)

## 2023-07-17 NOTE — Discharge Summary (Signed)
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  Patient was complaining of black tarry stool for the last 4 days.  More frequently lightheaded and dizziness over the past week. With these complaints and suspected GI bleeding was sent to ER. In the emergency room blood pressures were borderline low with systolic blood pressure 90.  Hemoglobin 9.5, recent baseline of 12.  Heme positive.  Transfusion ordered and patient was admitted to the hospital with GI consultation. Patient was treated conservatively.  Received 1 unit of PRBC transfusion.  Received IV Protonix, octreotide and Rocephin.  Ultimately stabilized.  Going home today.  # Symptomatic anemia, suspected upper GI bleeding: Clinically improved. Received 1 unit of PRBC.  Hemoglobin has remained stable since then.  Xarelto was on hold. Treated with octreotide, IV Protonix and Rocephin.  Does not have history of varices.  Was seen by GI, currently not recommending repeat endoscopies. Monitored off octreotide.  Changed to oral Protonix. GI recommended to discharge patient on Protonix 40 mg twice daily.  Hemoglobin is stable.  Resume Xarelto tonight.  Close follow-up.   Left rectus abdominis intramuscular hematoma: Due to trauma from paracentesis.  Local therapies.   Does not have evidence of ongoing bleeding.   Hyponatremia: Dilutional hyponatremia.  Monitor.  Stable.   Chronic atrial fibrillation: In and out of A-fib.  On Xarelto. Rate controlled. Planning to go back on Xarelto on discharge.   End-stage liver disease, liver cirrhosis: Chronic hypotension.  Patient on midodrine. MRI of the liver with 1.3 cm hypoechoic mass.  Outpatient MRI recommended for surveillance.  GI will schedule follow-up. Patient with reaccumulated ascites, paracentesis done today.  900 mL transited removed.  No evidence of SBP.    Ischemic cardiomyopathy/cardiac amyloidosis/heart failure with reduced ejection fraction: Resume low-dose diuretics.  Midodrine resumed.  No ACE or beta-blockers due to hypotension.  On palliative care at home.   Chronic urinary retention /BPH: On Flomax.  Patient had suprapubic discomfort and spasm.  Foley catheter changed 9/22, he felt better.    Obstructive sleep apnea: On CPAP.  Patient underwent paracentesis, 900 mL removed.  He was given 1 dose of lactulose 20 g and had normal bowel movement.  Willing to go home.  Does have outpatient follow-up.    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   Chronic anticoagulation   HFrEF (heart failure with reduced ejection fraction) (HCC)   Cardiac amyloidosis [facility])    Discharge Instructions  Discharge Instructions     Diet - low sodium heart healthy   Complete by: As  directed    Increase activity slowly   Complete by: As directed       Allergies as of 07/17/2023       Reactions   Lipitor [atorvastatin] Other  (See Comments)   Body aches   Penicillins Swelling   Swollen face   Penicillin G    Other reaction(s): Unknown        Medication List     STOP taking these medications    metFORMIN 1000 MG tablet Commonly known as: GLUCOPHAGE       TAKE these medications    allopurinol 300 MG tablet Commonly known as: ZYLOPRIM Take 300 mg by mouth daily.   Amvuttra 25 MG/0.5ML syringe Generic drug: vutrisiran sodium Inject 0.5 mLs (25 mg total) into the skin every 3 (three) months.   CertaVite/Antioxidants Tabs Take 1 tablet by mouth daily.   colchicine 0.6 MG tablet Take 0.6 mg by mouth as needed.   Ensure Max Protein Liqd Take 330 mLs (11 oz total) by mouth at bedtime.   ferrous sulfate 325 (65 FE) MG tablet Take 1 tablet (325 mg total) by mouth daily with breakfast.   Jardiance 10 MG Tabs tablet Generic drug: empagliflozin take 1 tablet by mouth daily before breakfast   loratadine 10 MG tablet Commonly known as: CLARITIN Take 10 mg by mouth daily as needed for allergies.   midodrine 10 MG tablet Commonly known as: PROAMATINE Take 1 tablet (10 mg total) by mouth 2 (two) times daily with a meal.   mirtazapine 15 MG tablet Commonly known as: REMERON Take 15 mg by mouth at bedtime.   OneTouch Verio test strip Generic drug: glucose blood TEST ONCE A DAY DX E11.29   pantoprazole 40 MG tablet Commonly known as: PROTONIX Take 1 tablet (40 mg total) by mouth 2 (two) times daily.   Rivaroxaban 15 MG Tabs tablet Commonly known as: XARELTO Take 15 mg by mouth daily with supper.   rosuvastatin 5 MG tablet Commonly known as: CRESTOR Take 5 mg by mouth every evening.   tadalafil (PAH) 20 MG tablet Commonly known as: ADCIRCA Take 20 mg by mouth daily as needed (ED).   tamsulosin 0.4 MG Caps capsule Commonly known as: FLOMAX Take 0.4 mg by mouth at bedtime.   torsemide 20 MG tablet Commonly known as: DEMADEX Take 2 tablets (40 mg total) by mouth 2 (two) times  daily for 2 days, THEN 2 tablets (40 mg total) daily. Start taking on: June 02, 2023 What changed: See the new instructions.   Vitamin A 2400 MCG (8000 UT) Caps Take 1 capsule by mouth daily.   Vyndamax 61 MG Caps Generic drug: Tafamidis Take 1 capsule by mouth daily.   zolpidem 5 MG tablet Commonly known as: AMBIEN Take 5 mg by mouth at bedtime as needed.        Allergies  Allergen Reactions   Lipitor [Atorvastatin] Other (See Comments)    Body aches   Penicillins Swelling    Swollen face   Penicillin G     Other reaction(s): Unknown    Consultations: Gastroenterology   Procedures/Studies: MR LIVER W WO CONTRAST  Result Date: 07/15/2023 CLINICAL DATA:  Cirrhosis. Indeterminate left hepatic lobe lesion on recent CT. EXAM: MRI ABDOMEN WITHOUT AND WITH CONTRAST TECHNIQUE: Multiplanar multisequence MR imaging of the abdomen was performed both before and after the administration of intravenous contrast. CONTRAST:  6.34mL GADAVIST GADOBUTROL 1 MMOL/ML IV SOLN COMPARISON:  CT on 07/14/2023 and MRCP on 01/21/2019 FINDINGS: Lower  chest: Small right and tiny left pleural effusions. Hepatobiliary: Hepatic cirrhosis noted. A small T2 hypointense lesion is seen in segment 2 of the left lobe on image 18/5, measuring 1.3 cm. This lesion is hypovascular on dynamic imaging, without evidence of arterial phase hyperenhancement or contrast washout. These characteristics are consistent with a LI-RADS 3 lesion. No other focal liver lesions are identified. Gallbladder is unremarkable. No evidence of biliary ductal dilatation. Large amount of ascites is seen. Pancreas:  No mass or inflammatory changes. Spleen:  Within normal limits in size and appearance. Adrenals/Urinary Tract: No suspicious masses identified. No evidence of hydronephrosis. Stomach/Bowel: Unremarkable. Vascular/Lymphatic: No pathologically enlarged lymph nodes identified. No acute vascular findings. Other:  None. Musculoskeletal:  No  suspicious bone lesions identified. IMPRESSION: Hepatic cirrhosis.  Large amount of ascites. 1.3 cm hypovascular lesion in segment 2 of the left hepatic lobe. (LI-RADS Category 3: Intermediate probability of malignancy) Recommend continued follow-up with MRI in 6 months. Small right and tiny left pleural effusions. Electronically Signed   By: Danae Orleans M.D.   On: 07/15/2023 15:49   CT Angio Abd/Pel W and/or Wo Contrast  Result Date: 07/14/2023 CLINICAL DATA:  Melena EXAM: CTA ABDOMEN AND PELVIS WITHOUT AND WITH CONTRAST TECHNIQUE: Multidetector CT imaging of the abdomen and pelvis was performed using the standard protocol during bolus administration of intravenous contrast. Multiplanar reconstructed images and MIPs were obtained and reviewed to evaluate the vascular anatomy. RADIATION DOSE REDUCTION: This exam was performed according to the departmental dose-optimization program which includes automated exposure control, adjustment of the mA and/or kV according to patient size and/or use of iterative reconstruction technique. CONTRAST:  80mL OMNIPAQUE IOHEXOL 350 MG/ML SOLN COMPARISON:  MR abdomen dated 01/21/2023 FINDINGS: VASCULAR Aorta: Normal caliber aorta without aneurysm, dissection, vasculitis or significant stenosis. Celiac and SMA: Common origin of the celiac and SMA. Patent without evidence of aneurysm, dissection, vasculitis or significant stenosis. Renals: 1 right and 2 left renal arteries. Bilateral renal arteries are patent without evidence of aneurysm, dissection, vasculitis, fibromuscular dysplasia or significant stenosis. IMA: Severe narrowing of the IMA origin to calcified plaque due. No evidence of aneurysm, dissection, or vasculitis. Inflow: Segmental mild-to-moderate narrowing due to calcified atherosclerotic plaque. No evidence of aneurysm, dissection, or vasculitis. Proximal Outflow: Bilateral common femoral and visualized portions of the superficial and profunda femoral arteries are  patent without evidence of aneurysm, dissection, vasculitis or significant stenosis. Veins: No obvious venous abnormality within the limitations of this arterial phase study. Review of the MIP images confirms the above findings. NON-VASCULAR Lower chest: Peripheral right lower lobe scarring. Trace right pleural effusion. Multichamber cardiomegaly. Coronary artery calcifications. Prior aortic valve replacement. Reflux of contrast material into the hepatic veins, suggesting a degree of right heart dysfunction. Hepatobiliary: Cirrhotic morphology. Ill-defined hypoattenuation measuring 1.1 cm in segment 2/3 (9:31). No intra or extrahepatic biliary ductal dilation. Normal gallbladder. Pancreas: No focal lesions or main ductal dilation. Spleen: Normal in size without focal abnormality. Adrenals/Urinary Tract: No adrenal nodules. No suspicious renal mass, calculi or hydronephrosis. Urinary bladder is underdistended with catheter in-situ. Stomach/Bowel: Normal appearance of the stomach. No evidence of bowel wall thickening, distention, or inflammatory changes. Appendix is not discretely seen. Lymphatic: No enlarged abdominal or pelvic lymph nodes. Reproductive: Enlargement of the prostate with median lobe hypertrophy. Other: Small volume ascites.  No free air or fluid collection. Musculoskeletal: No acute or abnormal lytic or blastic osseous lesions. Multilevel degenerative changes of the partially imaged thoracic and lumbar spine. Partially imaged median sternotomy wires  are nondisplaced. Mild body wall edema. Asymmetrically enlarged left rectus abdominis muscle with a focus of active contrast extravasation (14:93). IMPRESSION: 1. No findings of active GI bleed. 2. Asymmetrically enlarged left rectus abdominis muscle with a focus of active contrast extravasation, consistent with intramuscular hematoma. 3. Cirrhotic morphology of the liver with ill-defined hypoattenuation measuring 1.1 cm in segment 2/3, which is  indeterminate. Recommend nonemergent liver protocol MRI for further evaluation. 4. Small volume ascites. 5. Trace right pleural effusion. 6. Multichamber cardiomegaly with reflux of contrast material into the hepatic veins, suggesting a degree of right heart dysfunction. 7. Aortic Atherosclerosis (ICD10-I70.0). Coronary artery calcifications. Assessment for potential risk factor modification, dietary therapy or pharmacologic therapy may be warranted, if clinically indicated. Electronically Signed   By: Agustin Cree M.D.   On: 07/14/2023 20:34   IR Paracentesis  Result Date: 07/14/2023 INDICATION: 67 year old male. History of CHF with recurrent ascites. Request is for therapeutic paracentesis EXAM: ULTRASOUND GUIDED THERAPEUTIC LEFT-SIDED PARACENTESIS MEDICATIONS: lidocaine 1% 10 mL COMPLICATIONS: None immediate. PROCEDURE: Informed written consent was obtained from the patient after a discussion of the risks, benefits and alternatives to treatment. A timeout was performed prior to the initiation of the procedure. Initial ultrasound scanning demonstrates a large amount of ascites within the left lower abdominal quadrant. The left lower abdomen was prepped and draped in the usual sterile fashion. 1% lidocaine was used for local anesthesia. Following this, a 19 gauge, 7-cm, Yueh catheter was introduced. An ultrasound image was saved for documentation purposes. The paracentesis was performed. The catheter was removed and a dressing was applied. The patient tolerated the procedure well without immediate post procedural complication. Patient received post-procedure intravenous albumin; see nursing notes for details. FINDINGS: A total of approximately 5.7 L of serosanguineous fluid was removed. IMPRESSION: Successful ultrasound-guided therapeutic LEFT paracentesis yielding 5.7 L of peritoneal fluid. Read by: Anders Grant, NP Electronically Signed   By: Roanna Banning M.D.   On: 07/14/2023 15:12   IR  Paracentesis  Result Date: 06/27/2023 INDICATION: 68 year old male with history of CHF. Patient with recurrent ascites. Request received for diagnostic and therapeutic paracentesis EXAM: ULTRASOUND GUIDED  PARACENTESIS MEDICATIONS: 7 cc 1% lidocaine COMPLICATIONS: None immediate. PROCEDURE: Informed written consent was obtained from the patient after a discussion of the risks, benefits and alternatives to treatment. A timeout was performed prior to the initiation of the procedure. Initial ultrasound scanning demonstrates a large amount of ascites within the right lower abdominal quadrant. The right lower abdomen was prepped and draped in the usual sterile fashion. 1% lidocaine was used for local anesthesia. Following this, a 19 gauge, 10-cm, Yueh catheter was introduced. An ultrasound image was saved for documentation purposes. The paracentesis was performed. The catheter was removed and a dressing was applied. The patient tolerated the procedure well without immediate post procedural complication. Patient received post-procedure intravenous albumin; see nursing notes for details. FINDINGS: A total of approximately 5.2 L of clear yellow fluid was removed. Samples were sent to the laboratory as requested by the clinical team. IMPRESSION: Successful ultrasound-guided paracentesis yielding 5.2 liters of peritoneal fluid. Procedure performed by Mina Marble, PA-C Electronically Signed   By: Judie Petit.  Shick M.D.   On: 06/27/2023 15:30   (Echo, Carotid, EGD, Colonoscopy, ERCP)    Subjective: Patient seen and examined in the morning rounds.  No overnight events.  Underwent paracentesis today.  Eager to go home.  Feels back to himself.  Going to the bathroom and not getting dizzy.   Discharge Exam: Vitals:  07/17/23 0418 07/17/23 1120  BP: 94/72 91/74  Pulse: 81 76  Resp: 17 18  Temp: 97.6 F (36.4 C) 98 F (36.7 C)  SpO2: 90% 99%   Vitals:   07/16/23 1924 07/17/23 0025 07/17/23 0418 07/17/23 1120  BP:  109/74 102/71 94/72 91/74   Pulse: 83 73 81 76  Resp: 17 17 17 18   Temp: 98.4 F (36.9 C) 97.8 F (36.6 C) 97.6 F (36.4 C) 98 F (36.7 C)  TempSrc: Oral Axillary Axillary Oral  SpO2: 99% 96% 90% 99%  Weight:   74.6 kg   Height:        General: Pt is alert, awake, not in acute distress Chronically sick looking.  Very thin and frail.  Fragile skin. Cardiovascular: Irregularly irregular, S1/S2 +, no rubs, no gallops Respiratory: CTA bilaterally, no wheezing, no rhonchi, on room air. Abdominal: Soft, mildly distended, mild tenderness all over without rigidity or guarding. Foley catheter with clear urine. Extremities: no edema, no cyanosis    The results of significant diagnostics from this hospitalization (including imaging, microbiology, ancillary and laboratory) are listed below for reference.     Microbiology: No results found for this or any previous visit (from the past 240 hour(s)).   Labs: BNP (last 3 results) Recent Labs    10/20/22 1304 01/19/23 1213 05/04/23 1235  BNP 623.3* 796.9* 855.8*   Basic Metabolic Panel: Recent Labs  Lab 07/14/23 1621 07/15/23 0150 07/17/23 0243  NA 128* 128* 128*  K 4.3 4.0 5.3*  CL 97* 96* 101  CO2 24 20* 23  GLUCOSE 109* 127* 99  BUN 48* 43* 33*  CREATININE 2.23* 2.04* 1.89*  CALCIUM 8.2* 8.1* 8.1*  MG  --   --  2.3   Liver Function Tests: Recent Labs  Lab 07/14/23 1621 07/15/23 0150 07/17/23 0243  AST 43* 37 29  ALT 11 10 8   ALKPHOS 121 89 89  BILITOT 1.5* 1.4* 1.2  PROT 7.5 6.4* 6.1*  ALBUMIN 2.7* 2.7* 2.3*   No results for input(s): "LIPASE", "AMYLASE" in the last 168 hours. Recent Labs  Lab 07/15/23 0150  AMMONIA 21   CBC: Recent Labs  Lab 07/14/23 1621 07/15/23 0150 07/15/23 0754 07/15/23 1741 07/15/23 2108 07/16/23 0250 07/17/23 0243  WBC 4.5  --   --   --   --   --  4.6  NEUTROABS  --   --   --   --   --   --  3.7  HGB 9.5*   < > 9.7* 11.4* 10.2* 10.3* 10.5*  HCT 29.3*   < > 28.8* 35.1*  31.0* 30.8* 32.2*  MCV 89.9  --   --   --   --   --  88.0  PLT 339  --   --   --   --   --  321   < > = values in this interval not displayed.   Cardiac Enzymes: No results for input(s): "CKTOTAL", "CKMB", "CKMBINDEX", "TROPONINI" in the last 168 hours. BNP: Invalid input(s): "POCBNP" CBG: Recent Labs  Lab 07/17/23 0027 07/17/23 0421 07/17/23 0618 07/17/23 0733 07/17/23 1155  GLUCAP 89 96 110* 94 86   D-Dimer No results for input(s): "DDIMER" in the last 72 hours. Hgb A1c Recent Labs    07/15/23 0150  HGBA1C 5.8*   Lipid Profile No results for input(s): "CHOL", "HDL", "LDLCALC", "TRIG", "CHOLHDL", "LDLDIRECT" in the last 72 hours. Thyroid function studies No results for input(s): "TSH", "T4TOTAL", "T3FREE", "THYROIDAB" in the last  72 hours.  Invalid input(s): "FREET3" Anemia work up No results for input(s): "VITAMINB12", "FOLATE", "FERRITIN", "TIBC", "IRON", "RETICCTPCT" in the last 72 hours. Urinalysis    Component Value Date/Time   COLORURINE YELLOW 11/04/2016 0508   APPEARANCEUR CLEAR 11/04/2016 0508   LABSPEC 1.019 11/04/2016 0508   PHURINE 6.0 11/04/2016 0508   GLUCOSEU NEGATIVE 11/04/2016 0508   HGBUR NEGATIVE 11/04/2016 0508   BILIRUBINUR NEGATIVE 11/04/2016 0508   KETONESUR NEGATIVE 11/04/2016 0508   PROTEINUR NEGATIVE 11/04/2016 0508   NITRITE NEGATIVE 11/04/2016 0508   LEUKOCYTESUR NEGATIVE 11/04/2016 0508   Sepsis Labs Recent Labs  Lab 07/14/23 1621 07/17/23 0243  WBC 4.5 4.6   Microbiology No results found for this or any previous visit (from the past 240 hour(s)).   Time coordinating discharge: 35 minutes  SIGNED:   Dorcas Carrow, MD  [facility] 07/17/2023, 2:38 PM

## 2023-07-17 NOTE — Progress Notes (Signed)
Pt on home unit; able to place self on/off   07/16/23 2030  BiPAP/CPAP/SIPAP  BiPAP/CPAP/SIPAP Pt Type Adult  EPAP 15 cmH2O  FiO2 (%) 21 %

## 2023-07-17 NOTE — Procedures (Signed)
Ultrasound-guided therapeutic paracentesis performed yielding 900 mililiters of serosanguinous colored fluid.  No immediate complications. EBL is none.

## 2023-07-17 NOTE — Care Management Important Message (Signed)
Important Message  Patient Details  Name: Frank Berg MRN: [id number] Date of Birth: [birth year]   Important Message Given:  Yes - Medicare IM     Dorena Bodo 07/17/2023, 3:42 PM

## 2023-07-17 NOTE — Plan of Care (Signed)
  Problem: Education: Goal: Ability to describe self-care measures that may prevent or decrease complications (Diabetes Survival Skills Education) will improve 07/17/2023 1629 by Zada Finders, RN Outcome: Adequate for Discharge 07/17/2023 1442 by Zada Finders, RN Outcome: Progressing Goal: Individualized Educational Video(s) 07/17/2023 1629 by Zada Finders, RN Outcome: Adequate for Discharge 07/17/2023 1442 by Zada Finders, RN Outcome: Progressing   Problem: Coping: Goal: Ability to adjust to condition or change in health will improve 07/17/2023 1629 by Zada Finders, RN Outcome: Adequate for Discharge 07/17/2023 1442 by Zada Finders, RN Outcome: Progressing   Problem: Fluid Volume: Goal: Ability to maintain a balanced intake and output will improve 07/17/2023 1629 by Lorrin Jackson D, RN Outcome: Adequate for Discharge 07/17/2023 1442 by Zada Finders, RN Outcome: Progressing   Problem: Health Behavior/Discharge Planning: Goal: Ability to identify and utilize available resources and services will improve Outcome: Adequate for Discharge Goal: Ability to manage health-related needs will improve Outcome: Adequate for Discharge   Problem: Metabolic: Goal: Ability to maintain appropriate glucose levels will improve Outcome: Adequate for Discharge   Problem: Nutritional: Goal: Maintenance of adequate nutrition will improve Outcome: Adequate for Discharge Goal: Progress toward achieving an optimal weight will improve Outcome: Adequate for Discharge   Problem: Skin Integrity: Goal: Risk for impaired skin integrity will decrease Outcome: Adequate for Discharge   Problem: Tissue Perfusion: Goal: Adequacy of tissue perfusion will improve Outcome: Adequate for Discharge   Problem: Education: Goal: Knowledge of General Education information will improve Description: Including pain rating scale, medication(s)/side effects and  non-pharmacologic comfort measures Outcome: Adequate for Discharge   Problem: Health Behavior/Discharge Planning: Goal: Ability to manage health-related needs will improve Outcome: Adequate for Discharge   Problem: Clinical Measurements: Goal: Ability to maintain clinical measurements within normal limits will improve Outcome: Adequate for Discharge Goal: Will remain free from infection Outcome: Adequate for Discharge Goal: Diagnostic test results will improve Outcome: Adequate for Discharge Goal: Respiratory complications will improve Outcome: Adequate for Discharge Goal: Cardiovascular complication will be avoided Outcome: Adequate for Discharge   Problem: Activity: Goal: Risk for activity intolerance will decrease Outcome: Adequate for Discharge   Problem: Nutrition: Goal: Adequate nutrition will be maintained Outcome: Adequate for Discharge   Problem: Coping: Goal: Level of anxiety will decrease Outcome: Adequate for Discharge   Problem: Elimination: Goal: Will not experience complications related to bowel motility Outcome: Adequate for Discharge Goal: Will not experience complications related to urinary retention Outcome: Adequate for Discharge   Problem: Pain Managment: Goal: General experience of comfort will improve Outcome: Adequate for Discharge   Problem: Safety: Goal: Ability to remain free from injury will improve Outcome: Adequate for Discharge   Problem: Skin Integrity: Goal: Risk for impaired skin integrity will decrease Outcome: Adequate for Discharge

## 2023-07-17 NOTE — TOC Transition Note (Signed)
Transition of Care [facility]) - CM/SW Discharge Note   Patient Details  Name: Frank Berg MRN: [id number] Date of Birth: March 25, 1956  Transition of [facility]) CM/SW Contact:  Leone Haven, RN Phone Number: 07/17/2023, 2:46 PM   Clinical Narrative:    For dc today, has no needs. Wife to transport home.   Final next level of care: Home/Self Care Barriers to Discharge: Continued Medical Work up   Patient Goals and CMS Choice   Choice offered to / list presented to : NA  Discharge Placement                         Discharge Plan and Services Additional resources added to the After Visit Summary for   In-house Referral: NA Discharge Planning Services: CM Consult Post Acute Care Choice: NA          DME Arranged: N/A DME Agency: NA       HH Arranged: NA          Social Determinants of Health (SDOH) Interventions SDOH Screenings   Food Insecurity: No Food Insecurity (07/15/2023)  Housing: Low Risk  (07/15/2023)  Transportation Needs: No Transportation Needs (07/15/2023)  Utilities: Not At Risk (07/15/2023)  Financial Resource Strain: Low Risk  (07/06/2022)   [facility]  Social Connections: Unknown (03/07/2022)   Received from [facility], [facility]  Tobacco Use: Medium Risk (07/14/2023)     Readmission Risk Interventions     No data to display

## 2023-07-18 ENCOUNTER — Emergency Department (HOSPITAL_COMMUNITY): Payer: Medicare HMO

## 2023-07-18 ENCOUNTER — Other Ambulatory Visit: Payer: Self-pay

## 2023-07-18 ENCOUNTER — Inpatient Hospital Stay (HOSPITAL_COMMUNITY)
Admission: EM | Admit: 2023-07-18 | Discharge: 2023-07-24 | DRG: 555 | Disposition: A | Discharge status: Hospice | Payer: Medicare HMO | Attending: Internal Medicine | Admitting: Internal Medicine

## 2023-07-18 ENCOUNTER — Encounter (HOSPITAL_COMMUNITY): Payer: Self-pay | Admitting: Emergency Medicine

## 2023-07-18 ENCOUNTER — Inpatient Hospital Stay (HOSPITAL_COMMUNITY): Admission: RE | Admit: 2023-07-18 | Payer: Medicare HMO | Source: Ambulatory Visit

## 2023-07-18 ENCOUNTER — Encounter (HOSPITAL_COMMUNITY): Payer: Self-pay

## 2023-07-18 DIAGNOSIS — I425 Other restrictive cardiomyopathy: Secondary | ICD-10-CM | POA: Diagnosis present

## 2023-07-18 DIAGNOSIS — N1832 Chronic kidney disease, stage 3b: Secondary | ICD-10-CM | POA: Diagnosis present

## 2023-07-18 DIAGNOSIS — I071 Rheumatic tricuspid insufficiency: Secondary | ICD-10-CM | POA: Diagnosis present

## 2023-07-18 DIAGNOSIS — I43 Cardiomyopathy in diseases classified elsewhere: Secondary | ICD-10-CM | POA: Diagnosis present

## 2023-07-18 DIAGNOSIS — N182 Chronic kidney disease, stage 2 (mild): Secondary | ICD-10-CM | POA: Diagnosis present

## 2023-07-18 DIAGNOSIS — I255 Ischemic cardiomyopathy: Secondary | ICD-10-CM | POA: Diagnosis present

## 2023-07-18 DIAGNOSIS — E785 Hyperlipidemia, unspecified: Secondary | ICD-10-CM | POA: Diagnosis present

## 2023-07-18 DIAGNOSIS — I130 Hypertensive heart and chronic kidney disease with heart failure and stage 1 through stage 4 chronic kidney disease, or unspecified chronic kidney disease: Secondary | ICD-10-CM | POA: Diagnosis present

## 2023-07-18 DIAGNOSIS — I480 Paroxysmal atrial fibrillation: Secondary | ICD-10-CM | POA: Diagnosis present

## 2023-07-18 DIAGNOSIS — K7460 Unspecified cirrhosis of liver: Secondary | ICD-10-CM | POA: Diagnosis present

## 2023-07-18 DIAGNOSIS — Z7189 Other specified counseling: Secondary | ICD-10-CM | POA: Diagnosis not present

## 2023-07-18 DIAGNOSIS — Z66 Do not resuscitate: Secondary | ICD-10-CM | POA: Diagnosis present

## 2023-07-18 DIAGNOSIS — D62 Acute posthemorrhagic anemia: Secondary | ICD-10-CM | POA: Diagnosis present

## 2023-07-18 DIAGNOSIS — I5042 Chronic combined systolic (congestive) and diastolic (congestive) heart failure: Secondary | ICD-10-CM | POA: Diagnosis present

## 2023-07-18 DIAGNOSIS — I10 Essential (primary) hypertension: Secondary | ICD-10-CM | POA: Diagnosis present

## 2023-07-18 DIAGNOSIS — Z8249 Family history of ischemic heart disease and other diseases of the circulatory system: Secondary | ICD-10-CM

## 2023-07-18 DIAGNOSIS — N401 Enlarged prostate with lower urinary tract symptoms: Secondary | ICD-10-CM | POA: Diagnosis present

## 2023-07-18 DIAGNOSIS — R791 Abnormal coagulation profile: Secondary | ICD-10-CM | POA: Diagnosis present

## 2023-07-18 DIAGNOSIS — Z79899 Other long term (current) drug therapy: Secondary | ICD-10-CM

## 2023-07-18 DIAGNOSIS — I959 Hypotension, unspecified: Secondary | ICD-10-CM | POA: Diagnosis not present

## 2023-07-18 DIAGNOSIS — G4733 Obstructive sleep apnea (adult) (pediatric): Secondary | ICD-10-CM | POA: Diagnosis present

## 2023-07-18 DIAGNOSIS — K767 Hepatorenal syndrome: Secondary | ICD-10-CM | POA: Diagnosis present

## 2023-07-18 DIAGNOSIS — Z87891 Personal history of nicotine dependence: Secondary | ICD-10-CM

## 2023-07-18 DIAGNOSIS — I2510 Atherosclerotic heart disease of native coronary artery without angina pectoris: Secondary | ICD-10-CM | POA: Diagnosis present

## 2023-07-18 DIAGNOSIS — Z953 Presence of xenogenic heart valve: Secondary | ICD-10-CM

## 2023-07-18 DIAGNOSIS — E1122 Type 2 diabetes mellitus with diabetic chronic kidney disease: Secondary | ICD-10-CM | POA: Diagnosis present

## 2023-07-18 DIAGNOSIS — Z833 Family history of diabetes mellitus: Secondary | ICD-10-CM

## 2023-07-18 DIAGNOSIS — E1142 Type 2 diabetes mellitus with diabetic polyneuropathy: Secondary | ICD-10-CM | POA: Diagnosis present

## 2023-07-18 DIAGNOSIS — D649 Anemia, unspecified: Principal | ICD-10-CM

## 2023-07-18 DIAGNOSIS — M7981 Nontraumatic hematoma of soft tissue: Principal | ICD-10-CM | POA: Diagnosis present

## 2023-07-18 DIAGNOSIS — R64 Cachexia: Secondary | ICD-10-CM | POA: Diagnosis present

## 2023-07-18 DIAGNOSIS — R188 Other ascites: Secondary | ICD-10-CM | POA: Diagnosis present

## 2023-07-18 DIAGNOSIS — M109 Gout, unspecified: Secondary | ICD-10-CM | POA: Diagnosis present

## 2023-07-18 DIAGNOSIS — K7581 Nonalcoholic steatohepatitis (NASH): Secondary | ICD-10-CM | POA: Diagnosis present

## 2023-07-18 DIAGNOSIS — K661 Hemoperitoneum: Secondary | ICD-10-CM | POA: Diagnosis present

## 2023-07-18 DIAGNOSIS — Z515 Encounter for palliative care: Secondary | ICD-10-CM

## 2023-07-18 DIAGNOSIS — R140 Abdominal distension (gaseous): Secondary | ICD-10-CM | POA: Diagnosis present

## 2023-07-18 DIAGNOSIS — E861 Hypovolemia: Secondary | ICD-10-CM | POA: Diagnosis not present

## 2023-07-18 DIAGNOSIS — T45515A Adverse effect of anticoagulants, initial encounter: Secondary | ICD-10-CM | POA: Diagnosis present

## 2023-07-18 DIAGNOSIS — E871 Hypo-osmolality and hyponatremia: Secondary | ICD-10-CM | POA: Diagnosis present

## 2023-07-18 DIAGNOSIS — N170 Acute kidney failure with tubular necrosis: Secondary | ICD-10-CM | POA: Diagnosis present

## 2023-07-18 DIAGNOSIS — Z7984 Long term (current) use of oral hypoglycemic drugs: Secondary | ICD-10-CM

## 2023-07-18 DIAGNOSIS — K219 Gastro-esophageal reflux disease without esophagitis: Secondary | ICD-10-CM | POA: Diagnosis present

## 2023-07-18 DIAGNOSIS — N179 Acute kidney failure, unspecified: Secondary | ICD-10-CM

## 2023-07-18 DIAGNOSIS — Z7901 Long term (current) use of anticoagulants: Secondary | ICD-10-CM

## 2023-07-18 DIAGNOSIS — I9589 Other hypotension: Secondary | ICD-10-CM | POA: Diagnosis present

## 2023-07-18 DIAGNOSIS — Z880 Allergy status to penicillin: Secondary | ICD-10-CM

## 2023-07-18 DIAGNOSIS — Z888 Allergy status to other drugs, medicaments and biological substances status: Secondary | ICD-10-CM

## 2023-07-18 DIAGNOSIS — R54 Age-related physical debility: Secondary | ICD-10-CM | POA: Diagnosis present

## 2023-07-18 DIAGNOSIS — K7210 Chronic hepatic failure without coma: Secondary | ICD-10-CM | POA: Diagnosis present

## 2023-07-18 DIAGNOSIS — Z539 Procedure and treatment not carried out, unspecified reason: Secondary | ICD-10-CM | POA: Diagnosis present

## 2023-07-18 DIAGNOSIS — Z951 Presence of aortocoronary bypass graft: Secondary | ICD-10-CM

## 2023-07-18 DIAGNOSIS — E119 Type 2 diabetes mellitus without complications: Secondary | ICD-10-CM

## 2023-07-18 DIAGNOSIS — R338 Other retention of urine: Secondary | ICD-10-CM | POA: Diagnosis present

## 2023-07-18 DIAGNOSIS — Z6821 Body mass index (BMI) 21.0-21.9, adult: Secondary | ICD-10-CM

## 2023-07-18 LAB — PREPARE RBC (CROSSMATCH)

## 2023-07-18 LAB — TYPE AND SCREEN
ABO/RH(D): B POS
Antibody Screen: NEGATIVE
Unit division: 0
Unit division: 0

## 2023-07-18 LAB — LIPASE, BLOOD: Lipase: 96 U/L — ABNORMAL HIGH (ref 11–51)

## 2023-07-18 LAB — BPAM RBC
Blood Product Expiration Date: 202410242359
Blood Product Expiration Date: 202410242359
ISSUE DATE / TIME: 202409201740
Unit Type and Rh: 7300
Unit Type and Rh: 7300

## 2023-07-18 LAB — BRAIN NATRIURETIC PEPTIDE: B Natriuretic Peptide: 484.6 pg/mL — ABNORMAL HIGH (ref 0.0–100.0)

## 2023-07-18 LAB — PROTIME-INR
INR: 3.9 — ABNORMAL HIGH (ref 0.8–1.2)
Prothrombin Time: 38.5 seconds — ABNORMAL HIGH (ref 11.4–15.2)

## 2023-07-18 LAB — COMPREHENSIVE METABOLIC PANEL
ALT: 7 U/L (ref 0–44)
AST: 31 U/L (ref 15–41)
Albumin: 2 g/dL — ABNORMAL LOW (ref 3.5–5.0)
Alkaline Phosphatase: 71 U/L (ref 38–126)
Anion gap: 9 (ref 5–15)
BUN: 42 mg/dL — ABNORMAL HIGH (ref 8–23)
CO2: 19 mmol/L — ABNORMAL LOW (ref 22–32)
Calcium: 7.7 mg/dL — ABNORMAL LOW (ref 8.9–10.3)
Chloride: 97 mmol/L — ABNORMAL LOW (ref 98–111)
Creatinine, Ser: 2.1 mg/dL — ABNORMAL HIGH (ref 0.61–1.24)
GFR, Estimated: 33 mL/min — ABNORMAL LOW (ref >60)
Glucose, Bld: 99 mg/dL (ref 70–99)
Potassium: 5 mmol/L (ref 3.5–5.1)
Sodium: 125 mmol/L — ABNORMAL LOW (ref 135–145)
Total Bilirubin: 1.4 mg/dL — ABNORMAL HIGH (ref 0.3–1.2)
Total Protein: 5.4 g/dL — ABNORMAL LOW (ref 6.5–8.1)

## 2023-07-18 LAB — CBC WITH DIFFERENTIAL/PLATELET
Abs Immature Granulocytes: 0.06 10*3/uL (ref 0.00–0.07)
Basophils Absolute: 0 10*3/uL (ref 0.0–0.1)
Basophils Relative: 0 %
Eosinophils Absolute: 0 10*3/uL (ref 0.0–0.5)
Eosinophils Relative: 0 %
HCT: 22.2 % — ABNORMAL LOW (ref 39.0–52.0)
Hemoglobin: 7.1 g/dL — ABNORMAL LOW (ref 13.0–17.0)
Immature Granulocytes: 1 %
Lymphocytes Relative: 4 %
Lymphs Abs: 0.3 10*3/uL — ABNORMAL LOW (ref 0.7–4.0)
MCH: 29.1 pg (ref 26.0–34.0)
MCHC: 32 g/dL (ref 30.0–36.0)
MCV: 91 fL (ref 80.0–100.0)
Monocytes Absolute: 0.4 10*3/uL (ref 0.1–1.0)
Monocytes Relative: 5 %
Neutro Abs: 6.9 10*3/uL (ref 1.7–7.7)
Neutrophils Relative %: 90 %
Platelets: 242 10*3/uL (ref 150–400)
RBC: 2.44 MIL/uL — ABNORMAL LOW (ref 4.22–5.81)
RDW: 18.2 % — ABNORMAL HIGH (ref 11.5–15.5)
WBC: 7.6 10*3/uL (ref 4.0–10.5)
nRBC: 0 % (ref 0.0–0.2)

## 2023-07-18 LAB — POC OCCULT BLOOD, ED: Fecal Occult Bld: POSITIVE — AB

## 2023-07-18 LAB — HEMOGLOBIN AND HEMATOCRIT, BLOOD
HCT: 26.5 % — ABNORMAL LOW (ref 39.0–52.0)
Hemoglobin: 8.4 g/dL — ABNORMAL LOW (ref 13.0–17.0)

## 2023-07-18 MED ORDER — MIRTAZAPINE 15 MG PO TABS
15.0000 mg | ORAL_TABLET | Freq: Every day | ORAL | Status: DC
  Administered 2023-07-18 – 2023-07-23 (×6): 15 mg via ORAL
  Filled 2023-07-18 (×6): qty 1

## 2023-07-18 MED ORDER — EMPAGLIFLOZIN 10 MG PO TABS
10.0000 mg | ORAL_TABLET | Freq: Every day | ORAL | Status: DC
  Administered 2023-07-19: 10 mg via ORAL
  Filled 2023-07-18: qty 1

## 2023-07-18 MED ORDER — ENSURE MAX PROTEIN PO LIQD
11.0000 [oz_av] | Freq: Every day | ORAL | Status: DC
  Administered 2023-07-18 – 2023-07-23 (×6): 11 [oz_av] via ORAL
  Filled 2023-07-18 (×8): qty 330

## 2023-07-18 MED ORDER — ROSUVASTATIN CALCIUM 5 MG PO TABS
5.0000 mg | ORAL_TABLET | Freq: Every evening | ORAL | Status: DC
  Administered 2023-07-18 – 2023-07-23 (×6): 5 mg via ORAL
  Filled 2023-07-18 (×6): qty 1

## 2023-07-18 MED ORDER — PANTOPRAZOLE SODIUM 40 MG IV SOLR
40.0000 mg | Freq: Two times a day (BID) | INTRAVENOUS | Status: DC
  Administered 2023-07-18 – 2023-07-24 (×12): 40 mg via INTRAVENOUS
  Filled 2023-07-18 (×12): qty 10

## 2023-07-18 MED ORDER — SODIUM CHLORIDE 0.9% IV SOLUTION: Freq: Once | INTRAVENOUS | Status: AC

## 2023-07-18 MED ORDER — SODIUM CHLORIDE 0.9 % IV BOLUS
500.0000 mL | Freq: Once | INTRAVENOUS | Status: AC
  Administered 2023-07-18: 500 mL via INTRAVENOUS

## 2023-07-18 MED ORDER — FUROSEMIDE 10 MG/ML IJ SOLN
20.0000 mg | Freq: Once | INTRAMUSCULAR | Status: AC
  Administered 2023-07-18: 20 mg via INTRAVENOUS
  Filled 2023-07-18: qty 2

## 2023-07-18 MED ORDER — ALLOPURINOL 300 MG PO TABS
300.0000 mg | ORAL_TABLET | Freq: Every day | ORAL | Status: DC
  Administered 2023-07-18 – 2023-07-24 (×7): 300 mg via ORAL
  Filled 2023-07-18 (×5): qty 1
  Filled 2023-07-18 (×2): qty 3
  Filled 2023-07-18: qty 1

## 2023-07-18 MED ORDER — SODIUM CHLORIDE 0.9 % IV SOLN
1.0000 g | INTRAVENOUS | Status: DC
  Administered 2023-07-18 – 2023-07-24 (×7): 1 g via INTRAVENOUS
  Filled 2023-07-18 (×7): qty 10

## 2023-07-18 MED ORDER — MIDODRINE HCL 5 MG PO TABS
10.0000 mg | ORAL_TABLET | Freq: Two times a day (BID) | ORAL | Status: DC
  Administered 2023-07-18 – 2023-07-19 (×2): 10 mg via ORAL
  Filled 2023-07-18 (×2): qty 2

## 2023-07-18 MED ORDER — TORSEMIDE 20 MG PO TABS
40.0000 mg | ORAL_TABLET | Freq: Every day | ORAL | Status: DC
  Administered 2023-07-18: 40 mg via ORAL
  Filled 2023-07-18: qty 2

## 2023-07-18 MED ORDER — TAFAMIDIS 61 MG PO CAPS
1.0000 | ORAL_CAPSULE | Freq: Every day | ORAL | Status: DC
  Administered 2023-07-20 – 2023-07-24 (×5): 61 mg via ORAL
  Filled 2023-07-18 (×5): qty 1

## 2023-07-18 MED ORDER — SODIUM CHLORIDE 0.9 % IV SOLN: INTRAVENOUS | Status: DC

## 2023-07-18 MED ORDER — IOHEXOL 350 MG/ML SOLN
60.0000 mL | Freq: Once | INTRAVENOUS | Status: AC | PRN
  Administered 2023-07-18: 60 mL via INTRAVENOUS

## 2023-07-18 MED ORDER — ACETAMINOPHEN 325 MG PO TABS
650.0000 mg | ORAL_TABLET | Freq: Once | ORAL | Status: AC
  Administered 2023-07-18: 650 mg via ORAL
  Filled 2023-07-18: qty 2

## 2023-07-18 MED ORDER — ZOLPIDEM TARTRATE 5 MG PO TABS
5.0000 mg | ORAL_TABLET | Freq: Every evening | ORAL | Status: DC | PRN
  Administered 2023-07-21 – 2023-07-23 (×3): 5 mg via ORAL
  Filled 2023-07-18 (×3): qty 1

## 2023-07-18 MED ORDER — COLCHICINE 0.6 MG PO TABS: 0.6000 mg | ORAL_TABLET | ORAL | Status: DC | PRN

## 2023-07-18 MED ORDER — TAMSULOSIN HCL 0.4 MG PO CAPS
0.4000 mg | ORAL_CAPSULE | Freq: Every day | ORAL | Status: DC
  Administered 2023-07-18 – 2023-07-23 (×6): 0.4 mg via ORAL
  Filled 2023-07-18 (×6): qty 1

## 2023-07-18 NOTE — Consult Note (Signed)
Consultation  Primary Care Physician:  Adrian Prince, MD Primary Gastroenterologist:  Dr. Leone Payor       Reason for Consultation: Anemia DOA: 07/18/2023         Hospital Day: 1         HPI:   Frank Berg is a 67 y.o. 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   Work up notable for:  Hgb 7.1 today, yesterday was 10.5. Blood pressure 97/54. Negative rectal exam.   Patient was discharged yesterday from hospital for previous admission for melenic stools, 3 point drop in hgb from baseline and hypotension. Received I unit RBC (9/20) during hospitalization. EGD held due to high risk of cardiovascular complications from sedation. He also had 900 mL of ascites fluid drawn from his left abdomen yesterday.  Review of outpatient encounters show that his blood pressure is usually low, with systolics in the 80s, diastolics 60s.   He was previously seen by Dr. Leone Payor in the GI clinic, and had initially planned on EGD and colonoscopy to evaluate iron deficiency, however these procedures were canceled due to patient's progression of his cardiac disease and palliative care referral.   Patient lying on stretcher. Reports after he was discharged home he was having several near syncopal episodes and dizziness. He reports one dark stool yesterday in hospital before discharge and then 2-3 tan colored stools when he got home and through the night. Denies nausea or vomiting. Denies Hematemesis. No present abdominal pain upon examination. Reports he restarted his Xarelto last night.  No family was present at the time of my evaluation.   Previous GI workup:   07/17/2023  PARACENTESIS IMPRESSION: Successful ultrasound-guided therapeutic LEFT paracentesis yielding 900 mL of peritoneal fluid.  07/15/2023 MR LIVER IMPRESSION: Hepatic cirrhosis.  Large amount of ascites.   1.3 cm hypovascular lesion in segment 2 of the left hepatic lobe. (LI-RADS Category 3: Intermediate probability of malignancy) Recommend continued follow-up with MRI in 6 months.   Small right and tiny left pleural effusions.  07/14/2023 CT ANGIO ABD/PELVIS IMPRESSION: 1. No findings of active GI bleed. 2. Asymmetrically enlarged left rectus abdominis muscle with a focus of active contrast extravasation, consistent with intramuscular hematoma. 3. Cirrhotic morphology of the liver with ill-defined hypoattenuation measuring 1.1 cm in segment 2/3, which is indeterminate. Recommend nonemergent liver protocol MRI for further evaluation. 4. Small volume ascites. 5. Trace right pleural effusion. 6. Multichamber cardiomegaly with reflux of contrast material into the hepatic veins, suggesting a degree of right heart dysfunction. 7. Aortic Atherosclerosis (ICD10-I70.0). Coronary artery calcifications. Assessment for potential risk factor modification, dietary therapy or pharmacologic therapy Regis Hinton be warranted, if clinically indicated.  11/25/2020 Colonoscopy for positive FIT with Dr. Leone Payor Impression:  - One diminutive polyp in the transverse colon, removed with a cold snare. Resected and retrieved.  - External and internal hemorrhoids.  - Redundant colon. Extremely so. I could see cecum but not enter deeply despite pressure, abdominal binder, forceps tractiuon and supine position.  - The examination was otherwise normal on direct and retroflexion views.  Abnormal ED labs: Abnormal Labs Reviewed  CBC WITH DIFFERENTIAL/PLATELET - Abnormal; Notable for the following components:      Result Value   RBC 2.44 (*)  Hemoglobin 7.1 (*)    HCT 22.2 (*)    RDW 18.2 (*)    Lymphs Abs 0.3 (*)     All other components within normal limits  COMPREHENSIVE METABOLIC PANEL - Abnormal; Notable for the following components:   Sodium 125 (*)    Chloride 97 (*)    CO2 19 (*)    BUN 42 (*)    Creatinine, Ser 2.10 (*)    Calcium 7.7 (*)    Total Protein 5.4 (*)    Albumin 2.0 (*)    Total Bilirubin 1.4 (*)    GFR, Estimated 33 (*)    All other components within normal limits  LIPASE, BLOOD - Abnormal; Notable for the following components:   Lipase 96 (*)    All other components within normal limits  PROTIME-INR - Abnormal; Notable for the following components:   Prothrombin Time 38.5 (*)    INR 3.9 (*)    All other components within normal limits    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 Family History:  His family history includes Breast cancer in his sister; Cervical cancer in his sister; Congenital heart disease in his mother; Diabetes in his brother, brother, mother, sister, sister, sister, sister, sister, and son; Heart attack in his father; Heart disease in his mother; Hypertension in his brother and mother; Pancreatic cancer in his brother. Social History:   reports that he  quit smoking about 41 years ago. His smoking use included cigarettes. He started smoking about 51 years ago. He has a 2.5 pack-year smoking history. He has never used smokeless tobacco. He reports current alcohol use. He reports that he does not use drugs.  Prior to Admission medications   Medication Sig Start Date End Date Taking? Authorizing Provider  allopurinol (ZYLOPRIM) 300 MG tablet Take 300 mg by mouth daily. 11/23/19   [provider]  colchicine 0.6 MG tablet Take 0.6 mg by mouth as needed. 02/14/23   [provider]  empagliflozin (JARDIANCE) 10 MG TABS tablet take 1 tablet by mouth daily before breakfast 02/14/23   Bensimhon, Bevelyn Buckles, MD  Ensure Max Protein (ENSURE MAX PROTEIN) LIQD Take 330 mLs (11 oz total) by mouth at bedtime. 12/20/21   Marguerita Merles Latif, DO  ferrous sulfate 325 (65 FE) MG tablet Take 1 tablet (325 mg total) by mouth daily with breakfast. 03/09/23   [name], MD  glucose blood (ONETOUCH VERIO) test strip TEST ONCE A DAY DX E11.29    [provider]  loratadine (CLARITIN) 10 MG tablet Take 10 mg by mouth daily as needed for allergies.    [provider]  midodrine (PROAMATINE) 10 MG tablet Take 1 tablet (10 mg total) by mouth 2 (two) times  daily with a meal. 05/04/23   Bensimhon, Bevelyn Buckles, MD  mirtazapine (REMERON) 15 MG tablet Take 15 mg by mouth at bedtime.    [provider]  Multiple Vitamins-Minerals (CERTAVITE/ANTIOXIDANTS) TABS Take 1 tablet by mouth daily. 12/21/21   Marguerita Merles Latif, DO  pantoprazole (PROTONIX) 40 MG tablet Take 1 tablet (40 mg total) by mouth 2 (two) times daily. 07/17/23 08/16/23  Dorcas Carrow, MD  Rivaroxaban (XARELTO) 15 MG TABS tablet Take 15 mg by mouth daily with supper.    [provider]  rosuvastatin (CRESTOR) 5 MG tablet Take 5 mg by mouth every evening. 08/26/16   [provider]  tadalafil, PAH, (ADCIRCA) 20 MG tablet Take 20 mg by mouth daily as needed (ED).  05/06/21   [provider]  Tafamidis (VYNDAMAX) 61 MG CAPS Take 1 capsule by mouth daily. 09/08/22   Bensimhon, Bevelyn Buckles, MD  tamsulosin (FLOMAX) 0.4 MG CAPS capsule Take 0.4 mg by mouth at bedtime. 06/09/23   [provider]  torsemide (DEMADEX) 20 MG tablet Take 2 tablets (40 mg total) by mouth 2 (two) times daily for 2 days, THEN 2 tablets (40 mg total) daily. Patient taking differently:  2 tablets (40 mg total) daily. 06/02/23 07/14/23  Bensimhon, Bevelyn Buckles, MD  Vitamin A 2400 MCG (8000 UT) CAPS Take 1 capsule by mouth daily. 06/29/23   Bensimhon, Bevelyn Buckles, MD  vutrisiran sodium (AMVUTTRA) 25 MG/0.5ML syringe Inject 0.5 mLs (25 mg total) into the skin every 3 (three) months. 06/23/23   Bensimhon, Bevelyn Buckles, MD  zolpidem (AMBIEN) 5 MG tablet Take 5 mg by mouth at bedtime as needed. 02/13/23   [provider]    Current Facility-Administered Medications  Medication Dose Route Frequency Provider Last Rate Last Admin   0.9 %  sodium chloride infusion (Manually program via Guardrails IV Fluids)   Intravenous Once Long, Arlyss Repress, MD       Current Outpatient Medications  Medication Sig Dispense Refill   allopurinol (ZYLOPRIM) 300 MG tablet Take 300 mg by mouth daily.     colchicine 0.6 MG tablet Take 0.6 mg by mouth as needed.     empagliflozin (JARDIANCE) 10 MG TABS tablet take 1 tablet by mouth daily before breakfast 90 tablet 3   Ensure Max Protein (ENSURE MAX PROTEIN) LIQD Take 330 mLs (11 oz total) by mouth at bedtime. 3330 mL 0   ferrous sulfate 325 (65 FE) MG tablet Take 1 tablet (325 mg total) by mouth daily with breakfast.  3   glucose blood (ONETOUCH VERIO) test strip TEST ONCE A DAY DX E11.29     loratadine (CLARITIN) 10 MG tablet Take 10 mg by mouth daily as needed for allergies.     midodrine (PROAMATINE) 10 MG tablet Take 1 tablet (10 mg total) by mouth 2 (two) times daily with a meal. 60 tablet 11   mirtazapine (REMERON) 15 MG tablet Take 15 mg by mouth at  bedtime.     Multiple Vitamins-Minerals (CERTAVITE/ANTIOXIDANTS) TABS Take 1 tablet by mouth daily. 30 tablet 0   pantoprazole (PROTONIX) 40 MG tablet Take 1 tablet (40 mg total) by mouth 2 (two) times daily. 60 tablet 0   Rivaroxaban (XARELTO) 15 MG TABS tablet Take 15 mg by mouth daily with supper.     rosuvastatin (CRESTOR) 5 MG tablet Take 5 mg by mouth every evening.     tadalafil, PAH, (ADCIRCA) 20 MG tablet Take 20 mg by mouth daily as needed (ED).  Tafamidis (VYNDAMAX) 61 MG CAPS Take 1 capsule by mouth daily. 90 capsule 3   tamsulosin (FLOMAX) 0.4 MG CAPS capsule Take 0.4 mg by mouth at bedtime.     torsemide (DEMADEX) 20 MG tablet Take 2 tablets (40 mg total) by mouth 2 (two) times daily for 2 days, THEN 2 tablets (40 mg total) daily. (Patient taking differently:  2 tablets (40 mg total) daily.) 90 tablet 3   Vitamin A 2400 MCG (8000 UT) CAPS Take 1 capsule by mouth daily.     vutrisiran sodium (AMVUTTRA) 25 MG/0.5ML syringe Inject 0.5 mLs (25 mg total) into the skin every 3 (three) months.     zolpidem (AMBIEN) 5 MG tablet Take 5 mg by mouth at bedtime as needed.      Allergies as of 07/18/2023 - Review Complete 07/18/2023  Allergen Reaction Noted   Lipitor [atorvastatin] Other (See Comments)    Penicillins Swelling 11/03/2016   Penicillin g  11/01/2021    Review of Systems:    Constitutional: No weight loss, fever, chills, weakness or fatigue HEENT: Eyes: No change in vision               Ears, Nose, Throat:  No change in hearing or congestion Skin: No rash or itching Cardiovascular: No chest pain, chest pressure or palpitations   Respiratory: No SOB or cough Gastrointestinal: See HPI and otherwise negative Genitourinary: No dysuria or change in urinary frequency Neurological: No headache, dizziness or syncope Musculoskeletal: No new muscle or joint pain Hematologic: No bleeding or bruising Psychiatric: No history of depression or anxiety     Physical Exam:  Vital  signs in last 24 hours: Temp:  [97.5 F (36.4 C)-97.9 F (36.6 C)] 97.5 F (36.4 C) (09/24 1508) Pulse Rate:  [74-78] 75 (09/24 1508) Resp:  [11-18] 12 (09/24 1508) BP: (77-97)/(54-63) 82/57 (09/24 1508) SpO2:  [98 %-100 %] 100 % (09/24 1508) Weight:  [74.6 kg] 74.6 kg (09/24 1044)   Last BM recorded by nurses in past 5 days No data recorded  General:  frail, chronically ill appearing male in no acute distress Head:  Normocephalic and atraumatic. Eyes: scleral icterus,conjunctive pink  Heart:  regular rate and rhythm Pulm: Clear anteriorly; no wheezing Abdomen:  Distended,  with ascites  AB, Hypoactive bowel sounds. No tenderness Without guarding and Without rebound, No organomegaly appreciated. Extremities:  With +2 bilateral lower extremity edema. Msk:  Symmetrical without gross deformities. Peripheral pulses intact.  Neurologic:  Alert and  oriented x4;  No focal deficits. No Asterixis. Skin:   Dry and intact without significant lesions or rashes. Psychiatric:  Cooperative. Normal mood and affect.  LAB RESULTS: Recent Labs    07/16/23 0250 07/17/23 0243 07/18/23 1250  WBC  --  4.6 7.6  HGB 10.3* 10.5* 7.1*  HCT 30.8* 32.2* 22.2*  PLT  --  321 242   BMET Recent Labs    07/17/23 0243 07/18/23 1250  NA 128* 125*  K 5.3* 5.0  CL 101 97*  CO2 23 19*  GLUCOSE 99 99  BUN 33* 42*  CREATININE 1.89* 2.10*  CALCIUM 8.1* 7.7*   LFT Recent Labs    07/18/23 1250  PROT 5.4*  ALBUMIN 2.0*  AST 31  ALT 7  ALKPHOS 71  BILITOT 1.4*   PT/INR Recent Labs    07/18/23 1250  LABPROT 38.5*  INR 3.9*    STUDIES: IR Paracentesis  Result Date: 07/17/2023 INDICATION: 66 year old male. Inpatient request. History of CHF with recurrent ascites.  Request is for therapeutic paracentesis EXAM: ULTRASOUND GUIDED THERAPEUTIC PARACENTESIS MEDICATIONS: Lidocaine 1% 10 mL COMPLICATIONS: None immediate. PROCEDURE: Informed written consent was obtained from the patient after a  discussion of the risks, benefits and alternatives to treatment. A timeout was performed prior to the initiation of the procedure. Initial ultrasound scanning demonstrates a small amount of ascites within the right lower abdominal quadrant. The right lower abdomen was prepped and draped in the usual sterile fashion. 1% lidocaine was used for local anesthesia. Following this, a 19 gauge, 7-cm, Yueh catheter was introduced. An ultrasound image was saved for documentation purposes. The paracentesis was performed. The catheter was removed and a dressing was applied. The patient tolerated the procedure well without immediate post procedural complication. FINDINGS: A total of approximately 900 mL of serosanguineous fluid was removed. IMPRESSION: Successful ultrasound-guided therapeutic LEFT paracentesis yielding 900 mL of peritoneal fluid. Performed by Anders Grant NP Electronically Signed   By: Roanna Banning M.D.   On: 07/17/2023 14:43      Impression /Plan:  67 year old male presents with near syncopal episode, acute on chronic anemia, Hgb 7.1, three points below from yesterday at 10.5. No blood in vault on DRE. Reports 1 dark stool yesterday. Recent intramuscular hematoma post paracentesis. Rule out persistent intramuscular bleed on Xarelto. Rule out GI bleed. - Will order CT with contrast, evaluate status of hematoma. -Consider EGD depending on results of imaging. -Trend H/H and transfuse as needed. -PPI therapy IV BID -Hold Xarelto for now  Cirrhosis without radiologic evidence of portal hypertension (varices, splenomegaly). Has ascites, but possibly cardiac related. Etiology of cirrhosis unclear etiology, but suspected congestive hepatopathy. PT 38.5/ INR 3.9   MELD 3.0: 38 at 07/18/2023 MELD-NA:34 at 07/18/2023 Last paracentesis was yesterday removed 900 mL. No fluid studies were done.  -Clear liquid diet.  2gm Na diet when diet advanced -hold diuretics for now, low threshold to resume -start  ceftriaxone 1g IV daily -Pantoprazole 40 mg BID daily IV -Possible diagnostic tap tomorrow after CT has resulted (follow-up on elevated abnormal acidic fluid cell count from 06/27/23).  Indeterminate liver lesion on previous admission MRI. -Check AFP marker -Repeat MRI recommended in 6 months outpt.   End-stage cardiac amyloidosis/  bicuspid AoV s/o bovine AVR/ history of 1v CABG   CKD III- renal function seems at baseline.  Hypotension,chronic. Stable. most likely secondary to severe cardiomyopathy and cirrhosis -baseline SBP 80s  Afib (on Xarelto at home)  Active Problems:   * No active hospital problems. *    LOS: 0 days   Thank you for your kind consultation, we will continue to follow.  Itati Brocksmith J Jamaury Gumz  07/18/2023, 3:31 PM

## 2023-07-18 NOTE — ED Notes (Signed)
Pt transported to/from CT by RN on continuous cardiac and pulse oximetry monitoring.

## 2023-07-18 NOTE — H&P (Signed)
History and Physical    ALEXYS ORRIS ZOX:[id number] DOB: 12-Aug-1956 DOA: 07/18/2023  DOS: the patient was seen and examined on 07/18/2023  PCP: Adrian Prince, MD   Patient coming from: Home  I have personally briefly reviewed patient's old medical records in [facility]  Mr. 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 He was hospitalized 9/20 to 07/17/23 for probable UGI bleed. He was transfused with a Hgb of 10.5 at discharre 07/17/23, GI consulted but did not feel endoscopy was indicated. He had received Protonix infusion and octreotide and Hgb stabilized. He was to take PPI bid at discharge. Patient's xarelto was held but was to restart at discharge. He was noted to have an intramuscular, rectus abdominis, hematoma during that admission. He was stable for discharge home 07/17/23.  Mr. Henschen reports that this AM he felt very light headed. Denies any overt signs of GI bleeding. He returned to [facility] for evaluation.    ED Course: 97.5  77/63 -to 93/57  HR 77 RR 18. At admission patient awake and alert in no acute distress. Lab: Na 125 (chronic), Cr 2.1 (chronic) Albumin 2.0, Total Protein 5.4 Lipase 96, Hgb 7.1, INR 3.9. CTA abdomen, no acute bleed, intramuscular heamtoma left rectus abdominis, Cirrhosis, recurrent ascites, cardiomegaly.  In ED patient was to get 1uPRBCs. TRH called to admit to manage ABLA and other medical problems  Review of Systems:  Review of Systems  Constitutional:  Positive for malaise/fatigue. Negative for chills, fever and weight loss.  HENT: Negative.    Eyes: Negative.   Respiratory:  Positive for shortness of breath. Negative for cough, hemoptysis and wheezing.   Cardiovascular:  Negative for chest pain, palpitations and leg swelling.   Gastrointestinal:  Positive for abdominal pain. Negative for blood in stool, diarrhea, heartburn, melena, nausea and vomiting.  Genitourinary: Negative.   Musculoskeletal: Negative.   Skin: Negative.   Neurological:  Positive for dizziness and weakness.  Endo/Heme/Allergies: Negative.   Psychiatric/Behavioral: Negative.      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   HLD (hyperlipidemia)    Hx of adenomatous polyp of colon 12/03/2020   Hypertension    Indwelling urinary catheter present 04/2023   OSA on CPAP    Sleep apnea     Past Surgical History:  Procedure Laterality Date   ABLATION     AORTIC VALVE REPLACEMENT  2011   CERVICAL DISC SURGERY     spinal stenosis   COLONOSCOPY     ESOPHAGOGASTRODUODENOSCOPY     IR PARACENTESIS  05/12/2023   IR PARACENTESIS  06/27/2023   IR PARACENTESIS  07/14/2023   IR PARACENTESIS  07/17/2023   RIGHT/LEFT HEART CATH AND CORONARY/GRAFT ANGIOGRAPHY N/A 03/18/2021   Procedure: RIGHT/LEFT HEART CATH AND CORONARY/GRAFT ANGIOGRAPHY;  Surgeon: Corky Crafts, MD;  Location: [facility];  Service: Cardiovascular;  Laterality: N/A;   TONSILLECTOMY      Soc Hx - married and lives with wife. He has two children, two grandchildren. He worked in [employer] in [place]. He is followed  by palliative-hospice care. Discussed cardiac resuscitation with him. He has previous discussion with Dr. Jones Broom. At this time DNR/DNI   reports that he quit smoking about 41 years ago. His smoking use included cigarettes. He started smoking about 51 years ago. He has a 2.5 pack-year smoking history. He has never used smokeless tobacco. He reports current alcohol use. He reports that he does  not use drugs.  Allergies  Allergen Reactions   Lipitor [Atorvastatin] Other (See Comments)    Body aches   Penicillins Swelling    **Tolerates cephalosporins Swollen face   Penicillin G     Other reaction(s): Unknown    Family History  Problem Relation Age of Onset   Congenital heart disease Mother    Diabetes Mother    Hypertension Mother    Heart disease Mother    Heart attack Father    Cervical cancer Sister    Diabetes Sister    Hypertension Brother    Diabetes Brother    Pancreatic cancer Brother    Diabetes Brother    Breast cancer Sister    Diabetes Sister    Diabetes Sister    Diabetes Sister    Diabetes Sister    Diabetes Son    Colon cancer Neg Hx    Colon polyps Neg Hx    Esophageal cancer Neg Hx    Stomach cancer Neg Hx     Prior to Admission medications   Medication Sig Start Date End Date Taking? Authorizing Provider  allopurinol (ZYLOPRIM) 300 MG tablet Take 300 mg by mouth daily. 11/23/19  Yes [provider]  colchicine 0.6 MG tablet Take 0.6 mg by mouth as needed. 02/14/23  Yes [provider]  empagliflozin (JARDIANCE) 10 MG TABS tablet take 1 tablet by mouth daily before breakfast 02/14/23  Yes Bensimhon, Bevelyn Buckles, MD  Ensure Max Protein (ENSURE MAX PROTEIN) LIQD Take 330 mLs (11 oz total) by mouth at bedtime. 12/20/21  Yes Sheikh, Omair Latif, DO  ferrous sulfate 325 (65 FE) MG tablet Take 1 tablet (325 mg total) by mouth daily with breakfast. 03/09/23  Yes [name], MD  loratadine (CLARITIN) 10 MG tablet Take 10 mg by mouth daily as needed for allergies.   Yes [provider]  midodrine (PROAMATINE) 10 MG tablet Take 1 tablet (10 mg total) by mouth 2 (two) times daily with a meal. 05/04/23  Yes Bensimhon, Bevelyn Buckles, MD  mirtazapine (REMERON) 15 MG tablet Take 15 mg by mouth at bedtime.   Yes [provider]  Multiple Vitamins-Minerals (CERTAVITE/ANTIOXIDANTS) TABS Take 1 tablet by mouth daily. 12/21/21  Yes  Sheikh, Omair Latif, DO  pantoprazole (PROTONIX) 40 MG tablet Take 1 tablet (40 mg total) by mouth 2 (two) times daily. 07/17/23 08/16/23 Yes Ghimire, Lyndel Safe, MD  Rivaroxaban (XARELTO) 15 MG TABS tablet Take 15 mg by mouth daily with supper.   Yes [provider]  rosuvastatin (CRESTOR) 5 MG tablet Take 5 mg by mouth every evening. 08/26/16  Yes [provider]  Tafamidis (VYNDAMAX) 61 MG CAPS Take 1 capsule by mouth daily. 09/08/22  Yes Bensimhon, Bevelyn Buckles, MD  tamsulosin (FLOMAX) 0.4 MG CAPS capsule Take 0.4 mg by mouth at bedtime. 06/09/23  Yes [provider]  torsemide (DEMADEX) 20 MG tablet Take 2 tablets (40 mg total) by mouth 2 (two) times daily for 2 days, THEN 2 tablets (40 mg total) daily. Patient taking differently:  2 tablets (40 mg total) daily. 06/02/23 07/18/23 Yes Bensimhon, Reuel Boom  R, MD  Vitamin A 2400 MCG (8000 UT) CAPS Take 1 capsule by mouth daily. 06/29/23  Yes Bensimhon, Bevelyn Buckles, MD  vutrisiran sodium (AMVUTTRA) 25 MG/0.5ML syringe Inject 0.5 mLs (25 mg total) into the skin every 3 (three) months. 06/23/23  Yes Bensimhon, Bevelyn Buckles, MD  zolpidem (AMBIEN) 5 MG tablet Take 5 mg by mouth at bedtime as needed. 02/13/23  Yes [provider]  glucose blood (ONETOUCH VERIO) test strip TEST ONCE A DAY DX E11.29    [provider]  tadalafil, PAH, (ADCIRCA) 20 MG tablet Take 20 mg by mouth daily as needed (ED). Patient not taking: Reported on 07/18/2023 05/06/21   [provider]    Physical Exam: Vitals:   07/18/23 1508 07/18/23 1545 07/18/23 1716 07/18/23 1800  BP: (!) 82/57 (!) 93/57 91/65 93/67   Pulse: 75 77 77 75  Resp: 12 18 16 20   Temp: (!) 97.5 F (36.4 C)  97.7 F (36.5 C)   TempSrc: Oral  Oral   SpO2: 100% 100% 100% 100%  Weight:      Height:        Physical Exam Vitals and nursing note reviewed.  Constitutional:      General: He is not in acute distress.    Appearance: He is ill-appearing.     Comments: Thin and  gaunt  HENT:     Head: Normocephalic and atraumatic.     Mouth/Throat:     Mouth: Mucous membranes are moist.     Pharynx: Oropharynx is clear. No oropharyngeal exudate.  Eyes:     General: No scleral icterus.    Extraocular Movements: Extraocular movements intact.     Conjunctiva/sclera: Conjunctivae normal.     Pupils: Pupils are equal, round, and reactive to light.  Cardiovascular:     Rate and Rhythm: Normal rate. Rhythm irregular.     Pulses: Normal pulses.     Heart sounds: Normal heart sounds. No murmur heard.    No gallop.  Pulmonary:     Effort: Pulmonary effort is normal.     Breath sounds: Normal breath sounds. No rales.  Abdominal:     General: There is distension.     Tenderness: There is abdominal tenderness.     Comments: Abdomen distended and tense. BS hypoactive. Tenderness to light palpation left mid-lower quadrant at sight of recent paracentesis. Tense ascites hinders palpation.   Musculoskeletal:        General: Normal range of motion.     Cervical back: Normal range of motion and neck supple.     Right lower leg: Edema present.     Left lower leg: Edema present.     Comments: 2 plus edema to mid-calve  Skin:    General: Skin is warm and dry.     Coloration: Skin is not jaundiced.  Neurological:     General: No focal deficit present.     Mental Status: He is alert and oriented to person, place, and time.  Psychiatric:        Mood and Affect: Mood normal.        Behavior: Behavior normal.      Labs on Admission: I have personally reviewed following labs and imaging studies  CBC: Recent Labs  Lab 07/14/23 1621 07/15/23 0150 07/15/23 1741 07/15/23 2108 07/16/23 0250 07/17/23 0243 07/18/23 1250  WBC 4.5  --   --   --   --  4.6 7.6  NEUTROABS  --   --   --   --   --  3.7 6.9  HGB 9.5*   < > 11.4* 10.2* 10.3* 10.5* 7.1*  HCT 29.3*   < > 35.1* 31.0* 30.8* 32.2* 22.2*  MCV 89.9  --   --   --   --  88.0 91.0  PLT 339  --   --   --   --  321 242    < > = values in this interval not displayed.   Basic Metabolic Panel: Recent Labs  Lab 07/14/23 1621 07/15/23 0150 07/17/23 0243 07/18/23 1250  NA 128* 128* 128* 125*  K 4.3 4.0 5.3* 5.0  CL 97* 96* 101 97*  CO2 24 20* 23 19*  GLUCOSE 109* 127* 99 99  BUN 48* 43* 33* 42*  CREATININE 2.23* 2.04* 1.89* 2.10*  CALCIUM 8.2* 8.1* 8.1* 7.7*  MG  --   --  2.3  --    GFR: Estimated Creatinine Clearance: 35 mL/min (A) (by C-G formula based on SCr of 2.1 mg/dL (H)). Liver Function Tests: Recent Labs  Lab 07/14/23 1621 07/15/23 0150 07/17/23 0243 07/18/23 1250  AST 43* 37 29 31  ALT 11 10 8 7   ALKPHOS 121 89 89 71  BILITOT 1.5* 1.4* 1.2 1.4*  PROT 7.5 6.4* 6.1* 5.4*  ALBUMIN 2.7* 2.7* 2.3* 2.0*   Recent Labs  Lab 07/18/23 1250  LIPASE 96*   Recent Labs  Lab 07/15/23 0150  AMMONIA 21   Coagulation Profile: Recent Labs  Lab 07/14/23 1621 07/15/23 0150 07/18/23 1250  INR 2.1* 1.9* 3.9*   Cardiac Enzymes: No results for input(s): "CKTOTAL", "CKMB", "CKMBINDEX", "TROPONINI" in the last 168 hours. BNP (last 3 results) No results for input(s): "PROBNP" in the last 8760 hours. HbA1C: No results for input(s): "HGBA1C" in the last 72 hours. CBG: Recent Labs  Lab 07/17/23 0027 07/17/23 0421 07/17/23 0618 07/17/23 0733 07/17/23 1155  GLUCAP 89 96 110* 94 86   Lipid Profile: No results for input(s): "CHOL", "HDL", "LDLCALC", "TRIG", "CHOLHDL", "LDLDIRECT" in the last 72 hours. Thyroid Function Tests: No results for input(s): "TSH", "T4TOTAL", "FREET4", "T3FREE", "THYROIDAB" in the last 72 hours. Anemia Panel: No results for input(s): "VITAMINB12", "FOLATE", "FERRITIN", "TIBC", "IRON", "RETICCTPCT" in the last 72 hours. Urine analysis:    Component Value Date/Time   COLORURINE YELLOW 11/04/2016 0508   APPEARANCEUR CLEAR 11/04/2016 0508   LABSPEC 1.019 11/04/2016 0508   PHURINE 6.0 11/04/2016 0508   GLUCOSEU NEGATIVE 11/04/2016 0508   HGBUR NEGATIVE 11/04/2016  0508   BILIRUBINUR NEGATIVE 11/04/2016 0508   KETONESUR NEGATIVE 11/04/2016 0508   PROTEINUR NEGATIVE 11/04/2016 0508   NITRITE NEGATIVE 11/04/2016 0508   LEUKOCYTESUR NEGATIVE 11/04/2016 0508    Radiological Exams on Admission: I have personally reviewed images IR Paracentesis  Result Date: 07/17/2023 INDICATION: 67 year old male. Inpatient request. History of CHF with recurrent ascites. Request is for therapeutic paracentesis EXAM: ULTRASOUND GUIDED THERAPEUTIC PARACENTESIS MEDICATIONS: Lidocaine 1% 10 mL COMPLICATIONS: None immediate. PROCEDURE: Informed written consent was obtained from the patient after a discussion of the risks, benefits and alternatives to treatment. A timeout was performed prior to the initiation of the procedure. Initial ultrasound scanning demonstrates a small amount of ascites within the right lower abdominal quadrant. The right lower abdomen was prepped and draped in the usual sterile fashion. 1% lidocaine was used for local anesthesia. Following this, a 19 gauge, 7-cm, Yueh catheter was introduced. An ultrasound image was saved for documentation purposes. The paracentesis was performed. The catheter was removed and a dressing was applied. The patient tolerated the  procedure well without immediate post procedural complication. FINDINGS: A total of approximately 900 mL of serosanguineous fluid was removed. IMPRESSION: Successful ultrasound-guided therapeutic LEFT paracentesis yielding 900 mL of peritoneal fluid. Performed by Anders Grant NP Electronically Signed   By: Roanna Banning M.D.   On: 07/17/2023 14:43    EKG: I have personally reviewed EKG: sinus vs ectopic atrial rhytm, irregular, RBBB, no acute changes  Assessment/Plan Principal Problem:   Acute blood loss anemia (ABLA) Active Problems:   CAD (coronary artery disease)   PAF (paroxysmal atrial fibrillation) (HCC)   Benign essential hypertension   OSA on CPAP   Type 2 diabetes mellitus (HCC)   CKD  (chronic kidney disease), stage II   Chronic combined systolic (congestive) and diastolic (congestive) heart failure (HCC)    Assessment and Plan: * Acute blood loss anemia (ABLA) Patient with very recent GI bleed with black tarry stools. Since d/c home he has had light to clay colored stools. No hematemasis. He did restart Xarelto last PM. He does endorse some abdominal pain. He had a recent finding of hematoma in the left rectus abdominis muscle. He is tender to palpation left lower abdomen. He has tense ascites making definitive palpation difficult. EDP did order 1 uPRBCs  Plan 2nd uPRBCs in setting of active bleed  H/H q8 x 3  CT abdomen/pelvis to assess for continued/progressive hematoma left abdominis  GI consult - defer endoscopy recommendation to them.  Chronic combined systolic (congestive) and diastolic (congestive) heart failure (HCC) Last ECHO 12/17/21 with EF 45-50%, Grade III DD. Patient follows with Dr. Jones Broom in the heart failure clinic.   Plan  Continue home meds but will adjust for hypotension.  Will given 20 mg Lasix after 2nd uPRBCs  CKD (chronic kidney disease), stage II Stable CKD III.  Plan Avoid nephrotoxic drugs  Avoid hypotension.   Type 2 diabetes mellitus (HCC) Serum glucose at admission 99.  Plan Continue Jardiance -   Sliding scale  OSA on CPAP Will continue CPAP  Benign essential hypertension BP is running low due to acute blood loss.  Plan  Hold BP medications for SBP <100  PAF (paroxysmal atrial fibrillation) (HCC) Patient remains in a. Fib with controlled rate.  Plan Hold Xarelto  Continue home regimen  CAD (coronary artery disease) Patient denies any chest pain. He follows with cardiology.  Plan Continue home cardiac meds       DVT prophylaxis:  TEDs Code Status: DNR/DNI(Do NOT Intubate) Family Communication: spoke with Mrs. Galati  Disposition Plan: home with palliative care when stable  Consults called: GI - [place]   Admission status: Inpatient, Telemetry bed   Illene Regulus, MD [facility] 07/18/2023, 6:32 PM

## 2023-07-18 NOTE — Assessment & Plan Note (Signed)
Patient denies any chest pain. He follows with cardiology.  Plan Continue home cardiac meds

## 2023-07-18 NOTE — ED Provider Notes (Signed)
Emergency Department Provider Note   I have reviewed the triage vital signs and the nursing notes.   HISTORY  Chief Complaint Hypotension and Dizziness   HPI Frank Berg is a 67 y.o. male with past history of atrial fibrillation, CAD, diabetes, cirrhosis presents to the emergency department with lightheadedness this morning.  He reports occasionally having issues with low blood pressure and took his midodrine this morning as prescribed.  He has been trying to stay hydrated.  He was discharged from the hospital yesterday with low hemoglobin requiring PRBC transfusion.  GI did not recommend further endoscopy.  He had 900 mL of ascites fluid drawn from his left abdomen.  Denies abdominal pain at this time or fever.  Has had some occasional cramping discomfort in his abdomen overnight but that is resolved.  No chest pain or shortness of breath.  He has not appreciated any bleeding from his bowels. Has not been vomiting blood.    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 Positive weakness and lightheadedness.  Cardiovascular: Denies chest pain. Respiratory: Denies shortness of breath. Gastrointestinal: No abdominal pain.  No nausea, no vomiting.  No diarrhea.   Musculoskeletal: Negative for back pain. Skin: Negative for rash. Neurological: Negative for headaches, focal weakness or numbness.  ____________________________________________   PHYSICAL EXAM:  VITAL SIGNS: ED Triage Vitals   Encounter Vitals Group     BP 07/18/23 1041 (!) 97/54     Pulse Rate 07/18/23 1041 78     Resp 07/18/23 1041 16     Temp 07/18/23 1126 (!) 97.5 F (36.4 C)     Temp Source 07/18/23 1126 Oral     SpO2 07/18/23 1041 100 %     Weight 07/18/23 1044 164 lb 7.4 oz (74.6 kg)     Height 07/18/23 1044 6\' 2"  (1.88 m)   Constitutional: Alert and oriented. Chronically ill-appearing but able to provide a full history.  Eyes: Conjunctivae are normal.  Head: Atraumatic. Nose: No congestion/rhinnorhea. Mouth/Throat: Mucous membranes are moist.  Neck: No stridor.   Cardiovascular: Normal rate, regular rhythm. Good peripheral circulation. Grossly normal heart sounds.   Respiratory: Normal respiratory effort.  No retractions. Lungs CTAB. Gastrointestinal: Soft and nontender. Mild distention with fluid wave. RLQ bandage from LVP yesterday. Wound is well-appearing.  Musculoskeletal: No lower extremity tenderness nor edema. No gross deformities of extremities. Neurologic:  Normal speech and language. Skin:  Skin is warm, dry and intact. No rash noted.   ____________________________________________   LABS (all labs ordered are listed, but only abnormal results are displayed)  Labs Reviewed  CBC WITH DIFFERENTIAL/PLATELET - Abnormal; Notable for the following components:      Result Value   RBC 2.44 (*)    Hemoglobin 7.1 (*)    HCT 22.2 (*)    RDW 18.2 (*)    Lymphs Abs 0.3 (*)    All other components within normal limits  COMPREHENSIVE METABOLIC PANEL - Abnormal; Notable for the  following components:   Sodium 125 (*)    Chloride 97 (*)    CO2 19 (*)    BUN 42 (*)    Creatinine, Ser 2.10 (*)    Calcium 7.7 (*)    Total Protein 5.4 (*)    Albumin 2.0 (*)    Total Bilirubin 1.4 (*)    GFR, Estimated 33 (*)    All other components within normal limits  LIPASE, BLOOD - Abnormal; Notable for the following components:   Lipase 96 (*)    All other components within normal limits  PROTIME-INR  - Abnormal; Notable for the following components:   Prothrombin Time 38.5 (*)    INR 3.9 (*)    All other components within normal limits  BRAIN NATRIURETIC PEPTIDE - Abnormal; Notable for the following components:   B Natriuretic Peptide 484.6 (*)    All other components within normal limits  HEMOGLOBIN AND HEMATOCRIT, BLOOD - Abnormal; Notable for the following components:   Hemoglobin 8.4 (*)    HCT 26.5 (*)    All other components within normal limits  HEMOGLOBIN AND HEMATOCRIT, BLOOD - Abnormal; Notable for the following components:   Hemoglobin 9.2 (*)    HCT 26.9 (*)    All other components within normal limits  HEMOGLOBIN AND HEMATOCRIT, BLOOD - Abnormal; Notable for the following components:   Hemoglobin 8.9 (*)    HCT 26.0 (*)    All other components within normal limits  HEMOGLOBIN AND HEMATOCRIT, BLOOD - Abnormal; Notable for the following components:   Hemoglobin 9.7 (*)    HCT 29.3 (*)    All other components within normal limits  PROTIME-INR - Abnormal; Notable for the following components:   Prothrombin Time 20.0 (*)    INR 1.7 (*)    All other components within normal limits  CBC - Abnormal; Notable for the following components:   RBC 3.08 (*)    Hemoglobin 9.1 (*)    HCT 26.2 (*)    RDW 17.1 (*)    All other components within normal limits  COMPREHENSIVE METABOLIC PANEL - Abnormal; Notable for the following components:   Sodium 126 (*)    Chloride 97 (*)    CO2 17 (*)    BUN 49 (*)    Creatinine, Ser 2.42 (*)    Calcium 8.0 (*)    Total Protein 5.7 (*)    Albumin 2.0 (*)    Total Bilirubin 1.8 (*)    GFR, Estimated 28 (*)    All other components within normal limits  RETICULOCYTES - Abnormal; Notable for the following components:   RBC. 3.16 (*)    Immature Retic Fract 19.5 (*)    All other components within normal limits  BRAIN NATRIURETIC PEPTIDE - Abnormal; Notable for the following components:   B Natriuretic Peptide 527.3 (*)    All other  components within normal limits  TSH - Abnormal; Notable for the following components:   TSH 5.121 (*)    All other components within normal limits  URINALYSIS, ROUTINE W REFLEX MICROSCOPIC - Abnormal; Notable for the following components:   Color, Urine AMBER (*)    APPearance CLOUDY (*)    Glucose, UA >=500 (*)    Hgb urine dipstick MODERATE (*)    Leukocytes,Ua LARGE (*)    Bacteria, UA RARE (*)    All other components within normal limits  POC OCCULT BLOOD, ED - Abnormal; Notable for the following components:   Fecal Occult Bld POSITIVE (*)  All other components within normal limits  AFP TUMOR MARKER  FOLATE  MAGNESIUM  PROCALCITONIN  CORTISOL  OSMOLALITY  CBC WITH DIFFERENTIAL/PLATELET  CREATININE, URINE, RANDOM  CBC WITH DIFFERENTIAL/PLATELET  BRAIN NATRIURETIC PEPTIDE  BASIC METABOLIC PANEL  MAGNESIUM  C-REACTIVE PROTEIN  PROCALCITONIN  SODIUM, URINE, RANDOM  OSMOLALITY, URINE  TSH  T4, FREE  TYPE AND SCREEN  PREPARE RBC (CROSSMATCH)  PREPARE RBC (CROSSMATCH)  BLOOD TRANSFUSION REPORT - SCANNED   Narrative:    Ordered by an unspecified provider.   ____________________________________________  EKG   EKG Interpretation Date/Time:  Tuesday July 18 2023 10:41:16 EDT Ventricular Rate:  81 PR Interval:  158 QRS Duration:  138 QT Interval:  435 QTC Calculation: 505 R Axis:   -28  Text Interpretation: Sinus or ectopic atrial rhythm Right bundle branch block Similar to September 21st tracing Confirmed by Alona Bene [phone]) on 07/18/2023 10:50:03 AM       ____________________________________________   PROCEDURES  Procedure(s) performed:   Procedures  CRITICAL CARE Performed by: Maia Plan Total critical care time: 35 minutes Critical care time was exclusive of separately billable procedures and treating other patients. Critical care was necessary to treat or prevent imminent or life-threatening deterioration. Critical care was time  spent personally by me on the following activities: development of treatment plan with patient and/or surrogate as well as nursing, discussions with consultants, evaluation of patient's response to treatment, examination of patient, obtaining history from patient or surrogate, ordering and performing treatments and interventions, ordering and review of laboratory studies, ordering and review of radiographic studies, pulse oximetry and re-evaluation of patient's condition.  Alona Bene, MD Emergency Medicine  ____________________________________________   INITIAL IMPRESSION / ASSESSMENT AND PLAN / ED COURSE  Pertinent labs & imaging results that were available during my care of the patient were reviewed by me and considered in my medical decision making (see chart for details).   This patient is Presenting for Evaluation of weakness, which does require a range of treatment options, and is a complaint that involves a high risk of morbidity and mortality.  The Differential Diagnoses include dehydration, blood loss anemia, AKI, etc.  Critical Interventions-    Medications  pantoprazole (PROTONIX) injection 40 mg (40 mg Intravenous Given 07/20/23 2135)  cefTRIAXone (ROCEPHIN) 1 g in sodium chloride 0.9 % 100 mL IVPB (1 g Intravenous New Bag/Given 07/20/23 1504)  allopurinol (ZYLOPRIM) tablet 300 mg (300 mg Oral Given 07/20/23 0921)  colchicine tablet 0.6 mg (has no administration in time range)  rosuvastatin (CRESTOR) tablet 5 mg (5 mg Oral Given 07/20/23 1708)  Tafamidis CAPS 61 mg -Home medication (61 mg Oral Given 07/20/23 0937)  mirtazapine (REMERON) tablet 15 mg (15 mg Oral Given 07/20/23 2135)  zolpidem (AMBIEN) tablet 5 mg (has no administration in time range)  tamsulosin (FLOMAX) capsule 0.4 mg (0.4 mg Oral Given 07/20/23 2135)  protein supplement (ENSURE MAX) liquid (11 oz Oral Given 07/20/23 2235)  0.9 %  sodium chloride infusion ( Intravenous New Bag/Given 07/19/23 0322)  Chlorhexidine  Gluconate Cloth 2 % PADS 6 each (6 each Topical Given 07/20/23 1058)  midodrine (PROAMATINE) tablet 10 mg (10 mg Oral Given 07/20/23 1708)  albumin human 25 % solution 50 g (50 g Intravenous New Bag/Given 07/20/23 2158)  bacitracin ointment (has no administration in time range)  senna (SENOKOT) tablet 8.6 mg (8.6 mg Oral Given 07/20/23 1504)  polyethylene glycol (MIRALAX / GLYCOLAX) packet 17 g (17 g Oral Patient Refused/Not Given 07/20/23 1623)  sodium  chloride 0.9 % bolus 500 mL (0 mLs Intravenous Stopped 07/18/23 1249)  acetaminophen (TYLENOL) tablet 650 mg (650 mg Oral Given 07/18/23 1341)  0.9 %  sodium chloride infusion (Manually program via Guardrails IV Fluids) (0 mLs Intravenous Stopped 07/18/23 1720)  iohexol (OMNIPAQUE) 350 MG/ML injection 60 mL (60 mLs Intravenous Contrast Given 07/18/23 1712)  0.9 %  sodium chloride infusion (Manually program via Guardrails IV Fluids) ( Intravenous New Bag/Given 07/19/23 0323)  furosemide (LASIX) injection 20 mg (20 mg Intravenous Given 07/18/23 2345)  lidocaine (XYLOCAINE) 1 % (with pres) injection 20 mL (10 mLs Intradermal Given 07/19/23 1023)  phytonadione (VITAMIN K) 10 mg in dextrose 5 % 50 mL IVPB (0 mg Intravenous Stopped 07/19/23 1439)  prothrombin complex conc human (KCENTRA) IVPB 3,678 Units (0 Units Intravenous Stopped 07/19/23 1247)  sodium chloride 0.9 % bolus 500 mL (500 mLs Intravenous New Bag/Given 07/19/23 1522)  midodrine (PROAMATINE) tablet 10 mg (10 mg Oral Given 07/19/23 2215)    Reassessment after intervention: BP improving with PRBC transfusion.    I did obtain Additional Historical Information from wife at bedside.     Clinical Laboratory Tests Ordered, included CBC with Hb of 7.1. FOBT positive. CMP with hyponatremia to 125.   Radiologic Tests Ordered, included CT abdomen/pelvis. I independently interpreted the images and agree with radiology interpretation.   Cardiac Monitor Tracing which shows NSR.   Consult complete with  [facility]. They will consult while admitted.   TRH with plan for admit.   Medical Decision Making: Summary:  Patient with low BP and lightheadedness. Will repeat labs with history of GI bleeding.   Reevaluation with update and discussion with patient and wife at bedside. PRBC rapidly down-trending compared to yesterday. Repeat blood sample sent to confirm. Wife ok with additional PRBC transfusion. Plan for admit for further monitoring and GI consultation.   Patient's presentation is most consistent with acute presentation with potential threat to life or bodily function.   Disposition: admit  ____________________________________________  FINAL CLINICAL IMPRESSION(S) / ED DIAGNOSES  Final diagnoses:  Symptomatic anemia  Hypotension, unspecified hypotension type    Note:  This document was prepared using Dragon voice recognition software and may include unintentional dictation errors.  Alona Bene, MD, [facility] Emergency [facility]    Treyvion Durkee, Arlyss Repress, MD 07/21/23 [phone]

## 2023-07-18 NOTE — Assessment & Plan Note (Signed)
Stable CKD III.  Plan Avoid nephrotoxic drugs  Avoid hypotension.

## 2023-07-18 NOTE — Assessment & Plan Note (Signed)
BP is running low due to acute blood loss.  Plan  Hold BP medications for SBP <100

## 2023-07-18 NOTE — Assessment & Plan Note (Signed)
Last ECHO 12/17/21 with EF 45-50%, Grade III DD. Patient follows with Dr. Jones Broom in the heart failure clinic.   Plan  Continue home meds but will adjust for hypotension.  Will given 20 mg Lasix after 2nd uPRBCs

## 2023-07-18 NOTE — Assessment & Plan Note (Signed)
Will continue CPAP

## 2023-07-18 NOTE — ED Triage Notes (Addendum)
Patient presents via EMS from home. Last paracentesis yesterday. Patient began feeling weak last night and had a pre-syncopal episode approximately 5 hours prior to arrival. Per EMS, patient feels okay when lying flat but feels dizzy and light-headed when he stands. Patient is AAOx4. Patient has a pre-existing foley catheter. Patient reports that he has a history of hypotension and took a dose of midodrine this morning (takes twice daily). Patient reports that he has had intermittent generalized abdominal pain since last night.

## 2023-07-18 NOTE — Subjective & Objective (Signed)
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 He was hospitalized 9/20 to 07/17/23 for probable UGI bleed. He was transfused with a Hgb of 10.5 at discharre 07/17/23, GI consulted but did not feel endoscopy was indicated. He had received Protonix infusion and octreotide and Hgb stabilized. He was to take PPI bid at discharge. Patient's xarelto was held but was to restart at discharge. He was noted to have an intramuscular, rectus abdominis, hematoma during that admission. He was stable for discharge home 07/17/23.  [name] reports that this AM he felt very light headed. Denies any overt signs of GI bleeding. He returned to [facility] for evaluation.

## 2023-07-18 NOTE — Assessment & Plan Note (Signed)
Patient with very recent GI bleed with black tarry stools. Since d/c home he has had light to clay colored stools. No hematemasis. He did restart Xarelto last PM. He does endorse some abdominal pain. He had a recent finding of hematoma in the left rectus abdominis muscle. He is tender to palpation left lower abdomen. He has tense ascites making definitive palpation difficult. EDP did order 1 uPRBCs  Plan 2nd uPRBCs in setting of active bleed  H/H q8 x 3  CT abdomen/pelvis to assess for continued/progressive hematoma left abdominis  GI consult - defer endoscopy recommendation to them.

## 2023-07-18 NOTE — Assessment & Plan Note (Signed)
Serum glucose at admission 99.  Plan Continue Jardiance -   Sliding scale

## 2023-07-18 NOTE — Assessment & Plan Note (Signed)
Patient remains in a. Fib with controlled rate.  Plan Hold Xarelto  Continue home regimen

## 2023-07-19 ENCOUNTER — Inpatient Hospital Stay (HOSPITAL_COMMUNITY): Payer: Medicare HMO

## 2023-07-19 DIAGNOSIS — D649 Anemia, unspecified: Secondary | ICD-10-CM | POA: Diagnosis not present

## 2023-07-19 DIAGNOSIS — Z7189 Other specified counseling: Secondary | ICD-10-CM

## 2023-07-19 DIAGNOSIS — I959 Hypotension, unspecified: Secondary | ICD-10-CM

## 2023-07-19 DIAGNOSIS — I5042 Chronic combined systolic (congestive) and diastolic (congestive) heart failure: Secondary | ICD-10-CM

## 2023-07-19 DIAGNOSIS — K661 Hemoperitoneum: Secondary | ICD-10-CM

## 2023-07-19 DIAGNOSIS — Z515 Encounter for palliative care: Secondary | ICD-10-CM

## 2023-07-19 DIAGNOSIS — D62 Acute posthemorrhagic anemia: Secondary | ICD-10-CM | POA: Diagnosis not present

## 2023-07-19 DIAGNOSIS — K7460 Unspecified cirrhosis of liver: Secondary | ICD-10-CM | POA: Diagnosis not present

## 2023-07-19 HISTORY — PX: IR PARACENTESIS: IMG2679

## 2023-07-19 LAB — TYPE AND SCREEN
ABO/RH(D): B POS
Antibody Screen: NEGATIVE
Unit division: 0
Unit division: 0

## 2023-07-19 LAB — BPAM RBC
Blood Product Expiration Date: 202410262359
Blood Product Expiration Date: 202410292359
ISSUE DATE / TIME: 202409241439
ISSUE DATE / TIME: 202409241856
Unit Type and Rh: 7300
Unit Type and Rh: 7300

## 2023-07-19 LAB — AFP TUMOR MARKER: AFP, Serum, Tumor Marker: 2.2 ng/mL (ref 0.0–8.4)

## 2023-07-19 LAB — HEMOGLOBIN AND HEMATOCRIT, BLOOD
HCT: 26.9 % — ABNORMAL LOW (ref 39.0–52.0)
HCT: 26 % — ABNORMAL LOW (ref 39.0–52.0)
HCT: 29.3 % — ABNORMAL LOW (ref 39.0–52.0)
Hemoglobin: 9.2 g/dL — ABNORMAL LOW (ref 13.0–17.0)
Hemoglobin: 8.9 g/dL — ABNORMAL LOW (ref 13.0–17.0)
Hemoglobin: 9.7 g/dL — ABNORMAL LOW (ref 13.0–17.0)

## 2023-07-19 LAB — PROTIME-INR
INR: 1.7 — ABNORMAL HIGH (ref 0.8–1.2)
Prothrombin Time: 20 seconds — ABNORMAL HIGH (ref 11.4–15.2)

## 2023-07-19 MED ORDER — CHLORHEXIDINE GLUCONATE CLOTH 2 % EX PADS
6.0000 | MEDICATED_PAD | Freq: Every day | CUTANEOUS | Status: DC
  Administered 2023-07-19 – 2023-07-24 (×6): 6 via TOPICAL

## 2023-07-19 MED ORDER — LIDOCAINE HCL 1 % IJ SOLN
20.0000 mL | Freq: Once | INTRAMUSCULAR | Status: AC
  Administered 2023-07-19: 10 mL via INTRADERMAL
  Filled 2023-07-19: qty 20

## 2023-07-19 MED ORDER — SODIUM CHLORIDE 0.9 % IV BOLUS
500.0000 mL | Freq: Once | INTRAVENOUS | Status: AC
  Administered 2023-07-19: 500 mL via INTRAVENOUS

## 2023-07-19 MED ORDER — MIDODRINE HCL 5 MG PO TABS
10.0000 mg | ORAL_TABLET | Freq: Three times a day (TID) | ORAL | Status: DC
  Administered 2023-07-20 – 2023-07-21 (×5): 10 mg via ORAL
  Filled 2023-07-19 (×5): qty 2

## 2023-07-19 MED ORDER — MIDODRINE HCL 5 MG PO TABS: 10.0000 mg | ORAL_TABLET | Freq: Three times a day (TID) | ORAL | Status: DC

## 2023-07-19 MED ORDER — LIDOCAINE HCL 1 % IJ SOLN
INTRAMUSCULAR | Status: AC
  Filled 2023-07-19: qty 20

## 2023-07-19 MED ORDER — PROTHROMBIN COMPLEX CONC HUMAN 500 UNITS IV KIT
3678.0000 [IU] | PACK | Status: AC
  Administered 2023-07-19: 3678 [IU] via INTRAVENOUS
  Filled 2023-07-19: qty 3678

## 2023-07-19 MED ORDER — VITAMIN K1 10 MG/ML IJ SOLN
10.0000 mg | Freq: Once | INTRAVENOUS | Status: AC
  Administered 2023-07-19: 10 mg via INTRAVENOUS
  Filled 2023-07-19: qty 1

## 2023-07-19 MED ORDER — MIDODRINE HCL 5 MG PO TABS
10.0000 mg | ORAL_TABLET | Freq: Three times a day (TID) | ORAL | Status: AC
  Administered 2023-07-19 (×2): 10 mg via ORAL
  Filled 2023-07-19 (×2): qty 2

## 2023-07-19 NOTE — Consult Note (Signed)
Consultation Note Date: 07/19/2023   Patient Name: Frank Berg  DOB: 02/06/1956  MRN: [id number]  Age / Sex: 67 y.o., male  PCP: Adrian Prince, MD Referring Physician: Burnadette Pop, MD  Reason for Consultation: Establishing goals of care  HPI/Patient Profile: 67 y.o. 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 Hgb from 10.5 at d/c 9/23 to 7.1 9/24 and received 2U PRBCs.  Clinical Assessment and Goals of Care: Consult received and chart review completed. I met today with Frank Berg. He is lying in bed and has just recently been admitted into his hospital room. He is tired. He has no specific complaints and denies pain/discomfort but tired from the events from today and being in ED. His vital signs are more stable and he has good understanding of his condition and plan of care. He knows that he is very ill but is hopeful that  his bleeding can improve stopping Xarelto. He understands his overall poor prognosis but wishes to continue with hospitalization and care with hopes of returning back home with his wife. He is hoping he can stay home longer this time before needing readmission. DNR is in place. We did not at this time discuss hospice as this is not aligned with his stated wishes and goals he has expressed to me at this time. He gives me permission to speak with his wife.  I attempted to call wife but no answer. I did not leave voicemail as it is not an emergency and I am leaving for the evening shortly. I will follow up with them tomorrow.    Primary Decision Maker PATIENT    SUMMARY OF RECOMMENDATIONS   - Hopeful for improvement in bleeding - Hopeful for return home - Open to rehospitalization as needed and ongoing treatment to optimize  Code Status/Advance Care Planning: DNR   Symptom Management:  Per attending, GI, IR  Prognosis:  Overall prognosis poor.   Discharge Planning: To Be Determined      Primary Diagnoses: Present on Admission:  CAD (coronary artery disease)  PAF (paroxysmal atrial fibrillation) (HCC)  Benign essential hypertension  CKD (chronic kidney disease), stage II  Chronic combined systolic (congestive) and diastolic (congestive) heart failure (HCC)  Acute blood loss anemia (ABLA)   I have reviewed the medical record, interviewed the patient and family, and examined the patient. The following aspects are pertinent.  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   HLD (hyperlipidemia)  Hx of adenomatous polyp of colon 12/03/2020   Hypertension    Indwelling urinary catheter present 04/2023   OSA on CPAP    Sleep apnea    Social History   Socioeconomic History   Marital status: Single    Spouse name: Not on file   Number of children: 2   Years of education: Not on file   Highest education level: Not on file  Occupational History   Occupation: retired  Tobacco Use   Smoking status: Former    Current packs/day: 0.00    Average packs/day: 0.3 packs/day for 10.0 years (2.5 ttl pk-yrs)    Types: Cigarettes    Start date: 11/04/1971    Quit date: 11/03/1981    Years since quitting: 41.7   Smokeless tobacco: Never  Vaping Use   Vaping status: Never Used  Substance and Sexual Activity   Alcohol use: Yes     Comment: occasional, none since December 2023   Drug use: No   Sexual activity: Not on file  Other Topics Concern   Not on file  Social History Narrative   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 to the [place] area 2017   Married 1 son 1 daughter   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   Other jobs including working at the [employer], providing health care for disabled, 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, 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   Social Determinants of Health   Financial Resource Strain: Low Risk  (07/06/2022)   [facility]   Overall Financial Resource Strain (CARDIA)    Difficulty of Paying Living Expenses: Not hard at all  Food Insecurity: No Food Insecurity (07/15/2023)   Hunger Vital Sign    Worried About Running Out of Food in the Last Year: Never true    Ran Out of Food in the Last Year: Never true  Transportation Needs: No Transportation Needs (07/15/2023)   PRAPARE - Administrator, Civil Service (Medical): No    Lack of Transportation (Non-Medical): No  Physical Activity: Not on file  Stress: Not on file  Social Connections: Unknown (03/07/2022)   [facility]   Social Network    Social Network: Not on file   Family History  Problem Relation Age of Onset   Congenital heart disease Mother    Diabetes Mother    Hypertension Mother    Heart disease Mother    Heart attack Father    Cervical cancer Sister    Diabetes Sister    Hypertension Brother    Diabetes Brother    Pancreatic cancer Brother    Diabetes Brother    Breast cancer Sister    Diabetes Sister    Diabetes Sister    Diabetes Sister    Diabetes Sister    Diabetes Son    Colon cancer Neg Hx    Colon polyps  Neg Hx    Esophageal cancer Neg Hx    Stomach cancer Neg Hx    Scheduled Meds:  allopurinol  300 mg Oral Daily   empagliflozin  10 mg Oral QAC breakfast   midodrine  10 mg Oral BID WC   mirtazapine  15 mg Oral QHS   pantoprazole (PROTONIX) IV  40 mg Intravenous Q12H   Ensure Max Protein  11 oz Oral QHS   rosuvastatin  5 mg Oral QPM   Tafamidis  1 capsule Oral Daily   tamsulosin  0.4 mg Oral QHS   torsemide  40 mg Oral Daily   Continuous Infusions:  sodium chloride 10 mL/hr at 07/19/23 0322   cefTRIAXone (ROCEPHIN)  IV Stopped (07/18/23 1756)   PRN Meds:.colchicine, zolpidem Allergies  Allergen Reactions   Lipitor [Atorvastatin] Other (See Comments)    Body aches   Penicillins Swelling    **Tolerates cephalosporins Swollen face   Penicillin G     Other reaction(s): Unknown   Review of Systems  Constitutional:  Positive for activity change, appetite change and fatigue.  Gastrointestinal:  Positive for abdominal distention. Negative for abdominal pain and anal bleeding.  Neurological:  Positive for weakness.    Physical Exam Vitals and nursing note reviewed.  Constitutional:      General: He is not in acute distress.    Appearance: He is cachectic. He is ill-appearing.  Cardiovascular:     Rate and Rhythm: Normal rate.  Pulmonary:     Effort: No tachypnea, accessory muscle usage or respiratory distress.  Abdominal:     General: There is distension.  Neurological:     Mental Status: He is alert and oriented to person, place, and time.     Vital Signs: BP 91/72   Pulse 86   Temp 98 F (36.7 C) (Oral)   Resp 18   Ht 6\' 2"  (1.88 m)   Wt 74.6 kg   SpO2 100%   BMI 21.12 kg/m  Pain Scale: 0-10   Pain Score: 0-No pain   SpO2: SpO2: 100 % O2 Device:SpO2: 100 % O2 Flow Rate: .   IO: Intake/output summary:  Intake/Output Summary (Last 24 hours) at 07/19/2023 0915 Last data filed at 07/19/2023 4782 Gross per 24 hour  Intake 1226.8 ml  Output 800 ml  Net  426.8 ml    LBM: Last BM Date : 07/19/23 Baseline Weight: Weight: 74.6 kg Most recent weight: Weight: 74.6 kg     Palliative Assessment/Data:    Time Total: 55 min  Greater than 50%  of this time was spent counseling and coordinating care related to the above assessment and plan.  Signed by: Yong Channel, NP Palliative Medicine Team Pager # [phone] (M-F 8a-5p) Team Phone # [phone] (Nights/Weekends)

## 2023-07-19 NOTE — Consult Note (Addendum)
NAME:  Frank Berg, MRN:  [id number], DOB:  Dec 23, 1955, LOS: 1 ADMISSION DATE:  07/18/2023, CONSULTATION DATE: 9/25 REFERRING MD: Dr. Renford Dills, CHIEF COMPLAINT: Hypotension  History of Present Illness:  67 year old male with past medical history as below, which is significant for cardiac amyloid, ischemic or myopathy, A-fib on Xarelto, chronic hypotension on midodrine, end-stage hepatic cirrhosis with introduction palliative care, diabetes, and sleep apnea on CPAP, as well as CKD stage IIIb.  He was recently admitted after serosanguineous peritoneal fluid and also had symptoms of GI bleeding.  He was treated with blood transfusion and typical GI bleed treatment.  GI was consulted and no procedures recommended.  Improved with conservative management.  At the time of discharge she was noted to have a left rectus abdominis intramuscular hematoma likely following paracentesis.  This was stable and he was discharged.  Xarelto was restarted at discharge.  The patient presented again to [facility] emergency department on 9/24 with complaints of lightheadedness.  Workup in the ED was consistent for hemoglobin decreased to 7.1 and he was treated with 1 unit of PRBC.  CT of the abdomen was concerning for significant increase in ascites.  On 9/25 he was taken to interventional radiology for paracentesis which removed 400 mL of frank blood.  During the procedure he became hypotensive and PCCM was asked to evaluate for ICU management.  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   Significant  Hospital Events: Including procedures, antibiotic start and stop dates in addition to other pertinent events   9/20 - 9/23 admit for acute GI bleed, improved with conservative management 9/24 admitted for symptomatic anemia in the setting of acute blood loss. 9/25 paracentesis for 400 mL of frank blood with hypotension.  PCCM consult  Interim History / Subjective:    Objective   Blood pressure (!) 89/68, pulse 86, temperature 98 F (36.7 C), temperature source Oral, resp. rate 18, height 6\' 2"  (1.88 m), weight 74.6 kg, SpO2 100%.        Intake/Output Summary (Last 24 hours) at 07/19/2023 1155 Last data filed at 07/19/2023 1324 Gross per 24 hour  Intake 1226.8 ml  Output 800 ml  Net 426.8 ml   Filed Weights   07/18/23 1044  Weight: 74.6 kg    Examination: General: Adult male in no acute distress resting comfortably HENT: Normocephalic, atraumatic, PERRL Lungs: Clear bilateral breath sounds Cardiovascular: Regular rate and rhythm, no murmurs rubs or gallops Abdomen: Protuberant, soft, nontender Extremities: No acute deformity or range of motion limitation.  No edema Neuro: Alert, oriented, nonfocal   Resolved Hospital Problem list     Assessment & Plan:   Hypotension secondary to acute blood loss anemia hemoperitoneum and fluid shifts from paracentesis.  Systolic blood pressure dropped into the 70s during the procedure. 400 mL frank blood removed. Suspect his known rectus abdominus hematoma bled internally after restarting his AC, but true cause uncertain.  however, upon our evaluation the patient's systolic blood pressures had improved to his baseline of the 90s and he was asymptomatic. -Trend CBCs -Transfuse for hemoglobin less than 7 -Hold Xarelto, may no longer be a candidate for systemic anticoagulation -Last took Xarelto on 9/23, probably not much  benefit to reversing with Kcentra or Andexxa -Agree vitamin K -Continue midodrine as you are -IR not recommending any  further workup at this time -Stable for progressive care admission -PCCM will follow peripherally and fade away if patient remains stable  Other issues per primary: -Chronic HFrEF -CKD -DM -OSA on CPAP -CAD -End-stage liver disease    Best Practice (right click and "Reselect all SmartList Selections" daily)    Per primary  Labs   CBC: Recent Labs  Lab 07/14/23 1621 07/15/23 0150 07/17/23 0243 07/18/23 1250 07/18/23 1841 07/19/23 0314 07/19/23 1107  WBC 4.5  --  4.6 7.6  --   --   --   NEUTROABS  --   --  3.7 6.9  --   --   --   HGB 9.5*   < > 10.5* 7.1* 8.4* 9.2* 8.9*  HCT 29.3*   < > 32.2* 22.2* 26.5* 26.9* 26.0*  MCV 89.9  --  88.0 91.0  --   --   --   PLT 339  --  321 242  --   --   --    < > = values in this interval not displayed.    Basic Metabolic Panel: Recent Labs  Lab 07/14/23 1621 07/15/23 0150 07/17/23 0243 07/18/23 1250  NA 128* 128* 128* 125*  K 4.3 4.0 5.3* 5.0  CL 97* 96* 101 97*  CO2 24 20* 23 19*  GLUCOSE 109* 127* 99 99  BUN 48* 43* 33* 42*  CREATININE 2.23* 2.04* 1.89* 2.10*  CALCIUM 8.2* 8.1* 8.1* 7.7*  MG  --   --  2.3  --    GFR: Estimated Creatinine Clearance: 35 mL/min (A) (by C-G formula based on SCr of 2.1 mg/dL (H)). Recent Labs  Lab 07/14/23 1621 07/14/23 1702 07/17/23 0243 07/18/23 1250  WBC 4.5  --  4.6 7.6  LATICACIDVEN  --  1.5  --   --     Liver Function Tests: Recent Labs  Lab 07/14/23 1621 07/15/23 0150 07/17/23 0243 07/18/23 1250  AST 43* 37 29 31  ALT 11 10 8 7   ALKPHOS 121 89 89 71  BILITOT 1.5* 1.4* 1.2 1.4*  PROT 7.5 6.4* 6.1* 5.4*  ALBUMIN 2.7* 2.7* 2.3* 2.0*   Recent Labs  Lab 07/18/23 1250  LIPASE 96*   Recent Labs  Lab 07/15/23 0150  AMMONIA 21    ABG    Component Value Date/Time   PHART 7.393 03/18/2021 0803   PCO2ART 38.2 03/18/2021 0803   PO2ART 153 (H) 03/18/2021 0803   HCO3 24.7 03/18/2021 0813   TCO2 26 03/18/2021 0813   ACIDBASEDEF 1.0 03/18/2021 0813   O2SAT 60.0  03/18/2021 0813     Coagulation Profile: Recent Labs  Lab 07/14/23 1621 07/15/23 0150 07/18/23 1250  INR 2.1* 1.9* 3.9*    Cardiac Enzymes: No results for input(s): "CKTOTAL", "CKMB", "CKMBINDEX", "TROPONINI" in the last 168 hours.  HbA1C: Hgb A1c MFr Bld  Date/Time Value Ref Range Status  07/15/2023 01:50 AM 5.8 (H) 4.8 - 5.6 % Final    Comment:    (NOTE) Pre diabetes:          5.7%-6.4%  Diabetes:              >6.4%  Glycemic control for   <7.0% adults with diabetes   02/12/2021 07:57 AM 6.9 (H) 4.8 - 5.6 % Final    Comment:    (NOTE)         Prediabetes: 5.7 -  6.4         Diabetes: >6.4         Glycemic control for adults with diabetes: <7.0     CBG: Recent Labs  Lab 07/17/23 0027 07/17/23 0421 07/17/23 0618 07/17/23 0733 07/17/23 1155  GLUCAP 89 96 110* 94 86    Review of Systems:   Bolds are positive  Constitutional: weight loss, gain, night sweats, Fevers, chills, fatigue .  HEENT: headaches, Sore throat, sneezing, nasal congestion, post nasal drip, Difficulty swallowing, Tooth/dental problems, visual complaints visual changes, ear ache CV:  chest pain, radiates:,Orthopnea, PND, swelling in lower extremities, dizziness, palpitations, syncope.  GI  heartburn, indigestion, abdominal pain, nausea, vomiting, diarrhea, change in bowel habits, loss of appetite, bloody stools.  Resp: cough, productive: , hemoptysis, dyspnea, chest pain, pleuritic.  Skin: rash or itching or icterus GU: dysuria, change in color of urine, urgency or frequency. flank pain, hematuria  MS: joint pain or swelling. decreased range of motion  Psych: change in mood or affect. depression or anxiety.  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   Surgical History:   Past Surgical History:  Procedure Laterality Date   ABLATION     AORTIC VALVE REPLACEMENT  2011   CERVICAL DISC SURGERY     spinal stenosis   COLONOSCOPY     ESOPHAGOGASTRODUODENOSCOPY     IR PARACENTESIS  05/12/2023   IR PARACENTESIS  06/27/2023   IR PARACENTESIS  07/14/2023   IR PARACENTESIS  07/17/2023   IR PARACENTESIS  07/19/2023   RIGHT/LEFT HEART CATH AND CORONARY/GRAFT ANGIOGRAPHY N/A 03/18/2021   Procedure: RIGHT/LEFT HEART CATH AND CORONARY/GRAFT ANGIOGRAPHY;  Surgeon: Corky Crafts, MD;  Location: [facility] INVASIVE [facility];  Service: Cardiovascular;  Laterality: N/A;   TONSILLECTOMY       Social History:   reports that he quit smoking about 41 years ago. His smoking use included cigarettes. He started smoking about 51 years ago. He has a 2.5 pack-year smoking history. He has never used smokeless tobacco. He reports current alcohol use. He reports that he does not use drugs.   Family History:  His family history includes Breast cancer in his sister; Cervical cancer in his sister; Congenital heart disease in his mother; Diabetes in his brother, brother, mother, sister, sister, sister, sister, sister, and son; Heart attack in his father; Heart disease in his mother; Hypertension in his brother and mother; Pancreatic cancer in his brother. There is no history of Colon cancer, Colon polyps, Esophageal cancer, or Stomach cancer.   Allergies Allergies  Allergen Reactions   Lipitor [Atorvastatin] Other (See Comments)    Body aches   Penicillins Swelling    **Tolerates cephalosporins Swollen face   Penicillin G     Other reaction(s): Unknown     Home Medications  Prior to Admission medications   Medication Sig Start Date  End Date Taking? Authorizing Provider  allopurinol (ZYLOPRIM) 300 MG tablet Take 300 mg by mouth daily. 11/23/19  Yes [provider]  colchicine 0.6 MG tablet Take 0.6 mg by mouth as needed. 02/14/23  Yes [provider]  empagliflozin (JARDIANCE) 10 MG TABS tablet  take 1 tablet by mouth daily before breakfast 02/14/23  Yes Bensimhon, Bevelyn Buckles, MD  Ensure Max Protein (ENSURE MAX PROTEIN) LIQD Take 330 mLs (11 oz total) by mouth at bedtime. 12/20/21  Yes Sheikh, Omair Latif, DO  ferrous sulfate 325 (65 FE) MG tablet Take 1 tablet (325 mg total) by mouth daily with breakfast. 03/09/23  Yes [name], MD  loratadine (CLARITIN) 10 MG tablet Take 10 mg by mouth daily as needed for allergies.   Yes [provider]  midodrine (PROAMATINE) 10 MG tablet Take 1 tablet (10 mg total) by mouth 2 (two) times daily with a meal. 05/04/23  Yes Bensimhon, Bevelyn Buckles, MD  mirtazapine (REMERON) 15 MG tablet Take 15 mg by mouth at bedtime.   Yes [provider]  Multiple Vitamins-Minerals (CERTAVITE/ANTIOXIDANTS) TABS Take 1 tablet by mouth daily. 12/21/21  Yes Sheikh, Omair Latif, DO  pantoprazole (PROTONIX) 40 MG tablet Take 1 tablet (40 mg total) by mouth 2 (two) times daily. 07/17/23 08/16/23 Yes Ghimire, Lyndel Safe, MD  Rivaroxaban (XARELTO) 15 MG TABS tablet Take 15 mg by mouth daily with supper.   Yes [provider]  rosuvastatin (CRESTOR) 5 MG tablet Take 5 mg by mouth every evening. 08/26/16  Yes [provider]  Tafamidis (VYNDAMAX) 61 MG CAPS Take 1 capsule by mouth daily. 09/08/22  Yes Bensimhon, Bevelyn Buckles, MD  tamsulosin (FLOMAX) 0.4 MG CAPS capsule Take 0.4 mg by mouth at bedtime. 06/09/23  Yes [provider]  torsemide (DEMADEX) 20 MG tablet Take 2 tablets (40 mg total) by mouth 2 (two) times daily for 2 days, THEN 2 tablets (40 mg total) daily. Patient taking differently:  2 tablets (40 mg total) daily. 06/02/23 07/18/23 Yes Bensimhon, Bevelyn Buckles, MD   Vitamin A 2400 MCG (8000 UT) CAPS Take 1 capsule by mouth daily. 06/29/23  Yes Bensimhon, Bevelyn Buckles, MD  vutrisiran sodium (AMVUTTRA) 25 MG/0.5ML syringe Inject 0.5 mLs (25 mg total) into the skin every 3 (three) months. 06/23/23  Yes Bensimhon, Bevelyn Buckles, MD  zolpidem (AMBIEN) 5 MG tablet Take 5 mg by mouth at bedtime as needed. 02/13/23  Yes [provider]  glucose blood (ONETOUCH VERIO) test strip TEST ONCE A DAY DX E11.29    [provider]  tadalafil, PAH, (ADCIRCA) 20 MG tablet Take 20 mg by mouth daily as needed (ED). Patient not taking: Reported on 07/18/2023 05/06/21   [provider]     Critical care time:      Joneen Roach, AGACNP-BC [facility]  See Amion for personal pager PCCM on call pager [phone] until 7pm. Please call Elink 7p-7a. [phone]  07/19/2023 12:07 PM

## 2023-07-19 NOTE — Progress Notes (Addendum)
PROGRESS NOTE  Frank Berg  WJX:[id number] DOB: [birth year] DOA: 07/18/2023 PCP: Adrian Prince, MD   Brief Narrative: Patient is a 67 year old male with history of end-stage liver cirrhosis , ischemic cardiomyopathy, cardiac amyloidosis, A-fib on chronic anticoagulation, type 2 diabetes, sleep apnea on CPAP, CKD stage IIIb, recurrent ascites needing paracentesis who was recently hospitalized for GI bleed workup  and was discharged on 07/17/2023 presented with lightheadedness, weakness, syncopal episode.  He was hypotensive on presentation.  Lab work showed sodium of 125, creatinine of 2.1, hemoglobin of 7.1, INR of 3.9.  CTA abdomen did not show any acute bleed but showed intramuscular hematoma of left rectus abdominis which was diagnosed on last admission.  Patient was given 2 units of PRBC.  Patient was taken to IR for paracentesis but procedure aborted due to fluid being frank blood, patient became hypotensive.  PCCM also consulted.  Gastroenterology following  Assessment & Plan:  Principal Problem:   Acute blood loss anemia (ABLA) Active Problems:   CAD (coronary artery disease)   PAF (paroxysmal atrial fibrillation) (HCC)   Benign essential hypertension   OSA on CPAP   Type 2 diabetes mellitus (HCC)   CKD (chronic kidney disease), stage II   Chronic combined systolic (congestive) and diastolic (congestive) heart failure (HCC)   Cirrhosis of liver with ascites (HCC)  Acute blood loss anemia due to GI bleed versus muscle hematoma: Patient admitted for GI bleed and was discharged on 07/17/2023.  On his last admission, GI following, EGD was not done, treated with Protonix infusion, octreotide and was discharged on PPI twice daily, Xarelto resumed.  He was noted to have intramuscular hematoma of rectus abdominis.  Patient presented with lightheadedness.  Found to have acute blood loss anemia with hemoglobin in the range of 7.  Transferred with 2 units of PRBC.  This morning hemoglobin stable  in the range of 9.  Xarelto on hold.  Patient mentioned seeing darker stool a day before admission.Found to have tenderness on the lower abdomen.  But CT abdomen/pelvis with contrast showed  interval decrease in size of the left periumbilical rectus abdominus hematoma in keeping with partial resolution.  Continue to monitor H&H.FOBT positive  Ascites:Patient was taken to IR for paracentesis but procedure aborted due to fluid being frank blood, patient became hypotensive.  Liver cirrhosis thought to be secondary to congestive hepatopathy.  Needs frequent patient has chronic hypotension and is on midodrine  Elevated INR: INR in the range of 3.  Given vitamin K, Kcentra  Suspicion for upper GI bleed: Never had EGD in the past.  GI following.  Continue PPI, ceftriaxone for suspicion for variceal bleed.  GI planning for EGD at some point.  Chronic, systolic/diastolic CHF: Follows with advanced heart failure team.  Last echo showed EF of 45 to 50%, grade 2 diastolic dysfunction.  On torsemide at home  Hyponatremia: Sodium of 125.  This is combination from liver cirrhosis, chronic CHF.  CKD stage IIIb: Creatinine is in the range of 2, which is his baseline.  Avoid nephrotoxins  Type 2 diabetes: Monitor blood sugar.  On Jardiance at home.  Also on sliding scale here.  OSA: On CPAP  Paroxysmal A-fib: Remains in A-fib with controlled rate.  Xarelto on hold  Coronary artery disease: Status post CABG.  No anginal symptoms.  Follows with cardiology as an outpatient.  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  Goals of care: Multiple comorbidities with very poor prognosis.  CODE STATUS DNR.  Palliative care consulted        DVT prophylaxis:Place TED hose Start: 07/18/23 1820     Code Status: Limited: Do not attempt resuscitation (DNR) -DNR-LIMITED -Do Not Intubate/DNI   Family Communication: Called wife Shawna Orleans for update, call not received  Patient  status:Inpatient  Patient is from :Home  Anticipated discharge to: Not sure  Estimated DC date: Not sure   Consultants: GI, PCCM, palliative care  Procedures:paracentesis  Antimicrobials:  Anti-infectives (From admission, onward)    Start     Dose/Rate Route Frequency Ordered Stop   07/18/23 1645  cefTRIAXone (ROCEPHIN) 1 g in sodium chloride 0.9 % 100 mL IVPB        1 g 200 mL/hr over 30 Minutes Intravenous Every 24 hours 07/18/23 1642         Subjective: Patient seen and examined at bedside in the emergency department.  During my evaluation, blood pressure was soft but stable.  Patient looks very deconditioned, weak.  Hemoglobin stable in the range of 9 this morning.  His abdomen was distended.  We discussed about doing paracentesis.  Denies any abdomen pain, nausea or vomiting or chest pain or shortness of breath.  Objective: Vitals:   07/19/23 0500 07/19/23 0530 07/19/23 0600 07/19/23 0630  BP: 98/68 (!) 83/65 92/73 95/73   Pulse: 88 79 85 81  Resp: (!) 21 13 18 18   Temp:      TempSrc:      SpO2: 100% 100% 100% 100%  Weight:      Height:        Intake/Output Summary (Last 24 hours) at 07/19/2023 0726 Last data filed at 07/19/2023 7829 Gross per 24 hour  Intake 1226.8 ml  Output 800 ml  Net 426.8 ml   Filed Weights   07/18/23 1044  Weight: 74.6 kg    Examination:  General exam: Sick looking, deconditioned, weak HEENT: PERRL Respiratory system: Diminished air sounds on the bases, no wheezes or crackles  Cardiovascular system: Irregularly irregular rhythm Gastrointestinal system: Abdomen is distended, soft and has some lower abdominal tenderness Central nervous system: Alert and oriented Extremities: No edema, no clubbing ,no cyanosis Skin: No rashes, no ulcers,no icterus     Data Reviewed: I have personally reviewed following labs and imaging studies  CBC: Recent Labs  Lab 07/14/23 1621 07/15/23 0150 07/16/23 0250 07/17/23 0243 07/18/23 1250  07/18/23 1841 07/19/23 0314  WBC 4.5  --   --  4.6 7.6  --   --   NEUTROABS  --   --   --  3.7 6.9  --   --   HGB 9.5*   < > 10.3* 10.5* 7.1* 8.4* 9.2*  HCT 29.3*   < > 30.8* 32.2* 22.2* 26.5* 26.9*  MCV 89.9  --   --  88.0 91.0  --   --   PLT 339  --   --  321 242  --   --    < > = values in this interval not displayed.   Basic Metabolic Panel: Recent Labs  Lab 07/14/23 1621 07/15/23 0150 07/17/23 0243 07/18/23 1250  NA 128* 128* 128* 125*  K 4.3 4.0 5.3* 5.0  CL 97* 96* 101 97*  CO2 24 20* 23 19*  GLUCOSE 109* 127* 99 99  BUN 48* 43* 33* 42*  CREATININE 2.23* 2.04* 1.89* 2.10*  CALCIUM 8.2* 8.1* 8.1* 7.7*  MG  --   --  2.3  --      No  results found for this or any previous visit (from the past 240 hour(s)).   Radiology Studies: CT ABDOMEN PELVIS W CONTRAST  Result Date: 07/18/2023 CLINICAL DATA:  Anemia, abdominal wall hematoma EXAM: CT ABDOMEN AND PELVIS WITH CONTRAST TECHNIQUE: Multidetector CT imaging of the abdomen and pelvis was performed using the standard protocol following bolus administration of intravenous contrast. RADIATION DOSE REDUCTION: This exam was performed according to the departmental dose-optimization program which includes automated exposure control, adjustment of the mA and/or kV according to patient size and/or use of iterative reconstruction technique. CONTRAST:  60mL OMNIPAQUE IOHEXOL 350 MG/ML SOLN COMPARISON:  07/14/2023 FINDINGS: Lower chest: Small right and trace left pleural effusions are present. Bibasilar pulmonary parenchymal scarring. Median sternotomy has been performed. No acute abnormality. Hepatobiliary: Cirrhotic changes better appreciated on MRI examination 07/15/2023. Known hypoenhancing lesion within the left hepatic lobe is not well appreciated on this examination. No gallstones, gallbladder wall thickening, or biliary dilatation. Pancreas: Unremarkable Spleen: Unremarkable Adrenals/Urinary Tract: The adrenal glands are unremarkable.  The kidneys are normal in size and position. Simple cortical cyst noted within the interpolar region of the right kidney for which no follow-up imaging is recommended. The kidneys are otherwise unremarkable. Foley catheter balloon seen within a decompressed bladder lumen. Stomach/Bowel: Large volume ascites, progressive since prior examination. Stomach, small bowel, and large bowel are unremarkable. No free intraperitoneal gas. Vascular/Lymphatic: Extensive aortoiliac atherosclerotic calcification. No aortic aneurysm. No pathologic adenopathy within the abdomen and pelvis. Reproductive: Moderate prostatic hypertrophy. Other: Intramuscular hematoma within the left periumbilical rectus abdominus musculature has decreased in size in keeping with partial resolution. 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 Active extravasation, while not completely excluded on this single phase exam, is considered less likely given the interval decrease in size of the rectus sheath hematoma. Musculoskeletal: No acute bone abnormality. No lytic or blastic bone lesion. Osseous structures are age-appropriate. IMPRESSION: 1. Interval decrease in size of the left periumbilical rectus abdominus hematoma in keeping with partial resolution. 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 Active extravasation, while not completely excluded on this single phase exam, is considered less likely given the interval decrease in size of the rectus sheath hematoma. This  could be further assessed with dedicated sonography if indicated. 2. Cirrhotic changes better appreciated on MRI examination 07/15/2023. Known hypoenhancing lesion within the left hepatic lobe is not well appreciated on this examination. 3. Large volume ascites, progressive since prior examination. 4. Small right and trace left pleural effusions. 5. Moderate prostatic hypertrophy. Aortic Atherosclerosis (ICD10-I70.0). Electronically Signed   By: Helyn Numbers M.D.   On: 07/18/2023 18:45   IR Paracentesis  Result Date: 07/17/2023 INDICATION: 67 year old male. Inpatient request. History of CHF with recurrent ascites. Request is for therapeutic paracentesis EXAM: ULTRASOUND GUIDED THERAPEUTIC PARACENTESIS MEDICATIONS: Lidocaine 1% 10 mL COMPLICATIONS: None immediate. PROCEDURE: Informed written consent was obtained from the patient after a discussion of the risks, benefits and alternatives to treatment. A timeout was performed prior to the initiation of the procedure. Initial ultrasound scanning demonstrates a small amount of ascites within the right lower abdominal quadrant. The right lower abdomen  was prepped and draped in the usual sterile fashion. 1% lidocaine was used for local anesthesia. Following this, a 19 gauge, 7-cm, Yueh catheter was introduced. An ultrasound image was saved for documentation purposes. The paracentesis was performed. The catheter was removed and a dressing was applied. The patient tolerated the procedure well without immediate post procedural complication. FINDINGS: A total of approximately 900 mL of serosanguineous fluid was removed. IMPRESSION: Successful ultrasound-guided therapeutic LEFT paracentesis yielding 900 mL of peritoneal fluid. Performed by Anders Grant NP Electronically Signed   By: Roanna Banning M.D.   On: 07/17/2023 14:43    Scheduled Meds:  allopurinol  300 mg Oral Daily   empagliflozin  10 mg Oral QAC breakfast   midodrine  10 mg Oral BID WC   mirtazapine   15 mg Oral QHS   pantoprazole (PROTONIX) IV  40 mg Intravenous Q12H   Ensure Max Protein  11 oz Oral QHS   rosuvastatin  5 mg Oral QPM   Tafamidis  1 capsule Oral Daily   tamsulosin  0.4 mg Oral QHS   torsemide  40 mg Oral Daily   Continuous Infusions:  sodium chloride 10 mL/hr at 07/19/23 0322   cefTRIAXone (ROCEPHIN)  IV Stopped (07/18/23 1756)     LOS: 1 day   Burnadette Pop, MD [facility] P9/25/2024, 7:26 AM

## 2023-07-19 NOTE — Procedures (Addendum)
  Successful US guided paracentesis from right lateral abdomen.  Yielded 400 liters of blood.   The procedure was stopped due to the fluid being frank blood and the patient developing significant hypotension = 74/57  I alerted the Medicine team and my IR MDs.  Chart and images reviewed by Dr. Grace Isaac.  INR = 3.9. Last dose of Xarelto was 07/17/23.  Recommend correction of the INR and continue to hold anticoagulation.  Recommend Vitamin K, KCentra, Andexxa, or DDAVP.   No CTA at this time and no urgent need for IR procedure at this time (patient has CKD with GFR 33)  Frank Low S [place] Noori PA-C 07/19/2023 11:01 AM

## 2023-07-19 NOTE — Progress Notes (Signed)
   07/19/23 1517  TOC Brief Assessment  Insurance and Status Reviewed Administrator Medicare HMO PPO)  Patient has primary care physician Yes Eldridge Dace, Donnie Coffin, MD    Provider Role Cardiology - Cardiology  Office Phone [phone]   Hillis Range, MD  Provider Role Electrophysiology - Cardiology  Adrian Prince, MD    Provider Role General - Endocrinology  Primary Office Phone [phone])  Home environment has been reviewed From home withv Wife  Prior level of function: Somewhat independent  Prior/Current Home Services No current home services  Social Determinants of Health Reivew SDOH reviewed no interventions necessary  Readmission risk has been reviewed Yes (22%  Recent readmission)  Transition of care needs transition of care needs identified, TOC will continue to follow   TOC will follow for needs

## 2023-07-19 NOTE — ED Notes (Signed)
ED TO INPATIENT HANDOFF REPORT  ED Nurse Name and Phone #: [phone]  S Name/Age/Gender Frank Berg 67 y.o. male Room/Bed: 002C/002C  Code Status   Code Status: Limited: Do not attempt resuscitation (DNR) -DNR-LIMITED -Do Not Intubate/DNI   Home/SNF/Other Home Patient oriented to: self, place, time, and situation Is this baseline? Yes   Triage Complete: Triage complete  Chief Complaint Acute blood loss anemia (ABLA) [D62]  Triage Note Patient presents via EMS from home. Last paracentesis yesterday. Patient began feeling weak last night and had a pre-syncopal episode approximately 5 hours prior to arrival. Per EMS, patient feels okay when lying flat but feels dizzy and light-headed when he stands. Patient is AAOx4. Patient has a pre-existing foley catheter. Patient reports that he has a history of hypotension and took a dose of midodrine this morning (takes twice daily). Patient reports that he has had intermittent generalized abdominal pain since last night.    Allergies Allergies  Allergen Reactions   Lipitor [Atorvastatin] Other (See Comments)    Body aches   Penicillins Swelling    **Tolerates cephalosporins Swollen face   Penicillin G     Other reaction(s): Unknown    Level of Care/Admitting Diagnosis ED Disposition     ED Disposition  Admit   Condition  --   Comment  Hospital Area: [facility] [[id number]]  Level of Care: Progressive [102]  Admit to Progressive based on following criteria: GI, ENDOCRINE disease patients with GI bleeding, acute liver failure or pancreatitis, stable with diabetic ketoacidosis or thyrotoxicosis (hypothyroid) state.  May admit patient to [name] or [facility] if equivalent level of care is available:: No  Covid Evaluation: Asymptomatic - no recent exposure (last 10 days) testing not required  Diagnosis: Acute blood loss anemia Frank Berg) [[id number]]  Admitting Physician: Jacques Navy [[id number]]  Attending  Physician: Jacques Navy [[id number]]  Certification:: I certify this patient will need inpatient services for at least 2 midnights  Expected Medical Readiness: 07/21/2023          B Medical/Surgery History 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   HLD (hyperlipidemia)    Hx of adenomatous polyp of colon 12/03/2020   Hypertension    Indwelling urinary catheter present 04/2023   OSA on CPAP    Sleep apnea    Past Surgical History:  Procedure Laterality Date   ABLATION     AORTIC VALVE REPLACEMENT  2011   CERVICAL DISC SURGERY     spinal stenosis   COLONOSCOPY     ESOPHAGOGASTRODUODENOSCOPY     IR PARACENTESIS  05/12/2023   IR PARACENTESIS  06/27/2023   IR PARACENTESIS  07/14/2023   IR PARACENTESIS  07/17/2023   IR PARACENTESIS  07/19/2023   RIGHT/LEFT HEART CATH AND CORONARY/GRAFT ANGIOGRAPHY N/A 03/18/2021   Procedure: RIGHT/LEFT HEART CATH AND CORONARY/GRAFT ANGIOGRAPHY;  Surgeon: Corky Crafts, MD;  Location: [facility];  Service: Cardiovascular;  Laterality: N/A;   TONSILLECTOMY       A IV Location/Drains/Wounds Patient Lines/Drains/Airways Status     Active Line/Drains/Airways     Name Placement date Placement time Site Days   Peripheral IV 07/18/23 20 G Right Antecubital 07/18/23  1126  Antecubital  1   Peripheral IV 07/18/23 20 G Anterior;Distal;Left Forearm 07/18/23  1249  Forearm  1   Urethral Catheter Anne Shutter, RN Latex 18 Fr. 07/16/23  1450  Latex  3            Intake/Output Last 24 hours  Intake/Output Summary (Last 24 hours) at 07/19/2023 1238 Last data filed at 07/19/2023 8657 Gross per 24 hour  Intake 1226.8 ml  Output 800 ml  Net 426.8 ml     Labs/Imaging Results for orders placed or performed during the hospital encounter of 07/18/23 (from the past 48 hour(s))  Type and screen [facility]     Status: None   Collection Time: 07/18/23 11:23 AM  Result Value Ref Range   ABO/RH(D) B POS    Antibody Screen NEG    Sample Expiration 07/21/2023,2359    Unit Number [id number]    Blood Component Type RED CELLS,LR    Unit division 00    Status of Unit ISSUED,FINAL    Transfusion Status OK TO TRANSFUSE    Crossmatch Result Compatible    Unit Number [id number]    Blood Component Type RED CELLS,LR    Unit division 00    Status of Unit ISSUED,FINAL    Transfusion Status OK TO TRANSFUSE    Crossmatch Result      Compatible Performed at [facility], [address], [place], [place] [address]   Brain natriuretic peptide     Status: Abnormal   Collection Time: 07/18/23 11:23 AM  Result Value Ref Range   B Natriuretic Peptide 484.6 (H) 0.0 - 100.0 pg/mL    Comment: Performed at [facility], [address], [place], [place] [address]  CBC with Differential/Platelet     Status: Abnormal   Collection Time: 07/18/23 12:50 PM  Result Value Ref Range   WBC 7.6 4.0 - 10.5 K/uL   RBC 2.44 (L) 4.22 - 5.81 MIL/uL   Hemoglobin 7.1 (L) 13.0 - 17.0 g/dL    Comment: RESULTS VERIFIED VIA RECOLLECT   HCT 22.2 (L) 39.0 - 52.0 %   MCV 91.0 80.0 - 100.0 fL   MCH 29.1 26.0 - 34.0 pg   MCHC 32.0 30.0 - 36.0 g/dL   RDW 56.3 (H) 87.5 - 64.3 %   Platelets 242 150 - 400 K/uL   nRBC 0.0 0.0 - 0.2 %   Neutrophils Relative % 90 %   Neutro Abs 6.9 1.7 - 7.7 K/uL   Lymphocytes Relative 4 %   Lymphs Abs 0.3 (L) 0.7 - 4.0 K/uL   Monocytes Relative 5 %   Monocytes Absolute 0.4 0.1 - 1.0 K/uL   Eosinophils Relative 0 %   Eosinophils Absolute 0.0 0.0 - 0.5 K/uL   Basophils Relative 0 %   Basophils Absolute 0.0 0.0 - 0.1 K/uL   Immature Granulocytes 1 %   Abs Immature Granulocytes 0.06 0.00 - 0.07 K/uL     Comment: Performed at [facility], [address] [place], [place] [address]  Comprehensive metabolic panel     Status: Abnormal   Collection Time: 07/18/23 12:50 PM  Result Value Ref Range   Sodium 125 (L) 135 - 145 mmol/L   Potassium 5.0 3.5 - 5.1 mmol/L   Chloride 97 (L) 98 - 111 mmol/L   CO2 19 (L) 22 - 32 mmol/L   Glucose, Bld 99 70 - 99 mg/dL    Comment: Glucose reference range applies only to samples taken after  fasting for at least 8 hours.   BUN 42 (H) 8 - 23 mg/dL   Creatinine, Ser 9.56 (H) 0.61 - 1.24 mg/dL   Calcium 7.7 (L) 8.9 - 10.3 mg/dL   Total Protein 5.4 (L) 6.5 - 8.1 g/dL   Albumin 2.0 (L) 3.5 - 5.0 g/dL   AST 31 15 - 41 U/L   ALT 7 0 - 44 U/L   Alkaline Phosphatase 71 38 - 126 U/L   Total Bilirubin 1.4 (H) 0.3 - 1.2 mg/dL   GFR, Estimated 33 (L) >60 mL/min    Comment: (NOTE) Calculated using the CKD-EPI Creatinine Equation (2021)    Anion gap 9 5 - 15    Comment: Performed at [facility], [address], [place], [place] [address]  Lipase, blood     Status: Abnormal   Collection Time: 07/18/23 12:50 PM  Result Value Ref Range   Lipase 96 (H) 11 - 51 U/L    Comment: Performed at [facility], [address] [place], [place] [address]  Protime-INR     Status: Abnormal   Collection Time: 07/18/23 12:50 PM  Result Value Ref Range   Prothrombin Time 38.5 (H) 11.4 - 15.2 seconds   INR 3.9 (H) 0.8 - 1.2    Comment: (NOTE) INR goal varies based on device and disease states. Performed at [facility], [address] [place], [place] [address]   Prepare RBC (crossmatch)     Status: None   Collection Time: 07/18/23  2:18 PM  Result Value Ref Range   Order Confirmation      ORDER PROCESSED BY BLOOD BANK Performed at [facility], [address] [place], [place] [address]   POC occult blood, ED Provider will collect     Status: Abnormal   Collection Time: 07/18/23  5:19 PM  Result Value Ref Range   Fecal Occult Bld POSITIVE  (A) NEGATIVE  Prepare RBC (crossmatch)     Status: None   Collection Time: 07/18/23  6:27 PM  Result Value Ref Range   Order Confirmation      ORDER PROCESSED BY BLOOD BANK Performed at [facility], [address] [place], [place] [address]   Hemoglobin and hematocrit, blood     Status: Abnormal   Collection Time: 07/18/23  6:41 PM  Result Value Ref Range   Hemoglobin 8.4 (L) 13.0 - 17.0 g/dL   HCT 10.2 (L) 72.5 - 36.6 %    Comment: Performed at [facility], [address], [place], [place] [address]  Hemoglobin and hematocrit, blood     Status: Abnormal   Collection Time: 07/19/23  3:14 AM  Result Value Ref Range   Hemoglobin 9.2 (L) 13.0 - 17.0 g/dL   HCT 74.2 (L) 59.5 - 63.8 %    Comment: Performed at [facility], [address], [place], [place] [address]  Hemoglobin and hematocrit, blood     Status: Abnormal   Collection Time: 07/19/23 11:07 AM  Result Value Ref Range   Hemoglobin 8.9 (L) 13.0 - 17.0 g/dL   HCT 32.9 (L) 51.8 - 84.1 %    Comment: Performed at [facility], [address] [place], [place] [address]   IR Paracentesis  Result Date: 07/19/2023 INDICATION: Heart failure, hepatic cirrhosis, and recurrent ascites. Request for therapeutic paracentesis. EXAM: ULTRASOUND GUIDED PARACENTESIS MEDICATIONS: 1% lidocaine 10 mL COMPLICATIONS: None immediate. PROCEDURE: Informed written consent was obtained from  the patient after a discussion of the risks, benefits and alternatives to treatment. A timeout was performed prior to the initiation of the procedure. Initial ultrasound scanning demonstrates a large amount of ascites within the right lower abdominal quadrant. The right lower abdomen was prepped and draped in the usual sterile fashion. 1% lidocaine was used for local anesthesia. Following this, a 19 gauge, 7-cm, Yueh catheter was introduced. An ultrasound image was saved for documentation purposes. The paracentesis was performed. The catheter was  removed and a dressing was applied. The patient tolerated the procedure well without immediate post procedural complication. FINDINGS: Only 400 mL of blood was removed. The procedure was stopped secondary to patient developing severe hypotension and concern for bleeding into the abdomen. IMPRESSION: Successful ultrasound-guided paracentesis yielding 400 mL of blood. Critical results were discussed with the medicine team at the time of procedure. Procedure performed by: Corrin Parker, PA-C Electronically Signed   By: Malachy Moan M.D.   On: 07/19/2023 11:42   CT ABDOMEN PELVIS W CONTRAST  Result Date: 07/18/2023 CLINICAL DATA:  Anemia, abdominal wall hematoma EXAM: CT ABDOMEN AND PELVIS WITH CONTRAST TECHNIQUE: Multidetector CT imaging of the abdomen and pelvis was performed using the standard protocol following bolus administration of intravenous contrast. RADIATION DOSE REDUCTION: This exam was performed according to the departmental dose-optimization program which includes automated exposure control, adjustment of the mA and/or kV according to patient size and/or use of iterative reconstruction technique. CONTRAST:  60mL OMNIPAQUE IOHEXOL 350 MG/ML SOLN COMPARISON:  07/14/2023 FINDINGS: Lower chest: Small right and trace left pleural effusions are present. Bibasilar pulmonary parenchymal scarring. Median sternotomy has been performed. No acute abnormality. Hepatobiliary: Cirrhotic changes better appreciated on MRI examination 07/15/2023. Known hypoenhancing lesion within the left hepatic lobe is not well appreciated on this examination. No gallstones, gallbladder wall thickening, or biliary dilatation. Pancreas: Unremarkable Spleen: Unremarkable Adrenals/Urinary Tract: The adrenal glands are unremarkable. The kidneys are normal in size and position. Simple cortical cyst noted within the interpolar region of the right kidney for which no follow-up imaging is recommended. The kidneys are otherwise  unremarkable. Foley catheter balloon seen within a decompressed bladder lumen. Stomach/Bowel: Large volume ascites, progressive since prior examination. Stomach, small bowel, and large bowel are unremarkable. No free intraperitoneal gas. Vascular/Lymphatic: Extensive aortoiliac atherosclerotic calcification. No aortic aneurysm. No pathologic adenopathy within the abdomen and pelvis. Reproductive: Moderate prostatic hypertrophy. Other: Intramuscular hematoma within the left periumbilical rectus abdominus musculature has decreased in size in keeping with partial resolution. 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 Active extravasation, while not completely excluded on this single phase exam, is considered less likely given the interval decrease in size of the rectus sheath hematoma. Musculoskeletal: No acute bone abnormality. No lytic or blastic bone lesion. Osseous structures are age-appropriate. IMPRESSION: 1. Interval decrease in size of the left periumbilical rectus abdominus hematoma in keeping with partial resolution. 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 Active extravasation, while not completely excluded on this single phase exam, is considered less likely given the interval decrease in size of the rectus sheath hematoma. This could be further assessed  with dedicated sonography if indicated. 2. Cirrhotic changes better appreciated on MRI examination 07/15/2023. Known hypoenhancing lesion within the left hepatic lobe is  not well appreciated on this examination. 3. Large volume ascites, progressive since prior examination. 4. Small right and trace left pleural effusions. 5. Moderate prostatic hypertrophy. Aortic Atherosclerosis (ICD10-I70.0). Electronically Signed   By: Helyn Numbers M.D.   On: 07/18/2023 18:45   IR Paracentesis  Result Date: 07/17/2023 INDICATION: 67 year old male. Inpatient request. History of CHF with recurrent ascites. Request is for therapeutic paracentesis EXAM: ULTRASOUND GUIDED THERAPEUTIC PARACENTESIS MEDICATIONS: Lidocaine 1% 10 mL COMPLICATIONS: None immediate. PROCEDURE: Informed written consent was obtained from the patient after a discussion of the risks, benefits and alternatives to treatment. A timeout was performed prior to the initiation of the procedure. Initial ultrasound scanning demonstrates a small amount of ascites within the right lower abdominal quadrant. The right lower abdomen was prepped and draped in the usual sterile fashion. 1% lidocaine was used for local anesthesia. Following this, a 19 gauge, 7-cm, Yueh catheter was introduced. An ultrasound image was saved for documentation purposes. The paracentesis was performed. The catheter was removed and a dressing was applied. The patient tolerated the procedure well without immediate post procedural complication. FINDINGS: A total of approximately 900 mL of serosanguineous fluid was removed. IMPRESSION: Successful ultrasound-guided therapeutic LEFT paracentesis yielding 900 mL of peritoneal fluid. Performed by Anders Grant NP Electronically Signed   By: Roanna Banning M.D.   On: 07/17/2023 14:43    Pending Labs Unresulted Labs (From admission, onward)     Start     Ordered   07/20/23 0500  CBC  Tomorrow morning,   R        07/19/23 0742   07/20/23 0500  Comprehensive metabolic panel  Tomorrow morning,   R        07/19/23 0742   07/19/23 1330  Protime-INR  Once-Timed,   TIMED        07/19/23 1210   07/18/23 1826   Hemoglobin and hematocrit, blood  Now then every 8 hours,   R (with TIMED occurrences)     Comments: Change first occurrence to after completion of 2nd unit PRBCs.    07/18/23 1829   07/18/23 1058  CBC with Differential  Once,   STAT        07/18/23 1058            Vitals/Pain Today's Vitals   07/19/23 0900 07/19/23 1019 07/19/23 1115 07/19/23 1200  BP: 91/72 (!) 89/68 92/63 (!) 104/57  Pulse: 86  (!) 104 97  Resp: 18  20 (!) 21  Temp:      TempSrc:      SpO2: 100%  100% 100%  Weight:      Height:      PainSc:        Isolation Precautions No active isolations  Medications Medications  pantoprazole (PROTONIX) injection 40 mg (40 mg Intravenous Given 07/19/23 0911)  cefTRIAXone (ROCEPHIN) 1 g in sodium chloride 0.9 % 100 mL IVPB (0 g Intravenous Stopped 07/18/23 1756)  allopurinol (ZYLOPRIM) tablet 300 mg (300 mg Oral Given 07/19/23 0910)  colchicine tablet 0.6 mg (has no administration in time range)  midodrine (PROAMATINE) tablet 10 mg (10 mg Oral Given 07/19/23 0800)  rosuvastatin (CRESTOR) tablet 5 mg (5 mg Oral Given 07/18/23 1805)  Tafamidis CAPS 61 mg (61 mg Oral Not Given 07/18/23 1730)  torsemide (DEMADEX) tablet 40 mg (40 mg Oral Not Given 07/19/23 1031)  mirtazapine (REMERON) tablet 15 mg (15 mg Oral Given 07/18/23 2345)  zolpidem (AMBIEN) tablet 5 mg (has no administration in time range)  empagliflozin (JARDIANCE) tablet 10 mg (10 mg Oral Given 07/19/23 0801)  tamsulosin (FLOMAX) capsule 0.4 mg (0.4 mg Oral Given 07/18/23 2345)  protein supplement (ENSURE MAX) liquid (11 oz Oral Given 07/18/23 2337)  0.9 %  sodium chloride infusion ( Intravenous New Bag/Given 07/19/23 0322)  phytonadione (VITAMIN K) 10 mg in dextrose 5 % 50 mL IVPB (10 mg Intravenous New Bag/Given 07/19/23 1230)  prothrombin complex conc human (KCENTRA) IVPB 3,678 Units (3,678 Units Intravenous New Bag/Given 07/19/23 1222)  sodium chloride 0.9 % bolus 500 mL (0 mLs Intravenous Stopped 07/18/23 1249)   acetaminophen (TYLENOL) tablet 650 mg (650 mg Oral Given 07/18/23 1341)  0.9 %  sodium chloride infusion (Manually program via Guardrails IV Fluids) (0 mLs Intravenous Stopped 07/18/23 1720)  iohexol (OMNIPAQUE) 350 MG/ML injection 60 mL (60 mLs Intravenous Contrast Given 07/18/23 1712)  0.9 %  sodium chloride infusion (Manually program via Guardrails IV Fluids) ( Intravenous New Bag/Given 07/19/23 0323)  furosemide (LASIX) injection 20 mg (20 mg Intravenous Given 07/18/23 2345)  lidocaine (XYLOCAINE) 1 % (with pres) injection 20 mL (10 mLs Intradermal Given 07/19/23 1023)    Mobility walks     Focused Assessments Hypotension, dizziness   R Recommendations: See Admitting Provider Note  Report given to:   Additional Notes: Patient BP 70 to 80 systolic and diastolic 50's to 87'F. Some times it will be in the low 90's systolically. He is on midodrine. Patient have ascites he went to get paracentesis and they could only take off 400 cc, because of his low blood pressure. It was reported the fluid was blood. Patient is bleeding, I had to give him some Kcentra for the bleeding. Patient PT 38.5 and INR 3.9 he is getting vitamin K IV  10mg  once. e is going need a PT/INR at 1330.Patient hemoglobin was 7.1 he receive 1 unit of blood on third shift. Patient hemoglobin now 8.9.

## 2023-07-19 NOTE — Progress Notes (Signed)
Heart Failure Navigator Progress Note  Assessed for Heart & Vascular TOC clinic readiness.  Patient does not meet criteria due to Advanced Heart Failure Team patient of Dr. Bensimhon.   Navigator will sign off at this time.   Lucilia Yanni, BSN, RN Heart Failure Nurse Navigator Secure Chat Only   

## 2023-07-19 NOTE — Plan of Care (Signed)

## 2023-07-19 NOTE — Progress Notes (Addendum)
Gastroenterology Inpatient Follow Up    Subjective: Has not passed any more dark stools today.  He went to IR to get a paracentesis with frank blood removed and hypotension during the procedure.  Denies any abdominal pain after the procedure.  Objective: Vital signs in last 24 hours: Temp:  [97.4 F (36.3 C)-98.5 F (36.9 C)] 98.5 F (36.9 C) (09/25 1319) Pulse Rate:  [75-104] 97 (09/25 1200) Resp:  [12-21] 14 (09/25 1300) BP: (76-123)/(38-74) 94/74 (09/25 1300) SpO2:  [99 %-100 %] 100 % (09/25 1200) Last BM Date : 07/19/23  Intake/Output from previous day: 09/24 0701 - 09/25 0700 In: 1226.8 [Blood:630; IV Piggyback:596.8] Out: 800 [Urine:800] Intake/Output this shift: Total I/O In: -  Out: 500 [Urine:500]  General appearance: alert and cooperative Resp: No increased work of breathing Cardio: Regular rate GI: Nontender, paracentesis site on the right side of abdomen has overlying gauze , distended  Lab Results: Recent Labs    07/17/23 0243 07/18/23 1250 07/18/23 1841 07/19/23 0314 07/19/23 1107  WBC 4.6 7.6  --   --   --   HGB 10.5* 7.1* 8.4* 9.2* 8.9*  HCT 32.2* 22.2* 26.5* 26.9* 26.0*  PLT 321 242  --   --   --    BMET Recent Labs    07/17/23 0243 07/18/23 1250  NA 128* 125*  K 5.3* 5.0  CL 101 97*  CO2 23 19*  GLUCOSE 99 99  BUN 33* 42*  CREATININE 1.89* 2.10*  CALCIUM 8.1* 7.7*   LFT Recent Labs    07/18/23 1250  PROT 5.4*  ALBUMIN 2.0*  AST 31  ALT 7  ALKPHOS 71  BILITOT 1.4*   PT/INR Recent Labs    07/18/23 1250  LABPROT 38.5*  INR 3.9*   Hepatitis Panel No results for input(s): "HEPBSAG", "HCVAB", "HEPAIGM", "HEPBIGM" in the last 72 hours. C-Diff No results for input(s): "CDIFFTOX" in the last 72 hours.  Studies/Results: IR Paracentesis  Result Date: 07/19/2023 INDICATION: Heart failure, hepatic cirrhosis, and recurrent ascites. Request for therapeutic paracentesis. EXAM: ULTRASOUND GUIDED PARACENTESIS MEDICATIONS: 1%  lidocaine 10 mL COMPLICATIONS: None immediate. PROCEDURE: Informed written consent was obtained from the patient after a discussion of the risks, benefits and alternatives to treatment. A timeout was performed prior to the initiation of the procedure. Initial ultrasound scanning demonstrates a large amount of ascites within the right lower abdominal quadrant. The right lower abdomen was prepped and draped in the usual sterile fashion. 1% lidocaine was used for local anesthesia. Following this, a 19 gauge, 7-cm, Yueh catheter was introduced. An ultrasound image was saved for documentation purposes. The paracentesis was performed. The catheter was removed and a dressing was applied. The patient tolerated the procedure well without immediate post procedural complication. FINDINGS: Only 400 mL of blood was removed. The procedure was stopped secondary to patient developing severe hypotension and concern for bleeding into the abdomen. IMPRESSION: Successful ultrasound-guided paracentesis yielding 400 mL of blood. Critical results were discussed with the medicine team at the time of procedure. Procedure performed by: Corrin Parker, PA-C Electronically Signed   By: Malachy Moan M.D.   On: 07/19/2023 11:42   CT ABDOMEN PELVIS W CONTRAST  Result Date: 07/18/2023 CLINICAL DATA:  Anemia, abdominal wall hematoma EXAM: CT ABDOMEN AND PELVIS WITH CONTRAST TECHNIQUE: Multidetector CT imaging of the abdomen and pelvis was performed using the standard protocol following bolus administration of intravenous contrast. RADIATION DOSE REDUCTION: This exam was performed according to the departmental dose-optimization program which  includes automated exposure control, adjustment of the mA and/or kV according to patient size and/or use of iterative reconstruction technique. CONTRAST:  60mL OMNIPAQUE IOHEXOL 350 MG/ML SOLN COMPARISON:  07/14/2023 FINDINGS: Lower chest: Small right and trace left pleural effusions are present. Bibasilar  pulmonary parenchymal scarring. Median sternotomy has been performed. No acute abnormality. Hepatobiliary: Cirrhotic changes better appreciated on MRI examination 07/15/2023. Known hypoenhancing lesion within the left hepatic lobe is not well appreciated on this examination. No gallstones, gallbladder wall thickening, or biliary dilatation. Pancreas: Unremarkable Spleen: Unremarkable Adrenals/Urinary Tract: The adrenal glands are unremarkable. The kidneys are normal in size and position. Simple cortical cyst noted within the interpolar region of the right kidney for which no follow-up imaging is recommended. The kidneys are otherwise unremarkable. Foley catheter balloon seen within a decompressed bladder lumen. Stomach/Bowel: Large volume ascites, progressive since prior examination. Stomach, small bowel, and large bowel are unremarkable. No free intraperitoneal gas. Vascular/Lymphatic: Extensive aortoiliac atherosclerotic calcification. No aortic aneurysm. No pathologic adenopathy within the abdomen and pelvis. Reproductive: Moderate prostatic hypertrophy. Other: Intramuscular hematoma within the left periumbilical rectus abdominus musculature has decreased in size in keeping with partial resolution. 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 Active extravasation, while not completely excluded on this single phase exam, is considered less likely given the interval decrease in size of the rectus sheath hematoma. Musculoskeletal: No acute bone abnormality. No lytic or blastic bone lesion. Osseous structures are age-appropriate. IMPRESSION: 1. Interval decrease in size of the left periumbilical rectus abdominus hematoma in keeping with partial resolution. 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 Active extravasation, while not completely excluded on this single phase exam, is considered less likely given the interval decrease in size of the rectus sheath hematoma. This could be further assessed with dedicated sonography if indicated. 2. Cirrhotic changes better appreciated on MRI examination 07/15/2023. Known hypoenhancing lesion within the left hepatic lobe is not well appreciated on this examination. 3. Large volume ascites, progressive since prior examination. 4. Small right and trace left pleural effusions. 5. Moderate prostatic hypertrophy. Aortic Atherosclerosis (ICD10-I70.0). Electronically Signed   By: Helyn Numbers M.D.   On: 07/18/2023 18:45    Medications: I have reviewed the patient's current medications. Scheduled:  allopurinol  300 mg Oral Daily   Chlorhexidine Gluconate Cloth  6 each Topical Daily   midodrine  10 mg Oral TID   mirtazapine  15 mg Oral QHS   pantoprazole (PROTONIX) IV  40 mg Intravenous Q12H   Ensure Max Protein  11 oz Oral QHS   rosuvastatin  5 mg Oral QPM   Tafamidis  1 capsule Oral Daily   tamsulosin  0.4 mg Oral QHS   torsemide  40 mg Oral Daily   Continuous:  sodium chloride 10 mL/hr at 07/19/23 0322   cefTRIAXone (ROCEPHIN)  IV Stopped (07/18/23 1756)   sodium chloride     UYQ:[id number], zolpidem  Assessment/Plan: 67 year old male with history of cardiac amyloidosis, atrial fibrillation on Xarelto, CAD s/p CABG, CKD, bicuspid aortic valve s/p AVR, HFrEF (EF 45-50%, grade 3 diastolic dysfunction), and 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  He was recently admitted for melena and was restarted on his Xarelto on 9/23.  Recent  CTA 9/20 showed a rectus abdominis muscle hematoma with active extravasation.  Repeat CT here shows that this hematoma has decreased in size slightly but that there is a pseudoaneurysm within the rectus sheath that may have been the source of his hematoma.  Patient went for paracentesis today that was ordered by radiology.  Unfortunately his paracentesis revealed frank blood coming out with about 400 mL of output before being stopped.  Due to concern for possible active bleeding in his rectus abdominis, vitamin K and Kcentra were ordered.  Will continue supportive care and trend his hemoglobin closely.  If he continues to demonstrate signs of bleeding, could consider angiogram with possible IR intervention.  Will also continue to monitor for any signs of GI bleed.  Low threshold to restart his diuretic therapy since paracentesis was not able to successfully remove significant amounts of fluid.  -Trend hemoglobin -IV vitamin K and Kcentra -Closely monitor for any signs of bleeding -Continue PPI and ceftriaxone for now -Low threshold to restart home diuretics if hypotension improves and kidney function looks okay -If hemoglobin drops or signs of active bleeding, consider CTA A/P versus IR angiogram to look for active extravasation into the hematoma -If signs of GI bleeding, could consider EGD   LOS: 1 day   Imogene Burn 07/19/2023, 2:52 PM

## 2023-07-20 DIAGNOSIS — D649 Anemia, unspecified: Secondary | ICD-10-CM | POA: Diagnosis not present

## 2023-07-20 DIAGNOSIS — D62 Acute posthemorrhagic anemia: Secondary | ICD-10-CM | POA: Diagnosis not present

## 2023-07-20 DIAGNOSIS — K7460 Unspecified cirrhosis of liver: Secondary | ICD-10-CM | POA: Diagnosis not present

## 2023-07-20 DIAGNOSIS — K661 Hemoperitoneum: Secondary | ICD-10-CM | POA: Diagnosis not present

## 2023-07-20 DIAGNOSIS — Z7189 Other specified counseling: Secondary | ICD-10-CM | POA: Diagnosis not present

## 2023-07-20 DIAGNOSIS — Z515 Encounter for palliative care: Secondary | ICD-10-CM | POA: Diagnosis not present

## 2023-07-20 DIAGNOSIS — I5042 Chronic combined systolic (congestive) and diastolic (congestive) heart failure: Secondary | ICD-10-CM | POA: Diagnosis not present

## 2023-07-20 LAB — RETICULOCYTES
Immature Retic Fract: 19.5 % — ABNORMAL HIGH (ref 2.3–15.9)
RBC.: 3.16 MIL/uL — ABNORMAL LOW (ref 4.22–5.81)
Retic Count, Absolute: 97 10*3/uL (ref 19.0–186.0)
Retic Ct Pct: 3.1 % (ref 0.4–3.1)

## 2023-07-20 LAB — CBC
HCT: 26.2 % — ABNORMAL LOW (ref 39.0–52.0)
Hemoglobin: 9.1 g/dL — ABNORMAL LOW (ref 13.0–17.0)
MCH: 29.5 pg (ref 26.0–34.0)
MCHC: 34.7 g/dL (ref 30.0–36.0)
MCV: 85.1 fL (ref 80.0–100.0)
Platelets: 260 10*3/uL (ref 150–400)
RBC: 3.08 MIL/uL — ABNORMAL LOW (ref 4.22–5.81)
RDW: 17.1 % — ABNORMAL HIGH (ref 11.5–15.5)
WBC: 7 10*3/uL (ref 4.0–10.5)
nRBC: 0 % (ref 0.0–0.2)

## 2023-07-20 LAB — COMPREHENSIVE METABOLIC PANEL
ALT: 9 U/L (ref 0–44)
AST: 31 U/L (ref 15–41)
Albumin: 2 g/dL — ABNORMAL LOW (ref 3.5–5.0)
Alkaline Phosphatase: 76 U/L (ref 38–126)
Anion gap: 12 (ref 5–15)
BUN: 49 mg/dL — ABNORMAL HIGH (ref 8–23)
CO2: 17 mmol/L — ABNORMAL LOW (ref 22–32)
Calcium: 8 mg/dL — ABNORMAL LOW (ref 8.9–10.3)
Chloride: 97 mmol/L — ABNORMAL LOW (ref 98–111)
Creatinine, Ser: 2.42 mg/dL — ABNORMAL HIGH (ref 0.61–1.24)
GFR, Estimated: 28 mL/min — ABNORMAL LOW (ref >60)
Glucose, Bld: 74 mg/dL (ref 70–99)
Potassium: 4.7 mmol/L (ref 3.5–5.1)
Sodium: 126 mmol/L — ABNORMAL LOW (ref 135–145)
Total Bilirubin: 1.8 mg/dL — ABNORMAL HIGH (ref 0.3–1.2)
Total Protein: 5.7 g/dL — ABNORMAL LOW (ref 6.5–8.1)

## 2023-07-20 LAB — BRAIN NATRIURETIC PEPTIDE: B Natriuretic Peptide: 527.3 pg/mL — ABNORMAL HIGH (ref 0.0–100.0)

## 2023-07-20 LAB — URINALYSIS, ROUTINE W REFLEX MICROSCOPIC
Bilirubin Urine: NEGATIVE
Glucose, UA: >=500 mg/dL — AB
Ketones, ur: NEGATIVE mg/dL
Nitrite: NEGATIVE
Protein, ur: NEGATIVE mg/dL
RBC / HPF: >50 RBC/hpf (ref 0–5)
Specific Gravity, Urine: 1.011 (ref 1.005–1.030)
WBC, UA: >50 WBC/hpf (ref 0–5)
pH: 5 (ref 5.0–8.0)

## 2023-07-20 LAB — FOLATE: Folate: 16.1 ng/mL (ref >5.9)

## 2023-07-20 LAB — PROCALCITONIN: Procalcitonin: 4.8 ng/mL

## 2023-07-20 LAB — TSH: TSH: 5.121 u[IU]/mL — ABNORMAL HIGH (ref 0.350–4.500)

## 2023-07-20 LAB — CORTISOL: Cortisol, Plasma: 14.5 ug/dL

## 2023-07-20 LAB — OSMOLALITY: Osmolality: 280 mOsm/kg (ref 275–295)

## 2023-07-20 LAB — MAGNESIUM: Magnesium: 2.1 mg/dL (ref 1.7–2.4)

## 2023-07-20 MED ORDER — SENNA 8.6 MG PO TABS
1.0000 | ORAL_TABLET | Freq: Every day | ORAL | Status: DC
  Administered 2023-07-20 – 2023-07-24 (×3): 8.6 mg via ORAL
  Filled 2023-07-20 (×5): qty 1

## 2023-07-20 MED ORDER — BACITRACIN ZINC 500 UNIT/GM EX OINT
TOPICAL_OINTMENT | CUTANEOUS | Status: DC | PRN
  Filled 2023-07-20: qty 28.4

## 2023-07-20 MED ORDER — ALBUMIN HUMAN 25 % IV SOLN
50.0000 g | Freq: Two times a day (BID) | INTRAVENOUS | Status: AC
  Administered 2023-07-20 – 2023-07-21 (×4): 50 g via INTRAVENOUS
  Filled 2023-07-20 (×4): qty 200

## 2023-07-20 MED ORDER — POLYETHYLENE GLYCOL 3350 17 G PO PACK
17.0000 g | PACK | Freq: Every day | ORAL | Status: DC
  Filled 2023-07-20 (×3): qty 1

## 2023-07-20 MED ORDER — TORSEMIDE 20 MG PO TABS: 40.0000 mg | ORAL_TABLET | Freq: Every day | ORAL | Status: DC

## 2023-07-20 NOTE — Evaluation (Signed)
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   HLD (hyperlipidemia)    Hx of adenomatous polyp of colon 12/03/2020   Hypertension    Indwelling urinary catheter present 04/2023   OSA on CPAP    Sleep apnea    Past Surgical History:  Past Surgical History:  Procedure Laterality Date   ABLATION     AORTIC VALVE REPLACEMENT  2011   CERVICAL DISC SURGERY     spinal stenosis   COLONOSCOPY     ESOPHAGOGASTRODUODENOSCOPY     IR PARACENTESIS  05/12/2023   IR PARACENTESIS  06/27/2023   IR PARACENTESIS  07/14/2023   IR PARACENTESIS  07/17/2023   IR PARACENTESIS  07/19/2023   RIGHT/LEFT HEART CATH AND CORONARY/GRAFT ANGIOGRAPHY N/A 03/18/2021   Procedure: RIGHT/LEFT HEART CATH AND CORONARY/GRAFT ANGIOGRAPHY;  Surgeon: Corky Crafts, MD;  Location: [facility];  Service: Cardiovascular;  Laterality: N/A;   TONSILLECTOMY     HPI:  Frank Berg is a 67 yo male presenting to ED 9/24 with lightheadedness. Admitted with acute blood loss anemia and intramuscular hematoma of rectus abdominus. Recently admitted 9/20-9/23 for probable UGI bleed. Seen OP SLP 12/2022 for voice issues suspected to be related to reflux. 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 Oral motor exam WFL. Observed pt with trials of thin liquids, purees, and solids with no overt s/s of dysphagia or aspiration. He did belch frequently throughout the evaluation, which may be indicative of an esophageal component. Provided education re: esophageal precautions. Recommend continuing current diet of regular textures with thin liquids. No further SLP f/u needed at this time. Will s/o SLP Visit Diagnosis: Dysphagia, unspecified (R13.10)    Aspiration Risk  Mild aspiration risk    Diet Recommendation Regular;Thin liquid    Liquid Administration via: Cup;Straw Medication Administration: Whole meds with liquid Supervision: Patient able to self feed Compensations: Slow rate;Small sips/bites Postural Changes: Seated upright at 90 degrees;Remain upright for at least 30 minutes after po intake    Other  Recommendations Recommended Consults: Consider esophageal assessment Oral Care Recommendations: Oral care BID    Recommendations for follow up therapy are one component of a multi-disciplinary discharge planning process, led by the attending physician.  Recommendations may be updated based on patient status, additional functional criteria and insurance authorization.  Follow up Recommendations No SLP follow up      Assistance Recommended at Discharge    Functional Status Assessment Patient has not had a recent decline in their functional status  Frequency and Duration            Prognosis Prognosis for improved  oropharyngeal function: Good Barriers to Reach Goals: Time post onset      Swallow Study   General HPI: Frank Berg is a 67 yo male presenting to ED 9/24 with lightheadedness. Admitted with acute blood loss anemia and intramuscular hematoma of rectus  abdominus. Recently admitted 9/20-9/23 for probable UGI bleed. Seen OP SLP 12/2022 for voice issues suspected to be related to reflux. 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, BPH, T2DM, sleep apnea on CPAP, CKD stage III, recurrent paracentesis for ascites Type of Study: Bedside Swallow Evaluation Previous Swallow Assessment: none in chart Diet Prior to this Study: Regular;Thin liquids (Level 0) Temperature Spikes Noted: No Respiratory Status: Room air History of Recent Intubation: No Behavior/Cognition: Alert;Cooperative;Pleasant mood Oral Cavity Assessment: Within Functional Limits Oral Care Completed by SLP: No Oral Cavity - Dentition: Missing dentition;Poor condition Vision: Functional for self-feeding Self-Feeding Abilities: Able to feed self Patient Positioning: Upright in bed Baseline Vocal Quality: Hoarse Volitional Cough: Strong Volitional Swallow: Able to elicit    Oral/Motor/Sensory Function Overall Oral Motor/Sensory Function: Within functional limits   Ice Chips Ice chips: Not tested   Thin Liquid Thin Liquid: Within functional limits Presentation: Straw;Self Fed    Nectar Thick Nectar Thick Liquid: Not tested   Honey Thick Honey Thick Liquid: Not tested   Puree Puree: Within functional limits Presentation: Spoon;Self Fed   Solid     Solid: Within functional limits Presentation: Self Fed      Gwynneth Aliment, M.A., CF-SLP Speech Language Pathology, Acute Rehabilitation Services  Secure Chat preferred [phone]  07/20/2023,3:08 PM

## 2023-07-20 NOTE — Progress Notes (Signed)
PROGRESS NOTE                                                                                                                                                                                                             Patient Demographics:    Frank Berg, is a 67 y.o. male, DOB - Sep 08, 1956, YQM:[id number]  Outpatient Primary MD for the patient is Adrian Prince, MD    LOS - 2  Admit date - 07/18/2023    Chief Complaint  Patient presents with   Hypotension   Dizziness       Brief Narrative (HPI from H&P)   67 year old male with history of end-stage liver cirrhosis , ischemic cardiomyopathy, cardiac amyloidosis, A-fib on chronic anticoagulation, type 2 diabetes, sleep apnea on CPAP, CKD stage IIIb, recurrent ascites needing paracentesis who was recently hospitalized for GI bleed workup  and was discharged on 07/17/2023 presented with lightheadedness, weakness, syncopal episode.  He was hypotensive on presentation.  Lab work showed sodium of 125, creatinine of 2.1, hemoglobin of 7.1, INR of 3.9.  CTA abdomen did not show any acute bleed but showed intramuscular hematoma of left rectus abdominis which was diagnosed on last admission.  He was seen by GI, PCCM for hypotension, anemia, worsening ascites in the setting of end-stage liver cirrhosis, hyponatremia, possible hepatorenal syndrome.  Transferred to my care on 07/20/2023.   Subjective:    Frank Berg today has, No headache, No chest pain, No abdominal pain - No Nausea, No new weakness tingling or numbness, no SOB   Assessment  & Plan :   Acute blood loss anemia due to intra-abdominal muscle hematoma: Patient admitted for GI bleed and was discharged on 07/17/2023.  On his last admission, GI following, EGD was not done, treated with Protonix infusion, octreotide and was discharged on PPI twice daily, Xarelto was resumed for A-fib.  He was noted to have intramuscular  hematoma of rectus abdominis at that time as well.    Patient presented with lightheadedness.  Found to have acute blood loss anemia with hemoglobin in the range of 7.  Transferred with 2 units of PRBC.  This morning hemoglobin stable in the range of 9.  Xarelto on hold.  Patient mentioned seeing darker stool a day before admission.was seen by GI team this admission again, Xarelto was held, he received vitamin  K and Kcentra for Xarelto reversal, on PPI.  Currently CBC has stabilized, on clear liquid diet with close GI follow-up, defer advancing the diet to GI, do not think he has ongoing active GI bleed.    End-stage liver cirrhosis likely cardiac cirrhosis versus NASH with recurrent ascites possible hepatorenal syndrome: He had 3 L of fluid removed on 07/17/2023, abdomen is distended with low blood pressure, have ordered midodrine and albumin, will defer to GI if he needs more therapeutic fluid removal on 07/20/2023, options are limited, appreciate input from GI, palliative care and PCCM.    Chronic, systolic/diastolic CHF: Follows with advanced heart failure team.  Last echo showed EF of 45 to 50%, grade 2 diastolic dysfunction, blood pressure too low for ACE ARB/beta-blocker, diuretics as tolerated by blood pressure.   Hyponatremia: Due to massive third spacing of fluid, diuretics as tolerated, paracentesis as tolerated, limited options, have requested nephrology to opine.  Continue to monitor.   CKD stage IIIb: Creatinine is in the range of 2, which is his baseline.  Avoid nephrotoxins   OSA: On CPAP nightly   Paroxysmal A-fib: Remains in A-fib with controlled rate.  Xarelto on hold due to #1 above.   Coronary artery disease: Status post CABG.  No anginal symptoms.  Follows with cardiology as an outpatient.  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   Goals of care: Multiple comorbidities with very poor prognosis.  CODE STATUS DNR.  Palliative care  consulted, currently DNR/DNI.   Type 2 diabetes: Monitor blood sugar.  On Jardiance at home.  Also on sliding scale here.  CBG (last 3)  Recent Labs    07/17/23 1155  GLUCAP 86   Lab Results  Component Value Date   HGBA1C 5.8 (H) 07/15/2023           Condition - Extremely Guarded  Family Communication  :  wife Shawna Orleans  [phone] in detail on 07/20/2023  Code Status :  DNR-DNI  Consults  :  GI, Renal, PCCM palliative care  PUD Prophylaxis :  PPI   Procedures  :      CT - 1. Interval decrease in size of the left periumbilical rectus abdominus hematoma in keeping with partial resolution. 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 Active extravasation, while not completely excluded on this single phase exam, is considered less likely given the interval decrease in size of the rectus sheath hematoma. This could be further assessed with dedicated sonography if indicated. 2. Cirrhotic changes better appreciated on MRI examination 07/15/2023. Known hypoenhancing lesion within the left hepatic lobe is not well appreciated on this examination. 3. Large volume ascites, progressive since prior examination. 4. Small right and trace left pleural effusions. 5. Moderate prostatic hypertrophy. Aortic Atherosclerosis        Disposition Plan  :    Status is: Inpatient  DVT Prophylaxis  :    Place TED hose Start: 07/18/23 1820   Lab Results  Component Value Date   PLT 242 07/18/2023    Diet :  Diet Order             Diet clear liquid Fluid consistency: Thin  Diet effective now  Inpatient Medications  Scheduled Meds:  allopurinol  300 mg Oral Daily   Chlorhexidine Gluconate Cloth  6 each Topical Daily    midodrine  10 mg Oral TID WC   mirtazapine  15 mg Oral QHS   pantoprazole (PROTONIX) IV  40 mg Intravenous Q12H   Ensure Max Protein  11 oz Oral QHS   rosuvastatin  5 mg Oral QPM   Tafamidis  1 capsule Oral Daily   tamsulosin  0.4 mg Oral QHS   [START ON 07/21/2023] torsemide  40 mg Oral Daily   Continuous Infusions:  sodium chloride 10 mL/hr at 07/19/23 0322   albumin human     cefTRIAXone (ROCEPHIN)  IV 1 g (07/19/23 1640)   PRN Meds:.colchicine, zolpidem  Antibiotics  :    Anti-infectives (From admission, onward)    Start     Dose/Rate Route Frequency Ordered Stop   07/18/23 1645  cefTRIAXone (ROCEPHIN) 1 g in sodium chloride 0.9 % 100 mL IVPB        1 g 200 mL/hr over 30 Minutes Intravenous Every 24 hours 07/18/23 1642           Objective:   Vitals:   07/20/23 0045 07/20/23 0200 07/20/23 0400 07/20/23 0800  BP: 92/71 (!) 86/67 90/66 (!) 83/64  Pulse: 83 80 81 81  Resp: 12 14 14 15   Temp:   97.8 F (36.6 C) 97.9 F (36.6 C)  TempSrc:   Axillary Oral  SpO2: 100% 99% 99% 100%  Weight:   77.1 kg   Height:        Wt Readings from Last 3 Encounters:  07/20/23 77.1 kg  07/17/23 74.6 kg  06/12/23 75.8 kg     Intake/Output Summary (Last 24 hours) at 07/20/2023 0846 Last data filed at 07/19/2023 2000 Gross per 24 hour  Intake --  Output 1000 ml  Net -1000 ml     Physical Exam  Awake Alert, No new F.N deficits, Normal affect [place].AT,PERRAL Supple Neck, No JVD,   Symmetrical Chest wall movement, Good air movement bilaterally, CTAB RRR,No Gallops,Rubs or new Murmurs,  +ve B.Sounds, Abd is distended ++ No Cyanosis, Clubbing or edema       Data Review:    Recent Labs  Lab 07/14/23 1621 07/15/23 0150 07/17/23 0243 07/18/23 1250 07/18/23 1841 07/19/23 0314 07/19/23 1107 07/19/23 1829  WBC 4.5  --  4.6 7.6  --   --   --   --   HGB 9.5*   < > 10.5* 7.1* 8.4* 9.2* 8.9* 9.7*  HCT 29.3*   < > 32.2* 22.2* 26.5* 26.9* 26.0* 29.3*  PLT 339  --  321 242   --   --   --   --   MCV 89.9  --  88.0 91.0  --   --   --   --   MCH 29.1  --  28.7 29.1  --   --   --   --   MCHC 32.4  --  32.6 32.0  --   --   --   --   RDW 18.7*  --  18.6* 18.2*  --   --   --   --   LYMPHSABS  --   --  0.5* 0.3*  --   --   --   --   MONOABS  --   --  0.3 0.4  --   --   --   --   EOSABS  --   --  0.1 0.0  --   --   --   --   BASOSABS  --   --  0.0 0.0  --   --   --   --    < > = values in this interval not displayed.    Recent Labs  Lab 07/14/23 1621 07/14/23 1702 07/15/23 0150 07/17/23 0243 07/18/23 1123 07/18/23 1250 07/19/23 1446  NA 128*  --  128* 128*  --  125*  --   K 4.3  --  4.0 5.3*  --  5.0  --   CL 97*  --  96* 101  --  97*  --   CO2 24  --  20* 23  --  19*  --   ANIONGAP 7  --  12 4*  --  9  --   GLUCOSE 109*  --  127* 99  --  99  --   BUN 48*  --  43* 33*  --  42*  --   CREATININE 2.23*  --  2.04* 1.89*  --  2.10*  --   AST 43*  --  37 29  --  31  --   ALT 11  --  10 8  --  7  --   ALKPHOS 121  --  89 89  --  71  --   BILITOT 1.5*  --  1.4* 1.2  --  1.4*  --   ALBUMIN 2.7*  --  2.7* 2.3*  --  2.0*  --   LATICACIDVEN  --  1.5  --   --   --   --   --   INR 2.1*  --  1.9*  --   --  3.9* 1.7*  HGBA1C  --   --  5.8*  --   --   --   --   AMMONIA  --   --  21  --   --   --   --   BNP  --   --   --   --  484.6*  --   --   MG  --   --   --  2.3  --   --   --   CALCIUM 8.2*  --  8.1* 8.1*  --  7.7*  --       Recent Labs  Lab 07/14/23 1621 07/14/23 1702 07/15/23 0150 07/17/23 0243 07/18/23 1123 07/18/23 1250 07/19/23 1446  LATICACIDVEN  --  1.5  --   --   --   --   --   INR 2.1*  --  1.9*  --   --  3.9* 1.7*  HGBA1C  --   --  5.8*  --   --   --   --   AMMONIA  --   --  21  --   --   --   --   BNP  --   --   --   --  484.6*  --   --   MG  --   --   --  2.3  --   --   --   CALCIUM 8.2*  --  8.1* 8.1*  --  7.7*  --     --------------------------------------------------------------------------------------------------------------- No  results found for: "CHOL", "HDL", "LDLCALC", "LDLDIRECT", "TRIG", "CHOLHDL"  Lab Results  Component Value Date   HGBA1C 5.8 (H) 07/15/2023    Radiology Reports IR Paracentesis  Result Date: 07/19/2023 INDICATION: Heart failure, hepatic cirrhosis,  and recurrent ascites. Request for therapeutic paracentesis. EXAM: ULTRASOUND GUIDED PARACENTESIS MEDICATIONS: 1% lidocaine 10 mL COMPLICATIONS: None immediate. PROCEDURE: Informed written consent was obtained from the patient after a discussion of the risks, benefits and alternatives to treatment. A timeout was performed prior to the initiation of the procedure. Initial ultrasound scanning demonstrates a large amount of ascites within the right lower abdominal quadrant. The right lower abdomen was prepped and draped in the usual sterile fashion. 1% lidocaine was used for local anesthesia. Following this, a 19 gauge, 7-cm, Yueh catheter was introduced. An ultrasound image was saved for documentation purposes. The paracentesis was performed. The catheter was removed and a dressing was applied. The patient tolerated the procedure well without immediate post procedural complication. FINDINGS: Only 400 mL of blood was removed. The procedure was stopped secondary to patient developing severe hypotension and concern for bleeding into the abdomen. IMPRESSION: Successful ultrasound-guided paracentesis yielding 400 mL of blood. Critical results were discussed with the medicine team at the time of procedure. Procedure performed by: Corrin Parker, PA-C Electronically Signed   By: Malachy Moan M.D.   On: 07/19/2023 11:42   CT ABDOMEN PELVIS W CONTRAST  Result Date: 07/18/2023 CLINICAL DATA:  Anemia, abdominal wall hematoma EXAM: CT ABDOMEN AND PELVIS WITH CONTRAST TECHNIQUE: Multidetector CT imaging of the abdomen and pelvis was performed using the standard protocol following bolus administration of intravenous contrast. RADIATION DOSE REDUCTION: This exam was  performed according to the departmental dose-optimization program which includes automated exposure control, adjustment of the mA and/or kV according to patient size and/or use of iterative reconstruction technique. CONTRAST:  60mL OMNIPAQUE IOHEXOL 350 MG/ML SOLN COMPARISON:  07/14/2023 FINDINGS: Lower chest: Small right and trace left pleural effusions are present. Bibasilar pulmonary parenchymal scarring. Median sternotomy has been performed. No acute abnormality. Hepatobiliary: Cirrhotic changes better appreciated on MRI examination 07/15/2023. Known hypoenhancing lesion within the left hepatic lobe is not well appreciated on this examination. No gallstones, gallbladder wall thickening, or biliary dilatation. Pancreas: Unremarkable Spleen: Unremarkable Adrenals/Urinary Tract: The adrenal glands are unremarkable. The kidneys are normal in size and position. Simple cortical cyst noted within the interpolar region of the right kidney for which no follow-up imaging is recommended. The kidneys are otherwise unremarkable. Foley catheter balloon seen within a decompressed bladder lumen. Stomach/Bowel: Large volume ascites, progressive since prior examination. Stomach, small bowel, and large bowel are unremarkable. No free intraperitoneal gas. Vascular/Lymphatic: Extensive aortoiliac atherosclerotic calcification. No aortic aneurysm. No pathologic adenopathy within the abdomen and pelvis. Reproductive: Moderate prostatic hypertrophy. Other: Intramuscular hematoma within the left periumbilical rectus abdominus musculature has decreased in size in keeping with partial resolution. 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 Active extravasation, while not completely excluded on this  single phase exam, is considered less likely given the interval decrease in size of the rectus sheath hematoma. Musculoskeletal: No acute bone abnormality. No lytic or blastic bone lesion. Osseous structures are age-appropriate. IMPRESSION: 1. Interval decrease in size of the left periumbilical rectus abdominus hematoma in keeping with partial resolution. 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 Active extravasation, while not completely  excluded on this single phase exam, is considered less likely given the interval decrease in size of the rectus sheath hematoma. This could be further assessed with dedicated sonography if indicated. 2. Cirrhotic changes better appreciated on MRI examination 07/15/2023. Known hypoenhancing lesion within the left hepatic lobe is not well appreciated on this examination. 3. Large volume ascites, progressive since prior examination. 4. Small right and trace left pleural effusions. 5. Moderate prostatic hypertrophy. Aortic Atherosclerosis (ICD10-I70.0). Electronically Signed   By: Helyn Numbers M.D.   On: 07/18/2023 18:45   IR Paracentesis  Result Date: 07/17/2023 INDICATION: 67 year old male. Inpatient request. History of CHF with recurrent ascites. Request is for therapeutic paracentesis EXAM: ULTRASOUND GUIDED THERAPEUTIC PARACENTESIS MEDICATIONS: Lidocaine 1% 10 mL COMPLICATIONS: None immediate. PROCEDURE: Informed written consent was obtained from the patient after a discussion of the risks, benefits and alternatives to treatment. A timeout was performed prior to the initiation of the procedure. Initial ultrasound scanning demonstrates a small amount of ascites within the right lower abdominal quadrant. The right lower abdomen was  prepped and draped in the usual sterile fashion. 1% lidocaine was used for local anesthesia. Following this, a 19 gauge, 7-cm, Yueh catheter was introduced. An ultrasound image was saved for documentation purposes. The paracentesis was performed. The catheter was removed and a dressing was applied. The patient tolerated the procedure well without immediate post procedural complication. FINDINGS: A total of approximately 900 mL of serosanguineous fluid was removed. IMPRESSION: Successful ultrasound-guided therapeutic LEFT paracentesis yielding 900 mL of peritoneal fluid. Performed by Anders Grant NP Electronically Signed   By: Roanna Banning M.D.   On: 07/17/2023 14:43      Signature  -   Susa Raring M.D on 07/20/2023 at 8:46 AM   -  To page go to [email or web address]

## 2023-07-20 NOTE — Progress Notes (Signed)
Palliative:  HPI: 66 y.o. 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 Hgb from 10.5 at d/c 9/23 to 7.1 9/24 and received 2U PRBCs.   I met again today with Mr. Mellis. No family/visitors present. He tells me quickly that he is irritable today. He tells me that he has not been offered to brush his teeth since he arrived at the hospital and he does not like sitting and talking with people coming to care for him with bad breath. He expresses concern for penile pain that is not new and he uses bacitracin ointment PRN that he reports was prescribed by his urologist. RN brings in toothbrush and toothpaste and I ordered bacitracin ointment from pharmacy. I offered him washcloth to wash his face. Mr. Dallis expresses appreciation for the care and listening to him. I acknowledged that it is difficult to be in the hospital and even more difficult to deal with the severity of health issues he has. I reviewed with him that sometimes it is the small things that help him maintain his dignity and some control over his life - like brushing his teeth. Mr. Rutland agrees. He denies pain other than penile pain. He has no other discomforts. He has had no visibile bleeding or melena - no bowel movement in 3 days and he typically goes daily. He shares that he did become very constipated last admission and agrees to medication to assist with constipation - will order senokot.   I discussed with Mr. Gange the severity of his health issues with heart, liver, and kidney failure. He understands how complicated it is to try to manage his health and how easily his health can  decline. He tells me that he continues to hang in there and is still hopeful we can continue to fix what we can knowing that these issues will not go away. We discussed that the best we can do is trying to put small Band-Aids on bigger issues knowing these are temporarily helping at best. He wishes to continue with current care.   All questions/concerns addressed. Emotional support provided.   Exam: Alert, oriented. No distress. Thin, frail. Breathing regular, unlabored at rest. Abd distended. Generalized weakness and fatigue.   Plan: - DNR/DNI - Continue current treatment - He is hopeful for improvement to return back home - Bacitracin ointment ordered PRN for penile pain  50 min  Yong Channel, NP Palliative Medicine Team Pager [phone] (Please see [email or web address] for schedule) Team Phone [phone]    Greater than 50%  of this time was spent counseling and coordinating care related to the above assessment and plan

## 2023-07-20 NOTE — Consult Note (Signed)
[facility] Resident Consult Note  Reason for Consult: AKI and hyponatremia Referring Physician: Leroy Sea, MD  HPI: Hospital day 2.  67 y.o. with history of cardiac amyloidosis, ischemic cardiomyopathy, anticoagulated for A-fib, hepatic cirrhosis, and CKD 3B admitted for acute blood loss anemia due to left rectus abdominis hematoma and hemoperitoneum, now with worsening renal function.  Doing okay today, somewhat better than day of admission. Notes normal urine output prior to admission. Urine may have been somewhat darker than usual in days leading to hospitalization. He has in-dwelling urinary catheter for last 1.5 months for retention in setting of BPH. No breathing difficulties, flank pain. Declining functional status over last few years, used to enjoy active lifestyle but is now limited by chronic illness and fatigue. No history of hepatorenal syndrome noted. Family history negative for kidney disease/dialysis dependence in first degree relatives. Relevant home medications include sglt-2i, torsemide and midodrine. He was on spironolactone at one point but this was stopped prior to hospitalization.  Trend in Creatinine: Creatinine, Ser  Date/Time Value Ref Range Status  07/20/2023 06:09 AM 2.42 (H) 0.61 - 1.24 mg/dL Final  51/88/4166 06:30 PM 2.10 (H) 0.61 - 1.24 mg/dL Final  16/10/930 35:57 AM 1.89 (H) 0.61 - 1.24 mg/dL Final  32/20/2542 70:62 AM 2.04 (H) 0.61 - 1.24 mg/dL Final  37/62/8315 17:61 PM 2.23 (H) 0.61 - 1.24 mg/dL Final  60/73/7106 26:94 PM 2.09 (H) 0.61 - 1.24 mg/dL Final  85/46/2703 50:09 PM 2.42 (H) 0.61 - 1.24 mg/dL Final  38/18/2993 71:69 PM 1.95 (H) 0.40 - 1.50 mg/dL Final  67/89/3810 17:51 AM 2.36 (H) 0.40 - 1.50 mg/dL Final  02/58/5277 82:42 PM 1.90 (H) 0.61 - 1.24 mg/dL Final  35/36/1443 15:40 AM 1.93 (H) 0.40 - 1.50 mg/dL Final  08/67/6195 09:32 PM 1.78 (H) 0.61 - 1.24 mg/dL Final  67/09/4579 99:83 AM 1.76 (H) 0.61 - 1.24 mg/dL Final   38/25/0539 76:73 PM 1.88 (H) 0.61 - 1.24 mg/dL Final  41/93/7902 40:97 PM 1.51 (H) 0.61 - 1.24 mg/dL Final  35/32/9924 26:83 AM 1.40 (H) 0.61 - 1.24 mg/dL Final  41/96/2229 79:89 AM 1.40 (H) 0.61 - 1.24 mg/dL Final  21/19/4174 08:14 AM 1.73 (H) 0.61 - 1.24 mg/dL Final  48/18/5631 49:70 PM 1.69 (H) 0.61 - 1.24 mg/dL Final  26/37/8588 50:27 PM 1.36 (H) 0.61 - 1.24 mg/dL Final  74/09/8785 76:72 AM 1.76 (H) 0.61 - 1.24 mg/dL Final  09/47/0962 83:66 PM 2.16 (H) 0.61 - 1.24 mg/dL Final  29/47/6546 50:35 AM 1.58 (H) 0.61 - 1.24 mg/dL Final  46/56/8127 51:70 PM 1.68 (H) 0.76 - 1.27 mg/dL Final  01/74/9449 67:59 AM 1.64 (H) 0.76 - 1.27 mg/dL Final  16/38/4665 99:35 AM 1.57 (H) 0.76 - 1.27 mg/dL Final  70/17/7939 03:00 PM 1.41 (H) 0.76 - 1.27 mg/dL Final  92/33/0076 22:63 PM 1.61 (H) 0.76 - 1.27 mg/dL Final  33/54/5625 63:89 PM 1.64 (H) 0.76 - 1.27 mg/dL Final  37/34/2876 81:15 AM 1.33 (H) 0.76 - 1.27 mg/dL Final  72/62/0355 97:41 PM 1.43 (H) 0.76 - 1.27 mg/dL Final  63/84/5364 68:03 AM 1.26 (H) 0.61 - 1.24 mg/dL Final  21/22/4825 00:37 AM 1.17 0.61 - 1.24 mg/dL Final  04/88/8916 94:50 PM 1.30 (H) 0.61 - 1.24 mg/dL Final  38/88/2800 34:91 PM 1.45 (H) 0.76 - 1.27 mg/dL Final  79/15/0569 79:48 AM 1.32 (H) 0.76 - 1.27 mg/dL Final  01/65/5374 82:70 PM 1.33 (H) 0.76 - 1.27 mg/dL Final  78/67/5449 20:10 AM 1.20 0.76 - 1.27 mg/dL Final  11/03/2016 10:25 PM 1.30 (H) 0.61 - 1.24 mg/dL Final    PMH:   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   HLD (hyperlipidemia)    Hx of adenomatous polyp of colon 12/03/2020   Hypertension    Indwelling urinary catheter present 04/2023   OSA on CPAP    Sleep apnea     PSH:   Past Surgical  History:  Procedure Laterality Date   ABLATION     AORTIC VALVE REPLACEMENT  2011   CERVICAL DISC SURGERY     spinal stenosis   COLONOSCOPY     ESOPHAGOGASTRODUODENOSCOPY     IR PARACENTESIS  05/12/2023   IR PARACENTESIS  06/27/2023   IR PARACENTESIS  07/14/2023   IR PARACENTESIS  07/17/2023   IR PARACENTESIS  07/19/2023   RIGHT/LEFT HEART CATH AND CORONARY/GRAFT ANGIOGRAPHY N/A 03/18/2021   Procedure: RIGHT/LEFT HEART CATH AND CORONARY/GRAFT ANGIOGRAPHY;  Surgeon: Corky Crafts, MD;  Location: [facility];  Service: Cardiovascular;  Laterality: N/A;   TONSILLECTOMY      Allergies:  Allergies  Allergen Reactions   Lipitor [Atorvastatin] Other (See Comments)    Body aches   Penicillins Swelling    **Tolerates cephalosporins Swollen face   Penicillin G     Other reaction(s): Unknown    Medications:   Prior to Admission medications   Medication Sig Start Date End Date Taking? Authorizing Provider  allopurinol (ZYLOPRIM) 300 MG tablet Take 300 mg by mouth daily. 11/23/19  Yes [provider]  colchicine 0.6 MG tablet Take 0.6 mg by mouth as needed. 02/14/23  Yes [provider]  empagliflozin (JARDIANCE) 10 MG TABS tablet take 1 tablet by mouth daily before breakfast 02/14/23  Yes Bensimhon, Bevelyn Buckles, MD  Ensure Max Protein (ENSURE MAX PROTEIN) LIQD Take 330 mLs (11 oz total) by mouth at bedtime. 12/20/21  Yes Sheikh, Omair Latif, DO  ferrous sulfate 325 (65 FE) MG tablet Take 1 tablet (325 mg total) by mouth daily with breakfast. 03/09/23  Yes [name], MD  loratadine (CLARITIN) 10 MG tablet Take 10 mg by mouth daily as needed for allergies.   Yes [provider]  midodrine (PROAMATINE) 10 MG tablet Take 1 tablet (10 mg total) by mouth 2 (two) times daily with a meal. 05/04/23  Yes Bensimhon, Bevelyn Buckles, MD  mirtazapine (REMERON) 15 MG tablet Take 15 mg by mouth at bedtime.   Yes [provider]  Multiple Vitamins-Minerals  (CERTAVITE/ANTIOXIDANTS) TABS Take 1 tablet by mouth daily. 12/21/21  Yes Sheikh, Omair Latif, DO  pantoprazole (PROTONIX) 40 MG tablet Take 1 tablet (40 mg total) by mouth 2 (two) times daily. 07/17/23 08/16/23 Yes Ghimire, Lyndel Safe, MD  Rivaroxaban (XARELTO) 15 MG TABS tablet Take 15 mg by mouth daily with supper.   Yes [provider]  rosuvastatin (CRESTOR) 5 MG tablet Take 5 mg by mouth every evening. 08/26/16  Yes [provider]  Tafamidis (VYNDAMAX) 61 MG CAPS Take 1 capsule by mouth daily. 09/08/22  Yes Bensimhon, Bevelyn Buckles, MD  tamsulosin (FLOMAX) 0.4 MG CAPS capsule Take 0.4 mg by mouth at bedtime. 06/09/23  Yes [provider]  torsemide (DEMADEX) 20 MG tablet Take 2 tablets (40 mg total)  by mouth 2 (two) times daily for 2 days, THEN 2 tablets (40 mg total) daily. Patient taking differently:  2 tablets (40 mg total) daily. 06/02/23 07/18/23 Yes Bensimhon, Bevelyn Buckles, MD  Vitamin A 2400 MCG (8000 UT) CAPS Take 1 capsule by mouth daily. 06/29/23  Yes Bensimhon, Bevelyn Buckles, MD  vutrisiran sodium (AMVUTTRA) 25 MG/0.5ML syringe Inject 0.5 mLs (25 mg total) into the skin every 3 (three) months. 06/23/23  Yes Bensimhon, Bevelyn Buckles, MD  zolpidem (AMBIEN) 5 MG tablet Take 5 mg by mouth at bedtime as needed. 02/13/23  Yes [provider]  glucose blood (ONETOUCH VERIO) test strip TEST ONCE A DAY DX E11.29    [provider]  tadalafil, PAH, (ADCIRCA) 20 MG tablet Take 20 mg by mouth daily as needed (ED). Patient not taking: Reported on 07/18/2023 05/06/21   [provider]    Inpatient medications:  allopurinol  300 mg Oral Daily   Chlorhexidine Gluconate Cloth  6 each Topical Daily   midodrine  10 mg Oral TID WC   mirtazapine  15 mg Oral QHS   pantoprazole (PROTONIX) IV  40 mg Intravenous Q12H   Ensure Max Protein  11 oz Oral QHS   rosuvastatin  5 mg Oral QPM   Tafamidis  1 capsule Oral Daily   tamsulosin  0.4 mg Oral QHS   [START ON 07/21/2023] torsemide   40 mg Oral Daily    Discontinued Meds:   Medications Discontinued During This Encounter  Medication Reason   empagliflozin (JARDIANCE) tablet 10 mg    midodrine (PROAMATINE) tablet 10 mg    midodrine (PROAMATINE) tablet 10 mg    torsemide (DEMADEX) tablet 40 mg     Social History:  reports that he quit smoking about 41 years ago. His smoking use included cigarettes. He started smoking about 51 years ago. He has a 2.5 pack-year smoking history. He has never used smokeless tobacco. He reports current alcohol use. He reports that he does not use drugs.  Family History:   Family History  Problem Relation Age of Onset   Congenital heart disease Mother    Diabetes Mother    Hypertension Mother    Heart disease Mother    Heart attack Father    Cervical cancer Sister    Diabetes Sister    Hypertension Brother    Diabetes Brother    Pancreatic cancer Brother    Diabetes Brother    Breast cancer Sister    Diabetes Sister    Diabetes Sister    Diabetes Sister    Diabetes Sister    Diabetes Son    Colon cancer Neg Hx    Colon polyps Neg Hx    Esophageal cancer Neg Hx    Stomach cancer Neg Hx     Physical Exam: Weight change: 2.5 kg  Intake/Output Summary (Last 24 hours) at 07/20/2023 1052 Last data filed at 07/19/2023 2000 Gross per 24 hour  Intake --  Output 1000 ml  Net -1000 ml   BP (!) 83/64 (BP Location: Right Arm)   Pulse 81   Temp 97.9 F (36.6 C) (Oral)   Resp 15   Ht 6\' 2"  (1.88 m)   Wt 77.1 kg   SpO2 100%   BMI 21.82 kg/m  Vitals:   07/20/23 0045 07/20/23 0200 07/20/23 0400 07/20/23 0800  BP: 92/71 (!) 86/67 90/66 (!) 83/64  Pulse: 83 80 81 81  Resp: 12 14 14 15   Temp:   97.8 F (  36.6 C) 97.9 F (36.6 C)  TempSrc:   Axillary Oral  SpO2: 100% 99% 99% 100%  Weight:   77.1 kg   Height:          No distress Heart rate and rhythm normal, strong left radial pulse, holosystolic murmur, LE pitting edema halfway to knees, JVP to mandible Breathing  comfortably, anterior lung fields clear Abdomen is distended and non-tender Skin is warm and dry, no apparent scleral icterus Alert and oriented  Basic Metabolic Panel: Recent Labs  Lab 07/14/23 1621 07/15/23 0150 07/17/23 0243 07/18/23 1250 07/20/23 0609  NA 128* 128* 128* 125* 126*  K 4.3 4.0 5.3* 5.0 4.7  CL 97* 96* 101 97* 97*  CO2 24 20* 23 19* 17*  GLUCOSE 109* 127* 99 99 74  BUN 48* 43* 33* 42* 49*  CREATININE 2.23* 2.04* 1.89* 2.10* 2.42*  ALBUMIN 2.7* 2.7* 2.3* 2.0* 2.0*  CALCIUM 8.2* 8.1* 8.1* 7.7* 8.0*   Liver Function Tests: Recent Labs  Lab 07/17/23 0243 07/18/23 1250 07/20/23 0609  AST 29 31 31   ALT 8 7 9   ALKPHOS 89 71 76  BILITOT 1.2 1.4* 1.8*  PROT 6.1* 5.4* 5.7*  ALBUMIN 2.3* 2.0* 2.0*   Recent Labs  Lab 07/18/23 1250  LIPASE 96*   Recent Labs  Lab 07/15/23 0150  AMMONIA 21   CBC: Recent Labs  Lab 07/14/23 1621 07/15/23 0150 07/17/23 0243 07/18/23 1250 07/18/23 1841 07/19/23 0314 07/19/23 1107 07/19/23 1829 07/20/23 0609  WBC 4.5  --  4.6 7.6  --   --   --   --  7.0  NEUTROABS  --   --  3.7 6.9  --   --   --   --   --   HGB 9.5*   < > 10.5* 7.1*   < > 9.2* 8.9* 9.7* 9.1*  HCT 29.3*   < > 32.2* 22.2*   < > 26.9* 26.0* 29.3* 26.2*  MCV 89.9  --  88.0 91.0  --   --   --   --  85.1  PLT 339  --  321 242  --   --   --   --  260   < > = values in this interval not displayed.   PT/INR: Recent Labs  Lab 07/14/23 1621 07/15/23 0150 07/18/23 1250 07/19/23 1446  INR 2.1* 1.9* 3.9* 1.7*   No results for input(s): "CKTOTAL", "CKMB", "CKMBINDEX", "TROPONINI" in the last 168 hours. CBG: Recent Labs  Lab 07/17/23 0027 07/17/23 0421 07/17/23 0618 07/17/23 0733 07/17/23 1155  GLUCAP 89 96 110* 94 86    Iron Studies: No results for input(s): "IRON", "TIBC", "TRANSFERRIN", "FERRITIN" in the last 168 hours.  Xrays/Other Studies: IR Paracentesis  Result Date: 07/19/2023 INDICATION: Heart failure, hepatic cirrhosis, and recurrent  ascites. Request for therapeutic paracentesis. EXAM: ULTRASOUND GUIDED PARACENTESIS MEDICATIONS: 1% lidocaine 10 mL COMPLICATIONS: None immediate. PROCEDURE: Informed written consent was obtained from the patient after a discussion of the risks, benefits and alternatives to treatment. A timeout was performed prior to the initiation of the procedure. Initial ultrasound scanning demonstrates a large amount of ascites within the right lower abdominal quadrant. The right lower abdomen was prepped and draped in the usual sterile fashion. 1% lidocaine was used for local anesthesia. Following this, a 19 gauge, 7-cm, Yueh catheter was introduced. An ultrasound image was saved for documentation purposes. The paracentesis was performed. The catheter was removed and a dressing was applied. The patient tolerated the procedure  well without immediate post procedural complication. FINDINGS: Only 400 mL of blood was removed. The procedure was stopped secondary to patient developing severe hypotension and concern for bleeding into the abdomen. IMPRESSION: Successful ultrasound-guided paracentesis yielding 400 mL of blood. Critical results were discussed with the medicine team at the time of procedure. Procedure performed by: Corrin Parker, PA-C Electronically Signed   By: Malachy Moan M.D.   On: 07/19/2023 11:42   CT ABDOMEN PELVIS W CONTRAST  Result Date: 07/18/2023 CLINICAL DATA:  Anemia, abdominal wall hematoma EXAM: CT ABDOMEN AND PELVIS WITH CONTRAST TECHNIQUE: Multidetector CT imaging of the abdomen and pelvis was performed using the standard protocol following bolus administration of intravenous contrast. RADIATION DOSE REDUCTION: This exam was performed according to the departmental dose-optimization program which includes automated exposure control, adjustment of the mA and/or kV according to patient size and/or use of iterative reconstruction technique. CONTRAST:  60mL OMNIPAQUE IOHEXOL 350 MG/ML SOLN COMPARISON:   07/14/2023 FINDINGS: Lower chest: Small right and trace left pleural effusions are present. Bibasilar pulmonary parenchymal scarring. Median sternotomy has been performed. No acute abnormality. Hepatobiliary: Cirrhotic changes better appreciated on MRI examination 07/15/2023. Known hypoenhancing lesion within the left hepatic lobe is not well appreciated on this examination. No gallstones, gallbladder wall thickening, or biliary dilatation. Pancreas: Unremarkable Spleen: Unremarkable Adrenals/Urinary Tract: The adrenal glands are unremarkable. The kidneys are normal in size and position. Simple cortical cyst noted within the interpolar region of the right kidney for which no follow-up imaging is recommended. The kidneys are otherwise unremarkable. Foley catheter balloon seen within a decompressed bladder lumen. Stomach/Bowel: Large volume ascites, progressive since prior examination. Stomach, small bowel, and large bowel are unremarkable. No free intraperitoneal gas. Vascular/Lymphatic: Extensive aortoiliac atherosclerotic calcification. No aortic aneurysm. No pathologic adenopathy within the abdomen and pelvis. Reproductive: Moderate prostatic hypertrophy. Other: Intramuscular hematoma within the left periumbilical rectus abdominus musculature has decreased in size in keeping with partial resolution. 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 Active extravasation, while not completely excluded on this single phase exam, is considered less likely given the interval decrease in size of the rectus sheath hematoma. Musculoskeletal: No acute bone abnormality. No lytic or blastic bone lesion. Osseous structures are age-appropriate. IMPRESSION: 1. Interval decrease in size of the  left periumbilical rectus abdominus hematoma in keeping with partial resolution. 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 Active extravasation, while not completely excluded on this single phase exam, is considered less likely given the interval decrease in size of the rectus sheath hematoma. This could be further assessed with dedicated sonography if indicated. 2. Cirrhotic changes better appreciated on MRI examination 07/15/2023. Known hypoenhancing lesion within the left hepatic lobe is not well appreciated on this examination. 3. Large volume ascites, progressive since prior examination. 4. Small right and trace left pleural effusions. 5. Moderate prostatic hypertrophy. Aortic Atherosclerosis (ICD10-I70.0). Electronically Signed   By: Helyn Numbers M.D.   On: 07/18/2023 18:45     Assessment/Plan:  AKI on CKD IIIb Query pre-renal azotemia due to hepatorenal syndrome versus ischemic ATN in setting of hypotension and rectus hematoma with acute  anemia. Appreciate urine sodium and osmolality ordered. Will add UA. Agree with starting albumin and continuing outpatient midodrine. Recommend bladder pressure to evaluate for abdominal compartment syndrome--note he had 5.7 L from paracentesis on 9/20 with small improvement in creatinine on that day. Unfortunately equipment limitations preclude measuring bladder pressure. Cardiorenal is also a consideration but with the cirrhosis, marginal blood pressures, and hypervolemia not worse than baseline this seems less likely. Continue holding torsemide and empagliflozin for now pending renal recovery. Hopeful for renal recovery as this person would not do well with RRT.  Hyponatremia Hypovolemic hyponatremia in this person with  heart failure and cirrhosis. Not overtly symptomatic. Unfortunately options are limited given concomitant AKI. Optimize renal function before restarting diuretics and SGLT-2i.  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 With ascites and AKI. Ceftriaxone empirically. GI following.  Acute blood loss anemia From rectus abdominis hematoma. Holding anticoagulation, PPI on-board in case of intraluminal GI bleed.  Chronic CHF From cardiac amyloidosis. Follows with advanced heart failure. Don't think he's decompensated from this, at present. Note continuation of tafamidis for amyloid.  Final recommendations pending attending attestation.  Marrianne Mood MD 07/20/2023, 10:52 AM

## 2023-07-20 NOTE — Progress Notes (Signed)
Progress Note   Subjective  Chief Complaint: Decompensated cirrhosis  This morning, patient tells me he is actually feeling okay, he does not think his abdomen has gotten any bigger since yesterday.  Denies any other new complaints or concerns.  No further bowel movements.    Objective   Vital signs in last 24 hours: Temp:  [97.8 F (36.6 C)-99.1 F (37.3 C)] 97.9 F (36.6 C) (09/26 0800) Pulse Rate:  [80-90] 81 (09/26 0800) Resp:  [12-21] 15 (09/26 0800) BP: (80-109)/(63-74) 83/64 (09/26 0800) SpO2:  [98 %-100 %] 100 % (09/26 0800) Weight:  [77.1 kg] 77.1 kg (09/26 0400) Last BM Date : 07/19/23 General:  AA male in NAD Heart:  Regular rate and rhythm; no murmurs Lungs: Respirations even and unlabored, lungs CTA bilaterally Abdomen:  Soft, nontender and mild distention. Normal bowel sounds. Psych:  Cooperative. Normal mood and affect.  Intake/Output from previous day: 09/25 0701 - 09/26 0700 In: -  Out: 1000 [Urine:1000] Intake/Output this shift: Total I/O In: 320 [P.O.:320] Out: -   Lab Results: Recent Labs    07/18/23 1250 07/18/23 1841 07/19/23 1107 07/19/23 1829 07/20/23 0609  WBC 7.6  --   --   --  7.0  HGB 7.1*   < > 8.9* 9.7* 9.1*  HCT 22.2*   < > 26.0* 29.3* 26.2*  PLT 242  --   --   --  260   < > = values in this interval not displayed.   BMET Recent Labs    07/18/23 1250 07/20/23 0609  NA 125* 126*  K 5.0 4.7  CL 97* 97*  CO2 19* 17*  GLUCOSE 99 74  BUN 42* 49*  CREATININE 2.10* 2.42*  CALCIUM 7.7* 8.0*      Latest Ref Rng & Units 07/20/2023    6:09 AM 07/18/2023   12:50 PM 07/17/2023    2:43 AM  Hepatic Function  Total Protein 6.5 - 8.1 g/dL 5.7  5.4  6.1   Albumin 3.5 - 5.0 g/dL 2.0  2.0  2.3   AST 15 - 41 U/L 31  31  29    ALT 0 - 44 U/L 9  7  8    Alk Phosphatase 38 - 126 U/L 76  71  89   Total Bilirubin 0.3 - 1.2 mg/dL 1.8  1.4  1.2      PT/INR Recent Labs    07/18/23 1250 07/19/23 1446  LABPROT 38.5* 20.0*  INR 3.9*  1.7*    Studies/Results: IR Paracentesis  Result Date: 07/19/2023 INDICATION: Heart failure, hepatic cirrhosis, and recurrent ascites. Request for therapeutic paracentesis. EXAM: ULTRASOUND GUIDED PARACENTESIS MEDICATIONS: 1% lidocaine 10 mL COMPLICATIONS: None immediate. PROCEDURE: Informed written consent was obtained from the patient after a discussion of the risks, benefits and alternatives to treatment. A timeout was performed prior to the initiation of the procedure. Initial ultrasound scanning demonstrates a large amount of ascites within the right lower abdominal quadrant. The right lower abdomen was prepped and draped in the usual sterile fashion. 1% lidocaine was used for local anesthesia. Following this, a 19 gauge, 7-cm, Yueh catheter was introduced. An ultrasound image was saved for documentation purposes. The paracentesis was performed. The catheter was removed and a dressing was applied. The patient tolerated the procedure well without immediate post procedural complication. FINDINGS: Only 400 mL of blood was removed. The procedure was stopped secondary to patient developing severe hypotension and concern for bleeding into the abdomen. IMPRESSION: Successful ultrasound-guided paracentesis yielding  400 mL of blood. Critical results were discussed with the medicine team at the time of procedure. Procedure performed by: Corrin Parker, PA-C Electronically Signed   By: Malachy Moan M.D.   On: 07/19/2023 11:42   CT ABDOMEN PELVIS W CONTRAST  Result Date: 07/18/2023 CLINICAL DATA:  Anemia, abdominal wall hematoma EXAM: CT ABDOMEN AND PELVIS WITH CONTRAST TECHNIQUE: Multidetector CT imaging of the abdomen and pelvis was performed using the standard protocol following bolus administration of intravenous contrast. RADIATION DOSE REDUCTION: This exam was performed according to the departmental dose-optimization program which includes automated exposure control, adjustment of the mA and/or kV  according to patient size and/or use of iterative reconstruction technique. CONTRAST:  60mL OMNIPAQUE IOHEXOL 350 MG/ML SOLN COMPARISON:  07/14/2023 FINDINGS: Lower chest: Small right and trace left pleural effusions are present. Bibasilar pulmonary parenchymal scarring. Median sternotomy has been performed. No acute abnormality. Hepatobiliary: Cirrhotic changes better appreciated on MRI examination 07/15/2023. Known hypoenhancing lesion within the left hepatic lobe is not well appreciated on this examination. No gallstones, gallbladder wall thickening, or biliary dilatation. Pancreas: Unremarkable Spleen: Unremarkable Adrenals/Urinary Tract: The adrenal glands are unremarkable. The kidneys are normal in size and position. Simple cortical cyst noted within the interpolar region of the right kidney for which no follow-up imaging is recommended. The kidneys are otherwise unremarkable. Foley catheter balloon seen within a decompressed bladder lumen. Stomach/Bowel: Large volume ascites, progressive since prior examination. Stomach, small bowel, and large bowel are unremarkable. No free intraperitoneal gas. Vascular/Lymphatic: Extensive aortoiliac atherosclerotic calcification. No aortic aneurysm. No pathologic adenopathy within the abdomen and pelvis. Reproductive: Moderate prostatic hypertrophy. Other: Intramuscular hematoma within the left periumbilical rectus abdominus musculature has decreased in size in keeping with partial resolution. 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 Active extravasation, while not completely excluded on this single phase exam, is considered less likely given the interval decrease in size of the rectus sheath hematoma. Musculoskeletal: No acute  bone abnormality. No lytic or blastic bone lesion. Osseous structures are age-appropriate. IMPRESSION: 1. Interval decrease in size of the left periumbilical rectus abdominus hematoma in keeping with partial resolution. 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 Active extravasation, while not completely excluded on this single phase exam, is considered less likely given the interval decrease in size of the rectus sheath hematoma. This could be further assessed with dedicated sonography if indicated. 2. Cirrhotic changes better appreciated on MRI examination 07/15/2023. Known hypoenhancing lesion within the left hepatic lobe is not well appreciated on this examination. 3. Large volume ascites, progressive since prior examination. 4. Small right and trace left pleural effusions. 5. Moderate prostatic hypertrophy. Aortic Atherosclerosis (ICD10-I70.0). Electronically Signed   By: Helyn Numbers M.D.   On: 07/18/2023 18:45     Assessment / Plan:   Assessment: 1.  Cirrhosis: Thought possibly cardiac related, 3 L of fluid removed on 07/17/2023, abdomen is distended, but patient is not complaining with a low blood pressure, Midodrine and albumin have been ordered today 2.  Bicuspid aortic valve status post AVR 3.  Heart failure R EF (EF 45-50%, grade 3 diastolic dysfunction) 4.  A-fib: On Xarelto-currently on hold 5.  CAD status post CABG 6.  CKD 7.  Anemia: Hemoglobin stable at 9.7, CTA showed a rectus abdominis muscle hematoma with active extravasation, repeat CT showed hematoma decreased in size slightly but there was a pseudoaneurysm within the rectus sheath, patient went for paracentesis yesterday and unfortunately this revealed frank blood coming out with about 400  mL of output before being stopped, vitamin K and Kcentra ordered  Plan: 1.  Continue to trend hemoglobin 2.  Continue PPI and Ceftriaxone 3.  Will let Dr. Leonides Schanz determine if we can advance his diet from clears today 4.  Per hospitalist team and they have added Midodrine and Albumin today, will leave decision for repeat paracentesis to Dr. Leonides Schanz, not sure how successful this would be after last attempt recently  Thank you for your kind consultation, we will continue to follow.    LOS: 2 days   Unk Lightning  07/20/2023, 12:34 PM

## 2023-07-20 NOTE — Evaluation (Signed)
Physical Therapy Evaluation Patient Details Name: Frank Berg MRN: [id number] DOB: 04/06/1956 Today's Date: 07/20/2023  History of Present Illness  67 y/o male presents to [facility] 07/18/23 with lightheadedness, weakness, and syncopal episode. Admitted with acute blood loss anemia and intramuscular hematoma of rectus abdominus. Prior hospitalization 9/20 for portable UGI bleed. 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   Clinical Impression  Pt in bed upon arrival and agreeable to PT eval. Pt presents to therapy session close to baseline with decreased activity tolerance and functional mobility. Pt ambulates with no AD at baseline. Limited eval today due to difficulty with SpO2 readings (see below). Pt was able to transfer from bed to chair ambulating a short distance with CGA for safety and line management. Anticipate pt will progress well with acute PT and have no further needs upon discharge. Acute PT to follow.          If plan is discharge home, recommend the following: A little help with walking and/or transfers     Equipment Recommendations None recommended by PT     Functional Status Assessment Patient has had a recent decline in their functional status and demonstrates the ability to make significant improvements in function in a reasonable and predictable amount of time.     Precautions / Restrictions Precautions Precautions: Fall Restrictions Weight Bearing Restrictions: No      Mobility  Bed Mobility Overal bed mobility: Needs Assistance Bed Mobility: Supine to Sit     Supine to sit: Min assist, HOB elevated, Used rails     General bed mobility comments: MinA with 1 HH support due to bed setting on knees elevated (unable to change setting prior to movement)    Transfers Overall transfer level: Needs assistance Equipment used:  None Transfers: Sit to/from Stand Sit to Stand: Contact guard assist           General transfer comment: CGA for safety and line management    Ambulation/Gait Ambulation/Gait assistance: Contact guard assist Gait Distance (Feet): 10 Feet Assistive device: None Gait Pattern/deviations: Step-through pattern, Decreased stride length, Shuffle Gait velocity: dec     General Gait Details: transferred from bed to chair, limited distance due to SpO2 monitor alerting 72 SpO2. Likely poor reading as pt hands were cold w/ no symptoms. RN present and about to give meds.        Balance Overall balance assessment: No apparent balance deficits (not formally assessed)         Pertinent Vitals/Pain Pain Assessment Pain Assessment: No/denies pain    Home Living Family/patient expects to be discharged to:: Private residence Living Arrangements: Spouse/significant other (spouse) Available Help at Discharge: Available 24 hours/day;Family Type of Home: House Home Access: Level entry     Alternate Level Stairs-Number of Steps: 12 Home Layout: Two level;Bed/bath upstairs;Able to live on main level with bedroom/bathroom Home Equipment: None      Prior Function Prior Level of Function : Independent/Modified Independent      Mobility Comments: no AD use ADLs Comments: independent     Extremity/Trunk Assessment   Upper Extremity Assessment Upper Extremity Assessment: Overall WFL for tasks assessed    Lower Extremity Assessment Lower Extremity Assessment: Overall WFL for tasks assessed    Cervical / Trunk Assessment Cervical / Trunk Assessment: Normal  Communication   Communication Communication: No apparent difficulties Cueing Techniques: Verbal cues  Cognition Arousal: Alert Behavior During Therapy: [facility] for  tasks assessed/performed Overall Cognitive Status: Within Functional Limits for tasks assessed       General Comments General comments (skin integrity, edema,  etc.): Poor reading of SpO2 monitor with movement, likely due to cold hands as reading would jump quickly from 72 to 98    Exercises General Exercises - Lower Extremity Long Arc Quad: AROM, Seated, Both, 10 reps Hip Flexion/Marching: AROM, Seated, Both, 10 reps      PT Assessment Patient needs continued PT services  PT Problem List Decreased activity tolerance;Decreased mobility       PT Treatment Interventions Neuromuscular re-education;DME instruction;Gait training;Stair training;Functional mobility training;Therapeutic activities;Therapeutic exercise;Balance training;Patient/family education    PT Goals (Current goals can be found in the Care Plan section)  Acute Rehab PT Goals Patient Stated Goal: to go home PT Goal Formulation: With patient Time For Goal Achievement: 08/03/23 Potential to Achieve Goals: Good    Frequency Min 1X/week        AM-PAC PT "6 Clicks" Mobility  Outcome Measure Help needed turning from your back to your side while in a flat bed without using bedrails?: None Help needed moving from lying on your back to sitting on the side of a flat bed without using bedrails?: A Little Help needed moving to and from a bed to a chair (including a wheelchair)?: A Little Help needed standing up from a chair using your arms (e.g., wheelchair or bedside chair)?: A Little Help needed to walk in hospital room?: A Little Help needed climbing 3-5 steps with a railing? : A Little 6 Click Score: 19    End of Session Equipment Utilized During Treatment: Gait belt Activity Tolerance: Patient tolerated treatment well Patient left: in chair;with call bell/phone within reach;with chair alarm set Nurse Communication: Mobility status (SpO2 reading) PT Visit Diagnosis: Other abnormalities of gait and mobility (R26.89)    Time: 6578-4696 PT Time Calculation (min) (ACUTE ONLY): 28 min   Charges:   PT Evaluation $PT Eval Low Complexity: 1 Low PT Treatments $Therapeutic  Exercise: 8-22 mins PT General Charges $$ ACUTE PT VISIT: 1 Visit        Hilton Cork, PT, DPT Secure Chat Preferred  Rehab Office [phone]   Arturo Morton Brion Aliment 07/20/2023, 10:19 AM

## 2023-07-20 NOTE — Progress Notes (Signed)
   07/20/23 2032  BiPAP/CPAP/SIPAP  $ Non-Invasive Home Ventilator  Subsequent  BiPAP/CPAP/SIPAP Pt Type Adult  BiPAP/CPAP/SIPAP Resmed  Mask Type Full face mask  Mask Size Large  PEEP 15 cmH20  FiO2 (%) 21 %  Patient Home Equipment Yes  CPAP/SIPAP surface wiped down Yes  Safety Check Completed by RT for Home Unit Yes, no issues noted

## 2023-07-21 DIAGNOSIS — D62 Acute posthemorrhagic anemia: Secondary | ICD-10-CM | POA: Diagnosis not present

## 2023-07-21 DIAGNOSIS — Z7189 Other specified counseling: Secondary | ICD-10-CM | POA: Diagnosis not present

## 2023-07-21 DIAGNOSIS — Z515 Encounter for palliative care: Secondary | ICD-10-CM | POA: Diagnosis not present

## 2023-07-21 DIAGNOSIS — N179 Acute kidney failure, unspecified: Secondary | ICD-10-CM | POA: Diagnosis not present

## 2023-07-21 DIAGNOSIS — K7460 Unspecified cirrhosis of liver: Secondary | ICD-10-CM | POA: Diagnosis not present

## 2023-07-21 DIAGNOSIS — K661 Hemoperitoneum: Secondary | ICD-10-CM | POA: Diagnosis not present

## 2023-07-21 DIAGNOSIS — N1832 Chronic kidney disease, stage 3b: Secondary | ICD-10-CM

## 2023-07-21 DIAGNOSIS — I5042 Chronic combined systolic (congestive) and diastolic (congestive) heart failure: Secondary | ICD-10-CM | POA: Diagnosis not present

## 2023-07-21 DIAGNOSIS — I959 Hypotension, unspecified: Secondary | ICD-10-CM | POA: Diagnosis not present

## 2023-07-21 LAB — CBC WITH DIFFERENTIAL/PLATELET
Abs Immature Granulocytes: 0.05 10*3/uL (ref 0.00–0.07)
Basophils Absolute: 0 10*3/uL (ref 0.0–0.1)
Basophils Relative: 0 %
Eosinophils Absolute: 0.1 10*3/uL (ref 0.0–0.5)
Eosinophils Relative: 1 %
HCT: 25.9 % — ABNORMAL LOW (ref 39.0–52.0)
Hemoglobin: 8.5 g/dL — ABNORMAL LOW (ref 13.0–17.0)
Immature Granulocytes: 1 %
Lymphocytes Relative: 6 %
Lymphs Abs: 0.4 10*3/uL — ABNORMAL LOW (ref 0.7–4.0)
MCH: 27.8 pg (ref 26.0–34.0)
MCHC: 32.8 g/dL (ref 30.0–36.0)
MCV: 84.6 fL (ref 80.0–100.0)
Monocytes Absolute: 0.4 10*3/uL (ref 0.1–1.0)
Monocytes Relative: 6 %
Neutro Abs: 6.2 10*3/uL (ref 1.7–7.7)
Neutrophils Relative %: 86 %
Platelets: 209 10*3/uL (ref 150–400)
RBC: 3.06 MIL/uL — ABNORMAL LOW (ref 4.22–5.81)
RDW: 16.7 % — ABNORMAL HIGH (ref 11.5–15.5)
WBC: 7.2 10*3/uL (ref 4.0–10.5)
nRBC: 0 % (ref 0.0–0.2)

## 2023-07-21 LAB — BASIC METABOLIC PANEL
Anion gap: 13 (ref 5–15)
BUN: 50 mg/dL — ABNORMAL HIGH (ref 8–23)
CO2: 17 mmol/L — ABNORMAL LOW (ref 22–32)
Calcium: 8.5 mg/dL — ABNORMAL LOW (ref 8.9–10.3)
Chloride: 95 mmol/L — ABNORMAL LOW (ref 98–111)
Creatinine, Ser: 2.44 mg/dL — ABNORMAL HIGH (ref 0.61–1.24)
GFR, Estimated: 28 mL/min — ABNORMAL LOW (ref >60)
Glucose, Bld: 119 mg/dL — ABNORMAL HIGH (ref 70–99)
Potassium: 4.2 mmol/L (ref 3.5–5.1)
Sodium: 125 mmol/L — ABNORMAL LOW (ref 135–145)

## 2023-07-21 LAB — PROCALCITONIN: Procalcitonin: 4.6 ng/mL

## 2023-07-21 LAB — SODIUM, URINE, RANDOM: Sodium, Ur: <10 mmol/L

## 2023-07-21 LAB — OSMOLALITY, URINE: Osmolality, Ur: 377 mosm/kg (ref 300–900)

## 2023-07-21 LAB — TSH: TSH: 5.633 u[IU]/mL — ABNORMAL HIGH (ref 0.350–4.500)

## 2023-07-21 LAB — MAGNESIUM: Magnesium: 2.4 mg/dL (ref 1.7–2.4)

## 2023-07-21 LAB — BRAIN NATRIURETIC PEPTIDE: B Natriuretic Peptide: 1353.8 pg/mL — ABNORMAL HIGH (ref 0.0–100.0)

## 2023-07-21 LAB — C-REACTIVE PROTEIN: CRP: 20.5 mg/dL — ABNORMAL HIGH (ref <1.0)

## 2023-07-21 LAB — T4, FREE: Free T4: 0.73 ng/dL (ref 0.61–1.12)

## 2023-07-21 MED ORDER — MIDODRINE HCL 5 MG PO TABS
15.0000 mg | ORAL_TABLET | Freq: Three times a day (TID) | ORAL | Status: DC
  Administered 2023-07-21 – 2023-07-24 (×9): 15 mg via ORAL
  Filled 2023-07-21 (×9): qty 3

## 2023-07-21 NOTE — Progress Notes (Signed)
Progress Note   Subjective  Chief Complaint: Decompensated Cirrhosis  This morning, patient tells me that he is tired of laying still in the bed, apparently shortly after being able to get up yesterday he was able to have a bowel movement after MiraLAX usage and it was a good 1 and did help with some of his discomfort.  Tells me he really does not feel like his abdomen is any more distended though remains tympanic on exam.   Objective   Vital signs in last 24 hours: Temp:  [98.3 F (36.8 C)-98.6 F (37 C)] 98.3 F (36.8 C) (09/27 0800) Pulse Rate:  [78-84] 84 (09/27 0800) Resp:  [14-20] 18 (09/27 0800) BP: (87-99)/(67-73) 92/70 (09/27 0800) SpO2:  [92 %-97 %] 95 % (09/27 0800) FiO2 (%):  [21 %] 21 % (09/26 2032) Weight:  [77.1 kg] 77.1 kg (09/27 0500) Last BM Date : 07/20/23 General:   Ill appearing AA male in NAD Heart:  Regular rate and rhythm; no murmurs Lungs: Respirations even and unlabored, lungs CTA bilaterally Abdomen:  Tense, nontender and moderate distension, hypertympanic. Normal bowel sounds. Psych:  Cooperative. Normal mood and affect.  Intake/Output from previous day: 09/26 0701 - 09/27 0700 In: 1219.1 [P.O.:840; IV Piggyback:379.1] Out: 350 [Urine:350] Intake/Output this shift: Total I/O In: 289.1 [I.V.:85.9; IV Piggyback:203.2] Out: -   Lab Results: Recent Labs    07/18/23 1250 07/18/23 1841 07/19/23 1829 07/20/23 0609 07/21/23 0808  WBC 7.6  --   --  7.0 7.2  HGB 7.1*   < > 9.7* 9.1* 8.5*  HCT 22.2*   < > 29.3* 26.2* 25.9*  PLT 242  --   --  260 209   < > = values in this interval not displayed.   BMET Recent Labs    07/18/23 1250 07/20/23 0609 07/21/23 0808  NA 125* 126* 125*  K 5.0 4.7 4.2  CL 97* 97* 95*  CO2 19* 17* 17*  GLUCOSE 99 74 119*  BUN 42* 49* 50*  CREATININE 2.10* 2.42* 2.44*  CALCIUM 7.7* 8.0* 8.5*      Latest Ref Rng & Units 07/20/2023    6:09 AM 07/18/2023   12:50 PM 07/17/2023    2:43 AM  Hepatic Function   Total Protein 6.5 - 8.1 g/dL 5.7  5.4  6.1   Albumin 3.5 - 5.0 g/dL 2.0  2.0  2.3   AST 15 - 41 U/L 31  31  29    ALT 0 - 44 U/L 9  7  8    Alk Phosphatase 38 - 126 U/L 76  71  89   Total Bilirubin 0.3 - 1.2 mg/dL 1.8  1.4  1.2      PT/INR Recent Labs    07/18/23 1250 07/19/23 1446  LABPROT 38.5* 20.0*  INR 3.9* 1.7*    Studies/Results: IR Paracentesis  Result Date: 07/19/2023 INDICATION: Heart failure, hepatic cirrhosis, and recurrent ascites. Request for therapeutic paracentesis. EXAM: ULTRASOUND GUIDED PARACENTESIS MEDICATIONS: 1% lidocaine 10 mL COMPLICATIONS: None immediate. PROCEDURE: Informed written consent was obtained from the patient after a discussion of the risks, benefits and alternatives to treatment. A timeout was performed prior to the initiation of the procedure. Initial ultrasound scanning demonstrates a large amount of ascites within the right lower abdominal quadrant. The right lower abdomen was prepped and draped in the usual sterile fashion. 1% lidocaine was used for local anesthesia. Following this, a 19 gauge, 7-cm, Yueh catheter was introduced. An ultrasound image was saved for  documentation purposes. The paracentesis was performed. The catheter was removed and a dressing was applied. The patient tolerated the procedure well without immediate post procedural complication. FINDINGS: Only 400 mL of blood was removed. The procedure was stopped secondary to patient developing severe hypotension and concern for bleeding into the abdomen. IMPRESSION: Successful ultrasound-guided paracentesis yielding 400 mL of blood. Critical results were discussed with the medicine team at the time of procedure. Procedure performed by: Corrin Parker, PA-C Electronically Signed   By: Malachy Moan M.D.   On: 07/19/2023 11:42       Assessment / Plan:   Assessment: 1.  Cirrhosis: Thought cardiac related, 3 L of fluid removed on 07/17/2023, abdomen is distended, worsened today and  hypertympanic, midodrine and albumin were ordered yesterday given some hypotension, BNP with dramatic increase overnight- paracentesis ordered today 2.  Bicuspid aortic valve status post AVR 3.  Heart failure with preserved EF (EF 45-50%, grade 3 diastolic dysfunction) 4.  A-fib: On Xarelto-currently on hold 5.  CAD status post CABG 6.  CKD 7.  Anemia: Hemoglobin 9.7--> 7.1 overnight but patient does not report any GI bleeding, no black stools, did have a prior CTA showing a rectus abdominis muscle hematoma with active extravasation, repeat CT head showed hematoma decreased in size but there was a pseudoaneurysm within the rectus sheath  Plan: Order repeat ultrasound-guided paracentesis today. Continue PPI and ceftriaxone Continue to monitor for signs of GI bleeding, again may need to consider an EGD Nephrology did follow today.  See their recommendations, overall prognosis is poor from their standpoint not a candidate for dialysis. 5.   Please await further recommendations from Dr. Leonides Schanz later today.  Thank you for your kind consultation, we will continue to follow along.   LOS: 3 days   Unk Lightning  07/21/2023, 10:53 AM

## 2023-07-21 NOTE — Progress Notes (Signed)
Palliative:  HPI: 67 y.o. 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 Hgb from 10.5 at d/c 9/23 to 7.1 9/24 and received 2U PRBCs.   I met today with Mr. Kleinert. He feels "okay" today. We had an honest discussion of his condition and my concerns for worsening kidney function. We discussed that he would not be a good candidate for dialysis and he understands and agrees. We discussed his wishes if he declines further and he would not want to go to ICU and be hooked up to multiple infusions and have invasive care - he understands this will not be helpful to him given the severity of his underlying multi-organ dysfunction. I discussed with Mr. Coxe the potential of having hospice support at home. We discussed that he can opt to go home with hospice at any stage of his illness. He is open to considering. He calls his wife and requests that she come and meet with me to discuss further.   Update: I return to Mr. Capaldi bedside and met with he and his wife Shawna Orleans. We reviewed his current condition and monitoring Hgb and BP. My greatest concern at this time is his kidney function. We discussed the limited options if his kidneys continue to decline. We spent time discussing outpatient palliative care which they have had but this has not been very helpful to them. I expressed understanding that most outpatient palliative programs are very limited with resources and actual assistance they are able to provide to patients. I discussed with them that we are at the point to consider even hospice support at home. I spent time explaining palliative vs hospice  services. They both agree that they wish to have hospice support when he returns home. Mr. Allston does not feel the need to return home immediately. Shawna Orleans reports that she is open to accepting home whenever he decides this is best for him. The express that home after paracentesis if reasonably safe to pursue would be appropriate. Timing of home will depend on progress. I will follow up tomorrow and we will continue to discuss progress and assist Mr. Swanger to align his care with his goals. Shawna Orleans expresses that she has been trying to come to terms and be as accepting as possible with his poor prognosis - she does believe he has been much more accepting of this than she has been. They both very much wish for him to return home with hospice with goal to remain at home.   All questions/concerns addressed. Emotional support provided.   Exam: Alert, oriented. No distress. Generalized discomfort at times. Breathing regular, unlabored - oxygen helps. Abd distended but reportedly less discomfort since BM. Thin, frail.   Plan: - DNR/DNI - Home with hospice when optimized or sooner if continued decline  120 min  Yong Channel, NP Palliative Medicine Team Pager [phone] (Please see [email or web address] for schedule) Team Phone [phone]    Greater than 50%  of this time was spent counseling and coordinating care related to the above assessment and plan

## 2023-07-21 NOTE — Progress Notes (Signed)
PROGRESS NOTE                                                                                                                                                                                                             Patient Demographics:    Frank Berg, is a 67 y.o. male, DOB - 1956-05-14, YQI:[id number]  Outpatient Primary MD for the patient is Adrian Prince, MD    LOS - 3  Admit date - 07/18/2023    Chief Complaint  Patient presents with   Hypotension   Dizziness       Brief Narrative (HPI from H&P)   67 year old male with history of end-stage liver cirrhosis , ischemic cardiomyopathy, cardiac amyloidosis, A-fib on chronic anticoagulation, type 2 diabetes, sleep apnea on CPAP, CKD stage IIIb, recurrent ascites needing paracentesis who was recently hospitalized for GI bleed workup  and was discharged on 07/17/2023 presented with lightheadedness, weakness, syncopal episode.  He was hypotensive on presentation.  Lab work showed sodium of 125, creatinine of 2.1, hemoglobin of 7.1, INR of 3.9.  CTA abdomen did not show any acute bleed but showed intramuscular hematoma of left rectus abdominis which was diagnosed on last admission.  He was seen by GI, PCCM for hypotension, anemia, worsening ascites in the setting of end-stage liver cirrhosis, hyponatremia, possible hepatorenal syndrome.  Transferred to my care on 07/20/2023.   Subjective:   Patient in bed, appears comfortable, denies any headache, no fever, no chest pain or pressure, no shortness of breath , no abdominal pain. No new focal weakness.    Assessment  & Plan :   Acute blood loss anemia due to intra-abdominal muscle hematoma: Patient admitted for GI bleed and was discharged on 07/17/2023.  On his last admission, GI following, EGD was not done, treated with Protonix infusion, octreotide and was discharged on PPI twice daily, Xarelto was resumed for A-fib.  He was  noted to have intramuscular hematoma of rectus abdominis at that time as well.    Patient presented with lightheadedness.  Found to have acute blood loss anemia with hemoglobin in the range of 7.  Transferred with 2 units of PRBC.  This morning hemoglobin stable in the range of 9.  Xarelto on hold.  Patient mentioned seeing darker stool a day before admission.was seen by GI team this admission again, Xarelto was  held, he received vitamin K and Kcentra for Xarelto reversal, on PPI.  Currently CBC has stabilized, continue to monitor on PPI, defer resumption of Xarelto to GI.    End-stage liver cirrhosis likely cardiac cirrhosis versus NASH with recurrent ascites possible hepatorenal syndrome: He had 3 L of fluid removed on 07/17/2023, abdomen is distended with low blood pressure, continue on scheduled midodrine and albumin x 4 doses, will defer to GI if he needs more therapeutic fluid removal on 07/20/2023, options are limited, appreciate input from GI, palliative care and PCCM.    Chronic, systolic/diastolic CHF: Follows with advanced heart failure team.  Last echo showed EF of 45 to 50%, grade 2 diastolic dysfunction, blood pressure too low for ACE ARB/beta-blocker, diuretics as tolerated by blood pressure.   Hyponatremia: Due to massive third spacing of fluid, diuretics as tolerated, paracentesis as tolerated, limited options, Balaji following for hyponatremia and fluid management..   CKD stage IIIb: Creatinine is in the range of 2, which is his baseline.  Avoid nephrotoxins   OSA: On CPAP nightly   Paroxysmal A-fib: Remains in A-fib with controlled rate.  Xarelto on hold due to #1 above.   Coronary artery disease: Status post CABG.  No anginal symptoms.  Follows with cardiology as an outpatient.  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   Goals of care: Multiple comorbidities with very poor prognosis.  CODE STATUS DNR.  Palliative care consulted,  currently DNR/DNI.  Long-term prognosis again appears to be extremely poor.   Type 2 diabetes: Monitor blood sugar.  On Jardiance at home.  Also on sliding scale here.  CBG (last 3)  No results for input(s): "GLUCAP" in the last 72 hours.  Lab Results  Component Value Date   HGBA1C 5.8 (H) 07/15/2023           Condition - Extremely Guarded  Family Communication  :  wife Shawna Orleans  [phone] in detail on 07/20/2023  Code Status :  DNR-DNI  Consults  :  GI, Renal, PCCM palliative care  PUD Prophylaxis :  PPI   Procedures  :      CT - 1. Interval decrease in size of the left periumbilical rectus abdominus hematoma in keeping with partial resolution. 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 Active extravasation, while not completely excluded on this single phase exam, is considered less likely given the interval decrease in size of the rectus sheath hematoma. This could be further assessed with dedicated sonography if indicated. 2. Cirrhotic changes better appreciated on MRI examination 07/15/2023. Known hypoenhancing lesion within the left hepatic lobe is not well appreciated on this examination. 3. Large volume ascites, progressive since prior examination. 4. Small right and trace left pleural effusions. 5. Moderate prostatic hypertrophy. Aortic Atherosclerosis        Disposition Plan  :    Status is: Inpatient  DVT Prophylaxis  :    Place TED hose Start: 07/18/23 1820   Lab Results  Component Value Date   PLT 209 07/21/2023    Diet :  Diet Order             Diet Heart Room service appropriate? Yes; Fluid consistency: Thin  Diet effective now  Inpatient Medications  Scheduled Meds:  allopurinol   300 mg Oral Daily   Chlorhexidine Gluconate Cloth  6 each Topical Daily   midodrine  10 mg Oral TID WC   mirtazapine  15 mg Oral QHS   pantoprazole (PROTONIX) IV  40 mg Intravenous Q12H   polyethylene glycol  17 g Oral Daily   Ensure Max Protein  11 oz Oral QHS   rosuvastatin  5 mg Oral QPM   senna  1 tablet Oral Daily   Tafamidis  1 capsule Oral Daily   tamsulosin  0.4 mg Oral QHS   Continuous Infusions:  sodium chloride Stopped (07/19/23 1209)   albumin human 50 g (07/21/23 0835)   cefTRIAXone (ROCEPHIN)  IV 200 mL/hr at 07/21/23 0800   PRN Meds:.bacitracin, colchicine, zolpidem  Antibiotics  :    Anti-infectives (From admission, onward)    Start     Dose/Rate Route Frequency Ordered Stop   07/18/23 1645  cefTRIAXone (ROCEPHIN) 1 g in sodium chloride 0.9 % 100 mL IVPB        1 g 200 mL/hr over 30 Minutes Intravenous Every 24 hours 07/18/23 1642           Objective:   Vitals:   07/21/23 0200 07/21/23 0400 07/21/23 0500 07/21/23 0800  BP:  (!) 87/71  92/70  Pulse: 78 82  84  Resp: 14 14  18   Temp:    98.3 F (36.8 C)  TempSrc:    Oral  SpO2: 92% 97%  95%  Weight:   77.1 kg   Height:        Wt Readings from Last 3 Encounters:  07/21/23 77.1 kg  07/17/23 74.6 kg  06/12/23 75.8 kg     Intake/Output Summary (Last 24 hours) at 07/21/2023 1022 Last data filed at 07/21/2023 0800 Gross per 24 hour  Intake 1188.18 ml  Output 350 ml  Net 838.18 ml     Physical Exam  Awake Alert, No new F.N deficits, Normal affect [place].AT,PERRAL Supple Neck, No JVD,   Symmetrical Chest wall movement, Good air movement bilaterally, CTAB RRR,No Gallops,Rubs or new Murmurs,  +ve B.Sounds, Abd is distended ++ No Cyanosis, Clubbing or edema       Data Review:    Recent Labs  Lab 07/14/23 1621 07/15/23 0150 07/17/23 0243 07/18/23 1250 07/18/23 1841 07/19/23 0314 07/19/23 1107 07/19/23 1829 07/20/23 0609 07/21/23 0808  WBC 4.5  --  4.6 7.6  --   --   --   --  7.0  7.2  HGB 9.5*   < > 10.5* 7.1*   < > 9.2* 8.9* 9.7* 9.1* 8.5*  HCT 29.3*   < > 32.2* 22.2*   < > 26.9* 26.0* 29.3* 26.2* 25.9*  PLT 339  --  321 242  --   --   --   --  260 209  MCV 89.9  --  88.0 91.0  --   --   --   --  85.1 84.6  MCH 29.1  --  28.7 29.1  --   --   --   --  29.5 27.8  MCHC 32.4  --  32.6 32.0  --   --   --   --  34.7 32.8  RDW 18.7*  --  18.6* 18.2*  --   --   --   --  17.1* 16.7*  LYMPHSABS  --   --  0.5* 0.3*  --   --   --   --   --  0.4*  MONOABS  --   --  0.3 0.4  --   --   --   --   --  0.4  EOSABS  --   --  0.1 0.0  --   --   --   --   --  0.1  BASOSABS  --   --  0.0 0.0  --   --   --   --   --  0.0   < > = values in this interval not displayed.    Recent Labs  Lab 07/14/23 1621 07/14/23 1702 07/15/23 0150 07/17/23 0243 07/18/23 1123 07/18/23 1250 07/19/23 1446 07/20/23 0609 07/20/23 0835 07/21/23 0808  NA 128*  --  128* 128*  --  125*  --  126*  --  125*  K 4.3  --  4.0 5.3*  --  5.0  --  4.7  --  4.2  CL 97*  --  96* 101  --  97*  --  97*  --  95*  CO2 24  --  20* 23  --  19*  --  17*  --  17*  ANIONGAP 7  --  12 4*  --  9  --  12  --  13  GLUCOSE 109*  --  127* 99  --  99  --  74  --  119*  BUN 48*  --  43* 33*  --  42*  --  49*  --  50*  CREATININE 2.23*  --  2.04* 1.89*  --  2.10*  --  2.42*  --  2.44*  AST 43*  --  37 29  --  31  --  31  --   --   ALT 11  --  10 8  --  7  --  9  --   --   ALKPHOS 121  --  89 89  --  71  --  76  --   --   BILITOT 1.5*  --  1.4* 1.2  --  1.4*  --  1.8*  --   --   ALBUMIN 2.7*  --  2.7* 2.3*  --  2.0*  --  2.0*  --   --   CRP  --   --   --   --   --   --   --   --   --  20.5*  PROCALCITON  --   --   --   --   --   --   --  4.80  --  4.60  LATICACIDVEN  --  1.5  --   --   --   --   --   --   --   --   INR 2.1*  --  1.9*  --   --  3.9* 1.7*  --   --   --   TSH  --   --   --   --   --   --   --  5.121*  --  5.633*  HGBA1C  --   --  5.8*  --   --   --   --   --   --   --   AMMONIA  --   --  21  --   --   --   --    --   --   --   BNP  --   --   --   --  484.6*  --   --   --  527.3* 1,353.8*  MG  --   --   --  2.3  --   --   --  2.1  --  2.4  CALCIUM 8.2*  --  8.1* 8.1*  --  7.7*  --  8.0*  --  8.5*      Recent Labs  Lab 07/14/23 1621 07/14/23 1702 07/15/23 0150 07/17/23 0243 07/18/23 1123 07/18/23 1250 07/19/23 1446 07/20/23 0609 07/20/23 0835 07/21/23 0808  CRP  --   --   --   --   --   --   --   --   --  20.5*  PROCALCITON  --   --   --   --   --   --   --  4.80  --  4.60  LATICACIDVEN  --  1.5  --   --   --   --   --   --   --   --   INR 2.1*  --  1.9*  --   --  3.9* 1.7*  --   --   --   TSH  --   --   --   --   --   --   --  5.121*  --  5.633*  HGBA1C  --   --  5.8*  --   --   --   --   --   --   --   AMMONIA  --   --  21  --   --   --   --   --   --   --   BNP  --   --   --   --  484.6*  --   --   --  527.3* 1,353.8*  MG  --   --   --  2.3  --   --   --  2.1  --  2.4  CALCIUM 8.2*  --  8.1* 8.1*  --  7.7*  --  8.0*  --  8.5*    --------------------------------------------------------------------------------------------------------------- No results found for: "CHOL", "HDL", "LDLCALC", "LDLDIRECT", "TRIG", "CHOLHDL"  Lab Results  Component Value Date   HGBA1C 5.8 (H) 07/15/2023    Radiology Reports IR Paracentesis  Result Date: 07/19/2023 INDICATION: Heart failure, hepatic cirrhosis, and recurrent ascites. Request for therapeutic paracentesis. EXAM: ULTRASOUND GUIDED PARACENTESIS MEDICATIONS: 1% lidocaine 10 mL COMPLICATIONS: None immediate. PROCEDURE: Informed written consent was obtained from the patient after a discussion of the risks, benefits and alternatives to treatment. A timeout was performed prior to the initiation of the procedure. Initial ultrasound scanning demonstrates a large amount of ascites within the right lower abdominal quadrant. The right lower abdomen was prepped and draped in the usual sterile fashion. 1% lidocaine was used for local anesthesia. Following  this, a 19 gauge, 7-cm, Yueh catheter was introduced. An ultrasound image was saved for documentation purposes. The paracentesis was performed. The catheter was removed and a dressing was applied. The patient tolerated the procedure well without immediate post procedural complication. FINDINGS: Only 400 mL of blood was removed. The procedure was stopped secondary to patient developing severe hypotension and concern for bleeding into the abdomen. IMPRESSION: Successful ultrasound-guided paracentesis yielding 400 mL of blood. Critical results were discussed with the medicine team at the time of procedure. Procedure performed by: Corrin Parker, PA-C Electronically Signed   By: Malachy Moan M.D.   On: 07/19/2023 11:42   CT  ABDOMEN PELVIS W CONTRAST  Result Date: 07/18/2023 CLINICAL DATA:  Anemia, abdominal wall hematoma EXAM: CT ABDOMEN AND PELVIS WITH CONTRAST TECHNIQUE: Multidetector CT imaging of the abdomen and pelvis was performed using the standard protocol following bolus administration of intravenous contrast. RADIATION DOSE REDUCTION: This exam was performed according to the departmental dose-optimization program which includes automated exposure control, adjustment of the mA and/or kV according to patient size and/or use of iterative reconstruction technique. CONTRAST:  60mL OMNIPAQUE IOHEXOL 350 MG/ML SOLN COMPARISON:  07/14/2023 FINDINGS: Lower chest: Small right and trace left pleural effusions are present. Bibasilar pulmonary parenchymal scarring. Median sternotomy has been performed. No acute abnormality. Hepatobiliary: Cirrhotic changes better appreciated on MRI examination 07/15/2023. Known hypoenhancing lesion within the left hepatic lobe is not well appreciated on this examination. No gallstones, gallbladder wall thickening, or biliary dilatation. Pancreas: Unremarkable Spleen: Unremarkable Adrenals/Urinary Tract: The adrenal glands are unremarkable. The kidneys are normal in size and position.  Simple cortical cyst noted within the interpolar region of the right kidney for which no follow-up imaging is recommended. The kidneys are otherwise unremarkable. Foley catheter balloon seen within a decompressed bladder lumen. Stomach/Bowel: Large volume ascites, progressive since prior examination. Stomach, small bowel, and large bowel are unremarkable. No free intraperitoneal gas. Vascular/Lymphatic: Extensive aortoiliac atherosclerotic calcification. No aortic aneurysm. No pathologic adenopathy within the abdomen and pelvis. Reproductive: Moderate prostatic hypertrophy. Other: Intramuscular hematoma within the left periumbilical rectus abdominus musculature has decreased in size in keeping with partial resolution. 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 Active extravasation, while not completely excluded on this single phase exam, is considered less likely given the interval decrease in size of the rectus sheath hematoma. Musculoskeletal: No acute bone abnormality. No lytic or blastic bone lesion. Osseous structures are age-appropriate. IMPRESSION: 1. Interval decrease in size of the left periumbilical rectus abdominus hematoma in keeping with partial resolution. 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 Active extravasation, while not completely excluded on this single phase exam, is considered less likely given the interval decrease in size of the rectus sheath hematoma. This could be further assessed with dedicated  sonography if indicated. 2. Cirrhotic changes better appreciated on MRI examination 07/15/2023. Known hypoenhancing lesion within the left hepatic lobe is not well appreciated on this examination. 3. Large volume ascites, progressive since prior examination. 4. Small right and trace left pleural effusions. 5. Moderate prostatic hypertrophy. Aortic Atherosclerosis (ICD10-I70.0). Electronically Signed   By: Helyn Numbers M.D.   On: 07/18/2023 18:45   IR Paracentesis  Result Date: 07/17/2023 INDICATION: 67 year old male. Inpatient request. History of CHF with recurrent ascites. Request is for therapeutic paracentesis EXAM: ULTRASOUND GUIDED THERAPEUTIC PARACENTESIS MEDICATIONS: Lidocaine 1% 10 mL COMPLICATIONS: None immediate. PROCEDURE: Informed written consent was obtained from the patient after a discussion of the risks, benefits and alternatives to treatment. A timeout was performed prior to the initiation of the procedure. Initial ultrasound scanning demonstrates a small amount of ascites within the right lower abdominal quadrant. The right lower abdomen was prepped and draped in the usual sterile fashion. 1% lidocaine was used for local anesthesia. Following this,  a 19 gauge, 7-cm, Yueh catheter was introduced. An ultrasound image was saved for documentation purposes. The paracentesis was performed. The catheter was removed and a dressing was applied. The patient tolerated the procedure well without immediate post procedural complication. FINDINGS: A total of approximately 900 mL of serosanguineous fluid was removed. IMPRESSION: Successful ultrasound-guided therapeutic LEFT paracentesis yielding 900 mL of peritoneal fluid. Performed by Anders Grant NP Electronically Signed   By: Roanna Banning M.D.   On: 07/17/2023 14:43      Signature  -   Susa Raring M.D on 07/21/2023 at 10:22 AM   -  To page go to [email or web address]

## 2023-07-21 NOTE — Care Management Important Message (Signed)
Important Message  Patient Details  Name: Frank Berg MRN: [id number] Date of Birth: [birth year]   Important Message Given:  Yes - Medicare IM     Dorena Bodo 07/21/2023, 3:29 PM

## 2023-07-21 NOTE — Progress Notes (Signed)
[facility] Resident Progress note  Assessment/plan:  67 year old male with cardiac amyloidosis with grade III diastolic dysfunction, ischemic cardiomyopathy with LVEF 45-50% and moderately reduced RV function, severe tricuspid regurgitation, anticoagulated for atrial fibrillation, hepatic cirrhosis with ascites, and CKD 3B, admitted for acute blood loss anemia due to left rectus abdominis hematoma and hemoperitoneum after paracentesis, now with worsening renal function.  AKI on CKD 3B Presumably from ischemic ATN in setting of acute blood loss anemia with hypotension while on SGLT2 inhibitor and torsemide.  Also query hepatorenal syndrome given recurrent ascites, climbing T. bili and increasing frequency of paracentesis over the last few months.  I think cardiorenal syndrome is less likely, his dyspnea on exertion was stable prior to admission and I think his volume overload is driven more by portal venous hypertension rather than right heart failure.  The UA shows hematuria and pyuria, but this is a catheterized specimen.  Still need urine sodium, but I favor continuing albumin and midodrine, holding torsemide and SGLT2 inhibitor, and watching for renal recovery.  Could add octreotide if hepatorenal syndrome is an ongoing concern.  We discussed that if he progresses to renal failure, this would not be a survivable condition.  Avoid nephrotoxic medications including NSAIDs and iodinated intravenous contrast exposure unless the latter is absolutely indicated.  Preferred narcotic agents for pain control are hydromorphone, fentanyl, and methadone. Morphine should not be used. Avoid Baclofen and avoid oral sodium phosphate and magnesium citrate based laxatives / bowel preps. Continue strict Input and Output monitoring.   Hyponatremia Hypervolemic hyponatremia as per history of heart failure and cirrhosis.  Optimize renal function before restarting diuretics and SGLT2  inhibitor.  Decompensated cirrhosis Presumably cardiac cirrhosis, with ascites and AKI.  Empiric ceftriaxone.  GI following.  Limit paracentesis to 4 L and infuse albumin during procedure if needed again.  Acute blood loss anemia Due to rectus abdominis hematoma and hemoperitoneum.  Anticoagulation for A-fib held.  PPI on board in case of intraluminal GI bleeding.  Chronic CHF From cardiac amyloidosis.  With restrictive cardiomyopathy, mildly depressed LV function, and moderately depressed RV function with severe tricuspid regurgitation.  Do not suspect decompensation from this at present but could consider repeat echo as last was done in February 2023.  Tafamidis is continued for amyloidosis.  Subjective:  No new concerns since yesterday.  Breathing feels okay.  Abdomen does not feel especially tense.  Denies abdominal pain.  No heart palpitations.  Was getting pretty frequent paracentesis starting 3 to 6 months ago.  Prior to this he had never had one.  Has dyspnea on exertion at baseline, but this has been stable, perhaps even improved with antiamyloid drugs.  Scheduled Meds:  allopurinol  300 mg Oral Daily   Chlorhexidine Gluconate Cloth  6 each Topical Daily   midodrine  10 mg Oral TID WC   mirtazapine  15 mg Oral QHS   pantoprazole (PROTONIX) IV  40 mg Intravenous Q12H   polyethylene glycol  17 g Oral Daily   Ensure Max Protein  11 oz Oral QHS   rosuvastatin  5 mg Oral QPM   senna  1 tablet Oral Daily   Tafamidis  1 capsule Oral Daily   tamsulosin  0.4 mg Oral QHS   Continuous Infusions:  sodium chloride Stopped (07/19/23 1209)   albumin human 50 g (07/21/23 0835)   cefTRIAXone (ROCEPHIN)  IV 200 mL/hr at 07/21/23 0800   PRN Meds:.bacitracin, colchicine, zolpidem  Objective:  BP 92/70 (BP Location: Left  Arm)   Pulse 84   Temp 98.3 F (36.8 C) (Oral)   Resp 18   Ht 6\' 2"  (1.88 m)   Wt 77.1 kg   SpO2 95%   BMI 21.82 kg/m   Cachectic appearing, no distress Heart  rate is normal, rhythm is regular, occasional PVC on monitor, bilateral lower extremity edema tracking up further since yesterday, I do not appreciate elevated JVP of RIJ, distant heart sounds Breathing comfortably on nasal cannula, lungs are clear Skin is warm and dry, no appreciable rashes on exposed areas Abdomen is distended but soft with fluid wave, nontender Alert and oriented     Latest Ref Rng & Units 07/21/2023    8:08 AM 07/20/2023    6:09 AM 07/18/2023   12:50 PM  CMP  Glucose 70 - 99 mg/dL 161  74  99   BUN 8 - 23 mg/dL 50  49  42   Creatinine 0.61 - 1.24 mg/dL 0.96  0.45  4.09   Sodium 135 - 145 mmol/L 125  126  125   Potassium 3.5 - 5.1 mmol/L 4.2  4.7  5.0   Chloride 98 - 111 mmol/L 95  97  97   CO2 22 - 32 mmol/L 17  17  19    Calcium 8.9 - 10.3 mg/dL 8.5  8.0  7.7   Total Protein 6.5 - 8.1 g/dL  5.7  5.4   Total Bilirubin 0.3 - 1.2 mg/dL  1.8  1.4   Alkaline Phos 38 - 126 U/L  76  71   AST 15 - 41 U/L  31  31   ALT 0 - 44 U/L  9  7       Latest Ref Rng & Units 07/21/2023    8:08 AM 07/20/2023    6:09 AM 07/19/2023    6:29 PM  CBC  WBC 4.0 - 10.5 K/uL 7.2  7.0    Hemoglobin 13.0 - 17.0 g/dL 8.5  9.1  9.7   Hematocrit 39.0 - 52.0 % 25.9  26.2  29.3   Platelets 150 - 400 K/uL 209  260     Urinalysis    Component Value Date/Time   COLORURINE AMBER (A) 07/20/2023 1838   APPEARANCEUR CLOUDY (A) 07/20/2023 1838   LABSPEC 1.011 07/20/2023 1838   PHURINE 5.0 07/20/2023 1838   GLUCOSEU >=500 (A) 07/20/2023 1838   HGBUR MODERATE (A) 07/20/2023 1838   BILIRUBINUR NEGATIVE 07/20/2023 1838   KETONESUR NEGATIVE 07/20/2023 1838   PROTEINUR NEGATIVE 07/20/2023 1838   NITRITE NEGATIVE 07/20/2023 1838   LEUKOCYTESUR LARGE (A) 07/20/2023 1838

## 2023-07-22 ENCOUNTER — Other Ambulatory Visit (HOSPITAL_COMMUNITY): Payer: Self-pay

## 2023-07-22 DIAGNOSIS — N179 Acute kidney failure, unspecified: Secondary | ICD-10-CM | POA: Diagnosis not present

## 2023-07-22 DIAGNOSIS — Z7189 Other specified counseling: Secondary | ICD-10-CM | POA: Diagnosis not present

## 2023-07-22 DIAGNOSIS — I5042 Chronic combined systolic (congestive) and diastolic (congestive) heart failure: Secondary | ICD-10-CM | POA: Diagnosis not present

## 2023-07-22 DIAGNOSIS — E854 Organ-limited amyloidosis: Secondary | ICD-10-CM | POA: Insufficient documentation

## 2023-07-22 DIAGNOSIS — K7460 Unspecified cirrhosis of liver: Secondary | ICD-10-CM | POA: Diagnosis not present

## 2023-07-22 DIAGNOSIS — Z515 Encounter for palliative care: Secondary | ICD-10-CM | POA: Diagnosis not present

## 2023-07-22 LAB — CBC WITH DIFFERENTIAL/PLATELET
Abs Immature Granulocytes: 0.04 10*3/uL (ref 0.00–0.07)
Basophils Absolute: 0 10*3/uL (ref 0.0–0.1)
Basophils Relative: 0 %
Eosinophils Absolute: 0 10*3/uL (ref 0.0–0.5)
Eosinophils Relative: 1 %
HCT: 23 % — ABNORMAL LOW (ref 39.0–52.0)
Hemoglobin: 7.7 g/dL — ABNORMAL LOW (ref 13.0–17.0)
Immature Granulocytes: 1 %
Lymphocytes Relative: 6 %
Lymphs Abs: 0.4 10*3/uL — ABNORMAL LOW (ref 0.7–4.0)
MCH: 28.2 pg (ref 26.0–34.0)
MCHC: 33.5 g/dL (ref 30.0–36.0)
MCV: 84.2 fL (ref 80.0–100.0)
Monocytes Absolute: 0.5 10*3/uL (ref 0.1–1.0)
Monocytes Relative: 7 %
Neutro Abs: 6 10*3/uL (ref 1.7–7.7)
Neutrophils Relative %: 85 %
Platelets: 171 10*3/uL (ref 150–400)
RBC: 2.73 MIL/uL — ABNORMAL LOW (ref 4.22–5.81)
RDW: 16.7 % — ABNORMAL HIGH (ref 11.5–15.5)
WBC: 7 10*3/uL (ref 4.0–10.5)
nRBC: 0 % (ref 0.0–0.2)

## 2023-07-22 LAB — BASIC METABOLIC PANEL
Anion gap: 14 (ref 5–15)
BUN: 49 mg/dL — ABNORMAL HIGH (ref 8–23)
CO2: 18 mmol/L — ABNORMAL LOW (ref 22–32)
Calcium: 8.8 mg/dL — ABNORMAL LOW (ref 8.9–10.3)
Chloride: 95 mmol/L — ABNORMAL LOW (ref 98–111)
Creatinine, Ser: 2.43 mg/dL — ABNORMAL HIGH (ref 0.61–1.24)
GFR, Estimated: 28 mL/min — ABNORMAL LOW (ref >60)
Glucose, Bld: 106 mg/dL — ABNORMAL HIGH (ref 70–99)
Potassium: 4.1 mmol/L (ref 3.5–5.1)
Sodium: 127 mmol/L — ABNORMAL LOW (ref 135–145)

## 2023-07-22 LAB — BRAIN NATRIURETIC PEPTIDE: B Natriuretic Peptide: 2146.3 pg/mL — ABNORMAL HIGH (ref 0.0–100.0)

## 2023-07-22 LAB — MAGNESIUM: Magnesium: 2.2 mg/dL (ref 1.7–2.4)

## 2023-07-22 LAB — C-REACTIVE PROTEIN: CRP: 17.8 mg/dL — ABNORMAL HIGH (ref <1.0)

## 2023-07-22 LAB — PROCALCITONIN: Procalcitonin: 3.38 ng/mL

## 2023-07-22 MED ORDER — ZOLPIDEM TARTRATE 5 MG PO TABS
5.0000 mg | ORAL_TABLET | Freq: Every evening | ORAL | 1 refills | Status: DC | PRN
  Filled 2023-07-22: qty 30, 30d supply, fill #0

## 2023-07-22 NOTE — Procedures (Signed)
[facility] POLYSOMNOGRAPHY  INTERPRETATION REPORT   STUDY DATE:  07/09/2023     PATIENT NAME:  Frank Berg         DATE OF BIRTH:  [birth year]  PATIENT ID:  [id number]    TYPE OF STUDY:  PSG  READING PHYSICIAN: Melvyn Novas, MD REFERRED BY: Ursula Alert, NP PCP :  Dr Evlyn Kanner Neurologist : DR Hampton Abbot Cardiologist: Dr Milas Kocher  SCORING TECHNICIAN: Domingo Cocking, RPSGT   HISTORY:  Frank Berg is 67 year-old Male patient on CPAP who feels that his PAP machine is not longer providing enough pressure. This patient was seen by Lamont Dowdy, NP, who ordered this study as a SPLIT on 06-12-2023. There was not enough sleep time to split this study-  New machine needed by date of issue. This patient's medication list includes Ambien.  This patient carries a dx of amyloid neuropathy, followed by specialist clinic at [facility] [place], Neuromuscular division. 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 Gala Romney) for cardiomyopathy. Remains on Tafamidis  ADDITIONAL INFORMATION:  The Epworth Sleepiness Scale endorsed at 4 /24 points (scores above or equal to 10 are suggestive of hypersomnolence). Height: 74 in Weight: 167 lb (BMI 21) Neck Size: 0 in   TECHNICAL DESCRIPTION: A registered sleep technologist ( RPSGT)  was in attendance for the duration of the recording.  Data collection, scoring, video monitoring, and reporting were performed in compliance with the AASM Manual for the Scoring of Sleep and Associated Events; (Hypopnea is scored based on the criteria listed in Section VIII D. 1b in the AASM Manual V2.6 using a 4% oxygen desaturation rule or Hypopnea is  scored based on the criteria listed in Section VIII D. 1a in the AASM Manual V2.6 using 3% oxygen desaturation and /or arousal rule).   SLEEP CONTINUITY AND SLEEP ARCHITECTURE:  Lights-out was at 22:11: and lights-on at  04:52:, with  6.7 hours of recording time . Total sleep time ( TST) was 89.5 minutes with a decreased sleep efficiency at 22.3%.  BODY POSITION:  TST was 100.0% supine. Sleep latency was increased at 63.0 minutes.  REM sleep latency was decreased at 0.0 minutes. Of the total sleep time, the percentage of stage N1 sleep was 58.7%, stage N2 sleep was 41%, stage N3 sleep was 0.0%, and REM sleep was 0.0%.   RESPIRATORY MONITORING:  Based on CMS criteria (using a 4% oxygen desaturation rule for scoring hypopneas), there were 66 apneas (0 obstructive; 50 central; 16 mixed), and 3 hypopneas.  Apnea index was 44.2. Hypopnea index was 2.0. The apnea-hypopnea index was 46.3 overall (46.3 supine, 0 non-supine; 0.0 REM, 0.0 supine REM). OXIMETRY: Oxyhemoglobin Saturation Nadir during sleep was at  90%) from a mean of 99%.  Of the Total sleep time (TST)  hypoxemia (=<88%) was present for  0.0 minutes, or 0.0% of total sleep time.  LIMB MOVEMENTS: There were 4 periodic limb movements of sleep (2.7/hr), of which 1 (0.7/hr) were associated with an arousal. AROUSAL: There were 69 arousals in total, for an arousal index of 46 arousals/hour.  Of these, 51 were identified as respiratory-related arousals (34 /h), 1 were PLM-related arousals (1 /h), and 37 were non-specific arousals (25 /h). EEG:  PSG EEG was of normal amplitude and frequency,  with symmetric manifestation of sleep stages. EKG: The electrocardiogram documented irregular shaped QRS complexes and R to R intervals. AUDIO and VIDEO:   IMPRESSION: CENTRAL Sleep disordered breathing was present. The patient was unable to sleep (in spite of Ambien?)  This study was ordered as a PSG, so a titration was not attempted.  The patient will likely need  at least BiPAP ST, and most likely ASV with this proportion of central apnea.  I ordered a return study (in-lab) for this special titration. The patient will need to bring his sleep aid with him as should all sleep patients.     Total sleep time was reduced at 89.5 minutes.  Sleep efficiency was decreased at 22.3%.    RECOMMENDATIONS: Melvyn Novas, MD           General Information  Name: Frank Berg, Frank Berg BMI: 21.44 Physician: Melvyn Novas, MD  ID: [id number] Height: 74.0 in Technician: Domingo Cocking, RPSGT  Sex: Male Weight: 167.0 lb Record: [id number]  Age: 19 [09-Mar-1956] Date: 07/09/2023    Medical & Medication History per Butch Penny, NP     Frank Berg is a 67 year old male with a history of obstructive sleep apnea on CPAP. He reports that he has been having shortness of breath. He is seeing cardiologist for heart failure. He reports that they are adjusting his medications. He states that night he feels that he is not getting enough pressure when he puts the mask on. Wife reports that he does need a new machine. Zyloprim, colchicine, Jardiance, Ensure Max Protein, iron, Claritin, Proamatine, Remeron, Multivitamins w/minerals, Xarelto, Crestor, Adcirca, Vyndamax, Demadex, Ambien   Sleep Disorder      Comments   Patient arrived for a diagnostic polysomnogram. Procedure explained and all questions answered. Standard paste setup without complications. Patient sleep efficiency was greatly reduced at 22.32%. After 2.5 hours recording time, patient's total sleep time = 22 fragmented minutes. Patient slept supine. Mild snoring heard. Respiratory events observed, many central events as well as mixed events. Cardiac arrhythmias observed. Patient has a known cardiac history. No significant PLMS observed. No restroom visits for nocturia, as patient is using a [place] catheter.     Lights out: 10:11:23 PM Lights on: 04:52:25 AM   Time Total Supine Side Prone Upright  Recording  (TRT) 6h 41.37m 6h 33.60m 0h 7.47m 0h 0.35m 0h 0.42m  Sleep (TST) 1h 29.75m 1h 29.69m 0h 0.44m 0h 0.54m 0h 0.20m   Latency N1 N2 N3 REM Onset Per. Slp. Eff.  Actual 1h 3.52m 2h 2.36m 0h 0.72m 0h 0.25m 1h 3.5m 2h 3.17m 22.32%   Stg Dur Wake N1 N2 N3 REM  Total 232.0 52.5 37.0 0.0 0.0  Supine 232.0 52.5 37.0 0.0 0.0  Side 0.0 0.0 0.0 0.0 0.0  Prone 0.0 0.0 0.0 0.0 0.0  Upright 0.0 0.0 0.0 0.0 0.0   Stg % Wake N1 N2 N3 REM  Total 72.2 58.7 41.3 0.0 0.0  Supine 72.2 58.7 41.3 0.0 0.0  Side 0.0 0.0 0.0 0.0 0.0  Prone 0.0 0.0 0.0 0.0 0.0  Upright 0.0 0.0 0.0 0.0 0.0     Apnea Summary Sub Supine Side Prone Upright  Total 66 Total 66 66 0 0 0    REM 0 0 0 0 0    NREM 66 66 0 0 0  Obs 0 REM 0 0 0 0 0    NREM 0 0 0 0 0  Mix 16 REM 0 0 0 0 0    NREM 16 16  [facility] POLYSOMNOGRAPHY  INTERPRETATION REPORT   STUDY DATE:  07/09/2023     PATIENT NAME:  Frank Berg         DATE OF BIRTH:  [birth year]  PATIENT ID:  [id number]    TYPE OF STUDY:  PSG  READING PHYSICIAN: Melvyn Novas, MD REFERRED BY: Ursula Alert, NP PCP :  Dr Evlyn Kanner Neurologist : DR Hampton Abbot Cardiologist: Dr Milas Kocher  SCORING TECHNICIAN: Domingo Cocking, RPSGT   HISTORY:  Frank Berg is 67 year-old Male patient on CPAP who feels that his PAP machine is not longer providing enough pressure. This patient was seen by Lamont Dowdy, NP, who ordered this study as a SPLIT on 06-12-2023. There was not enough sleep time to split this study-  New machine needed by date of issue. This patient's medication list includes Ambien.  This patient carries a dx of amyloid neuropathy, followed by specialist clinic at [facility] [place], Neuromuscular division. 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 Gala Romney) for cardiomyopathy. Remains on Tafamidis  ADDITIONAL INFORMATION:  The Epworth Sleepiness Scale endorsed at 4 /24 points (scores above or equal to 10 are suggestive of hypersomnolence). Height: 74 in Weight: 167 lb (BMI 21) Neck Size: 0 in   TECHNICAL DESCRIPTION: A registered sleep technologist ( RPSGT)  was in attendance for the duration of the recording.  Data collection, scoring, video monitoring, and reporting were performed in compliance with the AASM Manual for the Scoring of Sleep and Associated Events; (Hypopnea is scored based on the criteria listed in Section VIII D. 1b in the AASM Manual V2.6 using a 4% oxygen desaturation rule or Hypopnea is  scored based on the criteria listed in Section VIII D. 1a in the AASM Manual V2.6 using 3% oxygen desaturation and /or arousal rule).   SLEEP CONTINUITY AND SLEEP ARCHITECTURE:  Lights-out was at 22:11: and lights-on at  04:52:, with  6.7 hours of recording time . Total sleep time ( TST) was 89.5 minutes with a decreased sleep efficiency at 22.3%.  BODY POSITION:  TST was 100.0% supine. Sleep latency was increased at 63.0 minutes.  REM sleep latency was decreased at 0.0 minutes. Of the total sleep time, the percentage of stage N1 sleep was 58.7%, stage N2 sleep was 41%, stage N3 sleep was 0.0%, and REM sleep was 0.0%.   RESPIRATORY MONITORING:  Based on CMS criteria (using a 4% oxygen desaturation rule for scoring hypopneas), there were 66 apneas (0 obstructive; 50 central; 16 mixed), and 3 hypopneas.  Apnea index was 44.2. Hypopnea index was 2.0. The apnea-hypopnea index was 46.3 overall (46.3 supine, 0 non-supine; 0.0 REM, 0.0 supine REM). OXIMETRY: Oxyhemoglobin Saturation Nadir during sleep was at  90%) from a mean of 99%.  Of the Total sleep time (TST)  hypoxemia (=<88%) was present for  0.0 minutes, or 0.0% of total sleep time.  LIMB MOVEMENTS: There were 4 periodic limb movements of sleep (2.7/hr), of which 1 (0.7/hr) were associated with an arousal. AROUSAL: There were 69 arousals in total, for an arousal index of 46 arousals/hour.  Of these, 51 were identified as respiratory-related arousals (34 /h), 1 were PLM-related arousals (1 /h), and 37 were non-specific arousals (25 /h). EEG:  PSG EEG was of normal amplitude and frequency,  with symmetric manifestation of sleep stages. EKG: The electrocardiogram documented irregular shaped QRS complexes and R to R intervals. AUDIO and VIDEO:   IMPRESSION: CENTRAL Sleep disordered breathing was present. The patient was unable to sleep (in spite of Ambien?)  This study was ordered as a PSG, so a titration was not attempted.  The patient will likely need  at least BiPAP ST, and most likely ASV with this proportion of central apnea.  I ordered a return study (in-lab) for this special titration. The patient will need to bring his sleep aid with him as should all sleep patients.     Total sleep time was reduced at 89.5 minutes.  Sleep efficiency was decreased at 22.3%.    RECOMMENDATIONS: Melvyn Novas, MD           General Information  Name: Frank Berg, Frank Berg BMI: 21.44 Physician: Melvyn Novas, MD  ID: [id number] Height: 74.0 in Technician: Domingo Cocking, RPSGT  Sex: Male Weight: 167.0 lb Record: [id number]  Age: 19 [09-Mar-1956] Date: 07/09/2023    Medical & Medication History per Butch Penny, NP     Frank Berg is a 67 year old male with a history of obstructive sleep apnea on CPAP. He reports that he has been having shortness of breath. He is seeing cardiologist for heart failure. He reports that they are adjusting his medications. He states that night he feels that he is not getting enough pressure when he puts the mask on. Wife reports that he does need a new machine. Zyloprim, colchicine, Jardiance, Ensure Max Protein, iron, Claritin, Proamatine, Remeron, Multivitamins w/minerals, Xarelto, Crestor, Adcirca, Vyndamax, Demadex, Ambien   Sleep Disorder      Comments   Patient arrived for a diagnostic polysomnogram. Procedure explained and all questions answered. Standard paste setup without complications. Patient sleep efficiency was greatly reduced at 22.32%. After 2.5 hours recording time, patient's total sleep time = 22 fragmented minutes. Patient slept supine. Mild snoring heard. Respiratory events observed, many central events as well as mixed events. Cardiac arrhythmias observed. Patient has a known cardiac history. No significant PLMS observed. No restroom visits for nocturia, as patient is using a [place] catheter.     Lights out: 10:11:23 PM Lights on: 04:52:25 AM   Time Total Supine Side Prone Upright  Recording  (TRT) 6h 41.37m 6h 33.60m 0h 7.47m 0h 0.35m 0h 0.42m  Sleep (TST) 1h 29.75m 1h 29.69m 0h 0.44m 0h 0.54m 0h 0.20m   Latency N1 N2 N3 REM Onset Per. Slp. Eff.  Actual 1h 3.52m 2h 2.36m 0h 0.72m 0h 0.25m 1h 3.5m 2h 3.17m 22.32%   Stg Dur Wake N1 N2 N3 REM  Total 232.0 52.5 37.0 0.0 0.0  Supine 232.0 52.5 37.0 0.0 0.0  Side 0.0 0.0 0.0 0.0 0.0  Prone 0.0 0.0 0.0 0.0 0.0  Upright 0.0 0.0 0.0 0.0 0.0   Stg % Wake N1 N2 N3 REM  Total 72.2 58.7 41.3 0.0 0.0  Supine 72.2 58.7 41.3 0.0 0.0  Side 0.0 0.0 0.0 0.0 0.0  Prone 0.0 0.0 0.0 0.0 0.0  Upright 0.0 0.0 0.0 0.0 0.0     Apnea Summary Sub Supine Side Prone Upright  Total 66 Total 66 66 0 0 0    REM 0 0 0 0 0    NREM 66 66 0 0 0  Obs 0 REM 0 0 0 0 0    NREM 0 0 0 0 0  Mix 16 REM 0 0 0 0 0    NREM 16 16

## 2023-07-22 NOTE — Progress Notes (Signed)
Palliative:  HPI: 67 y.o. 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 Hgb from 10.5 at d/c 9/23 to 7.1 9/24 and received 2U PRBCs.   I met today with Frank Berg. He is up in recliner. He has no complaints. He talks of discharging home soon. I reviewed with him plan for paracentesis to assist with his comfort while we set up hospice at home so he will have what he needs when he returns home. Frank Berg agrees with plan. Kidney function is not necessarily improving but not really worsening although urine output is good. I anticipate he will need blood transfusion prior to discharge as well. He agrees with getting hospice involved so everything is ready when he is discharged.   I called and reviewed with wife, Frank Berg. She will talk with Frank Berg when she arrives to the hospital to clarify which hospice agency they wish to work with. CSW is involved and assisting to arrange.   All questions/concerns addressed. Emotional support provided.   Exam: Alert, oriented. No distress. Generalized discomfort at times. Breathing regular, unlabored - oxygen helps. Abd distended but reportedly less discomfort since BM. Thin, frail.   Plan: - DNR/DNI - Home with hospice in the next couple days - Paracentesis prior to discharge  40 min  Yong Channel, NP Palliative Medicine Team Pager [phone] (Please see [email or web address] for schedule) Team Phone [phone]    Greater than 50%  of this time was spent counseling and coordinating care related to the above assessment and plan

## 2023-07-22 NOTE — Progress Notes (Signed)
Brief Nephrology Note  Awaiting lab return but urine output much better today. Sounds like patient will try to get paracentesis and then go home on hospice. Agree this is best given prognosis. Not a dialysis candidate which has been discussed with the patient.  We will sign off but don't hesitate to contact us if further help is needed.

## 2023-07-22 NOTE — Progress Notes (Signed)
   07/22/23 2122  BiPAP/CPAP/SIPAP  $ Non-Invasive Home Ventilator  Subsequent  BiPAP/CPAP/SIPAP Pt Type Adult  BiPAP/CPAP/SIPAP Resmed  Mask Type Full face mask  FiO2 (%) 21 %  Patient Home Equipment Yes  Safety Check Completed by RT for Home Unit Yes, no issues noted  BiPAP/CPAP /SiPAP Vitals  SpO2 95 %  Bilateral Breath Sounds Clear;Diminished

## 2023-07-22 NOTE — TOC CM/SW Note (Signed)
CSW was contacted by the provider noting the pt will need hoe hospice, This writer contacted the pt's wife Frank Berg [phone]) and spoke with her about the pt's current disposition. The wife noted she and the patient have not narrowed down as of yet, regarding a home hospice provider.   CSW reviewed with the wife, [facility] services and reviewed with her the rep Marshall Cork [phone]. The wife reported she will be visiting the hospital at 3:30 PM and after speaking with the patient, a decision will be made. The pt's wife noted she would like for the hospice representative to contact her between 4:00 PM and 4:30 PM. This Clinical research associate updated the hospice representative, which the number noted above was provided for the wife to contact the representative due to not being able to meet with the pt and wife at the bedside when the wife arrives.   This Clinical research associate called the pt's wife, and provide her with the hospice contact information above.

## 2023-07-22 NOTE — Progress Notes (Signed)
[facility] Liaison Note  Received request from [place], Transitions of Care Manager, for hospice services at home after discharge. Spoke with Shawna Orleans, spouse to initiate education related to hospice philosophy, services, and team approach to care.  Wife would like for an [facility] Liaison to meet with patient and herself tomorrow after lunch. Will contact wife in the morning to schedule liaison meeting.    [facility] information and contact numbers given to [facility]. Above information shared with Debby Bud, Transitions of Care Manager. Please call with any questions or concerns.  Thank you for the opportunity to participate in this patient's care.   Glenna Fellows BSN, Charity fundraiser, OCN [employer] [phone]

## 2023-07-22 NOTE — Progress Notes (Signed)
Subjective: No complaints.  Breathing better with CPAP.  Objective: Vital signs in last 24 hours: Temp:  [97.6 F (36.4 C)-98.3 F (36.8 C)] 97.6 F (36.4 C) (09/28 0530) Pulse Rate:  [81-88] 88 (09/28 0530) Resp:  [12-20] 20 (09/28 0530) BP: (91-97)/(65-76) 93/72 (09/28 0530) SpO2:  [92 %-99 %] 96 % (09/28 0530) Weight:  [78 kg] 78 kg (09/28 0500) Last BM Date : 07/21/23  Intake/Output from previous day: 09/27 0701 - 09/28 0700 In: 1208 [I.V.:85.9; IV Piggyback:1122.1] Out: 1225 [Urine:1225] Intake/Output this shift: No intake/output data recorded.  General appearance: alert and no distress Resp: clear to auscultation bilaterally Cardio: regular rate and rhythm GI: Distended, mildly tense with ascites  Lab Results: Recent Labs    07/20/23 0609 07/21/23 0808 07/22/23 0608  WBC 7.0 7.2 7.0  HGB 9.1* 8.5* 7.7*  HCT 26.2* 25.9* 23.0*  PLT 260 209 171   BMET Recent Labs    07/20/23 0609 07/21/23 0808 07/22/23 0608  NA 126* 125* 127*  K 4.7 4.2 4.1  CL 97* 95* 95*  CO2 17* 17* 18*  GLUCOSE 74 119* 106*  BUN 49* 50* 49*  CREATININE 2.42* 2.44* 2.43*  CALCIUM 8.0* 8.5* 8.8*   LFT Recent Labs    07/20/23 0609  PROT 5.7*  ALBUMIN 2.0*  AST 31  ALT 9  ALKPHOS 76  BILITOT 1.8*   PT/INR Recent Labs    07/19/23 1446  LABPROT 20.0*  INR 1.7*   Hepatitis Panel No results for input(s): "HEPBSAG", "HCVAB", "HEPAIGM", "HEPBIGM" in the last 72 hours. C-Diff No results for input(s): "CDIFFTOX" in the last 72 hours. Fecal Lactopherrin No results for input(s): "FECLLACTOFRN" in the last 72 hours.  Studies/Results: No results found.  Medications: Scheduled:  allopurinol  300 mg Oral Daily   Chlorhexidine Gluconate Cloth  6 each Topical Daily   midodrine  15 mg Oral TID WC   mirtazapine  15 mg Oral QHS   pantoprazole (PROTONIX) IV  40 mg Intravenous Q12H   polyethylene glycol  17 g Oral Daily   Ensure Max Protein  11 oz Oral QHS   rosuvastatin  5 mg  Oral QPM   senna  1 tablet Oral Daily   Tafamidis  1 capsule Oral Daily   tamsulosin  0.4 mg Oral QHS   Continuous:  sodium chloride Stopped (07/19/23 1209)   cefTRIAXone (ROCEPHIN)  IV Stopped (07/22/23 0301)    Assessment/Plan: 1) Ascites. 2) AKI - not a dialysis candidate. 3) Anemia. 4) Cirrhosis.   The patient opts to have home hospice.  For his comfort a paracentesis is required.  Plan: 1) Paracentesis per IR timing. 2) Signing off.  LOS: 4 days   Loyce Flaming D 07/22/2023, 7:47 AM

## 2023-07-22 NOTE — Progress Notes (Signed)
PROGRESS NOTE                                                                                                                                                                                                             Patient Demographics:    Frank Berg, is a 67 y.o. male, DOB - May 31, 1956, ZOX:[id number]  Outpatient Primary MD for the patient is Adrian Prince, MD    LOS - 4  Admit date - 07/18/2023    Chief Complaint  Patient presents with   Hypotension   Dizziness       Brief Narrative (HPI from H&P)   67 year old male with history of end-stage liver cirrhosis , ischemic cardiomyopathy, cardiac amyloidosis, A-fib on chronic anticoagulation, type 2 diabetes, sleep apnea on CPAP, CKD stage IIIb, recurrent ascites needing paracentesis who was recently hospitalized for GI bleed workup  and was discharged on 07/17/2023 presented with lightheadedness, weakness, syncopal episode.  He was hypotensive on presentation.  Lab work showed sodium of 125, creatinine of 2.1, hemoglobin of 7.1, INR of 3.9.  CTA abdomen did not show any acute bleed but showed intramuscular hematoma of left rectus abdominis which was diagnosed on last admission.  He was seen by GI, PCCM for hypotension, anemia, worsening ascites in the setting of end-stage liver cirrhosis, hyponatremia, possible hepatorenal syndrome.  Transferred to my care on 07/20/2023.   Subjective:   Patient in bed, appears comfortable, denies any headache, no fever, no chest pain or pressure, no shortness of breath , no abdominal pain. No new focal weakness.   Assessment  & Plan :   Acute blood loss anemia due to intra-abdominal muscle hematoma: Patient admitted for GI bleed and was discharged on 07/17/2023.  On his last admission, GI following, EGD was not done, treated with Protonix infusion, octreotide and was discharged on PPI twice daily, Xarelto was resumed for A-fib.  He was  noted to have intramuscular hematoma of rectus abdominis at that time as well.    Patient presented with lightheadedness.  Found to have acute blood loss anemia with hemoglobin in the range of 7.  Transferred with 2 units of PRBC.  This morning hemoglobin stable in the range of 9.  Xarelto on hold.  Patient mentioned seeing darker stool a day before admission.was seen by GI team this admission again, Xarelto was held,  he received vitamin K and Kcentra for Xarelto reversal, on PPI.  Currently CBC has stabilized, continue to monitor on PPI, defer resumption of Xarelto to GI.    End-stage liver cirrhosis likely cardiac cirrhosis versus NASH with recurrent ascites possible hepatorenal syndrome: He had 3 L of fluid removed on 07/17/2023, abdomen is distended with low blood pressure, continue on scheduled midodrine and albumin x 4 doses, will defer to GI if he needs more therapeutic fluid removal on 07/23/2023, options are limited, appreciate input from GI, palliative care and PCCM.  Goal is now comfort directed medical treatment, likely therapeutic paracentesis on 07/23/2023 for comfort thereafter home hospice on 07/24/2023    Chronic, systolic/diastolic CHF: Follows with advanced heart failure team.  Last echo showed EF of 45 to 50%, grade 2 diastolic dysfunction, blood pressure too low for ACE ARB/beta-blocker, diuretics as tolerated by blood pressure.   Hyponatremia: Due to massive third spacing of fluid, diuretics as tolerated, paracentesis as tolerated, limited options, Balaji following for hyponatremia and fluid management.   CKD stage IIIb: Creatinine is in the range of 2, which is his baseline.  Avoid nephrotoxins   OSA: On CPAP nightly   Paroxysmal A-fib: Remains in A-fib with controlled rate.  Xarelto on hold due to #1 above.   Coronary artery disease: Status post CABG.  No anginal symptoms.  Follows with cardiology as an outpatient.  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   Goals of care: Multiple comorbidities with very poor prognosis.  CODE STATUS DNR.  Palliative care consulted, currently DNR/DNI.  Long-term prognosis again appears to be extremely poor, plan is now medical treatment directed towards comfort, discharge to home hospice in the next 1 to 2 days, plan discussed with patient's wife in detail on 07/22/2023.   Type 2 diabetes: Monitor blood sugar.  On Jardiance at home.  Also on sliding scale here.  CBG (last 3)  No results for input(s): "GLUCAP" in the last 72 hours.  Lab Results  Component Value Date   HGBA1C 5.8 (H) 07/15/2023           Condition - Extremely Guarded  Family Communication  :  wife Shawna Orleans  [phone] in detail on 07/20/2023, 07/22/23  Code Status :  DNR-DNI  Consults  :  GI, Renal, PCCM palliative care  PUD Prophylaxis :  PPI   Procedures  :      CT - 1. Interval decrease in size of the left periumbilical rectus abdominus hematoma in keeping with partial resolution. 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 Active extravasation, while not completely excluded on this single phase exam, is considered less likely given the interval decrease in size of the rectus sheath hematoma. This could be further assessed with dedicated sonography if indicated. 2. Cirrhotic changes better appreciated on MRI examination 07/15/2023. Known hypoenhancing lesion within the left hepatic lobe is not well appreciated on this examination. 3. Large volume ascites, progressive since prior examination. 4. Small right and trace left pleural effusions. 5. Moderate prostatic hypertrophy. Aortic Atherosclerosis.      Disposition Plan  :    Status is: Inpatient  DVT Prophylaxis  :     Place TED hose Start: 07/18/23 1820   Lab Results  Component Value Date  PLT 171 07/22/2023    Diet :  Diet Order             Diet Heart Room service appropriate? Yes; Fluid consistency: Thin  Diet effective now                    Inpatient Medications  Scheduled Meds:  allopurinol  300 mg Oral Daily   Chlorhexidine Gluconate Cloth  6 each Topical Daily   midodrine  15 mg Oral TID WC   mirtazapine  15 mg Oral QHS   pantoprazole (PROTONIX) IV  40 mg Intravenous Q12H   polyethylene glycol  17 g Oral Daily   Ensure Max Protein  11 oz Oral QHS   rosuvastatin  5 mg Oral QPM   senna  1 tablet Oral Daily   Tafamidis  1 capsule Oral Daily   tamsulosin  0.4 mg Oral QHS   Continuous Infusions:  sodium chloride Stopped (07/19/23 1209)   cefTRIAXone (ROCEPHIN)  IV Stopped (07/22/23 0301)   PRN Meds:.bacitracin, colchicine, zolpidem  Antibiotics  :    Anti-infectives (From admission, onward)    Start     Dose/Rate Route Frequency Ordered Stop   07/18/23 1645  cefTRIAXone (ROCEPHIN) 1 g in sodium chloride 0.9 % 100 mL IVPB        1 g 200 mL/hr over 30 Minutes Intravenous Every 24 hours 07/18/23 1642           Objective:   Vitals:   07/22/23 0014 07/22/23 0500 07/22/23 0530 07/22/23 0800  BP: 92/76  93/72 90/67  Pulse: 82  88 81  Resp: 16  20 14   Temp: 98.1 F (36.7 C)  97.6 F (36.4 C) 97.9 F (36.6 C)  TempSrc: Oral  Axillary Axillary  SpO2: 99%  96% 96%  Weight:  78 kg    Height:        Wt Readings from Last 3 Encounters:  07/22/23 78 kg  07/17/23 74.6 kg  06/12/23 75.8 kg     Intake/Output Summary (Last 24 hours) at 07/22/2023 0954 Last data filed at 07/22/2023 0531 Gross per 24 hour  Intake 918.92 ml  Output 1225 ml  Net -306.08 ml     Physical Exam  Awake Alert, No new F.N deficits, Normal affect Newman.AT,PERRAL Supple Neck, No JVD,   Symmetrical Chest wall movement, Good air movement bilaterally, CTAB RRR,No Gallops,Rubs or new  Murmurs,  +ve B.Sounds, Abd is distended ++ No Cyanosis, Clubbing or edema       Data Review:    Recent Labs  Lab 07/17/23 0243 07/18/23 1250 07/18/23 1841 07/19/23 1107 07/19/23 1829 07/20/23 0609 07/21/23 0808 07/22/23 0608  WBC 4.6 7.6  --   --   --  7.0 7.2 7.0  HGB 10.5* 7.1*   < > 8.9* 9.7* 9.1* 8.5* 7.7*  HCT 32.2* 22.2*   < > 26.0* 29.3* 26.2* 25.9* 23.0*  PLT 321 242  --   --   --  260 209 171  MCV 88.0 91.0  --   --   --  85.1 84.6 84.2  MCH 28.7 29.1  --   --   --  29.5 27.8 28.2  MCHC 32.6 32.0  --   --   --  34.7 32.8 33.5  RDW 18.6* 18.2*  --   --   --  17.1* 16.7* 16.7*  LYMPHSABS 0.5* 0.3*  --   --   --   --  0.4* 0.4*  MONOABS  0.3 0.4  --   --   --   --  0.4 0.5  EOSABS 0.1 0.0  --   --   --   --  0.1 0.0  BASOSABS 0.0 0.0  --   --   --   --  0.0 0.0   < > = values in this interval not displayed.    Recent Labs  Lab 07/17/23 0243 07/18/23 1123 07/18/23 1250 07/19/23 1446 07/20/23 0609 07/20/23 0835 07/21/23 0808 07/22/23 0608  NA 128*  --  125*  --  126*  --  125* 127*  K 5.3*  --  5.0  --  4.7  --  4.2 4.1  CL 101  --  97*  --  97*  --  95* 95*  CO2 23  --  19*  --  17*  --  17* 18*  ANIONGAP 4*  --  9  --  12  --  13 14  GLUCOSE 99  --  99  --  74  --  119* 106*  BUN 33*  --  42*  --  49*  --  50* 49*  CREATININE 1.89*  --  2.10*  --  2.42*  --  2.44* 2.43*  AST 29  --  31  --  31  --   --   --   ALT 8  --  7  --  9  --   --   --   ALKPHOS 89  --  71  --  76  --   --   --   BILITOT 1.2  --  1.4*  --  1.8*  --   --   --   ALBUMIN 2.3*  --  2.0*  --  2.0*  --   --   --   CRP  --   --   --   --   --   --  20.5* 17.8*  PROCALCITON  --   --   --   --  4.80  --  4.60 3.38  INR  --   --  3.9* 1.7*  --   --   --   --   TSH  --   --   --   --  5.121*  --  5.633*  --   BNP  --  484.6*  --   --   --  527.3* 1,353.8* 2,146.3*  MG 2.3  --   --   --  2.1  --  2.4 2.2  CALCIUM 8.1*  --  7.7*  --  8.0*  --  8.5* 8.8*      Recent Labs  Lab  07/17/23 0243 07/18/23 1123 07/18/23 1250 07/19/23 1446 07/20/23 0609 07/20/23 0835 07/21/23 0808 07/22/23 0608  CRP  --   --   --   --   --   --  20.5* 17.8*  PROCALCITON  --   --   --   --  4.80  --  4.60 3.38  INR  --   --  3.9* 1.7*  --   --   --   --   TSH  --   --   --   --  5.121*  --  5.633*  --   BNP  --  484.6*  --   --   --  527.3* 1,353.8* 2,146.3*  MG 2.3  --   --   --  2.1  --  2.4 2.2  CALCIUM 8.1*  --  7.7*  --  8.0*  --  8.5* 8.8*    --------------------------------------------------------------------------------------------------------------- No results found for: "CHOL", "HDL", "LDLCALC", "LDLDIRECT", "TRIG", "CHOLHDL"  Lab Results  Component Value Date   HGBA1C 5.8 (H) 07/15/2023    Radiology Reports IR Paracentesis  Result Date: 07/19/2023 INDICATION: Heart failure, hepatic cirrhosis, and recurrent ascites. Request for therapeutic paracentesis. EXAM: ULTRASOUND GUIDED PARACENTESIS MEDICATIONS: 1% lidocaine 10 mL COMPLICATIONS: None immediate. PROCEDURE: Informed written consent was obtained from the patient after a discussion of the risks, benefits and alternatives to treatment. A timeout was performed prior to the initiation of the procedure. Initial ultrasound scanning demonstrates a large amount of ascites within the right lower abdominal quadrant. The right lower abdomen was prepped and draped in the usual sterile fashion. 1% lidocaine was used for local anesthesia. Following this, a 19 gauge, 7-cm, Yueh catheter was introduced. An ultrasound image was saved for documentation purposes. The paracentesis was performed. The catheter was removed and a dressing was applied. The patient tolerated the procedure well without immediate post procedural complication. FINDINGS: Only 400 mL of blood was removed. The procedure was stopped secondary to patient developing severe hypotension and concern for bleeding into the abdomen. IMPRESSION: Successful ultrasound-guided  paracentesis yielding 400 mL of blood. Critical results were discussed with the medicine team at the time of procedure. Procedure performed by: Corrin Parker, PA-C Electronically Signed   By: Malachy Moan M.D.   On: 07/19/2023 11:42   CT ABDOMEN PELVIS W CONTRAST  Result Date: 07/18/2023 CLINICAL DATA:  Anemia, abdominal wall hematoma EXAM: CT ABDOMEN AND PELVIS WITH CONTRAST TECHNIQUE: Multidetector CT imaging of the abdomen and pelvis was performed using the standard protocol following bolus administration of intravenous contrast. RADIATION DOSE REDUCTION: This exam was performed according to the departmental dose-optimization program which includes automated exposure control, adjustment of the mA and/or kV according to patient size and/or use of iterative reconstruction technique. CONTRAST:  60mL OMNIPAQUE IOHEXOL 350 MG/ML SOLN COMPARISON:  07/14/2023 FINDINGS: Lower chest: Small right and trace left pleural effusions are present. Bibasilar pulmonary parenchymal scarring. Median sternotomy has been performed. No acute abnormality. Hepatobiliary: Cirrhotic changes better appreciated on MRI examination 07/15/2023. Known hypoenhancing lesion within the left hepatic lobe is not well appreciated on this examination. No gallstones, gallbladder wall thickening, or biliary dilatation. Pancreas: Unremarkable Spleen: Unremarkable Adrenals/Urinary Tract: The adrenal glands are unremarkable. The kidneys are normal in size and position. Simple cortical cyst noted within the interpolar region of the right kidney for which no follow-up imaging is recommended. The kidneys are otherwise unremarkable. Foley catheter balloon seen within a decompressed bladder lumen. Stomach/Bowel: Large volume ascites, progressive since prior examination. Stomach, small bowel, and large bowel are unremarkable. No free intraperitoneal gas. Vascular/Lymphatic: Extensive aortoiliac atherosclerotic calcification. No aortic aneurysm. No  pathologic adenopathy within the abdomen and pelvis. Reproductive: Moderate prostatic hypertrophy. Other: Intramuscular hematoma within the left periumbilical rectus abdominus musculature has decreased in size in keeping with partial resolution. 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 Active extravasation, while not completely excluded on this single phase exam, is considered less likely given the interval decrease in size of the rectus sheath hematoma. Musculoskeletal: No acute bone abnormality. No lytic or blastic bone lesion. Osseous structures are age-appropriate. IMPRESSION: 1. Interval decrease in size  of the left periumbilical rectus abdominus hematoma in keeping with partial resolution. 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 Active extravasation, while not completely excluded on this single phase exam, is considered less likely given the interval decrease in size of the rectus sheath hematoma. This could be further assessed with dedicated sonography if indicated. 2. Cirrhotic changes better appreciated on MRI examination 07/15/2023. Known hypoenhancing lesion within the left hepatic lobe is not well appreciated on this examination. 3. Large volume ascites, progressive since prior examination. 4. Small right and trace left pleural effusions. 5. Moderate prostatic hypertrophy. Aortic Atherosclerosis (ICD10-I70.0). Electronically Signed   By: Helyn Numbers M.D.   On: 07/18/2023 18:45      Signature  -   Susa Raring  M.D on 07/22/2023 at 9:54 AM   -  To page go to [email or web address]

## 2023-07-22 NOTE — Plan of Care (Signed)
  Problem: Activity: Goal: Capacity to carry out activities will improve Outcome: Progressing   Problem: Education: Goal: Knowledge of General Education information will improve Description: Including pain rating scale, medication(s)/side effects and non-pharmacologic comfort measures Outcome: Progressing   Problem: Clinical Measurements: Goal: Ability to maintain clinical measurements within normal limits will improve Outcome: Progressing Goal: Will remain free from infection Outcome: Progressing   Problem: Nutrition: Goal: Adequate nutrition will be maintained Outcome: Progressing   Problem: Safety: Goal: Ability to remain free from injury will improve Outcome: Progressing   Problem: Skin Integrity: Goal: Risk for impaired skin integrity will decrease Outcome: Progressing

## 2023-07-23 ENCOUNTER — Inpatient Hospital Stay (HOSPITAL_COMMUNITY): Payer: Medicare HMO

## 2023-07-23 DIAGNOSIS — Z515 Encounter for palliative care: Secondary | ICD-10-CM | POA: Diagnosis not present

## 2023-07-23 DIAGNOSIS — Z7189 Other specified counseling: Secondary | ICD-10-CM | POA: Diagnosis not present

## 2023-07-23 DIAGNOSIS — K7460 Unspecified cirrhosis of liver: Secondary | ICD-10-CM | POA: Diagnosis not present

## 2023-07-23 DIAGNOSIS — N179 Acute kidney failure, unspecified: Secondary | ICD-10-CM | POA: Diagnosis not present

## 2023-07-23 LAB — COMPREHENSIVE METABOLIC PANEL
ALT: 9 U/L (ref 0–44)
AST: 22 U/L (ref 15–41)
Albumin: 2.9 g/dL — ABNORMAL LOW (ref 3.5–5.0)
Alkaline Phosphatase: 89 U/L (ref 38–126)
Anion gap: 11 (ref 5–15)
BUN: 44 mg/dL — ABNORMAL HIGH (ref 8–23)
CO2: 18 mmol/L — ABNORMAL LOW (ref 22–32)
Calcium: 8.3 mg/dL — ABNORMAL LOW (ref 8.9–10.3)
Chloride: 96 mmol/L — ABNORMAL LOW (ref 98–111)
Creatinine, Ser: 2.03 mg/dL — ABNORMAL HIGH (ref 0.61–1.24)
GFR, Estimated: 35 mL/min — ABNORMAL LOW (ref >60)
Glucose, Bld: 88 mg/dL (ref 70–99)
Potassium: 3.9 mmol/L (ref 3.5–5.1)
Sodium: 125 mmol/L — ABNORMAL LOW (ref 135–145)
Total Bilirubin: 2.3 mg/dL — ABNORMAL HIGH (ref 0.3–1.2)
Total Protein: 6 g/dL — ABNORMAL LOW (ref 6.5–8.1)

## 2023-07-23 LAB — CBC WITH DIFFERENTIAL/PLATELET
Abs Immature Granulocytes: 0.03 10*3/uL (ref 0.00–0.07)
Basophils Absolute: 0 10*3/uL (ref 0.0–0.1)
Basophils Relative: 1 %
Eosinophils Absolute: 0.1 10*3/uL (ref 0.0–0.5)
Eosinophils Relative: 1 %
HCT: 25 % — ABNORMAL LOW (ref 39.0–52.0)
Hemoglobin: 8.3 g/dL — ABNORMAL LOW (ref 13.0–17.0)
Immature Granulocytes: 1 %
Lymphocytes Relative: 7 %
Lymphs Abs: 0.4 10*3/uL — ABNORMAL LOW (ref 0.7–4.0)
MCH: 28.6 pg (ref 26.0–34.0)
MCHC: 33.2 g/dL (ref 30.0–36.0)
MCV: 86.2 fL (ref 80.0–100.0)
Monocytes Absolute: 0.4 10*3/uL (ref 0.1–1.0)
Monocytes Relative: 7 %
Neutro Abs: 4.6 10*3/uL (ref 1.7–7.7)
Neutrophils Relative %: 83 %
Platelets: 189 10*3/uL (ref 150–400)
RBC: 2.9 MIL/uL — ABNORMAL LOW (ref 4.22–5.81)
RDW: 16.6 % — ABNORMAL HIGH (ref 11.5–15.5)
WBC: 5.5 10*3/uL (ref 4.0–10.5)
nRBC: 0 % (ref 0.0–0.2)

## 2023-07-23 LAB — PROCALCITONIN: Procalcitonin: 1.72 ng/mL

## 2023-07-23 LAB — BODY FLUID CELL COUNT WITH DIFFERENTIAL
Eos, Fluid: 1 %
Lymphs, Fluid: 7 %
Monocyte-Macrophage-Serous Fluid: 26 % — ABNORMAL LOW (ref 50–90)
Neutrophil Count, Fluid: 66 % — ABNORMAL HIGH (ref 0–25)
Total Nucleated Cell Count, Fluid: 1850 mm3 — ABNORMAL HIGH (ref 0–1000)

## 2023-07-23 LAB — BRAIN NATRIURETIC PEPTIDE: B Natriuretic Peptide: 1253 pg/mL — ABNORMAL HIGH (ref 0.0–100.0)

## 2023-07-23 LAB — PROTEIN, PLEURAL OR PERITONEAL FLUID: Total protein, fluid: 4.1 g/dL

## 2023-07-23 LAB — C-REACTIVE PROTEIN: CRP: 15.6 mg/dL — ABNORMAL HIGH (ref <1.0)

## 2023-07-23 LAB — ALBUMIN, PLEURAL OR PERITONEAL FLUID: Albumin, Fluid: 1.9 g/dL

## 2023-07-23 LAB — MAGNESIUM: Magnesium: 2.2 mg/dL (ref 1.7–2.4)

## 2023-07-23 MED ORDER — DILTIAZEM HCL 25 MG/5ML IV SOLN: 10.0000 mg | Freq: Four times a day (QID) | INTRAVENOUS | Status: DC | PRN

## 2023-07-23 MED ORDER — LACTATED RINGERS IV BOLUS
500.0000 mL | Freq: Once | INTRAVENOUS | Status: AC
  Administered 2023-07-23: 500 mL via INTRAVENOUS

## 2023-07-23 MED ORDER — LIDOCAINE HCL (PF) 1 % IJ SOLN
7.0000 mL | Freq: Once | INTRAMUSCULAR | Status: AC
  Administered 2023-07-23: 7 mL via INTRADERMAL

## 2023-07-23 MED ORDER — ALBUMIN HUMAN 25 % IV SOLN
50.0000 g | Freq: Once | INTRAVENOUS | Status: AC | PRN
  Administered 2023-07-23: 50 g via INTRAVENOUS
  Filled 2023-07-23: qty 200

## 2023-07-23 NOTE — Progress Notes (Signed)
PROGRESS NOTE                                                                                                                                                                                                             Patient Demographics:    Frank Berg, is a 67 y.o. male, DOB - 13-Nov-1955, FIE:[id number]  Outpatient Primary MD for the patient is Adrian Prince, MD    LOS - 5  Admit date - 07/18/2023    Chief Complaint  Patient presents with   Hypotension   Dizziness       Brief Narrative (HPI from H&P)   67 year old male with history of end-stage liver cirrhosis , ischemic cardiomyopathy, cardiac amyloidosis, A-fib on chronic anticoagulation, type 2 diabetes, sleep apnea on CPAP, CKD stage IIIb, recurrent ascites needing paracentesis who was recently hospitalized for GI bleed workup  and was discharged on 07/17/2023 presented with lightheadedness, weakness, syncopal episode.  He was hypotensive on presentation.  Lab work showed sodium of 125, creatinine of 2.1, hemoglobin of 7.1, INR of 3.9.  CTA abdomen did not show any acute bleed but showed intramuscular hematoma of left rectus abdominis which was diagnosed on last admission.  He was seen by GI, PCCM for hypotension, anemia, worsening ascites in the setting of end-stage liver cirrhosis, hyponatremia, possible hepatorenal syndrome.  Transferred to my care on 07/20/2023.   Subjective:   Patient in bed, denies any headache chest or abdominal pain, wants to get paracentesis today and go home with hospice tomorrow   Assessment  & Plan :   Acute blood loss anemia due to intra-abdominal muscle hematoma: Patient admitted for GI bleed and was discharged on 07/17/2023.  On his last admission, GI following, EGD was not done, treated with Protonix infusion, octreotide and was discharged on PPI twice daily, Xarelto was resumed for A-fib.  He was noted to have intramuscular hematoma of  rectus abdominis at that time as well.    Patient presented with lightheadedness.  Found to have acute blood loss anemia with hemoglobin in the range of 7.  Transferred with 2 units of PRBC.  This morning hemoglobin stable in the range of 9.  Xarelto on hold.  Patient mentioned seeing darker stool a day before admission.was seen by GI team this admission again, Xarelto was held, he received vitamin K and [place]  for Xarelto reversal, on PPI.  Currently CBC has stabilized, continue to monitor on PPI, defer resumption of Xarelto to GI.    End-stage liver cirrhosis likely cardiac cirrhosis versus NASH with recurrent ascites possible hepatorenal syndrome: He had 3 L of fluid removed on 07/17/2023, abdomen is distended with low blood pressure, continue on scheduled midodrine and albumin x 4 doses, will defer to GI if he needs more therapeutic fluid removal on 07/23/2023, options are limited, appreciate input from GI, palliative care and PCCM.  Goal is now comfort directed medical treatment, likely therapeutic paracentesis on 07/23/2023 for comfort thereafter home hospice on 07/24/2023    Chronic, systolic/diastolic CHF: Follows with advanced heart failure team.  Last echo showed EF of 45 to 50%, grade 2 diastolic dysfunction, blood pressure too low for ACE ARB/beta-blocker, diuretics as tolerated by blood pressure.   Hyponatremia: Due to massive third spacing of fluid, diuretics as tolerated, paracentesis as tolerated, limited options, [facility] following for hyponatremia and fluid management.   CKD stage IIIb: Creatinine is in the range of 2, which is his baseline.  Avoid nephrotoxins   OSA: On CPAP nightly   Paroxysmal A-fib: Remains in A-fib with controlled rate.  Xarelto on hold due to #1 above.   Coronary artery disease: Status post CABG.  No anginal symptoms.  Follows with cardiology as an outpatient.  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   Goals of  care: Multiple comorbidities with very poor prognosis.  CODE STATUS DNR.  Palliative care consulted, currently DNR/DNI.  Long-term prognosis again appears to be extremely poor, plan is now medical treatment directed towards comfort, discharge to home hospice in the next 1 to 2 days, plan discussed with patient's wife in detail on 07/22/2023.   Type 2 diabetes: Monitor blood sugar.  On Jardiance at home.  Also on sliding scale here.  CBG (last 3)  No results for input(s): "GLUCAP" in the last 72 hours.  Lab Results  Component Value Date   HGBA1C 5.8 (H) 07/15/2023           Condition - Extremely Guarded  Family Communication  :  wife Shawna Orleans  [phone] in detail on 07/20/2023, 07/22/23  Code Status :  DNR-DNI  Consults  :  GI, Renal, PCCM palliative care  PUD Prophylaxis :  PPI   Procedures  :      CT - 1. Interval decrease in size of the left periumbilical rectus abdominus hematoma in keeping with partial resolution. 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 Active extravasation, while not completely excluded on this single phase exam, is considered less likely given the interval decrease in size of the rectus sheath hematoma. This could be further assessed with dedicated sonography if indicated. 2. Cirrhotic changes better appreciated on MRI examination 07/15/2023. Known hypoenhancing lesion within the left hepatic lobe is not well appreciated on this examination. 3. Large volume ascites, progressive since prior examination. 4. Small right and trace left pleural effusions. 5. Moderate prostatic hypertrophy. Aortic Atherosclerosis.      Disposition Plan  :    Status is: Inpatient  DVT Prophylaxis  :    Place TED hose Start: 07/18/23  1820   Lab Results  Component Value Date   PLT 171 07/22/2023  Diet :  Diet Order             Diet Heart Room service appropriate? Yes; Fluid consistency: Thin  Diet effective now                    Inpatient Medications  Scheduled Meds:  allopurinol  300 mg Oral Daily   Chlorhexidine Gluconate Cloth  6 each Topical Daily   midodrine  15 mg Oral TID WC   mirtazapine  15 mg Oral QHS   pantoprazole (PROTONIX) IV  40 mg Intravenous Q12H   polyethylene glycol  17 g Oral Daily   Ensure Max Protein  11 oz Oral QHS   rosuvastatin  5 mg Oral QPM   senna  1 tablet Oral Daily   Tafamidis  1 capsule Oral Daily   tamsulosin  0.4 mg Oral QHS   Continuous Infusions:  sodium chloride Stopped (07/19/23 1209)   albumin human     cefTRIAXone (ROCEPHIN)  IV 1 g (07/22/23 1730)   PRN Meds:.albumin human, bacitracin, colchicine, diltiazem, zolpidem  Antibiotics  :    Anti-infectives (From admission, onward)    Start     Dose/Rate Route Frequency Ordered Stop   07/18/23 1645  cefTRIAXone (ROCEPHIN) 1 g in sodium chloride 0.9 % 100 mL IVPB        1 g 200 mL/hr over 30 Minutes Intravenous Every 24 hours 07/18/23 1642           Objective:   Vitals:   07/23/23 0500 07/23/23 0546 07/23/23 0640 07/23/23 0810  BP:  (!) 87/72 95/74 104/70  Pulse:  80 83 84  Resp:  16 13 13   Temp:  98.2 F (36.8 C)  98.5 F (36.9 C)  TempSrc:  Oral  Oral  SpO2:  96% 95% 95%  Weight: 79.5 kg     Height:        Wt Readings from Last 3 Encounters:  07/23/23 79.5 kg  07/17/23 74.6 kg  06/12/23 75.8 kg     Intake/Output Summary (Last 24 hours) at 07/23/2023 1012 Last data filed at 07/23/2023 0548 Gross per 24 hour  Intake --  Output 650 ml  Net -650 ml     Physical Exam  Awake Alert, No new F.N deficits, Normal affect [place].AT,PERRAL Supple Neck, No JVD,   Symmetrical Chest wall movement, Good air movement bilaterally, CTAB RRR,No Gallops,Rubs or new Murmurs,  +ve B.Sounds, Abd  is distended ++ No Cyanosis, Clubbing or edema       Data Review:    Recent Labs  Lab 07/17/23 0243 07/18/23 1250 07/18/23 1841 07/19/23 1107 07/19/23 1829 07/20/23 0609 07/21/23 0808 07/22/23 0608  WBC 4.6 7.6  --   --   --  7.0 7.2 7.0  HGB 10.5* 7.1*   < > 8.9* 9.7* 9.1* 8.5* 7.7*  HCT 32.2* 22.2*   < > 26.0* 29.3* 26.2* 25.9* 23.0*  PLT 321 242  --   --   --  260 209 171  MCV 88.0 91.0  --   --   --  85.1 84.6 84.2  MCH 28.7 29.1  --   --   --  29.5 27.8 28.2  MCHC 32.6 32.0  --   --   --  34.7 32.8 33.5  RDW 18.6* 18.2*  --   --   --  17.1* 16.7* 16.7*  LYMPHSABS 0.5* 0.3*  --   --   --   --  0.4* 0.4*  MONOABS 0.3 0.4  --   --   --   --  0.4 0.5  EOSABS 0.1 0.0  --   --   --   --  0.1 0.0  BASOSABS 0.0 0.0  --   --   --   --  0.0 0.0   < > = values in this interval not displayed.    Recent Labs  Lab 07/17/23 0243 07/18/23 1123 07/18/23 1250 07/19/23 1446 07/20/23 0609 07/20/23 0835 07/21/23 0808 07/22/23 0608  NA 128*  --  125*  --  126*  --  125* 127*  K 5.3*  --  5.0  --  4.7  --  4.2 4.1  CL 101  --  97*  --  97*  --  95* 95*  CO2 23  --  19*  --  17*  --  17* 18*  ANIONGAP 4*  --  9  --  12  --  13 14  GLUCOSE 99  --  99  --  74  --  119* 106*  BUN 33*  --  42*  --  49*  --  50* 49*  CREATININE 1.89*  --  2.10*  --  2.42*  --  2.44* 2.43*  AST 29  --  31  --  31  --   --   --   ALT 8  --  7  --  9  --   --   --   ALKPHOS 89  --  71  --  76  --   --   --   BILITOT 1.2  --  1.4*  --  1.8*  --   --   --   ALBUMIN 2.3*  --  2.0*  --  2.0*  --   --   --   CRP  --   --   --   --   --   --  20.5* 17.8*  PROCALCITON  --   --   --   --  4.80  --  4.60 3.38  INR  --   --  3.9* 1.7*  --   --   --   --   TSH  --   --   --   --  5.121*  --  5.633*  --   BNP  --  484.6*  --   --   --  527.3* 1,353.8* 2,146.3*  MG 2.3  --   --   --  2.1  --  2.4 2.2  CALCIUM 8.1*  --  7.7*  --  8.0*  --  8.5* 8.8*      Recent Labs  Lab 07/17/23 0243 07/18/23 1123  07/18/23 1250 07/19/23 1446 07/20/23 0609 07/20/23 0835 07/21/23 0808 07/22/23 0608  CRP  --   --   --   --   --   --  20.5* 17.8*  PROCALCITON  --   --   --   --  4.80  --  4.60 3.38  INR  --   --  3.9* 1.7*  --   --   --   --   TSH  --   --   --   --  5.121*  --  5.633*  --   BNP  --  484.6*  --   --   --  527.3* 1,353.8* 2,146.3*  MG 2.3  --   --   --  2.1  --  2.4  2.2  CALCIUM 8.1*  --  7.7*  --  8.0*  --  8.5* 8.8*    --------------------------------------------------------------------------------------------------------------- No results found for: "CHOL", "HDL", "LDLCALC", "LDLDIRECT", "TRIG", "CHOLHDL"  Lab Results  Component Value Date   HGBA1C 5.8 (H) 07/15/2023    Radiology Reports IR Paracentesis  Result Date: 07/19/2023 INDICATION: Heart failure, hepatic cirrhosis, and recurrent ascites. Request for therapeutic paracentesis. EXAM: ULTRASOUND GUIDED PARACENTESIS MEDICATIONS: 1% lidocaine 10 mL COMPLICATIONS: None immediate. PROCEDURE: Informed written consent was obtained from the patient after a discussion of the risks, benefits and alternatives to treatment. A timeout was performed prior to the initiation of the procedure. Initial ultrasound scanning demonstrates a large amount of ascites within the right lower abdominal quadrant. The right lower abdomen was prepped and draped in the usual sterile fashion. 1% lidocaine was used for local anesthesia. Following this, a 19 gauge, 7-cm, Yueh catheter was introduced. An ultrasound image was saved for documentation purposes. The paracentesis was performed. The catheter was removed and a dressing was applied. The patient tolerated the procedure well without immediate post procedural complication. FINDINGS: Only 400 mL of blood was removed. The procedure was stopped secondary to patient developing severe hypotension and concern for bleeding into the abdomen. IMPRESSION: Successful ultrasound-guided paracentesis yielding 400 mL of  blood. Critical results were discussed with the medicine team at the time of procedure. Procedure performed by: Corrin Parker, PA-C Electronically Signed   By: Malachy Moan M.D.   On: 07/19/2023 11:42      Signature  -   Susa Raring M.D on 07/23/2023 at 10:12 AM   -  To page go to [email or web address]

## 2023-07-23 NOTE — TOC CM/SW Note (Signed)
CSW followed up the patient's disposition from the previous day (07/21/27). The patient's disposition is set for home hospice.   This Clinical research associate spoke with [facility] representative: Marshall Cork via chat, where he notes the patient's wife is visiting the hospital today around lunch-time (which is undetermined). When the wife shows up to the hospital, she will meet with the [facility] representative Yong Channel. Once this happens the patient and wife will make a decision on the home hospice agency they will go with.This Clinical research associate will continue to follow and provide disposition support as requested.

## 2023-07-23 NOTE — Progress Notes (Signed)
This pt has 2 episode of HR went up to 160-170 b/m but doesn't sustain over the period, went back to NSR (80-90), asymptomatic. Made MD aware

## 2023-07-23 NOTE — Plan of Care (Signed)
  Problem: Education: Goal: Knowledge of General Education information will improve Description: Including pain rating scale, medication(s)/side effects and non-pharmacologic comfort measures Outcome: Progressing   Problem: Clinical Measurements: Goal: Ability to maintain clinical measurements within normal limits will improve Outcome: Progressing Goal: Will remain free from infection Outcome: Progressing   Problem: Activity: Goal: Risk for activity intolerance will decrease Outcome: Progressing   Problem: Nutrition: Goal: Adequate nutrition will be maintained Outcome: Progressing   Problem: Safety: Goal: Ability to remain free from injury will improve Outcome: Progressing   Problem: Skin Integrity: Goal: Risk for impaired skin integrity will decrease Outcome: Progressing

## 2023-07-23 NOTE — Progress Notes (Signed)
[facility] [phone] [facility] Liaison Note  Met with patient and wife, Shawna Orleans at bedside to discuss hospice services in the home after discharge.  Educated provided on hospice philosophy, services, and team approach to care.  Patient and family verbalized understanding and would like to proceed with hospice services in home through [facility] hospice.  Tentative plan for discharge is home tomorrow once equipment delivered.    DME needs discussed and will request delivery of hospital bed, overbed table, bedside commode, shower chair and rolling walker per family request.  Wife understands to expect call tomorrow to schedule equipment delivery time and call to schedule intake visit for hospice services.   Wife provided with number to hosptial liaision team and [facility] main number if additional questions arise.  Please send signed and completed DNR home with patient at discharge.  MD, please provide prescriptions for any ongoing symptom management needs.  TOC and MD aware of update to plan.  Thank you for the opportunity to participate in this patient's care.  Doreatha Martin, RN, [facility] [phone]

## 2023-07-24 ENCOUNTER — Other Ambulatory Visit (HOSPITAL_COMMUNITY): Payer: Self-pay

## 2023-07-24 ENCOUNTER — Other Ambulatory Visit: Payer: Self-pay

## 2023-07-24 DIAGNOSIS — D62 Acute posthemorrhagic anemia: Secondary | ICD-10-CM | POA: Diagnosis not present

## 2023-07-24 LAB — C-REACTIVE PROTEIN: CRP: 14.2 mg/dL — ABNORMAL HIGH (ref <1.0)

## 2023-07-24 LAB — PATHOLOGIST SMEAR REVIEW: Path Review: REACTIVE

## 2023-07-24 LAB — CBC WITH DIFFERENTIAL/PLATELET
Abs Immature Granulocytes: 0.04 10*3/uL (ref 0.00–0.07)
Basophils Absolute: 0 10*3/uL (ref 0.0–0.1)
Basophils Relative: 0 %
Eosinophils Absolute: 0.1 10*3/uL (ref 0.0–0.5)
Eosinophils Relative: 1 %
HCT: 26.9 % — ABNORMAL LOW (ref 39.0–52.0)
Hemoglobin: 9.1 g/dL — ABNORMAL LOW (ref 13.0–17.0)
Immature Granulocytes: 1 %
Lymphocytes Relative: 11 %
Lymphs Abs: 0.6 10*3/uL — ABNORMAL LOW (ref 0.7–4.0)
MCH: 29.3 pg (ref 26.0–34.0)
MCHC: 33.8 g/dL (ref 30.0–36.0)
MCV: 86.5 fL (ref 80.0–100.0)
Monocytes Absolute: 0.5 10*3/uL (ref 0.1–1.0)
Monocytes Relative: 9 %
Neutro Abs: 4.3 10*3/uL (ref 1.7–7.7)
Neutrophils Relative %: 78 %
Platelets: 203 10*3/uL (ref 150–400)
RBC: 3.11 MIL/uL — ABNORMAL LOW (ref 4.22–5.81)
RDW: 16.7 % — ABNORMAL HIGH (ref 11.5–15.5)
WBC: 5.5 10*3/uL (ref 4.0–10.5)
nRBC: 0 % (ref 0.0–0.2)

## 2023-07-24 LAB — BRAIN NATRIURETIC PEPTIDE: B Natriuretic Peptide: 1299.8 pg/mL — ABNORMAL HIGH (ref 0.0–100.0)

## 2023-07-24 LAB — BASIC METABOLIC PANEL
Anion gap: 11 (ref 5–15)
BUN: 44 mg/dL — ABNORMAL HIGH (ref 8–23)
CO2: 18 mmol/L — ABNORMAL LOW (ref 22–32)
Calcium: 8.5 mg/dL — ABNORMAL LOW (ref 8.9–10.3)
Chloride: 97 mmol/L — ABNORMAL LOW (ref 98–111)
Creatinine, Ser: 2.02 mg/dL — ABNORMAL HIGH (ref 0.61–1.24)
GFR, Estimated: 35 mL/min — ABNORMAL LOW (ref >60)
Glucose, Bld: 156 mg/dL — ABNORMAL HIGH (ref 70–99)
Potassium: 4.6 mmol/L (ref 3.5–5.1)
Sodium: 126 mmol/L — ABNORMAL LOW (ref 135–145)

## 2023-07-24 LAB — MAGNESIUM: Magnesium: 2.2 mg/dL (ref 1.7–2.4)

## 2023-07-24 LAB — PROCALCITONIN: Procalcitonin: 1.56 ng/mL

## 2023-07-24 MED ORDER — CIPROFLOXACIN HCL 500 MG PO TABS
500.0000 mg | ORAL_TABLET | Freq: Two times a day (BID) | ORAL | 0 refills | Status: DC
Start: 2023-07-24 — End: 2023-08-01
  Filled 2023-07-24: qty 14, 7d supply, fill #0

## 2023-07-24 MED ORDER — MIDODRINE HCL 10 MG PO TABS
10.0000 mg | ORAL_TABLET | Freq: Three times a day (TID) | ORAL | 0 refills | Status: DC
Start: 2023-07-24 — End: 2024-04-04
  Filled 2023-07-24: qty 90, 30d supply, fill #0

## 2023-07-24 MED ORDER — LORAZEPAM 1 MG PO TABS
1.0000 mg | ORAL_TABLET | Freq: Three times a day (TID) | ORAL | 0 refills | Status: DC | PRN
  Filled 2023-07-24: qty 12, 4d supply, fill #0

## 2023-07-24 MED ORDER — HYDROMORPHONE HCL 2 MG PO TABS
1.0000 mg | ORAL_TABLET | Freq: Four times a day (QID) | ORAL | 0 refills | Status: DC | PRN
Start: 2023-07-24 — End: 2023-08-01
  Filled 2023-07-24: qty 10, 5d supply, fill #0

## 2023-07-24 NOTE — TOC Transition Note (Addendum)
Transition of Care [facility]) - CM/SW Discharge Note   Patient Details  Name: Frank Berg MRN: [id number] Date of Birth: [birth year]  [facility] CM/SW Contact:  Gordy Clement, RN Phone Number: 07/24/2023, 10:56 AM   Clinical Narrative:     Patient to DC to home today  Going home with hospice DME to be delivered in the home between 12-3:00PM  Wife will come pick up patient after DME is delivered in the home  [facility] will provide Hospice services              Patient Goals and CMS Choice      Discharge Placement                         Discharge Plan and Services Additional resources added to the After Visit Summary for                                       Social Determinants of Health (SDOH) Interventions SDOH Screenings   Food Insecurity: No Food Insecurity (07/15/2023)  Housing: Low Risk  (07/15/2023)  Transportation Needs: No Transportation Needs (07/15/2023)  Utilities: Not At Risk (07/15/2023)  Financial Resource Strain: Low Risk  (07/06/2022)   [facility]  Social Connections: Unknown (03/07/2022)   [facility]  Tobacco Use: Medium Risk (07/18/2023)     Readmission Risk Interventions     No data to display

## 2023-07-24 NOTE — Progress Notes (Signed)
PT Cancellation Note  Patient Details Name: Frank Berg MRN: [id number] DOB: 1955-12-20   Cancelled Treatment:    Reason Eval/Treat Not Completed: Fatigue/lethargy limiting ability to participate. Pt declined PT session, states he wants to rest before discharging. Acute PT to follow.   Hilton Cork, PT, DPT Secure Chat Preferred  Rehab Office [phone]   Arturo Morton Brion Aliment 07/24/2023, 12:53 PM

## 2023-07-24 NOTE — Plan of Care (Signed)
  Problem: Activity: Goal: Capacity to carry out activities will improve Outcome: Progressing   Problem: Education: Goal: Knowledge of General Education information will improve Description: Including pain rating scale, medication(s)/side effects and non-pharmacologic comfort measures Outcome: Progressing   Problem: Clinical Measurements: Goal: Ability to maintain clinical measurements within normal limits will improve Outcome: Progressing   Problem: Activity: Goal: Risk for activity intolerance will decrease Outcome: Progressing   Problem: Safety: Goal: Ability to remain free from injury will improve Outcome: Progressing   Problem: Skin Integrity: Goal: Risk for impaired skin integrity will decrease Outcome: Progressing

## 2023-07-24 NOTE — Progress Notes (Signed)
Spoke with pt's wife and reviewed AVS. Wife verbalized understanding. Wife stated that [facility] would delivered requested DME today between 12pm-3pm. Wife will transport pt home when DME has been delivered.

## 2023-07-24 NOTE — Discharge Summary (Signed)
and VIDEO: IMPRESSION: CENTRAL Sleep disordered breathing was present. The patient was unable to sleep (in spite of Ambien?)  This study was ordered as a PSG, so a titration was not attempted. The patient will likely need at least BiPAP ST, and most likely ASV with this proportion of central apnea.  I ordered a return study (in-lab) for this special titration. The patient will need to bring his sleep aid with him as should all sleep patients.   Total sleep time was reduced at 89.5 minutes.  Sleep efficiency was decreased at 22.3%.  RECOMMENDATIONS: Melvyn Novas, MD  General Information Name: Frank, Berg BMI: 21.44 Physician: Melvyn Novas, MD ID: [id number] Height: 74.0 in Technician: Domingo Cocking, RPSGT Sex: Male Weight: 167.0 lb Record: [id number] Age: 67 [1956-07-05] Date: 07/09/2023   Medical & Medication History per Butch Penny, NP    Mr. Skeet is a 67 year old male with a history of obstructive sleep apnea on CPAP. He reports that he has been having shortness of breath. He is seeing cardiologist for heart failure. He reports that they are adjusting his medications. He states that night he feels that he is not getting enough pressure when he puts the mask on. Wife reports that he does need a new machine. Zyloprim, colchicine, Jardiance, Ensure Max  Protein, iron, Claritin, Proamatine, Remeron, Multivitamins w/minerals, Xarelto, Crestor, Adcirca, Vyndamax, Demadex, Ambien  Sleep Disorder    Comments  Patient arrived for a diagnostic polysomnogram. Procedure explained and all questions answered. Standard paste setup without complications. Patient sleep efficiency was greatly reduced at 22.32%. After 2.5 hours recording time, patient's total sleep time = 22 fragmented minutes. Patient slept supine. Mild snoring heard. Respiratory events observed, many central events as well as mixed events. Cardiac arrhythmias observed. Patient has a known cardiac history. No significant PLMS observed. No restroom visits for nocturia, as patient is using a [place] catheter.   Lights out: 10:11:23 PM Lights on: 04:52:25 AM Time Total Supine Side Prone Upright Recording (TRT) 6h 41.15m 6h 33.76m 0h 7.48m 0h 0.68m 0h 0.39m Sleep (TST) 1h 29.30m 1h 29.55m 0h 0.74m 0h 0.37m 0h 0.40m Latency N1 N2 N3 REM Onset Per. Slp. Eff. Actual 1h 3.53m 2h 2.46m 0h 0.63m 0h 0.60m 1h 3.24m 2h 3.56m 22.32% Stg Dur Wake N1 N2 N3 REM Total 232.0 52.5 37.0 0.0 0.0 Supine 232.0 52.5 37.0 0.0 0.0 Side 0.0 0.0 0.0 0.0 0.0 Prone 0.0 0.0 0.0 0.0 0.0 Upright 0.0 0.0 0.0 0.0 0.0  Stg % Wake N1 N2 N3 REM Total 72.2 58.7 41.3 0.0 0.0 Supine 72.2 58.7 41.3 0.0 0.0 Side 0.0 0.0 0.0 0.0 0.0 Prone 0.0 0.0 0.0 0.0 0.0 Upright 0.0 0.0 0.0 0.0 0.0  Apnea Summary Sub Supine Side Prone Upright Total 66 Total 66 66 0 0 0   REM 0 0 0 0 0   NREM 66 66 0 0 0 Obs 0 REM 0 0 0 0 0   NREM 0 0 0 0 0 Mix 16 REM 0 0 0 0 0   NREM 16 16 0 0 0 Cen 50 REM 0 0 0 0 0   NREM 50 50 0 0 0 Rera Summary Sub Supine Side Prone Upright Total 0 Total 0 0 0 0 0   REM 0 0 0 0 0   NREM 0 0 0 0 0  Hypopnea Summary Sub Supine Side Prone Upright Total 8 Total 8 8 0 0 0   REM 0 0 0 0 0   NREM 8 8  [facility] POLYSOMNOGRAPHY  INTERPRETATION REPORT STUDY DATE:  07/09/2023  PATIENT NAME:  Frank Berg        DATE OF BIRTH:  1956-10-22 PATIENT ID:  [id number]    TYPE OF STUDY:  PSG READING PHYSICIAN: Melvyn Novas, MD REFERRED BY: Ursula Alert, NP PCP :  Dr Evlyn Kanner Neurologist : DR Hampton Abbot Cardiologist: Dr Milas Kocher  SCORING TECHNICIAN: Domingo Cocking, RPSGT HISTORY:  Frank Berg is 67 year-old Male patient on CPAP who feels that his PAP machine is not longer providing enough pressure. This patient was seen by Lamont Dowdy, NP, who ordered this study as a SPLIT on 06-12-2023. There was not enough sleep time to split this study- New machine needed by date of issue. This patient's medication list includes Ambien.  This patient carries a dx of amyloid neuropathy, followed by specialist clinic at [facility], Neuromuscular division. 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 Gala Romney) for cardiomyopathy. Remains on Tafamidis ADDITIONAL INFORMATION:  The Epworth Sleepiness Scale endorsed at 4 /24 points (scores above or equal to 10 are suggestive of hypersomnolence). Height: 74 in Weight: 167 lb (BMI 21) Neck Size: 0 in  TECHNICAL DESCRIPTION: A registered sleep technologist ( RPSGT)  was in attendance for the duration of the recording.  Data collection, scoring, video monitoring, and reporting were performed in compliance with the AASM Manual for the Scoring of Sleep and Associated Events; (Hypopnea is scored based on the criteria listed in Section VIII D. 1b in the AASM Manual V2.6 using a 4% oxygen desaturation rule or Hypopnea is scored based on the criteria listed in Section VIII D. 1a in the AASM Manual V2.6 using 3% oxygen desaturation and /or arousal rule). SLEEP CONTINUITY AND SLEEP ARCHITECTURE:  Lights-out was at 22:11: and lights-on at  04:52:, with  6.7 hours of recording time . Total sleep time ( TST) was 89.5 minutes with a decreased sleep efficiency at 22.3%. BODY POSITION:  TST was 100.0% supine. Sleep latency was increased at 63.0 minutes.  REM sleep latency was decreased at 0.0 minutes. Of the total sleep time, the percentage of stage N1 sleep was 58.7%, stage N2 sleep was 41%, stage N3 sleep was 0.0%, and REM sleep was 0.0%. RESPIRATORY MONITORING: Based on CMS criteria (using a 4% oxygen desaturation rule for scoring hypopneas), there were 66 apneas (0 obstructive; 50 central; 16 mixed), and 3 hypopneas.  Apnea index was 44.2. Hypopnea index was 2.0. The apnea-hypopnea index was 46.3 overall (46.3 supine, 0 non-supine; 0.0 REM, 0.0 supine REM). OXIMETRY: Oxyhemoglobin Saturation Nadir during sleep was at  90%) from a mean of 99%.  Of the  Total sleep time (TST)  hypoxemia (=<88%) was present for  0.0 minutes, or 0.0% of total sleep time. LIMB MOVEMENTS: There were 4 periodic limb movements of sleep (2.7/hr), of which 1 (0.7/hr) were associated with an arousal. AROUSAL: There were 69 arousals in total, for an arousal index of 46 arousals/hour.  Of these, 51 were identified as respiratory-related arousals (34 /h), 1 were PLM-related arousals (1 /h), and 37 were non-specific arousals (25 /h). EEG:  PSG EEG was of normal amplitude and frequency, with symmetric manifestation of sleep stages. EKG: The electrocardiogram documented irregular shaped QRS complexes and R to R intervals. AUDIO  and VIDEO: IMPRESSION: CENTRAL Sleep disordered breathing was present. The patient was unable to sleep (in spite of Ambien?)  This study was ordered as a PSG, so a titration was not attempted. The patient will likely need at least BiPAP ST, and most likely ASV with this proportion of central apnea.  I ordered a return study (in-lab) for this special titration. The patient will need to bring his sleep aid with him as should all sleep patients.   Total sleep time was reduced at 89.5 minutes.  Sleep efficiency was decreased at 22.3%.  RECOMMENDATIONS: Melvyn Novas, MD  General Information Name: Frank, Berg BMI: 21.44 Physician: Melvyn Novas, MD ID: [id number] Height: 74.0 in Technician: Domingo Cocking, RPSGT Sex: Male Weight: 167.0 lb Record: [id number] Age: 67 [1956-07-05] Date: 07/09/2023   Medical & Medication History per Butch Penny, NP    Mr. Skeet is a 67 year old male with a history of obstructive sleep apnea on CPAP. He reports that he has been having shortness of breath. He is seeing cardiologist for heart failure. He reports that they are adjusting his medications. He states that night he feels that he is not getting enough pressure when he puts the mask on. Wife reports that he does need a new machine. Zyloprim, colchicine, Jardiance, Ensure Max  Protein, iron, Claritin, Proamatine, Remeron, Multivitamins w/minerals, Xarelto, Crestor, Adcirca, Vyndamax, Demadex, Ambien  Sleep Disorder    Comments  Patient arrived for a diagnostic polysomnogram. Procedure explained and all questions answered. Standard paste setup without complications. Patient sleep efficiency was greatly reduced at 22.32%. After 2.5 hours recording time, patient's total sleep time = 22 fragmented minutes. Patient slept supine. Mild snoring heard. Respiratory events observed, many central events as well as mixed events. Cardiac arrhythmias observed. Patient has a known cardiac history. No significant PLMS observed. No restroom visits for nocturia, as patient is using a [place] catheter.   Lights out: 10:11:23 PM Lights on: 04:52:25 AM Time Total Supine Side Prone Upright Recording (TRT) 6h 41.15m 6h 33.76m 0h 7.48m 0h 0.68m 0h 0.39m Sleep (TST) 1h 29.30m 1h 29.55m 0h 0.74m 0h 0.37m 0h 0.40m Latency N1 N2 N3 REM Onset Per. Slp. Eff. Actual 1h 3.53m 2h 2.46m 0h 0.63m 0h 0.60m 1h 3.24m 2h 3.56m 22.32% Stg Dur Wake N1 N2 N3 REM Total 232.0 52.5 37.0 0.0 0.0 Supine 232.0 52.5 37.0 0.0 0.0 Side 0.0 0.0 0.0 0.0 0.0 Prone 0.0 0.0 0.0 0.0 0.0 Upright 0.0 0.0 0.0 0.0 0.0  Stg % Wake N1 N2 N3 REM Total 72.2 58.7 41.3 0.0 0.0 Supine 72.2 58.7 41.3 0.0 0.0 Side 0.0 0.0 0.0 0.0 0.0 Prone 0.0 0.0 0.0 0.0 0.0 Upright 0.0 0.0 0.0 0.0 0.0  Apnea Summary Sub Supine Side Prone Upright Total 66 Total 66 66 0 0 0   REM 0 0 0 0 0   NREM 66 66 0 0 0 Obs 0 REM 0 0 0 0 0   NREM 0 0 0 0 0 Mix 16 REM 0 0 0 0 0   NREM 16 16 0 0 0 Cen 50 REM 0 0 0 0 0   NREM 50 50 0 0 0 Rera Summary Sub Supine Side Prone Upright Total 0 Total 0 0 0 0 0   REM 0 0 0 0 0   NREM 0 0 0 0 0  Hypopnea Summary Sub Supine Side Prone Upright Total 8 Total 8 8 0 0 0   REM 0 0 0 0 0   NREM 8 8  and VIDEO: IMPRESSION: CENTRAL Sleep disordered breathing was present. The patient was unable to sleep (in spite of Ambien?)  This study was ordered as a PSG, so a titration was not attempted. The patient will likely need at least BiPAP ST, and most likely ASV with this proportion of central apnea.  I ordered a return study (in-lab) for this special titration. The patient will need to bring his sleep aid with him as should all sleep patients.   Total sleep time was reduced at 89.5 minutes.  Sleep efficiency was decreased at 22.3%.  RECOMMENDATIONS: Melvyn Novas, MD  General Information Name: Frank, Berg BMI: 21.44 Physician: Melvyn Novas, MD ID: [id number] Height: 74.0 in Technician: Domingo Cocking, RPSGT Sex: Male Weight: 167.0 lb Record: [id number] Age: 67 [1956-07-05] Date: 07/09/2023   Medical & Medication History per Butch Penny, NP    Mr. Skeet is a 67 year old male with a history of obstructive sleep apnea on CPAP. He reports that he has been having shortness of breath. He is seeing cardiologist for heart failure. He reports that they are adjusting his medications. He states that night he feels that he is not getting enough pressure when he puts the mask on. Wife reports that he does need a new machine. Zyloprim, colchicine, Jardiance, Ensure Max  Protein, iron, Claritin, Proamatine, Remeron, Multivitamins w/minerals, Xarelto, Crestor, Adcirca, Vyndamax, Demadex, Ambien  Sleep Disorder    Comments  Patient arrived for a diagnostic polysomnogram. Procedure explained and all questions answered. Standard paste setup without complications. Patient sleep efficiency was greatly reduced at 22.32%. After 2.5 hours recording time, patient's total sleep time = 22 fragmented minutes. Patient slept supine. Mild snoring heard. Respiratory events observed, many central events as well as mixed events. Cardiac arrhythmias observed. Patient has a known cardiac history. No significant PLMS observed. No restroom visits for nocturia, as patient is using a [place] catheter.   Lights out: 10:11:23 PM Lights on: 04:52:25 AM Time Total Supine Side Prone Upright Recording (TRT) 6h 41.15m 6h 33.76m 0h 7.48m 0h 0.68m 0h 0.39m Sleep (TST) 1h 29.30m 1h 29.55m 0h 0.74m 0h 0.37m 0h 0.40m Latency N1 N2 N3 REM Onset Per. Slp. Eff. Actual 1h 3.53m 2h 2.46m 0h 0.63m 0h 0.60m 1h 3.24m 2h 3.56m 22.32% Stg Dur Wake N1 N2 N3 REM Total 232.0 52.5 37.0 0.0 0.0 Supine 232.0 52.5 37.0 0.0 0.0 Side 0.0 0.0 0.0 0.0 0.0 Prone 0.0 0.0 0.0 0.0 0.0 Upright 0.0 0.0 0.0 0.0 0.0  Stg % Wake N1 N2 N3 REM Total 72.2 58.7 41.3 0.0 0.0 Supine 72.2 58.7 41.3 0.0 0.0 Side 0.0 0.0 0.0 0.0 0.0 Prone 0.0 0.0 0.0 0.0 0.0 Upright 0.0 0.0 0.0 0.0 0.0  Apnea Summary Sub Supine Side Prone Upright Total 66 Total 66 66 0 0 0   REM 0 0 0 0 0   NREM 66 66 0 0 0 Obs 0 REM 0 0 0 0 0   NREM 0 0 0 0 0 Mix 16 REM 0 0 0 0 0   NREM 16 16 0 0 0 Cen 50 REM 0 0 0 0 0   NREM 50 50 0 0 0 Rera Summary Sub Supine Side Prone Upright Total 0 Total 0 0 0 0 0   REM 0 0 0 0 0   NREM 0 0 0 0 0  Hypopnea Summary Sub Supine Side Prone Upright Total 8 Total 8 8 0 0 0   REM 0 0 0 0 0   NREM 8 8  [facility] POLYSOMNOGRAPHY  INTERPRETATION REPORT STUDY DATE:  07/09/2023  PATIENT NAME:  Frank Berg        DATE OF BIRTH:  1956-10-22 PATIENT ID:  [id number]    TYPE OF STUDY:  PSG READING PHYSICIAN: Melvyn Novas, MD REFERRED BY: Ursula Alert, NP PCP :  Dr Evlyn Kanner Neurologist : DR Hampton Abbot Cardiologist: Dr Milas Kocher  SCORING TECHNICIAN: Domingo Cocking, RPSGT HISTORY:  Frank Berg is 67 year-old Male patient on CPAP who feels that his PAP machine is not longer providing enough pressure. This patient was seen by Lamont Dowdy, NP, who ordered this study as a SPLIT on 06-12-2023. There was not enough sleep time to split this study- New machine needed by date of issue. This patient's medication list includes Ambien.  This patient carries a dx of amyloid neuropathy, followed by specialist clinic at [facility], Neuromuscular division. 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 Gala Romney) for cardiomyopathy. Remains on Tafamidis ADDITIONAL INFORMATION:  The Epworth Sleepiness Scale endorsed at 4 /24 points (scores above or equal to 10 are suggestive of hypersomnolence). Height: 74 in Weight: 167 lb (BMI 21) Neck Size: 0 in  TECHNICAL DESCRIPTION: A registered sleep technologist ( RPSGT)  was in attendance for the duration of the recording.  Data collection, scoring, video monitoring, and reporting were performed in compliance with the AASM Manual for the Scoring of Sleep and Associated Events; (Hypopnea is scored based on the criteria listed in Section VIII D. 1b in the AASM Manual V2.6 using a 4% oxygen desaturation rule or Hypopnea is scored based on the criteria listed in Section VIII D. 1a in the AASM Manual V2.6 using 3% oxygen desaturation and /or arousal rule). SLEEP CONTINUITY AND SLEEP ARCHITECTURE:  Lights-out was at 22:11: and lights-on at  04:52:, with  6.7 hours of recording time . Total sleep time ( TST) was 89.5 minutes with a decreased sleep efficiency at 22.3%. BODY POSITION:  TST was 100.0% supine. Sleep latency was increased at 63.0 minutes.  REM sleep latency was decreased at 0.0 minutes. Of the total sleep time, the percentage of stage N1 sleep was 58.7%, stage N2 sleep was 41%, stage N3 sleep was 0.0%, and REM sleep was 0.0%. RESPIRATORY MONITORING: Based on CMS criteria (using a 4% oxygen desaturation rule for scoring hypopneas), there were 66 apneas (0 obstructive; 50 central; 16 mixed), and 3 hypopneas.  Apnea index was 44.2. Hypopnea index was 2.0. The apnea-hypopnea index was 46.3 overall (46.3 supine, 0 non-supine; 0.0 REM, 0.0 supine REM). OXIMETRY: Oxyhemoglobin Saturation Nadir during sleep was at  90%) from a mean of 99%.  Of the  Total sleep time (TST)  hypoxemia (=<88%) was present for  0.0 minutes, or 0.0% of total sleep time. LIMB MOVEMENTS: There were 4 periodic limb movements of sleep (2.7/hr), of which 1 (0.7/hr) were associated with an arousal. AROUSAL: There were 69 arousals in total, for an arousal index of 46 arousals/hour.  Of these, 51 were identified as respiratory-related arousals (34 /h), 1 were PLM-related arousals (1 /h), and 37 were non-specific arousals (25 /h). EEG:  PSG EEG was of normal amplitude and frequency, with symmetric manifestation of sleep stages. EKG: The electrocardiogram documented irregular shaped QRS complexes and R to R intervals. AUDIO  and VIDEO: IMPRESSION: CENTRAL Sleep disordered breathing was present. The patient was unable to sleep (in spite of Ambien?)  This study was ordered as a PSG, so a titration was not attempted. The patient will likely need at least BiPAP ST, and most likely ASV with this proportion of central apnea.  I ordered a return study (in-lab) for this special titration. The patient will need to bring his sleep aid with him as should all sleep patients.   Total sleep time was reduced at 89.5 minutes.  Sleep efficiency was decreased at 22.3%.  RECOMMENDATIONS: Melvyn Novas, MD  General Information Name: Frank, Berg BMI: 21.44 Physician: Melvyn Novas, MD ID: [id number] Height: 74.0 in Technician: Domingo Cocking, RPSGT Sex: Male Weight: 167.0 lb Record: [id number] Age: 67 [1956-07-05] Date: 07/09/2023   Medical & Medication History per Butch Penny, NP    Mr. Skeet is a 67 year old male with a history of obstructive sleep apnea on CPAP. He reports that he has been having shortness of breath. He is seeing cardiologist for heart failure. He reports that they are adjusting his medications. He states that night he feels that he is not getting enough pressure when he puts the mask on. Wife reports that he does need a new machine. Zyloprim, colchicine, Jardiance, Ensure Max  Protein, iron, Claritin, Proamatine, Remeron, Multivitamins w/minerals, Xarelto, Crestor, Adcirca, Vyndamax, Demadex, Ambien  Sleep Disorder    Comments  Patient arrived for a diagnostic polysomnogram. Procedure explained and all questions answered. Standard paste setup without complications. Patient sleep efficiency was greatly reduced at 22.32%. After 2.5 hours recording time, patient's total sleep time = 22 fragmented minutes. Patient slept supine. Mild snoring heard. Respiratory events observed, many central events as well as mixed events. Cardiac arrhythmias observed. Patient has a known cardiac history. No significant PLMS observed. No restroom visits for nocturia, as patient is using a [place] catheter.   Lights out: 10:11:23 PM Lights on: 04:52:25 AM Time Total Supine Side Prone Upright Recording (TRT) 6h 41.15m 6h 33.76m 0h 7.48m 0h 0.68m 0h 0.39m Sleep (TST) 1h 29.30m 1h 29.55m 0h 0.74m 0h 0.37m 0h 0.40m Latency N1 N2 N3 REM Onset Per. Slp. Eff. Actual 1h 3.53m 2h 2.46m 0h 0.63m 0h 0.60m 1h 3.24m 2h 3.56m 22.32% Stg Dur Wake N1 N2 N3 REM Total 232.0 52.5 37.0 0.0 0.0 Supine 232.0 52.5 37.0 0.0 0.0 Side 0.0 0.0 0.0 0.0 0.0 Prone 0.0 0.0 0.0 0.0 0.0 Upright 0.0 0.0 0.0 0.0 0.0  Stg % Wake N1 N2 N3 REM Total 72.2 58.7 41.3 0.0 0.0 Supine 72.2 58.7 41.3 0.0 0.0 Side 0.0 0.0 0.0 0.0 0.0 Prone 0.0 0.0 0.0 0.0 0.0 Upright 0.0 0.0 0.0 0.0 0.0  Apnea Summary Sub Supine Side Prone Upright Total 66 Total 66 66 0 0 0   REM 0 0 0 0 0   NREM 66 66 0 0 0 Obs 0 REM 0 0 0 0 0   NREM 0 0 0 0 0 Mix 16 REM 0 0 0 0 0   NREM 16 16 0 0 0 Cen 50 REM 0 0 0 0 0   NREM 50 50 0 0 0 Rera Summary Sub Supine Side Prone Upright Total 0 Total 0 0 0 0 0   REM 0 0 0 0 0   NREM 0 0 0 0 0  Hypopnea Summary Sub Supine Side Prone Upright Total 8 Total 8 8 0 0 0   REM 0 0 0 0 0   NREM 8 8  and VIDEO: IMPRESSION: CENTRAL Sleep disordered breathing was present. The patient was unable to sleep (in spite of Ambien?)  This study was ordered as a PSG, so a titration was not attempted. The patient will likely need at least BiPAP ST, and most likely ASV with this proportion of central apnea.  I ordered a return study (in-lab) for this special titration. The patient will need to bring his sleep aid with him as should all sleep patients.   Total sleep time was reduced at 89.5 minutes.  Sleep efficiency was decreased at 22.3%.  RECOMMENDATIONS: Melvyn Novas, MD  General Information Name: Frank, Berg BMI: 21.44 Physician: Melvyn Novas, MD ID: [id number] Height: 74.0 in Technician: Domingo Cocking, RPSGT Sex: Male Weight: 167.0 lb Record: [id number] Age: 67 [1956-07-05] Date: 07/09/2023   Medical & Medication History per Butch Penny, NP    Mr. Skeet is a 67 year old male with a history of obstructive sleep apnea on CPAP. He reports that he has been having shortness of breath. He is seeing cardiologist for heart failure. He reports that they are adjusting his medications. He states that night he feels that he is not getting enough pressure when he puts the mask on. Wife reports that he does need a new machine. Zyloprim, colchicine, Jardiance, Ensure Max  Protein, iron, Claritin, Proamatine, Remeron, Multivitamins w/minerals, Xarelto, Crestor, Adcirca, Vyndamax, Demadex, Ambien  Sleep Disorder    Comments  Patient arrived for a diagnostic polysomnogram. Procedure explained and all questions answered. Standard paste setup without complications. Patient sleep efficiency was greatly reduced at 22.32%. After 2.5 hours recording time, patient's total sleep time = 22 fragmented minutes. Patient slept supine. Mild snoring heard. Respiratory events observed, many central events as well as mixed events. Cardiac arrhythmias observed. Patient has a known cardiac history. No significant PLMS observed. No restroom visits for nocturia, as patient is using a [place] catheter.   Lights out: 10:11:23 PM Lights on: 04:52:25 AM Time Total Supine Side Prone Upright Recording (TRT) 6h 41.15m 6h 33.76m 0h 7.48m 0h 0.68m 0h 0.39m Sleep (TST) 1h 29.30m 1h 29.55m 0h 0.74m 0h 0.37m 0h 0.40m Latency N1 N2 N3 REM Onset Per. Slp. Eff. Actual 1h 3.53m 2h 2.46m 0h 0.63m 0h 0.60m 1h 3.24m 2h 3.56m 22.32% Stg Dur Wake N1 N2 N3 REM Total 232.0 52.5 37.0 0.0 0.0 Supine 232.0 52.5 37.0 0.0 0.0 Side 0.0 0.0 0.0 0.0 0.0 Prone 0.0 0.0 0.0 0.0 0.0 Upright 0.0 0.0 0.0 0.0 0.0  Stg % Wake N1 N2 N3 REM Total 72.2 58.7 41.3 0.0 0.0 Supine 72.2 58.7 41.3 0.0 0.0 Side 0.0 0.0 0.0 0.0 0.0 Prone 0.0 0.0 0.0 0.0 0.0 Upright 0.0 0.0 0.0 0.0 0.0  Apnea Summary Sub Supine Side Prone Upright Total 66 Total 66 66 0 0 0   REM 0 0 0 0 0   NREM 66 66 0 0 0 Obs 0 REM 0 0 0 0 0   NREM 0 0 0 0 0 Mix 16 REM 0 0 0 0 0   NREM 16 16 0 0 0 Cen 50 REM 0 0 0 0 0   NREM 50 50 0 0 0 Rera Summary Sub Supine Side Prone Upright Total 0 Total 0 0 0 0 0   REM 0 0 0 0 0   NREM 0 0 0 0 0  Hypopnea Summary Sub Supine Side Prone Upright Total 8 Total 8 8 0 0 0   REM 0 0 0 0 0   NREM 8 8  TYSON PARKISON ZOX:[id number] DOB: 1956/07/30 DOA: 07/18/2023  PCP: Adrian Prince, MD  Admit date: 07/18/2023  Discharge date: 07/24/2023  Admitted From: Home   Disposition:  Home Hospice   Recommendations for Outpatient Follow-up:   Follow up with PCP in 1-2 weeks  PCP Please obtain BMP/CBC, 2 view CXR in 1week,  (see Discharge instructions)   PCP Please follow up on the following pending results:     Home Health: None   Equipment/Devices: None  Consultations: GI, Renal, PCCM, Pall Care Discharge Condition: Guarded  CODE STATUS: DNR  Diet Recommendation: Heart Healthy     Chief Complaint  Patient presents with   Hypotension   Dizziness     Brief history of present illness from the day of admission and additional interim summary    67 year old male with history of end-stage liver cirrhosis , ischemic cardiomyopathy, cardiac amyloidosis, A-fib on chronic anticoagulation, type 2 diabetes, sleep apnea on CPAP, CKD stage IIIb, recurrent ascites needing paracentesis who was recently hospitalized for GI bleed workup  and was discharged on 07/17/2023 presented with lightheadedness, weakness, syncopal episode.  He was hypotensive on presentation.  Lab work showed sodium of 125, creatinine of 2.1, hemoglobin of 7.1, INR of 3.9.  CTA abdomen did not show any acute bleed but showed intramuscular hematoma of left rectus abdominis which was diagnosed on last admission.   He was seen by GI, PCCM for hypotension, anemia, worsening ascites in the setting of end-stage liver cirrhosis, hyponatremia, possible hepatorenal syndrome.  Transferred to my care on 07/20/2023.                                                                 Hospital Course   Acute blood loss anemia due to intra-abdominal muscle hematoma: Patient  admitted for GI bleed and was discharged on 07/17/2023.  On his last admission, GI following, EGD was not done, treated with Protonix infusion, octreotide and was discharged on PPI twice daily, Xarelto was resumed for A-fib.  He was noted to have intramuscular hematoma of rectus abdominis at that time as well.     Patient presented with lightheadedness.  Found to have acute blood loss anemia with hemoglobin in the range of 7.  Transferred with 2 units of PRBC.  This morning hemoglobin stable in the range of 9.  Xarelto on hold.  Patient mentioned seeing darker stool a day before admission.was seen by GI team this admission again, Xarelto was held, he received vitamin K and Kcentra for Xarelto reversal, on PPI.  Currently CBC has stabilized, continue to monitor on PPI, continue to hold Xarelto which I agree with, acetic fluid still bloody on 07/23/2023.  Now being transitioned to comfort care.  Will be discharged home with home hospice.     End-stage liver cirrhosis likely  [facility] POLYSOMNOGRAPHY  INTERPRETATION REPORT STUDY DATE:  07/09/2023  PATIENT NAME:  Frank Berg        DATE OF BIRTH:  1956-10-22 PATIENT ID:  [id number]    TYPE OF STUDY:  PSG READING PHYSICIAN: Melvyn Novas, MD REFERRED BY: Ursula Alert, NP PCP :  Dr Evlyn Kanner Neurologist : DR Hampton Abbot Cardiologist: Dr Milas Kocher  SCORING TECHNICIAN: Domingo Cocking, RPSGT HISTORY:  Frank Berg is 67 year-old Male patient on CPAP who feels that his PAP machine is not longer providing enough pressure. This patient was seen by Lamont Dowdy, NP, who ordered this study as a SPLIT on 06-12-2023. There was not enough sleep time to split this study- New machine needed by date of issue. This patient's medication list includes Ambien.  This patient carries a dx of amyloid neuropathy, followed by specialist clinic at [facility], Neuromuscular division. 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 Gala Romney) for cardiomyopathy. Remains on Tafamidis ADDITIONAL INFORMATION:  The Epworth Sleepiness Scale endorsed at 4 /24 points (scores above or equal to 10 are suggestive of hypersomnolence). Height: 74 in Weight: 167 lb (BMI 21) Neck Size: 0 in  TECHNICAL DESCRIPTION: A registered sleep technologist ( RPSGT)  was in attendance for the duration of the recording.  Data collection, scoring, video monitoring, and reporting were performed in compliance with the AASM Manual for the Scoring of Sleep and Associated Events; (Hypopnea is scored based on the criteria listed in Section VIII D. 1b in the AASM Manual V2.6 using a 4% oxygen desaturation rule or Hypopnea is scored based on the criteria listed in Section VIII D. 1a in the AASM Manual V2.6 using 3% oxygen desaturation and /or arousal rule). SLEEP CONTINUITY AND SLEEP ARCHITECTURE:  Lights-out was at 22:11: and lights-on at  04:52:, with  6.7 hours of recording time . Total sleep time ( TST) was 89.5 minutes with a decreased sleep efficiency at 22.3%. BODY POSITION:  TST was 100.0% supine. Sleep latency was increased at 63.0 minutes.  REM sleep latency was decreased at 0.0 minutes. Of the total sleep time, the percentage of stage N1 sleep was 58.7%, stage N2 sleep was 41%, stage N3 sleep was 0.0%, and REM sleep was 0.0%. RESPIRATORY MONITORING: Based on CMS criteria (using a 4% oxygen desaturation rule for scoring hypopneas), there were 66 apneas (0 obstructive; 50 central; 16 mixed), and 3 hypopneas.  Apnea index was 44.2. Hypopnea index was 2.0. The apnea-hypopnea index was 46.3 overall (46.3 supine, 0 non-supine; 0.0 REM, 0.0 supine REM). OXIMETRY: Oxyhemoglobin Saturation Nadir during sleep was at  90%) from a mean of 99%.  Of the  Total sleep time (TST)  hypoxemia (=<88%) was present for  0.0 minutes, or 0.0% of total sleep time. LIMB MOVEMENTS: There were 4 periodic limb movements of sleep (2.7/hr), of which 1 (0.7/hr) were associated with an arousal. AROUSAL: There were 69 arousals in total, for an arousal index of 46 arousals/hour.  Of these, 51 were identified as respiratory-related arousals (34 /h), 1 were PLM-related arousals (1 /h), and 37 were non-specific arousals (25 /h). EEG:  PSG EEG was of normal amplitude and frequency, with symmetric manifestation of sleep stages. EKG: The electrocardiogram documented irregular shaped QRS complexes and R to R intervals. AUDIO  [facility] POLYSOMNOGRAPHY  INTERPRETATION REPORT STUDY DATE:  07/09/2023  PATIENT NAME:  Frank Berg        DATE OF BIRTH:  1956-10-22 PATIENT ID:  [id number]    TYPE OF STUDY:  PSG READING PHYSICIAN: Melvyn Novas, MD REFERRED BY: Ursula Alert, NP PCP :  Dr Evlyn Kanner Neurologist : DR Hampton Abbot Cardiologist: Dr Milas Kocher  SCORING TECHNICIAN: Domingo Cocking, RPSGT HISTORY:  Frank Berg is 67 year-old Male patient on CPAP who feels that his PAP machine is not longer providing enough pressure. This patient was seen by Lamont Dowdy, NP, who ordered this study as a SPLIT on 06-12-2023. There was not enough sleep time to split this study- New machine needed by date of issue. This patient's medication list includes Ambien.  This patient carries a dx of amyloid neuropathy, followed by specialist clinic at [facility], Neuromuscular division. 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 Gala Romney) for cardiomyopathy. Remains on Tafamidis ADDITIONAL INFORMATION:  The Epworth Sleepiness Scale endorsed at 4 /24 points (scores above or equal to 10 are suggestive of hypersomnolence). Height: 74 in Weight: 167 lb (BMI 21) Neck Size: 0 in  TECHNICAL DESCRIPTION: A registered sleep technologist ( RPSGT)  was in attendance for the duration of the recording.  Data collection, scoring, video monitoring, and reporting were performed in compliance with the AASM Manual for the Scoring of Sleep and Associated Events; (Hypopnea is scored based on the criteria listed in Section VIII D. 1b in the AASM Manual V2.6 using a 4% oxygen desaturation rule or Hypopnea is scored based on the criteria listed in Section VIII D. 1a in the AASM Manual V2.6 using 3% oxygen desaturation and /or arousal rule). SLEEP CONTINUITY AND SLEEP ARCHITECTURE:  Lights-out was at 22:11: and lights-on at  04:52:, with  6.7 hours of recording time . Total sleep time ( TST) was 89.5 minutes with a decreased sleep efficiency at 22.3%. BODY POSITION:  TST was 100.0% supine. Sleep latency was increased at 63.0 minutes.  REM sleep latency was decreased at 0.0 minutes. Of the total sleep time, the percentage of stage N1 sleep was 58.7%, stage N2 sleep was 41%, stage N3 sleep was 0.0%, and REM sleep was 0.0%. RESPIRATORY MONITORING: Based on CMS criteria (using a 4% oxygen desaturation rule for scoring hypopneas), there were 66 apneas (0 obstructive; 50 central; 16 mixed), and 3 hypopneas.  Apnea index was 44.2. Hypopnea index was 2.0. The apnea-hypopnea index was 46.3 overall (46.3 supine, 0 non-supine; 0.0 REM, 0.0 supine REM). OXIMETRY: Oxyhemoglobin Saturation Nadir during sleep was at  90%) from a mean of 99%.  Of the  Total sleep time (TST)  hypoxemia (=<88%) was present for  0.0 minutes, or 0.0% of total sleep time. LIMB MOVEMENTS: There were 4 periodic limb movements of sleep (2.7/hr), of which 1 (0.7/hr) were associated with an arousal. AROUSAL: There were 69 arousals in total, for an arousal index of 46 arousals/hour.  Of these, 51 were identified as respiratory-related arousals (34 /h), 1 were PLM-related arousals (1 /h), and 37 were non-specific arousals (25 /h). EEG:  PSG EEG was of normal amplitude and frequency, with symmetric manifestation of sleep stages. EKG: The electrocardiogram documented irregular shaped QRS complexes and R to R intervals. AUDIO  and VIDEO: IMPRESSION: CENTRAL Sleep disordered breathing was present. The patient was unable to sleep (in spite of Ambien?)  This study was ordered as a PSG, so a titration was not attempted. The patient will likely need at least BiPAP ST, and most likely ASV with this proportion of central apnea.  I ordered a return study (in-lab) for this special titration. The patient will need to bring his sleep aid with him as should all sleep patients.   Total sleep time was reduced at 89.5 minutes.  Sleep efficiency was decreased at 22.3%.  RECOMMENDATIONS: Melvyn Novas, MD  General Information Name: Frank, Berg BMI: 21.44 Physician: Melvyn Novas, MD ID: [id number] Height: 74.0 in Technician: Domingo Cocking, RPSGT Sex: Male Weight: 167.0 lb Record: [id number] Age: 67 [1956-07-05] Date: 07/09/2023   Medical & Medication History per Butch Penny, NP    Mr. Skeet is a 67 year old male with a history of obstructive sleep apnea on CPAP. He reports that he has been having shortness of breath. He is seeing cardiologist for heart failure. He reports that they are adjusting his medications. He states that night he feels that he is not getting enough pressure when he puts the mask on. Wife reports that he does need a new machine. Zyloprim, colchicine, Jardiance, Ensure Max  Protein, iron, Claritin, Proamatine, Remeron, Multivitamins w/minerals, Xarelto, Crestor, Adcirca, Vyndamax, Demadex, Ambien  Sleep Disorder    Comments  Patient arrived for a diagnostic polysomnogram. Procedure explained and all questions answered. Standard paste setup without complications. Patient sleep efficiency was greatly reduced at 22.32%. After 2.5 hours recording time, patient's total sleep time = 22 fragmented minutes. Patient slept supine. Mild snoring heard. Respiratory events observed, many central events as well as mixed events. Cardiac arrhythmias observed. Patient has a known cardiac history. No significant PLMS observed. No restroom visits for nocturia, as patient is using a [place] catheter.   Lights out: 10:11:23 PM Lights on: 04:52:25 AM Time Total Supine Side Prone Upright Recording (TRT) 6h 41.15m 6h 33.76m 0h 7.48m 0h 0.68m 0h 0.39m Sleep (TST) 1h 29.30m 1h 29.55m 0h 0.74m 0h 0.37m 0h 0.40m Latency N1 N2 N3 REM Onset Per. Slp. Eff. Actual 1h 3.53m 2h 2.46m 0h 0.63m 0h 0.60m 1h 3.24m 2h 3.56m 22.32% Stg Dur Wake N1 N2 N3 REM Total 232.0 52.5 37.0 0.0 0.0 Supine 232.0 52.5 37.0 0.0 0.0 Side 0.0 0.0 0.0 0.0 0.0 Prone 0.0 0.0 0.0 0.0 0.0 Upright 0.0 0.0 0.0 0.0 0.0  Stg % Wake N1 N2 N3 REM Total 72.2 58.7 41.3 0.0 0.0 Supine 72.2 58.7 41.3 0.0 0.0 Side 0.0 0.0 0.0 0.0 0.0 Prone 0.0 0.0 0.0 0.0 0.0 Upright 0.0 0.0 0.0 0.0 0.0  Apnea Summary Sub Supine Side Prone Upright Total 66 Total 66 66 0 0 0   REM 0 0 0 0 0   NREM 66 66 0 0 0 Obs 0 REM 0 0 0 0 0   NREM 0 0 0 0 0 Mix 16 REM 0 0 0 0 0   NREM 16 16 0 0 0 Cen 50 REM 0 0 0 0 0   NREM 50 50 0 0 0 Rera Summary Sub Supine Side Prone Upright Total 0 Total 0 0 0 0 0   REM 0 0 0 0 0   NREM 0 0 0 0 0  Hypopnea Summary Sub Supine Side Prone Upright Total 8 Total 8 8 0 0 0   REM 0 0 0 0 0   NREM 8 8  [facility] POLYSOMNOGRAPHY  INTERPRETATION REPORT STUDY DATE:  07/09/2023  PATIENT NAME:  Frank Berg        DATE OF BIRTH:  1956-10-22 PATIENT ID:  [id number]    TYPE OF STUDY:  PSG READING PHYSICIAN: Melvyn Novas, MD REFERRED BY: Ursula Alert, NP PCP :  Dr Evlyn Kanner Neurologist : DR Hampton Abbot Cardiologist: Dr Milas Kocher  SCORING TECHNICIAN: Domingo Cocking, RPSGT HISTORY:  Frank Berg is 67 year-old Male patient on CPAP who feels that his PAP machine is not longer providing enough pressure. This patient was seen by Lamont Dowdy, NP, who ordered this study as a SPLIT on 06-12-2023. There was not enough sleep time to split this study- New machine needed by date of issue. This patient's medication list includes Ambien.  This patient carries a dx of amyloid neuropathy, followed by specialist clinic at [facility], Neuromuscular division. 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 Gala Romney) for cardiomyopathy. Remains on Tafamidis ADDITIONAL INFORMATION:  The Epworth Sleepiness Scale endorsed at 4 /24 points (scores above or equal to 10 are suggestive of hypersomnolence). Height: 74 in Weight: 167 lb (BMI 21) Neck Size: 0 in  TECHNICAL DESCRIPTION: A registered sleep technologist ( RPSGT)  was in attendance for the duration of the recording.  Data collection, scoring, video monitoring, and reporting were performed in compliance with the AASM Manual for the Scoring of Sleep and Associated Events; (Hypopnea is scored based on the criteria listed in Section VIII D. 1b in the AASM Manual V2.6 using a 4% oxygen desaturation rule or Hypopnea is scored based on the criteria listed in Section VIII D. 1a in the AASM Manual V2.6 using 3% oxygen desaturation and /or arousal rule). SLEEP CONTINUITY AND SLEEP ARCHITECTURE:  Lights-out was at 22:11: and lights-on at  04:52:, with  6.7 hours of recording time . Total sleep time ( TST) was 89.5 minutes with a decreased sleep efficiency at 22.3%. BODY POSITION:  TST was 100.0% supine. Sleep latency was increased at 63.0 minutes.  REM sleep latency was decreased at 0.0 minutes. Of the total sleep time, the percentage of stage N1 sleep was 58.7%, stage N2 sleep was 41%, stage N3 sleep was 0.0%, and REM sleep was 0.0%. RESPIRATORY MONITORING: Based on CMS criteria (using a 4% oxygen desaturation rule for scoring hypopneas), there were 66 apneas (0 obstructive; 50 central; 16 mixed), and 3 hypopneas.  Apnea index was 44.2. Hypopnea index was 2.0. The apnea-hypopnea index was 46.3 overall (46.3 supine, 0 non-supine; 0.0 REM, 0.0 supine REM). OXIMETRY: Oxyhemoglobin Saturation Nadir during sleep was at  90%) from a mean of 99%.  Of the  Total sleep time (TST)  hypoxemia (=<88%) was present for  0.0 minutes, or 0.0% of total sleep time. LIMB MOVEMENTS: There were 4 periodic limb movements of sleep (2.7/hr), of which 1 (0.7/hr) were associated with an arousal. AROUSAL: There were 69 arousals in total, for an arousal index of 46 arousals/hour.  Of these, 51 were identified as respiratory-related arousals (34 /h), 1 were PLM-related arousals (1 /h), and 37 were non-specific arousals (25 /h). EEG:  PSG EEG was of normal amplitude and frequency, with symmetric manifestation of sleep stages. EKG: The electrocardiogram documented irregular shaped QRS complexes and R to R intervals. AUDIO

## 2023-07-24 NOTE — Discharge Instructions (Signed)
  Follow with Primary MD Adrian Prince, MD in 7 - 10 days if desired  Get CBC, CMP, magnesium, 2 view Chest X ray -  checked next visit with your primary MD if desired  Activity: As tolerated with Full fall precautions use walker/cane & assistance as needed  Disposition Home   Diet: Heart Healthy with 1.2 L fluid restriction per day  Special Instructions: If you have smoked or chewed Tobacco  in the last 2 yrs please stop smoking, stop any regular Alcohol  and or any Recreational drug use.  On your next visit with your primary care physician please Get Medicines reviewed and adjusted.  Please request your Prim.MD to go over all Hospital Tests and Procedure/Radiological results at the follow up, please get all Hospital records sent to your Prim MD by signing hospital release before you go home.  If you experience worsening of your admission symptoms, develop shortness of breath, life threatening emergency, suicidal or homicidal thoughts you must seek medical attention immediately by calling 911 or calling your MD immediately  if symptoms less severe.  You Must read complete instructions/literature along with all the possible adverse reactions/side effects for all the Medicines you take and that have been prescribed to you. Take any new Medicines after you have completely understood and accpet all the possible adverse reactions/side effects.

## 2023-07-25 ENCOUNTER — Telehealth: Payer: Self-pay | Admitting: *Deleted

## 2023-07-25 NOTE — Telephone Encounter (Signed)
I called pt and relayed that per Dr. Vickey Huger and Butch Penny, NP that it did show that he had central sleep apnea and an order for bipap titration.  The sleep lab is working on this authorization and will be calling to set up the bipap titration.  He verbalized understanding.

## 2023-07-25 NOTE — Telephone Encounter (Signed)
-----   Message from Butch Penny sent at 07/25/2023  1:26 PM EDT ----- Per report from Dr. Vickey Huger it appears the patient has central sleep apnea.  She has already ordered BiPAP titration.  Please make patient aware

## 2023-07-26 ENCOUNTER — Telehealth: Payer: Self-pay | Admitting: Neurology

## 2023-07-26 LAB — BODY FLUID CULTURE W GRAM STAIN: Culture: NO GROWTH

## 2023-07-26 NOTE — Telephone Encounter (Signed)
LVM for pt to call back to schedule   Orpah Clinton auth: [id number] (exp. 07/26/23 to 01/22/24)

## 2023-07-29 ENCOUNTER — Emergency Department (HOSPITAL_COMMUNITY)

## 2023-07-29 ENCOUNTER — Encounter (HOSPITAL_COMMUNITY): Payer: Self-pay | Admitting: *Deleted

## 2023-07-29 ENCOUNTER — Other Ambulatory Visit: Payer: Self-pay

## 2023-07-29 ENCOUNTER — Inpatient Hospital Stay (HOSPITAL_COMMUNITY)
Admission: EM | Admit: 2023-07-29 | Discharge: 2023-08-01 | DRG: 432 | Disposition: A | Discharge status: Hospice | Attending: Internal Medicine | Admitting: Internal Medicine

## 2023-07-29 DIAGNOSIS — N400 Enlarged prostate without lower urinary tract symptoms: Secondary | ICD-10-CM | POA: Diagnosis present

## 2023-07-29 DIAGNOSIS — N182 Chronic kidney disease, stage 2 (mild): Secondary | ICD-10-CM | POA: Diagnosis present

## 2023-07-29 DIAGNOSIS — Z87891 Personal history of nicotine dependence: Secondary | ICD-10-CM

## 2023-07-29 DIAGNOSIS — R0602 Shortness of breath: Secondary | ICD-10-CM | POA: Diagnosis not present

## 2023-07-29 DIAGNOSIS — Z803 Family history of malignant neoplasm of breast: Secondary | ICD-10-CM

## 2023-07-29 DIAGNOSIS — E854 Organ-limited amyloidosis: Secondary | ICD-10-CM | POA: Diagnosis present

## 2023-07-29 DIAGNOSIS — G4733 Obstructive sleep apnea (adult) (pediatric): Secondary | ICD-10-CM | POA: Diagnosis present

## 2023-07-29 DIAGNOSIS — Z66 Do not resuscitate: Secondary | ICD-10-CM | POA: Diagnosis present

## 2023-07-29 DIAGNOSIS — Z7901 Long term (current) use of anticoagulants: Secondary | ICD-10-CM

## 2023-07-29 DIAGNOSIS — K219 Gastro-esophageal reflux disease without esophagitis: Secondary | ICD-10-CM | POA: Diagnosis present

## 2023-07-29 DIAGNOSIS — I43 Cardiomyopathy in diseases classified elsewhere: Secondary | ICD-10-CM | POA: Diagnosis present

## 2023-07-29 DIAGNOSIS — E8722 Chronic metabolic acidosis: Secondary | ICD-10-CM | POA: Diagnosis present

## 2023-07-29 DIAGNOSIS — N1832 Chronic kidney disease, stage 3b: Secondary | ICD-10-CM | POA: Diagnosis present

## 2023-07-29 DIAGNOSIS — E875 Hyperkalemia: Secondary | ICD-10-CM | POA: Diagnosis present

## 2023-07-29 DIAGNOSIS — K7460 Unspecified cirrhosis of liver: Principal | ICD-10-CM | POA: Diagnosis present

## 2023-07-29 DIAGNOSIS — I959 Hypotension, unspecified: Secondary | ICD-10-CM | POA: Diagnosis present

## 2023-07-29 DIAGNOSIS — R140 Abdominal distension (gaseous): Secondary | ICD-10-CM | POA: Diagnosis present

## 2023-07-29 DIAGNOSIS — M109 Gout, unspecified: Secondary | ICD-10-CM | POA: Diagnosis present

## 2023-07-29 DIAGNOSIS — E1142 Type 2 diabetes mellitus with diabetic polyneuropathy: Secondary | ICD-10-CM | POA: Diagnosis present

## 2023-07-29 DIAGNOSIS — E785 Hyperlipidemia, unspecified: Secondary | ICD-10-CM | POA: Diagnosis present

## 2023-07-29 DIAGNOSIS — R188 Other ascites: Secondary | ICD-10-CM | POA: Diagnosis present

## 2023-07-29 DIAGNOSIS — E852 Heredofamilial amyloidosis, unspecified: Secondary | ICD-10-CM | POA: Diagnosis present

## 2023-07-29 DIAGNOSIS — Z79899 Other long term (current) drug therapy: Secondary | ICD-10-CM

## 2023-07-29 DIAGNOSIS — Z800 Family history of malignant neoplasm of digestive organs: Secondary | ICD-10-CM

## 2023-07-29 DIAGNOSIS — N401 Enlarged prostate with lower urinary tract symptoms: Secondary | ICD-10-CM | POA: Diagnosis present

## 2023-07-29 DIAGNOSIS — E1122 Type 2 diabetes mellitus with diabetic chronic kidney disease: Secondary | ICD-10-CM | POA: Diagnosis present

## 2023-07-29 DIAGNOSIS — I480 Paroxysmal atrial fibrillation: Secondary | ICD-10-CM | POA: Diagnosis present

## 2023-07-29 DIAGNOSIS — R0609 Other forms of dyspnea: Secondary | ICD-10-CM | POA: Diagnosis not present

## 2023-07-29 DIAGNOSIS — I509 Heart failure, unspecified: Secondary | ICD-10-CM | POA: Diagnosis present

## 2023-07-29 DIAGNOSIS — I2510 Atherosclerotic heart disease of native coronary artery without angina pectoris: Secondary | ICD-10-CM | POA: Diagnosis present

## 2023-07-29 DIAGNOSIS — Z8249 Family history of ischemic heart disease and other diseases of the circulatory system: Secondary | ICD-10-CM

## 2023-07-29 DIAGNOSIS — Z952 Presence of prosthetic heart valve: Secondary | ICD-10-CM

## 2023-07-29 DIAGNOSIS — K7210 Chronic hepatic failure without coma: Secondary | ICD-10-CM | POA: Diagnosis present

## 2023-07-29 DIAGNOSIS — Z833 Family history of diabetes mellitus: Secondary | ICD-10-CM

## 2023-07-29 DIAGNOSIS — I255 Ischemic cardiomyopathy: Secondary | ICD-10-CM | POA: Diagnosis present

## 2023-07-29 DIAGNOSIS — I130 Hypertensive heart and chronic kidney disease with heart failure and stage 1 through stage 4 chronic kidney disease, or unspecified chronic kidney disease: Secondary | ICD-10-CM | POA: Diagnosis present

## 2023-07-29 DIAGNOSIS — Z515 Encounter for palliative care: Secondary | ICD-10-CM

## 2023-07-29 DIAGNOSIS — E871 Hypo-osmolality and hyponatremia: Secondary | ICD-10-CM | POA: Diagnosis present

## 2023-07-29 DIAGNOSIS — Z888 Allergy status to other drugs, medicaments and biological substances status: Secondary | ICD-10-CM

## 2023-07-29 DIAGNOSIS — K767 Hepatorenal syndrome: Secondary | ICD-10-CM | POA: Diagnosis present

## 2023-07-29 DIAGNOSIS — E8770 Fluid overload, unspecified: Secondary | ICD-10-CM | POA: Diagnosis present

## 2023-07-29 DIAGNOSIS — R932 Abnormal findings on diagnostic imaging of liver and biliary tract: Secondary | ICD-10-CM | POA: Diagnosis present

## 2023-07-29 DIAGNOSIS — Z880 Allergy status to penicillin: Secondary | ICD-10-CM

## 2023-07-29 DIAGNOSIS — Z885 Allergy status to narcotic agent status: Secondary | ICD-10-CM

## 2023-07-29 LAB — CBC
HCT: 29.9 % — ABNORMAL LOW (ref 39.0–52.0)
HCT: 29.3 % — ABNORMAL LOW (ref 39.0–52.0)
Hemoglobin: 9.6 g/dL — ABNORMAL LOW (ref 13.0–17.0)
Hemoglobin: 9.8 g/dL — ABNORMAL LOW (ref 13.0–17.0)
MCH: 27.5 pg (ref 26.0–34.0)
MCH: 29 pg (ref 26.0–34.0)
MCHC: 32.1 g/dL (ref 30.0–36.0)
MCHC: 33.4 g/dL (ref 30.0–36.0)
MCV: 85.7 fL (ref 80.0–100.0)
MCV: 86.7 fL (ref 80.0–100.0)
Platelets: 336 10*3/uL (ref 150–400)
Platelets: 339 10*3/uL (ref 150–400)
RBC: 3.49 MIL/uL — ABNORMAL LOW (ref 4.22–5.81)
RBC: 3.38 MIL/uL — ABNORMAL LOW (ref 4.22–5.81)
RDW: 17.3 % — ABNORMAL HIGH (ref 11.5–15.5)
RDW: 17.3 % — ABNORMAL HIGH (ref 11.5–15.5)
WBC: 6.8 10*3/uL (ref 4.0–10.5)
WBC: 6.3 10*3/uL (ref 4.0–10.5)
nRBC: 0 % (ref 0.0–0.2)
nRBC: 0 % (ref 0.0–0.2)

## 2023-07-29 LAB — BASIC METABOLIC PANEL
Anion gap: 9 (ref 5–15)
BUN: 41 mg/dL — ABNORMAL HIGH (ref 8–23)
CO2: 18 mmol/L — ABNORMAL LOW (ref 22–32)
Calcium: 8.2 mg/dL — ABNORMAL LOW (ref 8.9–10.3)
Chloride: 100 mmol/L (ref 98–111)
Creatinine, Ser: 1.66 mg/dL — ABNORMAL HIGH (ref 0.61–1.24)
GFR, Estimated: 44 mL/min — ABNORMAL LOW (ref >60)
Glucose, Bld: 135 mg/dL — ABNORMAL HIGH (ref 70–99)
Potassium: 5.4 mmol/L — ABNORMAL HIGH (ref 3.5–5.1)
Sodium: 127 mmol/L — ABNORMAL LOW (ref 135–145)

## 2023-07-29 LAB — CREATININE, SERUM
Creatinine, Ser: 1.59 mg/dL — ABNORMAL HIGH (ref 0.61–1.24)
GFR, Estimated: 47 mL/min — ABNORMAL LOW (ref >60)

## 2023-07-29 LAB — AMMONIA: Ammonia: 45 umol/L — ABNORMAL HIGH (ref 9–35)

## 2023-07-29 LAB — HEPATIC FUNCTION PANEL
ALT: 10 U/L (ref 0–44)
AST: 43 U/L — ABNORMAL HIGH (ref 15–41)
Albumin: 2.6 g/dL — ABNORMAL LOW (ref 3.5–5.0)
Alkaline Phosphatase: 115 U/L (ref 38–126)
Bilirubin, Direct: 0.6 mg/dL — ABNORMAL HIGH (ref 0.0–0.2)
Indirect Bilirubin: 0.5 mg/dL (ref 0.3–0.9)
Total Bilirubin: 1.1 mg/dL (ref 0.3–1.2)
Total Protein: 7.1 g/dL (ref 6.5–8.1)

## 2023-07-29 LAB — BRAIN NATRIURETIC PEPTIDE: B Natriuretic Peptide: 1029 pg/mL — ABNORMAL HIGH (ref 0.0–100.0)

## 2023-07-29 LAB — TROPONIN I (HIGH SENSITIVITY)
Troponin I (High Sensitivity): 63 ng/L — ABNORMAL HIGH (ref <18)
Troponin I (High Sensitivity): 57 ng/L — ABNORMAL HIGH (ref <18)

## 2023-07-29 LAB — PROTIME-INR
INR: 1.4 — ABNORMAL HIGH (ref 0.8–1.2)
Prothrombin Time: 17.2 s — ABNORMAL HIGH (ref 11.4–15.2)

## 2023-07-29 LAB — MAGNESIUM: Magnesium: 2.1 mg/dL (ref 1.7–2.4)

## 2023-07-29 MED ORDER — FUROSEMIDE 20 MG PO TABS
80.0000 mg | ORAL_TABLET | ORAL | Status: AC
  Administered 2023-07-29: 80 mg via ORAL
  Filled 2023-07-29: qty 4

## 2023-07-29 MED ORDER — HYDROMORPHONE HCL 1 MG/ML PO LIQD
1.0000 mg | Freq: Four times a day (QID) | ORAL | Status: DC | PRN
  Administered 2023-07-31 – 2023-08-01 (×2): 1 mg via ORAL
  Filled 2023-07-29 (×2): qty 1

## 2023-07-29 MED ORDER — TAFAMIDIS 61 MG PO CAPS
1.0000 | ORAL_CAPSULE | Freq: Every day | ORAL | Status: DC
  Administered 2023-07-29 – 2023-08-01 (×4): 61 mg via ORAL
  Filled 2023-07-29 (×4): qty 1

## 2023-07-29 MED ORDER — SODIUM CHLORIDE 0.9% FLUSH
3.0000 mL | Freq: Two times a day (BID) | INTRAVENOUS | Status: DC
  Administered 2023-07-29 – 2023-08-01 (×7): 3 mL via INTRAVENOUS

## 2023-07-29 MED ORDER — PANTOPRAZOLE SODIUM 40 MG PO TBEC: 40.0000 mg | DELAYED_RELEASE_TABLET | Freq: Two times a day (BID) | ORAL | Status: DC

## 2023-07-29 MED ORDER — SODIUM ZIRCONIUM CYCLOSILICATE 5 G PO PACK
5.0000 g | PACK | Freq: Once | ORAL | Status: AC
  Administered 2023-07-29: 5 g via ORAL
  Filled 2023-07-29: qty 1

## 2023-07-29 MED ORDER — FUROSEMIDE 40 MG PO TABS
40.0000 mg | ORAL_TABLET | Freq: Every day | ORAL | Status: DC
  Administered 2023-07-30: 40 mg via ORAL
  Filled 2023-07-29: qty 1

## 2023-07-29 MED ORDER — ENOXAPARIN SODIUM 40 MG/0.4ML IJ SOSY
40.0000 mg | PREFILLED_SYRINGE | Freq: Every day | INTRAMUSCULAR | Status: DC
  Administered 2023-07-30: 40 mg via SUBCUTANEOUS
  Filled 2023-07-29: qty 0.4

## 2023-07-29 MED ORDER — TAFAMIDIS 61 MG PO CAPS: 1.0000 | ORAL_CAPSULE | Freq: Every day | ORAL | Status: DC

## 2023-07-29 MED ORDER — ALLOPURINOL 300 MG PO TABS
300.0000 mg | ORAL_TABLET | Freq: Every day | ORAL | Status: DC
  Administered 2023-07-30 – 2023-08-01 (×3): 300 mg via ORAL
  Filled 2023-07-29 (×3): qty 1

## 2023-07-29 MED ORDER — TAMSULOSIN HCL 0.4 MG PO CAPS
0.4000 mg | ORAL_CAPSULE | Freq: Every day | ORAL | Status: DC
  Administered 2023-07-29 – 2023-07-31 (×3): 0.4 mg via ORAL
  Filled 2023-07-29 (×3): qty 1

## 2023-07-29 MED ORDER — ACETAMINOPHEN 650 MG RE SUPP: 650.0000 mg | Freq: Four times a day (QID) | RECTAL | Status: DC | PRN

## 2023-07-29 MED ORDER — CHLORHEXIDINE GLUCONATE CLOTH 2 % EX PADS
6.0000 | MEDICATED_PAD | Freq: Every day | CUTANEOUS | Status: DC
  Administered 2023-07-30 – 2023-07-31 (×2): 6 via TOPICAL

## 2023-07-29 MED ORDER — PANTOPRAZOLE SODIUM 40 MG PO TBEC
40.0000 mg | DELAYED_RELEASE_TABLET | Freq: Two times a day (BID) | ORAL | Status: DC
  Administered 2023-07-29 – 2023-08-01 (×6): 40 mg via ORAL
  Filled 2023-07-29 (×6): qty 1

## 2023-07-29 MED ORDER — ENSURE ENLIVE PO LIQD
237.0000 mL | Freq: Two times a day (BID) | ORAL | Status: DC
  Administered 2023-07-29 – 2023-07-31 (×4): 237 mL via ORAL

## 2023-07-29 MED ORDER — ACETAMINOPHEN 325 MG PO TABS: 650.0000 mg | ORAL_TABLET | Freq: Four times a day (QID) | ORAL | Status: DC | PRN

## 2023-07-29 MED ORDER — ALLOPURINOL 100 MG PO TABS: 300.0000 mg | ORAL_TABLET | Freq: Every day | ORAL | Status: DC

## 2023-07-29 MED ORDER — LORAZEPAM 1 MG PO TABS
1.0000 mg | ORAL_TABLET | Freq: Four times a day (QID) | ORAL | Status: DC | PRN
  Administered 2023-07-30: 1 mg via ORAL
  Filled 2023-07-29: qty 1

## 2023-07-29 MED ORDER — MIDODRINE HCL 5 MG PO TABS
10.0000 mg | ORAL_TABLET | Freq: Three times a day (TID) | ORAL | Status: DC
  Administered 2023-07-29 – 2023-08-01 (×10): 10 mg via ORAL
  Filled 2023-07-29 (×10): qty 2

## 2023-07-29 MED ORDER — MIRTAZAPINE 15 MG PO TBDP
15.0000 mg | ORAL_TABLET | Freq: Every day | ORAL | Status: DC
  Administered 2023-07-29 – 2023-07-31 (×3): 15 mg via ORAL
  Filled 2023-07-29 (×5): qty 1

## 2023-07-29 MED ORDER — POLYETHYLENE GLYCOL 3350 17 G PO PACK: 17.0000 g | PACK | Freq: Every day | ORAL | Status: DC | PRN

## 2023-07-29 NOTE — Assessment & Plan Note (Addendum)
As well as amylodoidsis. C.w. amvuttra after DC. Resume Vyndamax if patient bring it from home.

## 2023-07-29 NOTE — ED Triage Notes (Signed)
The pt has had shortness  of breath  for 2 days  he has  extra fluid over his body for a few days  he had a paracentesis last Monday.  He is with hospice   he has chronic problems

## 2023-07-29 NOTE — ED Notes (Signed)
ED TO INPATIENT HANDOFF REPORT  ED Nurse Name and Phone #: Einar Grad [phone]  S Name/Age/Gender Frank Berg 67 y.o. male Room/Bed: 001C/001C  Code Status   Code Status: Do not attempt resuscitation (DNR) - Comfort care  Home/SNF/Other Home Patient oriented to: self, place, time, and situation Is this baseline? Yes   Triage Complete: Triage complete  Chief Complaint Abdominal distention [R14.0] Ascites [R18.8]  Triage Note The pt has had shortness  of breath  for 2 days  he has  extra fluid over his body for a few days  he had a paracentesis last Monday.  He is with hospice   he has chronic problems   Allergies Allergies  Allergen Reactions   Lipitor [Atorvastatin] Other (See Comments)    Body aches   Penicillins Swelling    **Tolerates cephalosporins Swollen face   Penicillin G     Other reaction(s): Unknown    Level of Care/Admitting Diagnosis ED Disposition     ED Disposition  Admit   Condition  --   Comment  Hospital Area: [facility] [[id number]]  Level of Care: Med-Surg [16]  May place patient in observation at [facility] or [facility] if equivalent level of care is available:: No  Covid Evaluation: Asymptomatic - no recent exposure (last 10 days) testing not required  Diagnosis: Ascites [[id number]]  Admitting Physician: Nolberto Hanlon [[id number]]  Attending Physician: Nolberto Hanlon [[id number]]          B Medical/Surgery History 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   HLD (hyperlipidemia)    Hx of adenomatous polyp of colon 12/03/2020   Hypertension    Indwelling urinary catheter present 04/2023   OSA on CPAP    Sleep apnea    Past Surgical History:  Procedure  Laterality Date   ABLATION     AORTIC VALVE REPLACEMENT  2011   CERVICAL DISC SURGERY     spinal stenosis   COLONOSCOPY     ESOPHAGOGASTRODUODENOSCOPY     IR PARACENTESIS  05/12/2023   IR PARACENTESIS  06/27/2023   IR PARACENTESIS  07/14/2023   IR PARACENTESIS  07/17/2023   IR PARACENTESIS  07/19/2023   RIGHT/LEFT HEART CATH AND CORONARY/GRAFT ANGIOGRAPHY N/A 03/18/2021   Procedure: RIGHT/LEFT HEART CATH AND CORONARY/GRAFT ANGIOGRAPHY;  Surgeon: Corky Crafts, MD;  Location: [facility] INVASIVE CV LAB;  Service: Cardiovascular;  Laterality: N/A;   TONSILLECTOMY       A IV Location/Drains/Wounds Patient Lines/Drains/Airways Status     Active Line/Drains/Airways     Name Placement date Placement time Site Days   Peripheral IV 07/29/23 20 G Posterior;Right Forearm 07/29/23  0246  Forearm  less than 1   Urethral Catheter Anne Shutter, RN Latex 18 Fr. 07/16/23  1450  Latex  13            Intake/Output Last 24 hours No intake or output data in the 24 hours ending 07/29/23 1231  Labs/Imaging Results for orders placed or performed during the hospital encounter of 07/29/23 (from the past 48 hour(s))  Basic metabolic panel     Status: Abnormal   Collection Time: 07/29/23  1:04 AM  Result Value Ref Range   Sodium 127 (L) 135 - 145  mmol/L   Potassium 5.4 (H) 3.5 - 5.1 mmol/L   Chloride 100 98 - 111 mmol/L   CO2 18 (L) 22 - 32 mmol/L   Glucose, Bld 135 (H) 70 - 99 mg/dL    Comment: Glucose reference range applies only to samples taken after fasting for at least 8 hours.   BUN 41 (H) 8 - 23 mg/dL   Creatinine, Ser 1.61 (H) 0.61 - 1.24 mg/dL   Calcium 8.2 (L) 8.9 - 10.3 mg/dL   GFR, Estimated 44 (L) >60 mL/min    Comment: (NOTE) Calculated using the CKD-EPI Creatinine Equation (2021)    Anion gap 9 5 - 15    Comment: Performed at [facility], [address], [place], [place] [address]  CBC     Status: Abnormal   Collection Time: 07/29/23  1:04 AM  Result Value Ref Range    WBC 6.8 4.0 - 10.5 K/uL   RBC 3.49 (L) 4.22 - 5.81 MIL/uL   Hemoglobin 9.6 (L) 13.0 - 17.0 g/dL   HCT 54.0 (L) 98.1 - 19.1 %   MCV 85.7 80.0 - 100.0 fL   MCH 27.5 26.0 - 34.0 pg   MCHC 32.1 30.0 - 36.0 g/dL   RDW 47.8 (H) 29.5 - 62.1 %   Platelets 336 150 - 400 K/uL   nRBC 0.0 0.0 - 0.2 %    Comment: Performed at [facility], [address] [place], [place] [address]  Troponin I (High Sensitivity)     Status: Abnormal   Collection Time: 07/29/23  1:04 AM  Result Value Ref Range   Troponin I (High Sensitivity) 63 (H) <18 ng/L    Comment: (NOTE) Elevated high sensitivity troponin I (hsTnI) values and significant  changes across serial measurements may suggest ACS but many other  chronic and acute conditions are known to elevate hsTnI results.  Refer to the "Links" section for chest pain algorithms and additional  guidance. Performed at [facility], [address], [place], [place] [address]   Brain natriuretic peptide     Status: Abnormal   Collection Time: 07/29/23  1:04 AM  Result Value Ref Range   B Natriuretic Peptide 1,029.0 (H) 0.0 - 100.0 pg/mL    Comment: Performed at [facility], [address] [place], [place] [address]  Hepatic function panel     Status: Abnormal   Collection Time: 07/29/23  1:04 AM  Result Value Ref Range   Total Protein 7.1 6.5 - 8.1 g/dL   Albumin 2.6 (L) 3.5 - 5.0 g/dL   AST 43 (H) 15 - 41 U/L   ALT 10 0 - 44 U/L   Alkaline Phosphatase 115 38 - 126 U/L   Total Bilirubin 1.1 0.3 - 1.2 mg/dL   Bilirubin, Direct 0.6 (H) 0.0 - 0.2 mg/dL   Indirect Bilirubin 0.5 0.3 - 0.9 mg/dL    Comment: Performed at [facility], [address] [place], [place] [address]  Protime-INR     Status: Abnormal   Collection Time: 07/29/23  1:04 AM  Result Value Ref Range   Prothrombin Time 17.2 (H) 11.4 - 15.2 seconds   INR 1.4 (H) 0.8 - 1.2    Comment: (NOTE) INR goal varies based on device and disease states. Performed at [facility], [address], [place], [place] [address]   Magnesium     Status: None   Collection Time: 07/29/23  1:04 AM  Result  Value Ref Range   Magnesium 2.1 1.7 - 2.4 mg/dL    Comment: Performed at [facility], [address] [place], [place] [address]  Ammonia     Status: Abnormal   Collection Time: 07/29/23  2:47 AM  Result Value Ref Range   Ammonia 45 (H) 9 - 35 umol/L    Comment: Performed at [facility], [address], [place], [place] [address]  Troponin I (High Sensitivity)     Status: Abnormal   Collection Time: 07/29/23  2:47 AM  Result Value Ref Range   Troponin I (High Sensitivity) 57 (H) <18 ng/L    Comment: (NOTE) Elevated high sensitivity troponin I (hsTnI) values and significant  changes across serial measurements may suggest ACS but many other  chronic and acute conditions are known to elevate hsTnI results.  Refer to the "Links" section for chest pain algorithms and additional  guidance. Performed at [facility], [address], [place], [place] [address]   CBC     Status: Abnormal   Collection Time: 07/29/23  8:15 AM  Result Value Ref Range   WBC 6.3 4.0 - 10.5 K/uL   RBC 3.38 (L) 4.22 - 5.81 MIL/uL   Hemoglobin 9.8 (L) 13.0 - 17.0 g/dL   HCT 25.3 (L) 66.4 - 40.3 %   MCV 86.7 80.0 - 100.0 fL   MCH 29.0 26.0 - 34.0 pg   MCHC 33.4 30.0 - 36.0 g/dL   RDW 47.4 (H) 25.9 - 56.3 %   Platelets 339 150 - 400 K/uL   nRBC 0.0 0.0 - 0.2 %    Comment: Performed at [facility], [address], [place], [place] [address]  Creatinine, serum     Status: Abnormal   Collection Time: 07/29/23  8:15 AM  Result Value Ref Range   Creatinine, Ser 1.59 (H) 0.61 - 1.24 mg/dL   GFR, Estimated 47 (L) >60 mL/min    Comment: (NOTE) Calculated using the CKD-EPI Creatinine Equation (2021) Performed at [facility], [address] [place], [place] [address]    DG Chest 2 View  Result Date: 07/29/2023 CLINICAL DATA:  Shortness of breath EXAM:  CHEST - 2 VIEW COMPARISON:  Chest radiograph 12/20/2021 FINDINGS: Stable cardiomediastinal silhouette. Aortic atherosclerotic calcification. Sternotomy and AVR. No focal consolidation, pleural effusion, or pneumothorax. Chronic scarring of the right mid and lower lung with blunting of the costophrenic angle. No displaced rib fractures. IMPRESSION: No active cardiopulmonary disease. Electronically Signed   By: Minerva Fester M.D.   On: 07/29/2023 02:22    Pending Labs Unresulted Labs (From admission, onward)     Start     Ordered   08/05/23 0500  Creatinine, serum  (enoxaparin (LOVENOX)    CrCl >/= 30 ml/min)  Weekly,   R     Comments: while on enoxaparin therapy    07/29/23 0758   07/30/23 0500  APTT  Tomorrow morning,   R        07/29/23 0758   07/30/23 0500  Protime-INR  Tomorrow morning,   R        07/29/23 0758   07/30/23 0500  Basic metabolic panel  Tomorrow morning,   R        07/29/23 0758   07/30/23 0500  CBC  Tomorrow morning,   R        07/29/23 0758            Vitals/Pain Today's Vitals  07/29/23 0634 07/29/23 0815 07/29/23 1014 07/29/23 1023  BP:  105/75  105/83  Pulse:  80  80  Resp:  16  16  Temp: 98.8 F (37.1 C)  97.8 F (36.6 C)   TempSrc: Oral  Oral   SpO2:  99%  100%  Weight:      Height:      PainSc:        Isolation Precautions No active isolations  Medications Medications  enoxaparin (LOVENOX) injection 40 mg (has no administration in time range)  acetaminophen (TYLENOL) tablet 650 mg (has no administration in time range)    Or  acetaminophen (TYLENOL) suppository 650 mg (has no administration in time range)  polyethylene glycol (MIRALAX / GLYCOLAX) packet 17 g (has no administration in time range)  sodium chloride flush (NS) 0.9 % injection 3 mL (3 mLs Intravenous Given 07/29/23 0902)  furosemide (LASIX) tablet 40 mg (has no administration in time range)  HYDROmorphone HCl (DILAUDID) liquid 1 mg (has no administration in time range)   LORazepam (ATIVAN) tablet 1 mg (has no administration in time range)  midodrine (PROAMATINE) tablet 10 mg (has no administration in time range)  mirtazapine (REMERON SOL-TAB) disintegrating tablet 15 mg (has no administration in time range)  tamsulosin (FLOMAX) capsule 0.4 mg (has no administration in time range)  pantoprazole (PROTONIX) EC tablet 40 mg (has no administration in time range)  allopurinol (ZYLOPRIM) tablet 300 mg (has no administration in time range)  sodium zirconium cyclosilicate (LOKELMA) packet 5 g (5 g Oral Given 07/29/23 1027)  furosemide (LASIX) tablet 80 mg (80 mg Oral Given 07/29/23 1024)    Mobility walks with device     Focused Assessments Pulmonary Assessment Handoff:  Lung sounds: Bilateral Breath Sounds: Diminished O2 Device: Room Air      R Recommendations: See Admitting Provider Note  Report given to:   Additional Notes:

## 2023-07-29 NOTE — Progress Notes (Signed)
Received patient from ED to room [phone]. Patient is alert and oriented x4, vitals measured, room air. Telemetry monitoring. Suction set up. Skin assessed with 2 nurses. Orders reviewed, plan of care discussed. Safety measures implemented.

## 2023-07-29 NOTE — ED Provider Notes (Signed)
[place] EMERGENCY DEPARTMENT AT [facility] Provider Note   CSN: [id number] Arrival date & time: 07/29/23  0004     History  Chief Complaint  Patient presents with   Shortness of Breath    Frank Berg is a 67 y.o. male.  The history is provided by the patient and medical records.  Shortness of Breath  67 year old male with history of A-fib (xarelto being held due to recent GI bleeding), cirrhosis with recurrent ascites and hepatorenal syndrome, chronic CHF, urinary retention with indwelling Foley, sleep apnea, presenting to the ED with shortness of breath.  Since hospital discharge patient has been doing fairly well.  Frank Berg actually went out to lunch with wife today, had soup and salad at [employer].  This afternoon seemed to get a little more SOB, more fatigued.  States Frank Berg cannot lay flat due to increased SOB.  Frank Berg is not currently requiring oxygen.  Wife states today Foley catheter has had less output in his abdomen and legs are increasingly more swollen as well.  Frank Berg denies any fever or chills.  Frank Berg was supposed to have outpatient paracentesis set up, however this has not yet been done.  Home hospice has come in and brought bed and other supplies for comfort at home.  States she called hospice center today and they told her that someone may be able to follow-up on Monday but probably needed to come to the ER for possible evaluation.  Frank Berg denies any significant pain at this time.  Maintains DNR/DNI status.  Home Medications Prior to Admission medications   Medication Sig Start Date End Date Taking? Authorizing Provider  allopurinol (ZYLOPRIM) 300 MG tablet Take 300 mg by mouth daily. 11/23/19   [provider]  ciprofloxacin (CIPRO) 500 MG tablet Take 1 tablet (500 mg total) by mouth 2 (two) times daily for 7 days. 07/24/23 07/31/23  Leroy Sea, MD  colchicine 0.6 MG tablet Take 0.6 mg by mouth as needed. 02/14/23   [provider]  Ensure Max  Protein (ENSURE MAX PROTEIN) LIQD Take 330 mLs (11 oz total) by mouth at bedtime. 12/20/21   Marguerita Merles Latif, DO  ferrous sulfate 325 (65 FE) MG tablet Take 1 tablet (325 mg total) by mouth daily with breakfast. 03/09/23   [name], MD  glucose blood (ONETOUCH VERIO) test strip TEST ONCE A DAY DX E11.29    [provider]  HYDROmorphone (DILAUDID) 2 MG tablet Take 0.5 tablets (1 mg total) by mouth every 6 (six) hours as needed for up to 5 days for severe pain. 07/24/23 07/29/23  Leroy Sea, MD  loratadine (CLARITIN) 10 MG tablet Take 10 mg by mouth daily as needed for allergies.    [provider]  LORazepam (ATIVAN) 1 MG tablet Take 1 tablet (1 mg total) by mouth every 8 (eight) hours as needed for anxiety. 07/24/23 07/23/24  Leroy Sea, MD  midodrine (PROAMATINE) 10 MG tablet Take 1 tablet (10 mg total) by mouth 3 (three) times daily. 07/24/23   Leroy Sea, MD  mirtazapine (REMERON) 15 MG tablet Take 15 mg by mouth at bedtime.    [provider]  Multiple Vitamins-Minerals (CERTAVITE/ANTIOXIDANTS) TABS Take 1 tablet by mouth daily. 12/21/21   Marguerita Merles Latif, DO  pantoprazole (PROTONIX) 40 MG tablet Take 1 tablet (40 mg total) by mouth 2 (two) times daily. 07/17/23 08/16/23  Dorcas Carrow, MD  Tafamidis (VYNDAMAX) 61 MG CAPS Take 1 capsule by mouth  daily. 09/08/22   Bensimhon, Bevelyn Buckles, MD  tamsulosin (FLOMAX) 0.4 MG CAPS capsule Take 0.4 mg by mouth at bedtime. 06/09/23   [provider]  Vitamin A 2400 MCG (8000 UT) CAPS Take 1 capsule by mouth daily. 06/29/23   Bensimhon, Bevelyn Buckles, MD  vutrisiran sodium (AMVUTTRA) 25 MG/0.5ML syringe Inject 0.5 mLs (25 mg total) into the skin every 3 (three) months. 06/23/23   Bensimhon, Bevelyn Buckles, MD      Allergies    Lipitor [atorvastatin], Penicillins, and Penicillin g    Review of Systems   Review of Systems  Respiratory:  Positive for shortness of breath.   All other systems reviewed and are  negative.   Physical Exam Updated Vital Signs BP 94/74 (BP Location: Right Arm)   Pulse 84   Temp 98.3 F (36.8 C) (Oral)   Resp 17   Ht 6\' 2"  (1.88 m)   Wt 77.1 kg   SpO2 100%   BMI 21.82 kg/m   Physical Exam Vitals and nursing note reviewed.  Constitutional:      Appearance: Frank Berg is well-developed.     Comments: Thin, chronically ill appearing  HENT:     Head: Normocephalic and atraumatic.  Eyes:     Conjunctiva/sclera: Conjunctivae normal.     Pupils: Pupils are equal, round, and reactive to light.  Cardiovascular:     Rate and Rhythm: Normal rate and regular rhythm.     Heart sounds: Normal heart sounds.  Pulmonary:     Effort: Pulmonary effort is normal.     Breath sounds: Normal breath sounds. No wheezing or rhonchi.  Abdominal:     General: Bowel sounds are normal. There is distension.     Comments: Distended with ascites present  Genitourinary:    Comments: Foley in place, approx 200cc present in collection bag Musculoskeletal:        General: Normal range of motion.     Cervical back: Normal range of motion.     Comments: 2+ pitting edema of BLE, grossly symmetric  Skin:    General: Skin is warm and dry.  Neurological:     Mental Status: Frank Berg is alert and oriented to person, place, and time.     ED Results / Procedures / Treatments   Labs (all labs ordered are listed, but only abnormal results are displayed) Labs Reviewed  BASIC METABOLIC PANEL - Abnormal; Notable for the following components:      Result Value   Sodium 127 (*)    Potassium 5.4 (*)    CO2 18 (*)    Glucose, Bld 135 (*)    BUN 41 (*)    Creatinine, Ser 1.66 (*)    Calcium 8.2 (*)    GFR, Estimated 44 (*)    All other components within normal limits  CBC - Abnormal; Notable for the following components:   RBC 3.49 (*)    Hemoglobin 9.6 (*)    HCT 29.9 (*)    RDW 17.3 (*)    All other components within normal limits  BRAIN NATRIURETIC PEPTIDE - Abnormal; Notable for the following  components:   B Natriuretic Peptide 1,029.0 (*)    All other components within normal limits  AMMONIA - Abnormal; Notable for the following components:   Ammonia 45 (*)    All other components within normal limits  HEPATIC FUNCTION PANEL - Abnormal; Notable for the following components:   Albumin 2.6 (*)    AST 43 (*)  Bilirubin, Direct 0.6 (*)    All other components within normal limits  PROTIME-INR - Abnormal; Notable for the following components:   Prothrombin Time 17.2 (*)    INR 1.4 (*)    All other components within normal limits  TROPONIN I (HIGH SENSITIVITY) - Abnormal; Notable for the following components:   Troponin I (High Sensitivity) 63 (*)    All other components within normal limits  MAGNESIUM  TROPONIN I (HIGH SENSITIVITY)    EKG None  Radiology DG Chest 2 View  Result Date: 07/29/2023 CLINICAL DATA:  Shortness of breath EXAM: CHEST - 2 VIEW COMPARISON:  Chest radiograph 12/20/2021 FINDINGS: Stable cardiomediastinal silhouette. Aortic atherosclerotic calcification. Sternotomy and AVR. No focal consolidation, pleural effusion, or pneumothorax. Chronic scarring of the right mid and lower lung with blunting of the costophrenic angle. No displaced rib fractures. IMPRESSION: No active cardiopulmonary disease. Electronically Signed   By: Minerva Fester M.D.   On: 07/29/2023 02:22    Procedures Procedures    Medications Ordered in ED Medications - No data to display  ED Course/ Medical Decision Making/ A&P                                 Medical Decision Making Amount and/or Complexity of Data Reviewed Labs: ordered. Radiology: ordered and independent interpretation performed. ECG/medicine tests: ordered and independent interpretation performed.  Risk Decision regarding hospitalization.   67 year old male presenting to the ED with shortness of breath.  Just released from the hospital on Monday after admission for GI bleed.  Found to have end-stage liver  disease causing hepatorenal failure, recent paracentesis, etc.  Frank Berg was ultimately discharged home on hospice.  Wife reports they have been to the home and set up supplies/equipment, however no one has arranged his outpatient paracentesis.  Has had worsening swelling of the abdomen and legs today, decreased urine output in Foley catheter.  Frank Berg has not had any fevers.  Patient is chronically ill-appearing but nontoxic.  Frank Berg is hemodynamically stable.  Frank Berg is not acutely distressed.  Frank Berg does have abdominal distention with ascites present.  2+ pitting edema of bilateral lower extremities which is grossly symmetric.  Has Foley in place, only has about 200 cc in urine collection bag at this time.  Labs obtained--no leukocytosis.  Does have some hyponatremia, appears baseline compared with prior.  Renal function is actually improved from prior, 1.66 today (previously around 2- 2.4).  BNP 1000+.  Troponin 63, similar to prior.  CXR without pulmonary edema.  Patient appears to need paracentesis.  As it is the weekend, this is not going to get set up anytime soon in outpatient setting.  We will admit for observation, hopefully paracentesis in the morning.  Discussed with Dr. Margo Aye-- will admit for ongoing care.  Final Clinical Impression(s) / ED Diagnoses Final diagnoses:  Shortness of breath  Abdominal distention    Rx / DC Orders ED Discharge Orders     None         Garlon Hatchet, PA-C 07/29/23 0341    Palumbo, April, MD 07/29/23 0160

## 2023-07-29 NOTE — Assessment & Plan Note (Signed)
Patient was diagnosed with cirrhosis, possible hepatorenal syndrome, possible SBP in last hospitalizations.  See GI note from July 22, 2023.  It does not look like the etiology of cirrhosis was further pursued as patient opted for home hospice.  Patient reiterated his goals of care to me at this time.

## 2023-07-29 NOTE — H&P (Addendum)
History and Physical    Patient: Frank Berg ZOX:[id number] DOB: 1956/08/05 DOA: 07/29/2023 DOS: the patient was seen and examined on 07/29/2023 PCP: Adrian Prince, MD  Patient coming from: Home  Chief Complaint:  Chief Complaint  Patient presents with   Shortness of Breath   HPI: HAMZEH POPWELL is a 67 y.o. male with medical history significant of end-stage liver cirrhosis , ischemic cardiomyopathy, cardiac amyloidosis, A-fib on chronic anticoagulation, type 2 diabetes, sleep apnea on CPAP, CKD stage IIIb, recurrent ascites needing paracentesis who was recently hospitalized for GI bleed workup and was discharged on 07/17/2023 . Furhter admited on 07/18/2023 with lightheadedness, weakness, syncopal episode. He was hypotensive on presentation on 07/18/2023 .     Per last DC sumarry on 07/24/2023 : "Found to have acute blood loss anemia with hemoglobin in the range of 7.  Transferred with 2 units of PRBC.  This morning hemoglobin stable in the range of 9.  Xarelto on hold.  Patient mentioned seeing darker stool a day before admission.was seen by GI team this admission again, Xarelto was held, he received vitamin K and Kcentra for Xarelto reversal, on PPI.  Currently CBC has stabilized, continue to monitor on PPI, continue to hold Xarelto which I agree with, acetic fluid still bloody on 07/23/2023.  Now being transitioned to comfort care.  Will be discharged home with home hospice. " Furhter he was diagnosed with qustionable SBP at the time.  Further it seems that his diuretics were held due to requiring high doses of midodrine to maintain his blood pressure.  Patient currently returns on July 29, 2023 with about a 4-day syndrome of worsening leg swelling, abdominal distention, as well as sensation of shortness of breath when patient lays down at night to sleep.  Patient is further having sensation of shortness of breath when he exerts himself at home such as to go up stairs to the bedroom, which  she has to do very slowly.  Patient does not have any chest pain, palpitation, loss of consciousness, fever or coughing.  Patient states that he tried to get in touch with his "medical team ", which I understand to me and the hospital service, however his symptoms could not be controlled adequately for him and therefore patient returns to the ER.  Patient has remained vitally stable in the ER.  Patient reports that abdominal discomfort is mild, and as such he does not have any marked abdominal pain, nausea or vomiting. Medical eval is sought.   Review of Systems: As mentioned in the history of present illness. All other systems reviewed and are negative. 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   HLD (hyperlipidemia)    Hx of adenomatous polyp of colon 12/03/2020   Hypertension    Indwelling urinary catheter present 04/2023   OSA on CPAP    Sleep apnea    Past Surgical History:  Procedure Laterality Date   ABLATION     AORTIC VALVE REPLACEMENT  2011   CERVICAL DISC SURGERY     spinal stenosis   COLONOSCOPY     ESOPHAGOGASTRODUODENOSCOPY     IR PARACENTESIS  05/12/2023   IR PARACENTESIS  06/27/2023   IR PARACENTESIS  07/14/2023   IR  PARACENTESIS  07/17/2023   IR PARACENTESIS  07/19/2023   RIGHT/LEFT HEART CATH AND CORONARY/GRAFT ANGIOGRAPHY N/A 03/18/2021   Procedure: RIGHT/LEFT HEART CATH AND CORONARY/GRAFT ANGIOGRAPHY;  Surgeon: Corky Crafts, MD;  Location: [facility] INVASIVE CV LAB;  Service: Cardiovascular;  Laterality: N/A;   TONSILLECTOMY     Social History:  reports that he quit smoking about 41 years ago. His smoking use included cigarettes. He started smoking about 51 years ago. He has a 2.5 pack-year smoking  history. He has never used smokeless tobacco. He reports current alcohol use. He reports that he does not use drugs.  Allergies  Allergen Reactions   Lipitor [Atorvastatin] Other (See Comments)    Body aches   Penicillins Swelling    **Tolerates cephalosporins Swollen face   Penicillin G     Other reaction(s): Unknown    Family History  Problem Relation Age of Onset   Congenital heart disease Mother    Diabetes Mother    Hypertension Mother    Heart disease Mother    Heart attack Father    Cervical cancer Sister    Diabetes Sister    Hypertension Brother    Diabetes Brother    Pancreatic cancer Brother    Diabetes Brother    Breast cancer Sister    Diabetes Sister    Diabetes Sister    Diabetes Sister    Diabetes Sister    Diabetes Son    Colon cancer Neg Hx    Colon polyps Neg Hx    Esophageal cancer Neg Hx    Stomach cancer Neg Hx     Prior to Admission medications   Medication Sig Start Date End Date Taking? Authorizing Provider  allopurinol (ZYLOPRIM) 300 MG tablet Take 300 mg by mouth daily. 11/23/19   [provider]  ciprofloxacin (CIPRO) 500 MG tablet Take 1 tablet (500 mg total) by mouth 2 (two) times daily for 7 days. 07/24/23 07/31/23  Leroy Sea, MD  colchicine 0.6 MG tablet Take 0.6 mg by mouth as needed. 02/14/23   [provider]  Ensure Max Protein (ENSURE MAX PROTEIN) LIQD Take 330 mLs (11 oz total) by mouth at bedtime. 12/20/21   Marguerita Merles Latif, DO  ferrous sulfate 325 (65 FE) MG tablet Take 1 tablet (325 mg total) by mouth daily with breakfast. 03/09/23   [name], MD  glucose blood (ONETOUCH VERIO) test strip TEST ONCE A DAY DX E11.29    [provider]  HYDROmorphone (DILAUDID) 2 MG tablet Take 0.5 tablets (1 mg total) by mouth every 6 (six) hours as needed for up to 5 days for severe pain. 07/24/23 07/29/23  Leroy Sea, MD  loratadine (CLARITIN) 10 MG tablet Take 10 mg by mouth daily as needed for  allergies.    [provider]  LORazepam (ATIVAN) 1 MG tablet Take 1 tablet (1 mg total) by mouth every 8 (eight) hours as needed for anxiety. 07/24/23 07/23/24  Leroy Sea, MD  midodrine (PROAMATINE) 10 MG tablet Take 1 tablet (10 mg total) by mouth 3 (three) times daily. 07/24/23   Leroy Sea, MD  mirtazapine (REMERON) 15 MG tablet Take 15 mg by mouth at bedtime.    [provider]  Multiple Vitamins-Minerals (CERTAVITE/ANTIOXIDANTS) TABS Take 1 tablet by mouth daily. 12/21/21   Marguerita Merles Latif, DO  pantoprazole (PROTONIX) 40 MG tablet Take 1 tablet (40 mg total) by mouth 2 (two) times daily. 07/17/23 08/16/23  Dorcas Carrow, MD  Tafamidis (VYNDAMAX) 61 MG CAPS Take 1 capsule by mouth daily. 09/08/22   Bensimhon, Bevelyn Buckles, MD  tamsulosin (FLOMAX) 0.4 MG CAPS capsule Take 0.4 mg by mouth at bedtime. 06/09/23   [provider]  Vitamin A 2400 MCG (8000 UT) CAPS Take 1 capsule by mouth daily. 06/29/23   Bensimhon, Bevelyn Buckles, MD  vutrisiran sodium (AMVUTTRA) 25 MG/0.5ML syringe Inject 0.5 mLs (25 mg total) into the skin every 3 (three) months. 06/23/23   Bensimhon, Bevelyn Buckles, MD    Physical Exam: Vitals:   07/29/23 0545 07/29/23 0600 07/29/23 0634 07/29/23 0815  BP: 96/72 99/74  105/75  Pulse: 81   80  Resp: 12 18  16   Temp:   98.8 F (37.1 C)   TempSrc:   Oral   SpO2: 99%   99%  Weight:      Height:       General: Patient appears fatigued, however does not appear to have any distress.  Patient is being maintained on room air. Respiratory exam: Bilateral air entry is actually vesicular Cardiovascular exam there is distention of external jugular veins, S1-S2 normal Abdomen: Distended however no tense ascites, no focal tenderness GU: Foley in situ with dark urine Extremities: 2+ edema up to knees. No focal deficit is noted Data Reviewed:  Labs on Admission:  Results for orders placed or performed during the hospital encounter of 07/29/23 (from the  past 24 hour(s))  Basic metabolic panel     Status: Abnormal   Collection Time: 07/29/23  1:04 AM  Result Value Ref Range   Sodium 127 (L) 135 - 145 mmol/L   Potassium 5.4 (H) 3.5 - 5.1 mmol/L   Chloride 100 98 - 111 mmol/L   CO2 18 (L) 22 - 32 mmol/L   Glucose, Bld 135 (H) 70 - 99 mg/dL   BUN 41 (H) 8 - 23 mg/dL   Creatinine, Ser 3.01 (H) 0.61 - 1.24 mg/dL   Calcium 8.2 (L) 8.9 - 10.3 mg/dL   GFR, Estimated 44 (L) >60 mL/min   Anion gap 9 5 - 15  CBC     Status: Abnormal   Collection Time: 07/29/23  1:04 AM  Result Value Ref Range   WBC 6.8 4.0 - 10.5 K/uL   RBC 3.49 (L) 4.22 - 5.81 MIL/uL   Hemoglobin 9.6 (L) 13.0 - 17.0 g/dL   HCT 60.1 (L) 09.3 - 23.5 %   MCV 85.7 80.0 - 100.0 fL   MCH 27.5 26.0 - 34.0 pg   MCHC 32.1 30.0 - 36.0 g/dL   RDW 57.3 (H) 22.0 - 25.4 %   Platelets 336 150 - 400 K/uL   nRBC 0.0 0.0 - 0.2 %  Troponin I (High Sensitivity)     Status: Abnormal   Collection Time: 07/29/23  1:04 AM  Result Value Ref Range   Troponin I (High Sensitivity) 63 (H) <18 ng/L  Brain natriuretic peptide     Status: Abnormal   Collection Time: 07/29/23  1:04 AM  Result Value Ref Range   B Natriuretic Peptide 1,029.0 (H) 0.0 - 100.0 pg/mL  Hepatic function panel     Status: Abnormal   Collection Time: 07/29/23  1:04 AM  Result Value Ref Range   Total Protein 7.1 6.5 - 8.1 g/dL   Albumin 2.6 (L) 3.5 - 5.0 g/dL   AST 43 (H) 15 - 41 U/L   ALT 10 0 - 44 U/L   Alkaline Phosphatase 115 38 - 126 U/L  Total Bilirubin 1.1 0.3 - 1.2 mg/dL   Bilirubin, Direct 0.6 (H) 0.0 - 0.2 mg/dL   Indirect Bilirubin 0.5 0.3 - 0.9 mg/dL  Protime-INR     Status: Abnormal   Collection Time: 07/29/23  1:04 AM  Result Value Ref Range   Prothrombin Time 17.2 (H) 11.4 - 15.2 seconds   INR 1.4 (H) 0.8 - 1.2  Magnesium     Status: None   Collection Time: 07/29/23  1:04 AM  Result Value Ref Range   Magnesium 2.1 1.7 - 2.4 mg/dL  Ammonia     Status: Abnormal   Collection Time: 07/29/23  2:47 AM   Result Value Ref Range   Ammonia 45 (H) 9 - 35 umol/L  Troponin I (High Sensitivity)     Status: Abnormal   Collection Time: 07/29/23  2:47 AM  Result Value Ref Range   Troponin I (High Sensitivity) 57 (H) <18 ng/L  CBC     Status: Abnormal   Collection Time: 07/29/23  8:15 AM  Result Value Ref Range   WBC 6.3 4.0 - 10.5 K/uL   RBC 3.38 (L) 4.22 - 5.81 MIL/uL   Hemoglobin 9.8 (L) 13.0 - 17.0 g/dL   HCT 40.9 (L) 81.1 - 91.4 %   MCV 86.7 80.0 - 100.0 fL   MCH 29.0 26.0 - 34.0 pg   MCHC 33.4 30.0 - 36.0 g/dL   RDW 78.2 (H) 95.6 - 21.3 %   Platelets 339 150 - 400 K/uL   nRBC 0.0 0.0 - 0.2 %  Creatinine, serum     Status: Abnormal   Collection Time: 07/29/23  8:15 AM  Result Value Ref Range   Creatinine, Ser 1.59 (H) 0.61 - 1.24 mg/dL   GFR, Estimated 47 (L) >60 mL/min   Basic Metabolic Panel: Recent Labs  Lab 07/23/23 1257 07/24/23 0454 07/29/23 0104 07/29/23 0815  NA 125* 126* 127*  --   K 3.9 4.6 5.4*  --   CL 96* 97* 100  --   CO2 18* 18* 18*  --   GLUCOSE 88 156* 135*  --   BUN 44* 44* 41*  --   CREATININE 2.03* 2.02* 1.66* 1.59*  CALCIUM 8.3* 8.5* 8.2*  --   MG 2.2 2.2 2.1  --    Liver Function Tests: Recent Labs  Lab 07/23/23 1257 07/29/23 0104  AST 22 43*  ALT 9 10  ALKPHOS 89 115  BILITOT 2.3* 1.1  PROT 6.0* 7.1  ALBUMIN 2.9* 2.6*   No results for input(s): "LIPASE", "AMYLASE" in the last 168 hours. Recent Labs  Lab 07/29/23 0247  AMMONIA 45*   CBC: Recent Labs  Lab 07/23/23 1257 07/24/23 0454 07/29/23 0104 07/29/23 0815  WBC 5.5 5.5 6.8 6.3  NEUTROABS 4.6 4.3  --   --   HGB 8.3* 9.1* 9.6* 9.8*  HCT 25.0* 26.9* 29.9* 29.3*  MCV 86.2 86.5 85.7 86.7  PLT 189 203 336 339   Cardiac Enzymes: Recent Labs  Lab 07/29/23 0104 07/29/23 0247  TROPONINIHS 63* 57*    BNP (last 3 results) No results for input(s): "PROBNP" in the last 8760 hours. CBG: No results for input(s): "GLUCAP" in the last 168 hours.  Radiological Exams on  Admission:  DG Chest 2 View  Result Date: 07/29/2023 CLINICAL DATA:  Shortness of breath EXAM: CHEST - 2 VIEW COMPARISON:  Chest radiograph 12/20/2021 FINDINGS: Stable cardiomediastinal silhouette. Aortic atherosclerotic calcification. Sternotomy and AVR. No focal consolidation, pleural effusion, or pneumothorax. Chronic scarring of  the right mid and lower lung with blunting of the costophrenic angle. No displaced rib fractures. IMPRESSION: No active cardiopulmonary disease. Electronically Signed   By: Minerva Fester M.D.   On: 07/29/2023 02:22    EKG: Independently reviewed. Sinus tachy. No peaked T waves.   Assessment and Plan: * DOE (dyspnea on exertion) Is a chronic issue for the patient, however it is especially worse over the last 4 days likely due to worsening fluid status in the setting of patient's diuretics having being held in the last hospitalization due to his low blood pressure.  At this time patient's blood pressure still remains soft.  However when I saw the patient his blood pressure systolic was at least over 100.  I will trial low-dose of Lasix initiation and see if that helps patient's fluid status as well as dyspnea.  Patient is not requiring supplementary oxygen at home or at this time.  Another factor may be patient's mild metabolic acidosis from CKD.  However I will defer starting sodium bicarbonate just yet so as to not worsen patient's fluid status.  I will also put in some fluid restrictions for patient  Abdominal distention Consistent with ascites. I think this is part and parcel of patient's systemic fluid overload from his prior known liver disease as well as ischemic cardiomyopathy as well as cardiac amyloidosis.  We can consider paracentesis, however given that patient is not markedly symptomatic per se from the abdominal distention, I want to trial diuretics first.  And see if that helps the patient symptomatically.  Note that patient was discharged discharged with  antibiotic on last hospitalization because he was diagnosed with possible spontaneous bacterial peritonitis  Ischemic cardiomyopathy As well as amylodoidsis. C.w. amvuttra after DC. Resume Vyndamax if patient bring it from home.   Benign prostatic hyperplasia with lower urinary tract symptoms Not currently an acute issue.  From DC summary on 9/30: Foley and Flomax.  Will be discharged with Foley catheter.  Currently on hospice.  If desired outpatient urology follow-up for Foley removal in 1 to 2 weeks.      Abnormal findings on diagnostic imaging of liver and biliary tract Patient was diagnosed with cirrhosis, possible hepatorenal syndrome, possible SBP in last hospitalizations.  See GI note from July 22, 2023.  It does not look like the etiology of cirrhosis was further pursued as patient opted for home hospice.  Patient reiterated his goals of care to me at this time.  CKD (chronic kidney disease), stage II Creatinine seems to be at baseline which fluctuates around 2, patient has chronic hyponatremia, noted to have mild hyperkalemia this morning.  I will treat with some Lokelma.  Patient seems to have chronic metabolic acidosis with normal anion gap, likely due to CKD, chronic trop elevation likely again due to CKD  PAF (paroxysmal atrial fibrillation) (HCC) Patient's anticoagulation was held in last hospitalization because patient has previously had intramuscular hematoma formation and also his ascitic tap was markedly bloody.   Comfort meds ordered. Home med rec revewied.    Advance Care Planning:   Code Status: Do not attempt resuscitation (DNR) - Comfort care this was discussed with the patient, reiterated.  Patient's care plan was modified according to patient's goals of care wishes.  Consults: Consider IR evaluation if Patient's ascites becomes markedly symptomatic. Family Communication: Per patient  Severity of Illness: The appropriate patient status for this patient is  INPATIENT. Inpatient status is judged to be reasonable and necessary in order to provide  the required intensity of service to ensure the patient's safety. The patient's presenting symptoms, physical exam findings, and initial radiographic and laboratory data in the context of their chronic comorbidities is felt to place them at high risk for further clinical deterioration. Furthermore, it is not anticipated that the patient will be medically stable for discharge from the hospital within 2 midnights of admission.   * I certify that at the point of admission it is my clinical judgment that the patient will require inpatient hospital care spanning beyond 2 midnights from the point of admission due to high intensity of service, high risk for further deterioration and high frequency of surveillance required.*  Author: Nolberto Hanlon, MD 07/29/2023 9:37 AM  For on call review [email or web address].

## 2023-07-29 NOTE — Assessment & Plan Note (Addendum)
Not currently an acute issue.  From DC summary on 9/30: Foley and Flomax.  Will be discharged with Foley catheter.  Currently on hospice.  If desired outpatient urology follow-up for Foley removal in 1 to 2 weeks.

## 2023-07-29 NOTE — Assessment & Plan Note (Signed)
Is a chronic issue for the patient, however it is especially worse over the last 4 days likely due to worsening fluid status in the setting of patient's diuretics having being held in the last hospitalization due to his low blood pressure.  At this time patient's blood pressure still remains soft.  However when I saw the patient his blood pressure systolic was at least over 100.  I will trial low-dose of Lasix initiation and see if that helps patient's fluid status as well as dyspnea.  Patient is not requiring supplementary oxygen at home or at this time.  Another factor may be patient's mild metabolic acidosis from CKD.  However I will defer starting sodium bicarbonate just yet so as to not worsen patient's fluid status.  I will also put in some fluid restrictions for patient

## 2023-07-29 NOTE — Assessment & Plan Note (Addendum)
Consistent with ascites. I think this is part and parcel of patient's systemic fluid overload from his prior known liver disease as well as ischemic cardiomyopathy as well as cardiac amyloidosis.  We can consider paracentesis, however given that patient is not markedly symptomatic per se from the abdominal distention, I want to trial diuretics first.  And see if that helps the patient symptomatically.  Note that patient was discharged discharged with antibiotic on last hospitalization because he was diagnosed with possible spontaneous bacterial peritonitis

## 2023-07-29 NOTE — Progress Notes (Signed)
[name] [id number]- [facility] hospitalized hospice patient visit  Mr. Saber Dickerman is a current [facility] patient with a terminal diagnosis of cirrhosis of the liver. He was presented to the ED after 2 days of worsening shortness of breath. He was admitted 0n 10.5 due to Dyspnea and abdominal distention. [facility] was notified after patient went to the ED. Per Dr. Kirt Boys with [facility] this is a related admission.   Visited with patient in the ED. Assisted him to sit up and helped get his tray in order so he could eat. Patient reports to feeling some better since arriving to the emergency room but reports that the swelling is significant. Discussed possibility of Pleurx placement if it is needed and patient reports it is definitely something he would consider.   Patient is inpatient appropriate due to need for further testing and treatment of fluid overload.   Vital Signs: 98/79/18    107/79    spO2 100% room air I&O: not yet recorded Abnormal labs: Na+ 127, K+ 5.4, CO2 18, BUN 41, Creatinine 1.66, Ca+ 8.2, Albumin 2.6, AST 43, GFR 47, BNP 1029, CRP 14.2 Diagnostics:  CHEST - 2 VIEW IMPRESSION: No active cardiopulmonary disease. Electronically Signed   By: Minerva Fester M.D.   On: 07/29/2023 02:22 IV/PRN Meds: N/A Problem List:  DOE (dyspnea on exertion) Is a chronic issue for the patient, however it is especially worse over the last 4 days likely due to worsening fluid status in the setting of patient's diuretics having being held in the last hospitalization due to his low blood pressure.  At this time patient's blood pressure still remains soft.  However when I saw the patient his blood pressure systolic was at least over 100.  I will trial low-dose of Lasix initiation and see if that helps patient's fluid status as well as dyspnea.  Patient is not requiring supplementary oxygen at home or at this time.  Another factor may be patient's mild metabolic acidosis from  CKD.  However I will defer starting sodium bicarbonate just yet so as to not worsen patient's fluid status.  I will also put in some fluid restrictions for patient   Abdominal distention Consistent with ascites. I think this is part and parcel of patient's systemic fluid overload from his prior known liver disease as well as ischemic cardiomyopathy as well as cardiac amyloidosis.  We can consider paracentesis, however given that patient is not markedly symptomatic per se from the abdominal distention, I want to trial diuretics first.  And see if that helps the patient symptomatically.  Note that patient was discharged discharged with antibiotic on last hospitalization because he was diagnosed with possible spontaneous bacterial peritonitis   Ischemic cardiomyopathy As well as amylodoidsis. C.w. amvuttra after DC. Resume Vyndamax if patient bring it from home.    Benign prostatic hyperplasia with lower urinary tract symptoms Not currently an acute issue.  From DC summary on 9/30: Foley and Flomax.  Will be discharged with Foley catheter.  Currently on hospice.  If desired outpatient urology follow-up for Foley removal in 1 to 2 weeks.       Abnormal findings on diagnostic imaging of liver and biliary tract Patient was diagnosed with cirrhosis, possible hepatorenal syndrome, possible SBP in last hospitalizations.  See GI note from July 22, 2023.  It does not look like the etiology of cirrhosis was further pursued as patient opted for home hospice.  Patient reiterated his goals of care to me at this  time.   CKD (chronic kidney disease), stage II Creatinine seems to be at baseline which fluctuates around 2, patient has chronic hyponatremia, noted to have mild hyperkalemia this morning.  I will treat with some Lokelma.  Patient seems to have chronic metabolic acidosis with normal anion gap, likely due to CKD, chronic trop elevation likely again due to CKD   PAF (paroxysmal atrial fibrillation)  (HCC) Patient's anticoagulation was held in last hospitalization because patient has previously had intramuscular hematoma formation and also his ascitic tap was markedly bloody.  Discharge Planning: Ongoing Family Contact: Talked with patient at bedside, he is alert and oriented IDT: Updated Goals of Care: DNR Should patient need ambulance transfer at discharge- please use [facility]) as they contract this service for our active hospice patients. Thea Gist, BSN RN Hospice hospital liaison [phone]

## 2023-07-29 NOTE — Plan of Care (Signed)
  Problem: Education: Goal: Knowledge of General Education information will improve Description: Including pain rating scale, medication(s)/side effects and non-pharmacologic comfort measures Outcome: Progressing   Problem: Health Behavior/Discharge Planning: Goal: Ability to manage health-related needs will improve Outcome: Progressing   Problem: Clinical Measurements: Goal: Ability to maintain clinical measurements within normal limits will improve Outcome: Progressing Goal: Will remain free from infection Outcome: Progressing Goal: Diagnostic test results will improve Outcome: Progressing Goal: Respiratory complications will improve Outcome: Progressing Goal: Cardiovascular complication will be avoided Outcome: Progressing   Problem: Activity: Goal: Risk for activity intolerance will decrease Outcome: Progressing   Problem: Nutrition: Goal: Adequate nutrition will be maintained Outcome: Progressing   Problem: Coping: Goal: Level of anxiety will decrease Outcome: Progressing   Problem: Elimination: Goal: Will not experience complications related to bowel motility Outcome: Progressing Goal: Will not experience complications related to urinary retention Outcome: Progressing   Problem: Pain Managment: Goal: General experience of comfort will improve Outcome: Progressing   Problem: Safety: Goal: Ability to remain free from injury will improve Outcome: Progressing   Problem: Skin Integrity: Goal: Risk for impaired skin integrity will decrease Outcome: Progressing   Problem: Education: Goal: Knowledge of disease and its progression will improve Outcome: Progressing Goal: Individualized Educational Video(s) Outcome: Progressing   Problem: Fluid Volume: Goal: Compliance with measures to maintain balanced fluid volume will improve Outcome: Progressing   Problem: Health Behavior/Discharge Planning: Goal: Ability to manage health-related needs will  improve Outcome: Progressing   Problem: Nutritional: Goal: Ability to make healthy dietary choices will improve Outcome: Progressing   Problem: Clinical Measurements: Goal: Complications related to the disease process, condition or treatment will be avoided or minimized Outcome: Progressing   Problem: Education: Goal: Ability to describe self-care measures that may prevent or decrease complications (Diabetes Survival Skills Education) will improve Outcome: Progressing Goal: Individualized Educational Video(s) Outcome: Progressing   Problem: Coping: Goal: Ability to adjust to condition or change in health will improve Outcome: Progressing   Problem: Fluid Volume: Goal: Ability to maintain a balanced intake and output will improve Outcome: Progressing   Problem: Health Behavior/Discharge Planning: Goal: Ability to identify and utilize available resources and services will improve Outcome: Progressing Goal: Ability to manage health-related needs will improve Outcome: Progressing   Problem: Metabolic: Goal: Ability to maintain appropriate glucose levels will improve Outcome: Progressing   Problem: Nutritional: Goal: Maintenance of adequate nutrition will improve Outcome: Progressing Goal: Progress toward achieving an optimal weight will improve Outcome: Progressing   Problem: Skin Integrity: Goal: Risk for impaired skin integrity will decrease Outcome: Progressing   Problem: Tissue Perfusion: Goal: Adequacy of tissue perfusion will improve Outcome: Progressing

## 2023-07-29 NOTE — Assessment & Plan Note (Signed)
Patient's anticoagulation was held in last hospitalization because patient has previously had intramuscular hematoma formation and also his ascitic tap was markedly bloody.

## 2023-07-29 NOTE — Assessment & Plan Note (Addendum)
Creatinine seems to be at baseline which fluctuates around 2, patient has chronic hyponatremia, noted to have mild hyperkalemia this morning.  I will treat with some Lokelma.  Patient seems to have chronic metabolic acidosis with normal anion gap, likely due to CKD, chronic trop elevation likely again due to CKD

## 2023-07-30 DIAGNOSIS — I130 Hypertensive heart and chronic kidney disease with heart failure and stage 1 through stage 4 chronic kidney disease, or unspecified chronic kidney disease: Secondary | ICD-10-CM | POA: Diagnosis present

## 2023-07-30 DIAGNOSIS — E785 Hyperlipidemia, unspecified: Secondary | ICD-10-CM | POA: Diagnosis present

## 2023-07-30 DIAGNOSIS — N1832 Chronic kidney disease, stage 3b: Secondary | ICD-10-CM | POA: Diagnosis present

## 2023-07-30 DIAGNOSIS — R0602 Shortness of breath: Secondary | ICD-10-CM | POA: Diagnosis present

## 2023-07-30 DIAGNOSIS — E854 Organ-limited amyloidosis: Secondary | ICD-10-CM | POA: Diagnosis present

## 2023-07-30 DIAGNOSIS — E852 Heredofamilial amyloidosis, unspecified: Secondary | ICD-10-CM | POA: Diagnosis present

## 2023-07-30 DIAGNOSIS — K7460 Unspecified cirrhosis of liver: Secondary | ICD-10-CM | POA: Diagnosis present

## 2023-07-30 DIAGNOSIS — R0609 Other forms of dyspnea: Secondary | ICD-10-CM

## 2023-07-30 DIAGNOSIS — I43 Cardiomyopathy in diseases classified elsewhere: Secondary | ICD-10-CM | POA: Diagnosis present

## 2023-07-30 DIAGNOSIS — Z515 Encounter for palliative care: Secondary | ICD-10-CM | POA: Diagnosis not present

## 2023-07-30 DIAGNOSIS — Z952 Presence of prosthetic heart valve: Secondary | ICD-10-CM | POA: Diagnosis not present

## 2023-07-30 DIAGNOSIS — E1142 Type 2 diabetes mellitus with diabetic polyneuropathy: Secondary | ICD-10-CM | POA: Diagnosis present

## 2023-07-30 DIAGNOSIS — E8722 Chronic metabolic acidosis: Secondary | ICD-10-CM | POA: Diagnosis present

## 2023-07-30 DIAGNOSIS — E871 Hypo-osmolality and hyponatremia: Secondary | ICD-10-CM | POA: Diagnosis present

## 2023-07-30 DIAGNOSIS — R188 Other ascites: Secondary | ICD-10-CM | POA: Diagnosis present

## 2023-07-30 DIAGNOSIS — Z66 Do not resuscitate: Secondary | ICD-10-CM | POA: Diagnosis present

## 2023-07-30 DIAGNOSIS — E8770 Fluid overload, unspecified: Secondary | ICD-10-CM | POA: Diagnosis present

## 2023-07-30 DIAGNOSIS — E1122 Type 2 diabetes mellitus with diabetic chronic kidney disease: Secondary | ICD-10-CM | POA: Diagnosis present

## 2023-07-30 DIAGNOSIS — I480 Paroxysmal atrial fibrillation: Secondary | ICD-10-CM | POA: Diagnosis present

## 2023-07-30 DIAGNOSIS — G4733 Obstructive sleep apnea (adult) (pediatric): Secondary | ICD-10-CM | POA: Diagnosis present

## 2023-07-30 DIAGNOSIS — I509 Heart failure, unspecified: Secondary | ICD-10-CM | POA: Diagnosis present

## 2023-07-30 DIAGNOSIS — K219 Gastro-esophageal reflux disease without esophagitis: Secondary | ICD-10-CM | POA: Diagnosis present

## 2023-07-30 DIAGNOSIS — K7210 Chronic hepatic failure without coma: Secondary | ICD-10-CM | POA: Diagnosis present

## 2023-07-30 DIAGNOSIS — K767 Hepatorenal syndrome: Secondary | ICD-10-CM | POA: Diagnosis present

## 2023-07-30 DIAGNOSIS — N400 Enlarged prostate without lower urinary tract symptoms: Secondary | ICD-10-CM | POA: Diagnosis present

## 2023-07-30 DIAGNOSIS — I2510 Atherosclerotic heart disease of native coronary artery without angina pectoris: Secondary | ICD-10-CM | POA: Diagnosis present

## 2023-07-30 LAB — PROTIME-INR
INR: 1.4 — ABNORMAL HIGH (ref 0.8–1.2)
Prothrombin Time: 17.3 s — ABNORMAL HIGH (ref 11.4–15.2)

## 2023-07-30 LAB — CBC
HCT: 30.2 % — ABNORMAL LOW (ref 39.0–52.0)
Hemoglobin: 10 g/dL — ABNORMAL LOW (ref 13.0–17.0)
MCH: 27.9 pg (ref 26.0–34.0)
MCHC: 33.1 g/dL (ref 30.0–36.0)
MCV: 84.4 fL (ref 80.0–100.0)
Platelets: 359 10*3/uL (ref 150–400)
RBC: 3.58 MIL/uL — ABNORMAL LOW (ref 4.22–5.81)
RDW: 17.6 % — ABNORMAL HIGH (ref 11.5–15.5)
WBC: 6.5 10*3/uL (ref 4.0–10.5)
nRBC: 0 % (ref 0.0–0.2)

## 2023-07-30 LAB — BASIC METABOLIC PANEL
Anion gap: 10 (ref 5–15)
BUN: 39 mg/dL — ABNORMAL HIGH (ref 8–23)
CO2: 17 mmol/L — ABNORMAL LOW (ref 22–32)
Calcium: 8.2 mg/dL — ABNORMAL LOW (ref 8.9–10.3)
Chloride: 99 mmol/L (ref 98–111)
Creatinine, Ser: 1.6 mg/dL — ABNORMAL HIGH (ref 0.61–1.24)
GFR, Estimated: 46 mL/min — ABNORMAL LOW (ref >60)
Glucose, Bld: 117 mg/dL — ABNORMAL HIGH (ref 70–99)
Potassium: 4.6 mmol/L (ref 3.5–5.1)
Sodium: 126 mmol/L — ABNORMAL LOW (ref 135–145)

## 2023-07-30 LAB — APTT: aPTT: 36 s (ref 24–36)

## 2023-07-30 MED ORDER — ALBUMIN HUMAN 25 % IV SOLN
12.5000 g | Freq: Once | INTRAVENOUS | Status: AC
  Administered 2023-07-30: 12.5 g via INTRAVENOUS
  Filled 2023-07-30: qty 50

## 2023-07-30 NOTE — Progress Notes (Signed)
Frank Berg 1O10- [facility] hospitalized hospice patient visit   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 He was presented to the ED after 2 days of worsening shortness of breath. He was admitted 0n 10.5 due to Dyspnea and abdominal distention. [facility] was notified after patient went to the ED. Per Dr. Kirt Boys with [facility] this is a related admission.    Visited with patient at bedside.  He states he is "feeling ok" but denies having pain.  Complains of his abdomen being swollen but denies any feelings of pressure or pain.  Plan is for patient to have paracentesis versus pleurx catheter inserted tomorrow in Interventional Radiology.  Wife is not at bedside but per patient, she is informed on plan.  Will continue to follow for discharge planning.   Patient is inpatient appropriate due to need for further testing and treatment of fluid overload.    Vital Signs: 98.3/80/18    95/73    spO2 98% room air I&O: 720/1050 Abnormal labs: NRBC 3.58, Hgb 10.0, HCT 30.2, Sodium 126, CO2 17, BUN 39, Creatinine 1.60, Calcium 8.2, Troponin 57, INR 1.4, BNP 1029 Diagnostics: none new    IV/PRN Meds: lorazepam 1mg  PO x 1, albumin human 25% 12.5g x 1 Problem List: Assessment and plan: Volume retention Hypotension Presented with edema, dyspnea on exertion in the setting of end-stage liver cirrhosis.  Diuretics were held due to low blood pressure on discharge with home hospice last week  Given IV Lasix in the ED. Could repeat IV Lasix but blood pressure is low.  I would hold off IV or oral Lasix for now. Not requiring supplemental oxygen   End-stage liver cirrhosis  ascites Can try paracentesis for comfort.  Ultrasound paracentesis ordered May benefit from Pleurx catheter.  IR consulted and requested   Ischemic cardiomyopathy Cardiac amyloidosis Cardiac meds were stopped in last hospital stay. Tafamidis was reordered  on admission.   PAF Patient's anticoagulation was held in last hospitalization because patient has previously had intramuscular hematoma formation and also his ascitic tap was markedly bloody.   Benign prostatic hyperplasia  Foley catheter in situ Was discharged with Foley catheter on 9/30 Okay to maintain for comfort   CKD 2 Hepatorenal syndrome Creatinine seems to be at baseline which fluctuates around 2, patient has chronic hyponatremia, noted to have mild hyperkalemia this morning.  I will treat with some Lokelma.  Patient seems to have chronic metabolic acidosis with normal anion gap, likely due to CKD, chronic trop elevation likely again due to CKD    Comfort meds ordered. Home med rec revewied   Mobility: Able to ambulate at home   Discharge Planning: Ongoing Family Contact: Talked with patient at bedside, he is alert and oriented.  Per patient, wife aware of plan. IDT: Updated Goals of Care: DNR Should patient need ambulance transfer at discharge- please use [facility]) as they contract this service for our active hospice patients. Doreatha Martin, RN, [facility] [phone]

## 2023-07-30 NOTE — Consult Note (Cosign Needed Addendum)
not discretely seen. Lymphatic: No enlarged abdominal or pelvic lymph nodes. Reproductive: Enlargement of the prostate with median lobe hypertrophy. Other: Small volume ascites.  No free air or fluid collection. Musculoskeletal: No acute or abnormal lytic or blastic osseous lesions. Multilevel degenerative changes of the partially imaged thoracic and lumbar spine. Partially imaged median sternotomy wires are nondisplaced. Mild body wall edema. Asymmetrically enlarged left rectus abdominis muscle with a focus of active contrast extravasation (14:93). IMPRESSION: 1. No findings of active GI bleed. 2. Asymmetrically enlarged left rectus abdominis muscle with a focus of active contrast extravasation, consistent with intramuscular hematoma. 3. Cirrhotic morphology of the liver with ill-defined hypoattenuation measuring 1.1 cm in segment 2/3, which is  indeterminate. Recommend nonemergent liver protocol MRI for further evaluation. 4. Small volume ascites. 5. Trace right pleural effusion. 6. Multichamber cardiomegaly with reflux of contrast material into the hepatic veins, suggesting a degree of right heart dysfunction. 7. Aortic Atherosclerosis (ICD10-I70.0). Coronary artery calcifications. Assessment for potential risk factor modification, dietary therapy or pharmacologic therapy may be warranted, if clinically indicated. Electronically Signed   By: Frank Berg M.D.   On: 07/14/2023 20:34   Split night study  Result Date: 07/09/2023 Frank Novas, MD     07/22/2023 12:11 PM  [facility] POLYSOMNOGRAPHY  INTERPRETATION REPORT STUDY DATE:  07/09/2023  PATIENT NAME:  Frank Berg        DATE OF BIRTH:  December 05, 1955 PATIENT ID:  [id number]    TYPE OF STUDY:  PSG READING PHYSICIAN: Frank Novas, MD REFERRED BY: Frank Alert, NP PCP :  Frank Berg Neurologist : Frank Berg Cardiologist: Frank Berg  SCORING TECHNICIAN: Frank Berg, RPSGT HISTORY:  Frank Berg is 67 year-old Male patient on CPAP who feels that his PAP machine is not longer providing enough pressure. This patient was seen by Frank Dowdy, NP, who ordered this study as a SPLIT on 06-12-2023. There was not enough sleep time to split this study- New machine needed by date of issue. This patient's medication list includes Ambien.  This patient carries a dx of amyloid neuropathy, followed by specialist clinic at [facility], Neuromuscular division. 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 Gala Berg) for cardiomyopathy. Remains on Tafamidis ADDITIONAL INFORMATION:   The Epworth Sleepiness Scale endorsed at 4 /24 points (scores above or equal to 10 are suggestive of hypersomnolence). Height: 74 in Weight: 167 lb (BMI 21) Neck Size: 0 in  TECHNICAL DESCRIPTION: A registered sleep technologist ( RPSGT)  was in attendance for the duration of the recording.  Data collection, scoring, video monitoring, and reporting were performed in compliance with the AASM Manual for the Scoring of Sleep and Associated Events; (Hypopnea is scored based on the criteria listed in Section VIII D. 1b in the AASM Manual V2.6 using a 4% oxygen desaturation rule or Hypopnea is scored based on the criteria listed in Section VIII D. 1a in the AASM Manual V2.6 using 3% oxygen desaturation and /or arousal rule). SLEEP CONTINUITY AND SLEEP ARCHITECTURE:  Lights-out was at 22:11: and lights-on at  04:52:, with  6.7 hours of recording time . Total sleep time ( TST) was 89.5 minutes with a decreased sleep efficiency at 22.3%. BODY POSITION:  TST was 100.0% supine. Sleep latency was increased at 63.0  not discretely seen. Lymphatic: No enlarged abdominal or pelvic lymph nodes. Reproductive: Enlargement of the prostate with median lobe hypertrophy. Other: Small volume ascites.  No free air or fluid collection. Musculoskeletal: No acute or abnormal lytic or blastic osseous lesions. Multilevel degenerative changes of the partially imaged thoracic and lumbar spine. Partially imaged median sternotomy wires are nondisplaced. Mild body wall edema. Asymmetrically enlarged left rectus abdominis muscle with a focus of active contrast extravasation (14:93). IMPRESSION: 1. No findings of active GI bleed. 2. Asymmetrically enlarged left rectus abdominis muscle with a focus of active contrast extravasation, consistent with intramuscular hematoma. 3. Cirrhotic morphology of the liver with ill-defined hypoattenuation measuring 1.1 cm in segment 2/3, which is  indeterminate. Recommend nonemergent liver protocol MRI for further evaluation. 4. Small volume ascites. 5. Trace right pleural effusion. 6. Multichamber cardiomegaly with reflux of contrast material into the hepatic veins, suggesting a degree of right heart dysfunction. 7. Aortic Atherosclerosis (ICD10-I70.0). Coronary artery calcifications. Assessment for potential risk factor modification, dietary therapy or pharmacologic therapy may be warranted, if clinically indicated. Electronically Signed   By: Frank Berg M.D.   On: 07/14/2023 20:34   Split night study  Result Date: 07/09/2023 Frank Novas, MD     07/22/2023 12:11 PM  [facility] POLYSOMNOGRAPHY  INTERPRETATION REPORT STUDY DATE:  07/09/2023  PATIENT NAME:  Frank Berg        DATE OF BIRTH:  December 05, 1955 PATIENT ID:  [id number]    TYPE OF STUDY:  PSG READING PHYSICIAN: Frank Novas, MD REFERRED BY: Frank Alert, NP PCP :  Frank Berg Neurologist : Frank Berg Cardiologist: Frank Berg  SCORING TECHNICIAN: Frank Berg, RPSGT HISTORY:  Frank Berg is 67 year-old Male patient on CPAP who feels that his PAP machine is not longer providing enough pressure. This patient was seen by Frank Dowdy, NP, who ordered this study as a SPLIT on 06-12-2023. There was not enough sleep time to split this study- New machine needed by date of issue. This patient's medication list includes Ambien.  This patient carries a dx of amyloid neuropathy, followed by specialist clinic at [facility], Neuromuscular division. 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 Gala Berg) for cardiomyopathy. Remains on Tafamidis ADDITIONAL INFORMATION:   The Epworth Sleepiness Scale endorsed at 4 /24 points (scores above or equal to 10 are suggestive of hypersomnolence). Height: 74 in Weight: 167 lb (BMI 21) Neck Size: 0 in  TECHNICAL DESCRIPTION: A registered sleep technologist ( RPSGT)  was in attendance for the duration of the recording.  Data collection, scoring, video monitoring, and reporting were performed in compliance with the AASM Manual for the Scoring of Sleep and Associated Events; (Hypopnea is scored based on the criteria listed in Section VIII D. 1b in the AASM Manual V2.6 using a 4% oxygen desaturation rule or Hypopnea is scored based on the criteria listed in Section VIII D. 1a in the AASM Manual V2.6 using 3% oxygen desaturation and /or arousal rule). SLEEP CONTINUITY AND SLEEP ARCHITECTURE:  Lights-out was at 22:11: and lights-on at  04:52:, with  6.7 hours of recording time . Total sleep time ( TST) was 89.5 minutes with a decreased sleep efficiency at 22.3%. BODY POSITION:  TST was 100.0% supine. Sleep latency was increased at 63.0  minutes.  REM sleep latency was decreased at 0.0 minutes. Of the total sleep time, the percentage of stage N1 sleep was 58.7%, stage N2 sleep was 41%, stage N3 sleep was 0.0%, and REM sleep was 0.0%. RESPIRATORY MONITORING: Based on CMS criteria (using a 4% oxygen desaturation rule for scoring hypopneas), there were 66 apneas (0 obstructive; 50 central; 16 mixed), and 3 hypopneas.  Apnea index was 44.2. Hypopnea index was 2.0. The apnea-hypopnea index was 46.3 overall (46.3 supine, 0 non-supine; 0.0 REM, 0.0 supine REM). OXIMETRY: Oxyhemoglobin Saturation Nadir during sleep was at  90%) from a mean of 99%.  Of the Total sleep time (TST)  hypoxemia (=<88%) was present for  0.0 minutes, or 0.0% of total sleep time. LIMB MOVEMENTS: There were 4 periodic limb movements of sleep (2.7/hr), of which 1 (0.7/hr) were associated with an arousal. AROUSAL: There were 69 arousals in total, for an arousal index of 46  arousals/hour.  Of these, 51 were identified as respiratory-related arousals (34 /h), 1 were PLM-related arousals (1 /h), and 37 were non-specific arousals (25 /h). EEG:  PSG EEG was of normal amplitude and frequency, with symmetric manifestation of sleep stages. EKG: The electrocardiogram documented irregular shaped QRS complexes and R to R intervals. AUDIO and VIDEO: IMPRESSION: CENTRAL Sleep disordered breathing was present. The patient was unable to sleep (in spite of Ambien?)  This study was ordered as a PSG, so a titration was not attempted. The patient will likely need at least BiPAP ST, and most likely ASV with this proportion of central apnea.  I ordered a return study (in-lab) for this special titration. The patient will need to bring his sleep aid with him as should all sleep patients.   Total sleep time was reduced at 89.5 minutes.  Sleep efficiency was decreased at 22.3%.  RECOMMENDATIONS: Frank Novas, MD  General Information Name: Locke, Barrell BMI: 21.44 Physician: Frank Novas, MD ID: [id number] Height: 74.0 in Technician: Frank Berg, RPSGT Sex: Male Weight: 167.0 lb Record: [id number] Age: 1 [12-03-1955] Date: 07/09/2023   Medical & Medication History per Butch Penny, NP    Mr. Berthold is a 67 year old male with a history of obstructive sleep apnea on CPAP. He reports that he has been having shortness of breath. He is seeing cardiologist for heart failure. He reports that they are adjusting his medications. He states that night he feels that he is not getting enough pressure when he puts the mask on. Wife reports that he does need a new machine. Zyloprim, colchicine, Jardiance, Ensure Max Protein, iron, Claritin, Proamatine, Remeron, Multivitamins w/minerals, Xarelto, Crestor, Adcirca, Vyndamax, Demadex, Ambien  Sleep Disorder    Comments  Patient arrived for a diagnostic polysomnogram. Procedure explained and all questions answered. Standard paste setup without complications.  Patient sleep efficiency was greatly reduced at 22.32%. After 2.5 hours recording time, patient's total sleep time = 22 fragmented minutes. Patient slept supine. Mild snoring heard. Respiratory events observed, many central events as well as mixed events. Cardiac arrhythmias observed. Patient has a known cardiac history. No significant PLMS observed. No restroom visits for nocturia, as patient is using a [place] catheter.   Lights out: 10:11:23 PM Lights on: 04:52:25 AM Time Total Supine Side Prone Upright Recording (TRT) 6h 41.106m 6h 33.24m 0h 7.33m 0h 0.51m 0h 0.82m Sleep (TST) 1h 29.91m 1h 29.36m 0h 0.45m 0h 0.81m 0h 0.39m Latency N1 N2 N3 REM Onset Per. Slp. Eff. Actual 1h 3.4m 2h 2.65m 0h 0.43m 0h 0.101m 1h 3.26m 2h 3.16m  not discretely seen. Lymphatic: No enlarged abdominal or pelvic lymph nodes. Reproductive: Enlargement of the prostate with median lobe hypertrophy. Other: Small volume ascites.  No free air or fluid collection. Musculoskeletal: No acute or abnormal lytic or blastic osseous lesions. Multilevel degenerative changes of the partially imaged thoracic and lumbar spine. Partially imaged median sternotomy wires are nondisplaced. Mild body wall edema. Asymmetrically enlarged left rectus abdominis muscle with a focus of active contrast extravasation (14:93). IMPRESSION: 1. No findings of active GI bleed. 2. Asymmetrically enlarged left rectus abdominis muscle with a focus of active contrast extravasation, consistent with intramuscular hematoma. 3. Cirrhotic morphology of the liver with ill-defined hypoattenuation measuring 1.1 cm in segment 2/3, which is  indeterminate. Recommend nonemergent liver protocol MRI for further evaluation. 4. Small volume ascites. 5. Trace right pleural effusion. 6. Multichamber cardiomegaly with reflux of contrast material into the hepatic veins, suggesting a degree of right heart dysfunction. 7. Aortic Atherosclerosis (ICD10-I70.0). Coronary artery calcifications. Assessment for potential risk factor modification, dietary therapy or pharmacologic therapy may be warranted, if clinically indicated. Electronically Signed   By: Frank Berg M.D.   On: 07/14/2023 20:34   Split night study  Result Date: 07/09/2023 Frank Novas, MD     07/22/2023 12:11 PM  [facility] POLYSOMNOGRAPHY  INTERPRETATION REPORT STUDY DATE:  07/09/2023  PATIENT NAME:  Frank Berg        DATE OF BIRTH:  December 05, 1955 PATIENT ID:  [id number]    TYPE OF STUDY:  PSG READING PHYSICIAN: Frank Novas, MD REFERRED BY: Frank Alert, NP PCP :  Frank Berg Neurologist : Frank Berg Cardiologist: Frank Berg  SCORING TECHNICIAN: Frank Berg, RPSGT HISTORY:  Frank Berg is 67 year-old Male patient on CPAP who feels that his PAP machine is not longer providing enough pressure. This patient was seen by Frank Dowdy, NP, who ordered this study as a SPLIT on 06-12-2023. There was not enough sleep time to split this study- New machine needed by date of issue. This patient's medication list includes Ambien.  This patient carries a dx of amyloid neuropathy, followed by specialist clinic at [facility], Neuromuscular division. 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 Gala Berg) for cardiomyopathy. Remains on Tafamidis ADDITIONAL INFORMATION:   The Epworth Sleepiness Scale endorsed at 4 /24 points (scores above or equal to 10 are suggestive of hypersomnolence). Height: 74 in Weight: 167 lb (BMI 21) Neck Size: 0 in  TECHNICAL DESCRIPTION: A registered sleep technologist ( RPSGT)  was in attendance for the duration of the recording.  Data collection, scoring, video monitoring, and reporting were performed in compliance with the AASM Manual for the Scoring of Sleep and Associated Events; (Hypopnea is scored based on the criteria listed in Section VIII D. 1b in the AASM Manual V2.6 using a 4% oxygen desaturation rule or Hypopnea is scored based on the criteria listed in Section VIII D. 1a in the AASM Manual V2.6 using 3% oxygen desaturation and /or arousal rule). SLEEP CONTINUITY AND SLEEP ARCHITECTURE:  Lights-out was at 22:11: and lights-on at  04:52:, with  6.7 hours of recording time . Total sleep time ( TST) was 89.5 minutes with a decreased sleep efficiency at 22.3%. BODY POSITION:  TST was 100.0% supine. Sleep latency was increased at 63.0  minutes.  REM sleep latency was decreased at 0.0 minutes. Of the total sleep time, the percentage of stage N1 sleep was 58.7%, stage N2 sleep was 41%, stage N3 sleep was 0.0%, and REM sleep was 0.0%. RESPIRATORY MONITORING: Based on CMS criteria (using a 4% oxygen desaturation rule for scoring hypopneas), there were 66 apneas (0 obstructive; 50 central; 16 mixed), and 3 hypopneas.  Apnea index was 44.2. Hypopnea index was 2.0. The apnea-hypopnea index was 46.3 overall (46.3 supine, 0 non-supine; 0.0 REM, 0.0 supine REM). OXIMETRY: Oxyhemoglobin Saturation Nadir during sleep was at  90%) from a mean of 99%.  Of the Total sleep time (TST)  hypoxemia (=<88%) was present for  0.0 minutes, or 0.0% of total sleep time. LIMB MOVEMENTS: There were 4 periodic limb movements of sleep (2.7/hr), of which 1 (0.7/hr) were associated with an arousal. AROUSAL: There were 69 arousals in total, for an arousal index of 46  arousals/hour.  Of these, 51 were identified as respiratory-related arousals (34 /h), 1 were PLM-related arousals (1 /h), and 37 were non-specific arousals (25 /h). EEG:  PSG EEG was of normal amplitude and frequency, with symmetric manifestation of sleep stages. EKG: The electrocardiogram documented irregular shaped QRS complexes and R to R intervals. AUDIO and VIDEO: IMPRESSION: CENTRAL Sleep disordered breathing was present. The patient was unable to sleep (in spite of Ambien?)  This study was ordered as a PSG, so a titration was not attempted. The patient will likely need at least BiPAP ST, and most likely ASV with this proportion of central apnea.  I ordered a return study (in-lab) for this special titration. The patient will need to bring his sleep aid with him as should all sleep patients.   Total sleep time was reduced at 89.5 minutes.  Sleep efficiency was decreased at 22.3%.  RECOMMENDATIONS: Frank Novas, MD  General Information Name: Locke, Barrell BMI: 21.44 Physician: Frank Novas, MD ID: [id number] Height: 74.0 in Technician: Frank Berg, RPSGT Sex: Male Weight: 167.0 lb Record: [id number] Age: 1 [12-03-1955] Date: 07/09/2023   Medical & Medication History per Butch Penny, NP    Mr. Berthold is a 67 year old male with a history of obstructive sleep apnea on CPAP. He reports that he has been having shortness of breath. He is seeing cardiologist for heart failure. He reports that they are adjusting his medications. He states that night he feels that he is not getting enough pressure when he puts the mask on. Wife reports that he does need a new machine. Zyloprim, colchicine, Jardiance, Ensure Max Protein, iron, Claritin, Proamatine, Remeron, Multivitamins w/minerals, Xarelto, Crestor, Adcirca, Vyndamax, Demadex, Ambien  Sleep Disorder    Comments  Patient arrived for a diagnostic polysomnogram. Procedure explained and all questions answered. Standard paste setup without complications.  Patient sleep efficiency was greatly reduced at 22.32%. After 2.5 hours recording time, patient's total sleep time = 22 fragmented minutes. Patient slept supine. Mild snoring heard. Respiratory events observed, many central events as well as mixed events. Cardiac arrhythmias observed. Patient has a known cardiac history. No significant PLMS observed. No restroom visits for nocturia, as patient is using a [place] catheter.   Lights out: 10:11:23 PM Lights on: 04:52:25 AM Time Total Supine Side Prone Upright Recording (TRT) 6h 41.106m 6h 33.24m 0h 7.33m 0h 0.51m 0h 0.82m Sleep (TST) 1h 29.91m 1h 29.36m 0h 0.45m 0h 0.81m 0h 0.39m Latency N1 N2 N3 REM Onset Per. Slp. Eff. Actual 1h 3.4m 2h 2.65m 0h 0.43m 0h 0.101m 1h 3.26m 2h 3.16m  not discretely seen. Lymphatic: No enlarged abdominal or pelvic lymph nodes. Reproductive: Enlargement of the prostate with median lobe hypertrophy. Other: Small volume ascites.  No free air or fluid collection. Musculoskeletal: No acute or abnormal lytic or blastic osseous lesions. Multilevel degenerative changes of the partially imaged thoracic and lumbar spine. Partially imaged median sternotomy wires are nondisplaced. Mild body wall edema. Asymmetrically enlarged left rectus abdominis muscle with a focus of active contrast extravasation (14:93). IMPRESSION: 1. No findings of active GI bleed. 2. Asymmetrically enlarged left rectus abdominis muscle with a focus of active contrast extravasation, consistent with intramuscular hematoma. 3. Cirrhotic morphology of the liver with ill-defined hypoattenuation measuring 1.1 cm in segment 2/3, which is  indeterminate. Recommend nonemergent liver protocol MRI for further evaluation. 4. Small volume ascites. 5. Trace right pleural effusion. 6. Multichamber cardiomegaly with reflux of contrast material into the hepatic veins, suggesting a degree of right heart dysfunction. 7. Aortic Atherosclerosis (ICD10-I70.0). Coronary artery calcifications. Assessment for potential risk factor modification, dietary therapy or pharmacologic therapy may be warranted, if clinically indicated. Electronically Signed   By: Frank Berg M.D.   On: 07/14/2023 20:34   Split night study  Result Date: 07/09/2023 Frank Novas, MD     07/22/2023 12:11 PM  [facility] POLYSOMNOGRAPHY  INTERPRETATION REPORT STUDY DATE:  07/09/2023  PATIENT NAME:  Frank Berg        DATE OF BIRTH:  December 05, 1955 PATIENT ID:  [id number]    TYPE OF STUDY:  PSG READING PHYSICIAN: Frank Novas, MD REFERRED BY: Frank Alert, NP PCP :  Frank Berg Neurologist : Frank Berg Cardiologist: Frank Berg  SCORING TECHNICIAN: Frank Berg, RPSGT HISTORY:  Frank Berg is 67 year-old Male patient on CPAP who feels that his PAP machine is not longer providing enough pressure. This patient was seen by Frank Dowdy, NP, who ordered this study as a SPLIT on 06-12-2023. There was not enough sleep time to split this study- New machine needed by date of issue. This patient's medication list includes Ambien.  This patient carries a dx of amyloid neuropathy, followed by specialist clinic at [facility], Neuromuscular division. 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 Gala Berg) for cardiomyopathy. Remains on Tafamidis ADDITIONAL INFORMATION:   The Epworth Sleepiness Scale endorsed at 4 /24 points (scores above or equal to 10 are suggestive of hypersomnolence). Height: 74 in Weight: 167 lb (BMI 21) Neck Size: 0 in  TECHNICAL DESCRIPTION: A registered sleep technologist ( RPSGT)  was in attendance for the duration of the recording.  Data collection, scoring, video monitoring, and reporting were performed in compliance with the AASM Manual for the Scoring of Sleep and Associated Events; (Hypopnea is scored based on the criteria listed in Section VIII D. 1b in the AASM Manual V2.6 using a 4% oxygen desaturation rule or Hypopnea is scored based on the criteria listed in Section VIII D. 1a in the AASM Manual V2.6 using 3% oxygen desaturation and /or arousal rule). SLEEP CONTINUITY AND SLEEP ARCHITECTURE:  Lights-out was at 22:11: and lights-on at  04:52:, with  6.7 hours of recording time . Total sleep time ( TST) was 89.5 minutes with a decreased sleep efficiency at 22.3%. BODY POSITION:  TST was 100.0% supine. Sleep latency was increased at 63.0  minutes.  REM sleep latency was decreased at 0.0 minutes. Of the total sleep time, the percentage of stage N1 sleep was 58.7%, stage N2 sleep was 41%, stage N3 sleep was 0.0%, and REM sleep was 0.0%. RESPIRATORY MONITORING: Based on CMS criteria (using a 4% oxygen desaturation rule for scoring hypopneas), there were 66 apneas (0 obstructive; 50 central; 16 mixed), and 3 hypopneas.  Apnea index was 44.2. Hypopnea index was 2.0. The apnea-hypopnea index was 46.3 overall (46.3 supine, 0 non-supine; 0.0 REM, 0.0 supine REM). OXIMETRY: Oxyhemoglobin Saturation Nadir during sleep was at  90%) from a mean of 99%.  Of the Total sleep time (TST)  hypoxemia (=<88%) was present for  0.0 minutes, or 0.0% of total sleep time. LIMB MOVEMENTS: There were 4 periodic limb movements of sleep (2.7/hr), of which 1 (0.7/hr) were associated with an arousal. AROUSAL: There were 69 arousals in total, for an arousal index of 46  arousals/hour.  Of these, 51 were identified as respiratory-related arousals (34 /h), 1 were PLM-related arousals (1 /h), and 37 were non-specific arousals (25 /h). EEG:  PSG EEG was of normal amplitude and frequency, with symmetric manifestation of sleep stages. EKG: The electrocardiogram documented irregular shaped QRS complexes and R to R intervals. AUDIO and VIDEO: IMPRESSION: CENTRAL Sleep disordered breathing was present. The patient was unable to sleep (in spite of Ambien?)  This study was ordered as a PSG, so a titration was not attempted. The patient will likely need at least BiPAP ST, and most likely ASV with this proportion of central apnea.  I ordered a return study (in-lab) for this special titration. The patient will need to bring his sleep aid with him as should all sleep patients.   Total sleep time was reduced at 89.5 minutes.  Sleep efficiency was decreased at 22.3%.  RECOMMENDATIONS: Frank Novas, MD  General Information Name: Locke, Barrell BMI: 21.44 Physician: Frank Novas, MD ID: [id number] Height: 74.0 in Technician: Frank Berg, RPSGT Sex: Male Weight: 167.0 lb Record: [id number] Age: 1 [12-03-1955] Date: 07/09/2023   Medical & Medication History per Butch Penny, NP    Mr. Berthold is a 67 year old male with a history of obstructive sleep apnea on CPAP. He reports that he has been having shortness of breath. He is seeing cardiologist for heart failure. He reports that they are adjusting his medications. He states that night he feels that he is not getting enough pressure when he puts the mask on. Wife reports that he does need a new machine. Zyloprim, colchicine, Jardiance, Ensure Max Protein, iron, Claritin, Proamatine, Remeron, Multivitamins w/minerals, Xarelto, Crestor, Adcirca, Vyndamax, Demadex, Ambien  Sleep Disorder    Comments  Patient arrived for a diagnostic polysomnogram. Procedure explained and all questions answered. Standard paste setup without complications.  Patient sleep efficiency was greatly reduced at 22.32%. After 2.5 hours recording time, patient's total sleep time = 22 fragmented minutes. Patient slept supine. Mild snoring heard. Respiratory events observed, many central events as well as mixed events. Cardiac arrhythmias observed. Patient has a known cardiac history. No significant PLMS observed. No restroom visits for nocturia, as patient is using a [place] catheter.   Lights out: 10:11:23 PM Lights on: 04:52:25 AM Time Total Supine Side Prone Upright Recording (TRT) 6h 41.106m 6h 33.24m 0h 7.33m 0h 0.51m 0h 0.82m Sleep (TST) 1h 29.91m 1h 29.36m 0h 0.45m 0h 0.81m 0h 0.39m Latency N1 N2 N3 REM Onset Per. Slp. Eff. Actual 1h 3.4m 2h 2.65m 0h 0.43m 0h 0.101m 1h 3.26m 2h 3.16m  not discretely seen. Lymphatic: No enlarged abdominal or pelvic lymph nodes. Reproductive: Enlargement of the prostate with median lobe hypertrophy. Other: Small volume ascites.  No free air or fluid collection. Musculoskeletal: No acute or abnormal lytic or blastic osseous lesions. Multilevel degenerative changes of the partially imaged thoracic and lumbar spine. Partially imaged median sternotomy wires are nondisplaced. Mild body wall edema. Asymmetrically enlarged left rectus abdominis muscle with a focus of active contrast extravasation (14:93). IMPRESSION: 1. No findings of active GI bleed. 2. Asymmetrically enlarged left rectus abdominis muscle with a focus of active contrast extravasation, consistent with intramuscular hematoma. 3. Cirrhotic morphology of the liver with ill-defined hypoattenuation measuring 1.1 cm in segment 2/3, which is  indeterminate. Recommend nonemergent liver protocol MRI for further evaluation. 4. Small volume ascites. 5. Trace right pleural effusion. 6. Multichamber cardiomegaly with reflux of contrast material into the hepatic veins, suggesting a degree of right heart dysfunction. 7. Aortic Atherosclerosis (ICD10-I70.0). Coronary artery calcifications. Assessment for potential risk factor modification, dietary therapy or pharmacologic therapy may be warranted, if clinically indicated. Electronically Signed   By: Frank Berg M.D.   On: 07/14/2023 20:34   Split night study  Result Date: 07/09/2023 Frank Novas, MD     07/22/2023 12:11 PM  [facility] POLYSOMNOGRAPHY  INTERPRETATION REPORT STUDY DATE:  07/09/2023  PATIENT NAME:  Frank Berg        DATE OF BIRTH:  December 05, 1955 PATIENT ID:  [id number]    TYPE OF STUDY:  PSG READING PHYSICIAN: Frank Novas, MD REFERRED BY: Frank Alert, NP PCP :  Frank Berg Neurologist : Frank Berg Cardiologist: Frank Berg  SCORING TECHNICIAN: Frank Berg, RPSGT HISTORY:  Frank Berg is 67 year-old Male patient on CPAP who feels that his PAP machine is not longer providing enough pressure. This patient was seen by Frank Dowdy, NP, who ordered this study as a SPLIT on 06-12-2023. There was not enough sleep time to split this study- New machine needed by date of issue. This patient's medication list includes Ambien.  This patient carries a dx of amyloid neuropathy, followed by specialist clinic at [facility], Neuromuscular division. 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 Gala Berg) for cardiomyopathy. Remains on Tafamidis ADDITIONAL INFORMATION:   The Epworth Sleepiness Scale endorsed at 4 /24 points (scores above or equal to 10 are suggestive of hypersomnolence). Height: 74 in Weight: 167 lb (BMI 21) Neck Size: 0 in  TECHNICAL DESCRIPTION: A registered sleep technologist ( RPSGT)  was in attendance for the duration of the recording.  Data collection, scoring, video monitoring, and reporting were performed in compliance with the AASM Manual for the Scoring of Sleep and Associated Events; (Hypopnea is scored based on the criteria listed in Section VIII D. 1b in the AASM Manual V2.6 using a 4% oxygen desaturation rule or Hypopnea is scored based on the criteria listed in Section VIII D. 1a in the AASM Manual V2.6 using 3% oxygen desaturation and /or arousal rule). SLEEP CONTINUITY AND SLEEP ARCHITECTURE:  Lights-out was at 22:11: and lights-on at  04:52:, with  6.7 hours of recording time . Total sleep time ( TST) was 89.5 minutes with a decreased sleep efficiency at 22.3%. BODY POSITION:  TST was 100.0% supine. Sleep latency was increased at 63.0  minutes.  REM sleep latency was decreased at 0.0 minutes. Of the total sleep time, the percentage of stage N1 sleep was 58.7%, stage N2 sleep was 41%, stage N3 sleep was 0.0%, and REM sleep was 0.0%. RESPIRATORY MONITORING: Based on CMS criteria (using a 4% oxygen desaturation rule for scoring hypopneas), there were 66 apneas (0 obstructive; 50 central; 16 mixed), and 3 hypopneas.  Apnea index was 44.2. Hypopnea index was 2.0. The apnea-hypopnea index was 46.3 overall (46.3 supine, 0 non-supine; 0.0 REM, 0.0 supine REM). OXIMETRY: Oxyhemoglobin Saturation Nadir during sleep was at  90%) from a mean of 99%.  Of the Total sleep time (TST)  hypoxemia (=<88%) was present for  0.0 minutes, or 0.0% of total sleep time. LIMB MOVEMENTS: There were 4 periodic limb movements of sleep (2.7/hr), of which 1 (0.7/hr) were associated with an arousal. AROUSAL: There were 69 arousals in total, for an arousal index of 46  arousals/hour.  Of these, 51 were identified as respiratory-related arousals (34 /h), 1 were PLM-related arousals (1 /h), and 37 were non-specific arousals (25 /h). EEG:  PSG EEG was of normal amplitude and frequency, with symmetric manifestation of sleep stages. EKG: The electrocardiogram documented irregular shaped QRS complexes and R to R intervals. AUDIO and VIDEO: IMPRESSION: CENTRAL Sleep disordered breathing was present. The patient was unable to sleep (in spite of Ambien?)  This study was ordered as a PSG, so a titration was not attempted. The patient will likely need at least BiPAP ST, and most likely ASV with this proportion of central apnea.  I ordered a return study (in-lab) for this special titration. The patient will need to bring his sleep aid with him as should all sleep patients.   Total sleep time was reduced at 89.5 minutes.  Sleep efficiency was decreased at 22.3%.  RECOMMENDATIONS: Frank Novas, MD  General Information Name: Locke, Barrell BMI: 21.44 Physician: Frank Novas, MD ID: [id number] Height: 74.0 in Technician: Frank Berg, RPSGT Sex: Male Weight: 167.0 lb Record: [id number] Age: 1 [12-03-1955] Date: 07/09/2023   Medical & Medication History per Butch Penny, NP    Mr. Berthold is a 67 year old male with a history of obstructive sleep apnea on CPAP. He reports that he has been having shortness of breath. He is seeing cardiologist for heart failure. He reports that they are adjusting his medications. He states that night he feels that he is not getting enough pressure when he puts the mask on. Wife reports that he does need a new machine. Zyloprim, colchicine, Jardiance, Ensure Max Protein, iron, Claritin, Proamatine, Remeron, Multivitamins w/minerals, Xarelto, Crestor, Adcirca, Vyndamax, Demadex, Ambien  Sleep Disorder    Comments  Patient arrived for a diagnostic polysomnogram. Procedure explained and all questions answered. Standard paste setup without complications.  Patient sleep efficiency was greatly reduced at 22.32%. After 2.5 hours recording time, patient's total sleep time = 22 fragmented minutes. Patient slept supine. Mild snoring heard. Respiratory events observed, many central events as well as mixed events. Cardiac arrhythmias observed. Patient has a known cardiac history. No significant PLMS observed. No restroom visits for nocturia, as patient is using a [place] catheter.   Lights out: 10:11:23 PM Lights on: 04:52:25 AM Time Total Supine Side Prone Upright Recording (TRT) 6h 41.106m 6h 33.24m 0h 7.33m 0h 0.51m 0h 0.82m Sleep (TST) 1h 29.91m 1h 29.36m 0h 0.45m 0h 0.81m 0h 0.39m Latency N1 N2 N3 REM Onset Per. Slp. Eff. Actual 1h 3.4m 2h 2.65m 0h 0.43m 0h 0.101m 1h 3.26m 2h 3.16m  minutes.  REM sleep latency was decreased at 0.0 minutes. Of the total sleep time, the percentage of stage N1 sleep was 58.7%, stage N2 sleep was 41%, stage N3 sleep was 0.0%, and REM sleep was 0.0%. RESPIRATORY MONITORING: Based on CMS criteria (using a 4% oxygen desaturation rule for scoring hypopneas), there were 66 apneas (0 obstructive; 50 central; 16 mixed), and 3 hypopneas.  Apnea index was 44.2. Hypopnea index was 2.0. The apnea-hypopnea index was 46.3 overall (46.3 supine, 0 non-supine; 0.0 REM, 0.0 supine REM). OXIMETRY: Oxyhemoglobin Saturation Nadir during sleep was at  90%) from a mean of 99%.  Of the Total sleep time (TST)  hypoxemia (=<88%) was present for  0.0 minutes, or 0.0% of total sleep time. LIMB MOVEMENTS: There were 4 periodic limb movements of sleep (2.7/hr), of which 1 (0.7/hr) were associated with an arousal. AROUSAL: There were 69 arousals in total, for an arousal index of 46  arousals/hour.  Of these, 51 were identified as respiratory-related arousals (34 /h), 1 were PLM-related arousals (1 /h), and 37 were non-specific arousals (25 /h). EEG:  PSG EEG was of normal amplitude and frequency, with symmetric manifestation of sleep stages. EKG: The electrocardiogram documented irregular shaped QRS complexes and R to R intervals. AUDIO and VIDEO: IMPRESSION: CENTRAL Sleep disordered breathing was present. The patient was unable to sleep (in spite of Ambien?)  This study was ordered as a PSG, so a titration was not attempted. The patient will likely need at least BiPAP ST, and most likely ASV with this proportion of central apnea.  I ordered a return study (in-lab) for this special titration. The patient will need to bring his sleep aid with him as should all sleep patients.   Total sleep time was reduced at 89.5 minutes.  Sleep efficiency was decreased at 22.3%.  RECOMMENDATIONS: Frank Novas, MD  General Information Name: Locke, Barrell BMI: 21.44 Physician: Frank Novas, MD ID: [id number] Height: 74.0 in Technician: Frank Berg, RPSGT Sex: Male Weight: 167.0 lb Record: [id number] Age: 1 [12-03-1955] Date: 07/09/2023   Medical & Medication History per Butch Penny, NP    Mr. Berthold is a 67 year old male with a history of obstructive sleep apnea on CPAP. He reports that he has been having shortness of breath. He is seeing cardiologist for heart failure. He reports that they are adjusting his medications. He states that night he feels that he is not getting enough pressure when he puts the mask on. Wife reports that he does need a new machine. Zyloprim, colchicine, Jardiance, Ensure Max Protein, iron, Claritin, Proamatine, Remeron, Multivitamins w/minerals, Xarelto, Crestor, Adcirca, Vyndamax, Demadex, Ambien  Sleep Disorder    Comments  Patient arrived for a diagnostic polysomnogram. Procedure explained and all questions answered. Standard paste setup without complications.  Patient sleep efficiency was greatly reduced at 22.32%. After 2.5 hours recording time, patient's total sleep time = 22 fragmented minutes. Patient slept supine. Mild snoring heard. Respiratory events observed, many central events as well as mixed events. Cardiac arrhythmias observed. Patient has a known cardiac history. No significant PLMS observed. No restroom visits for nocturia, as patient is using a [place] catheter.   Lights out: 10:11:23 PM Lights on: 04:52:25 AM Time Total Supine Side Prone Upright Recording (TRT) 6h 41.106m 6h 33.24m 0h 7.33m 0h 0.51m 0h 0.82m Sleep (TST) 1h 29.91m 1h 29.36m 0h 0.45m 0h 0.81m 0h 0.39m Latency N1 N2 N3 REM Onset Per. Slp. Eff. Actual 1h 3.4m 2h 2.65m 0h 0.43m 0h 0.101m 1h 3.26m 2h 3.16m

## 2023-07-30 NOTE — Plan of Care (Signed)
  Problem: Education: Goal: Knowledge of General Education information will improve Description: Including pain rating scale, medication(s)/side effects and non-pharmacologic comfort measures Outcome: Progressing   Problem: Health Behavior/Discharge Planning: Goal: Ability to manage health-related needs will improve Outcome: Progressing   Problem: Clinical Measurements: Goal: Ability to maintain clinical measurements within normal limits will improve Outcome: Progressing Goal: Will remain free from infection Outcome: Progressing Goal: Diagnostic test results will improve Outcome: Progressing Goal: Respiratory complications will improve Outcome: Progressing Goal: Cardiovascular complication will be avoided Outcome: Progressing   Problem: Activity: Goal: Risk for activity intolerance will decrease Outcome: Progressing   Problem: Nutrition: Goal: Adequate nutrition will be maintained Outcome: Progressing   Problem: Coping: Goal: Level of anxiety will decrease Outcome: Progressing   Problem: Elimination: Goal: Will not experience complications related to bowel motility Outcome: Progressing Goal: Will not experience complications related to urinary retention Outcome: Progressing   Problem: Pain Managment: Goal: General experience of comfort will improve Outcome: Progressing   Problem: Safety: Goal: Ability to remain free from injury will improve Outcome: Progressing   Problem: Skin Integrity: Goal: Risk for impaired skin integrity will decrease Outcome: Progressing   Problem: Education: Goal: Knowledge of disease and its progression will improve Outcome: Progressing Goal: Individualized Educational Video(s) Outcome: Progressing   Problem: Fluid Volume: Goal: Compliance with measures to maintain balanced fluid volume will improve Outcome: Progressing   Problem: Health Behavior/Discharge Planning: Goal: Ability to manage health-related needs will  improve Outcome: Progressing   Problem: Nutritional: Goal: Ability to make healthy dietary choices will improve Outcome: Progressing   Problem: Clinical Measurements: Goal: Complications related to the disease process, condition or treatment will be avoided or minimized Outcome: Progressing   Problem: Education: Goal: Ability to describe self-care measures that may prevent or decrease complications (Diabetes Survival Skills Education) will improve Outcome: Progressing Goal: Individualized Educational Video(s) Outcome: Progressing   Problem: Coping: Goal: Ability to adjust to condition or change in health will improve Outcome: Progressing   Problem: Fluid Volume: Goal: Ability to maintain a balanced intake and output will improve Outcome: Progressing   Problem: Health Behavior/Discharge Planning: Goal: Ability to identify and utilize available resources and services will improve Outcome: Progressing Goal: Ability to manage health-related needs will improve Outcome: Progressing   Problem: Metabolic: Goal: Ability to maintain appropriate glucose levels will improve Outcome: Progressing   Problem: Nutritional: Goal: Maintenance of adequate nutrition will improve Outcome: Progressing Goal: Progress toward achieving an optimal weight will improve Outcome: Progressing   Problem: Skin Integrity: Goal: Risk for impaired skin integrity will decrease Outcome: Progressing   Problem: Tissue Perfusion: Goal: Adequacy of tissue perfusion will improve Outcome: Progressing

## 2023-07-30 NOTE — Care Management Obs Status (Signed)
MEDICARE OBSERVATION STATUS NOTIFICATION   Patient Details  Name: Frank Berg MRN: [id number] Date of Birth: 27-Nov-1955   Medicare Observation Status Notification Given:  Yes    Ronny Bacon, RN 07/30/2023, 7:27 AM

## 2023-07-30 NOTE — Progress Notes (Signed)
PROGRESS NOTE  Frank Berg  DOB: 16-Dec-1955  PCP: Adrian Prince, MD ZOX:[id number]  DOA: 07/29/2023  LOS: 0 days  Hospital Day: 2  Brief narrative: Frank Berg is a 67 y.o. male with PMH significant for end-stage liver cirrhosis, ischemic cardiomyopathy, cardiac amyloidosis, A-fib, DM2, CKD 3B, sleep apnea Patient has end-stage liver cirrhosis with ascites and was requiring recurrent paracentesis.  Recently hospitalized 9/24 to 9/30 for GI bleed, syncope, he was given blood transfusion, vitamin K, Kcentra.  Xarelto was held.  Patient was ultimately transition to comfort care and discharged home on home hospice.  At discharge, diuretics were held because she is blood pressure was running low.  10/5, patient was brought to ED with shortness of breath, and worsening generalized edema, shortness of breath.  It seems his symptoms could not be controlled adequately for him by hospice team and Finger sent to ED.  In the ED, vital signs stable Noted to have marked abdominal distention Blood pressure in 90s Labs with WBC of 6.8, hemoglobin 9.6,  Sodium 127, potassium 5.4, 07/2040/1 0.66  BNP over 1000 Admitted to [facility]  Subjective: Patient was seen and examined this morning. Alert, awake, able to have normal conversation.  No family at bedside. Blood pressure remains low in 80s  Assessment and plan: Volume retention Hypotension Presented with edema, dyspnea on exertion in the setting of end-stage liver cirrhosis.  Diuretics were held due to low blood pressure on discharge with home hospice last week  Given IV Lasix in the ED. Could repeat IV Lasix but blood pressure is low.  I would hold off IV or oral Lasix for now. Not requiring supplemental oxygen   End-stage liver cirrhosis  ascites Can try paracentesis for comfort.  Ultrasound paracentesis ordered May benefit from Pleurx catheter.  IR consulted and requested  Ischemic cardiomyopathy Cardiac amyloidosis Cardiac meds were  stopped in last hospital stay. Tafamidis was reordered on admission.   PAF Patient's anticoagulation was held in last hospitalization because patient has previously had intramuscular hematoma formation and also his ascitic tap was markedly bloody.   Benign prostatic hyperplasia  Foley catheter in situ Was discharged with Foley catheter on 9/30 Okay to maintain for comfort   CKD 2 Hepatorenal syndrome Creatinine seems to be at baseline which fluctuates around 2, patient has chronic hyponatremia, noted to have mild hyperkalemia this morning.  I will treat with some Lokelma.  Patient seems to have chronic metabolic acidosis with normal anion gap, likely due to CKD, chronic trop elevation likely again due to CKD    Comfort meds ordered. Home med rec revewied.    Mobility: Able to ambulate at home  Goals of care   Code Status: Do not attempt resuscitation (DNR) - Comfort care     DVT prophylaxis: I would avoid Lovenox given history of bleeding and hospice status SCDs Start: 07/29/23 0757   Antimicrobials: None Fluid: None  Consultants: IR Family Communication: None at bedside  Status: Observation Level of care:  Med-Surg   Patient is from: Home hospice Needs to continue in-hospital care: Pending IR evaluation for Pleurx catheter placement for recurrent ascites Anticipated d/c to: Home with hospice      Diet:  Diet Order             Diet regular Room service appropriate? Yes; Fluid consistency: Thin  Diet effective now                   Scheduled Meds:  allopurinol  300 mg Oral Daily   Chlorhexidine Gluconate Cloth  6 each Topical Daily   feeding supplement  237 mL Oral BID BM   midodrine  10 mg Oral TID WC   mirtazapine  15 mg Oral QHS   pantoprazole  40 mg Oral BID   sodium chloride flush  3 mL Intravenous Q12H   Tafamidis  1 capsule Oral Daily   tamsulosin  0.4 mg Oral QPC supper    PRN meds: acetaminophen **OR** acetaminophen, HYDROmorphone HCl,  LORazepam, polyethylene glycol   Infusions:   albumin human      Antimicrobials: Anti-infectives (From admission, onward)    None       Objective: Vitals:   07/30/23 0613 07/30/23 0825  BP: (!) 87/71 (!) 87/67  Pulse: 83 79  Resp: 18 17  Temp: (!) 97.5 F (36.4 C) 97.8 F (36.6 C)  SpO2: 100% 100%    Intake/Output Summary (Last 24 hours) at 07/30/2023 1029 Last data filed at 07/30/2023 0600 Gross per 24 hour  Intake 720 ml  Output 1050 ml  Net -330 ml   Filed Weights   07/29/23 0052  Weight: 77.1 kg   Weight change:  Body mass index is 21.82 kg/m.   Physical Exam: General exam: Pleasant, elderly African-American male Skin: No rashes, lesions or ulcers. HEENT: Atraumatic, normocephalic, no obvious bleeding Lungs: Clear to auscultation bilaterally CVS: Regular rate and rhythm, no murmur GI/Abd distended abdomen from ascites, nontender CNS: Alert, awake, oriented x 3 Psychiatry: Mood appropriate Extremities: 1+ bilateral pedal edema  Data Review: I have personally reviewed the laboratory data and studies available.  F/u labs ordered Unresulted Labs (From admission, onward)     Start     Ordered   07/30/23 0500  APTT  Tomorrow morning,   R        07/29/23 0758   07/30/23 0500  Protime-INR  Tomorrow morning,   R        07/29/23 0758   07/30/23 0500  Basic metabolic panel  Tomorrow morning,   R        07/29/23 0758   07/30/23 0500  CBC  Tomorrow morning,   R        07/29/23 0758            Total time spent in review of labs and imaging, patient evaluation, formulation of plan, documentation and communication with family: 45 minutes  Signed, Lorin Glass, MD [facility] 07/30/2023

## 2023-07-31 DIAGNOSIS — R0609 Other forms of dyspnea: Secondary | ICD-10-CM | POA: Diagnosis not present

## 2023-07-31 MED ORDER — GABAPENTIN 100 MG PO CAPS
200.0000 mg | ORAL_CAPSULE | Freq: Two times a day (BID) | ORAL | Status: DC
  Administered 2023-07-31 – 2023-08-01 (×3): 200 mg via ORAL
  Filled 2023-07-31 (×3): qty 2

## 2023-07-31 MED ORDER — OXYCODONE HCL 5 MG PO TABS
5.0000 mg | ORAL_TABLET | Freq: Four times a day (QID) | ORAL | Status: DC | PRN
  Administered 2023-07-31: 5 mg via ORAL
  Filled 2023-07-31: qty 1

## 2023-07-31 NOTE — Progress Notes (Signed)
PROGRESS NOTE  JANETTE DOUROS  DOB: 10/13/1956  PCP: Adrian Prince, MD ZOX:[id number]  DOA: 07/29/2023  LOS: 1 day  Hospital Day: 3  Brief narrative: ARMAD DUFOUR is a 67 y.o. male with PMH significant for end-stage liver cirrhosis, ischemic cardiomyopathy, cardiac amyloidosis, A-fib, DM2, CKD 3B, sleep apnea Patient has end-stage liver cirrhosis with ascites and was requiring recurrent paracentesis.  Recently hospitalized 9/24 to 9/30 for GI bleed, syncope, he was given blood transfusion, vitamin K, Kcentra.  Xarelto was held.  Patient was ultimately transition to comfort care and discharged home on home hospice.  At discharge, diuretics were held because she is blood pressure was running low.  10/5, patient was brought to ED with shortness of breath, and worsening generalized edema, shortness of breath.  It seems his symptoms could not be controlled adequately for him by hospice team and [place] sent to ED.  In the ED, vital signs stable Noted to have marked abdominal distention Blood pressure in 90s Labs with WBC of 6.8, hemoglobin 9.6,  Sodium 127, potassium 5.4, 07/2040/1 0.66  BNP over 1000 Admitted to [facility]  Subjective: Patient was seen and examined this morning. Alert, awake, able to have normal conversation. He was waiting for Pleurx catheter placement today which unfortunately could not happen. Complains of pain on both heels.  Assessment and plan: Volume retention Hypotension Presented with edema, dyspnea on exertion in the setting of end-stage liver cirrhosis.  Diuretics were held due to low blood pressure on discharge with home hospice last week  Given IV Lasix in the ED. Could repeat IV Lasix but blood pressure is low.  Not requiring supplemental oxygen   End-stage liver cirrhosis  ascites Pending Pleurx catheter placement by IR.  Could not happen today unfortunately.  Tentative plan for tomorrow.  Ischemic cardiomyopathy Cardiac amyloidosis Cardiac meds were  stopped in last hospital stay. Tafamidis was reordered on admission.   PAF Patient's anticoagulation was held in last hospitalization because patient has previously had intramuscular hematoma formation and also his ascitic tap was markedly bloody.   Benign prostatic hyperplasia  Foley catheter in situ Was discharged with Foley catheter on 9/30 Okay to maintain for comfort   CKD 2 Hepatorenal syndrome Creatinine seems to be at baseline which fluctuates around 2,  Recent Labs    07/15/23 0150 07/17/23 0243 07/18/23 1250 07/20/23 0609 07/21/23 0808 07/22/23 0608 07/23/23 1257 07/24/23 0454 07/29/23 0104 07/29/23 0815 07/30/23 1242  BUN 43* 33* 42* 49* 50* 49* 44* 44* 41*  --  39*  CREATININE 2.04* 1.89* 2.10* 2.42* 2.44* 2.43* 2.03* 2.02* 1.66* 1.59* 1.60*   Hyponatremia Hypervolemic eminently due to ascites. Recent Labs  Lab 07/29/23 0104 07/30/23 1242  NA 127* 126*   Hyperkalemia Potassium level was elevated to 5.4 on admission.  Lokelma was given Recent Labs  Lab 07/29/23 0104 07/30/23 1242  K 5.4* 4.6  MG 2.1  --    Bilateral heel pain Likely neuropathy.  Started on Neurontin, allopurinol as well as oxycodone as needed    Comfort meds ordered. Home med rec revewied.    Mobility: Able to ambulate at home  Goals of care   Code Status: Do not attempt resuscitation (DNR) - Comfort care     DVT prophylaxis: I would avoid Lovenox given history of bleeding and hospice status SCDs Start: 07/29/23 0757   Antimicrobials: None Fluid: None  Consultants: IR Family Communication: None at bedside  Status: Observation Level of care:  Med-Surg   Patient is  from: Home hospice Needs to continue in-hospital care: Pending IR evaluation for Pleurx catheter placement for recurrent ascites Anticipated d/c to: Home with hospice      Diet:  Diet Order             Diet NPO time specified Except for: Sips with Meds  Diet effective midnight           Diet  regular Fluid consistency: Thin  Diet effective now                   Scheduled Meds:  allopurinol  300 mg Oral Daily   Chlorhexidine Gluconate Cloth  6 each Topical Daily   feeding supplement  237 mL Oral BID BM   gabapentin  200 mg Oral BID   midodrine  10 mg Oral TID WC   mirtazapine  15 mg Oral QHS   pantoprazole  40 mg Oral BID   sodium chloride flush  3 mL Intravenous Q12H   Tafamidis  1 capsule Oral Daily   tamsulosin  0.4 mg Oral QPC supper    PRN meds: acetaminophen **OR** acetaminophen, HYDROmorphone HCl, LORazepam, polyethylene glycol   Infusions:     Antimicrobials: Anti-infectives (From admission, onward)    None       Objective: Vitals:   07/31/23 0350 07/31/23 0924  BP: (!) 87/68 (!) 89/70  Pulse: 85 84  Resp: 18 17  Temp: 98.2 F (36.8 C) 98.2 F (36.8 C)  SpO2: 100% 100%    Intake/Output Summary (Last 24 hours) at 07/31/2023 1439 Last data filed at 07/31/2023 0900 Gross per 24 hour  Intake 240 ml  Output 850 ml  Net -610 ml   Filed Weights   07/29/23 0052 07/31/23 0500  Weight: 77.1 kg 77.4 kg   Weight change:  Body mass index is 21.91 kg/m.   Physical Exam: General exam: Pleasant, elderly African-American male Skin: No rashes, lesions or ulcers. HEENT: Atraumatic, normocephalic, no obvious bleeding Lungs: Clear to auscultation bilaterally CVS: Regular rate and rhythm, no murmur GI/Abd distended abdomen from ascites, nontender CNS: Alert, awake, oriented x 3 Psychiatry: Mood appropriate Extremities: 1+ bilateral pedal edema.  Tenderness present in both heels on touch. No obvious wound  Data Review: I have personally reviewed the laboratory data and studies available.  F/u labs ordered Unresulted Labs (From admission, onward)    None       Total time spent in review of labs and imaging, patient evaluation, formulation of plan, documentation and communication with family: 45 minutes  Signed, Lorin Glass, MD [facility] 07/31/2023

## 2023-07-31 NOTE — Progress Notes (Signed)
Heart Failure Navigator Progress Note  Assessed for Heart & Vascular TOC clinic readiness.  Patient does not meet criteria due to Advanced Heart Failure Team patient of Dr. Bensimhon.   Navigator will sign off at this time.   Lucilia Yanni, BSN, RN Heart Failure Nurse Navigator Secure Chat Only   

## 2023-07-31 NOTE — TOC Initial Note (Signed)
Transition of Care (TOC) - Initial/Assessment Note   Spoke to patient at bedside. From home with wife. Active with [facility] for home hospice and wants to continue services.   Patient has a hospital bed and walker at home already.   Patient currently on oxygen, does not have home oxygen.   Shawn with [facility] aware of admission.   Await IR consult   Patient states wife will provide transportation home at discharge.   TOC Team will continue to follow.  Patient Details  Name: Frank Berg MRN: [id number] Date of Birth: [birth year]  Transition of Care [facility]) CM/SW Contact:    Kingsley Plan, RN Phone Number: 07/31/2023, 2:03 PM  Clinical Narrative:                   Expected Discharge Plan: Home w Hospice Care Barriers to Discharge: Continued Medical Work up   Patient Goals and CMS Choice Patient states their goals for this hospitalization and ongoing recovery are:: to return to home CMS Medicare.gov Compare Post Acute Care list provided to:: Patient Choice offered to / list presented to : Patient [employer] ownership interest in [place].provided to:: Patient    Expected Discharge Plan and Services   Discharge Planning Services: CM Consult Post Acute Care Choice: Hospice Living arrangements for the past 2 months: Single Family Home                 DME Arranged: N/A           HH Agency: [facility] Date [facility] Agency Contacted: 07/31/23 Time HH Agency Contacted: 1402 Representative spoke with at [facility] Agency: Shawn  Prior Living Arrangements/Services Living arrangements for the past 2 months: Single Family Home Lives with:: Spouse Patient language and need for interpreter reviewed:: Yes Do you feel safe going back to the place where you live?: Yes      Need for Family Participation in Patient Care: Yes (Comment) Care giver support system in place?: Yes (comment) Current home services: DME Criminal Activity/Legal  Involvement Pertinent to Current Situation/Hospitalization: No - Comment as needed  Activities of Daily Living   ADL Screening (condition at time of admission) Independently performs ADLs?: No Does the patient have a NEW difficulty with bathing/dressing/toileting/self-feeding that is expected to last >3 days?: No Does the patient have a NEW difficulty with getting in/out of bed, walking, or climbing stairs that is expected to last >3 days?: Yes (Initiates electronic notice to provider for possible PT consult) Does the patient have a NEW difficulty with communication that is expected to last >3 days?: No Is the patient deaf or have difficulty hearing?: No Does the patient have difficulty seeing, even when wearing glasses/contacts?: No Does the patient have difficulty concentrating, remembering, or making decisions?: No  Permission Sought/Granted   Permission granted to share information with : No              Emotional Assessment Appearance:: Appears stated age Attitude/Demeanor/Rapport: Engaged Affect (typically observed): Accepting Orientation: : Oriented to Self, Oriented to  Time, Oriented to Place, Oriented to Situation Alcohol / Substance Use: Not Applicable Psych Involvement: No (comment)  Admission diagnosis:  Shortness of breath [R06.02] Abdominal distention [R14.0] Ascites [R18.8] Patient Active Problem List   Diagnosis Date Noted   Abdominal distention 07/29/2023   Ascites 07/29/2023   Familial transthyretin amyloid cardiomyopathy (HCC) 07/22/2023   Acute renal failure superimposed on stage 3b chronic kidney disease (HCC) 07/21/2023   Palliative care  by specialist 07/21/2023   Goals of care, counseling/discussion 07/21/2023   Symptomatic anemia 07/19/2023   Intraperitoneal hemorrhage 07/19/2023   Hypotension 07/19/2023   Chronic combined systolic (congestive) and diastolic (congestive) heart failure (HCC) 07/18/2023   Acute blood loss anemia (ABLA) 07/18/2023    Cirrhosis of liver with ascites (HCC) 07/18/2023   GI bleed 07/14/2023   Chronic anticoagulation 07/14/2023   HFrEF (heart failure with reduced ejection fraction) (HCC) 07/14/2023   Cardiac amyloidosis (HCC) 07/14/2023   Heredofamilial amyloidosis (HCC)-TTR, 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   Acute respiratory failure with hypoxia (HCC)    Precordial chest pain    SOB (shortness of breath)    Acute CHF (congestive heart failure) (HCC) 12/17/2021   Acute CHF (HCC) 12/17/2021   Abnormal findings on diagnostic imaging of liver and biliary tract 05/25/2021   Benign prostatic hyperplasia with lower urinary tract symptoms 05/25/2021   Diastolic dysfunction 05/25/2021   Ischemic cardiomyopathy 05/25/2021   Long term (current) use of anticoagulants 05/25/2021   Unintentional weight loss 05/25/2021   Decompensated heart failure (HCC) 04/25/2021   Systolic CHF, chronic (HCC)    Hemoptysis 03/11/2021   Near syncope 02/12/2021   CKD (chronic kidney disease), 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   Chronic diastolic CHF (congestive heart failure) (HCC) 02/12/2021   Anticoagulated by anticoagulation treatment 01/05/2021   Bilateral impacted cerumen 01/05/2021   Hx of adenomatous polyp of colon 12/03/2020   Coronary artery disease due to lipid rich plaque 12/27/2018   Atrial fibrillation (HCC) 12/27/2018   Obesity 09/10/2018   Right-sided Bell's palsy 07/30/2018   Raynaud's disease 11/06/2017   Male erectile disorder 09/13/2017   CAD (coronary artery disease) 06/20/2017   Hyperlipidemia 06/20/2017   PAF (paroxysmal atrial fibrillation) (HCC) 06/20/2017   Gout 05/22/2017   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HCC) 05/22/2017   Acute systolic CHF (congestive heart failure) (HCC) 10/10/2016   Elevated troponin 10/09/2016   Benign essential hypertension 09/05/2016   Healthcare maintenance 09/05/2016   Need for immunization against influenza 09/05/2016   Pure hypercholesterolemia 09/05/2016   Typical  atrial flutter (HCC) 07/19/2016   S/P cryoablation of arrhythmia 06/02/2016   OSA on CPAP 06/02/2016   Type 2 diabetes mellitus (HCC) 06/02/2016   Bilateral edema of lower extremity 05/02/2016   Presence of prosthetic heart valve 05/02/2016   DOE (dyspnea on exertion) 05/02/2016   PCP:  Adrian Prince, MD Pharmacy:   [facility] - [place], [place] - [address] [place] [address] Phone: [phone] Fax: [phone]  [id number] [place], [place] - [address] [place] [address] Phone: [phone] Fax: [phone]     Social Determinants of Health (SDOH) Social History: SDOH Screenings   Food Insecurity: No Food Insecurity (07/29/2023)  Housing: Low Risk  (07/29/2023)  Transportation Needs: No Transportation Needs (07/29/2023)  Utilities: Not At Risk (07/29/2023)  Financial Resource Strain: Low Risk  (07/06/2022)   [facility]  Social Connections: Unknown (03/07/2022)   Received from [facility]  Tobacco Use: Medium Risk (07/29/2023)   SDOH Interventions:     Readmission Risk Interventions    07/31/2023    2:01 PM  Readmission Risk Prevention Plan  Transportation Screening Complete  PCP or Specialist Appt within 3-5 Days Complete  HRI or Home Care Consult Complete  Social Work Consult for  Recovery Care Planning/Counseling Complete  Palliative Care Screening Complete  Medication Review (RN Care Manager) Referral to Pharmacy

## 2023-07-31 NOTE — Progress Notes (Signed)
Frank Berg [id number]- [facility] hospitalized hospice patient visit   [name] is a current [facility] patient with a terminal diagnosis of cirrhosis of the liver. He was presented to the ED after 2 days of worsening shortness of breath. He was admitted 0n 10.5 due to Dyspnea and abdominal distention. [facility] was notified after patient went to the ED. Per Dr. Kirt Boys with [facility] this is a related admission.   Visited patient in hospital and exchanged report with bedside nurse, who reports patient is to have "paracentesis" pleurx catheter placed today. Patient reports since swelling has gotten better in his feet they feel sensitive. Noted MD has ordered Neurontin. Patient denies complaint and is hopeful to get out of the hospital soon. Patient remains GIP appropriate due to need for abdominal fluid drainage procedure, IV pain medication, and close monitoring of excess fluid management with low blood pressure.  Vital Signs: 98.2/84/17    89/70      100% on 2L I&O:   480/850 Abnormal labs: none new Diagnostics: none new IV/PRN Meds: Albumin 12.5 GM x 1, Dilaudid 1mg  po x 1 Problem List:  Volume retention Hypotension Presented with edema, dyspnea on exertion in the setting of end-stage liver cirrhosis.  Diuretics were held due to low blood pressure on discharge with home hospice last week  Given IV Lasix in the ED. Could repeat IV Lasix but blood pressure is low.  Not requiring supplemental oxygen   End-stage liver cirrhosis  ascites Pending Pleurx catheter placement by IR.  Could not happen today unfortunately.  Tentative plan for tomorrow.   Ischemic cardiomyopathy Cardiac amyloidosis Cardiac meds were stopped in last hospital stay. Tafamidis was reordered on admission.   PAF Patient's anticoagulation was held in last hospitalization because patient has previously had intramuscular hematoma formation and also his ascitic tap was markedly bloody.     Hepatorenal syndrome Creatinine seems to be at baseline which fluctuates around 2,  Recent Labs (within last 365 days)    Hyponatremia Hypervolemic eminently due to ascites. Last Labs            Hyperkalemia Potassium level was elevated to 5.4 on admission.  Lokelma was given Last Labs            Bilateral heel pain Likely neuropathy.  Started on Neurontin, allopurinol as well as oxycodone as needed  Discharge Planning: Ongoing Family Contact: Patient reports wife is updated to his care. Contact info left with patient in case wife has questions or concerns IDT: Updated Goals of Care: DNR  Should patient need ambulance transfer at discharge- please use [facility]) as they contract this service for our active hospice patients. Glenna Fellows BSN, RN, [facility] hospital liaison [phone]

## 2023-07-31 NOTE — Plan of Care (Signed)
  Problem: Education: Goal: Knowledge of General Education information will improve Description: Including pain rating scale, medication(s)/side effects and non-pharmacologic comfort measures Outcome: Progressing   Problem: Clinical Measurements: Goal: Will remain free from infection Outcome: Progressing   Problem: Nutrition: Goal: Adequate nutrition will be maintained Outcome: Progressing   Problem: Pain Managment: Goal: General experience of comfort will improve Outcome: Progressing   Problem: Skin Integrity: Goal: Risk for impaired skin integrity will decrease Outcome: Progressing   Problem: Education: Goal: Knowledge of disease and its progression will improve Outcome: Progressing   Problem: Fluid Volume: Goal: Compliance with measures to maintain balanced fluid volume will improve Outcome: Progressing

## 2023-08-01 ENCOUNTER — Inpatient Hospital Stay (HOSPITAL_COMMUNITY)

## 2023-08-01 DIAGNOSIS — R0609 Other forms of dyspnea: Secondary | ICD-10-CM | POA: Diagnosis not present

## 2023-08-01 HISTORY — PX: IR PERC TUN PERIT CATH WO PORT S&I /IMAG: IMG2327

## 2023-08-01 MED ORDER — VANCOMYCIN HCL IN DEXTROSE 1-5 GM/200ML-% IV SOLN
INTRAVENOUS | Status: AC
  Filled 2023-08-01: qty 200

## 2023-08-01 MED ORDER — GABAPENTIN 100 MG PO CAPS: 200.0000 mg | ORAL_CAPSULE | Freq: Two times a day (BID) | ORAL | 0 refills | Status: AC

## 2023-08-01 MED ORDER — LIDOCAINE-EPINEPHRINE 1 %-1:100000 IJ SOLN
INTRAMUSCULAR | Status: AC
  Filled 2023-08-01: qty 1

## 2023-08-01 MED ORDER — MIDAZOLAM HCL 2 MG/2ML IJ SOLN
INTRAMUSCULAR | Status: AC
  Filled 2023-08-01: qty 2

## 2023-08-01 MED ORDER — FENTANYL CITRATE (PF) 100 MCG/2ML IJ SOLN
INTRAMUSCULAR | Status: AC | PRN
  Administered 2023-08-01: 25 ug via INTRAVENOUS

## 2023-08-01 MED ORDER — HYDROMORPHONE HCL 2 MG PO TABS: 1.0000 mg | ORAL_TABLET | Freq: Four times a day (QID) | ORAL | 0 refills | Status: AC | PRN

## 2023-08-01 MED ORDER — MIDAZOLAM HCL 2 MG/2ML IJ SOLN
INTRAMUSCULAR | Status: AC | PRN
Start: 2023-08-01 — End: 2023-08-01
  Administered 2023-08-01: .5 mg via INTRAVENOUS

## 2023-08-01 MED ORDER — FENTANYL CITRATE (PF) 100 MCG/2ML IJ SOLN
INTRAMUSCULAR | Status: AC
  Filled 2023-08-01: qty 2

## 2023-08-01 MED ORDER — VANCOMYCIN HCL IN DEXTROSE 1-5 GM/200ML-% IV SOLN: 1000.0000 mg | Freq: Once | INTRAVENOUS | Status: DC

## 2023-08-01 MED ORDER — VANCOMYCIN HCL IN DEXTROSE 1-5 GM/200ML-% IV SOLN
INTRAVENOUS | Status: AC | PRN
Start: 2023-08-01 — End: 2023-08-01
  Administered 2023-08-01: 1000 mg via INTRAVENOUS

## 2023-08-01 NOTE — Discharge Summary (Signed)
Physician Discharge Summary  Frank Berg:[id number] DOB: [birth year] DOA: 07/29/2023  PCP: Adrian Prince, MD  Admit date: 07/29/2023 Discharge date: 08/01/2023  Admitted From: Home with hospice Discharge disposition: Home with hospice  Recommendations at discharge:  Peritoneal drainage by PleurX catheter per hospice  Brief narrative: Frank Berg is a 67 y.o. male with PMH significant for end-stage liver cirrhosis, ischemic cardiomyopathy, cardiac amyloidosis, A-fib, DM2, CKD 3B, sleep apnea Patient has end-stage liver cirrhosis with ascites and was requiring recurrent paracentesis.  Recently hospitalized 9/24 to 9/30 for GI bleed, syncope, he was given blood transfusion, vitamin K, Kcentra.  Xarelto was held.  Patient was ultimately transition to comfort care and discharged home on home hospice.  At discharge, diuretics were held because she is blood pressure was running low.  10/5, patient was brought to ED with shortness of breath, and worsening generalized edema, shortness of breath.  It seems his symptoms could not be controlled adequately for him by hospice team and Rosamond sent to ED.  In the ED, vital signs stable Noted to have marked abdominal distention Blood pressure in 90s Labs with WBC of 6.8, hemoglobin 9.6,  Sodium 127, potassium 5.4, 07/2040/1 0.66  BNP over 1000 Admitted to [facility]  Subjective: Patient was seen and examined this morning  Underwent Pleurx catheter placement this afternoon. Feels ready to go home.  Assessment and plan: Volume retention Hypotension Presented with edema, dyspnea on exertion in the setting of end-stage liver cirrhosis.  Diuretics were held due to low blood pressure on discharge with home hospice last week  Given IV Lasix in the ED. Not requiring supplemental oxygen   End-stage liver cirrhosis  ascites Underwent Pleurx catheter placement this afternoon.  Management per hospice  Ischemic cardiomyopathy Cardiac  amyloidosis Cardiac meds were stopped in last hospital stay. Tafamidis was reordered   PAF Patient's anticoagulation was held in last hospitalization because patient has previously had intramuscular hematoma formation and also his ascitic tap was markedly bloody.   Benign prostatic hyperplasia  Foley catheter in situ Was discharged with Foley catheter on 9/30 Okay to maintain for comfort   CKD 2 Hepatorenal syndrome Creatinine seems to be at baseline which fluctuates around 2,  Recent Labs    07/15/23 0150 07/17/23 0243 07/18/23 1250 07/20/23 0609 07/21/23 0808 07/22/23 0608 07/23/23 1257 07/24/23 0454 07/29/23 0104 07/29/23 0815 07/30/23 1242  BUN 43* 33* 42* 49* 50* 49* 44* 44* 41*  --  39*  CREATININE 2.04* 1.89* 2.10* 2.42* 2.44* 2.43* 2.03* 2.02* 1.66* 1.59* 1.60*   Hyponatremia Hypervolemic evidently due to ascites. Recent Labs  Lab 07/29/23 0104 07/30/23 1242  NA 127* 126*   Hyperkalemia Potassium level was elevated to 5.4 on admission.  Lokelma was given Recent Labs  Lab 07/29/23 0104 07/30/23 1242  K 5.4* 4.6  MG 2.1  --    Bilateral heel pain Likely neuropathy.  Started on Neurontin, allopurinol as well as oxycodone as needed    Comfort meds ordered. Home med rec revewied.    Mobility: Able to ambulate at home  Goals of care   Code Status: Do not attempt resuscitation (DNR) - Comfort care   Wounds:  - Wound / Incision (Open or Dehisced) 08/01/23 Puncture Abdomen Right;Upper abdominal pleurex puncture site (Active)  Date First Assessed/Time First Assessed: 08/01/23 1155   Wound Type: Puncture  Location: Abdomen  Location Orientation: Right;Upper  Wound Description (Comments): abdominal pleurex puncture site  Present on Admission: No    Assessments 08/01/2023  11:56 AM  Dressing Type Gauze (Comment);Transparent dressing  Dressing Changed New  Dressing Status Clean, Dry, Intact  Dressing Change Frequency PRN  Site / Wound Assessment  Clean;Dry;Pink  Peri-wound Assessment Intact  Closure Sutures  Drainage Amount None  Treatment Cleansed     No associated orders.    Discharge Exam:   Vitals:   08/01/23 1127 08/01/23 1140 08/01/23 1145 08/01/23 1200  BP: 98/74 (!) 87/67 100/74 94/64  Pulse: 80 80 92 88  Resp: 15 19 10 14   Temp:      TempSrc:      SpO2: 100% 100% 100% 98%  Weight:      Height:        Body mass index is 21.88 kg/m.  General exam: Pleasant, elderly African-American male Skin: No rashes, lesions or ulcers. HEENT: Atraumatic, normocephalic, no obvious bleeding Lungs: Clear to auscultation bilaterally CVS: Regular rate and rhythm, no murmur GI/Abd distended abdomen from ascites- improved after pleurX catheter placed, nontender.  CNS: Alert, awake, oriented x 3 Psychiatry: Mood appropriate Extremities: 1+ bilateral pedal edema.  Tenderness present in both heels on touch. No obvious wound  Follow ups:    Follow-up Information     Adrian Prince, MD Follow up.   Specialty: Endocrinology Contact information: [address] [place] [address] [phone]                 Discharge Instructions:   Discharge Instructions     Activity as tolerated - No restrictions   Complete by: As directed    Call MD for:   Complete by: As directed    Please get in touch with hospice MD/nurse for any symptom control.   Diet general   Complete by: As directed    If patient is alert and awake enough to eat, can allow luxury feeding.   Discharge instructions   Complete by: As directed    Medicines intended for comfort and pain management prescribed. Rest of the care per hospice policy.   No dressing needed   Complete by: As directed        Discharge Medications:   Allergies as of 08/01/2023       Reactions   Lipitor [atorvastatin] Other (See Comments)   Body aches   Penicillins Swelling   **Tolerates cephalosporins Swollen face   Penicillin G    Other reaction(s): Unknown         Medication List     STOP taking these medications    ciprofloxacin 500 MG tablet Commonly known as: CIPRO       TAKE these medications    allopurinol 300 MG tablet Commonly known as: ZYLOPRIM Take 300 mg by mouth daily.   Amvuttra 25 MG/0.5ML syringe Generic drug: vutrisiran sodium Inject 0.5 mLs (25 mg total) into the skin every 3 (three) months.   CertaVite/Antioxidants Tabs Take 1 tablet by mouth daily.   Ensure Max Protein Liqd Take 330 mLs (11 oz total) by mouth at bedtime.   ferrous sulfate 325 (65 FE) MG tablet Take 1 tablet (325 mg total) by mouth daily with breakfast.   gabapentin 100 MG capsule Commonly known as: NEURONTIN Take 2 capsules (200 mg total) by mouth 2 (two) times daily.   HYDROmorphone 2 MG tablet Commonly known as: Dilaudid Take 0.5 tablets (1 mg total) by mouth every 6 (six) hours as needed for up to 5 days for severe pain.   loratadine 10 MG tablet Commonly known as: CLARITIN Take 10 mg by mouth  daily as needed for allergies.   LORazepam 1 MG tablet Commonly known as: Ativan Take 1 tablet (1 mg total) by mouth every 8 (eight) hours as needed for anxiety.   midodrine 10 MG tablet Commonly known as: PROAMATINE Take 1 tablet (10 mg total) by mouth 3 (three) times daily.   mirtazapine 15 MG tablet Commonly known as: REMERON Take 15 mg by mouth at bedtime.   OneTouch Verio test strip Generic drug: glucose blood TEST ONCE A DAY DX E11.29   pantoprazole 40 MG tablet Commonly known as: PROTONIX Take 1 tablet (40 mg total) by mouth 2 (two) times daily.   tamsulosin 0.4 MG Caps capsule Commonly known as: FLOMAX Take 0.4 mg by mouth at bedtime.   Vitamin A 2400 MCG (8000 UT) Caps Take 1 capsule by mouth daily.   Vyndamax 61 MG Caps Generic drug: Tafamidis Take 1 capsule by mouth daily.               Discharge Care Instructions  (From admission, onward)           Start     Ordered   08/01/23 0000  No  dressing needed        08/01/23 1335             The results of significant diagnostics from this hospitalization (including imaging, microbiology, ancillary and laboratory) are listed below for reference.    Procedures and Diagnostic Studies:   DG Chest 2 View  Result Date: 07/29/2023 CLINICAL DATA:  Shortness of breath EXAM: CHEST - 2 VIEW COMPARISON:  Chest radiograph 12/20/2021 FINDINGS: Stable cardiomediastinal silhouette. Aortic atherosclerotic calcification. Sternotomy and AVR. No focal consolidation, pleural effusion, or pneumothorax. Chronic scarring of the right mid and lower lung with blunting of the costophrenic angle. No displaced rib fractures. IMPRESSION: No active cardiopulmonary disease. Electronically Signed   By: Minerva Fester M.D.   On: 07/29/2023 02:22     Labs:   Basic Metabolic Panel: Recent Labs  Lab 07/29/23 0104 07/29/23 0815 07/30/23 1242  NA 127*  --  126*  K 5.4*  --  4.6  CL 100  --  99  CO2 18*  --  17*  GLUCOSE 135*  --  117*  BUN 41*  --  39*  CREATININE 1.66* 1.59* 1.60*  CALCIUM 8.2*  --  8.2*  MG 2.1  --   --    GFR Estimated Creatinine Clearance: 47.6 mL/min (A) (by C-G formula based on SCr of 1.6 mg/dL (H)). Liver Function Tests: Recent Labs  Lab 07/29/23 0104  AST 43*  ALT 10  ALKPHOS 115  BILITOT 1.1  PROT 7.1  ALBUMIN 2.6*   No results for input(s): "LIPASE", "AMYLASE" in the last 168 hours. Recent Labs  Lab 07/29/23 0247  AMMONIA 45*   Coagulation profile Recent Labs  Lab 07/29/23 0104 07/30/23 1242  INR 1.4* 1.4*    CBC: Recent Labs  Lab 07/29/23 0104 07/29/23 0815 07/30/23 1242  WBC 6.8 6.3 6.5  HGB 9.6* 9.8* 10.0*  HCT 29.9* 29.3* 30.2*  MCV 85.7 86.7 84.4  PLT 336 339 359   Cardiac Enzymes: No results for input(s): "CKTOTAL", "CKMB", "CKMBINDEX", "TROPONINI" in the last 168 hours. BNP: Invalid input(s): "POCBNP" CBG: No results for input(s): "GLUCAP" in the last 168 hours. D-Dimer No  results for input(s): "DDIMER" in the last 72 hours. Hgb A1c No results for input(s): "HGBA1C" in the last 72 hours. Lipid Profile No results for input(s): "CHOL", "HDL", "  LDLCALC", "TRIG", "CHOLHDL", "LDLDIRECT" in the last 72 hours. Thyroid function studies No results for input(s): "TSH", "T4TOTAL", "T3FREE", "THYROIDAB" in the last 72 hours.  Invalid input(s): "FREET3" Anemia work up No results for input(s): "VITAMINB12", "FOLATE", "FERRITIN", "TIBC", "IRON", "RETICCTPCT" in the last 72 hours. Microbiology Recent Results (from the past 240 hour(s))  Body fluid culture w Gram Stain     Status: None   Collection Time: 07/23/23 11:31 AM   Specimen: Abdomen; Peritoneal Fluid  Result Value Ref Range Status   Specimen Description PERITONEAL  Final   Special Requests NONE  Final   Gram Stain   Final    FEW WBC PRESENT,BOTH PMN AND MONONUCLEAR NO ORGANISMS SEEN    Culture   Final    NO GROWTH 3 DAYS Performed at [facility], [address] [place], [place] [address]    Report Status 07/26/2023 FINAL  Final    Time coordinating discharge: 25 minutes  Signed: Anjanette Gilkey  [facility] 08/01/2023, 1:36 PM

## 2023-08-01 NOTE — Procedures (Signed)
Interventional Radiology Procedure Note  Procedure: Abdominal PleurX  Indication: End stage liver disease with recurrent ascites.  Admitted to Hospice.  Findings: Please refer to procedural dictation for full description.  Complications: None  EBL: < 10 mL  Acquanetta Belling, MD [phone]

## 2023-08-01 NOTE — Telephone Encounter (Signed)
Pt called checking appt for bipap titration. Informed pt someone will call to set up appt once authorization from the insurance company has been received. Pt verbalized understand.

## 2023-08-01 NOTE — Progress Notes (Signed)
Post-IR procedure consult to review Anticoagulant/Antiplatelets  Patient would qualify for DVT prophylaxis but given patient is on hospice, not on any anticoagulation/antiplatelet agents at home and had recent hospitalization for GIB, will hold for now.    Alphia Moh, PharmD, BCPS, BCCP Clinical Pharmacist  Please check AMION for all [facility] phone numbers After 10:00 PM, call Main Pharmacy [phone]

## 2023-08-01 NOTE — Progress Notes (Signed)
   08/01/23 0008  BiPAP/CPAP/SIPAP  $ Non-Invasive Ventilator  Non-Invasive Vent Subsequent  BiPAP/CPAP/SIPAP Pt Type Adult  BiPAP/CPAP/SIPAP Resmed  Mask Type Full face mask  Mask Size Large  Flow Rate 3 lpm  Patient Home Equipment Yes   Pt resting comfortably on his home unit

## 2023-08-01 NOTE — TOC Progression Note (Addendum)
Transition of Care (TOC) - Progression Note   Patient has had Abdominal PleurX placed today . Secure chatted attending to sign order form for home drainage kits and frequency of draining.   Asked bedside nurse and charge nurse to order box of 10 drainage supply kits for patient to take home with him until he receives his shipment.   Shawn with [facility] aware , [facility] will drain daily and teach wife to do so .   Completed form for pleurx catheters faxed and mailed. Bedside nurse to provide a box of 10 at discharge for patient to take home   Patient has box of pleurx supplies to take home with him. Shawn with [facility] order home oxygen . Patient states wife received oxygen but is unsure if she received portable tank. Wife planning on providing patient home. Also patient asking for "head gear for CPAP" messaged Shawn   Above explained to wife via phone. She will be here in 30 minutes to transport patient home. She will bring portable oxygen tank with her. She knows to take box of supplies with her.  Patient Details  Name: WM SAHAGUN MRN: [id number] Date of Birth: 03/05/1956  Transition of Care [facility]) CM/SW Contact  Jennyfer Nickolson, Adria Devon, RN Phone Number: 08/01/2023, 12:29 PM  Clinical Narrative:       Expected Discharge Plan: Home w Hospice Care Barriers to Discharge: Continued Medical Work up  Expected Discharge Plan and Services   Discharge Planning Services: CM Consult Post Acute Care Choice: Hospice Living arrangements for the past 2 months: Single Family Home                 DME Arranged: N/A           HH Agency: [facility] Date [facility] Agency Contacted: 07/31/23 Time HH Agency Contacted: 1402 Representative spoke with at [facility] Agency: Shawn   Social Determinants of Health (SDOH) Interventions SDOH Screenings   Food Insecurity: No Food Insecurity (07/29/2023)  Housing: Low Risk  (07/29/2023)  Transportation Needs: No  Transportation Needs (07/29/2023)  Utilities: Not At Risk (07/29/2023)  Financial Resource Strain: Low Risk  (07/06/2022)   [facility]  Social Connections: Unknown (03/07/2022)   [facility], [facility]  Tobacco Use: Medium Risk (07/29/2023)    Readmission Risk Interventions    07/31/2023    2:01 PM  Readmission Risk Prevention Plan  Transportation Screening Complete  PCP or Specialist Appt within 3-5 Days Complete  HRI or Home Care Consult Complete  Social Work Consult for Recovery Care Planning/Counseling Complete  Palliative Care Screening Complete  Medication Review Oceanographer) Referral to Pharmacy

## 2023-08-01 NOTE — Plan of Care (Signed)
  Problem: Pain Managment: Goal: General experience of comfort will improve Outcome: Progressing   Problem: Safety: Goal: Ability to remain free from injury will improve Outcome: Progressing   

## 2023-08-03 ENCOUNTER — Ambulatory Visit: Payer: Medicare HMO | Admitting: Student

## 2023-08-21 NOTE — Telephone Encounter (Signed)
Bipap- [employer] medicare Berkley Harvey: [id number] (exp. 102/24 to 01/22/24)   Spoke with patient he is scheduled at [facility] for 09/03/23 at 8 pm.  Mailed new packet to the patient.

## 2023-09-03 ENCOUNTER — Ambulatory Visit (INDEPENDENT_AMBULATORY_CARE_PROVIDER_SITE_OTHER): Admitting: Neurology

## 2023-09-03 DIAGNOSIS — R188 Other ascites: Secondary | ICD-10-CM

## 2023-09-03 DIAGNOSIS — G4733 Obstructive sleep apnea (adult) (pediatric): Secondary | ICD-10-CM | POA: Diagnosis not present

## 2023-09-03 DIAGNOSIS — I5042 Chronic combined systolic (congestive) and diastolic (congestive) heart failure: Secondary | ICD-10-CM

## 2023-09-03 DIAGNOSIS — I5021 Acute systolic (congestive) heart failure: Secondary | ICD-10-CM

## 2023-09-03 DIAGNOSIS — I43 Cardiomyopathy in diseases classified elsewhere: Secondary | ICD-10-CM

## 2023-09-03 DIAGNOSIS — I4891 Unspecified atrial fibrillation: Secondary | ICD-10-CM

## 2023-09-03 DIAGNOSIS — G4737 Central sleep apnea in conditions classified elsewhere: Secondary | ICD-10-CM | POA: Diagnosis not present

## 2023-09-03 DIAGNOSIS — E854 Organ-limited amyloidosis: Secondary | ICD-10-CM

## 2023-09-07 ENCOUNTER — Other Ambulatory Visit (HOSPITAL_BASED_OUTPATIENT_CLINIC_OR_DEPARTMENT_OTHER): Payer: Self-pay

## 2023-09-07 ENCOUNTER — Other Ambulatory Visit (HOSPITAL_COMMUNITY): Payer: Self-pay

## 2023-09-08 ENCOUNTER — Other Ambulatory Visit: Payer: Self-pay | Admitting: Neurology

## 2023-09-08 DIAGNOSIS — R188 Other ascites: Secondary | ICD-10-CM | POA: Insufficient documentation

## 2023-09-08 MED ORDER — LORAZEPAM 1 MG PO TABS: 1.0000 mg | ORAL_TABLET | Freq: Every evening | ORAL | 1 refills | Status: DC | PRN

## 2023-09-08 MED ORDER — MIRTAZAPINE 15 MG PO TABS: 15.0000 mg | ORAL_TABLET | Freq: Every day | ORAL | 1 refills | Status: DC

## 2023-09-08 NOTE — Procedures (Signed)
[facility] PAP TITRATION INTERPRETATION REPORT   STUDY DATE: 09/03/2023      PATIENT NAME:  Frank Berg         DATE OF BIRTH:  Mar 18, 1956  PATIENT ID:  [id number]    TYPE OF STUDY:  BiPAP  READING PHYSICIAN: Melvyn Novas, MD Ordering provider; Butch Penny, NP PCP : Dr Evlyn Kanner  SCORING TECHNICIAN: Domingo Cocking, RPSGT   HISTORY: Frank Berg is 67 year-old Male patient on CPAP who feels that his PAP machine is not longer providing enough pressure. This patient was seen by Lamont Dowdy, NP, who ordered his last study as a SPLIT on 06-12-2023. There was not enough sleep time to split this study- CENTRAL Sleep disordered breathing was present. The patient was unable to sleep (despite Ambien?)  Sleep onset was much too late, so a titration was not attempted.  The patient will likely need at least BiPAP ST, and most likely ASV with this proportion of central apnea.  I ordered a return study (in-lab) for this special titration. The patient will need to bring his sleep aid with him as should all sleep patients.     Total sleep time was reduced at 89.5 minutes.  Sleep efficiency was decreased at 22.3%  New machine needed by date of visit.  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 and returns for an in lab BiPAP -ASV titration. The patient's sleep is dependent on Ambien . The Epworth Sleepiness Scale was endorsed at 4 out of 24 (scores above or equal to 10 are suggestive of hypersomnolence).  ADDITIONAL INFORMATION:  Height: 74.0 in Weight: 167 lb (BMI 21) Neck Size: 0.0 in    MEDICATIONS: Zyloprim, colchicine, Jardiance, Ensure Max Protein, iron, Claritin, Proamatine, Remeron, Multivitamins w/minerals, Xarelto, Crestor, Adcirca, Vyndamax, Demadex, Ambien   SLEEP CONTINUITY AND SLEEP ARCHITECTURE: Currently on CPAP with increasing  central apnea, large FFM and a setting of 15cm water. Started this titration at BIPAP of 10/5 cm water and increased to BIPAP of 14/9 cm water without resolution of AHI,  Switched to ASV and almost immediately controlled the central Apnea at PS max15/ PS min 6/with 3 cm water EPAP, with a residual AHI of 1.8/h. further increase to 15/6/5 cm water final pressure resulted in an AHI of 2.4/h.  The used interface was a Simplus FFM in large size.    Lights off was at 21:40: and lights on 05:03: (7.4 hours in bed). Total sleep time was 250.0 minutes (68.8% supine;  31.2% lateral;  0.0% prone, 2.2% REM sleep), with a still decreased sleep efficiency at 56.4%.   Sleep latency was brief at 7.5 minutes.  Of the total sleep time, the percentage of stage N1 sleep was 29.0%, stage N2 sleep was 68.8%, stage N3 sleep was 0.0%, and REM sleep was 2.2%.  There was 1 Stage R periods observed on this study night, 59 awakenings (i.e. transitions to Stage W from any sleep stage), and 164.0 total stage transitions.   Wake after sleep onset (WASO) time accounted for 184 minutes. The proportion of WASO time increased under ASV therapy while AHI decreased.   BODY POSITION: Duration of total sleep and percent of total sleep in their respective position is as follows: supine 172 minutes (68.8%), non-supine 78.0 minutes (31.2%); right 78 minutes (31.2%), left 00 minutes (0.0%), and prone 00 minutes (0.0%). Total supine REM sleep time was  05 minutes (100.0% of total REM sleep).  AROUSAL: There were 204 arousals in total, for an arousal index of 40.6 arousals/hour.  Of these, 71 were identified as respiratory-related arousals (17.0 /h), 0 were PLM-related arousals (0.0 /h), and 133 were non-specific arousals (31.9 /h)  RESPIRATORY MONITORING:  Based on CMS criteria (using a 4% oxygen desaturation rule for scoring hypopneas), there were 77 apneas (8 obstructive; 66 central; 3 mixed), and 0 hypopneas.  Apnea index was 18.5. Hypopnea  index was 0.0. The apnea-hypopnea index was 18.5 overall (23.4 supine, 0.0 non-supine; 10.9 REM, 10.9 supine REM). There were 0 respiratory effort-related arousals (RERAs).  The RERA index was 0.0 events/h. Total respiratory disturbance index (RDI) was 18.5 events/h. RDI results showed: supine RDI  23.4 /h; non-supine RDI 7.7 /h; REM RDI 10.9 /h, supine REM RDI 10.9 /h.   The EKG showed irregular heart rate and R to R interval .  The average heart rate during sleep was 74 bpm.  The maximum heart rate during sleep was 96 bpm. The maximum heart rate during recording was 112.  OXIMETRY: Respiratory events were associated with oxyhemoglobin desaturations (nadir during sleep 85%) from a mean of 98%. There were 0 occurrences of Cheyne [name] breathing. LIMB MOVEMENTS: There were 0 periodic limb movements of sleep (0.0/h), of which 0 (0.0/h) were associated with an arousal.   IMPRESSION:  The progressive underlying medical condition has rendered CPAP therapy ineffective and the same has proven true for BiPAP therapy.   The complex apnea responded well to ASV ( ADAPT SERVO VENTILATION) at PS max 15/ PS min 6 /with 3 cm water EPAP, with a residual AHI of 1.8/h  The used interface was a Simplus FFM in large size.      RECOMMENDATIONS: Apnea was well controlled at ASV settings of : PS max15/ PS min 6/with 3 cm water EPAP, with a residual AHI of 1.8/h.  (A further increase to 15/6/5 cm water final pressure resulted in an AHI of 2.4/h. ) The used interface was a Simplus FFM in large size.  The patient will benefit from using a sleep aid to get enough rest. I recommend to continue his sleep aid prescription.   RV after 30-60 days on therapy with ASV.   Melvyn Novas, MD Certified in Neurology by ABPN Certified in Sleep Medicine by [facility] [address] [place], [place] [address]            [facility] BiPAP Titration  Study     General Information  Name: Frank, Berg BMI: 47.42 Physician: Melvyn Novas, MD  ID: [id number] Height: 74.0 in Technician: Domingo Cocking, RPSGT  Sex: Male Weight: 167.0 lb Record: [id number]  Age: 37 [[birth year]] Date: 09/03/2023       Medical & Medication History    Mr. EMANUEL STROBLE is 67 year-old Male patient on CPAP who feels that his PAP machine is not longer providing enough pressure. This patient was seen by Lamont Dowdy, NP, who ordered his last  study as a SPLIT on 06-12-2023. There was not enough sleep time to split this study- New machine needed by date of issue.  Now ordered a new PAP / BiPAP titration to find an optimal pressure.   This patient's medication list includes Ambien. This patient carries a dx of amyloid neuropathy, followed by specialist clinic at [facility], Neuromuscular division. 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 Procedure explained and all questions answered. Patient is in urgent need of new equipment. Patient previous study revealed increased central respiratory events. Patient is currently using an CPAP at 15cm/H2O, EPR 2, with a large full face mask. Standard paste setup without complications. BiPAP was started at 10/5 cm with heated humidity, using a large Simplus full face mask. Patient slept right and supine. Mild snoring was heard. Respiratory events observed. BiPAP was increased to 14/9 cm BiPAP ST with BUR = 12, in an effort to control central respiratory events and abolish snoring. Change to ASV at 5/15/3  (EPAP/MaxPS/MinPS) due to continued central respiratory events. Most recent Echo showed EF = 45%. ASV increased to 6/15/5 in an effort to abolish snoring and control respiratory events. Cardiac arrhythmias observed. Patient has a known cardiac issue, including CHF and atrial fibrillation. No significant PLMS observed. Patient is wearing a [place] catheter.    CPAP start time: 09:40:24 PM CPAP end time: 05:03:07 AM   Time Total Supine Side Prone Upright  Recording (TRT) 7h 23.43m 5h 25.30m 1h 58.74m 0h 0.80m 0h 0.71m  Sleep (TST) 4h 10.49m 2h 52.62m 1h 18.26m 0h 0.58m 0h 0.73m   Latency N1 N2 N3 REM Onset Per. Slp. Eff.  Actual 0h 7.57m 0h 14.62m 0h 0.5m 6h 11.13m 0h 7.27m 1h 28.71m 56.43%   Stg Dur Wake N1 N2 N3 REM  Total 183.5 72.5 172.0 0.0 5.5  Supine 143.5 54.5 112.0 0.0 5.5  Side 40.0 18.0 60.0 0.0 0.0  Prone 0.0 0.0 0.0 0.0 0.0  Upright 0.0 0.0 0.0 0.0 0.0   Stg % Wake N1 N2 N3 REM  Total 42.3 29.0 68.8 0.0 2.2  Supine 33.1 21.8 44.8 0.0 2.2  Side 9.2 7.2 24.0 0.0 0.0  Prone 0.0 0.0 0.0 0.0 0.0  Upright 0.0 0.0 0.0 0.0 0.0     Apnea Summary Sub Supine Side Prone Upright  Total 77 Total 77 67 10 0 0    REM 1 1 0 0 0    NREM 76 66 10 0 0  Obs 8 REM 1 1 0 0 0    NREM 7 6 1  0 0  Mix 3 REM 0 0 0 0 0    NREM 3 3 0 0 0  Cen 66 REM 0 0 0 0 0    NREM 66 57 9 0 0   Rera Summary Sub Supine Side Prone Upright  Total 0 Total 0 0 0 0 0    REM 0 0 0 0 0    NREM 0 0 0 0 0   Hypopnea Summary Sub Supine Side Prone Upright  Total 4 Total 4 4 0 0 0    REM 0 0 0 0 0    NREM 4 4 0 0 0   4% Hypopnea Summary Sub Supine Side Prone Upright  Total (4%) 0 Total 0 0 0 0 0    REM 0 0 0 0 0    NREM 0 0 0 0 0     AHI Total Obs Mix Cen  19.44 Apnea 18.48 1.92 0.72 15.84   Hypopnea 0.96 -- -- --  18.48 Hypopnea (4%) 0.00 -- -- --    Total  Supine Side Prone Upright  Position AHI 19.44 24.77 7.69 0.00 0.00  REM AHI 10.91   NREM AHI 19.63   Position RDI 19.44 24.77 7.69 0.00 0.00  REM RDI 10.91   NREM  RDI 19.63    4% Hypopnea Total Supine Side Prone Upright  Position AHI (4%) 18.48 23.37 7.69 0.00 0.00  REM AHI (4%) 10.91   NREM AHI (4%) 18.65   Position RDI (4%) 18.48 23.37 7.69 0.00 0.00  REM RDI (4%) 10.91   NREM RDI (4%) 18.65    Desaturation Information  <100% <90% <80% <70% <60% <50% <40%  Supine 49 3 0 0 0 0 0  Side 8 2 0 0 0 0 0  Prone 0 0 0 0 0 0 0  Upright 0 0 0 0 0 0 0  Total 57 5 0 0 0 0 0  Desaturation threshold setting: 4% Minimum desaturation setting: 10 seconds SaO2 nadir: 61% The longest event was a 40 sec central Apnea with a minimum SaO2 of 93%. The lowest SaO2 was 89% associated with a 17 sec obstructive Apnea. EKG Rates EKG Avg Max Min  Awake 76 112 59  Asleep 74 96 61    Awakening/Arousal Information # of Awakenings 59  Wake after sleep onset 184.59m  Wake after persistent sleep 166.13m   Arousal Assoc. Arousals Index  Apneas 67 16.1  Hypopneas 4 1.0  Leg Movements 0 0.0  Snore 0.0 0.0  PTT Arousals 0 0.0  Spontaneous 133 31.9  Total 204 49.0  Myoclonus Information PLMS LMs Index  Total LMs during PLMS 0 0.0  LMs w/ Microarousals 0 0.0   LM LMs Index  w/ Microarousal 0 0.0  w/ Awakening 0 0.0  w/ Resp Event 0 0.0  Spontaneous 0 0.0  Total 0 0.0     [facility] CPAP/Bilevel Report    General Information  Name: Durwin, Heyman BMI: 21 Physician: ,   ID: [id number] Height: 74 in Technician: Domingo Cocking  Sex: Male Weight: 167 lb Record: [id number]  Age: 22 [01-Dec-1955] Date: 09/03/2023 Scorer: Domingo Cocking   Recommended Settings IPAP: N/A cmH20 EPAP: N/A cmH2O AHI: N/A AHI (4%): N/A   Pressure IPAP/EPAP 00 10 / 05 15 / 05 15 / 06 12 / 07 12 / 07 14 / 09   O2 Vol 0.0 0.0 0.0 0.0 0.0 0.0 0.0  Time TRT 0.61m 16.30m 32.51m 319.57m 28.34m 12.29m 35.62m   TST 0.72m 5.6m 28.25m 156.13m 23.27m 10.13m 27.45m  Sleep Stage % Wake 0.0 41.2 13.8 49.6 17.5 12.5 22.9   % REM 0.0 0.0 0.0 3.5 0.0 0.0 0.0   %  N1 0.0 90.0 10.7 28.5 29.8 52.4 29.6   % N2 0.0 10.0 89.3 67.9 70.2 47.6 70.4   % N3 0.0 0.0 0.0 0.0 0.0 0.0 0.0  Respiratory Total Events 0 5 13 13 20 7 23    Obs. Apn. 0 0 4 2 2  0 0   Mixed Apn. 0 0 1 0 0 2 0   Cen. Apn. 0 4 7 11 18 5 21    Hypopneas 0 1 1 0 0 0 2   AHI 0.00 60.00 27.86 5.00 51.06 40.00 51.11   Supine AHI 0.00 60.00 27.86 2.31 51.06 40.00 51.11   Prone AHI 0.00 0.00 0.00 0.00 0.00 0.00 0.00   Side AHI 0.00 0.00 0.00 7.69 0.00 0.00 0.00  Respiratory (4%) Hypopneas (4%) 0.00 0.00 0.00 0.00 0.00 0.00 0.00   AHI (  4%) 0.00 48.00 25.71 5.00 51.06 40.00 46.67   Supine AHI (4%) 0.00 48.00 25.71 2.31 51.06 40.00 46.67   Prone AHI (4%) 0.00 0.00 0.00 0.00 0.00 0.00 0.00   Side AHI (4%) 0.00 0.00 0.00 7.69 0.00 0.00 0.00  Desat Profile <= 90% 0.4m 0.85m 0.35m 5.36m 0.57m 0.12m 0.46m   <= 80% 0.21m 0.21m 0.66m 2.32m 0.57m 0.59m 0.48m   <= 70% 0.72m 0.22m 0.61m 2.68m 0.29m 0.10m 0.71m   <= 60% 0.40m 0.34m 0.59m 1.30m 0.37m 0.69m 0.36m  Arousal Index Apnea 0.0 36.0 21.4 4.6 40.9 40.0 42.2   Hypopnea 0.0 12.0 2.1 0.0 0.0 0.0 4.4   LM 0.0 0.0 0.0 0.0 0.0 0.0 0.0   Spontaneous 0.0 24.0 62.1 22.3 30.6 40.0 55.6

## 2023-09-08 NOTE — Progress Notes (Signed)
Summary of ASV Titration: Apnea was well controlled at ASV settings of : PS max15/ PS min 6/with 3 cm water EPAP, with a residual AHI of 1.8/h.  (A further increase to 15/6/5 cm water final pressure resulted in an AHI of 2.4/h. ) The used interface was a Simplus FFM in large size.  The patient will benefit from using a sleep aid to get enough rest. I recommend to continue his sleep aid prescription.   RV after 30-60 days on therapy with ASV.

## 2023-09-11 ENCOUNTER — Telehealth: Payer: Self-pay

## 2023-09-11 NOTE — Telephone Encounter (Signed)
I called pt. I advised pt that Dr. Vickey Huger reviewed their sleep study results and found that ASV controlled pt apnea well. Dr. Vickey Huger recommends that pt starts ASV. I reviewed PAP compliance expectations with the pt. Pt is agreeable to starting a CPAP. I advised pt that an order will be sent to a DME, [facility], and [facility] will call the pt within about one week after they file with the pt's insurance. [facility] will show the pt how to use the machine, fit for masks, and troubleshoot the CPAP if needed. A follow up appt was made for insurance purposes with MM 2/19 1130. Pt verbalized understanding to arrive 15 minutes early and bring their CPAP. Pt verbalized understanding of results. Pt had no questions at this time but was encouraged to call back if questions arise. I have sent the order to [facility] and have received confirmation that they have received the order.

## 2023-09-11 NOTE — Telephone Encounter (Signed)
Tailynn Armetta, Abbe Amsterdam, CMA  [place], Terri; [place], [place]; Delia Chimes L New orders have been placed for the above pt, DOB: 11/13/1955 Thanks  Coltrane, Terri  Bobbye Morton, CMA; Arvella Merles; Pernell Dupre, Melissa L printed  thanks

## 2023-09-11 NOTE — Telephone Encounter (Signed)
-----   Message from Palmer Dohmeier sent at 09/08/2023  3:32 PM EST ----- Apnea was well controlled at ASV settings of : PS max15/ PS min 6/with 3 cm water EPAP, with a residual AHI of 1.8/h.  (A further increase to 15/6/5 cm water final pressure resulted in an AHI of 2.4/h. ) The used interface was a Simplus FFM in large size. I placed the DME order.  The patient will benefit from using a sleep aid to get enough rest. I recommend to continue his sleep aid prescription.   RV after 30-60 days on therapy with ASV.

## 2023-09-11 NOTE — Telephone Encounter (Signed)
I spoke with Gerre Pebbles and he informed me he is in Hospice so he said I don't think I need that vaccine. I will let Dr Leone Payor know.

## 2023-09-11 NOTE — Telephone Encounter (Signed)
-----   Message from [employer] Gordon J sent at 03/24/2023  1:02 PM EDT ----- Set up his Hep A #2 vaccine for early Dec. First one given 03/24/2023. Dr CG patient, order in epic.

## 2023-09-12 ENCOUNTER — Other Ambulatory Visit (HOSPITAL_COMMUNITY): Payer: Self-pay

## 2023-09-12 ENCOUNTER — Other Ambulatory Visit: Payer: Self-pay

## 2023-09-12 ENCOUNTER — Other Ambulatory Visit (HOSPITAL_COMMUNITY): Payer: Self-pay | Admitting: Internal Medicine

## 2023-09-12 MED ORDER — VYNDAMAX 61 MG PO CAPS
1.0000 | ORAL_CAPSULE | Freq: Every day | ORAL | 3 refills | Status: DC
  Filled 2023-09-12: qty 90, 90d supply, fill #0
  Filled 2023-12-22: qty 90, 90d supply, fill #1

## 2023-09-12 NOTE — Progress Notes (Signed)
Specialty Pharmacy Refill Coordination Note  Frank Berg is a 67 y.o. male contacted today regarding refills of specialty medication(s) Tafamidis   Patient requested Delivery   Delivery date: 09/15/23   Verified address: [address] [place] [address]   Medication will be filled on 09/14/23. *pending refill request*

## 2023-09-14 ENCOUNTER — Telehealth: Payer: Self-pay | Admitting: Adult Health

## 2023-09-14 MED ORDER — LORAZEPAM 1 MG PO TABS: 1.0000 mg | ORAL_TABLET | Freq: Every evening | ORAL | 0 refills | Status: DC | PRN

## 2023-09-14 NOTE — Telephone Encounter (Signed)
At 12:41 [facility]  left a vm stating that a request for a refill was [facility], will not be processed because LORazepam (ATIVAN) 1 MG tablet is not a specialty medication.  Frank Berg stated if this is needing to be sent thru [facility]  a call will need to be made to [phone] reference# [phone]

## 2023-09-14 NOTE — Telephone Encounter (Signed)
This was ordered by Dr Vickey Huger after reviewing

## 2023-09-14 NOTE — Telephone Encounter (Signed)
Dr Dohmeier prescribed this on 09/08/23. Will need to send to local pharmacy on pt's list. Will see if work-in provider will approve.   Spoke with pt [facility] [address] He is aware Dr Vickey Huger is out of the office so we have to see if work-in provider will approve.

## 2023-09-25 NOTE — Telephone Encounter (Signed)
Thank you for your refill on 09-14-2023 of Klonopin.

## 2023-10-04 ENCOUNTER — Other Ambulatory Visit (HOSPITAL_COMMUNITY): Payer: Medicare HMO

## 2023-10-04 NOTE — Progress Notes (Incomplete)
***In Progress***   Advanced Heart Failure Clinic Note   Primary Care: Adrian Prince, MD Primary Cardiologist: Lance Muss, MD HF Cardiologist: Dr. Gala Romney [facility]: Dr Cherly Hensen  HPI:  Mr. Venteicher is a 67 y.o. male referred by Dr. Eldridge Dace for further evaluation of his HF.    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 Also has h/o AF s/p AF ablation at [facility] in 2017. CAD s/p previous stent (2008) followed by LIMA to LAD at time of AVR. TTR Amyloid/    Admitted in 4/22 with near syncope. EF 45% by echo. RV mildly HK. RVSP    R/L cath 5/22: mLAD 90% (patent LIMA to LAD), LCX and RCA minimal CAD.  RA 9 RV 24/0 PA 25/8 (15) PCWP 5 Fick 3.4/1.6   We saw him for the first time on 08/23/21 for AHF consult. W/u revealed severe TTR cardiac amyloidosis. Started on tafamadis. CPX with marked HF limitation.  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 Diuresed 14 pounds. D/c weight 171 lbs. Started on torsemide 40 mg but hadn't taken it yet.   Echo 12/17/21: EF 45-50% severe LVH RV mod HK.   Follow up 12/2021, volume increasing, torsemide 40 mg daily started.    Transferred his care to [facility]. Started on Amvuttra in 01/2022 by Neurology   Then moved to CT and was seen at [facility]. CPX test in 03/2022 showed pVO2 9.8 but apparently didn't reach AT so felt to be submax test. No RER or VE/VCO2 slope reported. Got dose of Amvuttra on 08/23/22 at [place]. Now moved back to [facility]  Saw Dr. Gala Romney on 06/22/23 at the request of Palliative Care. Said he was feeling ok. Functional capacity limited but able to do ADLs. Scheduled for paracentesis. Decision was made to resume Vyndamax and Amvuttra as patient still had acceptable QoL. Had previously been held as patient was noted to be end stage.   Today he returns to HF clinic for Amvuttra administration. Patient doing well today. No contraindications to injection. Injection administered  in *** arm.   .  Assessment/Plan: 1. 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 RVSP with question of mild D-shaped septum - R/L cath 02/2021: mLAD 90% (patent LIMA to LAD), LCX and RCA minimal CAD.  RA 9 RV 24/0 PA 25/8 (15) PCWP 5 Fick 3.4/1.6CPX 09/07/21 - CPX 08/2021: pVO2 10.4 VE/VCO2 slope:  48, pRER: 1.09  - cMRI 09/22/21: EF 40% RVEF 46%  diffuses LGE and markedly elevated ECW ~71%. degenerative appearing Bioprosthetic AVR with mild AI. Reviewed with imaging team and felt to be c/w advanced cardiac amyloidosis. - PYP 10/2020 markedly positive. 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 Diuresed 14 pounds - CPX confirms severe HF limitation - Echo 02/2022 EF 38% at [facility]  - Now end-stage NYHA III-IV.  - Has been off bb for some time.  - Continue Jardiance 10 mg daily. - Continue spironolactone 12.5 mg daily.  - Continue midodrine 10 mg twice a day - Not candidate for advanced therapies with cachexia and CKD -Long discussion with Dr. Gala Romney last visit regarding his situation. He is end-stage but still has acceptable QoL so Dr. Gala Romney recommended continuing tafamadis and Amvuttra to reduce risk of progression. Continue Palliative  Care support. If symptoms progress can switch to Comfort Care as needed - Remains DNR/DNI would not want heroic measures in setting of respiratory or cardaic arrest    2. 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 Patient tolerated injection well. Provided patient counseling on Amvuttra. Most common side effects are injection site reactions, arthralgias and dyspnea. Patient is aware to return to clinic every 3 months for  repeat injection.  - Restart vitamin A supplement 8000 IU daily. Amvuttra decreases serum vitamin A levels.   Follow up 3 months for repeat injection.   Karle Plumber, PharmD, BCPS, BCCP, CPP Heart Failure Clinic Pharmacist [phone]

## 2023-10-05 ENCOUNTER — Other Ambulatory Visit (HOSPITAL_COMMUNITY): Payer: Medicare HMO

## 2023-10-05 NOTE — Progress Notes (Signed)
Advanced Heart Failure Clinic Note   Primary Care: Adrian Prince, MD Primary Cardiologist: Lance Muss, MD HF Cardiologist: Dr. Gala Romney [facility]: Dr Cherly Hensen  HPI:  [name] is a 67 y.o. male referred by Dr. Eldridge Dace for further evaluation of his HF.    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 Also has h/o AF s/p AF ablation at [facility] in 2017. CAD s/p previous stent (2008) followed by LIMA to LAD at time of AVR. TTR Amyloid/    Admitted in 4/22 with near syncope. EF 45% by echo. RV mildly HK. RVSP    R/L cath 5/22: mLAD 90% (patent LIMA to LAD), LCX and RCA minimal CAD.  RA 9 RV 24/0 PA 25/8 (15) PCWP 5 Fick 3.4/1.6   We saw him for the first time on 08/23/21 for AHF consult. W/u revealed severe TTR cardiac amyloidosis. Started on tafamadis. CPX with marked HF limitation.  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 Diuresed 14 pounds. D/c weight 171 lbs. Started on torsemide 40 mg but hadn't taken it yet.   Echo 12/17/21: EF 45-50% severe LVH RV mod HK.   Follow up 12/2021, volume increasing, torsemide 40 mg daily started.    Transferred his care to [facility]. Started on Amvuttra in 01/2022 by Neurology   Then moved to CT and was seen at [facility]. CPX test in 03/2022 showed pVO2 9.8 but apparently didn't reach AT so felt to be submax test. No RER or VE/VCO2 slope reported. Got dose of Amvuttra on 08/23/22 at [place]. Now moved back to [facility]  Saw Dr. Gala Romney on 06/22/23 at the request of Palliative Care. Said he was feeling ok. Functional capacity limited but able to do ADLs. Scheduled for paracentesis. Decision was made to resume Vyndamax and Amvuttra as patient still had acceptable QoL. Had previously been held as patient was noted to be end stage.   Today he returns to HF clinic for Amvuttra administration. Patient doing well today. No contraindications to injection. Injection administered in L arm. Did note  loss of appetite and some nausea after last injection.   Assessment/Plan: 1. 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 RVSP with question of mild D-shaped septum - R/L cath 02/2021: mLAD 90% (patent LIMA to LAD), LCX and RCA minimal CAD.  RA 9 RV 24/0 PA 25/8 (15) PCWP 5 Fick 3.4/1.6CPX 09/07/21 - CPX 08/2021: pVO2 10.4 VE/VCO2 slope:  48, pRER: 1.09  - cMRI 09/22/21: EF 40% RVEF 46%  diffuses LGE and markedly elevated ECW ~71%. degenerative appearing Bioprosthetic AVR with mild AI. Reviewed with imaging team and felt to be c/w advanced cardiac amyloidosis. - PYP 10/2020 markedly positive. 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 Diuresed 14 pounds - CPX confirms severe HF limitation - Echo 02/2022 EF 38% at [facility]  - Now end-stage NYHA III-IV.  - Continue midodrine 10 mg twice a day - Not candidate for advanced therapies with cachexia and CKD -Long discussion with Dr. Gala Romney last visit regarding his situation. He is end-stage but still has acceptable QoL so Dr. Gala Romney recommended continuing tafamadis and Amvuttra to reduce risk of progression. Continue Palliative Care support. If symptoms progress can switch to Comfort Care as needed - Remains DNR/DNI would not  want heroic measures in setting of respiratory or cardaic arrest    2. 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 Patient tolerated injection well. Provided patient counseling on Amvuttra. Most common side effects are injection site reactions, arthralgias and dyspnea. Patient is aware to return to clinic every 3 months for repeat injection.  - Continue vitamin A supplement 8000 IU daily. Amvuttra  decreases serum vitamin A levels.   Follow up 3 months for repeat injection.   Karle Plumber, PharmD, BCPS, BCCP, CPP Heart Failure Clinic Pharmacist [phone]

## 2023-10-05 NOTE — Progress Notes (Incomplete)
***In Progress***   Advanced Heart Failure Clinic Note   Primary Care: Adrian Prince, MD Primary Cardiologist: Lance Muss, MD HF Cardiologist: Dr. Gala Romney [facility]: Dr Cherly Hensen  HPI:  Mr. Venteicher is a 67 y.o. male referred by Dr. Eldridge Dace for further evaluation of his HF.    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 Also has h/o AF s/p AF ablation at [facility] in 2017. CAD s/p previous stent (2008) followed by LIMA to LAD at time of AVR. TTR Amyloid/    Admitted in 4/22 with near syncope. EF 45% by echo. RV mildly HK. RVSP    R/L cath 5/22: mLAD 90% (patent LIMA to LAD), LCX and RCA minimal CAD.  RA 9 RV 24/0 PA 25/8 (15) PCWP 5 Fick 3.4/1.6   We saw him for the first time on 08/23/21 for AHF consult. W/u revealed severe TTR cardiac amyloidosis. Started on tafamadis. CPX with marked HF limitation.  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 Diuresed 14 pounds. D/c weight 171 lbs. Started on torsemide 40 mg but hadn't taken it yet.   Echo 12/17/21: EF 45-50% severe LVH RV mod HK.   Follow up 12/2021, volume increasing, torsemide 40 mg daily started.    Transferred his care to [facility]. Started on Amvuttra in 01/2022 by Neurology   Then moved to CT and was seen at [facility]. CPX test in 03/2022 showed pVO2 9.8 but apparently didn't reach AT so felt to be submax test. No RER or VE/VCO2 slope reported. Got dose of Amvuttra on 08/23/22 at [place]. Now moved back to [facility]  Saw Dr. Gala Romney on 06/22/23 at the request of Palliative Care. Said he was feeling ok. Functional capacity limited but able to do ADLs. Scheduled for paracentesis. Decision was made to resume Vyndamax and Amvuttra as patient still had acceptable QoL. Had previously been held as patient was noted to be end stage.   Today he returns to HF clinic for Amvuttra administration. Patient doing well today. No contraindications to injection. Injection administered  in *** arm.   .  Assessment/Plan: 1. 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 RVSP with question of mild D-shaped septum - R/L cath 02/2021: mLAD 90% (patent LIMA to LAD), LCX and RCA minimal CAD.  RA 9 RV 24/0 PA 25/8 (15) PCWP 5 Fick 3.4/1.6CPX 09/07/21 - CPX 08/2021: pVO2 10.4 VE/VCO2 slope:  48, pRER: 1.09  - cMRI 09/22/21: EF 40% RVEF 46%  diffuses LGE and markedly elevated ECW ~71%. degenerative appearing Bioprosthetic AVR with mild AI. Reviewed with imaging team and felt to be c/w advanced cardiac amyloidosis. - PYP 10/2020 markedly positive. 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 Diuresed 14 pounds - CPX confirms severe HF limitation - Echo 02/2022 EF 38% at [facility]  - Now end-stage NYHA III-IV.  - Has been off bb for some time.  - Continue Jardiance 10 mg daily. - Continue spironolactone 12.5 mg daily.  - Continue midodrine 10 mg twice a day - Not candidate for advanced therapies with cachexia and CKD -Long discussion with Dr. Gala Romney last visit regarding his situation. He is end-stage but still has acceptable QoL so Dr. Gala Romney recommended continuing tafamadis and Amvuttra to reduce risk of progression. Continue Palliative  Care support. If symptoms progress can switch to Comfort Care as needed - Remains DNR/DNI would not want heroic measures in setting of respiratory or cardaic arrest    2. 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 Patient tolerated injection well. Provided patient counseling on Amvuttra. Most common side effects are injection site reactions, arthralgias and dyspnea. Patient is aware to return to clinic every 3 months for  repeat injection.  - Restart vitamin A supplement 8000 IU daily. Amvuttra decreases serum vitamin A levels.   Follow up 3 months for repeat injection.   Karle Plumber, PharmD, BCPS, BCCP, CPP Heart Failure Clinic Pharmacist [phone]

## 2023-10-09 ENCOUNTER — Ambulatory Visit (HOSPITAL_COMMUNITY)
Admission: RE | Admit: 2023-10-09 | Discharge: 2023-10-09 | Disposition: A | Discharge status: Home / Self Care | Source: Ambulatory Visit | Attending: Cardiology | Admitting: Cardiology

## 2023-10-09 DIAGNOSIS — I43 Cardiomyopathy in diseases classified elsewhere: Secondary | ICD-10-CM | POA: Diagnosis not present

## 2023-10-09 DIAGNOSIS — G63 Polyneuropathy in diseases classified elsewhere: Secondary | ICD-10-CM | POA: Diagnosis not present

## 2023-10-09 DIAGNOSIS — I2510 Atherosclerotic heart disease of native coronary artery without angina pectoris: Secondary | ICD-10-CM | POA: Diagnosis not present

## 2023-10-09 DIAGNOSIS — E854 Organ-limited amyloidosis: Secondary | ICD-10-CM | POA: Insufficient documentation

## 2023-10-09 DIAGNOSIS — Z951 Presence of aortocoronary bypass graft: Secondary | ICD-10-CM | POA: Diagnosis not present

## 2023-10-09 DIAGNOSIS — I5022 Chronic systolic (congestive) heart failure: Secondary | ICD-10-CM | POA: Diagnosis present

## 2023-10-09 DIAGNOSIS — G4733 Obstructive sleep apnea (adult) (pediatric): Secondary | ICD-10-CM | POA: Insufficient documentation

## 2023-10-09 DIAGNOSIS — E851 Neuropathic heredofamilial amyloidosis: Secondary | ICD-10-CM

## 2023-10-09 DIAGNOSIS — E119 Type 2 diabetes mellitus without complications: Secondary | ICD-10-CM | POA: Diagnosis not present

## 2023-10-09 MED ORDER — VUTRISIRAN SODIUM 25 MG/0.5ML ~~LOC~~ SOSY
25.0000 mg | PREFILLED_SYRINGE | Freq: Once | SUBCUTANEOUS | Status: AC
  Administered 2023-10-09: 25 mg via SUBCUTANEOUS
  Filled 2023-10-09: qty 0.5

## 2023-11-20 ENCOUNTER — Other Ambulatory Visit (HOSPITAL_COMMUNITY): Payer: Self-pay

## 2023-11-20 ENCOUNTER — Telehealth (HOSPITAL_COMMUNITY): Payer: Self-pay | Admitting: Pharmacist

## 2023-11-20 NOTE — Telephone Encounter (Addendum)
Advanced Heart Failure Patient Advocate Encounter  Prior Authorization for Amvuttra has been approved though the pharmacy benefit. CMM Key: [id number].  Effective dates: 10/25/2023 - 10/23/2024  Copay: $0.00  Karle Plumber, PharmD, BCPS, BCCP, CPP Heart Failure Clinic Pharmacist [phone]

## 2023-11-28 ENCOUNTER — Other Ambulatory Visit: Payer: Self-pay

## 2023-12-01 ENCOUNTER — Other Ambulatory Visit: Payer: Self-pay

## 2023-12-05 ENCOUNTER — Other Ambulatory Visit (HOSPITAL_COMMUNITY): Payer: Self-pay

## 2023-12-07 ENCOUNTER — Other Ambulatory Visit (HOSPITAL_COMMUNITY): Payer: Self-pay

## 2023-12-13 ENCOUNTER — Telehealth: Payer: Medicare HMO | Admitting: Adult Health

## 2023-12-18 ENCOUNTER — Telehealth: Payer: Self-pay

## 2023-12-18 NOTE — Telephone Encounter (Signed)
-----   Message from Nurse Joselyn Glassman sent at 07/18/2023  7:59 AM EDT ----- Personal reminder placed on 07/18/2023.    RE: Repeat MRI Received: Thurston Pounds, MD  Jenel Lucks, MD; Emeline Darling, RN Viviann Spare you can place a reminder for 5 months and send to me for advice then.  He is quite ill and end-stage  CEG     Previous Messages    ----- Message ----- From: Jenel Lucks, MD Sent: 07/16/2023  10:18 AM EDT To: [name], MD; Emeline Darling, RN Subject: Repeat MRI                                    CG,  Mr. Coster was admitted with upper GI bleed, CT showed liver lesion.  MRI showed Li- RADS 3, recommend repeat MRI in 6 months.  Although this may be best followed up through [facility], I figured it would be easiest to arrange through [place]. Also certainly reasonable to not follow this lesion given his significant comorbidities, unlikely chance he would be a candidate for any HCC treatment.  UGIB managed conservatively.  No scope performed

## 2023-12-18 NOTE — Telephone Encounter (Signed)
 Personal reminder received in Epic. Please see note below

## 2023-12-20 ENCOUNTER — Other Ambulatory Visit (HOSPITAL_COMMUNITY): Payer: Self-pay

## 2023-12-22 ENCOUNTER — Other Ambulatory Visit: Payer: Self-pay

## 2023-12-22 ENCOUNTER — Encounter (HOSPITAL_COMMUNITY): Payer: Self-pay

## 2023-12-22 NOTE — Progress Notes (Signed)
 Specialty Pharmacy Refill Coordination Note  Frank Berg is a 68 y.o. male contacted today regarding refills of specialty medication(s) Tafamidis [name])   Patient requested Delivery   Delivery date: 12/26/23   Verified address: [address]  [name] [id number]   Medication will be filled on 12/25/23.

## 2023-12-22 NOTE — Progress Notes (Signed)
 Specialty Pharmacy Ongoing Clinical Assessment Note  Frank Berg is a 68 y.o. male who is being followed by the specialty pharmacy service for RxSp Cardiology   Patient's specialty medication(s) reviewed today: Tafamidis (Vyndamax)   Missed doses in the last 4 weeks: 0   Patient/Caregiver did not have any additional questions or concerns.   Therapeutic benefit summary: Patient is achieving benefit   Adverse events/side effects summary: No adverse events/side effects   Patient's therapy is appropriate to: Continue    Goals Addressed             This Visit's Progress    Stabilization of disease       Patient is on track. Patient will maintain adherence         Follow up:  6 months  Otto Herb Specialty Pharmacist

## 2023-12-24 NOTE — Telephone Encounter (Signed)
 Patient is in hospice.  Will not repeat imaging.  Thanks.

## 2023-12-25 ENCOUNTER — Other Ambulatory Visit: Payer: Self-pay

## 2023-12-29 ENCOUNTER — Other Ambulatory Visit: Payer: Self-pay

## 2024-01-03 ENCOUNTER — Other Ambulatory Visit: Payer: Self-pay

## 2024-01-03 ENCOUNTER — Other Ambulatory Visit (HOSPITAL_COMMUNITY): Payer: Self-pay

## 2024-01-03 NOTE — Progress Notes (Unsigned)
 Specialty Pharmacy Initial Fill Coordination Note  VILIAMI BRACCO is a 68 y.o. male contacted today regarding initial fill of specialty medication(s) Vutrisiran Sodium York Spaniel)   Patient requested Courier to Provider Office   Delivery date: 01/05/24   Verified address[facility] [address] [place] [address]   Medication will be filled on 01/05/2024.   Patient is aware of $0 copayment.

## 2024-01-04 ENCOUNTER — Other Ambulatory Visit (HOSPITAL_COMMUNITY): Payer: Self-pay | Admitting: Pharmacist

## 2024-01-04 ENCOUNTER — Other Ambulatory Visit (HOSPITAL_COMMUNITY): Payer: Self-pay

## 2024-01-04 ENCOUNTER — Other Ambulatory Visit: Payer: Self-pay

## 2024-01-04 ENCOUNTER — Encounter (HOSPITAL_COMMUNITY): Payer: Self-pay

## 2024-01-04 MED ORDER — AMVUTTRA 25 MG/0.5ML ~~LOC~~ SOSY
25.0000 mg | PREFILLED_SYRINGE | SUBCUTANEOUS | 3 refills | Status: DC
  Filled 2024-01-04: qty 0.5, 90d supply, fill #0

## 2024-01-04 NOTE — Progress Notes (Signed)
 Specialty Pharmacy Initiation Note   Frank Berg is a 68 y.o. male who will be followed by the specialty pharmacy service for RxSp Cardiology    Review of administration, indication, effectiveness, safety, potential side effects, storage/disposable, and missed dose instructions occurred today for patient's specialty medication(s) Vutrisiran Sodium (Amvuttra)     Patient/Caregiver did not have any additional questions or concerns.   Patient's therapy is appropriate to: Continue (Has been on amvuttra previously and tolerating well. Medication has previously been billed through medical benefit, will now be billed through pharmacy benefit.)    Goals Addressed             This Visit's Progress    Slow Disease Progression       Patient is on track. Patient will maintain adherence         [name] [place] Specialty Pharmacist

## 2024-01-08 ENCOUNTER — Other Ambulatory Visit: Payer: Self-pay

## 2024-01-09 ENCOUNTER — Other Ambulatory Visit: Payer: Self-pay

## 2024-01-09 ENCOUNTER — Other Ambulatory Visit (HOSPITAL_COMMUNITY): Payer: Self-pay

## 2024-01-10 ENCOUNTER — Ambulatory Visit (HOSPITAL_COMMUNITY)
Admission: RE | Admit: 2024-01-10 | Discharge: 2024-01-10 | Disposition: A | Discharge status: Home / Self Care | Payer: Medicare HMO | Source: Ambulatory Visit | Attending: Cardiology | Admitting: Cardiology

## 2024-01-10 DIAGNOSIS — E854 Organ-limited amyloidosis: Secondary | ICD-10-CM | POA: Insufficient documentation

## 2024-01-10 DIAGNOSIS — I2510 Atherosclerotic heart disease of native coronary artery without angina pectoris: Secondary | ICD-10-CM | POA: Diagnosis not present

## 2024-01-10 DIAGNOSIS — I43 Cardiomyopathy in diseases classified elsewhere: Secondary | ICD-10-CM | POA: Insufficient documentation

## 2024-01-10 DIAGNOSIS — I5022 Chronic systolic (congestive) heart failure: Secondary | ICD-10-CM | POA: Insufficient documentation

## 2024-01-10 DIAGNOSIS — E851 Neuropathic heredofamilial amyloidosis: Secondary | ICD-10-CM | POA: Insufficient documentation

## 2024-01-10 DIAGNOSIS — G63 Polyneuropathy in diseases classified elsewhere: Secondary | ICD-10-CM | POA: Insufficient documentation

## 2024-01-10 MED ORDER — VUTRISIRAN SODIUM 25 MG/0.5ML ~~LOC~~ SOSY
25.0000 mg | PREFILLED_SYRINGE | Freq: Once | SUBCUTANEOUS | Status: AC
  Administered 2024-01-10: 25 mg via SUBCUTANEOUS

## 2024-01-10 NOTE — Patient Instructions (Signed)
 It was a pleasure seeing you today!  MEDICATIONS: -No medication changes today -Call if you have questions about your medications.   NEXT APPOINTMENT: Return to clinic in 3 months for repeat Amvuttra.  In general, to take care of your heart failure: -Limit your fluid intake to 2 Liters (half-gallon) per day.   -Limit your salt intake to ideally 2-3 grams (2000-3000 mg) per day. -Weigh yourself daily and record, and bring that "weight diary" to your next appointment.  (Weight gain of 2-3 pounds in 1 day typically means fluid weight.) -The medications for your heart are to help your heart and help you live longer.   -Please contact us before stopping any of your heart medications.  Call the clinic at [phone] with questions or to reschedule future appointments.

## 2024-01-10 NOTE — Progress Notes (Signed)
 Advanced Heart Failure Clinic Note   Primary Care: Adrian Prince, MD Primary Cardiologist: Lance Muss, MD HF Cardiologist: Dr. Gala Romney [facility]: Dr Cherly Hensen  HPI:  Frank Berg is a 68 y.o. male referred by Dr. Eldridge Dace for further evaluation of his HF.    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 Also has h/o AF s/p AF ablation at [facility] in 2017. CAD s/p previous stent (2008) followed by LIMA to LAD at time of AVR. TTR Amyloid/    Admitted in 4/22 with near syncope. EF 45% by echo. RV mildly HK. RVSP    R/L cath 5/22: mLAD 90% (patent LIMA to LAD), LCX and RCA minimal CAD.  RA 9 RV 24/0 PA 25/8 (15) PCWP 5 Fick 3.4/1.6   We saw him for the first time on 08/23/21 for AHF consult. W/u revealed severe TTR cardiac amyloidosis. Started on tafamadis. CPX with marked HF limitation.  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 Diuresed 14 pounds. D/c weight 171 lbs. Started on torsemide 40 mg but hadn't taken it yet.   Echo 12/17/21: EF 45-50% severe LVH RV mod HK.   Follow up 12/2021, volume increasing, torsemide 40 mg daily started.    Transferred his care to [facility]. Started on Amvuttra in 01/2022 by Neurology   Then moved to CT and was seen at [facility]. CPX test in 03/2022 showed pVO2 9.8 but apparently didn't reach AT so felt to be submax test. No RER or VE/VCO2 slope reported. Got dose of Amvuttra on 08/23/22 at [place]. Now moved back to [facility]  Saw Dr. Gala Romney on 06/22/23 at the request of Palliative Care. Said he was feeling ok. Functional capacity limited but able to do ADLs. Scheduled for paracentesis. Decision was made to resume Vyndamax and Amvuttra as patient still had acceptable QoL. Had previously been held as patient was noted to be end stage.   Today he returns to HF clinic for Amvuttra administration. Patient doing well today. No contraindications to injection. Injection administered in L  arm.  Assessment/Plan: 1. 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 RVSP with question of mild D-shaped septum - R/L cath 02/2021: mLAD 90% (patent LIMA to LAD), LCX and RCA minimal CAD.  RA 9 RV 24/0 PA 25/8 (15) PCWP 5 Fick 3.4/1.6CPX 09/07/21 - CPX 08/2021: pVO2 10.4 VE/VCO2 slope:  48, pRER: 1.09  - cMRI 09/22/21: EF 40% RVEF 46%  diffuses LGE and markedly elevated ECW ~71%. degenerative appearing Bioprosthetic AVR with mild AI. Reviewed with imaging team and felt to be c/w advanced cardiac amyloidosis. - PYP 10/2020 markedly positive. 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 Diuresed 14 pounds - CPX confirms severe HF limitation - Echo 02/2022 EF 38% at [facility]  - Now end-stage NYHA III-IV.  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 Gala Romney last visit regarding his situation. He is end-stage but still has acceptable QoL so Dr. Gala Romney recommended continuing tafamadis and Amvuttra to reduce risk of progression. Continue Palliative Care support. If symptoms progress can switch to Comfort Care as needed - Remains DNR/DNI would not want heroic measures in setting of respiratory or cardaic arrest  2. 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 Patient tolerated injection well. Provided patient counseling on Amvuttra. Most common side effects are injection site reactions, arthralgias and dyspnea. Patient is aware to return to clinic every 3 months for repeat injection.  - Continue vitamin A supplement 8000 IU daily. Amvuttra decreases serum vitamin A levels.   Follow up  3 months for repeat injection.   Karle Plumber, PharmD, BCPS, BCCP, CPP Heart Failure Clinic Pharmacist [phone]

## 2024-01-25 ENCOUNTER — Other Ambulatory Visit (HOSPITAL_COMMUNITY): Payer: Self-pay

## 2024-02-14 ENCOUNTER — Encounter: Payer: Self-pay | Admitting: Neurology

## 2024-03-06 ENCOUNTER — Other Ambulatory Visit (HOSPITAL_COMMUNITY): Payer: Self-pay

## 2024-03-08 ENCOUNTER — Other Ambulatory Visit: Payer: Self-pay

## 2024-03-11 ENCOUNTER — Other Ambulatory Visit: Payer: Self-pay

## 2024-04-10 ENCOUNTER — Other Ambulatory Visit (HOSPITAL_COMMUNITY)

## 2024-04-23 DEATH — deceased

## 2024-07-18 ENCOUNTER — Other Ambulatory Visit (HOSPITAL_COMMUNITY): Payer: Self-pay
